# Patient Record
Sex: Female | Born: 1940
Health system: Southern US, Community
[De-identification: ages and names within clinical notes are randomized; demographics above are authoritative.]

## PROBLEM LIST (undated history)

## (undated) DIAGNOSIS — B373 Candidiasis of vulva and vagina: Secondary | ICD-10-CM

## (undated) DIAGNOSIS — G971 Other reaction to spinal and lumbar puncture: Secondary | ICD-10-CM

## (undated) DIAGNOSIS — M797 Fibromyalgia: Secondary | ICD-10-CM

## (undated) DIAGNOSIS — T7840XA Allergy, unspecified, initial encounter: Secondary | ICD-10-CM

## (undated) DIAGNOSIS — M549 Dorsalgia, unspecified: Secondary | ICD-10-CM

## (undated) DIAGNOSIS — Z8489 Family history of other specified conditions: Secondary | ICD-10-CM

## (undated) DIAGNOSIS — R351 Nocturia: Secondary | ICD-10-CM

## (undated) DIAGNOSIS — R011 Cardiac murmur, unspecified: Secondary | ICD-10-CM

## (undated) DIAGNOSIS — K219 Gastro-esophageal reflux disease without esophagitis: Secondary | ICD-10-CM

## (undated) DIAGNOSIS — E785 Hyperlipidemia, unspecified: Secondary | ICD-10-CM

## (undated) DIAGNOSIS — C189 Malignant neoplasm of colon, unspecified: Secondary | ICD-10-CM

## (undated) DIAGNOSIS — H269 Unspecified cataract: Secondary | ICD-10-CM

## (undated) DIAGNOSIS — R0602 Shortness of breath: Secondary | ICD-10-CM

## (undated) DIAGNOSIS — R350 Frequency of micturition: Secondary | ICD-10-CM

## (undated) DIAGNOSIS — J209 Acute bronchitis, unspecified: Secondary | ICD-10-CM

## (undated) DIAGNOSIS — Z8619 Personal history of other infectious and parasitic diseases: Secondary | ICD-10-CM

## (undated) DIAGNOSIS — Z86718 Personal history of other venous thrombosis and embolism: Secondary | ICD-10-CM

## (undated) DIAGNOSIS — R0600 Dyspnea, unspecified: Secondary | ICD-10-CM

## (undated) DIAGNOSIS — Z860100 Personal history of colon polyps, unspecified: Secondary | ICD-10-CM

## (undated) DIAGNOSIS — Z800 Family history of malignant neoplasm of digestive organs: Secondary | ICD-10-CM

## (undated) DIAGNOSIS — R200 Anesthesia of skin: Secondary | ICD-10-CM

## (undated) DIAGNOSIS — G4730 Sleep apnea, unspecified: Secondary | ICD-10-CM

## (undated) DIAGNOSIS — R600 Localized edema: Secondary | ICD-10-CM

## (undated) DIAGNOSIS — I10 Essential (primary) hypertension: Secondary | ICD-10-CM

## (undated) DIAGNOSIS — K279 Peptic ulcer, site unspecified, unspecified as acute or chronic, without hemorrhage or perforation: Secondary | ICD-10-CM

## (undated) DIAGNOSIS — G8929 Other chronic pain: Secondary | ICD-10-CM

## (undated) DIAGNOSIS — Z8679 Personal history of other diseases of the circulatory system: Secondary | ICD-10-CM

## (undated) DIAGNOSIS — I499 Cardiac arrhythmia, unspecified: Secondary | ICD-10-CM

## (undated) DIAGNOSIS — R21 Rash and other nonspecific skin eruption: Secondary | ICD-10-CM

## (undated) DIAGNOSIS — R42 Dizziness and giddiness: Secondary | ICD-10-CM

## (undated) DIAGNOSIS — I4891 Unspecified atrial fibrillation: Secondary | ICD-10-CM

## (undated) DIAGNOSIS — J40 Bronchitis, not specified as acute or chronic: Secondary | ICD-10-CM

## (undated) DIAGNOSIS — Z8669 Personal history of other diseases of the nervous system and sense organs: Secondary | ICD-10-CM

## (undated) DIAGNOSIS — M1990 Unspecified osteoarthritis, unspecified site: Secondary | ICD-10-CM

## (undated) DIAGNOSIS — F419 Anxiety disorder, unspecified: Secondary | ICD-10-CM

## (undated) DIAGNOSIS — R609 Edema, unspecified: Secondary | ICD-10-CM

## (undated) HISTORY — PX: TRIGGER FINGER RELEASE: SHX641

## (undated) HISTORY — PX: GANGLION CYST EXCISION: SHX1691

## (undated) HISTORY — DX: Peptic ulcer, site unspecified, unspecified as acute or chronic, without hemorrhage or perforation: K27.9

## (undated) HISTORY — PX: JOINT REPLACEMENT: SHX530

## (undated) HISTORY — DX: Unspecified atrial fibrillation: I48.91

## (undated) HISTORY — DX: Malignant neoplasm of colon, unspecified: C18.9

## (undated) HISTORY — PX: KNEE SURGERY: SHX244

## (undated) HISTORY — PX: COLONOSCOPY: SHX174

## (undated) HISTORY — PX: OTHER SURGICAL HISTORY: SHX169

## (undated) HISTORY — DX: Allergy, unspecified, initial encounter: T78.40XA

## (undated) HISTORY — DX: Unspecified cataract: H26.9

## (undated) HISTORY — PX: ANKLE SURGERY: SHX546

## (undated) HISTORY — DX: Essential (primary) hypertension: I10

## (undated) HISTORY — DX: Personal history of other diseases of the circulatory system: Z86.79

## (undated) HISTORY — DX: Acute bronchitis, unspecified: J20.9

## (undated) HISTORY — DX: Hyperlipidemia, unspecified: E78.5

## (undated) HISTORY — DX: Unspecified osteoarthritis, unspecified site: M19.90

## (undated) HISTORY — PX: CARDIAC CATHETERIZATION: SHX172

---

## 1970-02-22 HISTORY — PX: TUBAL LIGATION: SHX77

## 1990-02-22 HISTORY — PX: HEEL SPUR SURGERY: SHX665

## 1998-05-20 ENCOUNTER — Emergency Department (HOSPITAL_COMMUNITY): Admission: EM | Admit: 1998-05-20 | Discharge: 1998-05-20 | Payer: Self-pay | Admitting: Emergency Medicine

## 1998-08-18 ENCOUNTER — Encounter: Payer: Self-pay | Admitting: Internal Medicine

## 1998-08-18 ENCOUNTER — Ambulatory Visit (HOSPITAL_COMMUNITY): Admission: RE | Admit: 1998-08-18 | Discharge: 1998-08-18 | Payer: Self-pay | Admitting: Internal Medicine

## 1999-05-08 ENCOUNTER — Encounter: Payer: Self-pay | Admitting: Orthopedic Surgery

## 1999-05-08 ENCOUNTER — Encounter: Admission: RE | Admit: 1999-05-08 | Discharge: 1999-05-08 | Payer: Self-pay | Admitting: Orthopedic Surgery

## 1999-06-09 ENCOUNTER — Encounter: Admission: RE | Admit: 1999-06-09 | Discharge: 1999-06-25 | Payer: Self-pay | Admitting: Orthopedic Surgery

## 1999-10-30 ENCOUNTER — Encounter: Payer: Self-pay | Admitting: General Surgery

## 1999-11-06 ENCOUNTER — Encounter (INDEPENDENT_AMBULATORY_CARE_PROVIDER_SITE_OTHER): Payer: Self-pay | Admitting: Specialist

## 1999-11-06 ENCOUNTER — Inpatient Hospital Stay (HOSPITAL_COMMUNITY): Admission: RE | Admit: 1999-11-06 | Discharge: 1999-11-10 | Payer: Self-pay | Admitting: Orthopedic Surgery

## 1999-12-07 ENCOUNTER — Encounter: Admission: RE | Admit: 1999-12-07 | Discharge: 2000-02-11 | Payer: Self-pay | Admitting: Orthopedic Surgery

## 1999-12-08 ENCOUNTER — Emergency Department (HOSPITAL_COMMUNITY): Admission: EM | Admit: 1999-12-08 | Discharge: 1999-12-08 | Payer: Self-pay | Admitting: Emergency Medicine

## 1999-12-30 ENCOUNTER — Observation Stay (HOSPITAL_COMMUNITY): Admission: RE | Admit: 1999-12-30 | Discharge: 1999-12-31 | Payer: Self-pay | Admitting: Orthopedic Surgery

## 2001-07-03 ENCOUNTER — Emergency Department (HOSPITAL_COMMUNITY): Admission: EM | Admit: 2001-07-03 | Discharge: 2001-07-03 | Payer: Self-pay

## 2001-08-19 ENCOUNTER — Encounter: Admission: RE | Admit: 2001-08-19 | Discharge: 2001-08-19 | Payer: Self-pay | Admitting: Endocrinology

## 2001-08-19 ENCOUNTER — Encounter: Payer: Self-pay | Admitting: Endocrinology

## 2002-04-30 ENCOUNTER — Emergency Department (HOSPITAL_COMMUNITY): Admission: EM | Admit: 2002-04-30 | Discharge: 2002-05-01 | Payer: Self-pay | Admitting: Emergency Medicine

## 2002-07-11 ENCOUNTER — Observation Stay (HOSPITAL_COMMUNITY): Admission: EM | Admit: 2002-07-11 | Discharge: 2002-07-12 | Payer: Self-pay | Admitting: Emergency Medicine

## 2002-07-11 ENCOUNTER — Encounter: Payer: Self-pay | Admitting: Emergency Medicine

## 2002-07-20 ENCOUNTER — Encounter: Payer: Self-pay | Admitting: Internal Medicine

## 2002-07-20 ENCOUNTER — Encounter: Admission: RE | Admit: 2002-07-20 | Discharge: 2002-07-20 | Payer: Self-pay | Admitting: Internal Medicine

## 2002-07-26 ENCOUNTER — Encounter: Payer: Self-pay | Admitting: Internal Medicine

## 2002-07-26 ENCOUNTER — Encounter: Admission: RE | Admit: 2002-07-26 | Discharge: 2002-07-26 | Payer: Self-pay | Admitting: Internal Medicine

## 2003-08-16 ENCOUNTER — Encounter: Payer: Self-pay | Admitting: Cardiovascular Disease

## 2003-08-16 ENCOUNTER — Inpatient Hospital Stay (HOSPITAL_COMMUNITY): Admission: EM | Admit: 2003-08-16 | Discharge: 2003-08-19 | Payer: Self-pay | Admitting: Emergency Medicine

## 2004-02-20 ENCOUNTER — Other Ambulatory Visit: Admission: RE | Admit: 2004-02-20 | Discharge: 2004-02-20 | Payer: Self-pay | Admitting: Obstetrics and Gynecology

## 2004-09-21 ENCOUNTER — Ambulatory Visit: Payer: Self-pay | Admitting: Internal Medicine

## 2004-09-23 ENCOUNTER — Ambulatory Visit: Payer: Self-pay | Admitting: Internal Medicine

## 2004-09-25 ENCOUNTER — Ambulatory Visit: Payer: Self-pay | Admitting: Internal Medicine

## 2004-11-26 ENCOUNTER — Ambulatory Visit: Payer: Self-pay | Admitting: Internal Medicine

## 2005-02-22 DIAGNOSIS — C189 Malignant neoplasm of colon, unspecified: Secondary | ICD-10-CM

## 2005-02-22 HISTORY — PX: COLECTOMY: SHX59

## 2005-02-22 HISTORY — DX: Malignant neoplasm of colon, unspecified: C18.9

## 2005-03-26 ENCOUNTER — Ambulatory Visit: Payer: Self-pay | Admitting: Internal Medicine

## 2005-05-22 ENCOUNTER — Emergency Department (HOSPITAL_COMMUNITY): Admission: EM | Admit: 2005-05-22 | Discharge: 2005-05-22 | Payer: Self-pay | Admitting: Emergency Medicine

## 2005-11-01 ENCOUNTER — Ambulatory Visit: Payer: Self-pay | Admitting: Internal Medicine

## 2005-11-29 ENCOUNTER — Ambulatory Visit: Payer: Self-pay | Admitting: Internal Medicine

## 2005-12-15 ENCOUNTER — Ambulatory Visit: Payer: Self-pay | Admitting: Gastroenterology

## 2005-12-29 ENCOUNTER — Ambulatory Visit: Payer: Self-pay | Admitting: Gastroenterology

## 2005-12-29 ENCOUNTER — Encounter (INDEPENDENT_AMBULATORY_CARE_PROVIDER_SITE_OTHER): Payer: Self-pay | Admitting: Specialist

## 2006-01-10 ENCOUNTER — Ambulatory Visit: Payer: Self-pay | Admitting: Internal Medicine

## 2006-01-20 ENCOUNTER — Ambulatory Visit (HOSPITAL_COMMUNITY): Admission: RE | Admit: 2006-01-20 | Discharge: 2006-01-20 | Payer: Self-pay | Admitting: Orthopedic Surgery

## 2006-01-21 ENCOUNTER — Ambulatory Visit: Payer: Self-pay | Admitting: Internal Medicine

## 2006-02-10 ENCOUNTER — Encounter (INDEPENDENT_AMBULATORY_CARE_PROVIDER_SITE_OTHER): Payer: Self-pay | Admitting: *Deleted

## 2006-02-10 ENCOUNTER — Inpatient Hospital Stay (HOSPITAL_COMMUNITY): Admission: RE | Admit: 2006-02-10 | Discharge: 2006-02-15 | Payer: Self-pay | Admitting: Surgery

## 2006-02-19 ENCOUNTER — Emergency Department (HOSPITAL_COMMUNITY): Admission: EM | Admit: 2006-02-19 | Discharge: 2006-02-19 | Payer: Self-pay | Admitting: Emergency Medicine

## 2006-02-20 ENCOUNTER — Ambulatory Visit: Payer: Self-pay | Admitting: Hematology & Oncology

## 2006-02-22 HISTORY — PX: LUMBAR DISC SURGERY: SHX700

## 2006-02-22 HISTORY — PX: OTHER SURGICAL HISTORY: SHX169

## 2006-03-10 LAB — CBC WITH DIFFERENTIAL/PLATELET
Basophils Absolute: 0 10*3/uL (ref 0.0–0.1)
Eosinophils Absolute: 0.5 10*3/uL (ref 0.0–0.5)
HGB: 13.1 g/dL (ref 11.6–15.9)
MCV: 85.1 fL (ref 81.0–101.0)
MONO#: 0.5 10*3/uL (ref 0.1–0.9)
MONO%: 7.1 % (ref 0.0–13.0)
NEUT#: 3.7 10*3/uL (ref 1.5–6.5)
Platelets: 318 10*3/uL (ref 145–400)
RBC: 4.56 10*6/uL (ref 3.70–5.32)
RDW: 13.5 % (ref 11.3–14.5)
WBC: 7.5 10*3/uL (ref 3.9–10.0)

## 2006-03-10 LAB — COMPREHENSIVE METABOLIC PANEL
Albumin: 4.2 g/dL (ref 3.5–5.2)
Alkaline Phosphatase: 88 U/L (ref 39–117)
BUN: 10 mg/dL (ref 6–23)
CO2: 27 mEq/L (ref 19–32)
Calcium: 9.3 mg/dL (ref 8.4–10.5)
Chloride: 104 mEq/L (ref 96–112)
Glucose, Bld: 125 mg/dL — ABNORMAL HIGH (ref 70–99)
Potassium: 3.8 mEq/L (ref 3.5–5.3)
Sodium: 141 mEq/L (ref 135–145)
Total Protein: 6.4 g/dL (ref 6.0–8.3)

## 2006-03-10 LAB — CEA: CEA: 1.4 ng/mL (ref 0.0–5.0)

## 2006-03-21 ENCOUNTER — Ambulatory Visit (HOSPITAL_COMMUNITY): Admission: RE | Admit: 2006-03-21 | Discharge: 2006-03-21 | Payer: Self-pay | Admitting: Surgery

## 2006-03-24 ENCOUNTER — Ambulatory Visit (HOSPITAL_COMMUNITY): Admission: RE | Admit: 2006-03-24 | Discharge: 2006-03-24 | Payer: Self-pay | Admitting: Hematology & Oncology

## 2006-04-04 ENCOUNTER — Ambulatory Visit: Payer: Self-pay | Admitting: Hematology & Oncology

## 2006-04-06 LAB — CBC WITH DIFFERENTIAL/PLATELET
BASO%: 0.5 % (ref 0.0–2.0)
Eosinophils Absolute: 0.3 10*3/uL (ref 0.0–0.5)
LYMPH%: 31 % (ref 14.0–48.0)
MCHC: 35.4 g/dL (ref 32.0–36.0)
MONO#: 0.5 10*3/uL (ref 0.1–0.9)
NEUT#: 4.5 10*3/uL (ref 1.5–6.5)
Platelets: 249 10*3/uL (ref 145–400)
RBC: 4.31 10*6/uL (ref 3.70–5.32)
RDW: 14.4 % (ref 11.3–14.5)
WBC: 7.8 10*3/uL (ref 3.9–10.0)
lymph#: 2.4 10*3/uL (ref 0.9–3.3)

## 2006-04-06 LAB — COMPREHENSIVE METABOLIC PANEL
ALT: 37 U/L — ABNORMAL HIGH (ref 0–35)
Albumin: 3.8 g/dL (ref 3.5–5.2)
CO2: 25 mEq/L (ref 19–32)
Glucose, Bld: 230 mg/dL — ABNORMAL HIGH (ref 70–99)
Potassium: 3.5 mEq/L (ref 3.5–5.3)
Sodium: 141 mEq/L (ref 135–145)
Total Bilirubin: 0.4 mg/dL (ref 0.3–1.2)
Total Protein: 6 g/dL (ref 6.0–8.3)

## 2006-04-20 LAB — CBC WITH DIFFERENTIAL/PLATELET
BASO%: 1.1 % (ref 0.0–2.0)
LYMPH%: 35.4 % (ref 14.0–48.0)
MCHC: 34.2 g/dL (ref 32.0–36.0)
MCV: 82.8 fL (ref 81.0–101.0)
MONO#: 0.7 10*3/uL (ref 0.1–0.9)
MONO%: 9.8 % (ref 0.0–13.0)
Platelets: 154 10*3/uL (ref 145–400)
RBC: 4.68 10*6/uL (ref 3.70–5.32)
RDW: 13.4 % (ref 11.3–14.5)
WBC: 6.9 10*3/uL (ref 3.9–10.0)

## 2006-05-04 LAB — CBC WITH DIFFERENTIAL/PLATELET
BASO%: 0.8 % (ref 0.0–2.0)
MCHC: 33.3 g/dL (ref 32.0–36.0)
MONO#: 0.6 10*3/uL (ref 0.1–0.9)
NEUT#: 2.8 10*3/uL (ref 1.5–6.5)
RBC: 4.71 10*6/uL (ref 3.70–5.32)
RDW: 15.1 % — ABNORMAL HIGH (ref 11.3–14.5)
WBC: 5.8 10*3/uL (ref 3.9–10.0)
lymph#: 2.2 10*3/uL (ref 0.9–3.3)

## 2006-05-04 LAB — COMPREHENSIVE METABOLIC PANEL
ALT: 45 U/L — ABNORMAL HIGH (ref 0–35)
Albumin: 3.8 g/dL (ref 3.5–5.2)
CO2: 25 mEq/L (ref 19–32)
Calcium: 8.5 mg/dL (ref 8.4–10.5)
Chloride: 104 mEq/L (ref 96–112)
Potassium: 3.6 mEq/L (ref 3.5–5.3)
Sodium: 141 mEq/L (ref 135–145)
Total Bilirubin: 0.5 mg/dL (ref 0.3–1.2)
Total Protein: 6.1 g/dL (ref 6.0–8.3)

## 2006-05-04 LAB — CEA: CEA: 2.3 ng/mL (ref 0.0–5.0)

## 2006-05-05 ENCOUNTER — Ambulatory Visit: Payer: Self-pay | Admitting: Internal Medicine

## 2006-05-10 ENCOUNTER — Emergency Department (HOSPITAL_COMMUNITY): Admission: EM | Admit: 2006-05-10 | Discharge: 2006-05-10 | Payer: Self-pay | Admitting: Emergency Medicine

## 2006-05-18 ENCOUNTER — Ambulatory Visit: Payer: Self-pay | Admitting: Hematology & Oncology

## 2006-06-06 ENCOUNTER — Ambulatory Visit (HOSPITAL_COMMUNITY): Admission: RE | Admit: 2006-06-06 | Discharge: 2006-06-06 | Payer: Self-pay | Admitting: Interventional Radiology

## 2006-08-05 ENCOUNTER — Encounter
Admission: RE | Admit: 2006-08-05 | Discharge: 2006-08-05 | Payer: Self-pay | Admitting: Physical Medicine and Rehabilitation

## 2006-09-07 ENCOUNTER — Encounter: Admission: RE | Admit: 2006-09-07 | Discharge: 2006-09-07 | Payer: Self-pay | Admitting: Orthopedic Surgery

## 2006-10-06 ENCOUNTER — Inpatient Hospital Stay (HOSPITAL_COMMUNITY): Admission: RE | Admit: 2006-10-06 | Discharge: 2006-10-07 | Payer: Self-pay | Admitting: Orthopedic Surgery

## 2007-01-03 ENCOUNTER — Encounter: Payer: Self-pay | Admitting: Internal Medicine

## 2007-01-03 DIAGNOSIS — I10 Essential (primary) hypertension: Secondary | ICD-10-CM | POA: Insufficient documentation

## 2007-01-03 DIAGNOSIS — J45909 Unspecified asthma, uncomplicated: Secondary | ICD-10-CM

## 2007-01-03 DIAGNOSIS — N83209 Unspecified ovarian cyst, unspecified side: Secondary | ICD-10-CM

## 2007-01-03 DIAGNOSIS — G43909 Migraine, unspecified, not intractable, without status migrainosus: Secondary | ICD-10-CM | POA: Insufficient documentation

## 2007-01-03 DIAGNOSIS — K279 Peptic ulcer, site unspecified, unspecified as acute or chronic, without hemorrhage or perforation: Secondary | ICD-10-CM | POA: Insufficient documentation

## 2007-02-02 ENCOUNTER — Emergency Department (HOSPITAL_COMMUNITY): Admission: EM | Admit: 2007-02-02 | Discharge: 2007-02-02 | Payer: Self-pay | Admitting: Emergency Medicine

## 2007-02-06 ENCOUNTER — Telehealth (INDEPENDENT_AMBULATORY_CARE_PROVIDER_SITE_OTHER): Payer: Self-pay | Admitting: *Deleted

## 2007-02-07 ENCOUNTER — Ambulatory Visit: Payer: Self-pay | Admitting: Internal Medicine

## 2007-02-25 ENCOUNTER — Ambulatory Visit: Payer: Self-pay | Admitting: Family Medicine

## 2007-06-12 ENCOUNTER — Emergency Department (HOSPITAL_COMMUNITY): Admission: EM | Admit: 2007-06-12 | Discharge: 2007-06-12 | Payer: Self-pay | Admitting: Emergency Medicine

## 2007-06-14 ENCOUNTER — Telehealth: Payer: Self-pay | Admitting: Internal Medicine

## 2007-11-30 ENCOUNTER — Telehealth: Payer: Self-pay | Admitting: Internal Medicine

## 2007-12-05 ENCOUNTER — Ambulatory Visit: Payer: Self-pay | Admitting: Internal Medicine

## 2007-12-05 DIAGNOSIS — G44209 Tension-type headache, unspecified, not intractable: Secondary | ICD-10-CM

## 2007-12-05 LAB — CONVERTED CEMR LAB
BUN: 11 mg/dL (ref 6–23)
Basophils Relative: 0.3 % (ref 0.0–3.0)
CO2: 31 meq/L (ref 19–32)
Chloride: 102 meq/L (ref 96–112)
Cholesterol: 214 mg/dL (ref 0–200)
Creatinine, Ser: 0.8 mg/dL (ref 0.4–1.2)
Direct LDL: 116.2 mg/dL
GFR calc Af Amer: 93 mL/min
GFR calc non Af Amer: 77 mL/min
HDL: 51 mg/dL (ref >39.0)
Hemoglobin: 15.6 g/dL — ABNORMAL HIGH (ref 12.0–15.0)
Neutrophils Relative %: 57.5 % (ref 43.0–77.0)
Potassium: 4.4 meq/L (ref 3.5–5.1)
Sed Rate: 14 mm/hr (ref 0–22)
Triglycerides: 254 mg/dL (ref 0–149)
VLDL: 51 mg/dL — ABNORMAL HIGH (ref 0–40)

## 2007-12-10 ENCOUNTER — Encounter: Payer: Self-pay | Admitting: Internal Medicine

## 2007-12-23 ENCOUNTER — Telehealth: Payer: Self-pay | Admitting: Family Medicine

## 2007-12-23 ENCOUNTER — Ambulatory Visit: Payer: Self-pay | Admitting: Family Medicine

## 2007-12-24 ENCOUNTER — Emergency Department (HOSPITAL_COMMUNITY): Admission: EM | Admit: 2007-12-24 | Discharge: 2007-12-24 | Payer: Self-pay | Admitting: Emergency Medicine

## 2008-02-01 ENCOUNTER — Telehealth: Payer: Self-pay | Admitting: Internal Medicine

## 2008-02-14 ENCOUNTER — Emergency Department (HOSPITAL_COMMUNITY): Admission: EM | Admit: 2008-02-14 | Discharge: 2008-02-14 | Payer: Self-pay | Admitting: Emergency Medicine

## 2008-02-23 HISTORY — PX: OTHER SURGICAL HISTORY: SHX169

## 2008-05-02 ENCOUNTER — Ambulatory Visit (HOSPITAL_COMMUNITY): Admission: RE | Admit: 2008-05-02 | Discharge: 2008-05-02 | Payer: Self-pay | Admitting: Internal Medicine

## 2008-05-06 ENCOUNTER — Ambulatory Visit: Payer: Self-pay | Admitting: Internal Medicine

## 2008-05-14 ENCOUNTER — Ambulatory Visit: Payer: Self-pay | Admitting: Gastroenterology

## 2008-06-05 ENCOUNTER — Telehealth: Payer: Self-pay | Admitting: Gastroenterology

## 2008-06-07 ENCOUNTER — Encounter: Payer: Self-pay | Admitting: Gastroenterology

## 2008-06-07 ENCOUNTER — Ambulatory Visit: Payer: Self-pay | Admitting: Gastroenterology

## 2008-06-10 ENCOUNTER — Encounter: Payer: Self-pay | Admitting: Gastroenterology

## 2008-09-17 ENCOUNTER — Telehealth: Payer: Self-pay | Admitting: Internal Medicine

## 2008-09-19 ENCOUNTER — Telehealth: Payer: Self-pay | Admitting: Internal Medicine

## 2008-10-18 ENCOUNTER — Ambulatory Visit: Payer: Self-pay | Admitting: Internal Medicine

## 2008-12-10 ENCOUNTER — Telehealth: Payer: Self-pay | Admitting: Internal Medicine

## 2008-12-25 ENCOUNTER — Ambulatory Visit: Payer: Self-pay | Admitting: Internal Medicine

## 2008-12-25 DIAGNOSIS — E1169 Type 2 diabetes mellitus with other specified complication: Secondary | ICD-10-CM | POA: Insufficient documentation

## 2008-12-25 DIAGNOSIS — E785 Hyperlipidemia, unspecified: Secondary | ICD-10-CM

## 2008-12-25 DIAGNOSIS — R079 Chest pain, unspecified: Secondary | ICD-10-CM

## 2008-12-25 DIAGNOSIS — Z85038 Personal history of other malignant neoplasm of large intestine: Secondary | ICD-10-CM

## 2008-12-25 LAB — CONVERTED CEMR LAB
Alkaline Phosphatase: 82 units/L (ref 39–117)
Basophils Absolute: 0.1 10*3/uL (ref 0.0–0.1)
Chloride: 100 meq/L (ref 96–112)
Creatinine,U: 323.2 mg/dL
Eosinophils Absolute: 0.3 10*3/uL (ref 0.0–0.7)
Eosinophils Relative: 2.5 % (ref 0.0–5.0)
GFR calc non Af Amer: 66.44 mL/min (ref >60)
Glucose, Bld: 130 mg/dL — ABNORMAL HIGH (ref 70–99)
HDL: 50.8 mg/dL (ref >39.00)
Hemoglobin: 15.9 g/dL — ABNORMAL HIGH (ref 12.0–15.0)
Lymphs Abs: 3.5 10*3/uL (ref 0.7–4.0)
MCHC: 34.5 g/dL (ref 30.0–36.0)
Microalb Creat Ratio: 46.4 mg/g — ABNORMAL HIGH (ref 0.0–30.0)
Microalb, Ur: 15 mg/dL — ABNORMAL HIGH (ref 0.0–1.9)
Monocytes Absolute: 0.7 10*3/uL (ref 0.1–1.0)
Neutrophils Relative %: 58.6 % (ref 43.0–77.0)
RBC: 5.19 M/uL — ABNORMAL HIGH (ref 3.87–5.11)
RDW: 13.5 % (ref 11.5–14.6)
Sodium: 140 meq/L (ref 135–145)
Specific Gravity, Urine: 1.025 (ref 1.000–1.030)
Total Protein: 7.3 g/dL (ref 6.0–8.3)
WBC: 11 10*3/uL — ABNORMAL HIGH (ref 4.5–10.5)
pH: 6 (ref 5.0–8.0)

## 2008-12-25 LAB — HM DIABETES FOOT EXAM

## 2008-12-26 ENCOUNTER — Encounter: Payer: Self-pay | Admitting: Internal Medicine

## 2008-12-27 ENCOUNTER — Ambulatory Visit: Payer: Self-pay | Admitting: Internal Medicine

## 2008-12-27 ENCOUNTER — Observation Stay (HOSPITAL_COMMUNITY): Admission: EM | Admit: 2008-12-27 | Discharge: 2008-12-28 | Payer: Self-pay | Admitting: Internal Medicine

## 2008-12-27 DIAGNOSIS — N390 Urinary tract infection, site not specified: Secondary | ICD-10-CM | POA: Insufficient documentation

## 2009-01-21 ENCOUNTER — Telehealth: Payer: Self-pay | Admitting: Internal Medicine

## 2009-02-01 ENCOUNTER — Emergency Department (HOSPITAL_COMMUNITY): Admission: EM | Admit: 2009-02-01 | Discharge: 2009-02-01 | Payer: Self-pay | Admitting: Emergency Medicine

## 2009-02-04 ENCOUNTER — Ambulatory Visit: Payer: Self-pay | Admitting: Internal Medicine

## 2009-02-04 LAB — CONVERTED CEMR LAB
AST: 31 units/L (ref 0–37)
Albumin: 4 g/dL (ref 3.5–5.2)
Alkaline Phosphatase: 83 units/L (ref 39–117)
Total CHOL/HDL Ratio: 4
Total Protein: 6.8 g/dL (ref 6.0–8.3)
VLDL: 43.4 mg/dL — ABNORMAL HIGH (ref 0.0–40.0)

## 2009-02-06 ENCOUNTER — Encounter: Payer: Self-pay | Admitting: Internal Medicine

## 2009-02-07 ENCOUNTER — Telehealth (INDEPENDENT_AMBULATORY_CARE_PROVIDER_SITE_OTHER): Payer: Self-pay | Admitting: *Deleted

## 2009-02-12 ENCOUNTER — Encounter: Payer: Self-pay | Admitting: Internal Medicine

## 2009-02-18 ENCOUNTER — Telehealth: Payer: Self-pay | Admitting: Internal Medicine

## 2009-02-20 ENCOUNTER — Ambulatory Visit: Payer: Self-pay | Admitting: Internal Medicine

## 2009-03-07 ENCOUNTER — Telehealth: Payer: Self-pay | Admitting: Internal Medicine

## 2009-04-07 ENCOUNTER — Telehealth: Payer: Self-pay | Admitting: Internal Medicine

## 2009-04-17 ENCOUNTER — Encounter: Payer: Self-pay | Admitting: Internal Medicine

## 2009-04-22 ENCOUNTER — Telehealth: Payer: Self-pay | Admitting: Internal Medicine

## 2009-05-05 ENCOUNTER — Telehealth: Payer: Self-pay | Admitting: Internal Medicine

## 2009-05-06 ENCOUNTER — Telehealth (INDEPENDENT_AMBULATORY_CARE_PROVIDER_SITE_OTHER): Payer: Self-pay | Admitting: *Deleted

## 2009-06-05 ENCOUNTER — Inpatient Hospital Stay (HOSPITAL_COMMUNITY): Admission: EM | Admit: 2009-06-05 | Discharge: 2009-06-06 | Payer: Self-pay | Admitting: Emergency Medicine

## 2009-06-06 ENCOUNTER — Ambulatory Visit: Payer: Self-pay | Admitting: Internal Medicine

## 2009-06-06 ENCOUNTER — Encounter (INDEPENDENT_AMBULATORY_CARE_PROVIDER_SITE_OTHER): Payer: Self-pay | Admitting: *Deleted

## 2009-06-06 DIAGNOSIS — R0609 Other forms of dyspnea: Secondary | ICD-10-CM

## 2009-06-06 DIAGNOSIS — R0989 Other specified symptoms and signs involving the circulatory and respiratory systems: Secondary | ICD-10-CM

## 2009-06-08 ENCOUNTER — Encounter: Payer: Self-pay | Admitting: Internal Medicine

## 2009-06-16 ENCOUNTER — Telehealth (INDEPENDENT_AMBULATORY_CARE_PROVIDER_SITE_OTHER): Payer: Self-pay

## 2009-06-17 ENCOUNTER — Ambulatory Visit: Payer: Self-pay | Admitting: Cardiovascular Disease

## 2009-06-17 ENCOUNTER — Encounter (INDEPENDENT_AMBULATORY_CARE_PROVIDER_SITE_OTHER): Payer: Self-pay | Admitting: *Deleted

## 2009-06-17 ENCOUNTER — Encounter (HOSPITAL_COMMUNITY): Admission: RE | Admit: 2009-06-17 | Discharge: 2009-08-26 | Payer: Self-pay | Admitting: Internal Medicine

## 2009-06-17 ENCOUNTER — Ambulatory Visit: Payer: Self-pay

## 2009-06-18 ENCOUNTER — Ambulatory Visit: Payer: Self-pay

## 2009-06-23 ENCOUNTER — Telehealth: Payer: Self-pay | Admitting: Internal Medicine

## 2009-07-17 ENCOUNTER — Ambulatory Visit (HOSPITAL_BASED_OUTPATIENT_CLINIC_OR_DEPARTMENT_OTHER): Admission: RE | Admit: 2009-07-17 | Discharge: 2009-07-17 | Payer: Self-pay | Admitting: Internal Medicine

## 2009-07-17 ENCOUNTER — Encounter: Payer: Self-pay | Admitting: Internal Medicine

## 2009-07-29 ENCOUNTER — Ambulatory Visit: Payer: Self-pay | Admitting: Pulmonary Disease

## 2009-09-08 ENCOUNTER — Telehealth: Payer: Self-pay | Admitting: Internal Medicine

## 2009-09-15 ENCOUNTER — Encounter: Payer: Self-pay | Admitting: Internal Medicine

## 2009-09-22 ENCOUNTER — Ambulatory Visit: Payer: Self-pay | Admitting: Internal Medicine

## 2009-12-02 ENCOUNTER — Ambulatory Visit: Payer: Self-pay | Admitting: Internal Medicine

## 2009-12-02 DIAGNOSIS — J329 Chronic sinusitis, unspecified: Secondary | ICD-10-CM | POA: Insufficient documentation

## 2009-12-03 ENCOUNTER — Telehealth: Payer: Self-pay | Admitting: Internal Medicine

## 2009-12-25 ENCOUNTER — Telehealth: Payer: Self-pay | Admitting: Internal Medicine

## 2009-12-26 ENCOUNTER — Encounter: Payer: Self-pay | Admitting: Internal Medicine

## 2009-12-29 ENCOUNTER — Encounter: Payer: Self-pay | Admitting: Internal Medicine

## 2010-03-15 ENCOUNTER — Encounter: Payer: Self-pay | Admitting: Interventional Radiology

## 2010-03-24 NOTE — Progress Notes (Signed)
Summary: RESULTS  Phone Note Outgoing Call   Reason for Call: Discuss lab or test results Summary of Call: please call patient - normal cardiac stress test.  Thanks Initial call taken by: Jacques Navy MD,  Jun 23, 2009 5:20 AM  Follow-up for Phone Call        left mess to call office back..............Marland KitchenLamar Sprinkles, CMA  Jun 23, 2009 12:04 PM   Additional Follow-up for Phone Call Additional follow up Details #1::        lm for pt to call back Additional Follow-up by: Ami Bullins CMA,  Jun 23, 2009 3:07 PM    Additional Follow-up for Phone Call Additional follow up Details #2::    informed pt of results Follow-up by: Ami Bullins CMA,  Jun 24, 2009 9:56 AM

## 2010-03-24 NOTE — Progress Notes (Signed)
Summary: [name] request  Phone Note Refill Request Message from:  Fax from Pharmacy on April 07, 2009 4:15 PM  [name] XT 240mg  Caps #30 Sig: 1 by mouth daily **Not on patient med list  Next Appointment Scheduled: NONE Initial call taken by: Lucious Groves,  April 07, 2009 4:14 PM  Follow-up for Phone Call        our list shows dilt-xr 240 = taztia XT 240.  Ok for refill Follow-up by: Jacques Navy MD,  April 07, 2009 6:11 PM  Additional Follow-up for Phone Call Additional follow up Details #1::        done. Additional Follow-up by: Lucious Groves,  April 08, 2009 8:42 AM    Prescriptions: DILT-XR 240 MG CP24 (DILTIAZEM HCL) Take 1 tablet by mouth once a day  #30 x 6   Entered by:   Lucious Groves   Authorized by:   Jacques Navy MD   Signed by:   Lucious Groves on 04/08/2009   Method used:   [facility] [facility] [address] [phone][facility] (retail[facility]       [address]       [place], [place]  [address]       Ph: [phone]       Fax: [phone]   RxID:   [id number]

## 2010-03-24 NOTE — Progress Notes (Signed)
  Phone Note Refill Request Message from:  Fax from Pharmacy on September 08, 2009 3:24 PM  Refills Requested: Medication #1:  TRAMADOL HCL 50 MG TABS 1 q 8 hrs as needed Please Advise refill  Initial call taken by: Ami Bullins CMA,  September 08, 2009 3:25 PM  Follow-up for Phone Call        ok to refill x 5 Follow-up by: Jacques Navy MD,  September 09, 2009 7:56 AM    Prescriptions: TRAMADOL HCL 50 MG TABS (TRAMADOL HCL) 1 by mouth q 8 as needed  #60 x 5   Entered by:   Ami Bullins CMA   Authorized by:   Jacques Navy MD   Signed by:   Bill Salinas CMA on 09/09/2009   Method used:   [facility]  [address] [id number]       [address]       [place], [place]  [address]       Ph: [phone]       Fax: [phone]   RxID:   [id number]

## 2010-03-24 NOTE — Progress Notes (Signed)
  Phone Note Refill Request Message from:  Fax from Pharmacy on March 07, 2009 5:10 PM  Refills Requested: Medication #1:  AMARYL 4 MG  TABS Take 1 tablet by mouth once a day Initial call taken by: Ami Bullins CMA,  March 07, 2009 5:10 PM    Prescriptions: AMARYL 4 MG  TABS (GLIMEPIRIDE) Take 1 tablet by mouth once a day  #30 Tablet x 5   Entered by:   Ami Bullins CMA   Authorized by:   Jacques Navy MD   Signed by:   Bill Salinas CMA on 03/07/2009   Method used:   [facility]        [facility]  [address] [id number]       [address]       [place], [place]  [address]       Ph: [phone]       Fax: [phone]   RxID:   [id number]

## 2010-03-24 NOTE — Assessment & Plan Note (Signed)
Summary: sinus/chest congestion/men/cd   Vital Signs:  Patient profile:   70 year old female Height:      64 inches (162.56 cm) Weight:      255 pounds (115.91 kg) O2 Sat:      97 % on Room air Temp:     98.4 degrees F (36.89 degrees C) oral Pulse rate:   78 / minute BP sitting:   140 / 82  (left arm) Cuff size:   large  Vitals Entered By: Orlan Leavens RMA (December 02, 2009 2:31 PM)  O2 Flow:  Room air CC: sinus & chest congestion, URI symptoms Is Patient Diabetic? Yes Did you bring your meter with you today? No Pain Assessment Patient in pain? no        Primary Care Provider:  Norins  CC:  sinus & chest congestion and URI symptoms.  History of Present Illness:  URI Symptoms      This is a 70 year old woman who presents with URI symptoms.  The symptoms began 3 days ago.  The severity is described as moderate.  chronic sinus pressure and symptoms.  The patient reports nasal congestion, clear nasal discharge, sore throat, and dry cough, but denies purulent nasal discharge, productive cough, earache, and sick contacts.  Associated symptoms include wheezing.  The patient denies fever, stiff neck, dyspnea, vomiting, and diarrhea.  The patient also reports sneezing, seasonal symptoms, headache, and severe fatigue.  The patient denies response to antihistamine and muscle aches.  The patient denies the following risk factors for Strep sinusitis: tooth pain, tender adenopathy, and absence of cough.    Clinical Review Panels:  Prevention   Last Mammogram:  ASSESSMENT: Negative - BI-RADS 1^MM DIGITAL SCREENING (05/02/2008)   Last [facility]:  [facility]  Lipid Management   Cholesterol:  175 (02/04/2009)   LDL (bad choesterol):  DEL (12/05/2007)   HDL (good cholesterol):  47.70 (02/04/2009)  CBC   WBC:  11.0 (12/25/2008)   RBC:  5.19 (12/25/2008)   Hgb:  15.9 (12/25/2008)   Hct:  46.0 (12/25/2008)   Platelets:  269.0 (12/25/2008)   MCV   88.7 (12/25/2008)   MCHC  34.5 (12/25/2008)   RDW  13.5 (12/25/2008)   PMN:  58.6 (12/25/2008)   Lymphs:  31.5 (12/25/2008)   Monos:  6.8 (12/25/2008)   Eosinophils:  2.5 (12/25/2008)   Basophil:  0.6 (12/25/2008)  Complete Metabolic Panel   Glucose:  130 (12/25/2008)   Sodium:  140 (12/25/2008)   Potassium:  4.1 (12/25/2008)   Chloride:  100 (12/25/2008)   CO2:  31 (12/25/2008)   BUN:  10 (12/25/2008)   Creatinine:  0.9 (12/25/2008)   Albumin:  4.0 (02/04/2009)   Total Protein:  6.8 (02/04/2009)   Calcium:  9.4 (12/25/2008)   Total Bili:  0.7 (02/04/2009)   Alk Phos:  83 (02/04/2009)   SGPT (ALT):  30 (02/04/2009)   SGOT (AST):  31 (02/04/2009)   Current Medications (verified): 1)  Ascensia Contour Test  Strp (Glucose Blood) .... Use As Directed 2)  Ascensia Microlet  Misc (Lancets) .... As Directed 3)  Benazepril Hcl 20 Mg Tabs (Benazepril Hcl) .... Take 1 Tablet By Mouth Once A Day 4)  Dilt-Xr 240 Mg Cp24 (Diltiazem Hcl) .... Take 1 Tablet By Mouth Once A Day 5)  Furosemide 40 Mg Tabs (Furosemide) .[name].. 1 By Mouth Once Daily 6)  Amaryl 4 Mg  Tabs (Glimepiride) .... Take 1 Tablet By Mouth Once A Day  7)  Proair Hfa 108 (90 Base) Mcg/act  Aers (Albuterol Sulfate) .... Use As Directed 8)  Lorazepam 1 Mg  Tabs (Lorazepam) .... As Needed 9)  Tramadol Hcl 50 Mg Tabs (Tramadol Hcl) .[name].. 1 By Mouth Q 8 As Needed 10)  Nasonex 50 Mcg/act Susp (Mometasone Furoate) .... Use Asd 1 Spray/side Once Daily 11)  Simvastatin 20 Mg Tabs (Simvastatin) .[name].. 1 Tabs Q Pm 12)  Tramadol Hcl 50 Mg Tabs (Tramadol Hcl) .[name].. 1 Q 8 Hrs As Needed 13)  Metformin Hcl 500 Mg Tabs (Metformin Hcl) .[name].. 1 By Mouth Two Times A Day For Diabetes 14)  Promethazine-Codeine 6.25-10 Mg/53ml Syrp (Promethazine-Codeine) .[name].. 1 Tsp Q 6 As Needed Cough 15)  Promethegan 25 Mg Supp (Promethazine Hcl) .[name].. 1 Pr Every 6 Hours 16)  Colcrys 0.6 Mg Tabs (Colchicine) .... 2 Tabs Now and Take 1 Tab 21 Hr Later. May Repeat Daily As  Needed. 17)  Indomethacin 25 Mg Caps (Indomethacin) .[name].. 1 By Mouth Three Times A Day As Needed For Gout Pain  Allergies (verified): 1)  ! Procardia 2)  ! Feldene 3)  ! Morphine  Past History:  Past Medical History: Allergic rhinitis Asthma  Gout Hypertension Peptic ulcer disease History of PSVT Diabetes   Colon cancer, hx of-2007 s/p surgery and chemo.  Review of Systems  The patient denies weight loss, syncope, peripheral edema, hemoptysis, and abdominal pain.    Physical Exam  General:  alert, well-developed, well-nourished, and well-hydrated.  mildly ill, nasal voice quality Head:  Normocephalic and atraumatic without obvious abnormalities. No apparent alopecia or balding. Eyes:  vision grossly intact; pupils equal, round and reactive to light.  conjunctiva and lids normal.    Ears:  mild hazy L TM, not red - no effusion appreciated Mouth:  teeth and gums in good repair; mucous membranes moist, without lesions or ulcers. oropharynx clear without exudate, mod erythema. +PND clear Lungs:  normal respiratory effort, no intercostal retractions or use of accessory muscles; normal breath sounds bilaterally - no crackles and no wheezes.    Heart:  normal rate, regular rhythm, no murmur, and no rub. BLE without edema.   Impression & Recommendations:  Problem # 1:  UNSPECIFIED SINUSITIS (ICD-473.9)  may represent viral infx but given comorbidities of asthma, chronic sinusitus and tendency towards bronchits, tx with emperic zpack now (pt much relieved) erx done - cont nasal steroids and antitussives and f/u as needed  The following medications were removed from the medication list:    Amoxicillin-pot Clavulanate 875-125 Mg Tabs (Amoxicillin-pot clavulanate) .[name]... 1 by mouth two times a day x 10 Her updated medication list for this problem includes:    Nasonex 50 Mcg/act Susp (Mometasone furoate) ..... Use asd 1 spray/side once daily    Promethazine-codeine 6.25-10 Mg/65ml Syrp  (Promethazine-codeine) .[name]... 1 tsp q 6 as needed cough    Azithromycin 250 Mg Tabs (Azithromycin) .[name]... 2 tabs by mouth today, then 1 by mouth daily starting tomorrow  Take antibiotics for full duration. Discussed treatment options  Orders: Prescription Created Electronically [phone])  Complete Medication List: 1)  Ascensia Contour Test Strp (Glucose blood) .... Use as directed 2)  Ascensia Microlet Misc (Lancets) .... As directed 3)  Benazepril Hcl 20 Mg Tabs (Benazepril hcl) .... Take 1 tablet by mouth once a day 4)  Dilt-xr 240 Mg Cp24 (Diltiazem hcl) .... Take 1 tablet by mouth once a day 5)  Furosemide 40 Mg Tabs (Furosemide) .[name].. 1 by mouth once daily 6)  Amaryl  4 Mg Tabs (Glimepiride) .... Take 1 tablet by mouth once a day 7)  Proair Hfa 108 (90 Base) Mcg/act Aers (Albuterol sulfate) .... Use as directed 8)  Lorazepam 1 Mg Tabs (Lorazepam) .... As needed 9)  Tramadol Hcl 50 Mg Tabs (Tramadol hcl) .[name].. 1 by mouth q 8 as needed 10)  Nasonex 50 Mcg/act Susp (Mometasone furoate) .... Use asd 1 spray/side once daily 11)  Simvastatin 20 Mg Tabs (Simvastatin) .[name].. 1 tabs q pm 12)  Tramadol Hcl 50 Mg Tabs (Tramadol hcl) .[name].. 1 q 8 hrs as needed 13)  Metformin Hcl 500 Mg Tabs (Metformin hcl) .[name].. 1 by mouth two times a day for diabetes 14)  Promethazine-codeine 6.25-10 Mg/42ml Syrp (Promethazine-codeine) .[name].. 1 tsp q 6 as needed cough 15)  Promethegan 25 Mg Supp (Promethazine hcl) .[name].. 1 pr every 6 hours 16)  Colcrys 0.6 Mg Tabs (Colchicine) .... 2 tabs now and take 1 tab 21 hr later. may repeat daily as needed. 17)  Indomethacin 25 Mg Caps (Indomethacin) .[name].. 1 by mouth three times a day as needed for gout pain 18)  Azithromycin 250 Mg Tabs (Azithromycin) .... 2 tabs by mouth today, then 1 by mouth daily starting tomorrow  Patient Instructions: 1)  it was good to see you today. 2)  Zpack antibioitcs for sinus symptoms as discussed- your prescription has been electronically submitted to  your pharmacy. Please take as directed. Contact our office if you believe you're having problems with the medication(s). 3)  also continue the nasal spray and cough syrup as ongoing 4)  Get plenty of rest, drink lots of clear liquids, and use Tylenol or Ibuprofen for fever and comfort. Return in 7-10 days if you're not better:sooner if you're feeling worse. Prescriptions: ASCENSIA MICROLET  MISC (LANCETS) as directed  #100 x 12   Entered and Authorized by:   Newt Lukes MD   Signed by:   Newt Lukes MD on 12/02/2009   Method used:   [facility]        [facility]  [address] [phone]* (retail)       [address]       [place], [place]  [address]       Ph: [phone]       Fax: [phone]   RxID:   [id number] AZITHROMYCIN 250 MG TABS (AZITHROMYCIN) 2 tabs by mouth today, then 1 by mouth daily starting tomorrow  #6 x 0   Entered and Authorized by:   Newt Lukes MD   Signed by:   Newt Lukes MD on 12/02/2009   Method used:   [facility]        [facility]  [address] [phone]* (retail)       [address]       [place], [place]  [address]       Ph: [phone]       Fax: [phone]   RxID:   [id number]

## 2010-03-24 NOTE — Progress Notes (Signed)
  Phone Note Call from Patient Call back at Home Phone [phone]   Caller: Patient Call For: Jacques Navy MD Summary of Call: Pt was summoned for jury duty, she is requesting a letter so she will not have to go. Pt cannot sit/stand for long periods of time,diabetic,bad joints, and has to make frequent bathroom trips. Pt needs a letter stating she is unable to perform jury duty before Nov 19th. Please advise. Initial call taken by: Verdell Face,  December 25, 2009 3:03 PM

## 2010-03-24 NOTE — Progress Notes (Signed)
Summary: Nuc. Pre-Procedure  Phone Note Outgoing Call Call back at [facility] Phone [phone]   Call placed by: Irean Hong, RN,  June 16, 2009 2:27 PM Summary of Call: Left message with information on Myoview Information Sheet (see scanned document for details).      Nuclear Med Background Indications for Stress Test: Evaluation for Ischemia, Post Hospital  Indications Comments: Discharged 06/06/09 CP,MI ruled  out.  History: Asthma, CT/MRI, Echo, Heart Catheterization, History of Chemo, Myocardial Perfusion Study  History Comments: '00 MPS: (-) ischemia, (-) infarction, EF=73% '05 Cath: Normal coronaries, EF=60%. '05 Echo: Ef=55-65%. 4/11 CT: Pulmonary arterial HTN. Hx. PAF.  Symptoms: Chest Pain, Chest Pressure with Exertion, Diaphoresis    Nuclear Pre-Procedure Cardiac Risk Factors: Family History - CAD, History of Smoking, Hypertension, Lipids, NIDDM Height (in): 64

## 2010-03-24 NOTE — Letter (Signed)
   [facility] [address] [place], [place]  [address] Phone: [phone]      September 15, 2009   [place] [address] [place], [place] [address]  RE:  LAB RESULTS  Dear  Ms. Tostenson,  The following is an interpretation of your most recent lab tests.  Please take note of any instructions provided or changes to medications that have resulted from your lab work.    Sleep study revealed mild obstructive sleep apnea/hypopnea syndrome with an apnea index of 10 events/hour. This is not likely to represent a significant health risk. Primary treatment will be weight loss. If your symptoms become progressive you can be scheduled to see a [facility] specialist for consideration of further intervention, i.e. CPAP mask.   Please come see me if you have any questions about these lab results.   Sincerely Yours,    Jacques Navy MD

## 2010-03-24 NOTE — Letter (Signed)
Summary: [facility]   Imported By: Lester Sp[place] 04/24/2009 08:21:51  _____________________________________________________________________  External Attachment:    Type:   Image     Comment:   External Document

## 2010-03-24 NOTE — Progress Notes (Signed)
Summary: [name]  Phone Note Call from Patient Call back at [facility] Phone [phone]   Summary of Call: Patient is requesting jury excuse. She wants this b/c she is on fluid pills, pain medication and can not sit or stand for long period of time. Initial call taken by: Lamar Sprinkles, CMA,  December 03, 2009 3:50 PM  Follow-up for Phone Call        k Follow-up by: Jacques Navy MD,  December 26, 2009 6:27 PM

## 2010-03-24 NOTE — Medication Information (Signed)
[facility]   Imported By: Lennie Odor 12/30/2009 14:11:28  _____________________________________________________________________  External Attachment:    Type:   Image     Comment:   External Document

## 2010-03-24 NOTE — Letter (Signed)
Summary: [facility] - Outpatient Coins[facility] Notice  [facility]   Imported By: Marylou Mccoy 06/19/2009 16:52:49  _____________________________________________________________________  External Attachment:    Type:   Image     Comment:   External Document

## 2010-03-24 NOTE — Progress Notes (Signed)
  Phone Note Refill Request Message from:  Fax from Pharmacy on May 05, 2009 2:19 PM  Refills Requested: Medication #1:  BENAZEPRIL HCL 20 MG TABS Take 1 tablet by mouth once a day Initial call taken by: Ami Bullins CMA,  May 05, 2009 2:19 PM    Prescriptions: BENAZEPRIL HCL 20 MG TABS (BENAZEPRIL HCL) Take 1 tablet by mouth once a day  #30 Tablet x 4   Entered by:   Ami Bullins CMA   Authorized by:   Jacques Navy MD   Signed by:   Bill Salinas CMA on 05/05/2009   Method used:   [facility]ronically to        [facility]  [address] [id number]ail)       [address]       [place], [place]  [address]       Ph: [phone]       Fax: [phone]   RxID:   [id number]

## 2010-03-24 NOTE — Assessment & Plan Note (Signed)
Summary: Cardiology Nuclear Study  Nuclear Med Background Indications for Stress Test: Evaluation for Ischemia, Post Hospital  Indications Comments: Discharged 06/06/09 chest pain MI r/o  History: Asthma, CT/MRI, Echo, Heart Catheterization, History of Chemo, Myocardial Perfusion Study  History Comments: '00 LOV:FIEPPI, EF=73%; '05 Cath:Normal coronaries, EF=60%; '05 Echo:EF=55-65%; 4/11 CT: Pulmonary arterial HTN, Hx. PAF.  Symptoms: Chest Pain, Chest Pressure, Chest Pressure with Exertion, Diaphoresis, Dizziness, DOE, Fatigue, Nausea, Palpitations, Rapid HR  Symptoms Comments: Last episode of CP:3-4 days ago.   Nuclear Pre-Procedure Cardiac Risk Factors: Family History - CAD, History of Smoking, Hypertension, Lipids, NIDDM, Obesity Caffeine/Decaff Intake: None NPO After: 5:30 PM Lungs: Clear.  O2 Sat 96% on RA. IV 0.9% NS with Angio Cath: 22g     IV Site: (R) Wrist IV Started by: Irean Hong RN Chest Size (in) 44     Cup Size D     Height (in): 64 Weight (lb): 250 BMI: 43.07  Nuclear Med Study 1 or 2 day study:  2 day     Stress Test Type:  Eugenie Birks Reading MD:  Charlton Haws, MD     Referring MD:  Illene Regulus, MD Resting Radionuclide:  Technetium 76m Tetrofosmin     Resting Radionuclide Dose:  33 mCi  Stress Radionuclide:  Technetium 32m Tetrofosmin     Stress Radionuclide Dose:  33 mCi   Stress Protocol   Lexiscan: 0.4 mg   Stress Test Technologist:  [employer]a [employer] CMA-N     Nuclear Technologist:  Domenic Polite CNMT  Rest Procedure  Myocardial perfusion imaging was performed at rest 45 minutes following the intravenous administration of Myoview Technetium 40m Tetrofosmin.  Stress Procedure  The patient received IV Lexiscan 0.4 mg over 15-seconds.  Myoview injected at 30-seconds.  There were no significant changes with infusion.  Quantitative spect images were obtained after a 45 minute delay.  QPS Raw Data Images:  Normal; no motion artifact; normal heart/lung  ratio. Stress Images:  NI: Uniform and normal uptake of tracer in all myocardial segments. Rest Images:  Normal homogeneous uptake in all areas of the myocardium. Subtraction (SDS):  Normal Transient Ischemic Dilatation:  none  (Normal <1.22)  Lung/Heart Ratio:  .33  (Normal <0.45)  Quantitative Gated Spect Images QGS EDV:  90 ml QGS ESV:  31 ml QGS EF:  66 % QGS cine images:  normal  Findings Normal nuclear study      Overall Impression  Exercise Capacity: Lexiscan BP Response: Normal blood pressure response. Clinical Symptoms: Dyspnea and Headache ECG Impression: No significant ST segment change suggestive of ischemia. Overall Impression: Normal stress nuclear study.

## 2010-03-24 NOTE — Progress Notes (Signed)
        Vision Screening:      Vision Comments: Pt had eye exam 04/17/2009 with DR Eulah Pont that revealed no glaucoma, no diabetic retinopathy but did reveal cataract and Dr Eulah Pont referred pt to see Dr Charlotte Sanes for further evaluation.  Vision Entered By: Bill Salinas CMA (April 22, 2009 2:48 PM)

## 2010-03-24 NOTE — Progress Notes (Signed)
   Faxed all Cardiac over to Jodie @ [facility] to [phone] [facility]  May 06, 2009 10:45 AM

## 2010-03-24 NOTE — Letter (Signed)
Summary: [facility]  [address] [place], [place] [address]   Phone: [phone]  Fax: [phone]    December 26, 2009  Chief [address] [place]  [address]  [place], [place] [address]  [id number]  Juror: #______   Dear Milford Cage:   Basilio [place] of [place],  [place] has just informed me that she has been chosen to serve on the jury beginning the _________day of ___________, ___________.  Mrs. Rosario  is a patient under my care with the diagnosos of diabetes, Hypertension and chronic back pain. I do not feel that she should serve on the jury. I would like to request that Mrs. Weil be excused from jury duty permanently.  Your consideration of this matter is greatly appreciated.  Respectfully,   Norins MD, Rosalyn Gess  Appended Document: Jury Letter Pt aware, letter mailed to pt's home friday

## 2010-03-24 NOTE — Miscellaneous (Signed)
Summary: Orders Update   Clinical Lists Changes  Problems: Added new problem of SNORING (ICD-786.09) - Signed Orders: Added new Referral order of Sleep Disorder Referral (Sleep Disorder) - Signed Added new Referral order of Cardiolite (Cardiolite) - Signed

## 2010-03-24 NOTE — Assessment & Plan Note (Signed)
Summary: wrist pain swelling/?gout/SD   Vital Signs:  Patient profile:   70 year old female Height:      64 inches Weight:      255 pounds BMI:     43.93 O2 Sat:      96 % on Room air Temp:     97.9 degrees F oral Pulse rate:   71 / minute BP sitting:   142 / 82  (left arm) Cuff size:   large  Vitals Entered By: Bill Salinas CMA (September 22, 2009 4:46 PM)  O2 Flow:  Room air CC: pt here with c/o extreme pain in her right hand/ ab   Primary Care Provider:  Ruari Mudgett  CC:  pt here with c/o extreme pain in her right hand/ ab.  History of Present Illness: Patinet presents with an acutely painful right wrist that began to hurt 24 hrs prior to the visit. The pain is severe. She has decreased mobility of the wrist. The wrist has been hot. She has a history of gout. She denies any recent trauma or overuse. She has not responded to tramadol, icing or splinting.   Current Medications (verified): 1)  Ascensia Contour Test  Strp (Glucose Blood) .... Use As Directed 2)  Ascensia Microlet  Misc (Lancets) .... As Directed 3)  Benazepril Hcl 20 Mg Tabs (Benazepril Hcl) .... Take 1 Tablet By Mouth Once A Day 4)  Dilt-Xr 240 Mg Cp24 (Diltiazem Hcl) .... Take 1 Tablet By Mouth Once A Day 5)  Furosemide 40 Mg Tabs (Furosemide) .[name].. 1 By Mouth Once Daily 6)  Amaryl 4 Mg  Tabs (Glimepiride) .... Take 1 Tablet By Mouth Once A Day 7)  Proair Hfa 108 (90 Base) Mcg/act  Aers (Albuterol Sulfate) .... Use As Directed 8)  Lorazepam 1 Mg  Tabs (Lorazepam) .... As Needed 9)  Tramadol Hcl 50 Mg Tabs (Tramadol Hcl) .[name].. 1 By Mouth Q 8 As Needed 10)  Nasonex 50 Mcg/act Susp (Mometasone Furoate) .... Use Asd 1 Spray/side Once Daily 11)  Simvastatin 20 Mg Tabs (Simvastatin) .[name].. 1 Tabs Q Pm 12)  Tramadol Hcl 50 Mg Tabs (Tramadol Hcl) .[name].. 1 Q 8 Hrs As Needed 13)  Metformin Hcl 500 Mg Tabs (Metformin Hcl) .[name].. 1 By Mouth Two Times A Day For Diabetes 14)  Promethazine-Codeine 6.25-10 Mg/79ml Syrp  (Promethazine-Codeine) .[name].. 1 Tsp Q 6 As Needed Cough 15)  Promethegan 25 Mg Supp (Promethazine Hcl) .[name].. 1 Pr Every 6 Hours 16)  Amoxicillin-Pot Clavulanate 875-125 Mg Tabs (Amoxicillin-Pot Clavulanate) .[name].. 1 By Mouth Two Times A Day X 10  Allergies (verified): 1)  ! Procardia 2)  ! Feldene 3)  ! Morphine  Past History:  Past Medical History: Last updated: 12/25/2008 Allergic rhinitis Asthma Gout Hypertension Peptic ulcer disease History of PSVT Diabetes Colon cancer, hx of-2007 s/p surgery and chemo.  Past Surgical History: Last updated: 12/25/2008 Caesarean section x 3 Knee sx- bilat Pyelonidal cystectomy Heel spur sx x 2  Colectomy  Family History: Last updated: 01/03/2007 Family History Hypertension Family History of Cardiovascular disorder  Social History: Last updated: 12/25/2008 Former Smoker Retired Married Alcohol use-no Drug use-no Regular exercise-no  Physical Exam  General:  alert, well-developed, well-nourished, and well-hydrated.   Head:  normocephalic and atraumatic.   Eyes:  C&S clear Lungs:  normal respiratory effort.   Heart:  normal rate and regular rhythm.   Msk:  right wrist was hot, exquistely tender with decreaase ROM at the wrist and fingers due to pain.  No joint deformity was noted.  Pulses:  2+ radial Extremities:  no edema of the right wrist or hand Neurologic:  alert & oriented X3 and gait normal.   Skin:  turgor normal and no rashes.  Mild erythema at the right wrist Cervical Nodes:  no anterior cervical adenopathy and no posterior cervical adenopathy.   Psych:  Oriented X3, good eye contact, and moderately anxious.     Impression & Recommendations:  Problem # 1:  GOUT (ICD-274.9) Patient with what appears to be acute gout of the right wrist.  Plan - colchicine 1.2mg  followed by 0.6 mg 1 hr later, may repeat daily as needed           IM ketorolac 60mg  given in the office           Indomethacine 25mg  three times a day as  needed           Provided patient ed on low purine diet.   Her updated medication list for this problem includes:    Colcrys 0.6 Mg Tabs (Colchicine) .[name]... 2 tabs now and take 1 tab 21 hr later. may repeat daily as needed.  Orders: Ketorolac-Toradol 15mg  (G2952) Admin of Therapeutic Inj  intramuscular or subcutaneous (84132)  Complete Medication List: 1)  Ascensia Contour Test Strp (Glucose blood) .... Use as directed 2)  Ascensia Microlet Misc (Lancets) .... As directed 3)  Benazepril Hcl 20 Mg Tabs (Benazepril hcl) .... Take 1 tablet by mouth once a day 4)  Dilt-xr 240 Mg Cp24 (Diltiazem hcl) .... Take 1 tablet by mouth once a day 5)  Furosemide 40 Mg Tabs (Furosemide) .[name].. 1 by mouth once daily 6)  Amaryl 4 Mg Tabs (Glimepiride) .... Take 1 tablet by mouth once a day 7)  Proair Hfa 108 (90 Base) Mcg/act Aers (Albuterol sulfate) .... Use as directed 8)  Lorazepam 1 Mg Tabs (Lorazepam) .... As needed 9)  Tramadol Hcl 50 Mg Tabs (Tramadol hcl) .[name].. 1 by mouth q 8 as needed 10)  Nasonex 50 Mcg/act Susp (Mometasone furoate) .... Use asd 1 spray/side once daily 11)  Simvastatin 20 Mg Tabs (Simvastatin) .[name].. 1 tabs q pm 12)  Tramadol Hcl 50 Mg Tabs (Tramadol hcl) .[name].. 1 q 8 hrs as needed 13)  Metformin Hcl 500 Mg Tabs (Metformin hcl) .[name].. 1 by mouth two times a day for diabetes 14)  Promethazine-codeine 6.25-10 Mg/69ml Syrp (Promethazine-codeine) .[name].. 1 tsp q 6 as needed cough 15)  Promethegan 25 Mg Supp (Promethazine hcl) .[name].. 1 pr every 6 hours 16)  Amoxicillin-pot Clavulanate 875-125 Mg Tabs (Amoxicillin-pot clavulanate) .[name].. 1 by mouth two times a day x 10 17)  Colcrys 0.6 Mg Tabs (Colchicine) .... 2 tabs now and take 1 tab 21 hr later. may repeat daily as needed. 18)  Indomethacin 25 Mg Caps (Indomethacin) .[name].. 1 by mouth three times a day as needed for gout pain  Patient Instructions: 1)  gout - wrist pain with heat and decreased movement appears to be gout. Plan - ketorolac injection  in the office; colchicine 2tabs now and 1 tab in 1 hr. May repeat daily as needed for continue swelling heat and pain; indomethacine 25mg  three times a day as needed for pain; low purine diet.  Prescriptions: INDOMETHACIN 25 MG CAPS (INDOMETHACIN) 1 by mouth three times a day as needed for gout pain  #30 x 2   Entered and Authorized by:   Jacques Navy MD   Signed by:   Jacques Navy  MD on 09/22/2009   Method used:   [facility]ronically to        [facility]  [address] [id number] ([facility]       [address]       [place], [place]  [address]       Ph: [phone]       Fax: [phone]   RxID:   [id number] COLCRYS 0.6 MG TABS (COLCHICINE) 2 tabs now and take 1 tab 21 hr later. May repeat daily as needed.  #10 x 0   Entered and Authorized by:   Jacques Navy MD   Signed by:   Jacques Navy MD on 09/22/2009   Method used:   [facility]ically to        [facility]  [address] [phone]* (retail)       [address]       [place], [place]  [address]       Ph: [phone]       Fax: [phone]   RxID:   [id number]    Medication Administration  Injection # 1:    Medication: Ketorolac-Toradol 15mg     Diagnosis: GOUT (ICD-274.9)    Route: IM    Site: LUOQ gluteus    Exp Date: 06/23/2010    Lot #: [id number]    Mfr: Novartis    Comments: 60mg     Patient tolerated injection without complications    Given by: Lamar Sprinkles, CMA (September 22, 2009 5:25 PM)  Orders Added: 1)  Ketorolac-Toradol 15mg  [J1885] 2)  Admin of Therapeutic Inj  intramuscular or subcutaneous [96372] 3)  Est. Patient Level III [30160]

## 2010-04-06 ENCOUNTER — Telehealth: Payer: Self-pay | Admitting: Internal Medicine

## 2010-04-15 NOTE — Progress Notes (Signed)
Summary: REQ FOR RX  Phone Note Call from Patient Call back at [phone]   Summary of Call: Pt c/o sinus congestion, pressure & drainage. No fever, body aches, chills or cough. Mucus is yellow. She has tried otc cold meds an saline nasal spray w/no relief. Patient is requesting rx for antibiotic - she feels she has a sinus infection.   [facility]  Initial call taken by: Lamar Sprinkles, CMA,  April 06, 2010 2:52 PM  Follow-up for Phone Call        OK for z-pak. Follow-up by: Jacques Navy MD,  April 06, 2010 5:34 PM  Additional Follow-up for Phone Call Additional follow up Details #1::        Pt informed  Additional Follow-up by: Lamar Sprinkles, CMA,  April 06, 2010 6:12 PM    New/Updated Medications: ZITHROMAX Z-PAK 250 MG TABS (AZITHROMYCIN) as directed Prescriptions: ZITHROMAX Z-PAK 250 MG TABS (AZITHROMYCIN) as directed  #1 x 0   Entered by:   Lamar Sprinkles, CMA   Authorized by:   Jacques Navy MD   Signed by:   Lamar Sprinkles, CMA on 04/06/2010   Method used:   [facility] to        [facility]  [address] [phone]* (retail)       [address]       [place], [place]  [address]       Ph: [phone]       Fax: [phone]   RxID:   [id number]

## 2010-04-20 ENCOUNTER — Telehealth: Payer: Self-pay | Admitting: Internal Medicine

## 2010-04-24 ENCOUNTER — Ambulatory Visit (INDEPENDENT_AMBULATORY_CARE_PROVIDER_SITE_OTHER): Payer: Medicare Other | Admitting: Internal Medicine

## 2010-04-24 ENCOUNTER — Encounter: Payer: Self-pay | Admitting: Internal Medicine

## 2010-04-24 ENCOUNTER — Other Ambulatory Visit: Payer: Self-pay | Admitting: Internal Medicine

## 2010-04-24 DIAGNOSIS — M109 Gout, unspecified: Secondary | ICD-10-CM

## 2010-04-24 DIAGNOSIS — I10 Essential (primary) hypertension: Secondary | ICD-10-CM

## 2010-04-24 DIAGNOSIS — E785 Hyperlipidemia, unspecified: Secondary | ICD-10-CM

## 2010-04-24 DIAGNOSIS — G4733 Obstructive sleep apnea (adult) (pediatric): Secondary | ICD-10-CM | POA: Insufficient documentation

## 2010-04-24 DIAGNOSIS — E119 Type 2 diabetes mellitus without complications: Secondary | ICD-10-CM

## 2010-04-24 LAB — HEPATIC FUNCTION PANEL
AST: 39 U/L — ABNORMAL HIGH (ref 0–37)
Albumin: 4.1 g/dL (ref 3.5–5.2)
Alkaline Phosphatase: 85 U/L (ref 39–117)
Bilirubin, Direct: 0.1 mg/dL (ref 0.0–0.3)
Total Bilirubin: 0.5 mg/dL (ref 0.3–1.2)

## 2010-04-24 LAB — LDL CHOLESTEROL, DIRECT: Direct LDL: 130.6 mg/dL

## 2010-04-24 LAB — LIPID PANEL: Cholesterol: 212 mg/dL — ABNORMAL HIGH (ref 0–200)

## 2010-04-24 LAB — CONVERTED CEMR LAB: CEA: 2.2 ng/mL (ref 0.0–5.0)

## 2010-04-24 LAB — BASIC METABOLIC PANEL
Calcium: 9.5 mg/dL (ref 8.4–10.5)
Sodium: 142 mEq/L (ref 135–145)

## 2010-04-30 NOTE — Progress Notes (Signed)
Summary: EVAL?  Phone Note Call from Patient   Summary of Call: Pt left vm - see previous phone note. Continues to c/o sinus congestion and req rx for another antibiotic. Says last one "didn't work". Would you like to see pt for office visit?  Initial call taken by: Lamar Sprinkles, CMA,  April 20, 2010 11:35 AM  Follow-up for Phone Call        before a second round of antibiotic OV will be needed. Follow-up by: Jacques Navy MD,  April 20, 2010 1:22 PM  Additional Follow-up for Phone Call Additional follow up Details #1::        left detailed vm on pt's hm # to call office and schedule f/u.  Additional Follow-up by: Lamar Sprinkles, CMA,  April 20, 2010 5:27 PM

## 2010-05-05 NOTE — Assessment & Plan Note (Signed)
Summary: finished antibiotic not better,see EMR phone note--last ov 8/...   Vital Signs:  Patient profile:   70 year old female Weight:      257 pounds BMI:     44.27 O2 Sat:      96 % Temp:     97.9 degrees F Pulse rate:   74 / minute BP sitting:   178 / 90  (left arm) Cuff size:   large  Vitals Entered By: Lamar Sprinkles, CMA (April 24, 2010 10:39 AM) CC: Continued sinus complaints, completed zpak/SD Comments Did not take bp med today/SD   Primary Care Provider:  Susana Duell  CC:  Continued sinus complaints and completed zpak/SD.  History of Present Illness: [name] presents due to chronic nasal congestion. She was recently treated after a phone note with a z-pak, but this did not help. She is not getting any relief with nasonex, sudafed or nasal saline wash. She seems to have progressive obstruction through the course of the day. Congestion interferes with her ability to sleep. She no fevers, yellow thick mucus. She has seen an allergist in the past but she cannot take allergy shots - they make her sick. She saw an ENT many years ago - can't recall the surgeon but she did not have surgery.   She is miserable due to lack of sleep. she has been tested and found to have sleep apnea. Study in May '11 with AHI 10 but many flow criteria hyponeas but not by desatruation criteria. She has not had follow-up with either ENT or sleep medicine.  She reports that she has had episodes of hypotension after showering when she washed her hair, i.e. had her arms above her head.   Allergies: 1)  ! Procardia 2)  ! Feldene 3)  ! Morphine  Past History:  Past Medical History: Last updated: 12/02/2009 Allergic rhinitis Asthma  Gout Hypertension Peptic ulcer disease History of PSVT Diabetes   Colon cancer, hx of-2007 s/p surgery and chemo.  Past Surgical History: Last updated: 12/25/2008 Caesarean section x 3 Knee sx- bilat Pyelonidal cystectomy Heel spur sx x 2  Colectomy  Family  History: Last updated: 01/03/2007 Family History Hypertension Family History of Cardiovascular disorder  Social History: Last updated: 12/25/2008 Former Smoker Retired Married Alcohol use-no Drug use-no Regular exercise-no  Review of Systems  The patient denies fever, weight loss, weight gain, decreased hearing, chest pain, syncope, dyspnea on exertion, peripheral edema, hemoptysis, abdominal pain, incontinence, muscle weakness, difficulty walking, depression, and enlarged lymph nodes.    Physical Exam  General:  Obese white woman in no acute distress Head:  normocephalic, atraumatic, and no abnormalities observed.   Eyes:  vision grossly intact, pupils equal, pupils round, and corneas and lenses clear.   Ears:  EACs TMs normal Nose:  enlarged turbinates, no polyps visible with otoscope Chest Wall:  Increased A-P diameter Lungs:  normal respiratory effort, no intercostal retractions, no accessory muscle use, and normal breath sounds.   Heart:  normal rate and regular rhythm.   Msk:  normal ROM, no joint tenderness, and no joint warmth.   Pulses:  2+ radial pulse with arm down/neutral and with arm elevated above her head Neurologic:  alert & oriented X3, cranial nerves II-XII intact, and strength normal in all extremities.     Impression & Recommendations:  Problem # 1:  SLEEP APNEA, OBSTRUCTIVE (ICD-327.23)  Patient with chronic bockage of nasal passages and difficulty with breathing day and night. she has no responded to  nasal steroids, nasal wash, decongestants and she has had an abnormal sleep study.  Plan - refer to ENT to evaluate for anatomic obstruction that may be amenable to intervention.  Orders: ENT Referral (ENT)  Problem # 2:  HYPERTENSION (ICD-401.9)  Patient has not taken meds today and BP is high. She does report that she has had these hypotensive episodes with elevation of her arms that resolve with trendlenberg. Good pulses.  Plan - may need vascular  evaluation to rule out subclavian steal as cause of her symptoms.   Her updated medication list for this problem includes:    Benazepril Hcl 20 Mg Tabs (Benazepril hcl) .[name]... Take 1 tablet by mouth once a day    Dilt-xr 240 Mg Cp24 (Diltiazem hcl) .[name]... Take 1 tablet by mouth once a day    Furosemide 40 Mg Tabs (Furosemide) .[name]... 1 by mouth once daily  Orders: TLB-BMP (Basic Metabolic Panel-BMET) (80048-METABOL)  Problem # 3:  COLON CANCER, HX OF (ICD-V10.05) Current with GI.  Plan- CEA as routine monitoring.  Orders: T-CEA (46962-95284)  Addendum - CEA - 2.2 normal  Problem # 4:  DIABETES-TYPE 2 (ICD-250.00) Due for A1C fro routine monitoring.  Her updated medication list for this problem includes:    Benazepril Hcl 20 Mg Tabs (Benazepril hcl) .[name]... Take 1 tablet by mouth once a day    Amaryl 4 Mg Tabs (Glimepiride) .[name]... Take 1 tablet by mouth once a day    Metformin Hcl 500 Mg Tabs (Metformin hcl) .[name]... 1 by mouth two times a day for diabetes  Orders: TLB-A1C / Hgb A1C (Glycohemoglobin) (83036-A1C)  Addendum - A1C 8%  Plan - OV to discuss med change options.  Problem # 5:  OTHER AND UNSPECIFIED HYPERLIPIDEMIA (ICD-272.4) On simvastatin and due for follow-up lipid panel.  Her updated medication list for this problem includes:    Simvastatin 20 Mg Tabs (Simvastatin) .[name]... 1 tabs q pm  Orders: TLB-Lipid Panel (80061-LIPID) TLB-Hepatic/Liver Function Pnl (80076-HEPATIC)  Addendum - LDL 130.6 - above goal of 132 or less for diabetic (NCEP-ATP III)  Plan - OV to discuss med change for better control.  Problem # 6:  GOUT (ICD-274.9) Due for Uric acid to determine need for prophylaxis.  Her updated medication list for this problem includes:    Colcrys 0.6 Mg Tabs (Colchicine) .[name]... 2 tabs now and take 1 tab 21 hr later. may repeat daily as needed.  Orders: TLB-Uric Acid, Blood (84550-URIC)  Addendum - uric acid level is normal. No indication for  prophylaxis.  Complete Medication List: 1)  Ascensia Contour Test Strp (Glucose blood) .... Use as directed 2)  Ascensia Microlet Misc (Lancets) .... As directed 3)  Benazepril Hcl 20 Mg Tabs (Benazepril hcl) .... Take 1 tablet by mouth once a day 4)  Dilt-xr 240 Mg Cp24 (Diltiazem hcl) .... Take 1 tablet by mouth once a day 5)  Furosemide 40 Mg Tabs (Furosemide) .[name].. 1 by mouth once daily 6)  Amaryl 4 Mg Tabs (Glimepiride) .... Take 1 tablet by mouth once a day 7)  Proair Hfa 108 (90 Base) Mcg/act Aers (Albuterol sulfate) .... Use as directed 8)  Lorazepam 1 Mg Tabs (Lorazepam) .... As needed 9)  Tramadol Hcl 50 Mg Tabs (Tramadol hcl) .[name].. 1 by mouth q 8 as needed 10)  Nasonex 50 Mcg/act Susp (Mometasone furoate) .... Use asd 1 spray/side once daily 11)  Simvastatin 20 Mg Tabs (Simvastatin) .[name].. 1 tabs q pm 12)  Tramadol Hcl 50 Mg Tabs (  Tramadol hcl) .[name].. 1 q 8 hrs as needed 13)  Metformin Hcl 500 Mg Tabs (Metformin hcl) .[name].. 1 by mouth two times a day for diabetes 14)  Promethazine-codeine 6.25-10 Mg/18ml Syrp (Promethazine-codeine) .[name].. 1 tsp q 6 as needed cough 15)  Promethegan 25 Mg Supp (Promethazine hcl) .[name].. 1 pr every 6 hours 16)  Colcrys 0.6 Mg Tabs (Colchicine) .... 2 tabs now and take 1 tab 21 hr later. may repeat daily as needed. 17)  Indomethacin 25 Mg Caps (Indomethacin) .[name].. 1 by mouth three times a day as needed for gout pain   Orders Added: 1)  T-CEA [[id number]] 2)  ENT Referral [ENT] 3)  Est. Patient Level IV [81191] 4)  TLB-A1C / Hgb A1C (Glycohemoglobin) [83036-A1C] 5)  TLB-BMP (Basic Metabolic Panel-BMET) [80048-METABOL] 6)  TLB-Uric Acid, Blood [84550-URIC] 7)  TLB-Lipid Panel [80061-LIPID] 8)  TLB-Hepatic/Liver Function Pnl [80076-HEPATIC]  Patient: [name] Note: All result statuses are Final unless otherwise noted.  Tests: (1) Hemoglobin A1C (A1C)   Hemoglobin A1C       [H]  8.0 %                       4.6-6.5     Glycemic Control Guidelines for  People with Diabetes:     Non Diabetic:  <6%     Goal of Therapy: <7%     Additional Action Suggested:  >8%   Tests: (2) BMP (METABOL)   Sodium                    142 mEq/L                   135-145   Potassium                 4.4 mEq/L                   3.5-5.1   Chloride                  105 mEq/L                   96-112   Carbon Dioxide            31 mEq/L                    19-32   Glucose              [H]  114 mg/dL                   47-82   BUN                       15 mg/dL                    9-56   Creatinine                0.8 mg/dL                   2.1-3.0   Calcium                   9.5 mg/dL                   8.6-57.8   GFR  79.22 mL/min                >60.00  Tests: (3) Uric Acid (URIC)   Uric Acid                 5.5 mg/dL                   1.6-1.0  Tests: (4) Lipid Panel (LIPID)   Cholesterol          [H]  212 mg/dL                   9-604     ATP III Classification            Desirable:  < 200 mg/dL                    Borderline High:  200 - 239 mg/dL               High:  > = 240 mg/dL   Triglycerides        [H]  239.0 mg/dL                 5.4-098.1     Normal:  <150 mg/dL     Borderline High:  191 - 199 mg/dL   HDL                       47.82 mg/dL                 >95.62   VLDL Cholesterol     [H]  47.8 mg/dL                  1.3-08.6  CHO/HDL Ratio:  CHD Risk                             4                    Men          Women     1/2 Average Risk     3.4          3.3     Average Risk          5.0          4.4     2X Average Risk          9.6          7.1     3X Average Risk          15.0          11.0                           Tests: (5) Hepatic/Liver Function Panel (HEPATIC)   Total Bilirubin           0.5 mg/dL                   5.7-8.4   Direct Bilirubin          0.1 mg/dL                   6.9-6.2   Alkaline Phosphatase      85 U/L                      39-117  AST                  [H]  39 U/L                      0-37   ALT                   [H]  39 U/L                      0-35   Total Protein             6.7 g/dL                    1.6-1.0   Albumin                   4.1 g/dL                    9.6-0.4  Tests: (6) Cholesterol LDL - Direct (DIRLDL)  Cholesterol LDL - Direct                             130.6 mg/dL Tests: (1) CEA (54098)   CEA                       2.2 ng/mL                   0.0-5.0

## 2010-05-13 LAB — COMPREHENSIVE METABOLIC PANEL
ALT: 46 U/L — ABNORMAL HIGH (ref 0–35)
Albumin: 4.4 g/dL (ref 3.5–5.2)
Alkaline Phosphatase: 94 U/L (ref 39–117)
Potassium: 3.7 mEq/L (ref 3.5–5.1)
Sodium: 138 mEq/L (ref 135–145)
Total Protein: 7.5 g/dL (ref 6.0–8.3)

## 2010-05-13 LAB — CARDIAC PANEL(CRET KIN+CKTOT+MB+TROPI)
CK, MB: 1.2 ng/mL (ref 0.3–4.0)
Relative Index: INVALID (ref 0.0–2.5)
Total CK: 43 U/L (ref 7–177)
Total CK: 51 U/L (ref 7–177)
Troponin I: 0.01 ng/mL (ref 0.00–0.06)

## 2010-05-13 LAB — CBC
Platelets: 238 10*3/uL (ref 150–400)
RDW: 15.2 % (ref 11.5–15.5)

## 2010-05-13 LAB — LIPID PANEL
Cholesterol: 245 mg/dL — ABNORMAL HIGH (ref 0–200)
HDL: 60 mg/dL (ref >39)

## 2010-05-13 LAB — TSH: TSH: 3.238 u[IU]/mL (ref 0.350–4.500)

## 2010-05-13 LAB — HEMOGLOBIN A1C: Mean Plasma Glucose: 166 mg/dL — ABNORMAL HIGH (ref <117)

## 2010-05-13 LAB — PROTIME-INR: INR: 0.87 (ref 0.00–1.49)

## 2010-05-13 LAB — CK TOTAL AND CKMB (NOT AT ARMC)
CK, MB: 1.4 ng/mL (ref 0.3–4.0)
Total CK: 67 U/L (ref 7–177)

## 2010-05-13 LAB — APTT: aPTT: 29 seconds (ref 24–37)

## 2010-05-13 LAB — DIFFERENTIAL
Basophils Absolute: 0 10*3/uL (ref 0.0–0.1)
Lymphocytes Relative: 34 % (ref 12–46)
Neutro Abs: 5.1 10*3/uL (ref 1.7–7.7)
Neutrophils Relative %: 56 % (ref 43–77)

## 2010-05-13 LAB — GLUCOSE, CAPILLARY
Glucose-Capillary: 119 mg/dL — ABNORMAL HIGH (ref 70–99)
Glucose-Capillary: 152 mg/dL — ABNORMAL HIGH (ref 70–99)

## 2010-05-19 ENCOUNTER — Telehealth: Payer: Self-pay | Admitting: *Deleted

## 2010-05-19 MED ORDER — PIOGLITAZONE HCL 30 MG PO TABS: 30.0000 mg | ORAL_TABLET | Freq: Every day | ORAL | Status: DC

## 2010-05-19 MED ORDER — GLIMEPIRIDE 2 MG PO TABS: 2.0000 mg | ORAL_TABLET | Freq: Every day | ORAL | Status: DC

## 2010-05-19 NOTE — Telephone Encounter (Signed)
Sorry, pt is already on glimepride 4mg  one qd. What do you advise now?

## 2010-05-19 NOTE — Telephone Encounter (Signed)
Patient requesting alt med to Metformin. Says med causes too much stomach upset and she wants to try alternative. Please advise.

## 2010-05-19 NOTE — Telephone Encounter (Signed)
Ok for glimeperide 2 mg daily- needs to monitor CBGs and may need an increased dose after being on medication for 2 weeks. Initial Rx 30. Order done

## 2010-05-19 NOTE — Telephone Encounter (Signed)
Oops!!! Will try actos 30mg  daily, prescription done.

## 2010-05-20 ENCOUNTER — Telehealth: Payer: Self-pay | Admitting: *Deleted

## 2010-05-20 MED ORDER — GLIMEPIRIDE 4 MG PO TABS: 4.0000 mg | ORAL_TABLET | ORAL | Status: DC

## 2010-05-20 NOTE — Telephone Encounter (Signed)
That is fine - diet is the best medicine. For the record: she does not have Class III or higher heart failure so Actos is safe for her; the link to bladder cancer is very, very weak and the FDA recommendation is to not give actos to patients with bladder cancer. The risks of uncontrolled diabetes WAY outweighs the risks of the medication.

## 2010-05-20 NOTE — Telephone Encounter (Signed)
Patient informed, she will try to manage with diet and call w/any further problems.

## 2010-05-20 NOTE — Telephone Encounter (Signed)
Pt was just prescribed actos. She did some "research" and is too afraid to take the med due to her history of cancer and heart arrhythmia. Actos has warnings regarding bladder cancer and heart failure. If there are no other options pt says she will try to "fight" the diabetes with glimepiride only.

## 2010-05-20 NOTE — Telephone Encounter (Signed)
Patient informed. 

## 2010-05-26 LAB — POCT I-STAT, CHEM 8
Creatinine, Ser: 0.6 mg/dL (ref 0.4–1.2)
Glucose, Bld: 239 mg/dL — ABNORMAL HIGH (ref 70–99)
Hemoglobin: 14.6 g/dL (ref 12.0–15.0)
TCO2: 29 mmol/L (ref 0–100)

## 2010-05-26 LAB — POCT CARDIAC MARKERS: CKMB, poc: 1.8 ng/mL (ref 1.0–8.0)

## 2010-05-27 LAB — GLUCOSE, CAPILLARY: Glucose-Capillary: 129 mg/dL — ABNORMAL HIGH (ref 70–99)

## 2010-05-27 LAB — CARDIAC PANEL(CRET KIN+CKTOT+MB+TROPI)
Relative Index: INVALID (ref 0.0–2.5)
Total CK: 76 U/L (ref 7–177)
Troponin I: <0.01 ng/mL (ref 0.00–0.06)
Troponin I: <0.01 ng/mL (ref 0.00–0.06)

## 2010-05-29 ENCOUNTER — Emergency Department (HOSPITAL_COMMUNITY): Payer: Medicare Other

## 2010-05-29 ENCOUNTER — Emergency Department (HOSPITAL_COMMUNITY)
Admission: EM | Admit: 2010-05-29 | Discharge: 2010-05-29 | Disposition: A | Discharge status: Home / Self Care | Payer: Medicare Other | Attending: Emergency Medicine | Admitting: Emergency Medicine

## 2010-05-29 DIAGNOSIS — Z85038 Personal history of other malignant neoplasm of large intestine: Secondary | ICD-10-CM | POA: Insufficient documentation

## 2010-05-29 DIAGNOSIS — I10 Essential (primary) hypertension: Secondary | ICD-10-CM | POA: Insufficient documentation

## 2010-05-29 DIAGNOSIS — M436 Torticollis: Secondary | ICD-10-CM | POA: Insufficient documentation

## 2010-05-29 DIAGNOSIS — R079 Chest pain, unspecified: Secondary | ICD-10-CM | POA: Insufficient documentation

## 2010-05-29 DIAGNOSIS — M62838 Other muscle spasm: Secondary | ICD-10-CM | POA: Insufficient documentation

## 2010-05-29 DIAGNOSIS — E119 Type 2 diabetes mellitus without complications: Secondary | ICD-10-CM | POA: Insufficient documentation

## 2010-05-29 DIAGNOSIS — R51 Headache: Secondary | ICD-10-CM | POA: Insufficient documentation

## 2010-05-29 LAB — PROTIME-INR: INR: 0.98 (ref 0.00–1.49)

## 2010-05-29 LAB — DIFFERENTIAL
Basophils Absolute: 0.1 K/uL (ref 0.0–0.1)
Basophils Relative: 1 % (ref 0–1)
Eosinophils Absolute: 0.4 K/uL (ref 0.0–0.7)
Eosinophils Relative: 4 % (ref 0–5)
Lymphocytes Relative: 31 % (ref 12–46)
Lymphs Abs: 3 K/uL (ref 0.7–4.0)
Monocytes Absolute: 0.6 K/uL (ref 0.1–1.0)
Monocytes Relative: 6 % (ref 3–12)
Neutro Abs: 5.6 K/uL (ref 1.7–7.7)
Neutrophils Relative %: 58 % (ref 43–77)

## 2010-05-29 LAB — CBC
HCT: 42.5 % (ref 36.0–46.0)
Hemoglobin: 14.2 g/dL (ref 12.0–15.0)
MCH: 29.1 pg (ref 26.0–34.0)
MCHC: 33.4 g/dL (ref 30.0–36.0)

## 2010-05-29 LAB — APTT: aPTT: 31 s (ref 24–37)

## 2010-05-29 LAB — CK TOTAL AND CKMB (NOT AT ARMC)
CK, MB: 2.2 ng/mL (ref 0.3–4.0)
Total CK: 80 U/L (ref 7–177)

## 2010-05-29 LAB — BASIC METABOLIC PANEL
CO2: 25 mEq/L (ref 19–32)
GFR calc Af Amer: >60 mL/min (ref >60)
GFR calc non Af Amer: >60 mL/min (ref >60)
Glucose, Bld: 157 mg/dL — ABNORMAL HIGH (ref 70–99)
Potassium: 3.6 mEq/L (ref 3.5–5.1)
Sodium: 139 mEq/L (ref 135–145)

## 2010-05-29 LAB — TROPONIN I: Troponin I: 0.01 ng/mL (ref 0.00–0.06)

## 2010-06-03 LAB — GLUCOSE, CAPILLARY

## 2010-07-07 NOTE — Op Note (Signed)
[name], [name]                ACCOUNT NO.:  [id number]   MEDICAL RECORD NO.:  [id number]          PATIENT TYPE:  INP   LOCATION:  1614                         FACILITY:  [facility]   PHYSICIAN:  Marlowe Kays, M.D.  DATE OF BIRTH:  May 17, 1940   DATE OF PROCEDURE:  10/05/2006  DATE OF DISCHARGE:                               OPERATIVE REPORT   PREOPERATIVE DIAGNOSIS:  Spinal stenosis L3-L4 and L4-L5.   POSTOPERATIVE DIAGNOSIS:  Spinal stenosis L3-L4 and L4-L5.   OPERATION:  Central foraminal decompression L3-L4 and L4-L5.   SURGEON:  Marlowe Kays, M.D.   ASSISTANT:  Georges Lynch. Darrelyn Hillock, M.D.   ANESTHESIA:  General.   INDICATIONS FOR PROCEDURE:  She has been having back and bilateral leg  pain, right leg worse than left, with tingling in the dorsal right foot  consisting with L5 nerve root irritation. Myelogram and CT scan of September 07, 2006, demonstrates severe multifactorial spinal stenosis at L4-L5  and spinal stenosis at L3-L4, more on the right.  She had disc  abnormalities at both levels but I felt that this would further  destabilize her back which had grade 1 spondylolisthesis already of L3  on L4 and L4 on L5.   PROCEDURE:  Double dose of antibiotics. She was 5 foot 4 inches, 264  pounds. Satisfactory general anesthesia, prone position on the Wilson  frame protecting her total knee replacement. Time out performed. Back  was prepped with DuraPrep, draped in sterile field, Ioban employed.  I  made a vertical incision in the midline at approximately the L4-L5  level.  The incision was carried down to his underlying spinous  processes where the first of three lateral x-rays was taken.  Visualization was suboptimal based on her size.  I felt we had positive  identification of the L3, L4 and L5 spinous processes using Kocher  clamps and a marker.  I removed the entire spinous process and most of  the neural arch of L4, a substantial portion of L3, and a portion of the  superior L5 after placing self-retaining McCullough retractors.  After  performing preliminary bone resection with double action rongeurs, we  brought in the microscope because of the depth of the wound and fairly  constant bleeding.  Using a combination of 2 and 3 mm Kerrison rongeurs  and double action rongeur, we thoroughly decompressed her from L3 to L5.  At the conclusion of the decompression, I could place a hockey stick  easily both cephalad and caudad beneath the remaining bone and into the  neural foramina at all levels bilaterally.  Because of the constant  oozing, I elected to use 10 mL of FloSeal which seemed to work nicely  and also seemed to eliminate the need for a drain.  Gelfoam soaked in  thrombin was placed over the dura.  The self-retaining McCullough  retractors were removed and the wound was then closed in layers with  interrupted #1 Vicryl in the fascia and deep subcutaneous tissue, a  combination of 0 and 2-0 Vicryl in the superficial subcutaneous tissue,  staples in the skin.  I did leave roughly a 2 cm opening in the fascia  and deep  subcutaneous tissue caudally to allow egress of blood products.  She was  gently placed back on her PACU bed and taken to the recovery room in  satisfactory condition.  There were no known complications.  Estimated  blood loss was 900 mL, no blood replaced.           ______________________________  Marlowe Kays, M.D.     JA/MEDQ  D:  10/05/2006  T:  10/06/2006  Job:  [id number]

## 2010-07-09 ENCOUNTER — Other Ambulatory Visit: Payer: Self-pay | Admitting: Internal Medicine

## 2010-07-10 NOTE — H&P (Signed)
NAME:  [name], [name]                          ACCOUNT NO.:  [id number]   MEDICAL RECORD NO.:  [id number]                   PATIENT TYPE:  INP   LOCATION:  0365                                 FACILITY:  [facility]   PHYSICIAN:  Georgina Quint. Plotnikov, M.D. [facility]      DATE OF BIRTH:  02-11-1941   DATE OF ADMISSION:  07/11/2002  DATE OF DISCHARGE:                                HISTORY & PHYSICAL   CHIEF COMPLAINT:  Chest pain, almost passed out.   HISTORY OF PRESENT ILLNESS:  The patient is a 70 year old female who donated  plasma earlier this morning, did not have anything to eat, came home,  developed chest pressure, sweats, felt weak, almost passed out.  Called 911,  and was advised to present to the emergency room.  Her chest discomfort by  enlarge resolved at the time of her arrival.  She took nitroglycerin.  There  was no complete syncope.   PAST MEDICAL HISTORY:  1. Asthma.  2. Fibromyalgia.  3. Depression.  4. Cesarean section.  5. Left total knee replacement.  6. Hypertension.  7. Recent poison ivy dermatitis.   MEDICATIONS:  1. Prednisone, just finished for poison ivy.  2. Paxil CR 12.5 mg daily.  3. Lotensin 20 mg daily.  4. Darvocet-N 100 p.r.n.   ALLERGIES:  FELDENE.   FAMILY HISTORY:  Mother had four heart attacks in her 35's.  Siblings have  hypertension.   SOCIAL HISTORY:  Does not smoke, does not drink.  She is married.   REVIEW OF SYMPTOMS:  Denies chest pain, exertional symptoms, has occasional  headaches.  She is overweight.  Positive for fibromyalgia.  The rest is  negative.   PHYSICAL EXAMINATION:  VITAL SIGNS:  Blood pressure 135/82, heart rate 66,  respirations 20, saturation 97% on room air.  GENERAL:  She is overweight.  HEENT:  Moist mucosa.  NECK:  Supple, no thyromegaly or bruit.  CHEST:  Clear to auscultation and percussion.  No wheezes or rales.  HEART:  S1 and S2, no murmur, no gallop, no dullness to percussion.  Chest  wall nontender.  SKIN:  Clear.  Dermatitis lesions on the hands and legs.  ABDOMEN:  Lower abdomen soft and nontender.  No organomegaly or masses felt.  EXTREMITIES:  Lower extremities without edema.  NEUROLOGIC:  She is alert and oriented and cooperative.  Denies being  depressed or anxious.   LABORATORY DATA:  EKG was normal sinus rhythm.  CBC normal.  Potassium 4.4.  CK 41, troponin-I 0.02.  EKG with normal sinus rhythm.  Chest x-ray pending.   ASSESSMENT AND PLAN:  1. Chest pain with a strong family history of heart disease, rule out     myocardial infarction.  CK's x3, troponin-I x3 q.8h.  EKG in the morning.     Will not start on heparin at present.  2. Near syncope, likely vasovagal.  Rule out cardiac, telemetry.  EKG in the  morning.  Check TSH.  3. Hypertension.  Continue current therapy.  4. Fibromyalgia, continue current therapy.  5. Resolving contact dermatitis.                                               Georgina Quint. Plotnikov, M.D. [facility]    AVP/MEDQ  D:  07/11/2002  T:  07/11/2002  Job:  [id number]   cc:   Rosalyn Gess. Norins, M.D. [facility]

## 2010-07-10 NOTE — Assessment & Plan Note (Signed)
[facility]                             PRIMARY CARE OFFICE NOTE   SHALEEN, TALAMANTEZ                       MRN:          [id number]  DATE:11/02/2005                            DOB:          01/02/1941    [name] is a complex 70 year old medical patient that presents for  followup evaluation and exam.  She was last seen in the office March 26, 2005 for UTI with increased urinary frequency and urgency, also ongoing back  strain, also allergies.  Last physical date April 2006.   CHIEF COMPLAINT:  1. Diabetes.  Patient reports that her CBG's, using her husbands monitor,      have been running in the 150s.  She has been diet controlled diabetic.      Her last A1C was in 2005 at 7%.  She has been resistant to medication      interventions.  2. Lipids.  Patient's last LDL was 172.7 in August 2006.  She reports that      because of her multiple orthopedic problems she is not able to      exercise, nor has she been following a low fat diet.  3. Hypertension.  The patient's blood pressure is elevated today but she      reports not taking her medications this morning.  She reports generally      better control.  4. MSK.  Patient is having increased knee and foot pain.  She has had      surgeries in the past.  She is considering revisited to orthopedics.  5. Allergic rhinitis.  Patient is suffering from nasal congestion, says it      interferes with her sleep.  She also has rhinorrhea.  She did get good      results in the past with nasal steroid sprays.  She is followed by Dr.      Jessup Callas who has not been seen in some time.  6. Gout.  Patient had one episode of gout in the last six months.  7. Gastrointestinal.  Patient  has chronic abdominal pain and discomfort.      At this time she takes no medication for this on a regular basis.  8. Health maintenance.  Patient failed to schedule her last colonoscopy as      instructed.  Furthermore, she is  over due for mammography.   PAST MEDICAL HISTORY:   SURGEON:  1. Knee surgery bilaterally.  2. C-section x3.  3. Surgery for heel spurs x2.  4. Pyelonidal cystectomy in the past.   MEDICAL ILLNESS:  1. Ovarian cyst.  2. Hypertension.  3. Allergies and asthma.  4. Degenerative joint disease of the spine/degenerative disc disease of      the spine.  5. Osteoarthritis knees.  6. Bone spurs in the feet.  7. Chronic migraines.  8. History of PSCT.  9. Peptic ulcer disease.   CURRENT MEDICATIONS:  1. Lotensin 20 mg daily.  2. Lasix 20 mg daily.  3. Diltiazem XGT 40 mg daily.  4. Albuterol p.r.n.  5. Phenergan p.r.n.  6. Darvocet p.r.n.   FAMILY HISTORY:  Father lived to his late 32s, hypertension, cardiovascular  disease and had pacemaker placement.  Patient's mother died at age 52, she  had cardiovascular disease and hypertension.   CHART REVIEW:  1. Rhythm strips.  Patient has had Holter monitoring and was found to have      a PSVT that did not require further intervention.  2. Cardiac catheterization August 16, 2003 which was negative for the      obstructive coronary disease.  3. Last Persantine Cardiolite May 08, 1998 was unremarkable with a EF of      73%.  4. Patients last echocardiogram August 16, 2003 which revealed an ejection      fraction 55 to 65%.  Aortic valve was mildly calcified.  This was      otherwise a normal study.  5. Last mammogram October 05, 2004 normal.  6. CT angiogram chest August 17, 2003 with no PE.  7. A CT abdomen July 26, 2002 with a slightly complex 2.8 cm upper full      right renal cyst.  The liver is noted.  8. MRI of the spine June 28. 2003 with multi-level degenerative disc      disease, prominent lateral bulges at L3-4 to the right and L2-3 to the      left without discrete nerve root impingement.  Facet joint arthritis at      L3-4 extending to L4-5 with minimal spinal stenosis at L4-5.  9. Last EKG from September 25, 2004 shows sinus  bradycardia with no other      abnormality.   REVIEW OF SYSTEMS:  Patient has had no fever, sweats or chills.  She has had  no opthalmic complaints but has not had an ophthalmic exam recently. No  dental complaints.  No cardiovascular complaints. She does have cough,  shortness of breath which is chronic.  She does have frequent heartburn and  bloating and is on no medications.  No GE complaints.  Musculoskeletal as  HPI.  No dermatologic complaints.   EXAMINATION:  VITAL SIGNS:  Temperature was 97.3.  Blood pressure 161/95.  Pulse 78.  Weight 247.  GENERAL:  This is an obese Caucasian female in no acute distress.  HEENT:  Normocephalic and atraumatic.  EACs and TMS were normal.  Oropharynx  was negative dentition.  No buccal or palate lesions were noted.  Posterior  pharynx was clear.  Conjunctivae, sclerae was clear.  PERRLA, EOMI  funduscopic exam is unremarkable.  NECK:  Neck was supple without thyromegaly.  No lymphadenopathy was noted in  the cervical clavicular regions.  CHEST:  No CVA tenderness.  Lungs are clear to auscultation and percussion.  CARDIOVASCULAR:  21+ radial pulse, no JVD, no carotid bruits.  Had a quiet  precordium with regular rate and rhythm without murmurs, rubs or gallops.  BREAST:  Reveals normal skin.  Nipples without discharge.  No fixed mass  lesion or abnormality was noted.  ABDOMEN:  Obese, soft, no guarding or rebound.  No organosplenomegaly was  appreciated.  PELVIC:  Exam deferred.  EXTREMITIES:  No clubbing, cyanosis.  She has good range of motion about all  of her major joints.  There is no effusion about either knee.   The patient is sent to laboratory for A1C, lipid panel and metabolic panel.   ASSESSMENT/PLAN:  1. Diabetes.  The patient reports that her serum blood sugars at home had      been elevated.  The patient has been trying diet therapy but is not     able to exercise.  Plan:  Patient is provided with a Glucometer.  She      is  still on Amaryl 2 mg q. day.  She will have appropriate followup      with A1C in three months.  She will return to the office one month for      followup.  2. Hypertension.  Patient's blood pressure is elevated at today's visit.      She reports that she has not taken her medication today.  She is on Ace-      inhibitor, diuretic and calcium channel blocker.  Plan:  Patient is to      have followup visit to recheck her blood pressure and adjust her      medications to obtain good control.  3. Lipids.  Patient had significantly elevated LDL in the past.  She had      preferred to try lifestyle management.  I suspect given her stable      weight, difficulty with exercise and poor dietary compliance she is      still hyperlipidemic.  Plan:  Laboratory today as noted.  Patient will      be started on Simvastatin 40 mg  daily if her LDLs remains greater than      100.  4. Allergy.  The patient has a longstanding history of allergic rhinitis.      She was formally followed by Dr. Sidney Ace.  Plan:  Patient is      started on Rhinocort Aqua one spray each nostril daily.  Sample is      provided.  5. The patient with chronic problems with back pain, knee pain, foot pain.      She reports that her discomforts limiting her activities and becoming      more problematic.  Plan:  Patient does intend to return to her      orthopedist, Dr. Lequita Halt.  6. Gastrointestinal.  Patient with chronic abdominal pain and discomfort.      Patient has had a history of peptic ulcer disease in the past.  Plan:      The patient is to start a proton pump inhibitor, e.g. Prilosec OTC to      take on a regular basis.  7. Health maintenance.  The patient is encouraged to schedule colonoscopy,      referral will be forwarded to GI.  The patient is due for mammography      and she will schedule this at her convenience.   SUMMARY:  Patient with multiple medical problems outlined above.  She will  return to see me in  one month in regards to the use of Rhinocort, the  beginning of Amaryl, the start of Simvastatin.                                   Rosalyn Gess Norins, MD  MEN/MedQ  DD:  11/02/2005 DT:  11/02/2005 Job #:  [id number]   cc:   Basilio Cairo

## 2010-07-10 NOTE — H&P (Signed)
[facility]  Patient:    [name], [name]                         MRN: [id number] Adm. Date:  11/06/99 Attending:  Ollen Gross, M.D. Dictator:   Ralene Bathe, P.A. CC:         Corwin Levins, M.D. [facility]   History and Physical  DATE OF BIRTH:  [birth year].  DATE OF HISTORY AND PHYSICAL:  November 02, 1999.  CHIEF COMPLAINT:  Left knee pain.  HISTORY OF PRESENT ILLNESS:  This is a 70 year old female who has had a several-year history of progressive left knee pain.  She had no relief with the corticosteroid injection and underwent left knee arthroscopy and debridement by Dr. Lenard Galloway. [place].  At this time, she was told she had bone-on-bone changes in the patellofemoral compartment as well as medial compartment; she also had a meniscal tear.  She did not get any relief with her arthroscopy.  She also underwent a series of Hyalgan injections without relief.  She is having significant pain on a daily basis and it is affecting her ADLs.  She also is having night pain.  At this time, total knee replacement is the next step and she was seen and evaluated for a second opinion by Dr. Ollen Gross.  Upon physical exam, she was found to have marked crepitus on range of motion, medial and lateral tenderness but no ligament instability.  There was a mild varus deformity.  X-rays showed bone-on-bone changes in the medial compartment and patellofemoral narrowing; with these findings, total knee arthroplasty was indicated and she wished to proceed with Dr. Lequita Halt.  Risks and benefits were discussed in detail with patient.  She was seen and evaluated by Dr. Corwin Levins, her medical physician with the [facility], and was cleared surgically.  PAST MEDICAL HISTORY 1. Hypertension. 2. Mild asthma. 3. Anxiety. 4. Recent preop labs showed an elevated blood sugar, with no previous history    of diabetes.  PAST SURGICAL HISTORY 1. Bilateral knee  arthroscopies. 2. Removal of pilonidal cyst. 3. Removal of a right heel spur. 4. C-section x 3.  MEDICATIONS 1. Hyzaar 100/25 mg q.d. 2. Maxair p.r.n. 3. Claritin D b.i.d. 4. Over-the-counter nasal sprays.  ALLERGIES:  FELDENE causes a rash, and ALLERGY SHOTS.  SOCIAL HISTORY:  She has a history of smoking; however, quit eight years ago. Does not use alcohol.  She has a daughter who will help her postoperatively. She lives in a one-story home with one step into her usual entrance.  FAMILY HISTORY:  Significant for mother with heart disease and hypertension in mother and brother.  REVIEW OF SYSTEMS:  Patient denies any recent fevers, chills or night sweats. No bleeding tendencies.  She did have a recent sinus infection and was treated over three to four weeks ago.  CNS:  No blurred or double vision, seizures, headaches or paralysis.  RESPIRATORY:  No shortness of breath, productive cough or hemoptysis.  CARDIOVASCULAR:  No chest pain, angina or orthopnea. GI:  No nausea, vomiting, constipation, melena or bloody stools. GENITOURINARY:  No dysuria or hematuria.  MUSCULOSKELETAL:  As pertinent to present illness.  PHYSICAL EXAMINATION  GENERAL:  Patient is well-developed, well-nourished and highly anxious on exam.  She is very nervous about the upcoming surgery.  VITAL SIGNS:  Pulse 88.  Respirations 10.  Blood pressure 144/92.  HEENT:  Normocephalic.  Extraocular motions intact.  NECK:  Supple.  No lymphadenopathy and no carotid bruits appreciated on exam.  CHEST:  Clear to auscultation bilaterally.  No rales or rhonchi.  HEART:  Regular rate and rhythm.  No murmurs, gallops, rubs, heaves or thrills.  ABDOMEN:  Positive bowel sounds.  Soft and nontender.  EXTREMITIES:  As in history of present illness.  No pitting edema noted. Positive pulses.  IMPRESSION 1. Left knee osteoarthritis. 2. Mild asthma. 3. Anxiety. 4. Recent glucosuria and elevated blood sugars on  preoperative labs.  PLAN:  Patient will be admitted for left total knee arthroplasty.  I faxed her preop labs to Dr. Melvyn Novas office; I also ordered a hemoglobin A1c on her admission as well as a fasting blood sugar.  We will follow up with her elevated blood sugars.  We will go ahead and proceed with total knee arthroplasty at this point. DD:  11/03/99 TD:  11/04/99 Job: [id number]

## 2010-07-10 NOTE — H&P (Signed)
NAME:  [name], [name]                          ACCOUNT NO.:  [id number]   MEDICAL RECORD NO.:  [id number]                   PATIENT TYPE:  INP   LOCATION:  1829                                 FACILITY:  [facility]   PHYSICIAN:  Willa Rough, M.D.                  DATE OF BIRTH:  1940-12-04   DATE OF ADMISSION:  08/15/2003  DATE OF DISCHARGE:                                HISTORY & PHYSICAL   HISTORY OF PRESENT ILLNESS:  [name] is a 70 year old female who is  followed by Dr. Rosalyn Gess. Cooper for her overall medical care.  She is  disabled because of arthritic pain.  She also gives a history of  fibromyalgia.  Recently, she has felt weak.  She has also had some  headaches.  On August 15, 2003, she did have some pain between her shoulder  blades.  She was in touch with the office and was encouraged to come to the  hospital.  She felt better for a while but then when she felt worse, she  decided to take amitriptyline, which she takes sometimes for her  fibromyalgia.  Shortly thereafter, she felt worse with some nausea and  continued pain.  She also felt rapid heart beat with a choking sensation and  EMS was called.  When EMS arrived, the patient was stable.  She was  nauseated.  The EMS report says that the patient was in atrial fibrillation;  I do not have any documented strips.  The patient was transported and is now  stabilized.   PAST MEDICAL HISTORY:   ALLERGIES:  FELDENE.   MEDICATIONS:  1. Lasix 20 mg.  2. Benazepril HCl 20 mg.  3. Darvocet-N 100 q.4-6 h. for pain.  4. Amitriptyline 50 mg.  The prescription is written for 1 tab at night-     time.  The patient does not take it this frequently.   OTHER MEDICAL PROBLEMS:  See the complete list below.   SOCIAL HISTORY:  The patient is disabled.   FAMILY HISTORY:  There is a family history of coronary disease.   REVIEW OF SYSTEMS:  The patient has been having headaches.  She feels weak  in general.  She has not been  having any GI or GU symptoms.  She has her  chronic musculoskeletal discomfort.  She has noted some palpitations.  The  remainder of her review of systems is negative.   PHYSICAL EXAM:  VITAL SIGNS:  The blood pressure was 120/75 with a pulse of  66.  Respirations are 16.  Temperature is 97.6.  GENERAL:  The patient is obese and stable.  LUNGS:  Lungs are clear.  NECK:  Neck is normal, revealing no bruits.  HEENT:  HEENT reveals no marked abnormalities.  CARDIAC:  Exam reveals an S1 with an S2.  There are no clicks or significant  murmurs.  ABDOMEN:  The abdomen  is obese.  There are normal bowel sounds.  No bruits  are heard.  EXTREMITIES:  There is no significant peripheral edema.  MUSCULOSKELETAL:  There are no significant musculoskeletal deformities.   LABORATORY AND ACCESSORY CLINICAL DATA:  EKG here in the emergency room  reveals very slight J point elevation.   Chest x-ray reveals no active disease.   Hemoglobin is 14.5.  BUN 13, creatinine 0.9.  One point-of-care marker  reveals the troponin less 0.05.  D-dimer is top-normal at 0.52.  BNP is 30  and INR is 0.9.   PROBLEMS:  1. History of obesity.  2. History of a cardiac catheterization 10-15 years ago that was said to be     okay and question of an exercise tolerance test in the past 2 years that     was also said to be okay.  3. History of fibromyalgia.  This is not listed in Dr. Debby Bud' notes but she     carries this diagnosis that she tells [place] about and I do not know if this     is from Dr. Lemmie Evens of rheumatology.  4. Recent headaches.  5. Hypertension, treated.  6. Family history of coronary disease.  7. Recent feeling of weakness.  8. Presentation today of back pain, choking sensation, palpitations and     atrial fibrillation noted by emergency medical service but no strips and     not in atrial fibrillation when the patient arrived at the hospital.  9. Degenerative joint disease and disability.  10.       Feldene allergy.   At this point, we will need to rule out a cardiac basis of the pain.  Also,  we will watch her rhythm to see if there is any further atrial fibrillation.  At this point, I will not start Coumadin but keep it in mind.  Also, there  is a question as to whether or not some of the situations today could be  related to her taking the amitriptyline.   PLAN:  The plan will be to admit the patient.  We will ask primary care to  see her with [place].  More cardiac enzymes will be checked.  Two-dimensional  echocardiogram to be done.  If the patient's further cardiac enzymes are  negative, we will proceed with a Cardiolite scan in the hospital.  We will  need to do this to decide if we need to proceed with catheterization or not  in this case with the patient who has chronic pain.  It will also give [place]  further opportunity to continue to monitor her rhythm.                                                Willa Rough, M.D.    Cleotis Lema  D:  08/16/2003  T:  08/16/2003  Job:  [id number]   cc:   [name], M.D. [facility]

## 2010-07-10 NOTE — Discharge Summary (Signed)
[name], [name]                ACCOUNT NO.:  [id number]   MEDICAL RECORD NO.:  [id number]          PATIENT TYPE:  INP   LOCATION:  5736                         FACILITY:  [facility]   PHYSICIAN:  Wilmon Arms. Corliss Skains, M.D. DATE OF BIRTH:  Nov 22, 1940   DATE OF ADMISSION:  02/10/2006  DATE OF DISCHARGE:  02/15/2006                               DISCHARGE SUMMARY   ADMISSION DIAGNOSIS:  Colon cancer.   DISCHARGE DIAGNOSIS:  Colon cancer.   HISTORY OF PRESENT ILLNESS:  The patient is a 70 year old female who  underwent her first screening colonoscopy recently.  She was noted to  have a mass just above the ileocecal valve.  The biopsy showed invasive  adenocarcinoma.  Preoperative workup showed no sign of metastatic  disease.   HOSPITAL COURSE:  The patient was admitted to the hospital on February 10, 2006, where she underwent a right hemicolectomy via open approach.  The patient did quite well postoperatively.  She had a postoperative  ileus for 3 days but began passing some flatus on postoperative day #3.  Her diet was slowly advanced.  She was discharged home on February 15, 2006.   DISCHARGE INSTRUCTIONS:  She was given Vicodin p.r.n. for pain.  She  should follow up in 1 week with Dr. Corliss Skains for staple removal.   ADDENDUM:  Final pathology showed clear margins but 2/15 lymph nodes  were positive for metastatic adenocarcinoma.  [facility].      Wilmon Arms. Tsuei, M.D.  Electronically Signed     MKT/MEDQ  D:  05/11/2006  T:  05/11/2006  Job:  [id number]

## 2010-07-10 NOTE — Op Note (Signed)
[name], [name]                ACCOUNT NO.:  [id number]   MEDICAL RECORD NO.:  [id number]          PATIENT TYPE:  INP   LOCATION:  5736                         FACILITY:  [facility]   PHYSICIAN:  Wilmon Arms. Corliss Skains, M.D. DATE OF BIRTH:  February 10, 1941   DATE OF PROCEDURE:  02/10/2006  DATE OF DISCHARGE:                               OPERATIVE REPORT   PREOPERATIVE DIAGNOSIS:  Ascending colon cancer.   POSTOPERATIVE DIAGNOSIS:  Ascending colon cancer.   PROCEDURE PERFORMED:  Right hemicolectomy.   SURGEON:  Wilmon Arms. Corliss Skains, M.D.   ASSISTANT:  Currie Paris, M.D.   ANESTHESIA:  General endotracheal anesthesia.   INDICATIONS:  The patient is a 69 year old female who had never had a  previous screening colonoscopy.  Dr. Debby Bud referred her to Dr. Christella Hartigan  for a screening colonoscopy in November of this year.  The patient is  completely asymptomatic.  At the time of colonoscopy, she was found to  have a mass in the ascending colon just above the ileocecal valve.  A  biopsy showed invasive adenocarcinoma.  She is now referred for surgical  evaluation.  CT scan of the chest, abdomen, and pelvis showed no sign of  metastatic disease.  CEA level was 1.5.   DESCRIPTION OF PROCEDURE:  The patient was brought to the operating room  and placed in the supine position on the operating table.  After an  adequate level of general anesthesia was obtained, a Foley catheter was  placed under sterile technique.  The patient's abdomen was prepped with  Betadine and draped in a sterile fashion.  A time-out was taken to  assure the proper patient and the proper procedure.  A vertical midline  incision was made and dissection was carried down through a considerable  amount of adipose tissue to the fascia.  The fascia was opened above the  umbilicus in the midline.  We were able to enter the peritoneal cavity  sharply.  There were minimal adhesions.  The omentum was adherent to the  lower midline  incision where she had had previous surgery.  These  adhesions were taken down with cautery.  The Bookwalter retractor was  attached to the table and used to retract the abdominal wall.  The  patient was tilted to her left and placed in some mild Trendelenburg  position.  We examined the right colon.  A tiny palpable mass was noted  at the cecum.  There did not appear to be any surrounding inflammation  or scarring.  The liver was palpated and was negative.  There were no  gallstones.  The stomach was also normal.   We began mobilizing the right colon by dividing the lateral attachments  of the white line of Toldt with cautery.  We mobilized the colon  medially.  We continued this up around the hepatic flexure.  We  continued this to the mid transverse colon.  The omentum was dissected  off of the proximal transverse colon with cautery.  As we were  dissecting around the hepatic flexure, a significant amount of bleeding  was encountered.  This was noted to be a vein near the duodenum.  This  was controlled with clips as well as a suture ligature.  We carefully  inspected this area for further bleeding.  Hemostasis was felt to be  good.  We continued our dissection around the transverse colon.  Once we  felt we had adequate mobility, we divided the terminal ileum 10 cm  proximal to the ileocecal valve.  The LigaSure device was used to take  down the mesentery heading proximally.  We divided the transverse colon  just proximal to the middle colic artery.  The LigaSure device was used  to divide the mesentery heading proximally.  The final pedicle was the  ileocolic vessels.  This was divided between Tyler clamps and suture  ligated.  The specimen was moved to the back table where it was opened.  A 1.5 cm sessile polyp was noted at the described location just proximal  to the ileocecal valve.  The specimen was sent for pathologic  examination.   We reinspected the abdomen for hemostasis.   The abdomen was thoroughly  irrigated.  We then created our anastomosis with a stapled side-to-side  anastomosis with the terminal ileum and the transverse colon.  An  additional firing of the GIA-75 was used for the anastomosis.  The  enterotomy was closed with a TA-60 stapler.  A reinforcing suture of 3-0  silk was placed at the crotch.  The mesenteric defect was closed with a  figure-of-eight 2-0 silk suture.  We thoroughly irrigated after changing  gloves.  The fascia was closed with a double stranded #1 PDS suture  after an accurate sponge count.  The subcutaneous tissues were irrigated  and skin staples were used to close skin.  The patient was extubated and  brought to the recovery room in stable condition.  All sponge,  instrument, and needle counts were correct.      Wilmon Arms. Tsuei, M.D.  Electronically Signed     MKT/MEDQ  D:  02/10/2006  T:  02/10/2006  Job:  [id number]   cc:   Rachael Fee, MD  Rosalyn Gess. Norins, MD

## 2010-07-10 NOTE — Op Note (Signed)
[facility]  Patient:    [name], [name]                   MRN: [id number] Proc. Date: 11/06/99 Adm. Date:  [id number] Attending:  Ollen Gross V                           Operative Report  PREOPERATIVE DIAGNOSIS:  Osteoarthritis of the left knee.  POSTOPERATIVE DIAGNOSIS:  Osteoarthritis of the left knee.  PROCEDURE:  Left total knee arthroplasty.  SURGEON:  Ollen Gross, M.D.  ASSISTANT:  Alexzandrew L. Perkins, P.A.-C.  ANESTHESIA:  General.  ESTIMATED BLOOD LOSS:  Minimal.  DRAINS:  Hemovac x 1.  COMPLICATIONS:  None.  TOURNIQUET TIME:  Sixty-four minutes at 400 mmHg.  CONDITION:  Stable to recovery.  BRIEF CLINICAL NOTE:  The patient is a 70 year old female with severe osteoarthritis of the left knee.  She previously had an arthroscopy by Dr. Chaney Malling several months ago and did not have any benefit.  She has failed a series of ________ injections.  She presents now for total knee arthroplasty secondary to refractory pain.  DESCRIPTION OF PROCEDURE:  After the successful administration of a general anesthetic, a tourniquet was placed high on the left thigh and left lower extremity prepped and draped in the usual sterile fashion.  The extremity was wrapped in Esmarch was tourniquet was inflated to 350 mmHg.  Then, a midline incision was then made.  The skin was cut with a 10 blade to the subcutaneous tissue and put over extensor mechanism.  It was noted that the tourniquet was not very effective at this pressure and was again elevated, and tourniquet pressure increased to 400 which was more effective.  A fresh knife was used to make a medial parapatellar arthrotomy.  The soft tissue over the proximal tibia subperiosteally elevated to the joint line with the knife and the semimembranosus bursa with curved osteotome.  The soft tissue _________.  The tibia was also elevated with attention being paid to avoid the patellar tenon or  the tibial tubercle.  The lateral patellofemoral ligament was cut.  The patellar was everted and the knee flexed to 90 degrees.  ACL and PCL were then removed.  The drill was then used to create a starting hole in the distal femur with the alignment rod.  A 5 degree left valgus alignment and rod was placed and referencing probe off the femoral condyles.  Rotation was marked and a distal block is pinned so as to remove 9 mm off the distal femur. Distal femoral resection was made with an oscillating saw.  A sizing block was then placed in size 3 and most appropriate from a medial lateral dimension. There was at least a 3.5 in AP, and thus the block is pinned for the size 4 hole, and then a size 3 cutting block was utilized in anterior and posterior cuts.  The tibia was then subluxed anteriorly and the menisci are removed.  An extramedullary tibial alignment guide was placed and referencing proximally to the medial third of the tibial tubercle distally and along the second metatarsal and axis of tibial crest.  The block is pinned so as to remove 10 mm of a non-deficient lateral side.  Resection was made with an oscillating saw.  A size 3, the most appropriate tibia.  At this point, the chamfer intercondylar cutting block was placed on to the distal femur intercondylar chamfer cuts  were made.  The size 3 posterior stabilized femoral component with a size 3 rotating proximal tibia.  The components were then placed with a 10 mm rotating posterior stabilized spacer. The patient had full extension with excellent varus and valgus balance with excellent balance now past 90 degrees of flexion.  At this point, the patella is everted again and thickness measured to be 22. Freehand resection is taken down to 12 mm and a 35 mm patellar template is placed and the _______ were drilled.  A trial patellar was placed and it was able to track.  The proximal tibial preparation was then performed with the  Modular drill with keel punch.  The osteophytes were then removed over the posterior femur with the trial in place.  The cut bone surfaces were then prepared with pulsatile lavage and cement mix.  Once there was implantation of the tibial component, then femoral component, and patellar component were cemented in place, and the patella was held with a clamp.  A trial 10 mm insert was placed and the knee held in full extension and all extruded cement removed.  Once the cement was fully hardened, a permanent 10 mm posterior stabilizer rotating platform insert was placed and the tibial tray and the knee reduced.  Once again in full extension with excellent balance.  The wound was copiously irrigated with antibiotic solution and the extensor mechanism was closed over the wound with a Hemovac drain with interrupted #1 PDS.  The tourniquet was then released with total time of 64 minutes.  The subcutaneous was closed with interrupted 2-0 Vicryl, subcuticular with running 4-0 Monocryl.  The incision was clean and dry.  Steri-Strips and a bulky sterile dressing applied.  The drain was hooked to suction and the patient was placed in a knee immobilizer, awakened, and transported to the recovery room in stable condition. DD:  11/06/99 TD:  11/09/99 Job: [id number]

## 2010-07-10 NOTE — Discharge Summary (Signed)
[facility]  Patient:    [name], [name]                   MRN: [id number] Adm. Date:  [id number] Disc. Date: [id number] Attending:  Loanne Drilling Dictator:   Druscilla Brownie Shela Nevin, P.A. CC:         Corwin Levins, M.D. [facility]   Discharge Summary  OPERATIONS:  On November 06, 1999, the patient underwent left total knee replacement arthroplasty.  Alexzandrew L. Perkins, P.A.C., assisted.  ADMISSION DIAGNOSES: 1. Hypertension. 2. Mild asthma. 3. Anxiety. 4. Glucosuria with elevated blood sugars on preoperative laboratories.  DISCHARGE DIAGNOSES: 1. Hypertension. 2. Mild asthma. 3. Anxiety. 4. Glucosuria with elevated blood sugars on preoperative laboratories. 5. Mild postoperative anemia.  CONSULTATIONS:  None.  HISTORY OF PRESENT ILLNESS:  [age]-year-old lady with progressive left knee pain without results from any conservative care including corticosteroid injection as well as arthroscopy to the knee.  She had severe erosion of the patellofemoral as well as the medial compartment with bone-on-bone deformity. We tried halogen injections without any relief.  Her day-to-day living is markedly affected with this left knee pain, and she finds that her activities are becoming restricted.  For this reason, it was felt that she would benefit from surgical intervention and was admitted for total knee replacement arthroplasty on the left.  HOSPITAL COURSE:  The patient tolerated the surgical procedure quite well. Hemovac was removed postoperatively with no difficulties.  She was placed on Coumadin protocol postoperatively for prevention of DVT.  The wound remained dry postoperatively.  She was very active in her total knee rehabilitation program, progressing to 75 degrees with CPM.  She was ambulating in the hall with physical therapy and had achieved stair-climbing.  On the day of discharge, Dr. Lequita Halt saw the patient and thought she could  be maintained at home with home health, and arrangements were made for discharge. She was voiding well, vital signs were stable.  Hemoglobin had stabilized at 11.2.  Neurovascular was intact to the left lower extremity.  We allowed weightbearing as tolerated to the operative knee.  LABORATORY DATA:  Hematologically showed a CBC upon admission which was completely within normal limits.  Final hemoglobin was 11.2, with hematocrit of 33.6.  Electrolytes were normal, other than a slightly elevated blood sugar, repeated from 181 preoperatively to 128 on November 07, 1999. Urinalysis negative for urinary tract infection.  Chest x-ray showed "stable chest x-ray.  No active lung disease."  Electrocardiogram showed normal sinus rhythm, normal ECG.  CONDITION ON DISCHARGE:  Improved and stable.  PLAN:  The patient is discharged to her home in the care of her family.  She is to resume her home medications and diet.  FOLLOW-UP:  We would like to see her back in the clinic around the end of the week after discharge.  DISCHARGE INSTRUCTIONS:  She may continue weightbearing as tolerated with home health.  No CPM given.  Call if any problems.  DISCHARGE MEDICATIONS: 1. Percocet 5 mg #50 1-2 q.4-6h. p.r.n. pain. 2. Robaxin 500 mg #40 with a refill, 1 q.6h. p.r.n. muscle spasm. 3. Restoril 15 mg #10 with a refill, 1 q.h.s. p.r.n. sleep.  We will not    refill this medication beyond first refill. DD:  11/10/99 TD:  11/11/99 Job: [id number]

## 2010-07-10 NOTE — Discharge Summary (Signed)
NAME:  [name], [name]                          ACCOUNT NO.:  [id number]   MEDICAL RECORD NO.:  [id number]                   PATIENT TYPE:  INP   LOCATION:  3729                                 FACILITY:  [facility]   PHYSICIAN:  Charlies Constable, M.D. [facility]              DATE OF BIRTH:  06/01/1940   DATE OF ADMISSION:  08/15/2003  DATE OF DISCHARGE:  08/19/2003                           DISCHARGE SUMMARY - REFERRING   DISCHARGE DIAGNOSES:  1. Atrial fibrillation per EMS but no documentation in chart or records.     The patient has remained in normal sinus rhythm.  2. Chest pain felt to be noncardiac related.  3. Obesity.  4. Fibromyalgia.  5. Hypertension, treated.  6. Family history of coronary artery disease.  7. History of pulmonary embolus greater than 30 years ago.   HOSPITAL COURSE:  [name] is a 70 year old female who was admitted to  [facility] on August 16, 2003 after she felt upper back and chest pain.  She called our office and was told to come emergently to the emergency room.  Prior to coming to the emergency room, she began having a rapid heartbeat  and a choking sensation.  EMS was called.  EMS stated that the patient was  in atrial fibrillation; however, there are no documented strips.  The  patient was stabilized in the emergency room.  EKG revealed no acute change  and she has remained in normal sinus rhythm during our evaluation of this  patient.   Lab studies include TSH 1.337, renal function and BMET essentially normal.  Hemoglobin 13.9, hematocrit 39.9, platelets 258.  D-dimer 0.52.  Maximum CK  was 39 with 1.5 MB fraction.  Maximum troponin 0.05.  Total cholesterol 207,  triglycerides 192, LDL 116, HDL 53.  Hemoglobin A1c 7.   Because of the elevated D-dimer, the patient underwent a chest CT.  This did  not reveal evidence of a pulmonary embolism.  Eventually, the patient did  undergo cardiac catheterization on August 19, 2003 and was found to have  angiographically normal coronary arteries; however, we did add Toprol for  her blood pressure as well as presumed PAF.   The patient is discharged to home in stable condition.  Following her  cardiac catheterization, she was ambulating without difficulty.   DISCHARGE MEDICATIONS:  1. Aspirin 325 mg a day.  2. Toprol XL 50 mg once a day.  3. She is otherwise to continue her home medications which include:     a. Lasix 20 mg a day.     b. Benazepril/HCT 20 mg 1 a day.     c. Elavil 50 mg q.h.s.     d. Darvocet p.r.n. pain.  4. She may utilize Tylenol if needed.   No straining or heavy lifting for one week.  Remain on a low-fat diet.  No  tub soaking for one week.  Clean over catheterization area with  soap and  water.  No scrubbing.  Call for questions or concerns and we have made a  followup appointment for her with Dr. Debby Bud on September 02, 2003 at 3:30 p.m.      Guy Franco, P.A. [facility]                      Charlies Constable, M.D. [facility]    LB/MEDQ  D:  08/19/2003  T:  08/19/2003  Job:  [id number]   cc:   Rosalyn Gess. Norins, M.D. [facility]

## 2010-07-10 NOTE — Op Note (Signed)
[name], HYPOLITE                ACCOUNT NO.:  [id number]   MEDICAL RECORD NO.:  [id number]          PATIENT TYPE:  AMB   LOCATION:  SDS                          FACILITY:  [facility]   PHYSICIAN:  Wilmon Arms. Corliss Skains, M.D. DATE OF BIRTH:  1940-11-07   DATE OF PROCEDURE:  03/21/2006  DATE OF DISCHARGE:                               OPERATIVE REPORT   PREOP DIAGNOSIS:  Colon cancer.   POSTOPERATIVE DIAGNOSIS:  Colon cancer.   PROCEDURE PERFORMED:  Left subclavian vein Port-A-Cath placement.   SURGEON:  Wilmon Arms. Tsuei, M.D.   ANESTHESIA:  Local MAC.   INDICATIONS:  The patient is a 70 year old female who recently underwent  a right hemicolectomy for ascending colon cancer.  She had positive  lymph nodes.  She has been seen by Dr. Myna Hidalgo who wishes to initiate  chemotherapy as soon as possible.  She presents for Port-A-Cath  placement.  We discussed the benefits and risks of the procedure  including the risk of pneumothorax and hemothorax.   DESCRIPTION OF PROCEDURE:  The patient was brought to the operating room  and placed in the supine position on the operating room table with her  arms tucked and a roll behind her shoulders.  She was given intravenous  sedation.  Her left chest and neck were prepped with Betadine and draped  in a sterile fashion.  A time-out was taken to assure the proper patient  and proper procedure.  The area below the left clavicle was infiltrated  with a total of 26 mL of 0.25% Marcaine with epinephrine.  An 18 gauge  needle was inserted and was used to cannulate the subclavian vein.  The  guidewire was passed easily.  Fluoroscopy confirmed that this went down  the right side of the mediastinum in the superior vena cava.  The needle  was removed over the wire.   A subcutaneous pouch was then created through a 3 cm incision  approximately 1 inch below the insertion site.  This was enlarged with  cautery.  Once the pouch was large enough to hold the port  the catheter  was tunneled from the pouch to the insertion site.  We used a 9.6 [place]  [employer] pre attached implanted port.  The port was secured with two  interrupted 2-0 Prolene sutures.  Fluoroscopy was used to estimate the  proper length of the tubing and it was cut at 25 cm.   The patient was rotated back to the Trendelenburg position.  The  introducer as well as the breakaway sheath were then passed over the  wire through the clavipectoral fascia into the vein.  The introducer and  wire were removed and the catheter was threaded through the breakaway  sheath which was removed.  Fluoroscopy was used confirm that the tip of  the catheter was in the superior vena cava just above the right atrium.  The catheter easily withdrew and the catheter easily flushed.  There  were no kinks in the catheter.  The wound was closed with a deep layer  of 3-0 Vicryl and a subcuticular 4-0 Monocryl.  The insertion site was  marked with a pen.  Concentrated heparin was  instilled.  Steri-Strips were used to cover the wound.  2 x 2's and dry  gauze were used to dress the wound.  The patient was then moved back to  the recovery room in stable condition.  Postoperative chest x-ray is  pending.  All sponge, instrument, and needle counts were correct.      Wilmon Arms. Tsuei, M.D.  Electronically Signed     MKT/MEDQ  D:  03/21/2006  T:  03/21/2006  Job:  [id number]

## 2010-07-10 NOTE — Cardiovascular Report (Signed)
NAME:  [name], [name]                          ACCOUNT NO.:  [id number]   MEDICAL RECORD NO.:  [id number]                   PATIENT TYPE:  INP   LOCATION:  3729                                 FACILITY:  [facility]   PHYSICIAN:  Charlies Constable, M.D. [facility]              DATE OF BIRTH:  09-07-1940   DATE OF PROCEDURE:  08/19/2003  DATE OF DISCHARGE:                              CARDIAC CATHETERIZATION   CLINICAL HISTORY:  [name] is 70 years old and was admitted this weekend  by Dr. Myrtis Ser with chest pain and palpitations.  She was felt to be in  transient atrial fibrillation by EMS.  She ruled out for an myocardial  infarction and was scheduled for catheterization today.  She also has  fibromyalgia and severe degenerative arthritis.   PROCEDURE:  The procedure was performed via the right femoral artery using  arterial sheath and 6 French preformed coronary catheters. A front wall  arterial puncture was performed and Omnipaque contrast was used.  Distal  aortogram was performed to rule out renal vascular causes for hypertension.  The patient tolerated the procedure well and left the laboratory in  satisfactory condition.   RESULTS:  The aortic pressure was 165/95 with mean of 125.  Left ventricular  pressure was 165/17.   There was no left main since there were separate ostia to the LAD and  circumflex arteries.   Left anterior descending artery:  The left anterior descending artery gave  rise to a diagonal branch and three septal perforators.  These and the LAD  proper were free of significant disease.   Circumflex artery:  The circumflex artery gave rise to an atrial branch,  marginal branch and two posterior lateral branches.  These also were free of  significant disease.   Right coronary artery:  The right coronary artery gave rise to a conus  branch, right ventricular branch, posterior descending, and a posterior  lateral branch.  These vessels were free of significant disease.   LEFT VENTRICULOGRAM:  The left ventriculogram performed in the RAO  projection showed good wall motion with no areas of hypokinesis.  The  estimated ejection fraction was 60%.   DISTAL AORTOGRAM:  Distal aortogram was performed which showed patent renal  arteries and no significant aortoiliac obstruction.   CONCLUSION:  Normal coronary angiography and left ventricular wall motion.   RECOMMENDATIONS:  In view of these findings, there is no source of ischemia.  The patient apparently had paroxysmal atrial fibrillation and will add  Toprol 50 mg to her other medications, both for better control of  hypertension and for rate control and prophylaxis against paroxysmal atrial  fibrillation.  Will plan followup with Dr. Debby Bud and will see her back for  cardiology on a p.r.n. basis.  Charlies Constable, M.D. [facility]    BB/MEDQ  D:  08/19/2003  T:  08/19/2003  Job:  [id number]   cc:   Rosalyn Gess. Norins, M.D. [facility]

## 2010-07-10 NOTE — Op Note (Signed)
[facility]  Patient:    [name], [name]                   MRN: [id number] Proc. Date: 12/30/99 Adm. Date:  [id number] Attending:  Ollen Gross V                           Operative Report  PREOPERATIVE DIAGNOSIS:  Arthrofibrosis, left knee.  POSTOPERATIVE DIAGNOSIS:  Arthrofibrosis, left knee.  PROCEDURE:  Closed manipulation of left knee.  SURGEON:  Dr. Lequita Halt.  ASSISTANT:  None.  ANESTHESIA:  General.  ESTIMATED BLOOD LOSS:  None.  DRAINS:  None.  COMPLICATIONS:  None.  CONDITION:  Stable to recovery.  BRIEF CLINICAL NOTE:  Ms. Speedy is a 70 year old female who had a total knee arthroplasty done approximately 2 months ago. She did extremely well initially flexing over 90 degrees within the first week but then developed a hemarthrosis on Coumadin. She really had a major setback in flexion and now has essentially gotten frozen with a range of motion of 15 to 80. She has had extensive therapy and presents now for closed manipulation.  DESCRIPTION OF PROCEDURE:  After the successful administration of general anesthetic, the knee is bent in controlled fashion and adhesions are broken to the point where I was able to flex to 125 degrees. I was also able to get her extended to within 5 degrees of full extension. At the completion of this, the patient is awakened and transported to recovery in stable condition. DD:  12/30/99 TD:  12/31/99 Job: [id number]

## 2010-07-10 NOTE — Letter (Signed)
January 21, 2006    Wilmon Arms. Corliss Skains, M.D.  [address]  [place] [address]   RE:  DAISY, LITES  MRN:  [id number]  /  DOB:  [birth year]   Dear Dr. Corliss Skains,   Thank you very much for seeing Ms. [name] and taking care of her in  regards to her upcoming resection of her colon cancer.   The patient is cleared for surgery.   Please see my enclosed note from November 02, 2005, which fully  outlines her past medical history and also is a full and complete  physical exam.   The patient did undergo cardiac catheterization on August 16, 2003, which  was negative for any obstructive disease.  She has had previous studies,  including a Cardiolite study in May 08, 2003 and an echocardiogram on  August 16, 2003, which revealed a normal ejection fraction.  The patient  has no cardiac contraindication to surgery or general anesthesia.   In regard to her general health, she is stable for surgery.  She will  need to be on a perioperative sliding-scale insulin protocol.   We will be happy to follow this patient during her hospitalization to  help manage any medical problems should they arise.  Once again, I  appreciate your assistance in taking care of this nice patient.   Enclosure:  Note from September 11,1007.  Cardiac catheterization report August 19, 2003.    Sincerely,      Rosalyn Gess. Norins, MD  Electronically Signed    MEN/MedQ  DD: 01/21/2006  DT: 01/21/2006  Job #: [id number]

## 2010-08-07 ENCOUNTER — Other Ambulatory Visit: Payer: Self-pay | Admitting: Internal Medicine

## 2010-08-20 ENCOUNTER — Encounter: Payer: Self-pay | Admitting: Internal Medicine

## 2010-08-24 ENCOUNTER — Ambulatory Visit (INDEPENDENT_AMBULATORY_CARE_PROVIDER_SITE_OTHER): Payer: Medicare Other | Admitting: Internal Medicine

## 2010-08-24 ENCOUNTER — Other Ambulatory Visit: Payer: Self-pay | Admitting: Internal Medicine

## 2010-08-24 ENCOUNTER — Other Ambulatory Visit: Payer: Medicare Other

## 2010-08-24 ENCOUNTER — Other Ambulatory Visit (HOSPITAL_COMMUNITY)
Admission: RE | Admit: 2010-08-24 | Discharge: 2010-08-24 | Disposition: A | Discharge status: Home / Self Care | Payer: Medicare Other | Source: Ambulatory Visit | Attending: Internal Medicine | Admitting: Internal Medicine

## 2010-08-24 VITALS — BP 164/98 | HR 78 | Temp 98.1°F | Wt 253.0 lb

## 2010-08-24 DIAGNOSIS — N760 Acute vaginitis: Secondary | ICD-10-CM

## 2010-08-24 DIAGNOSIS — Z124 Encounter for screening for malignant neoplasm of cervix: Secondary | ICD-10-CM | POA: Insufficient documentation

## 2010-08-24 DIAGNOSIS — N83201 Unspecified ovarian cyst, right side: Secondary | ICD-10-CM

## 2010-08-24 DIAGNOSIS — N83209 Unspecified ovarian cyst, unspecified side: Secondary | ICD-10-CM

## 2010-08-24 LAB — WET PREP, GENITAL
Trich, Wet Prep: NONE SEEN
Yeast Wet Prep HPF POC: NONE SEEN

## 2010-08-24 MED ORDER — FLUCONAZOLE 100 MG PO TABS: 100.0000 mg | ORAL_TABLET | Freq: Every day | ORAL | Status: AC

## 2010-08-24 NOTE — Progress Notes (Signed)
  Subjective:    Patient ID: [name], female    DOB: 29-May-1940, 70 y.o.   MRN: [id number]  HPI [name] presents for evaluation of malodorous vaginal discharge and dysparunia. She has not had a pelvic with pap for greater than 6 years. She has not history of cervical disease. She is concerned due to a history of colon cancer.   Past Medical History  Diagnosis Date  . Allergic rhinitis   . Asthma   . Gout   . Hypertension   . Peptic ulcer disease   . History of PSVT (paroxysmal supraventricular tachycardia)   . Diabetes mellitus   . Colon cancer 2007    s/p surgery and chemo   Past Surgical History  Procedure Date  . Caesarean section     x 3  . Knee surgery     bilateral  . Pyelonidal cystectomy   . Heel spur surgery     x 2  . Colectomy    Family History  Problem Relation Age of Onset  . Hypertension Other   . Coronary artery disease Other    History   Social History  . Marital Status: Married    Spouse Name: N/A    Number of Children: N/A  . Years of Education: N/A   Occupational History  . retired    Social History Main Topics  . Smoking status: Former Games developer  . Smokeless tobacco: Not on file  . Alcohol Use: No  . Drug Use: No  . Sexually Active: Not on file   Other Topics Concern  . Not on file   Social History Narrative   Regular exercise- no.       Review of Systems Review of Systems  Constitutional:  Negative for fever, chills, activity change and unexpected weight change.  HEENT:  Negative for hearing loss, ear pain, congestion, neck stiffness and postnasal drip. Negative for sore throat or swallowing problems. Negative for dental complaints.   Eyes: Negative for vision loss or change in visual acuity.  Respiratory: Negative for chest tightness and wheezing.   Cardiovascular: Negative for chest pain and palpitation. No decreased exercise tolerance Gastrointestinal: No change in bowel habit. No bloating or gas. No reflux or  indigestion Genitourinary: Negative for urgency, frequency, flank pain and difficulty urinating.  Musculoskeletal: Negative for myalgias, back pain, arthralgias and gait problem.  Neurological: Negative for dizziness, tremors, weakness and headaches.  Hematological: Negative for adenopathy.  Psychiatric/Behavioral: Negative for behavioral problems and dysphoric mood.       Objective:   Physical Exam Vitals noted. Gen'l - obese white woman in no acute distress Pelvic - NEG, BUS normal, vaginal mucosa atrophic and inflammed with very friable cervix. Thick white discharge in posterior fornix and at os. PAP smear performed. Wet prep performed.       Assessment & Plan:  1. Vaginal discharge - patient with discharge and discomfort along with severe dyspareunia. Exam is consistent with yeast vaginitis. Cervix appeared normal.  Plan - wet prep pending           PAP pending           Diflucan 100 mg qd x 7 for severe yeast vaginitis  2. Ovaria cyst - patient reports that she has a been diagnosed with ovarian cysts several years ago. She is concerned due to lower quadrant abdominal pain.   Plan - transvaginal u/s.

## 2010-08-24 NOTE — Progress Notes (Signed)
Addended byRosalio Macadamia, Irvan Tiedt B on: 08/24/2010 01:39 PM   Modules accepted: Orders

## 2010-08-25 ENCOUNTER — Other Ambulatory Visit: Payer: Self-pay | Admitting: Internal Medicine

## 2010-08-25 DIAGNOSIS — Z1231 Encounter for screening mammogram for malignant neoplasm of breast: Secondary | ICD-10-CM

## 2010-08-25 NOTE — Progress Notes (Signed)
Addended by: Vernie Murders on: 08/25/2010 09:22 AM   Modules accepted: Orders

## 2010-08-27 ENCOUNTER — Telehealth: Payer: Self-pay | Admitting: Internal Medicine

## 2010-08-27 ENCOUNTER — Ambulatory Visit
Admission: RE | Admit: 2010-08-27 | Discharge: 2010-08-27 | Disposition: A | Discharge status: Home / Self Care | Payer: Medicare Other | Source: Ambulatory Visit | Attending: Internal Medicine | Admitting: Internal Medicine

## 2010-08-27 ENCOUNTER — Other Ambulatory Visit: Payer: Self-pay | Admitting: Internal Medicine

## 2010-08-27 ENCOUNTER — Ambulatory Visit (HOSPITAL_COMMUNITY): Payer: Medicare Other

## 2010-08-27 DIAGNOSIS — N83201 Unspecified ovarian cyst, right side: Secondary | ICD-10-CM

## 2010-08-27 DIAGNOSIS — N760 Acute vaginitis: Secondary | ICD-10-CM

## 2010-08-27 DIAGNOSIS — N83202 Unspecified ovarian cyst, left side: Secondary | ICD-10-CM

## 2010-08-27 MED ORDER — METRONIDAZOLE 500 MG PO TABS: 500.0000 mg | ORAL_TABLET | Freq: Two times a day (BID) | ORAL | Status: AC

## 2010-08-27 NOTE — Telephone Encounter (Signed)
Informed pt and medication sent in to [facility]

## 2010-08-27 NOTE — Telephone Encounter (Signed)
Please call: wet prep negative except for increase WBCs c/w bacterial vaginosis. Plan - flagyl 500mg  BID for 7 days/ please call in Rx

## 2010-09-01 ENCOUNTER — Telehealth: Payer: Self-pay | Admitting: *Deleted

## 2010-09-01 NOTE — Telephone Encounter (Signed)
Informed pt of results.

## 2010-09-01 NOTE — Telephone Encounter (Signed)
Message copied by [facility], Damyah Gugel B on Tue Sep 01, 2010  2:59 PM ------      Message from: Jacques Navy      Created: Sun Aug 30, 2010  7:31 PM       PAP smear negative. Pelvic with transvaginal u/s reveals a left ovarian cyst which radiology recommends rescanning in 2 months to insure there is no change. Please call the patient with this information. OV for questions

## 2010-09-09 ENCOUNTER — Emergency Department (HOSPITAL_COMMUNITY)
Admission: EM | Admit: 2010-09-09 | Discharge: 2010-09-09 | Disposition: A | Discharge status: Home / Self Care | Payer: Medicare Other | Attending: Emergency Medicine | Admitting: Emergency Medicine

## 2010-09-09 ENCOUNTER — Emergency Department (HOSPITAL_COMMUNITY): Payer: Medicare Other

## 2010-09-09 DIAGNOSIS — R079 Chest pain, unspecified: Secondary | ICD-10-CM | POA: Insufficient documentation

## 2010-09-09 DIAGNOSIS — J45909 Unspecified asthma, uncomplicated: Secondary | ICD-10-CM | POA: Insufficient documentation

## 2010-09-09 DIAGNOSIS — M25569 Pain in unspecified knee: Secondary | ICD-10-CM | POA: Insufficient documentation

## 2010-09-09 DIAGNOSIS — R002 Palpitations: Secondary | ICD-10-CM | POA: Insufficient documentation

## 2010-09-09 DIAGNOSIS — Z862 Personal history of diseases of the blood and blood-forming organs and certain disorders involving the immune mechanism: Secondary | ICD-10-CM | POA: Insufficient documentation

## 2010-09-09 DIAGNOSIS — I4891 Unspecified atrial fibrillation: Secondary | ICD-10-CM | POA: Insufficient documentation

## 2010-09-09 DIAGNOSIS — Z8639 Personal history of other endocrine, nutritional and metabolic disease: Secondary | ICD-10-CM | POA: Insufficient documentation

## 2010-09-09 DIAGNOSIS — R000 Tachycardia, unspecified: Secondary | ICD-10-CM | POA: Insufficient documentation

## 2010-09-09 DIAGNOSIS — R0789 Other chest pain: Secondary | ICD-10-CM

## 2010-09-09 DIAGNOSIS — Z79899 Other long term (current) drug therapy: Secondary | ICD-10-CM | POA: Insufficient documentation

## 2010-09-09 DIAGNOSIS — I10 Essential (primary) hypertension: Secondary | ICD-10-CM | POA: Insufficient documentation

## 2010-09-09 DIAGNOSIS — M79609 Pain in unspecified limb: Secondary | ICD-10-CM | POA: Insufficient documentation

## 2010-09-09 DIAGNOSIS — E119 Type 2 diabetes mellitus without complications: Secondary | ICD-10-CM | POA: Insufficient documentation

## 2010-09-09 LAB — POCT I-STAT, CHEM 8
BUN: 8 mg/dL (ref 6–23)
Calcium, Ion: 1.12 mmol/L (ref 1.12–1.32)
Chloride: 103 mEq/L (ref 96–112)
Creatinine, Ser: 0.6 mg/dL (ref 0.50–1.10)
Glucose, Bld: 141 mg/dL — ABNORMAL HIGH (ref 70–99)
Potassium: 3.9 mEq/L (ref 3.5–5.1)

## 2010-09-09 LAB — TROPONIN I: Troponin I: <0.3 ng/mL (ref <0.30)

## 2010-09-09 LAB — CK TOTAL AND CKMB (NOT AT ARMC): Relative Index: INVALID (ref 0.0–2.5)

## 2010-09-09 LAB — DIFFERENTIAL
Eosinophils Absolute: 0.3 10*3/uL (ref 0.0–0.7)
Eosinophils Relative: 3 % (ref 0–5)
Lymphocytes Relative: 34 % (ref 12–46)
Lymphs Abs: 3.9 10*3/uL (ref 0.7–4.0)
Monocytes Relative: 7 % (ref 3–12)

## 2010-09-09 LAB — CBC
HCT: 46.1 % — ABNORMAL HIGH (ref 36.0–46.0)
MCH: 30.2 pg (ref 26.0–34.0)
MCV: 85.5 fL (ref 78.0–100.0)
Platelets: 236 10*3/uL (ref 150–400)
RDW: 14 % (ref 11.5–15.5)

## 2010-09-10 ENCOUNTER — Telehealth: Payer: Self-pay | Admitting: Cardiology

## 2010-09-10 DIAGNOSIS — I4891 Unspecified atrial fibrillation: Secondary | ICD-10-CM

## 2010-09-10 NOTE — Telephone Encounter (Signed)
Order put in for echocardiogram.

## 2010-09-16 ENCOUNTER — Encounter: Payer: Self-pay | Admitting: Cardiology

## 2010-09-16 ENCOUNTER — Ambulatory Visit (HOSPITAL_COMMUNITY): Payer: Medicare Other | Attending: Internal Medicine | Admitting: Radiology

## 2010-09-16 ENCOUNTER — Ambulatory Visit (INDEPENDENT_AMBULATORY_CARE_PROVIDER_SITE_OTHER): Payer: Medicare Other | Admitting: Cardiology

## 2010-09-16 ENCOUNTER — Encounter: Payer: Medicare Other | Admitting: Cardiology

## 2010-09-16 VITALS — BP 134/96 | HR 77 | Ht 63.0 in | Wt 254.0 lb

## 2010-09-16 DIAGNOSIS — I079 Rheumatic tricuspid valve disease, unspecified: Secondary | ICD-10-CM | POA: Insufficient documentation

## 2010-09-16 DIAGNOSIS — I4891 Unspecified atrial fibrillation: Secondary | ICD-10-CM

## 2010-09-16 DIAGNOSIS — R0609 Other forms of dyspnea: Secondary | ICD-10-CM | POA: Insufficient documentation

## 2010-09-16 DIAGNOSIS — G4733 Obstructive sleep apnea (adult) (pediatric): Secondary | ICD-10-CM

## 2010-09-16 DIAGNOSIS — I359 Nonrheumatic aortic valve disorder, unspecified: Secondary | ICD-10-CM | POA: Insufficient documentation

## 2010-09-16 DIAGNOSIS — R0989 Other specified symptoms and signs involving the circulatory and respiratory systems: Secondary | ICD-10-CM | POA: Insufficient documentation

## 2010-09-16 DIAGNOSIS — I10 Essential (primary) hypertension: Secondary | ICD-10-CM

## 2010-09-16 MED ORDER — PRAVASTATIN SODIUM 40 MG PO TABS: 40.0000 mg | ORAL_TABLET | Freq: Every evening | ORAL | Status: DC

## 2010-09-16 NOTE — Assessment & Plan Note (Signed)
New in onset.  Now on higher dose of dilt.  Back in NSR.  Triggers unclear.  Thyroid studies ok, and echo pending.  Favor anticoagulation, but she would like to hold off, and understands the risk of stroke.  She is anticipating near term hip surgery, and would be willing to go on rivaroxaban as part of that.  Will defer and she is scheduled to be seen in the next couple of weeks.

## 2010-09-16 NOTE — Progress Notes (Signed)
HPI:  She is in for a follow up visit.  She was seen in the ER with some chest discomfort in the setting of rapid atrial fibrillation.  Her diltiazem was increased, and she was counseled about antithrombotic therapy, but wanted to defer in anticipation of hip surgery with Dr.Olin.  Her CHADS VAsc2 score is 4 with a 4% risk of stroke.  She is aware of this and we have gone through it twice.  She had an echo done today, and her TSH was normal.  No further chest pain.  I told her I thought that nuclear imaging would likely be appropriate before her hip surgery given both chest pain, and atrial fib.  She has DM and might benefit from the use of Flecainide if she has a recurrence.  Not having any symptoms.  Says she was evaluated for sleep apnea, but did not have enough to treat.  No exertional symptoms of chest pain.    Current Outpatient Prescriptions  Medication Sig Dispense Refill  . albuterol (PROAIR HFA) 108 (90 BASE) MCG/ACT inhaler Inhale 2 puffs into the lungs as directed.        [name] BAYER CONTOUR TEST test strip USE AS DIRECTED (DX. 250.00)  50 strip  2  . benazepril (LOTENSIN) 20 MG tablet TAKE 1 TABLET BY MOUTH ONCE A DAY  30 tablet  4  . colchicine 0.6 MG tablet Take 0.6 mg by mouth daily as needed.        . diltiazem (DILACOR XR) 240 MG 24 hr capsule Take 240 mg by mouth daily.        . furosemide (LASIX) 40 MG tablet TAKE 1 TABLET EVERY DAY  30 tablet  4  . glimepiride (AMARYL) 4 MG tablet TAKE 1 TABLET BY MOUTH ONCE A DAY  30 tablet  4  . methocarbamol (ROBAXIN) 500 MG tablet Take 500 mg by mouth 4 (four) times daily as needed.        [name] MICROLET LANCETS MISC by Does not apply route.        . mometasone (NASONEX) 50 MCG/ACT nasal spray Place 2 sprays into the nose as needed.       . promethazine (PHENERGAN) 25 MG suppository Place 25 mg rectally every 6 (six) hours as needed.        . promethazine-codeine (PHENERGAN WITH CODEINE) 6.25-10 MG/5ML syrup Take 5 mLs by mouth every 6 (six) hours as  needed.        . traMADol (ULTRAM) 50 MG tablet Take 50 mg by mouth every 8 (eight) hours as needed.        . simvastatin (ZOCOR) 20 MG tablet Take 20 mg by mouth at bedtime.          Allergies  Allergen Reactions  . Morphine     REACTION: Reaction not known  . Nifedipine     REACTION: hives and itching  . Piroxicam     REACTION: itching and hives    Past Medical History  Diagnosis Date  . Allergic rhinitis   . Asthma   . Gout   . Hypertension   . Peptic ulcer disease   . History of PSVT (paroxysmal supraventricular tachycardia)   . Diabetes mellitus   . Colon cancer 2007    s/p surgery and chemo    Past Surgical History  Procedure Date  . Caesarean section     x 3  . Knee surgery     bilateral  . Pyelonidal cystectomy   .  Heel spur surgery     x 2  . Colectomy     Family History  Problem Relation Age of Onset  . Hypertension Other   . Coronary artery disease Other   . Heart attack Mother   . Stroke Mother   . Heart disease Father     History   Social History  . Marital Status: Married    Spouse Name: N/A    Number of Children: N/A  . Years of Education: N/A   Occupational History  . retired    Social History Main Topics  . Smoking status: Former Games developer  . Smokeless tobacco: Not on file  . Alcohol Use: No  . Drug Use: No  . Sexually Active: Not on file   Other Topics Concern  . Not on file   Social History Narrative   Regular exercise- no.    ROS: Please see the HPI.  All other systems reviewed and negative.  PHYSICAL EXAM:  BP 134/96  Pulse 77  Ht 5\' 3"  (1.6 m)  Wt 254 lb (115.214 kg)  BMI 44.99 kg/m2  General: Well developed, well nourished, in no acute distress. Head:  Normocephalic and atraumatic. Neck: no JVD Lungs: Clear to auscultation and percussion. Heart: Normal S1 and S2.  No murmur, rubs or gallops.  Pulses: Pulses normal in all 4 extremities. Extremities: No clubbing or cyanosis. No edema. Neurologic: Alert and  oriented x 3.  EKG:  NSR. WNL. ASSESSMENT AND PLAN:

## 2010-09-16 NOTE — Patient Instructions (Signed)
Your physician recommends that you schedule a follow-up appointment in: 1 month with Dr. Riley Kill

## 2010-09-16 NOTE — Assessment & Plan Note (Signed)
Not sure of prior details, but may need to be reassessed.

## 2010-09-16 NOTE — Assessment & Plan Note (Signed)
Currently controlled.

## 2010-09-29 ENCOUNTER — Other Ambulatory Visit: Payer: Self-pay | Admitting: Internal Medicine

## 2010-10-07 ENCOUNTER — Encounter: Payer: Self-pay | Admitting: Cardiology

## 2010-10-08 NOTE — Consult Note (Signed)
[name], [name]                ACCOUNT NO.:  [id number]  MEDICAL RECORD NO.:  [id number]  LOCATION:  [facility]                         FACILITY:  [facility]  PHYSICIAN:  Arturo Morton. Riley Kill, MD, FACCDATE OF BIRTH:  1940-04-24  DATE OF CONSULTATION:  09/09/2010 DATE OF DISCHARGE:  09/09/2010                                CONSULTATION   PRIMARY CARDIOLOGIST:  New patient to [facility] cardiology, currently being evaluated by Dr. Riley Kill.  PRIMARY CARE PROVIDER:  Rosalyn Gess. Norins, MD  REASON FOR CONSULTATION:  Atrial fibrillation with rapid ventricular response and chest discomfort.  PATIENT PROFILE:  This is a 70 year old Caucasian female with normal coronary arteries and history of atrial fibrillation who presents feeling poorly, chest discomfort and shortness of breath was found to be in atrial fibrillation with rapid ventricular response.  The patient has since converted to normal sinus rhythm.  PAST MEDICAL HISTORY: 1. Hypertension. 2. Non-insulin-dependent diabetes mellitus type 2. 3. Hyperlipidemia. 4. Peptic ulcer disease. 5. Fibromyalgia. 6. Personal history of atrial flutter in the past, although no strips     are available. 7. History of pulmonary embolus greater than 20 years ago secondary to     injury to the great toe. 8. Obesity. 9. Normal coronary arteries per cardiac catheterization in 2005. 10.Asthma. 11.Gout. 12.Obstructive sleep apnea, not on CPAP because she felt it was not     needed. 13.Colon cancer status post surgery and chemotherapy in 2007. 14.Status post C-section x3. 15.Status post bilateral knee surgeries. 16.Status post Heel Spur surgery.  HISTORY OF PRESENT ILLNESS:  This is a 70 year old Caucasian female with the above-stated problem list who has recently been treated for bacterial vaginosis with Flagyl for 7 days starting August 27, 2010, her symptoms have improved and states she was in her usual state of health until waking this morning.  She  went to the restroom and stated she felt poorly.  She had some chest discomfort around her neck area with some increased shortness of breath.  She took her vitals which demonstrated a blood pressure of 180/100 with a pulse of 154.  She took aspirin and tried to rest.  When the symptoms continued to last and was unable to treat them with cuff the patient called EMS.  Of note, the patient states that she feels palpitations/flutters several times a week.  These are short-lived and are relieved with a cuff.  The patient was found to be in atrial fibrillation with rapid ventricular response of 158 beats per minute.  Upon arrival to the emergency department, she was given her oral diltiazem and the patient spontaneously converted to normal sinus rhythm.  Her feeling poorly, chest discomfort and shortness of breath resolved with return of normal sinus rhythm.  Cardiac enzymes are negative x1.  Electrolytes are within normal limits.  A TSH level over a year ago was also within normal limits.  The patient states she has been compliant with her medications.  She denies recent fevers or chills.  She states she does have some left ear pain as well as right hip and knee pain.  She says she has been under increased stress secondary to the pain and trying to get  in to see an orthopedic surgeon.  She occasionally has lower extremity edema but denies dyspnea on exertion, orthopnea or PND.  The patient also denies recent nausea, vomiting or diarrhea.  Cardiology is asked to see the patient for further recommendations.  SOCIAL HISTORY:  The patient lives at home with her husband.  She has three daughters.  She has a history of tobacco abuse but quit in the 90s.  She denies alcohol abuse.  FAMILY HISTORY:  Pertinent for premature coronary artery disease in her mother, initially MI in her 43s, as well as a history of stroke.  Her father also had a history of arrhythmias, but no premature coronary artery  disease.  She has siblings with hypertension.  ALLERGIES:  PROCARDIA, FELDENE and MORPHINE.  The patient states she becomes extremely hypotensive with NITROGLYCERIN.  HOME MEDICATIONS: 1. Benazepril 20 mg daily. 2. Diltiazem 240 mg daily. 3. Lasix 40 mg as needed for increased swelling. 4. Amaryl 4 mg daily. 5. Pro-Air HFA as needed for shortness of breath. 6. Tramadol 50 mg q. 8 hours as needed for pain. 7. Promethazine 25 mg q. 6 hours as needed. 8. Colchicine 0.6 mg as needed. 9. Aspirin 325 mg as needed.  REVIEW OF SYSTEMS:  All pertinent positives and negatives as stated in HPI.  Other systems have been reviewed are negative.  PHYSICAL EXAMINATION:  VITAL SIGNS:  Pulse 82-146, respirations 18, blood pressure 137/70, O2 saturation 90% on room air. GENERAL:  This is an obese middle-aged female.  She is in no acute distress. HEENT:  Normal. NECK:  Supple without bruit. HEART:  Regular rate and rhythm with S1-S2.  No murmur, rub or gallop noted.  PMI is normal.  Pulses 2+ and equal bilaterally. LUNGS:  Clear to auscultation bilaterally without wheezes, rales or rhonchi. ABDOMEN:  Soft, nontender, positive bowel sounds x4, obese. EXTREMITIES:  No clubbing, cyanosis or edema. MUSCULOSKELETAL:  No joint deformities or effusions. NEURO:  Alert and oriented x3, cranial nerves II through XII grossly intact.  IMAGING:  Chest x-ray showing no acute findings.  EKG showing atrial fibrillation with rapid ventricular response at a rate 158 beats per minute.  Repeat EKG showing sinus rhythm without acute ST - T-wave changes.  LABORATORY DATA:  WBC is 11.3, hemoglobin 16.3, hematocrit 46.1, platelet 236.  Sodium 141, potassium 3.9, BUN 8, creatinine 0.6. Cardiac enzymes negative x1.  ASSESSMENT/PLAN:  This is a 70 year old Caucasian female with history of normal coronary arteries per cardiac catheterization in 2005, as well as atrial arrhythmias who presents with atrial fibrillation  with rapid ventricular response.  The patient has converted to normal sinus rhythm with her diltiazem.  The patient does have a CHADVAS score of 4 secondary to her hypertension, diabetes mellitus, female gender, and age.  She is currently not on anticoagulation and states that she does not wants to take anticoagulation.  Risk of stroke were discussed at length with the patient, she states she voices understanding but refuses anticoagulation.  It has been our recommendation she continue on full dose aspirin daily.  The patient's diltiazem will be increased to 360 mg daily with the patient being in normal sinus rhythm.  Lab  work reviewed and felt the patient is stable for home with outpatient followup.  The patient is agreeable to this and does not wants to go home.  Therefore, a TSH will be ordered prior to discharge.  She will have a followup echo with Dr. Riley Kill on September 16, 2010, at 10:30  a.m. and then she will have an appointment at 11:40 a.m. on the same date to discuss results.  The patient is to call our office in the interim for any further problems or concerns.     Leonette Monarch, PA-C   ______________________________ Arturo Morton Riley Kill, MD, [facility]    NB/MEDQ  D:  09/09/2010  T:  09/09/2010  Job:  [id number]  cc:   Rosalyn Gess. Norins, MD  Electronically Signed by Alen Blew P.A. on 09/10/2010 03:35:13 PM Electronically Signed by Shawnie Pons MD [facility] on 10/08/2010 09:48:59 AM

## 2010-10-30 ENCOUNTER — Ambulatory Visit (INDEPENDENT_AMBULATORY_CARE_PROVIDER_SITE_OTHER): Payer: Medicare Other | Admitting: Cardiology

## 2010-10-30 ENCOUNTER — Encounter: Payer: Self-pay | Admitting: Cardiology

## 2010-10-30 VITALS — BP 171/100 | HR 68 | Ht 64.0 in | Wt 255.8 lb

## 2010-10-30 DIAGNOSIS — E785 Hyperlipidemia, unspecified: Secondary | ICD-10-CM

## 2010-10-30 DIAGNOSIS — I4891 Unspecified atrial fibrillation: Secondary | ICD-10-CM

## 2010-10-30 DIAGNOSIS — I10 Essential (primary) hypertension: Secondary | ICD-10-CM

## 2010-10-30 NOTE — Patient Instructions (Signed)
Your physician recommends that you schedule a follow-up appointment in: 2 months with Dr. Stuckey   

## 2010-11-01 NOTE — Progress Notes (Signed)
HPI:  The patient stopped metformin and ASA because she was convinced both were burning her stomach.  Fortunately, she rarely uses Indocin.  She also stopped pravastatin as it made her nauseated as well.  She does not sleep well, really less than four hours per night.  She says she has had a sleep study, and does not need CPAP, but we do not have any of that data.  Her diltiazem was increased recently in order to keep her rate controlled.  She also is not ready for her second surgery, and she got a cortisone shot apparently.    Current Outpatient Prescriptions  Medication Sig Dispense Refill  . albuterol (PROVENTIL HFA;VENTOLIN HFA) 108 (90 BASE) MCG/ACT inhaler Inhale 2 puffs into the lungs every 6 (six) hours as needed.        [name] BAYER CONTOUR TEST test strip USE AS DIRECTED (DX. 250.00)  50 strip  2  . benazepril (LOTENSIN) 20 MG tablet TAKE 1 TABLET BY MOUTH ONCE A DAY  30 tablet  4  . colchicine 0.6 MG tablet Take 0.6 mg by mouth daily as needed.        . diazepam (VALIUM) 5 MG tablet Take 5 mg by mouth every 6 (six) hours as needed.        . diltiazem (CARDIZEM CD) 360 MG 24 hr capsule Take 360 mg by mouth daily.        . furosemide (LASIX) 40 MG tablet        . glimepiride (AMARYL) 4 MG tablet TAKE 1 TABLET BY MOUTH ONCE A DAY  30 tablet  4  . indomethacin (INDOCIN) 25 MG capsule Take 25 mg by mouth 3 (three) times daily as needed.        . loperamide (IMODIUM) 2 MG capsule Take 2 mg by mouth as needed.        [name] LORazepam (ATIVAN) 1 MG tablet Take 1 mg by mouth as needed.        . methocarbamol (ROBAXIN) 500 MG tablet Take 500 mg by mouth 4 (four) times daily as needed.        [name] MICROLET LANCETS MISC by Does not apply route.        [name] oxyCODONE-acetaminophen (PERCOCET) 5-325 MG per tablet Take 1 tablet by mouth every 4 (four) hours as needed.        . promethazine-codeine (PHENERGAN WITH CODEINE) 6.25-10 MG/5ML syrup Take 5 mLs by mouth every 6 (six) hours as needed.        . traMADol  (ULTRAM) 50 MG tablet Take 50 mg by mouth every 8 (eight) hours as needed.        . promethazine (PHENERGAN) 25 MG suppository UNWRAP AND INSERT 1 SUPPSOITORY INTO RECTUM EVERY 6 HOURS  12 suppository  0    Allergies  Allergen Reactions  . Morphine     REACTION: Reaction not known  . Nifedipine     REACTION: hives and itching  . Piroxicam     REACTION: itching and hives    Past Medical History  Diagnosis Date  . Allergic rhinitis   . Asthma   . Gout   . Hypertension   . Peptic ulcer disease   . History of PSVT (paroxysmal supraventricular tachycardia)   . Diabetes mellitus   . Colon cancer 2007    s/p surgery and chemo  . Hx of colonoscopy     Past Surgical History  Procedure Date  . Caesarean section  x 3  . Knee surgery     bilateral  . Pyelonidal cystectomy   . Heel spur surgery     x 2  . Colectomy     Family History  Problem Relation Age of Onset  . Hypertension Other   . Coronary artery disease Other   . Heart attack Mother   . Stroke Mother   . Heart disease Father     History   Social History  . Marital Status: Married    Spouse Name: N/A    Number of Children: N/A  . Years of Education: N/A   Occupational History  . retired    Social History Main Topics  . Smoking status: Former Games developer  . Smokeless tobacco: Not on file  . Alcohol Use: No  . Drug Use: No  . Sexually Active: Not on file   Other Topics Concern  . Not on file   Social History Narrative   Regular exercise- no.    ROS: Please see the HPI.  All other systems reviewed and negative.  PHYSICAL EXAM:  BP 171/100  Pulse 68  Ht 5\' 4"  (1.626 m)  Wt 255 lb 12.8 oz (116.03 kg)  BMI 43.91 kg/m2  SpO2 96%  General: Well developed, well nourished, in no acute distress. Head:  Normocephalic and atraumatic. Neck: no JVD Lungs: Clear to auscultation and percussion. Heart: Normal S1 and S2.  No murmur, rubs or gallops.  Abdomen:  Normal bowel sounds; soft; non tender; no  organomegaly Pulses: Pulses normal in all 4 extremities. Extremities: No clubbing or cyanosis. No edema. Neurologic: Alert and oriented x 3.  EKG:  Delay in R wave progression.  Otherwise, unremarkable.   ASSESSMENT AND PLAN:

## 2010-11-05 ENCOUNTER — Other Ambulatory Visit: Payer: Self-pay | Admitting: Internal Medicine

## 2010-11-08 NOTE — Assessment & Plan Note (Signed)
Currently on diltiazem.  I would lean toward anticoagulation, but she is waiting to see what happens with her surgery first.  Once she has this she can start and not come off afterward unless there is a problem.  She should get some idea about her surgery soon.

## 2010-11-08 NOTE — Assessment & Plan Note (Signed)
Had problem with pravachol so will defer this to Dr. Debby Bud.

## 2010-11-08 NOTE — Assessment & Plan Note (Signed)
She is on a higher dose of diltiazem, and her BP remains elevated.  She again would potentially benefit from another sleep study, if she is not  Sleeping, and has poor BP control.  I think she feels she does not need this.  Her BP is up, but she also apparently got a cortisone shot so this may be problematic.  She needs to see Dr. Debby Bud in follow up.  She should remain on the diltiazem for now.

## 2010-11-11 ENCOUNTER — Telehealth: Payer: Self-pay | Admitting: Cardiology

## 2010-11-11 NOTE — Telephone Encounter (Signed)
Called patient back. She is complaining of pain in her shoulder on the left side with radiation to her hand times 3 weeks. She states that the pain is worse the last few days and more intense with position changes. History of rotator cuff tear many years ago per the patient. Advised her to call her orthopod for an appointment ASAP.  She states that she will call tomorrow for an appointment.

## 2010-11-11 NOTE — Telephone Encounter (Signed)
Pain in shoulder radiating to neck and down arm, tingling in hand, does have torn rotater cuff

## 2010-11-18 ENCOUNTER — Ambulatory Visit (INDEPENDENT_AMBULATORY_CARE_PROVIDER_SITE_OTHER): Payer: Medicare Other | Admitting: Endocrinology

## 2010-11-18 ENCOUNTER — Encounter: Payer: Self-pay | Admitting: Endocrinology

## 2010-11-18 ENCOUNTER — Other Ambulatory Visit (INDEPENDENT_AMBULATORY_CARE_PROVIDER_SITE_OTHER): Payer: Medicare Other

## 2010-11-18 VITALS — BP 148/80 | HR 81 | Temp 98.2°F | Ht 64.0 in | Wt 259.4 lb

## 2010-11-18 DIAGNOSIS — M5412 Radiculopathy, cervical region: Secondary | ICD-10-CM

## 2010-11-18 DIAGNOSIS — E119 Type 2 diabetes mellitus without complications: Secondary | ICD-10-CM

## 2010-11-18 LAB — BASIC METABOLIC PANEL
Calcium: 8.7 mg/dL (ref 8.4–10.5)
Creatinine, Ser: 0.8 mg/dL (ref 0.4–1.2)
GFR: 72.53 mL/min (ref >60.00)
Glucose, Bld: 330 mg/dL — ABNORMAL HIGH (ref 70–99)
Sodium: 139 mEq/L (ref 135–145)

## 2010-11-18 MED ORDER — OXYCODONE-ACETAMINOPHEN 5-325 MG PO TABS: 1.0000 | ORAL_TABLET | ORAL | Status: DC | PRN

## 2010-11-18 NOTE — Patient Instructions (Addendum)
Here is a refill of your pain medication. Let's check an mri of your neck.  you will receive a phone call, about a day and time for an appointment. blood tests are being requested for you today.  please call [phone] to hear your test results.  You will be prompted to enter the 9-digit "MRN" number that appears at the top left of this page, followed by #.  Then you will hear the message. please see dr Debby Bud a few days after the mri. (update: i left message on phone-tree:  rx as we discussed)

## 2010-11-18 NOTE — Progress Notes (Signed)
Subjective:    Patient ID: [name], female    DOB: 10/19/1940, 70 y.o.   MRN: [id number]  HPI Pt states 2 weeks of moderate pain rad from the back of the neck, down to the left hand and thumb.  She has assoc numbness.   no cbg record, but states cbg's are in the mid-100's.   Past Medical History  Diagnosis Date  . Allergic rhinitis   . Asthma   . Gout   . Hypertension   . Peptic ulcer disease   . History of PSVT (paroxysmal supraventricular tachycardia)   . Diabetes mellitus   . Colon cancer 2007    s/p surgery and chemo  . Hx of colonoscopy     Past Surgical History  Procedure Date  . Caesarean section     x 3  . Knee surgery     bilateral  . Pyelonidal cystectomy   . Heel spur surgery     x 2  . Colectomy     History   Social History  . Marital Status: Married    Spouse Name: N/A    Number of Children: N/A  . Years of Education: N/A   Occupational History  . retired    Social History Main Topics  . Smoking status: Former Smoker -- 1.5 packs/day for 30 years    Types: Cigarettes  . Smokeless tobacco: Not on file   Comment: quit 19 years ago  . Alcohol Use: No  . Drug Use: No  . Sexually Active: Not on file   Other Topics Concern  . Not on file   Social History Narrative   Regular exercise- no.    Current Outpatient Prescriptions on File Prior to Visit  Medication Sig Dispense Refill  . albuterol (PROVENTIL HFA;VENTOLIN HFA) 108 (90 BASE) MCG/ACT inhaler Inhale 2 puffs into the lungs every 6 (six) hours as needed.        [name] BAYER CONTOUR TEST test strip USE AS DIRECTED (DX. 250.00)  50 strip  2  . benazepril (LOTENSIN) 20 MG tablet TAKE 1 TABLET BY MOUTH ONCE A DAY  30 tablet  4  . colchicine 0.6 MG tablet Take 0.6 mg by mouth daily as needed.        . diazepam (VALIUM) 5 MG tablet Take 5 mg by mouth every 6 (six) hours as needed.        . furosemide (LASIX) 40 MG tablet        . glimepiride (AMARYL) 4 MG tablet TAKE 1 TABLET BY MOUTH ONCE A  DAY  30 tablet  4  . indomethacin (INDOCIN) 25 MG capsule TAKE ONE CAPSULE BY MOUTH 3 TIMES A DAY AS NEEDED FOR GOUT PAIN  30 capsule  1  . loperamide (IMODIUM) 2 MG capsule Take 2 mg by mouth as needed.        [name] LORazepam (ATIVAN) 1 MG tablet Take 1 mg by mouth as needed.        . methocarbamol (ROBAXIN) 500 MG tablet Take 500 mg by mouth 4 (four) times daily as needed.        [name] MICROLET LANCETS MISC by Does not apply route.        . promethazine-codeine (PHENERGAN WITH CODEINE) 6.25-10 MG/5ML syrup Take 5 mLs by mouth every 6 (six) hours as needed.        . traMADol (ULTRAM) 50 MG tablet Take 50 mg by mouth every 8 (eight) hours as needed.        [name]  promethazine (PHENERGAN) 25 MG suppository UNWRAP AND INSERT 1 SUPPSOITORY INTO RECTUM EVERY 6 HOURS  12 suppository  0    Allergies  Allergen Reactions  . Morphine     REACTION: Reaction not known  . Nifedipine     REACTION: hives and itching  . Piroxicam     REACTION: itching and hives    Family History  Problem Relation Age of Onset  . Hypertension Other   . Coronary artery disease Other   . Heart attack Mother   . Stroke Mother   . Heart disease Father     BP 148/80  Pulse 81  Temp(Src) 98.2 F (36.8 C) (Oral)  Ht 5\' 4"  (1.626 m)  Wt 259 lb 6.4 oz (117.663 kg)  BMI 44.53 kg/m2  SpO2 96%  Review of Systems Denies rash and headache    Objective:   Physical Exam VITAL SIGNS:  See vs page GENERAL: no distress Neck: full rom, but this produces pain at the back of the neck Neuro: sensation is intact to touch on the left hand, but decreased from normal (especially at the radial aspect of the hand). Motor function is normal throughout the lue.    Lab Results  Component Value Date   HGBA1C 7.8* 11/18/2010    bmet is noted    Assessment & Plan:  Left cervical radiculopathy, prob C-6.  neew Dm.  This is a relative contraindication to steroids, especially since glucose is elev today Htn, ? exac by pain

## 2010-11-23 ENCOUNTER — Inpatient Hospital Stay (HOSPITAL_COMMUNITY): Admission: RE | Admit: 2010-11-23 | Payer: Medicare Other | Source: Ambulatory Visit

## 2010-11-26 ENCOUNTER — Telehealth: Payer: Self-pay | Admitting: *Deleted

## 2010-11-26 ENCOUNTER — Ambulatory Visit (HOSPITAL_COMMUNITY)
Admission: RE | Admit: 2010-11-26 | Discharge: 2010-11-26 | Disposition: A | Discharge status: Home / Self Care | Payer: Medicare Other | Source: Ambulatory Visit | Attending: Endocrinology | Admitting: Endocrinology

## 2010-11-26 DIAGNOSIS — M5412 Radiculopathy, cervical region: Secondary | ICD-10-CM

## 2010-11-26 LAB — POCT I-STAT, CHEM 8
Calcium, Ion: 1.18 mmol/L (ref 1.12–1.32)
Creatinine, Ser: 0.7 mg/dL (ref 0.4–1.2)
Glucose, Bld: 203 mg/dL — ABNORMAL HIGH (ref 70–99)
HCT: 49 % — ABNORMAL HIGH (ref 36.0–46.0)
Hemoglobin: 16.7 g/dL — ABNORMAL HIGH (ref 12.0–15.0)
Potassium: 3.8 mEq/L (ref 3.5–5.1)
TCO2: 26 mmol/L (ref 0–100)

## 2010-11-26 LAB — POCT CARDIAC MARKERS
CKMB, poc: 1 ng/mL (ref 1.0–8.0)
CKMB, poc: 1.4 ng/mL (ref 1.0–8.0)
Myoglobin, poc: 59.6 ng/mL (ref 12–200)
Troponin i, poc: <0.05 ng/mL (ref 0.00–0.09)

## 2010-11-26 NOTE — Telephone Encounter (Signed)
Left message for pt to callback office.  

## 2010-11-26 NOTE — Telephone Encounter (Signed)
Pt would like order placed for Open MRI for shoulder and spine. She is claustrophobic.

## 2010-11-26 NOTE — Telephone Encounter (Signed)
i ordered mri of the c-spine.  Please see dr Debby Bud a few days later

## 2010-11-27 NOTE — Telephone Encounter (Signed)
Pt informed

## 2010-11-30 ENCOUNTER — Other Ambulatory Visit: Payer: Self-pay | Admitting: Internal Medicine

## 2010-11-30 LAB — I-STAT 8, (EC8 V) (CONVERTED LAB)
BUN: 11
Bicarbonate: 27.5 — ABNORMAL HIGH
Glucose, Bld: 132 — ABNORMAL HIGH
Operator id: 282201
pCO2, Ven: 43.1 — ABNORMAL LOW
pH, Ven: 7.414 — ABNORMAL HIGH

## 2010-11-30 LAB — CBC
Platelets: 320
RBC: 4.99
WBC: 10.4

## 2010-11-30 LAB — POCT CARDIAC MARKERS
CKMB, poc: 1.3
Myoglobin, poc: 71.3
Operator id: 282201
Troponin i, poc: <0.05

## 2010-11-30 LAB — DIFFERENTIAL
Basophils Relative: 0
Lymphs Abs: 3.8
Monocytes Relative: 6
Neutro Abs: 5.6
Neutrophils Relative %: 54

## 2010-11-30 LAB — D-DIMER, QUANTITATIVE: D-Dimer, Quant: 0.39

## 2010-12-03 ENCOUNTER — Ambulatory Visit
Admission: RE | Admit: 2010-12-03 | Discharge: 2010-12-03 | Disposition: A | Discharge status: Home / Self Care | Payer: Medicare Other | Source: Ambulatory Visit | Attending: Endocrinology | Admitting: Endocrinology

## 2010-12-03 ENCOUNTER — Other Ambulatory Visit: Payer: Self-pay | Admitting: Endocrinology

## 2010-12-03 DIAGNOSIS — M5412 Radiculopathy, cervical region: Secondary | ICD-10-CM

## 2010-12-04 ENCOUNTER — Other Ambulatory Visit (HOSPITAL_COMMUNITY): Payer: Medicare Other

## 2010-12-07 ENCOUNTER — Telehealth: Payer: Self-pay | Admitting: Cardiology

## 2010-12-07 ENCOUNTER — Other Ambulatory Visit: Payer: Self-pay | Admitting: Internal Medicine

## 2010-12-07 LAB — URINALYSIS, ROUTINE W REFLEX MICROSCOPIC
Glucose, UA: >1000 — AB
Ketones, ur: NEGATIVE
Nitrite: NEGATIVE
Protein, ur: NEGATIVE
pH: 5.5

## 2010-12-07 LAB — HEMOGLOBIN AND HEMATOCRIT, BLOOD
HCT: 29.2 — ABNORMAL LOW
HCT: 30.3 — ABNORMAL LOW
HCT: 39.8
Hemoglobin: 10.2 — ABNORMAL LOW
Hemoglobin: 10.3 — ABNORMAL LOW

## 2010-12-07 LAB — TYPE AND SCREEN
ABO/RH(D): O NEG
Antibody Screen: NEGATIVE

## 2010-12-07 LAB — URINE MICROSCOPIC-ADD ON

## 2010-12-07 LAB — BASIC METABOLIC PANEL
BUN: 10
CO2: 25
Calcium: 8.9
Chloride: 99
Creatinine, Ser: 0.83
GFR calc Af Amer: >60

## 2010-12-07 LAB — ABO/RH: ABO/RH(D): O NEG

## 2010-12-07 LAB — POCT I-STAT 4, (NA,K, GLUC, HGB,HCT)
Glucose, Bld: 95
Operator id: 100511

## 2010-12-07 LAB — APTT: aPTT: 32

## 2010-12-07 NOTE — Telephone Encounter (Signed)
I spoke with the pt and she cannot take the 360mg  Diltiazem.  This dosage of medication causes stomach pain, nausea and vomiting and increased palpitations.  The pt would like to go back to 240mg  daily.  I instructed that she must monitor her pulse when decreasing the Diltiazem. The pt also said she has not had surgery at this time.  The pt is now having problems with her neck and is awaiting the results of her MRI.  The pt has an upcoming appointment with Dr Riley Kill in November.   I reviewed the pt's medication list and she has two Diltiazem listed on med list.  I will clarify the correct medication with Dr Riley Kill.

## 2010-12-07 NOTE — Telephone Encounter (Signed)
Pt calling to change diltiazem, was on 240mg  was increased to 360 mg and she just cannot take it wants to go back to 240mg  and needs refill, uses [facility] [address]

## 2010-12-07 NOTE — Telephone Encounter (Signed)
I had Dr Riley Kill review the pt's medication list and he said the pt should have Cardizem CD 240mg  daily.  Because the pt is going to decrease her diltiazem Dr Riley Kill would like the pt seen this week for an OV with Scott PA-C. I spoke with Rosanne Ashing at the pharmacy and cancelled the order for Dilacor XR that was sent in by Dr Norin's office. I called in a Rx for Diltiazem CD 240mg  daily #30 RFx6. I spoke with the pt and made an appt for her to be seen on 12/11/10.

## 2010-12-11 ENCOUNTER — Encounter: Payer: Self-pay | Admitting: Physician Assistant

## 2010-12-11 ENCOUNTER — Ambulatory Visit (INDEPENDENT_AMBULATORY_CARE_PROVIDER_SITE_OTHER): Payer: Medicare Other | Admitting: Physician Assistant

## 2010-12-11 ENCOUNTER — Telehealth: Payer: Self-pay | Admitting: Physician Assistant

## 2010-12-11 DIAGNOSIS — I4891 Unspecified atrial fibrillation: Secondary | ICD-10-CM

## 2010-12-11 DIAGNOSIS — I10 Essential (primary) hypertension: Secondary | ICD-10-CM

## 2010-12-11 MED ORDER — BENAZEPRIL HCL 20 MG PO TABS: 20.0000 mg | ORAL_TABLET | Freq: Two times a day (BID) | ORAL | Status: DC

## 2010-12-11 NOTE — Assessment & Plan Note (Signed)
Borderline control.  Given her readings at home, I believe that she needs further adjustments made in her blood pressure regimen.  I will increase her enalapril to 20 mg twice daily.  She will have a basic metabolic panel checked in one week along with a blood pressure check with the nurse.  She can followup with Dr. Riley Kill next month as scheduled.

## 2010-12-11 NOTE — Telephone Encounter (Signed)
Pt was  In today and benezepril 20 mg was increased to two a day, will need a refill since she will run out sooner, needs auth with insurance because they wont pay due to early refill,  [facility] [address]

## 2010-12-11 NOTE — Progress Notes (Signed)
History of Present Illness: Primary Cardiologist:  Dr.  Shawnie Pons   [name] is a 70 y.o. female presents for follow up on BP and heart rate.  She has a history of paroxysmal atrial fibrillation.  She has refused Coumadin in the past.  Cardiac catheterization 6/05 demonstrated normal coronary arteries.  Last Myoview 4/11: E. EF 66%, no ischemia.  Last echocardiogram 7/12: Mild LVH, EF 55-60%, mild AI, diastolic dysfunction.  Other history includes hypertension, diabetes, hyperlipidemia, peptic ulcer disease, fibromyalgia, asthma, gout, sleep apnea, colon cancer status post resection 2007 and remote pulmonary emboli in the 1990s.  She last saw Dr. Riley Kill 9/16.  Her blood pressure was elevated and he adjusted her diltiazem.  He did recommend starting anticoagulation given her significant thromboembolic risk factors.  However, she deferred this to 2 possible upcoming surgery.  She called in on 10/15-2 possible side effects and the increased dose of diltiazem.  This was decreased from 360 mg back to 240 mg a day.  She was brought in today for followup.  She has a lot of nausea and vomiting and increased palpitations with the higher dose of diltiazem.  She feels better after going back to 240 mg QD.  She still has occasional fluttering.  This lasts seconds.  No symptoms like what she had with rapid Afib.  She has chronic dyspnea.  No changes.  No chest pain.  No orthopnea, PND or significant edema.  No syncope.  She checks her BP at home.  She notes BPs of 130-160s/70-90s.    Past Medical History  Diagnosis Date  . Allergic rhinitis   . Asthma   . Gout   . Hypertension   . Peptic ulcer disease   . History of PSVT (paroxysmal supraventricular tachycardia)   . Diabetes mellitus   . Colon cancer 2007    s/p surgery and chemo  . Hx of colonoscopy   . Atrial fibrillation     patient refuses coumadin; Cardiac catheterization 6/05 demonstrated normal coronary arteries.  Last Myoview 4/11: E. EF  66%, no ischemia.  Last echocardiogram 7/12: Mild LVH, EF 55-60%, mild AI, diastolic dysfunction  . HLD (hyperlipidemia)     Current Outpatient Prescriptions  Medication Sig Dispense Refill  . albuterol (PROVENTIL HFA;VENTOLIN HFA) 108 (90 BASE) MCG/ACT inhaler Inhale 2 puffs into the lungs every 6 (six) hours as needed.        [name] BAYER CONTOUR TEST test strip USE AS DIRECTED (DX. 250.00)  50 strip  2  . colchicine 0.6 MG tablet Take 0.6 mg by mouth daily as needed.        . diazepam (VALIUM) 5 MG tablet Take 5 mg by mouth every 6 (six) hours as needed.        . diltiazem (CARDIZEM CD) 240 MG 24 hr capsule Take 240 mg by mouth daily.        . furosemide (LASIX) 40 MG tablet Every other day      . glimepiride (AMARYL) 4 MG tablet TAKE 1 TABLET BY MOUTH ONCE A DAY  30 tablet  4  . indomethacin (INDOCIN) 25 MG capsule TAKE ONE CAPSULE BY MOUTH 3 TIMES A DAY AS NEEDED FOR GOUT PAIN  30 capsule  1  . loperamide (IMODIUM) 2 MG capsule Take 2 mg by mouth as needed.        [name] LORazepam (ATIVAN) 1 MG tablet Take 1 mg by mouth as needed.        . methocarbamol (ROBAXIN)  500 MG tablet Take 500 mg by mouth 4 (four) times daily as needed.        [name] MICROLET LANCETS MISC by Does not apply route.        [name] oxyCODONE-acetaminophen (PERCOCET) 5-325 MG per tablet Take 1 tablet by mouth every 4 (four) hours as needed.  30 tablet  0  . promethazine (PHENERGAN) 25 MG suppository UNWRAP AND INSERT 1 SUPPSOITORY INTO RECTUM EVERY 6 HOURS  12 suppository  0  . promethazine-codeine (PHENERGAN WITH CODEINE) 6.25-10 MG/5ML syrup Take 5 mLs by mouth every 6 (six) hours as needed.        . traMADol (ULTRAM) 50 MG tablet Take 50 mg by mouth every 8 (eight) hours as needed.        [name] DISCONTD: benazepril (LOTENSIN) 20 MG tablet TAKE 1 TABLET BY MOUTH ONCE A DAY  30 tablet  4  . DISCONTD: benazepril (LOTENSIN) 20 MG tablet Take 1 tablet (20 mg total) by mouth 2 (two) times daily.  60 tablet  11  . aspirin EC 81 MG tablet Take 1  tablet (81 mg total) by mouth daily.      . benazepril (LOTENSIN) 20 MG tablet Take 1 tablet (20 mg total) by mouth 2 (two) times daily.  60 tablet  11    Allergies: Allergies  Allergen Reactions  . Morphine     REACTION: Reaction not known  . Nifedipine     REACTION: hives and itching  . Piroxicam     REACTION: itching and hives    Social history:  Nonsmoker  ROS:  Please see the history of present illness.  All other systems reviewed and negative.   Vital Signs: BP 138/88  Pulse 70  Ht 5\' 4"  (1.626 m)  Wt 257 lb (116.574 kg)  BMI 44.11 kg/m2  PHYSICAL EXAM: Well nourished, well developed, in no acute distress HEENT: normal Neck: no JVD Cardiac:  normal S1, S2; RRR; no murmur Lungs:  clear to auscultation bilaterally, no wheezing, rhonchi or rales Abd: soft, nontender, no hepatomegaly Ext: no edema Skin: warm and dry Neuro:  CNs 2-12 intact, no focal abnormalities noted  EKG:  Sinus rhythm, rate 71, normal axis, no ischemic changes  ASSESSMENT AND PLAN:

## 2010-12-11 NOTE — Assessment & Plan Note (Signed)
Maintaining sinus rhythm.  She continues to refuse Coumadin due to the possibility of upcoming surgeries.  She will try to see if she can tolerate ASA 81 mg QD.

## 2010-12-11 NOTE — Patient Instructions (Signed)
Your physician recommends that you schedule a follow-up appointment in: 01/18/11 @ 1:45 TO SEE DR. Riley Kill   YOU NEED A NURSE VISIT 12/17/10 FOR A BLOOD PRESSURE CHECK AND TO ALSO HAVE LAB WORK FOR A BMET 427.31 AFIB, 401.1 HTN  Your physician has recommended you make the following change in your medication: INCREASE BENAZEPRIL 20 MG 1 TABLET TWICE DAILY

## 2010-12-17 ENCOUNTER — Ambulatory Visit: Payer: Medicare Other

## 2010-12-17 ENCOUNTER — Other Ambulatory Visit: Payer: Medicare Other | Admitting: *Deleted

## 2010-12-25 ENCOUNTER — Ambulatory Visit (INDEPENDENT_AMBULATORY_CARE_PROVIDER_SITE_OTHER): Payer: Medicare Other | Admitting: Internal Medicine

## 2010-12-25 ENCOUNTER — Encounter: Payer: Self-pay | Admitting: Internal Medicine

## 2010-12-25 DIAGNOSIS — I10 Essential (primary) hypertension: Secondary | ICD-10-CM

## 2010-12-25 DIAGNOSIS — M5412 Radiculopathy, cervical region: Secondary | ICD-10-CM

## 2010-12-25 DIAGNOSIS — E119 Type 2 diabetes mellitus without complications: Secondary | ICD-10-CM

## 2010-12-25 MED ORDER — ACARBOSE 25 MG PO TABS: 25.0000 mg | ORAL_TABLET | Freq: Three times a day (TID) | ORAL | Status: DC

## 2010-12-25 MED ORDER — OXYCODONE-ACETAMINOPHEN 5-325 MG PO TABS: 1.0000 | ORAL_TABLET | ORAL | Status: DC | PRN

## 2010-12-25 NOTE — Assessment & Plan Note (Signed)
For surgery in late November

## 2010-12-25 NOTE — Progress Notes (Signed)
  Subjective:    Patient ID: [name], female    DOB: 1940-05-02, 70 y.o.   MRN: [id number]  HPI [name] presents to discuss her diabetes. She has been unable to tolerate metformin due to GI distress. She continues to take glimeperide. Her blood sugars are running in the 150's fasting. She is very much wanting to go with a generic product - acarbose is a reasonable medication to try. She has tried to follow a good diet but still has sugars and carbs in her diet.  She is for C-spine surgery Nov 20th for DDD - Dr. Jordan Likes. She is hoping to have TKR right early next year - Dr. Charlann Boxer wants here diabetes to be under tight control.   Past Medical History  Diagnosis Date  . Allergic rhinitis   . Asthma   . Gout   . Hypertension   . Peptic ulcer disease   . History of PSVT (paroxysmal supraventricular tachycardia)   . Diabetes mellitus   . Colon cancer 2007    s/p surgery and chemo  . Hx of colonoscopy   . Atrial fibrillation     patient refuses coumadin; Cardiac catheterization 6/05 demonstrated normal coronary arteries.  Last Myoview 4/11: E. EF 66%, no ischemia.  Last echocardiogram 7/12: Mild LVH, EF 55-60%, mild AI, diastolic dysfunction  . HLD (hyperlipidemia)    Past Surgical History  Procedure Date  . Caesarean section     x 3  . Knee surgery     bilateral  . Pyelonidal cystectomy   . Heel spur surgery     x 2  . Colectomy    Family History  Problem Relation Age of Onset  . Hypertension Other   . Coronary artery disease Other   . Heart attack Mother   . Stroke Mother   . Heart disease Father    History   Social History  . Marital Status: Married    Spouse Name: N/A    Number of Children: N/A  . Years of Education: N/A   Occupational History  . retired    Social History Main Topics  . Smoking status: Former Smoker -- 1.5 packs/day for 30 years    Types: Cigarettes  . Smokeless tobacco: Not on file   Comment: quit 19 years ago  . Alcohol Use: No  . Drug  Use: No  . Sexually Active: Not on file   Other Topics Concern  . Not on file   Social History Narrative   Regular exercise- no.      Review of Systems System review is negative for any constitutional, cardiac, pulmonary, GI or neuro symptoms or complaints other than as described in the HPI.     Objective:   Physical Exam Vitals noted gen'l - obese white woman in no acute distress Cor - 2+ regular pulse Resp - normal respirations.      Assessment & Plan:  1. Operative clearance - she is medically stable for surgery with the increased risks associated with her obesity and her diabetes which will require perioperative management.

## 2010-12-25 NOTE — Assessment & Plan Note (Signed)
Last A1C 7.8 %. She is intolerant of metformin. On glimeperide she has occasional low readings if she misses a meal. Reviewed drug options- she want sto have a generic product.  Plan - trial of acarbose 25 mg tid.  (greater than 50% of 20 min visit spent on education and counseling)

## 2010-12-25 NOTE — Patient Instructions (Signed)
Diabetes - the most important treatment is a no sugar, low carb diet. Exercise, as possible, is also very important. Plan - continue glimeperide 4 mg - I do not want to increase the dose for fear of low blood sugar. Add Acarbose 25 mg three times a day as second agent. Report back to me if you have any trouble.

## 2010-12-26 NOTE — Assessment & Plan Note (Signed)
BP Readings from Last 3 Encounters:  12/25/10 130/80  12/11/10 138/88  11/18/10 148/80   Adequate control on her present regimen.

## 2010-12-29 ENCOUNTER — Encounter: Payer: Self-pay | Admitting: *Deleted

## 2010-12-29 ENCOUNTER — Telehealth: Payer: Self-pay | Admitting: Cardiology

## 2010-12-29 DIAGNOSIS — I10 Essential (primary) hypertension: Secondary | ICD-10-CM

## 2010-12-29 DIAGNOSIS — I4891 Unspecified atrial fibrillation: Secondary | ICD-10-CM

## 2010-12-29 MED ORDER — BENAZEPRIL HCL 20 MG PO TABS: 20.0000 mg | ORAL_TABLET | Freq: Two times a day (BID) | ORAL | Status: DC

## 2010-12-29 NOTE — Telephone Encounter (Signed)
Pt's benazepril 20 mg was increased from one time a day to two times a day, needs refill with dosage change called to [facility] [address]

## 2010-12-29 NOTE — Telephone Encounter (Signed)
Prescription sent electronically through the system. I left a message on the pt's home number to let her know that the Rx was sent to the pharmacy.

## 2010-12-29 NOTE — Telephone Encounter (Signed)
A user error has taken place: encounter opened in error, closed for administrative reasons.

## 2010-12-31 ENCOUNTER — Ambulatory Visit (INDEPENDENT_AMBULATORY_CARE_PROVIDER_SITE_OTHER): Payer: Medicare Other | Admitting: Gynecology

## 2010-12-31 ENCOUNTER — Encounter: Payer: Self-pay | Admitting: Gynecology

## 2010-12-31 VITALS — BP 150/90 | Ht 63.0 in | Wt 255.0 lb

## 2010-12-31 DIAGNOSIS — N9489 Other specified conditions associated with female genital organs and menstrual cycle: Secondary | ICD-10-CM

## 2010-12-31 DIAGNOSIS — N930 Postcoital and contact bleeding: Secondary | ICD-10-CM

## 2010-12-31 DIAGNOSIS — B373 Candidiasis of vulva and vagina: Secondary | ICD-10-CM

## 2010-12-31 DIAGNOSIS — N83209 Unspecified ovarian cyst, unspecified side: Secondary | ICD-10-CM

## 2010-12-31 DIAGNOSIS — N898 Other specified noninflammatory disorders of vagina: Secondary | ICD-10-CM

## 2010-12-31 MED ORDER — TERCONAZOLE 0.4 % VA CREA: 1.0000 | TOPICAL_CREAM | Freq: Every day | VAGINAL | Status: DC

## 2010-12-31 NOTE — Progress Notes (Addendum)
New patient presents complaining of persistent vaginal discharge irritation. She also some lower pelvic discomfort recently was evaluated by Dr. Debby Bud and was treated for a bacterial infection. She said that she feels a little but better but still is having a lot of irritation and discomfort. She's noticed some postcoital spotting after intercourse which is new. She had an ultrasound ordered by Dr. Debby Bud that showed a small cyst at one 0.3 cm simple on the left ovary an endometrial echo of 7.7 mm. A Pap smear was done which showed some inflammatory changes but otherwise was negative.  Exam Breasts examined lying and sitting without masses retractions discharge adenopathy Abdomen soft nontender without masses guarding rebound organomegaly well-healed midline scar. Pelvic external BUS vagina with intense vulvitis and white discharge KOH wet prep done, cervix normal, uterus grossly normal midline mobile adnexa without gross masses or tenderness rectovaginal exam is normal exam is limited by abdominal girth  Assessment and plan: 1. Vulvovaginitis. Wet prep is positive for yeast we'll treat with Terazol 7 day cream follow up if symptoms persist or recur. 2. Postcoital bleeding. Probably related to the vulvovaginitis. Ultrasound did show an endometrial echo of 7.7 mm which is generous for post menopausal woman. I recommended the patient schedule a sonohysterogram to allow for endometrial evaluation and she agrees to do so. It also allows to follow up on her ovarian cyst. The differential including uterine cancer was reviewed with her and she understands importance of follow. 3. Left ovarian cyst. Probable physiologic/benign. We'll relook at it now with the sonohysterogram.

## 2011-01-04 ENCOUNTER — Encounter (HOSPITAL_COMMUNITY): Payer: Self-pay

## 2011-01-04 ENCOUNTER — Ambulatory Visit (HOSPITAL_COMMUNITY)
Admission: RE | Admit: 2011-01-04 | Discharge: 2011-01-04 | Disposition: A | Discharge status: Home / Self Care | Payer: Medicare Other | Source: Ambulatory Visit | Attending: Neurosurgery | Admitting: Neurosurgery

## 2011-01-04 ENCOUNTER — Encounter (HOSPITAL_COMMUNITY)
Admission: RE | Admit: 2011-01-04 | Discharge: 2011-01-04 | Disposition: A | Discharge status: Home / Self Care | Payer: Medicare Other | Source: Ambulatory Visit | Attending: Neurosurgery | Admitting: Neurosurgery

## 2011-01-04 DIAGNOSIS — Z01812 Encounter for preprocedural laboratory examination: Secondary | ICD-10-CM | POA: Insufficient documentation

## 2011-01-04 DIAGNOSIS — Z01818 Encounter for other preprocedural examination: Secondary | ICD-10-CM | POA: Insufficient documentation

## 2011-01-04 HISTORY — DX: Personal history of other venous thrombosis and embolism: Z86.718

## 2011-01-04 HISTORY — DX: Personal history of other diseases of the nervous system and sense organs: Z86.69

## 2011-01-04 HISTORY — DX: Shortness of breath: R06.02

## 2011-01-04 HISTORY — DX: Dorsalgia, unspecified: M54.9

## 2011-01-04 HISTORY — DX: Rash and other nonspecific skin eruption: R21

## 2011-01-04 HISTORY — DX: Other reaction to spinal and lumbar puncture: G97.1

## 2011-01-04 HISTORY — DX: Sleep apnea, unspecified: G47.30

## 2011-01-04 HISTORY — DX: Unspecified osteoarthritis, unspecified site: M19.90

## 2011-01-04 HISTORY — DX: Bronchitis, not specified as acute or chronic: J40

## 2011-01-04 HISTORY — DX: Gastro-esophageal reflux disease without esophagitis: K21.9

## 2011-01-04 HISTORY — DX: Other chronic pain: G89.29

## 2011-01-04 HISTORY — DX: Fibromyalgia: M79.7

## 2011-01-04 HISTORY — DX: Anxiety disorder, unspecified: F41.9

## 2011-01-04 HISTORY — DX: Candidiasis of vulva and vagina: B37.3

## 2011-01-04 HISTORY — DX: Family history of malignant neoplasm of digestive organs: Z80.0

## 2011-01-04 LAB — DIFFERENTIAL
Basophils Absolute: 0 10*3/uL (ref 0.0–0.1)
Basophils Relative: 0 % (ref 0–1)
Eosinophils Absolute: 0.3 10*3/uL (ref 0.0–0.7)
Monocytes Relative: 5 % (ref 3–12)
Neutro Abs: 5 10*3/uL (ref 1.7–7.7)
Neutrophils Relative %: 56 % (ref 43–77)

## 2011-01-04 LAB — BASIC METABOLIC PANEL
BUN: 11 mg/dL (ref 6–23)
Calcium: 9.5 mg/dL (ref 8.4–10.5)
Creatinine, Ser: 0.74 mg/dL (ref 0.50–1.10)
GFR calc Af Amer: >90 mL/min (ref >90)
GFR calc non Af Amer: 85 mL/min — ABNORMAL LOW (ref >90)
Glucose, Bld: 202 mg/dL — ABNORMAL HIGH (ref 70–99)

## 2011-01-04 LAB — CBC
Hemoglobin: 14.1 g/dL (ref 12.0–15.0)
MCH: 30.1 pg (ref 26.0–34.0)
MCHC: 34.1 g/dL (ref 30.0–36.0)
RDW: 13.8 % (ref 11.5–15.5)

## 2011-01-04 LAB — TYPE AND SCREEN: Antibody Screen: NEGATIVE

## 2011-01-04 NOTE — Pre-Procedure Instructions (Signed)
22 [name]  01/04/2011   Your procedure is scheduled on: Tues,Nov 20 @ 0730 Report to [facility] at 0530 AM.  Call this number if you have problems the morning of surgery: [phone]   Remember:   Do not eat food:After Midnight.  Do not drink clear liquids: 4 Hours before arrival.May have soda,tea,black coffee,apple/grape juice,broth or water up until 0130  Take these medicines the morning of surgery with A SIP OF WATER: Albuterol inhaler,Colchicine,Valium,Diltiazem,Tramadol(if needed),Bring your inhaler with you!!   Do not wear jewelry, make-up or nail polish.  Do not wear lotions, powders, or perfumes. You may wear deodorant.  Do not shave 48 hours prior to surgery.  Do not bring valuables to the hospital.  Contacts, dentures or bridgework may not be worn into surgery.  Leave suitcase in the car. After surgery it may be brought to your room.  For patients admitted to the hospital, checkout time is 11:00 AM the day of discharge.   Patients discharged the day of surgery will not be allowed to drive home.  Name and phone number of your driver:   Special Instructions: CHG Shower Use Special Wash: 1/2 bottle night before surgery and 1/2 bottle morning of surgery.   Please read over the following fact sheets that you were given: Pain Booklet, Coughing and Deep Breathing, MRSA Information and Surgical Site Infection Prevention

## 2011-01-08 ENCOUNTER — Encounter: Payer: Self-pay | Admitting: Gynecology

## 2011-01-08 ENCOUNTER — Ambulatory Visit: Payer: Medicare Other | Admitting: Gynecology

## 2011-01-08 ENCOUNTER — Other Ambulatory Visit: Payer: Medicare Other

## 2011-01-08 ENCOUNTER — Other Ambulatory Visit: Payer: Self-pay | Admitting: Gynecology

## 2011-01-08 ENCOUNTER — Ambulatory Visit (INDEPENDENT_AMBULATORY_CARE_PROVIDER_SITE_OTHER): Payer: Medicare Other | Admitting: Gynecology

## 2011-01-08 ENCOUNTER — Ambulatory Visit (INDEPENDENT_AMBULATORY_CARE_PROVIDER_SITE_OTHER): Payer: Medicare Other

## 2011-01-08 VITALS — BP 130/80

## 2011-01-08 DIAGNOSIS — N9489 Other specified conditions associated with female genital organs and menstrual cycle: Secondary | ICD-10-CM

## 2011-01-08 DIAGNOSIS — D259 Leiomyoma of uterus, unspecified: Secondary | ICD-10-CM

## 2011-01-08 DIAGNOSIS — N840 Polyp of corpus uteri: Secondary | ICD-10-CM

## 2011-01-08 DIAGNOSIS — N930 Postcoital and contact bleeding: Secondary | ICD-10-CM

## 2011-01-08 DIAGNOSIS — N950 Postmenopausal bleeding: Secondary | ICD-10-CM

## 2011-01-08 NOTE — Progress Notes (Signed)
Patient presents for sonohysterogram due to postmenopausal spotting and follow up on her left ovarian cyst that was seen on ultrasound by Dr. Debby Bud. Ultrasound initially shows endometrial echo at 8.8 mm 2 small probable leiomyoma left ovary visualized and normal right ovary was not visualized. Sonohysterogram was performed, sterile technique, easy catheter introduction, good distention with clear endometrial polyp. Endometrial sample was taken patient tolerated well.  Assessment and plan: Postmenopausal spotting, endometrial polyp. Patient is having cervical neck surgery this coming week. Assuming that her endometrial sample is benign then we'll plan on hysteroscopy D&C after she recovers from her neck surgery. I reviewed was involved with the surgery to include instrumentation, use of the resectoscope D&C portion. The risks of infection, prolonged antibiotics, bleeding, uterine perforation, damage to internal organs bowel bladder ureters vessels and nerves, major reparative surgery and future reparative surgery including ostomy formation was reviewed. Risk of distended media absorption leading to metabolic complications discussed. Patient is being seen for a number of issues and will need a medical clearance preoperatively we will move towards scheduling this surgery after she recovers from her disc surgery. Patient's comfortable with this plan she'll follow up for her endometrial biopsy beginning of next week.

## 2011-01-11 ENCOUNTER — Telehealth: Payer: Self-pay

## 2011-01-11 MED ORDER — CEFAZOLIN SODIUM 1-5 GM-% IV SOLN: 1.0000 g | INTRAVENOUS | Status: DC

## 2011-01-11 NOTE — Telephone Encounter (Signed)
I called to talk with patient about schedule Hysteroscopy, D&C.  Patient is scheduled for in the morning to have disc surgery.  This surgeon had told her she would need to wait for 7-8 weeks to have some knee replacement surgery she is needing.  She assumes she will need to wait that long for this surgery as well.  She will speak with her surgeon tomorrow and let him know about need for Hyst. D&C and that procedure takes about an hour and see what he recommends. Also, she has an appointment scheduled on Monday with Dr. Riley Kill and I told her that Dr. Velvet Bathe wanted her to get medical clearance from him for her surgery and suggested she ask him about that while she is there. I will fax him a note as well.  I told patient I will call her next Tuesday after she has time to talk with both doctors and I should have Jan schedule by then if indeed she does need to wait 7-8 weeks to schedule.

## 2011-01-12 ENCOUNTER — Encounter (HOSPITAL_COMMUNITY): Payer: Self-pay | Admitting: Certified Registered"

## 2011-01-12 ENCOUNTER — Inpatient Hospital Stay (HOSPITAL_COMMUNITY): Payer: Medicare Other | Admitting: Certified Registered"

## 2011-01-12 ENCOUNTER — Inpatient Hospital Stay (HOSPITAL_COMMUNITY): Payer: Medicare Other

## 2011-01-12 ENCOUNTER — Inpatient Hospital Stay (HOSPITAL_COMMUNITY)
Admission: RE | Admit: 2011-01-12 | Discharge: 2011-01-13 | DRG: 473 | Disposition: A | Discharge status: Home / Self Care | Payer: Medicare Other | Source: Ambulatory Visit | Attending: Neurosurgery | Admitting: Neurosurgery

## 2011-01-12 ENCOUNTER — Encounter (HOSPITAL_COMMUNITY)
Admission: RE | Disposition: A | Discharge status: Home / Self Care | Payer: Self-pay | Source: Ambulatory Visit | Attending: Neurosurgery

## 2011-01-12 ENCOUNTER — Encounter (HOSPITAL_COMMUNITY): Payer: Self-pay | Admitting: Neurosurgery

## 2011-01-12 DIAGNOSIS — E785 Hyperlipidemia, unspecified: Secondary | ICD-10-CM | POA: Diagnosis present

## 2011-01-12 DIAGNOSIS — M47812 Spondylosis without myelopathy or radiculopathy, cervical region: Principal | ICD-10-CM | POA: Diagnosis present

## 2011-01-12 DIAGNOSIS — M109 Gout, unspecified: Secondary | ICD-10-CM | POA: Diagnosis present

## 2011-01-12 DIAGNOSIS — K219 Gastro-esophageal reflux disease without esophagitis: Secondary | ICD-10-CM | POA: Diagnosis present

## 2011-01-12 DIAGNOSIS — B379 Candidiasis, unspecified: Secondary | ICD-10-CM | POA: Diagnosis present

## 2011-01-12 DIAGNOSIS — M4802 Spinal stenosis, cervical region: Secondary | ICD-10-CM | POA: Diagnosis present

## 2011-01-12 DIAGNOSIS — M1990 Unspecified osteoarthritis, unspecified site: Secondary | ICD-10-CM | POA: Diagnosis present

## 2011-01-12 DIAGNOSIS — Z79899 Other long term (current) drug therapy: Secondary | ICD-10-CM

## 2011-01-12 DIAGNOSIS — I10 Essential (primary) hypertension: Secondary | ICD-10-CM | POA: Diagnosis present

## 2011-01-12 DIAGNOSIS — Z85038 Personal history of other malignant neoplasm of large intestine: Secondary | ICD-10-CM

## 2011-01-12 DIAGNOSIS — J309 Allergic rhinitis, unspecified: Secondary | ICD-10-CM | POA: Diagnosis present

## 2011-01-12 DIAGNOSIS — K279 Peptic ulcer, site unspecified, unspecified as acute or chronic, without hemorrhage or perforation: Secondary | ICD-10-CM | POA: Diagnosis present

## 2011-01-12 DIAGNOSIS — G4730 Sleep apnea, unspecified: Secondary | ICD-10-CM | POA: Diagnosis present

## 2011-01-12 DIAGNOSIS — Z9849 Cataract extraction status, unspecified eye: Secondary | ICD-10-CM

## 2011-01-12 DIAGNOSIS — M5412 Radiculopathy, cervical region: Secondary | ICD-10-CM | POA: Diagnosis present

## 2011-01-12 DIAGNOSIS — IMO0001 Reserved for inherently not codable concepts without codable children: Secondary | ICD-10-CM | POA: Diagnosis present

## 2011-01-12 DIAGNOSIS — I4891 Unspecified atrial fibrillation: Secondary | ICD-10-CM | POA: Diagnosis present

## 2011-01-12 DIAGNOSIS — E119 Type 2 diabetes mellitus without complications: Secondary | ICD-10-CM | POA: Diagnosis present

## 2011-01-12 DIAGNOSIS — F411 Generalized anxiety disorder: Secondary | ICD-10-CM | POA: Diagnosis present

## 2011-01-12 DIAGNOSIS — Z7982 Long term (current) use of aspirin: Secondary | ICD-10-CM

## 2011-01-12 DIAGNOSIS — Z96659 Presence of unspecified artificial knee joint: Secondary | ICD-10-CM

## 2011-01-12 DIAGNOSIS — J45909 Unspecified asthma, uncomplicated: Secondary | ICD-10-CM | POA: Diagnosis present

## 2011-01-12 HISTORY — PX: ANTERIOR CERVICAL DECOMP/DISCECTOMY FUSION: SHX1161

## 2011-01-12 LAB — GLUCOSE, CAPILLARY

## 2011-01-12 SURGERY — ANTERIOR CERVICAL DECOMPRESSION/DISCECTOMY FUSION 2 LEVELS
Anesthesia: General | Site: Neck | Laterality: Bilateral | Wound class: Clean

## 2011-01-12 MED ORDER — DILTIAZEM HCL ER COATED BEADS 360 MG PO CP24
360.0000 mg | ORAL_CAPSULE | Freq: Every day | ORAL | Status: DC
  Administered 2011-01-13: 360 mg via ORAL
  Filled 2011-01-12 (×2): qty 1

## 2011-01-12 MED ORDER — ACARBOSE 25 MG PO TABS
25.0000 mg | ORAL_TABLET | Freq: Three times a day (TID) | ORAL | Status: DC
  Administered 2011-01-12 – 2011-01-13 (×2): 25 mg via ORAL
  Filled 2011-01-12 (×5): qty 1

## 2011-01-12 MED ORDER — ALBUTEROL SULFATE HFA 108 (90 BASE) MCG/ACT IN AERS
2.0000 | INHALATION_SPRAY | Freq: Four times a day (QID) | RESPIRATORY_TRACT | Status: DC | PRN
  Filled 2011-01-12: qty 6.7

## 2011-01-12 MED ORDER — THROMBIN 5000 UNITS EX KIT
PACK | CUTANEOUS | Status: DC | PRN
  Administered 2011-01-12: 1000 [IU] via TOPICAL

## 2011-01-12 MED ORDER — GLYCOPYRROLATE 0.2 MG/ML IJ SOLN
INTRAMUSCULAR | Status: DC | PRN
  Administered 2011-01-12: .4 mg via INTRAVENOUS

## 2011-01-12 MED ORDER — SODIUM CHLORIDE 0.9 % IR SOLN
Status: DC | PRN
  Administered 2011-01-12: 1000 mL

## 2011-01-12 MED ORDER — OXYCODONE-ACETAMINOPHEN 5-325 MG PO TABS
1.0000 | ORAL_TABLET | ORAL | Status: DC | PRN
  Administered 2011-01-12: 2 via ORAL
  Filled 2011-01-12 (×2): qty 2

## 2011-01-12 MED ORDER — GLIMEPIRIDE 4 MG PO TABS
4.0000 mg | ORAL_TABLET | Freq: Every day | ORAL | Status: DC
  Administered 2011-01-13: 4 mg via ORAL
  Filled 2011-01-12 (×2): qty 1

## 2011-01-12 MED ORDER — DIAZEPAM 5 MG PO TABS
5.0000 mg | ORAL_TABLET | Freq: Four times a day (QID) | ORAL | Status: DC | PRN
  Administered 2011-01-12: 5 mg via ORAL
  Filled 2011-01-12: qty 1

## 2011-01-12 MED ORDER — DIPHENHYDRAMINE HCL 25 MG PO CAPS
25.0000 mg | ORAL_CAPSULE | ORAL | Status: DC | PRN
  Administered 2011-01-12 – 2011-01-13 (×2): 25 mg via ORAL
  Filled 2011-01-12 (×2): qty 1

## 2011-01-12 MED ORDER — METHOCARBAMOL 500 MG PO TABS: 500.0000 mg | ORAL_TABLET | Freq: Four times a day (QID) | ORAL | Status: DC | PRN

## 2011-01-12 MED ORDER — DEXAMETHASONE SODIUM PHOSPHATE 10 MG/ML IJ SOLN
10.0000 mg | Freq: Once | INTRAMUSCULAR | Status: AC
  Administered 2011-01-12: 10 mg via INTRAVENOUS
  Filled 2011-01-12: qty 1

## 2011-01-12 MED ORDER — SUFENTANIL CITRATE 50 MCG/ML IV SOLN
INTRAVENOUS | Status: DC | PRN
  Administered 2011-01-12: 20 ug via INTRAVENOUS
  Administered 2011-01-12 (×3): 10 ug via INTRAVENOUS

## 2011-01-12 MED ORDER — CEFAZOLIN SODIUM-DEXTROSE 2-3 GM-% IV SOLR
2.0000 g | Freq: Once | INTRAVENOUS | Status: AC
  Administered 2011-01-12: 2 g via INTRAVENOUS
  Filled 2011-01-12: qty 50

## 2011-01-12 MED ORDER — MORPHINE SULFATE 2 MG/ML IJ SOLN: 0.0500 mg/kg | INTRAMUSCULAR | Status: DC | PRN

## 2011-01-12 MED ORDER — ROCURONIUM BROMIDE 100 MG/10ML IV SOLN
INTRAVENOUS | Status: DC | PRN
  Administered 2011-01-12: 50 mg via INTRAVENOUS

## 2011-01-12 MED ORDER — CEFAZOLIN SODIUM 1-5 GM-% IV SOLN
1.0000 g | Freq: Three times a day (TID) | INTRAVENOUS | Status: AC
  Administered 2011-01-12 (×2): 1 g via INTRAVENOUS
  Filled 2011-01-12 (×3): qty 50

## 2011-01-12 MED ORDER — INSULIN ASPART 100 UNIT/ML ~~LOC~~ SOLN
0.0000 [IU] | Freq: Three times a day (TID) | SUBCUTANEOUS | Status: DC
Start: 2011-01-12 — End: 2011-01-13
  Administered 2011-01-12: 11 [IU] via SUBCUTANEOUS
  Administered 2011-01-13: 5 [IU] via SUBCUTANEOUS
  Filled 2011-01-12: qty 3

## 2011-01-12 MED ORDER — LORAZEPAM 1 MG PO TABS: 1.0000 mg | ORAL_TABLET | Freq: Three times a day (TID) | ORAL | Status: DC | PRN

## 2011-01-12 MED ORDER — MENTHOL 3 MG MT LOZG: 1.0000 | LOZENGE | OROMUCOSAL | Status: DC | PRN

## 2011-01-12 MED ORDER — EPHEDRINE SULFATE 50 MG/ML IJ SOLN
INTRAMUSCULAR | Status: DC | PRN
  Administered 2011-01-12: 10 mg via INTRAVENOUS

## 2011-01-12 MED ORDER — PROMETHAZINE HCL 25 MG/ML IJ SOLN: 6.2500 mg | INTRAMUSCULAR | Status: DC | PRN

## 2011-01-12 MED ORDER — PROPOFOL 10 MG/ML IV EMUL
INTRAVENOUS | Status: DC | PRN
  Administered 2011-01-12: 200 mg via INTRAVENOUS

## 2011-01-12 MED ORDER — LOPERAMIDE HCL 2 MG PO CAPS
2.0000 mg | ORAL_CAPSULE | Freq: Four times a day (QID) | ORAL | Status: DC | PRN
  Filled 2011-01-12: qty 1

## 2011-01-12 MED ORDER — ASPIRIN EC 81 MG PO TBEC
81.0000 mg | DELAYED_RELEASE_TABLET | Freq: Every day | ORAL | Status: DC
  Filled 2011-01-12 (×2): qty 1

## 2011-01-12 MED ORDER — FUROSEMIDE 40 MG PO TABS
40.0000 mg | ORAL_TABLET | ORAL | Status: DC
  Filled 2011-01-12: qty 1

## 2011-01-12 MED ORDER — HYDROCODONE-ACETAMINOPHEN 5-325 MG PO TABS
1.0000 | ORAL_TABLET | ORAL | Status: DC | PRN
  Administered 2011-01-13: 2 via ORAL
  Filled 2011-01-12: qty 2

## 2011-01-12 MED ORDER — LACTATED RINGERS IV SOLN
INTRAVENOUS | Status: DC | PRN
  Administered 2011-01-12 (×2): via INTRAVENOUS

## 2011-01-12 MED ORDER — OXYCODONE-ACETAMINOPHEN 5-325 MG PO TABS
1.0000 | ORAL_TABLET | ORAL | Status: DC | PRN
  Administered 2011-01-12: 2 via ORAL
  Administered 2011-01-12: 1 via ORAL
  Filled 2011-01-12: qty 2

## 2011-01-12 MED ORDER — SODIUM CHLORIDE 0.9 % IJ SOLN: 3.0000 mL | INTRAMUSCULAR | Status: DC | PRN

## 2011-01-12 MED ORDER — PROMETHAZINE HCL 25 MG RE SUPP: 25.0000 mg | Freq: Four times a day (QID) | RECTAL | Status: DC | PRN

## 2011-01-12 MED ORDER — OXYCODONE-ACETAMINOPHEN 5-325 MG PO TABS: 1.0000 | ORAL_TABLET | ORAL | Status: AC | PRN

## 2011-01-12 MED ORDER — DOCUSATE SODIUM 100 MG PO CAPS
100.0000 mg | ORAL_CAPSULE | Freq: Two times a day (BID) | ORAL | Status: DC
  Administered 2011-01-12 – 2011-01-13 (×3): 100 mg via ORAL
  Filled 2011-01-12 (×3): qty 1

## 2011-01-12 MED ORDER — ZOLPIDEM TARTRATE 10 MG PO TABS: 10.0000 mg | ORAL_TABLET | Freq: Every evening | ORAL | Status: DC | PRN

## 2011-01-12 MED ORDER — MIDAZOLAM HCL 5 MG/5ML IJ SOLN
INTRAMUSCULAR | Status: DC | PRN
  Administered 2011-01-12: 2 mg via INTRAVENOUS

## 2011-01-12 MED ORDER — ONDANSETRON HCL 4 MG/2ML IJ SOLN: 4.0000 mg | INTRAMUSCULAR | Status: DC | PRN

## 2011-01-12 MED ORDER — HEMOSTATIC AGENTS (NO CHARGE) OPTIME
TOPICAL | Status: DC | PRN
  Administered 2011-01-12: 1 via TOPICAL

## 2011-01-12 MED ORDER — ACETAMINOPHEN 650 MG RE SUPP: 650.0000 mg | RECTAL | Status: DC | PRN

## 2011-01-12 MED ORDER — INSULIN ASPART 100 UNIT/ML ~~LOC~~ SOLN
0.0000 [IU] | Freq: Every day | SUBCUTANEOUS | Status: DC
  Administered 2011-01-12: 2 [IU] via SUBCUTANEOUS

## 2011-01-12 MED ORDER — NEOSTIGMINE METHYLSULFATE 1 MG/ML IJ SOLN
INTRAMUSCULAR | Status: DC | PRN
  Administered 2011-01-12: 3 mg via INTRAVENOUS

## 2011-01-12 MED ORDER — MEPERIDINE HCL 25 MG/ML IJ SOLN: 6.2500 mg | INTRAMUSCULAR | Status: DC | PRN

## 2011-01-12 MED ORDER — BENAZEPRIL HCL 20 MG PO TABS
20.0000 mg | ORAL_TABLET | Freq: Two times a day (BID) | ORAL | Status: DC
  Administered 2011-01-12 – 2011-01-13 (×3): 20 mg via ORAL
  Filled 2011-01-12 (×4): qty 1

## 2011-01-12 MED ORDER — HYDROMORPHONE HCL PF 1 MG/ML IJ SOLN
0.5000 mg | INTRAMUSCULAR | Status: DC | PRN
  Filled 2011-01-12: qty 1

## 2011-01-12 MED ORDER — ACETAMINOPHEN 325 MG PO TABS: 650.0000 mg | ORAL_TABLET | ORAL | Status: DC | PRN

## 2011-01-12 MED ORDER — MIDAZOLAM HCL 2 MG/2ML IJ SOLN: 0.5000 mg | Freq: Once | INTRAMUSCULAR | Status: DC | PRN

## 2011-01-12 MED ORDER — SODIUM CHLORIDE 0.9 % IR SOLN
Status: DC | PRN
  Administered 2011-01-12: 08:00:00

## 2011-01-12 MED ORDER — ONDANSETRON HCL 4 MG/2ML IJ SOLN
INTRAMUSCULAR | Status: DC | PRN
  Administered 2011-01-12: 4 mg via INTRAVENOUS

## 2011-01-12 MED ORDER — PHENOL 1.4 % MT LIQD
1.0000 | OROMUCOSAL | Status: DC | PRN
  Filled 2011-01-12: qty 177

## 2011-01-12 MED ORDER — ALUM & MAG HYDROXIDE-SIMETH 400-400-40 MG/5ML PO SUSP
30.0000 mL | Freq: Four times a day (QID) | ORAL | Status: DC | PRN
  Filled 2011-01-12: qty 30

## 2011-01-12 MED ORDER — HYDROMORPHONE HCL PF 1 MG/ML IJ SOLN
0.2500 mg | INTRAMUSCULAR | Status: DC | PRN
  Administered 2011-01-12 (×2): 0.5 mg via INTRAVENOUS
  Administered 2011-01-12: 0.25 mg via INTRAVENOUS
  Administered 2011-01-12: 0.5 mg via INTRAVENOUS
  Administered 2011-01-12: 0.25 mg via INTRAVENOUS

## 2011-01-12 SURGICAL SUPPLY — 56 items
BAG DECANTER FOR FLEXI CONT (MISCELLANEOUS) ×2 IMPLANT
BENZOIN TINCTURE PRP APPL 2/3 (GAUZE/BANDAGES/DRESSINGS) ×2 IMPLANT
BRUSH SCRUB EZ PLAIN DRY (MISCELLANEOUS) ×2 IMPLANT
BUR MATCHSTICK NEURO 3.0 LAGG (BURR) ×2 IMPLANT
CANISTER SUCTION 2500CC (MISCELLANEOUS) ×2 IMPLANT
CLOTH BEACON ORANGE TIMEOUT ST (SAFETY) ×2 IMPLANT
CONT SPEC 4OZ CLIKSEAL STRL BL (MISCELLANEOUS) ×2 IMPLANT
DRAIN SNY WOU 7FLT (WOUND CARE) ×2 IMPLANT
DRAPE C-ARM 42X72 X-RAY (DRAPES) ×4 IMPLANT
DRAPE LAPAROTOMY 100X72 PEDS (DRAPES) ×2 IMPLANT
DRAPE MICROSCOPE ZEISS OPMI (DRAPES) ×2 IMPLANT
DRAPE POUCH INSTRU U-SHP 10X18 (DRAPES) ×2 IMPLANT
DRILL BIT (BIT) ×2 IMPLANT
ELECT COATED BLADE 2.86 ST (ELECTRODE) ×2 IMPLANT
ELECT REM PT RETURN 9FT ADLT (ELECTROSURGICAL) ×2
ELECTRODE REM PT RTRN 9FT ADLT (ELECTROSURGICAL) ×1 IMPLANT
EVACUATOR SILICONE 100CC (DRAIN) ×2 IMPLANT
GAUZE SPONGE 4X4 12PLY STRL LF (GAUZE/BANDAGES/DRESSINGS) ×2 IMPLANT
GAUZE SPONGE 4X4 16PLY XRAY LF (GAUZE/BANDAGES/DRESSINGS) IMPLANT
GLOVE BIOGEL PI IND STRL 7.0 (GLOVE) ×2 IMPLANT
GLOVE BIOGEL PI INDICATOR 7.0 (GLOVE) ×2
GLOVE ECLIPSE 8.5 STRL (GLOVE) ×2 IMPLANT
GLOVE EXAM NITRILE LRG STRL (GLOVE) IMPLANT
GLOVE EXAM NITRILE MD LF STRL (GLOVE) IMPLANT
GLOVE EXAM NITRILE XL STR (GLOVE) IMPLANT
GLOVE EXAM NITRILE XS STR PU (GLOVE) IMPLANT
GLOVE SURG SS PI 6.5 STRL IVOR (GLOVE) ×6 IMPLANT
GOWN BRE IMP SLV AUR LG STRL (GOWN DISPOSABLE) IMPLANT
GOWN BRE IMP SLV AUR XL STRL (GOWN DISPOSABLE) ×2 IMPLANT
GOWN STRL REIN 2XL LVL4 (GOWN DISPOSABLE) IMPLANT
HEAD HALTER (SOFTGOODS) ×2 IMPLANT
HEMOSTAT SURGICEL 2X14 (HEMOSTASIS) IMPLANT
KIT BASIN OR (CUSTOM PROCEDURE TRAY) ×2 IMPLANT
KIT ROOM TURNOVER OR (KITS) ×2 IMPLANT
NEEDLE SPNL 20GX3.5 QUINCKE YW (NEEDLE) ×2 IMPLANT
NS IRRIG 1000ML POUR BTL (IV SOLUTION) ×2 IMPLANT
PACK LAMINECTOMY NEURO (CUSTOM PROCEDURE TRAY) ×2 IMPLANT
PAD ARMBOARD 7.5X6 YLW CONV (MISCELLANEOUS) ×6 IMPLANT
PLATE ATL VISION 42.5MM (Plate) ×2 IMPLANT
RUBBERBAND STERILE (MISCELLANEOUS) ×4 IMPLANT
SCREW VA ALT VISION 4.0X13 (Screw) ×10 IMPLANT
SCREW VA ATL VISION 4.5X17MM (Screw) ×2 IMPLANT
SPACER BONE CORNERSTONE 6X14 (Orthopedic Implant) ×2 IMPLANT
SPACER BONE CORNERSTONE 7X14 (Orthopedic Implant) ×2 IMPLANT
SPONGE GAUZE 4X4 12PLY (GAUZE/BANDAGES/DRESSINGS) ×2 IMPLANT
SPONGE INTESTINAL PEANUT (DISPOSABLE) ×2 IMPLANT
SPONGE SURGIFOAM ABS GEL SZ50 (HEMOSTASIS) ×2 IMPLANT
STRIP CLOSURE SKIN 1/2X4 (GAUZE/BANDAGES/DRESSINGS) ×2 IMPLANT
SUT PDS AB 5-0 P3 18 (SUTURE) ×2 IMPLANT
SUT VIC AB 3-0 SH 8-18 (SUTURE) ×2 IMPLANT
SYR 20ML ECCENTRIC (SYRINGE) ×2 IMPLANT
TAPE CLOTH 4X10 WHT NS (GAUZE/BANDAGES/DRESSINGS) ×2 IMPLANT
TAPE HYPAFIX 4 X10 (GAUZE/BANDAGES/DRESSINGS) ×2 IMPLANT
TOWEL OR 17X24 6PK STRL BLUE (TOWEL DISPOSABLE) ×2 IMPLANT
TOWEL OR 17X26 10 PK STRL BLUE (TOWEL DISPOSABLE) ×2 IMPLANT
WATER STERILE IRR 1000ML POUR (IV SOLUTION) ×2 IMPLANT

## 2011-01-12 NOTE — H&P (Signed)
[name] is an 70 y.o. female.   Chief Complaint: Neck pain HPI: 70 year old female of neck pain with radiation to her left upper extremity. Pain has associated numbness and paresthesias involving her left C6 and C7 dermatomes. She has failed conservative management her workup demonstrates evidence of cervical spondylosis with stenosis causing compression exiting C6 and C7 nerve roots on the left patient presents now for 2 level anterior cervical decompression and fusion.  Past Medical History  Diagnosis Date  . Allergic rhinitis   . Asthma   . Gout   . Hypertension   . Peptic ulcer disease   . History of PSVT (paroxysmal supraventricular tachycardia)   . Colon cancer 2007    s/p surgery and chemo  . Hx of colonoscopy   . HLD (hyperlipidemia)   . DJD (degenerative joint disease)   . History of blood clots     right lung in early 90's  . Atrial fibrillation     pt hasn't been on Coumadin;cardiologist is  Dr.Stuckey  . Sleep apnea     doesn't use CPAP  . Shortness of breath     pt states that she can be sitting as well as exertion and get short of breath  . Bronchitis     last time a year ago  . History of migraine headaches     last migraine 64yrs ago;Pt states she does get sinus headaches  . Fibromyalgia   . Osteoarthritis   . Chronic back pain     hx buldging disc  . Rash     on neck and started after she started taking her beta blocker 12yrs ago;medical MD has seen this  . GERD (gastroesophageal reflux disease)     uses Rolaids prn  . Gastric ulcer   . Hemorrhoids   . Family hx of colon cancer   . Colon cancer   . Diabetes mellitus     glimepiride  . Anxiety     and panic attacks;pt states that she is claustrophobic  . Spinal headache     after 2nd child  . Yeast infection of the vagina     being treated for this now by dr.fontaine    Past Surgical History  Procedure Date  . Caesarean section 1966/69/72    x 3  . Heel spur surgery 1992    x 2  . Ankle  surgery     tumor-benign removed left ankle  . Lumbar disc surgery 2008  . Colectomy 2007    colon cancer  . Pyelonidal cystectomy     at age 6  . Knee arthrosocpy     bil;couple of years before knee replacement  . Ganglion cyst excision > 69yrs ago    removed from left pointer finger  . Cataract surgery 2010  . Tubal ligation 1972  . Cardiac catheterization 90's and 2005  . Port a cath placed 2008  . Port a cath removed 2008  . Knee surgery     left TKA    Family History  Problem Relation Age of Onset  . Coronary artery disease Other   . Heart attack Mother   . Stroke Mother   . Hypertension Mother   . Heart disease Father   . Hypertension Sister   . Cancer Sister     left kidney cancer  . Cancer Brother     bladder  . Cancer Brother     liver, spread to colon  . Anesthesia problems Neg Hx   .  Hypotension Neg Hx   . Malignant hyperthermia Neg Hx   . Pseudochol deficiency Neg Hx    Social History:  reports that she has quit smoking. Her smoking use included Cigarettes. She has a 45 pack-year smoking history. She has never used smokeless tobacco. She reports that she does not drink alcohol or use illicit drugs.  Allergies:  Allergies  Allergen Reactions  . Morphine     REACTION: Reaction not known  . Nifedipine     REACTION: hives and itching  . Piroxicam     REACTION: itching and hives    Medications Prior to Admission  Medication Dose Route Frequency Provider Last Rate Last Dose  . ceFAZolin (ANCEF) IVPB 2 g/50 mL premix  2 g Intravenous Once Lora Poteet Seay, PHARMD      . dexamethasone (DECADRON) injection 10 mg  10 mg Intravenous Once [employer]      . DISCONTD: ceFAZolin (ANCEF) IVPB 1 g/50 mL premix  1 g Intravenous 60 min Pre-Op Kamsiyochukwu Spickler A Torey Regan       Medications Prior to Admission  Medication Sig Dispense Refill  . albuterol (PROVENTIL HFA;VENTOLIN HFA) 108 (90 BASE) MCG/ACT inhaler Inhale 2 puffs into the lungs every 6 (six) hours as needed.  Shortness of breath      . BAYER CONTOUR TEST test strip USE AS DIRECTED (DX. 250.00)  50 strip  2  . colchicine 0.6 MG tablet Take 0.6 mg by mouth daily as needed. gout      . diazepam (VALIUM) 5 MG tablet Take 5 mg by mouth every 6 (six) hours as needed. anxiety      . furosemide (LASIX) 40 MG tablet 40 mg every other day. Every other day      . glimepiride (AMARYL) 4 MG tablet TAKE 1 TABLET BY MOUTH ONCE A DAY  30 tablet  4  . indomethacin (INDOCIN) 25 MG capsule TAKE ONE CAPSULE BY MOUTH 3 TIMES A DAY AS NEEDED FOR GOUT PAIN  30 capsule  1  . LORazepam (ATIVAN) 1 MG tablet Take 1 mg by mouth every 8 (eight) hours as needed. anxiety      . methocarbamol (ROBAXIN) 500 MG tablet Take 500 mg by mouth 4 (four) times daily as needed. Muscle spasm, pain      . MICROLET LANCETS MISC by Does not apply route.       . promethazine (PHENERGAN) 25 MG suppository UNWRAP AND INSERT 1 SUPPSOITORY INTO RECTUM EVERY 6 HOURS  12 suppository  0  . traMADol (ULTRAM) 50 MG tablet Take 50 mg by mouth every 8 (eight) hours as needed. pain      . aspirin EC 81 MG tablet Take 1 tablet (81 mg total) by mouth daily.      [name] loperamide (IMODIUM) 2 MG capsule Take 2 mg by mouth 4 (four) times daily as needed. Loose stools      . promethazine-codeine (PHENERGAN WITH CODEINE) 6.25-10 MG/5ML syrup Take 5 mLs by mouth every 6 (six) hours as needed. nausea        Results for orders placed during the hospital encounter of 01/12/11 (from the past 48 hour(s))  GLUCOSE, CAPILLARY     Status: Abnormal   Collection Time   01/12/11  5:39 AM      Component Value Range Comment   Glucose-Capillary 136 (*) 70 - 99 (mg/dL)    No results found.  Review of Systems  All other systems reviewed and are negative.  Blood pressure 139/64, pulse 69, temperature 98 F (36.7 C), temperature source Oral, resp. rate 16, SpO2 95.00%. Physical Exam  Nursing note and vitals reviewed. Constitutional: She is oriented to person, place, and  time. She appears well-developed and well-nourished.  HENT:  Head: Normocephalic and atraumatic.  Right Ear: External ear normal.  Left Ear: External ear normal.  Nose: Nose normal.  Mouth/Throat: Oropharynx is clear and moist.  Eyes: Conjunctivae and EOM are normal. Pupils are equal, round, and reactive to light.  Neck: Normal range of motion. Neck supple. No tracheal deviation present. No thyromegaly present.  Cardiovascular: Normal rate, regular rhythm, normal heart sounds and intact distal pulses.   No murmur heard. Respiratory: Effort normal and breath sounds normal. No respiratory distress. She has no wheezes.  GI: Soft. Bowel sounds are normal. She exhibits no distension. There is no tenderness.  Musculoskeletal: Normal range of motion. She exhibits no edema and no tenderness.  Neurological: She is alert and oriented to person, place, and time. She has normal reflexes. She displays normal reflexes. No cranial nerve deficit. She exhibits normal muscle tone. Coordination normal.  Skin: Skin is warm and dry.  Psychiatric: She has a normal mood and affect. Her behavior is normal. Judgment and thought content normal.   Spurling's maneuver is positive towards the left. Motor examination reveals trace weakness involving her left-sided wrist extensors and left-sided triceps muscle group. Sensory examination was decreased vision given written light touch her left C6 and C7 dermatomes.  Assessment/Plan C5-6 and C6-7 spondylosis with radiculopathy. Plan is C5-6 C6-7 anterior cervical discectomy and fusion with allograft and anterior plating. Risks and benefits have been explained patient has been given up he has questions. She wishes to proceed.  Noeh Sparacino A 01/12/2011, 7:42 AM

## 2011-01-12 NOTE — Anesthesia Preprocedure Evaluation (Addendum)
Anesthesia Evaluation  Patient identified by MRN, date of birth, ID band Patient awake    Reviewed: Allergy & Precautions, H&P , NPO status , Patient's Chart, lab work & pertinent test results  History of Anesthesia Complications (+) DIFFICULT AIRWAY  Airway Mallampati: II      Dental   Pulmonary shortness of breath, with exertion and lying, asthma , sleep apnea ,  clear to auscultation        Cardiovascular hypertension, Pt. on medications Regular Normal    Neuro/Psych  Headaches,  Neuromuscular disease    GI/Hepatic PUD, GERD-  Controlled,  Endo/Other  Diabetes mellitus-, Type 2, Oral Hypoglycemic AgentsMorbid obesity  Renal/GU      Musculoskeletal  (+) Fibromyalgia -  Abdominal   Peds  Hematology negative hematology ROS (+)   Anesthesia Other Findings   Reproductive/Obstetrics                         Anesthesia Physical Anesthesia Plan  ASA: III  Anesthesia Plan: General   Post-op Pain Management:    Induction: Intravenous and Cricoid pressure planned  Airway Management Planned: Oral ETT  Additional Equipment:   Intra-op Plan:   Post-operative Plan: Extubation in OR  Informed Consent: I have reviewed the patients History and Physical, chart, labs and discussed the procedure including the risks, benefits and alternatives for the proposed anesthesia with the patient or authorized representative who has indicated his/her understanding and acceptance.   Dental advisory given  Plan Discussed with: Anesthesiologist and CRNA  Anesthesia Plan Comments:        Anesthesia Quick Evaluation

## 2011-01-12 NOTE — Anesthesia Postprocedure Evaluation (Signed)
  Anesthesia Post-op Note  Patient: [name]  Procedure(s) Performed:  ANTERIOR CERVICAL DECOMPRESSION/DISCECTOMY FUSION 2 LEVELS - Cervical five-six, six-seven anterior cervical discectomy and fusion with allograft and plating  Patient Location: PACU  Anesthesia Type: General  Level of Consciousness: awake  Airway and Oxygen Therapy: Patient Spontanous Breathing  Post-op Pain: mild  Post-op Assessment: Post-op Vital signs reviewed  Post-op Vital Signs: stable  Complications: No apparent anesthesia complications

## 2011-01-12 NOTE — Discharge Summary (Signed)
Physician Discharge Summary  Patient ID: MEELAH TALLO MRN: [id number] DOB/AGE: 68-Sep-1942 68 y.o.  Admit date: 01/12/2011 Discharge date: 01/12/2011  Admission Diagnoses:  Discharge Diagnoses:  Active Problems:  * No active hospital problems. *    Discharged Condition: good  Hospital Course: Patient admitted to the hospital where she underwent an uncomplicated 2 level anterior cervical decompression and fusion. Postoperative patient done well. She's had improvement in her preoperative neck and hypertrophy pain. Discharge home.  Consults: none  Significant Diagnostic Studies: None  Treatments: surgery: C5-6 C6-7 anterior cervical discectomy and fusion with allograft and are pointing.  Discharge Exam: Blood pressure 134/66, pulse 74, temperature 98.3 F (36.8 C), temperature source Axillary, resp. rate 18, SpO2 95.00%. Awake alert oriented and appropriate. Cranial nerve function is intact. Motor and sensory function of the extremities is normal. Deep first normal active. There is also long track signs.  Disposition: Home or Self Care  Discharge Orders    Future Appointments: Provider: Department: Dept Phone: Center:   01/18/2011 1:45 PM Shawnie Pons, MD [facility] [phone] [facility]     Current Discharge Medication List    CONTINUE these medications which have NOT CHANGED   Details  albuterol (PROVENTIL HFA;VENTOLIN HFA) 108 (90 BASE) MCG/ACT inhaler Inhale 2 puffs into the lungs every 6 (six) hours as needed. Shortness of breath    BAYER CONTOUR TEST test strip USE AS DIRECTED (DX. 250.00) Qty: 50 strip, Refills: 2    benazepril (LOTENSIN) 20 MG tablet Take 1 tablet (20 mg total) by mouth 2 (two) times daily. Qty: 60 tablet, Refills: 11   Associated Diagnoses: Essential hypertension, benign; Atrial fibrillation    colchicine 0.6 MG tablet Take 0.6 mg by mouth daily as needed. gout    diazepam (VALIUM) 5 MG tablet Take 5 mg by mouth every 6 (six)  hours as needed. anxiety    diltiazem (CARDIZEM CD) 360 MG 24 hr capsule Take 360 mg by mouth daily.      furosemide (LASIX) 40 MG tablet 40 mg every other day. Every other day    glimepiride (AMARYL) 4 MG tablet TAKE 1 TABLET BY MOUTH ONCE A DAY Qty: 30 tablet, Refills: 4    indomethacin (INDOCIN) 25 MG capsule TAKE ONE CAPSULE BY MOUTH 3 TIMES A DAY AS NEEDED FOR GOUT PAIN Qty: 30 capsule, Refills: 1    LORazepam (ATIVAN) 1 MG tablet Take 1 mg by mouth every 8 (eight) hours as needed. anxiety    methocarbamol (ROBAXIN) 500 MG tablet Take 500 mg by mouth 4 (four) times daily as needed. Muscle spasm, pain    MICROLET LANCETS MISC by Does not apply route.     oxyCODONE-acetaminophen (PERCOCET) 5-325 MG per tablet Take 1 tablet by mouth every 4 (four) hours as needed. pain     promethazine (PHENERGAN) 25 MG suppository UNWRAP AND INSERT 1 SUPPSOITORY INTO RECTUM EVERY 6 HOURS Qty: 12 suppository, Refills: 0    traMADol (ULTRAM) 50 MG tablet Take 50 mg by mouth every 8 (eight) hours as needed. pain    acarbose (PRECOSE) 25 MG tablet Take 1 tablet (25 mg total) by mouth 3 (three) times daily with meals. Qty: 90 tablet, Refills: 11    aspirin EC 81 MG tablet Take 1 tablet (81 mg total) by mouth daily.    loperamide (IMODIUM) 2 MG capsule Take 2 mg by mouth 4 (four) times daily as needed. Loose stools    promethazine-codeine (PHENERGAN WITH CODEINE) 6.25-10 MG/5ML syrup Take 5 mLs  by mouth every 6 (six) hours as needed. nausea       Follow-up Information    Follow up with Jaedah Lords A. Call in 1 week.   Contact information:   [address] [place] [address] [phone]          Signed: Temple Pacini 01/12/2011, 5:41 PM

## 2011-01-12 NOTE — Transfer of Care (Signed)
Immediate Anesthesia Transfer of Care Note  Patient: [name]  Procedure(s) Performed:  ANTERIOR CERVICAL DECOMPRESSION/DISCECTOMY FUSION 2 LEVELS - Cervical five-six, six-seven anterior cervical discectomy and fusion with allograft and plating  Patient Location: PACU  Anesthesia Type: General  Level of Consciousness: awake, oriented, sedated, patient cooperative and responds to stimulation  Airway & Oxygen Therapy: Patient Spontanous Breathing and Patient connected to face mask oxygen  Post-op Assessment: Report given to PACU RN, Post -op Vital signs reviewed and stable and Patient moving all extremities  Post vital signs: Reviewed and stable  Complications: No apparent anesthesia complications

## 2011-01-12 NOTE — Op Note (Signed)
Date of procedure:  01/12/2011  Date of dictation:  01/12/2011  Service: Neurosurgery  Preoperative diagnosis:  C5-6 C6-7 spondylosis with stenosis and radiculopathy.  Postoperative diagnosis:  same  Procedure Name:  C5-6 and C6-7 anterior cervical discectomy and fusion with allograft and anterior plating.  Surgeon:Malayia Spizzirri A.Nikitta Sobiech, M.D.  Asst. Surgeon:  Gerlene Fee  Anesthesia: General  Indication: patient 70 year old female history of neck and left upper chin pain. Workup demonstrates evidence of significant spondylosis and stenosis at C5-6 and C6-7. Patient presents now for 2 level anterior cervical decompression and fusion.  Operative note: the patient is placed on the operative table in the supine position. After an adequate level of anesthesia is achieved, the patient's position with her Extended and held in place with halter traction. Patient's anterior cervical region was prepped and draped sterilely. 10 blade used to make a linear  skin incision overlying the C6 vertebral level. This carried down sharply to the platysma.Platysma was then divided vertically and dissection proceeded along the medial border of the sternocleidomastoid muscle and carotid sheath. Trachea and esophagus were mobilized and retracted towards the left. Prevertebral fascia stripped off the anterior spinal column. Longus colli muscles and elevated bilaterally using the cautery. Deep self retaining retractors placed intraoperative  Fluoroscopy was used and the level was confirmed.  C5-6 and C6-7 disc spaces were then incised with a 15 blade in a rectangular fashion. A wide disc space clear was achieved using pituitary rongeurs forward and backward angled Carlen curettes Kerrison rongeurs and a high-speed drill. All elements of the disc removed down to the level of the posterior annulus. Microscope was then brought into the field and used throughout the discectomy. Remaining aspects of annulus and osteophytes removed using a  high-speed drill down to the posterior  Longitudinal ligament. Posterior lateral 2 lm ligament was then elevated and resected in a piecemeal fashion. Underlying thecal sac was then identified. Wide central decompressions then performed undercutting the bodies of C5 and C6. Decompression then proceeded out each neural foramen. Wide anterior foraminotomies were then performed on course the exiting C6 nerve roots bilaterally. At this point a very thorough decompression achieved. There is no evidence of injury to thecal sac and nerve root. The procedure then repeated at C6-7 again without complication. Wound was irrigated with antibiotic solution. Gelfoam was placed topically for hemostasis which Ascent be good. Cornerstone allograft wedges were then impacted in place recessed roughly 1 mm from the anterior cortical margin at both levels. 42 mm Atlantis anterior cervical plate was then placed over the C5-C6 and C7 levels. This is an attachment or fluoroscopic guidance using 13 motor verbal and will screws. All 4 screws final tightening down to be solidly within bone. Locking screws engaged all 3 levels. Final images revealed good position of the bone graft and hardware with proper upper level and normal lamina spine. Wound is irrigated one final time. A 7 mm Jackson-Pratt drain was placed in the deep prevertebral space. Wounds and close in layers. Steri-Strips triggers were applied. No apparent outpatient care patient of her well and she returns to the recovery room postop.

## 2011-01-12 NOTE — Brief Op Note (Signed)
01/12/2011  9:29 AM  PATIENT:  [name]  70 y.o. female  PRE-OPERATIVE DIAGNOSIS:  Cervical five-six, six-seven stenosis  POST-OPERATIVE DIAGNOSIS:  Cervical five-six, six-seven stenosis  PROCEDURE:  Procedure(s): ANTERIOR CERVICAL DECOMPRESSION/DISCECTOMY FUSION 2 LEVELS  SURGEON:  Surgeon(s): Sherilyn Cooter A Kristeena Meineke [employer] Kritzer  PHYSICIAN ASSISTANT:   ASSISTANTS:    ANESTHESIA:   none  EBL:  Total I/O In: 1000 [I.V.:1000] Out: -   BLOOD ADMINISTERED:none  DRAINS: ( ) Jackson-Pratt drain(s) with closed bulb suction in the     LOCAL MEDICATIONS USED:  NONE  SPECIMEN:  No Specimen  DISPOSITION OF SPECIMEN:  N/A  COUNTS:  YES  TOURNIQUET:  * No tourniquets in log *  DICTATION: .Dragon Dictation  PLAN OF CARE: Admit to inpatient   PATIENT DISPOSITION:  PACU - hemodynamically stable.   Delay start of Pharmacological VTE agent (>24hrs) due to surgical blood loss or risk of bleeding:  yes

## 2011-01-13 ENCOUNTER — Telehealth: Payer: Self-pay

## 2011-01-13 ENCOUNTER — Encounter (HOSPITAL_COMMUNITY): Payer: Self-pay | Admitting: Neurosurgery

## 2011-01-13 LAB — GLUCOSE, CAPILLARY: Glucose-Capillary: 206 mg/dL — ABNORMAL HIGH (ref 70–99)

## 2011-01-13 MED ORDER — MISOPROSTOL 200 MCG PO TABS: ORAL_TABLET | ORAL | Status: AC

## 2011-01-13 NOTE — Telephone Encounter (Signed)
Patient called ready to schedule surgery.  She had her disc surgery this am and surgeon told her 3-5 weeks for surgery.  Patient wants to schedule Dec 28 so we scheduled her at 7:30am.  Preop consult with Dr. Velvet Bathe will be 02/11/11 2:30pm.  She knows that she needs to talk with Dr. Riley Kill when she sees him on Monday regarding medical clearance.  I told her about the need for the Cytotec tab vag hs before surgery and escribed that for her.    She was informed per Dr. Velvet Bathe that her path report was benign results however it indicated that specimen was not obtained from the upper part of the endometrium. Dr. Velvet Bathe rec she proceed with D&C, Hyst. And no need to re-biopsy in office.

## 2011-01-18 ENCOUNTER — Ambulatory Visit: Payer: Medicare Other | Admitting: Cardiology

## 2011-01-19 MED FILL — Sodium Chloride IV Soln 0.9%: INTRAVENOUS | Qty: 500 | Status: AC

## 2011-01-21 ENCOUNTER — Ambulatory Visit: Payer: Medicare Other | Admitting: Cardiology

## 2011-02-03 ENCOUNTER — Encounter (HOSPITAL_COMMUNITY): Payer: Self-pay

## 2011-02-05 ENCOUNTER — Telehealth: Payer: Self-pay

## 2011-02-05 NOTE — Telephone Encounter (Signed)
I left message on patient's ans machine to please call me and let me know if she has gotten medical clearance for her surgery Dec 28th.

## 2011-02-09 ENCOUNTER — Encounter (HOSPITAL_COMMUNITY): Payer: Self-pay

## 2011-02-09 ENCOUNTER — Encounter (HOSPITAL_COMMUNITY)
Admission: RE | Admit: 2011-02-09 | Discharge: 2011-02-09 | Disposition: A | Discharge status: Home / Self Care | Payer: Medicare Other | Source: Ambulatory Visit | Attending: Gynecology | Admitting: Gynecology

## 2011-02-09 LAB — CBC
HCT: 42 % (ref 36.0–46.0)
Hemoglobin: 14.1 g/dL (ref 12.0–15.0)
MCH: 29.6 pg (ref 26.0–34.0)
MCHC: 33.6 g/dL (ref 30.0–36.0)
MCV: 88.2 fL (ref 78.0–100.0)
RBC: 4.76 MIL/uL (ref 3.87–5.11)

## 2011-02-09 LAB — COMPREHENSIVE METABOLIC PANEL
BUN: 11 mg/dL (ref 6–23)
CO2: 25 mEq/L (ref 19–32)
Calcium: 9.5 mg/dL (ref 8.4–10.5)
Creatinine, Ser: 0.79 mg/dL (ref 0.50–1.10)
GFR calc Af Amer: >90 mL/min (ref >90)
GFR calc non Af Amer: 84 mL/min — ABNORMAL LOW (ref >90)
Glucose, Bld: 189 mg/dL — ABNORMAL HIGH (ref 70–99)
Total Protein: 6.9 g/dL (ref 6.0–8.3)

## 2011-02-09 NOTE — Pre-Procedure Instructions (Signed)
Pt had recent cervical neck surgery. Care needs to be taken in moving and positioning her head.

## 2011-02-09 NOTE — Patient Instructions (Signed)
YOUR PROCEDURE IS SCHEDULED ON:02/19/11  ENTER THROUGH THE MAIN ENTRANCE OF [facility] AT:6 am  USE DESK PHONE AND DIAL [id number] TO INFORM us OF YOUR ARRIVAL  CALL [phone] IF YOU HAVE ANY QUESTIONS OR PROBLEMS PRIOR TO YOUR ARRIVAL.  REMEMBER: DO NOT EAT OR DRINK AFTER MIDNIGHT :Thursday  SPECIAL INSTRUCTIONS: bring inhaler to hospital   YOU MAY BRUSH YOUR TEETH THE MORNING OF SURGERY   TAKE THESE MEDICINES THE DAY OF SURGERY WITH SIP OF WATER:Diltiazem, Benazepril   DO NOT WEAR JEWELRY, EYE MAKEUP, LIPSTICK OR DARK FINGERNAIL POLISH DO NOT WEAR LOTIONS OR DEODORANT DO NOT SHAVE FOR 48 HOURS PRIOR TO SURGERY  YOU WILL NOT BE ALLOWED TO DRIVE YOURSELF HOME.  NAME OF DRIVER: Windy Fast- spouse- [phone]

## 2011-02-10 ENCOUNTER — Encounter: Payer: Self-pay | Admitting: Cardiology

## 2011-02-10 ENCOUNTER — Ambulatory Visit (INDEPENDENT_AMBULATORY_CARE_PROVIDER_SITE_OTHER): Payer: Medicare Other | Admitting: Cardiology

## 2011-02-10 VITALS — BP 156/88 | HR 80 | Ht 63.0 in | Wt 252.0 lb

## 2011-02-10 DIAGNOSIS — Z01818 Encounter for other preprocedural examination: Secondary | ICD-10-CM | POA: Insufficient documentation

## 2011-02-10 DIAGNOSIS — I4891 Unspecified atrial fibrillation: Secondary | ICD-10-CM

## 2011-02-10 DIAGNOSIS — E1169 Type 2 diabetes mellitus with other specified complication: Secondary | ICD-10-CM | POA: Insufficient documentation

## 2011-02-10 DIAGNOSIS — E785 Hyperlipidemia, unspecified: Secondary | ICD-10-CM

## 2011-02-10 DIAGNOSIS — G4733 Obstructive sleep apnea (adult) (pediatric): Secondary | ICD-10-CM

## 2011-02-10 NOTE — Assessment & Plan Note (Signed)
The patient was sent for pre-op evaluation.  Because of her weight, and probably history of PE in past (I do not have any of these records), there is some increased risk, although undefined. Cardiac wise she has had cath in 2005 which was normal, and a non ischemic nuclear imaging study less than two years ago.  She has not had unstable symptoms.  She has had atrial fib with rapid VR which precipitated chest pain, but nothing while out.  Her exercise work load is difficult to judge, but has been limited due to her knee.   Her EF is normal, she does not have clinical CHF, nor does she have severe valvular disease.     As she has had fairly recent non invasive imaging, I am doubtful that it would provide useful further information at this point in time. She will be at increased risk for PAF, and her dilt should be continued after surgery.  If the operating surgery feels the procedure is necessary, there are not compelling reasons for her not to have this from a cardiac standpoint. I have explained this to her in detail.

## 2011-02-10 NOTE — Assessment & Plan Note (Addendum)
Given her CHADS score, she is at increased risk for CVA.  I have reviewed this with her in detail  If she is a candidate for anticoagulation, it is most likely that this would be indicated.  However, she has refused this in the past.  She has had some bleeding.  She does use ASA from time to time, but I did discuss with her the recommendations of ACCP.  Given her CHADS score, if her bleeding is not an issue, she would be most appropriate for anticoagulation.

## 2011-02-10 NOTE — Patient Instructions (Signed)
Your physician wants you to follow-up in: 3 MONTHS.  You will receive a reminder letter in the mail two months in advance. If you don't receive a letter, please call our office to schedule the follow-up appointment.  Your physician recommends that you continue on your current medications as directed. Please refer to the Current Medication list given to you today.  

## 2011-02-10 NOTE — Progress Notes (Signed)
HPI:  This 70 year female presents pre-op for evaluation for surgery.  She has had neck surgery, and now needs a D and C, possible TAH.   We previously saw her for recurrent atrial fib.   Our notes are well documented.  She has a CHADS 2 Vas score of 4, which gives her a risk of stroke of about 4% without anticoagulation.  We were seeing her in anticipation of knee surgery with Dr. Charlann Boxer.  Now Dr. Audie Box is operating for issues related to bleeding with intercourse.  No chest pain.  Has had fairly extensive work up cardiac wise in the near past. Please see the results section of this note.   She has been worked up for sleep apnea, the results of which are unknown to me, but she says it was mild, and she did not need CPAP.   She is not having chest pain.  She is not able to ambulate much.  She thinks she might go out of rhythm on occasion. None of this seems prolonged.    Current Outpatient Prescriptions  Medication Sig Dispense Refill  . albuterol (PROVENTIL HFA;VENTOLIN HFA) 108 (90 BASE) MCG/ACT inhaler Inhale 2 puffs into the lungs every 6 (six) hours as needed. Shortness of breath      . BAYER CONTOUR TEST test strip USE AS DIRECTED (DX. 250.00)  50 strip  2  . benazepril (LOTENSIN) 20 MG tablet Take 1 tablet (20 mg total) by mouth 2 (two) times daily.  60 tablet  11  . colchicine 0.6 MG tablet Take 0.6 mg by mouth daily as needed. gout      . diltiazem (CARDIZEM CD) 240 MG 24 hr capsule Take 240 mg by mouth daily.        . furosemide (LASIX) 40 MG tablet 40 mg every other day. Every other day      . glimepiride (AMARYL) 4 MG tablet TAKE 1 TABLET BY MOUTH ONCE A DAY  30 tablet  4  . indomethacin (INDOCIN) 25 MG capsule TAKE ONE CAPSULE BY MOUTH 3 TIMES A DAY AS NEEDED FOR GOUT PAIN  30 capsule  1  . loperamide (IMODIUM) 2 MG capsule Take 2 mg by mouth 4 (four) times daily as needed. Loose stools      . misoprostol (CYTOTEC) 200 MCG tablet Insert vaginally at bedtime night before surgery.  1  tablet  0  . oxyCODONE-acetaminophen (PERCOCET) 5-325 MG per tablet Take 1 tablet by mouth every 4 (four) hours as needed. pain       . promethazine (PHENERGAN) 25 MG suppository UNWRAP AND INSERT 1 SUPPSOITORY INTO RECTUM EVERY 6 HOURS  12 suppository  0  . promethazine (PHENERGAN) 25 MG tablet Take 25 mg by mouth every 6 (six) hours as needed. Only takes for severe nausea       . promethazine-codeine (PHENERGAN WITH CODEINE) 6.25-10 MG/5ML syrup Take 5 mLs by mouth every 6 (six) hours as needed. Cough       . traMADol (ULTRAM) 50 MG tablet Take 50 mg by mouth every 8 (eight) hours as needed. pain      . MICROLET LANCETS MISC by Does not apply route.         Allergies  Allergen Reactions  . Morphine     REACTION: Reaction not  Makes you feel bad all over  . Nifedipine     REACTION: hives and itching  . Piroxicam     REACTION: itching and hives  Past Medical History  Diagnosis Date  . Allergic rhinitis   . Asthma   . Gout   . Hypertension   . Peptic ulcer disease   . History of PSVT (paroxysmal supraventricular tachycardia)   . Colon cancer 2007    s/p surgery and chemo  . Hx of colonoscopy   . HLD (hyperlipidemia)   . DJD (degenerative joint disease)   . History of blood clots     right lung in early 90's  . Atrial fibrillation     pt hasn't been on Coumadin;cardiologist is  Dr.Aislynn Cifelli  . Sleep apnea     doesn't use CPAP  . Shortness of breath     pt states that she can be sitting as well as exertion and get short of breath  . Bronchitis     last time a year ago  . History of migraine headaches     last migraine 37yrs ago;Pt states she does get sinus headaches  . Fibromyalgia   . Osteoarthritis   . Chronic back pain     hx buldging disc  . Rash     on neck and started after she started taking her beta blocker 58yrs ago;medical MD has seen this  . GERD (gastroesophageal reflux disease)     uses Rolaids prn  . Gastric ulcer   . Hemorrhoids   . Family hx of colon  cancer   . Colon cancer   . Diabetes mellitus     glimepiride  . Anxiety     and panic attacks;pt states that she is claustrophobic  . Yeast infection of the vagina     being treated for this now by dr.fontaine  . Spinal headache     after 2nd child    Past Surgical History  Procedure Date  . Caesarean section 1966/69/72    x 3  . Heel spur surgery 1992    x 2  . Ankle surgery     tumor-benign removed left ankle  . Lumbar disc surgery 2008  . Colectomy 2007    colon cancer  . Pyelonidal cystectomy     at age 16  . Knee arthrosocpy     bil;couple of years before knee replacement  . Ganglion cyst excision > 58yrs ago    removed from left pointer finger  . Cataract surgery 2010  . Tubal ligation 1972  . Cardiac catheterization 90's and 2005  . Port a cath placed 2008  . Port a cath removed 2008  . Knee surgery     left TKA  . Anterior cervical decomp/discectomy fusion 01/12/2011    Procedure: ANTERIOR CERVICAL DECOMPRESSION/DISCECTOMY FUSION 2 LEVELS;  Surgeon: Kathaleen Maser Pool;  Location: [facility];  Service: Neurosurgery;  Laterality: Bilateral;  Cervical five-six, six-seven anterior cervical discectomy and fusion with allograft and plating    Family History  Problem Relation Age of Onset  . Coronary artery disease Other   . Heart attack Mother   . Stroke Mother   . Hypertension Mother   . Heart disease Father   . Hypertension Sister   . Cancer Sister     left kidney cancer  . Cancer Brother     bladder  . Cancer Brother     liver, spread to colon  . Anesthesia problems Neg Hx   . Hypotension Neg Hx   . Malignant hyperthermia Neg Hx   . Pseudochol deficiency Neg Hx     History   Social History  . Marital  Status: Married    Spouse Name: N/A    Number of Children: N/A  . Years of Education: N/A   Occupational History  . retired    Social History Main Topics  . Smoking status: Former Smoker -- 1.5 packs/day for 30 years    Types: Cigarettes  .  Smokeless tobacco: Never Used   Comment: quit 19 years ago  . Alcohol Use: No  . Drug Use: No  . Sexually Active: Yes -- Female partner(s)    Birth Control/ Protection: Post-menopausal   Other Topics Concern  . Not on file   Social History Narrative   Regular exercise- no.    ROS: Please see the HPI.  All other systems reviewed and negative.  PHYSICAL EXAM:  BP 156/88  Pulse 80  Ht 5\' 3"  (1.6 m)  Wt 114.306 kg (252 lb)  BMI 44.64 kg/m2  LMP 12/30/2000  General: Well developed, , obese,  in no acute distress. Head:  Normocephalic and atraumatic. Neck: no JVD Lungs: Clear to auscultation and percussion. Heart: Normal S1 and S2.  No murmur, rubs or gallops.  Abdomen:  Normal bowel sounds; soft; non tender; no organomegaly Pulses: Pulses normal in all 4 extremities. Extremities: No clubbing or cyanosis. No edema. Neurologic: Alert and oriented x 3.  EKG:  NSR. WNL.  CARDIAC CATH STUDY( 07/2003)  RESULTS: The aortic pressure was 165/95 with mean of 125. Left ventricular  pressure was 165/17.  There was no left main since there were separate ostia to the LAD and  circumflex arteries.  Left anterior descending artery: The left anterior descending artery gave  rise to a diagonal branch and three septal perforators. These and the LAD  proper were free of significant disease.  Circumflex artery: The circumflex artery gave rise to an atrial branch,  marginal branch and two posterior lateral branches. These also were free of  significant disease.  Right coronary artery: The right coronary artery gave rise to a conus  branch, right ventricular branch, posterior descending, and a posterior  lateral branch. These vessels were free of significant disease.  LEFT VENTRICULOGRAM: The left ventriculogram performed in the RAO  projection showed good wall motion with no areas of hypokinesis. The  estimated ejection fraction was 60%.  DISTAL AORTOGRAM: Distal aortogram was performed  which showed patent renal  arteries and no significant aortoiliac obstruction.  STRESS NUCLEAR  (05/2009)  QPS  Raw Data Images: Normal; no motion artifact; normal heart/lung ratio.  Stress Images: NI: Uniform and normal uptake of tracer in all myocardial segments.  Rest Images: Normal homogeneous uptake in all areas of the myocardium.  Subtraction (SDS): Normal  Transient Ischemic Dilatation: none (Normal <1.22)  Lung/Heart Ratio: .33 (Normal <0.45)  Quantitative Gated Spect Images  QGS EDV: 90 ml  QGS ESV: 31 ml  QGS EF: 66 %  QGS cine images: normal  Findings  Normal nuclear study  ECHO (09/16/10)  Study Conclusions  - Left ventricle: The cavity size was normal. Wall thickness was increased in a pattern of mild LVH. Systolic function was normal. The estimated ejection fraction was in the range of 55% to 60%. There was an increased relative contribution of atrial contraction to ventricular filling. - Aortic valve: Mild regurgitation.              ASSESSMENT AND PLAN:

## 2011-02-10 NOTE — Assessment & Plan Note (Signed)
Followed by Dr. Debby Bud.  Patient tells me she does not require treatment.

## 2011-02-11 ENCOUNTER — Institutional Professional Consult (permissible substitution): Payer: Medicare Other | Admitting: Gynecology

## 2011-02-11 ENCOUNTER — Other Ambulatory Visit: Payer: Self-pay | Admitting: Internal Medicine

## 2011-02-11 NOTE — Telephone Encounter (Signed)
Pt requesting refill on Tramadol 50 mg, please Advise refills

## 2011-02-11 NOTE — Telephone Encounter (Signed)
Last OV 12/25/2010 please advise.

## 2011-02-12 ENCOUNTER — Encounter: Payer: Self-pay | Admitting: Gynecology

## 2011-02-12 ENCOUNTER — Other Ambulatory Visit: Payer: Self-pay | Admitting: *Deleted

## 2011-02-12 ENCOUNTER — Other Ambulatory Visit: Payer: Self-pay | Admitting: Gynecology

## 2011-02-12 ENCOUNTER — Ambulatory Visit (INDEPENDENT_AMBULATORY_CARE_PROVIDER_SITE_OTHER): Payer: Medicare Other | Admitting: Gynecology

## 2011-02-12 DIAGNOSIS — N950 Postmenopausal bleeding: Secondary | ICD-10-CM

## 2011-02-12 MED ORDER — ENOXAPARIN SODIUM 40 MG/0.4ML ~~LOC~~ SOLN: 40.0000 mg | SUBCUTANEOUS | Status: DC

## 2011-02-12 NOTE — Patient Instructions (Addendum)
Follow up for surgery is scheduled.  Plan to use Lovenox 40 mg daily subcutaneous injections for 2 weeks postoperative.

## 2011-02-12 NOTE — Progress Notes (Signed)
[name] May 13, 1940 [id number]   Patient presents preoperative for her upcoming hysteroscopy D&C for postmenopausal bleeding and endometrial polyp.    Chief complaint: postmenopausal bleeding, endometrial polyp  History of present illness: 70 y.o.  Postmenopausal female initially presented complaining of vaginal discharge and post coital spotting. An ultrasound was ordered by Dr. Debby Bud which showed a small simple left ovarian cyst and an endometrial echo of 7.7 mm. Her Pap smear showed some inflammatory changes but was otherwise negative. She was referred for further evaluation.  A sonohysterogram was then performed which showed an endometrial echo of 8.8 mm with 2 small probable leiomyoma. Left ovary was visualized and normal right ovary was not visualized but no adnexal abnormalities seen. An endometrial polyp measuring 20 x 10 mm was identified an endometrial sample returned showing no endometrial tissue. Patient is admitted for hysteroscopy D&C removal of endometrial polyp. She has seen her cardiologist given her medical history and received a medical clearance.   Past medical history,surgical history, medications, allergies, family history and social history were all reviewed and documented in the EPIC chart. ROS:  Was performed and pertinent positives and negatives are included in the history of present illness.  Exam:  General: well developed, well nourished female, no acute distress HEENT: normal  Lungs: clear to auscultation without wheezing, rales or rhonchi  Cardiac: regular rate without rubs, murmurs or gallops  Abdomen: soft, nontender without masses, guarding, rebound, organomegaly  Pelvic: external bus vagina: normal with atrophic changes  Cervix: grossly normal  Uterus: grossly normal in size, difficult to palpate due to abdominal girth Adnexa: without masses or tenderness      Assessment/Plan:  70 year old female with postcoital bleeding, sonohysterogram showing an  endometrial polyp for hysteroscopy D&C removal of endometrial polyp. I've reviewed the proposed surgery with the patient to include the expected intraoperative postoperative courses, use of the resectoscope, D&C portion of the procedure. Acute risks of the procedure were reviewed to include infection, prolonged antibiotics, hemorrhage necessitating transfusion and the risks of transfusion to include transfusion reaction, hepatitis, HIV, mad cow disease and other unknown entities. The risk of uterine perforation either immediately recognized or delay recognized necessitating major exploratory reparative surgeries and future reparative surgeries including bowel resection, bladder repair, ureteral damage repair, ostomy formation was all discussed understood and accepted. Distended media absorption leading to metabolic complications such as coma and seizures was also discussed. She has a complex medical history to include pulmonary embolus.  I reviewed with her that I think the major risk of surgery would be more anesthetic cardiovascular risks then surgical.   I'm going to prophylactically treat with sequential compression stockings intraoperatively and postoperatively as well as Lovenox 40 mg daily for 2 weeks. I've reviewed the use of Lovenox with her and the importance of using it and she is comfortable with self injections having used insulin before. I am also prophylaxing with antibiotics given her knee replacement surgery but she understands the risk of knee infection and accepts this. The patient's questions were answered to her satisfaction she is ready to proceed with surgery.    Dara Lords MD, 11:54 AM 02/12/2011

## 2011-02-12 NOTE — H&P (Signed)
  [name] May 24, 1940 [id number]   History and Physical    Chief complaint: postmenopausal bleeding, endometrial polyp  History of present illness: 70 y.o.  Postmenopausal female initially presented complaining of vaginal discharge and post coital spotting. An ultrasound was ordered by Dr. Debby Bud which showed a small simple left ovarian cyst and an endometrial echo of 7.7 mm. Her Pap smear showed some inflammatory changes but was otherwise negative. She was referred for further evaluation.  A sonohysterogram was then performed which showed an endometrial echo of 8.8 mm with 2 small probable leiomyoma. Left ovary was visualized and normal right ovary was not visualized but no adnexal abnormalities seen. An endometrial polyp measuring 20 x 10 mm was identified an endometrial sample returned showing no endometrial tissue. Patient is admitted for hysteroscopy D&C removal of endometrial polyp. She has seen her cardiologist and was given medical clearance.   Past medical history,surgical history, medications, allergies, family history and social history were all reviewed and documented in the EPIC chart. ROS:  Was performed and pertinent positives and negatives are included in the history of present illness.  Exam:  General: well developed, well nourished female, no acute distress HEENT: normal  Lungs: clear to auscultation without wheezing, rales or rhonchi  Cardiac: regular rate without rubs, murmurs or gallops  Abdomen: soft, nontender without masses, guarding, rebound, organomegaly  Pelvic: external bus vagina: normal with atrophic changes  Cervix: grossly normal  Uterus: grossly normal in size, difficult to palpate due to abdominal girth Adnexa: without masses or tenderness      Assessment/Plan:  70 year old female with postcoital bleeding, sonohysterogram showing an endometrial polyp for hysteroscopy D&C removal of endometrial polyp. I've reviewed the proposed surgery with the  patient to include the expected intraoperative postoperative courses, use of the resectoscope, D&C portion of the procedure. Acute risks of the procedure were reviewed to include infection, prolonged antibiotics, hemorrhage necessitating transfusion and the risks of transfusion to include transfusion reaction, hepatitis, HIV, mad cow disease and other unknown entities. The risk of uterine perforation either immediately recognized or delay recognized necessitating major exploratory reparative surgeries and future reparative surgeries including bowel resection, bladder repair, ureteral damage repair, ostomy formation was all discussed understood and accepted. Distended media absorption leading to metabolic complications such as coma and seizures was also discussed. She has a complex medical history to include pulmonary embolus.  I reviewed with her that I think the major risk of surgery would be more anesthetic cardiovascular risks then surgical.   I'm going to prophylactically treat with sequential compression stockings intraoperatively and postoperatively as well as Lovenox 40 mg daily for 2 weeks. I've reviewed the use of Lovenox with her and the importance of using it and she is comfortable with self injections having used insulin before. I am also prophylaxing with antibiotics given her knee replacement surgery but she understands the risk of knee infection and accepts this. The patient's questions were answered to her satisfaction she is ready to proceed with surgery.    Dara Lords MD, 12:30 PM 02/12/2011

## 2011-02-12 NOTE — Progress Notes (Deleted)
[name] 16-Apr-1940 [id number]        70 y.o.  for annual exam.  ***  Past medical history,surgical history, medications, allergies, family history and social history were all reviewed and documented in the EPIC chart. ROS:  Was performed and pertinent positives and negatives are included in the history.  Exam: chaperone present There were no vitals filed for this visit. General appearance  Normal Skin grossly normal Head/Neck normal with no cervical or supraclavicular adenopathy thyroid normal Lungs  clear Cardiac RR, without RMG Abdominal  soft, nontender, without masses, organomegaly or hernia Breasts  examined lying and sitting without masses, retractions, discharge or axillary adenopathy. Pelvic  Ext/BUS/vagina  normal ***  Cervix  normal  ***  Uterus  ***, normal size, shape and contour, midline and mobile nontender   Adnexa  Without masses or tenderness    Anus and perineum  normal   Rectovaginal  normal sphincter tone without palpated masses or tenderness.    Assessment/Plan:  70 y.o. female for annual exam.   ***    Dara Lords MD, 8:46 AM 02/12/2011

## 2011-02-18 MED ORDER — ENOXAPARIN SODIUM 40 MG/0.4ML ~~LOC~~ SOLN
40.0000 mg | SUBCUTANEOUS | Status: AC
  Administered 2011-02-19: 40 mg via SUBCUTANEOUS
  Filled 2011-02-18: qty 0.4

## 2011-02-18 MED ORDER — CEFOTETAN DISODIUM 2 G IJ SOLR
2.0000 g | INTRAMUSCULAR | Status: AC
  Administered 2011-02-19: 2 g via INTRAVENOUS
  Filled 2011-02-18: qty 2

## 2011-02-19 ENCOUNTER — Ambulatory Visit (HOSPITAL_COMMUNITY): Payer: Medicare Other | Admitting: Anesthesiology

## 2011-02-19 ENCOUNTER — Encounter (HOSPITAL_COMMUNITY): Payer: Self-pay | Admitting: Anesthesiology

## 2011-02-19 ENCOUNTER — Encounter (HOSPITAL_COMMUNITY)
Admission: RE | Disposition: A | Discharge status: Home / Self Care | Payer: Self-pay | Source: Ambulatory Visit | Attending: Gynecology

## 2011-02-19 ENCOUNTER — Ambulatory Visit (HOSPITAL_COMMUNITY)
Admission: RE | Admit: 2011-02-19 | Discharge: 2011-02-19 | Disposition: A | Discharge status: Home / Self Care | Payer: Medicare Other | Source: Ambulatory Visit | Attending: Gynecology | Admitting: Gynecology

## 2011-02-19 ENCOUNTER — Encounter (HOSPITAL_COMMUNITY): Payer: Self-pay | Admitting: *Deleted

## 2011-02-19 ENCOUNTER — Other Ambulatory Visit: Payer: Self-pay | Admitting: Gynecology

## 2011-02-19 DIAGNOSIS — N950 Postmenopausal bleeding: Secondary | ICD-10-CM | POA: Insufficient documentation

## 2011-02-19 DIAGNOSIS — N840 Polyp of corpus uteri: Secondary | ICD-10-CM | POA: Insufficient documentation

## 2011-02-19 DIAGNOSIS — Z01818 Encounter for other preprocedural examination: Secondary | ICD-10-CM | POA: Insufficient documentation

## 2011-02-19 DIAGNOSIS — Z01812 Encounter for preprocedural laboratory examination: Secondary | ICD-10-CM | POA: Insufficient documentation

## 2011-02-19 DIAGNOSIS — Z9889 Other specified postprocedural states: Secondary | ICD-10-CM

## 2011-02-19 HISTORY — PX: DILATION AND CURETTAGE OF UTERUS: SHX78

## 2011-02-19 HISTORY — PX: HYSTEROSCOPY W/D&C: SHX1775

## 2011-02-19 LAB — GLUCOSE, CAPILLARY: Glucose-Capillary: 142 mg/dL — ABNORMAL HIGH (ref 70–99)

## 2011-02-19 SURGERY — DILATATION AND CURETTAGE /HYSTEROSCOPY
Anesthesia: General | Site: Uterus | Wound class: Clean Contaminated

## 2011-02-19 MED ORDER — LACTATED RINGERS IV SOLN
INTRAVENOUS | Status: DC
  Administered 2011-02-19 (×2): via INTRAVENOUS

## 2011-02-19 MED ORDER — METOCLOPRAMIDE HCL 5 MG/ML IJ SOLN: 10.0000 mg | Freq: Once | INTRAMUSCULAR | Status: DC | PRN

## 2011-02-19 MED ORDER — GLYCINE 1.5 % IR SOLN
Status: DC | PRN
  Administered 2011-02-19: 3000 mL

## 2011-02-19 MED ORDER — PROPOFOL 10 MG/ML IV EMUL
INTRAVENOUS | Status: DC | PRN
  Administered 2011-02-19: 200 mg via INTRAVENOUS
  Administered 2011-02-19: 50 mg via INTRAVENOUS

## 2011-02-19 MED ORDER — FENTANYL CITRATE 0.05 MG/ML IJ SOLN: 25.0000 ug | INTRAMUSCULAR | Status: DC | PRN

## 2011-02-19 MED ORDER — MEPERIDINE HCL 25 MG/ML IJ SOLN: 6.2500 mg | INTRAMUSCULAR | Status: DC | PRN

## 2011-02-19 MED ORDER — DEXAMETHASONE SODIUM PHOSPHATE 10 MG/ML IJ SOLN
INTRAMUSCULAR | Status: DC | PRN
  Administered 2011-02-19: 10 mg via INTRAVENOUS

## 2011-02-19 MED ORDER — MIDAZOLAM HCL 5 MG/5ML IJ SOLN
INTRAMUSCULAR | Status: DC | PRN
  Administered 2011-02-19: 2 mg via INTRAVENOUS

## 2011-02-19 MED ORDER — LIDOCAINE HCL (CARDIAC) 20 MG/ML IV SOLN
INTRAVENOUS | Status: AC
Start: 2011-02-19 — End: 2011-02-19
  Filled 2011-02-19: qty 5

## 2011-02-19 MED ORDER — FENTANYL CITRATE 0.05 MG/ML IJ SOLN
INTRAMUSCULAR | Status: AC
  Filled 2011-02-19: qty 2

## 2011-02-19 MED ORDER — ONDANSETRON HCL 4 MG/2ML IJ SOLN
INTRAMUSCULAR | Status: AC
  Filled 2011-02-19: qty 2

## 2011-02-19 MED ORDER — PROPOFOL 10 MG/ML IV EMUL
INTRAVENOUS | Status: AC
  Filled 2011-02-19: qty 20

## 2011-02-19 MED ORDER — FENTANYL CITRATE 0.05 MG/ML IJ SOLN
INTRAMUSCULAR | Status: DC | PRN
  Administered 2011-02-19 (×2): 50 ug via INTRAVENOUS

## 2011-02-19 MED ORDER — DEXTROSE-NACL 5-0.45 % IV SOLN: INTRAVENOUS | Status: DC

## 2011-02-19 MED ORDER — DEXAMETHASONE SODIUM PHOSPHATE 10 MG/ML IJ SOLN
INTRAMUSCULAR | Status: AC
  Filled 2011-02-19: qty 1

## 2011-02-19 MED ORDER — LIDOCAINE HCL (CARDIAC) 20 MG/ML IV SOLN
INTRAVENOUS | Status: DC | PRN
  Administered 2011-02-19: 100 mg via INTRAVENOUS

## 2011-02-19 MED ORDER — MIDAZOLAM HCL 2 MG/2ML IJ SOLN
INTRAMUSCULAR | Status: AC
  Filled 2011-02-19: qty 2

## 2011-02-19 MED ORDER — LIDOCAINE HCL 1 % IJ SOLN
INTRAMUSCULAR | Status: DC | PRN
  Administered 2011-02-19: 20 mL

## 2011-02-19 SURGICAL SUPPLY — 13 items
CANISTER SUCTION 2500CC (MISCELLANEOUS) ×2 IMPLANT
CATH ROBINSON RED A/P 16FR (CATHETERS) ×2 IMPLANT
CLOTH BEACON ORANGE TIMEOUT ST (SAFETY) ×2 IMPLANT
CONTAINER PREFILL 10% NBF 60ML (FORM) ×4 IMPLANT
ELECT REM PT RETURN 9FT ADLT (ELECTROSURGICAL)
ELECTRODE REM PT RTRN 9FT ADLT (ELECTROSURGICAL) IMPLANT
GLOVE BIO SURGEON STRL SZ7.5 (GLOVE) ×4 IMPLANT
GOWN PREVENTION PLUS LG XLONG (DISPOSABLE) ×2 IMPLANT
GOWN STRL REIN XL XLG (GOWN DISPOSABLE) ×2 IMPLANT
LOOP ANGLED CUTTING 22FR (CUTTING LOOP) ×2 IMPLANT
PACK HYSTEROSCOPY LF (CUSTOM PROCEDURE TRAY) ×2 IMPLANT
TOWEL OR 17X24 6PK STRL BLUE (TOWEL DISPOSABLE) ×4 IMPLANT
WATER STERILE IRR 1000ML POUR (IV SOLUTION) ×2 IMPLANT

## 2011-02-19 NOTE — Op Note (Addendum)
[name] 06-16-1940 [id number]   Post Operative Note   Date of surgery:  02/19/2011  Pre Op Dx: postmenopausal bleeding, endometrial polyp  Post Op Dx:  same  Procedure:  Hysteroscopy, D&C, resection of endometrial polyp  Surgeon:  Dara Lords  Anesthesia:  General  EBL:  Minimal  Distended media discrepancy: 50 cc  Complications:  None  Specimen:  #1 endometrial polyp #2 endometrial curetting to pathology   Findings: EUA:  External BUS vagina with atrophic genital changes. Cervix normal. Uterus grossly normal in size, midline, mobile. Adnexa without gross masses. Bimanual hindered by abdominal girth   Operative:  Hysteroscopy adequate noting fundus, anterior / posterior uterine surfaces, lower uterine segment, endocervical canal, right and left tubal ostial all visualized. Large endometrial polyp filling the cavity attached fundal posterior uterine cavity removed intact   Procedure:  Patient was taken to the operating room, underwent general anesthesia, was placed in the low dorsal lithotomy position, received a perineal and vaginal preparation with Betadine solution and an in and out Foley catheterization performed per nursing personnel. Patient was draped in the usual fashion and the cervix was visualized with a weighted speculum. The anterior lip was grasped with a single-tooth tenaculum and a paracervical block using 1% lidocaine was placed a total of 7 cc. The cervix was then gently and gradually dilated to admit the operative hysteroscope and hysteroscopy was performed with findings noted above. Using the right angle resectoscopic loop, the endometrial polyp was excised intact with transection of the stalk at the intersection with the surrounding endometrium. This was removed and sent to pathology. A sharp curettage was then performed and the specimen again was sent to pathology. Rehysteroscopy showed an empty cavity with good distention, adequate hemostasis and no  evidence of perforation. The instruments were removed hemostasis visualized at the cervical os and tenaculum site. The patient was placed in the supine position, awakened without difficulty and taken to recovery room in good condition having tolerated the procedure well.    Dara Lords MD, 8:27 AM 02/19/2011

## 2011-02-19 NOTE — H&P (Signed)
The patient was examined.  I reviewed the proposed surgery and consent form with the patient.  The dictated history and physical is current and accurate and all questions were answered. The patient is ready to proceed with surgery and has a realistic understanding and expectation for the outcome.   Dara Lords MD, 7:11 AM 02/19/2011

## 2011-02-19 NOTE — Anesthesia Preprocedure Evaluation (Signed)
Anesthesia Evaluation  Patient identified by MRN, date of birth, ID band Patient awake    Reviewed: Allergy & Precautions, H&P , NPO status , Patient's Chart, lab work & pertinent test results  History of Anesthesia Complications (+) POST - OP SPINAL HEADACHE  Airway Mallampati: I TM Distance: >3 FB Neck ROM: Full    Dental No notable dental hx. (+) Teeth Intact   Pulmonary shortness of breath and with exertion, asthma , sleep apnea ,  clear to auscultation  Pulmonary exam normal       Cardiovascular hypertension, Pt. on medications + dysrhythmias Atrial Fibrillation Irregular Normal+ Systolic murmurs    Neuro/Psych  Headaches, Anxiety  Neuromuscular disease    GI/Hepatic negative GI ROS, Neg liver ROS, PUD, GERD-  ,  Endo/Other  Negative Endocrine ROSDiabetes mellitus-, Well Controlled, Type 2, Oral Hypoglycemic Agents  Renal/GU negative Renal ROS  Genitourinary negative   Musculoskeletal  (+) Fibromyalgia -  Abdominal Normal abdominal exam  (+) obese,   Peds  Hematology negative hematology ROS (+)   Anesthesia Other Findings   Reproductive/Obstetrics negative OB ROS                           Anesthesia Physical Anesthesia Plan  ASA: III  Anesthesia Plan: General   Post-op Pain Management:    Induction: Intravenous  Airway Management Planned: LMA  Additional Equipment:   Intra-op Plan:   Post-operative Plan: Extubation in OR  Informed Consent: I have reviewed the patients History and Physical, chart, labs and discussed the procedure including the risks, benefits and alternatives for the proposed anesthesia with the patient or authorized representative who has indicated his/her understanding and acceptance.   Dental advisory given  Plan Discussed with: CRNA, Anesthesiologist and Surgeon  Anesthesia Plan Comments:         Anesthesia Quick Evaluation

## 2011-02-19 NOTE — Anesthesia Procedure Notes (Signed)
Procedure Name: LMA Insertion Date/Time: 02/19/2011 7:37 AM Performed by: Macky Galik MARIE Pre-anesthesia Checklist: Patient identified, Timeout performed, Emergency Drugs available, Suction available and Patient being monitored Patient Re-evaluated:Patient Re-evaluated prior to inductionOxygen Delivery Method: Circle System Utilized Preoxygenation: Pre-oxygenation with 100% oxygen Intubation Type: IV induction Ventilation: Mask ventilation without difficulty LMA Size: 5.0 Number of attempts: 1 Airway Equipment and Method: patient positioned with wedge pillow Tube secured with: taped at lips.

## 2011-02-19 NOTE — Transfer of Care (Signed)
Immediate Anesthesia Transfer of Care Note  Patient: [name]  Procedure(s) Performed:  DILATATION AND CURETTAGE /HYSTEROSCOPY - requests one hour  Patient Location: PACU  Anesthesia Type: General  Level of Consciousness: awake and alert   Airway & Oxygen Therapy: Patient Spontanous Breathing and Patient connected to nasal cannula oxygen  Post-op Assessment: Report given to PACU RN  Post vital signs: Reviewed and stable  Complications: No apparent anesthesia complications

## 2011-02-21 NOTE — Anesthesia Postprocedure Evaluation (Signed)
Anesthesia Post Note  Patient: [name]  Procedure(s) Performed:  DILATATION AND CURETTAGE /HYSTEROSCOPY - requests one hour  Anesthesia type: GA  Patient location: PACU  Post pain: Pain level controlled  Post assessment: Post-op Vital signs reviewed  Last Vitals:  Filed Vitals:   02/19/11 0944  BP:   Pulse:   Temp: 36.7 C  Resp:     Post vital signs: Reviewed  Level of consciousness: sedated  Complications: No apparent anesthesia complications

## 2011-02-22 ENCOUNTER — Encounter (HOSPITAL_COMMUNITY): Payer: Self-pay | Admitting: Gynecology

## 2011-02-22 LAB — GLUCOSE, CAPILLARY: Glucose-Capillary: 154 mg/dL — ABNORMAL HIGH (ref 70–99)

## 2011-03-01 ENCOUNTER — Telehealth: Payer: Self-pay | Admitting: Internal Medicine

## 2011-03-01 ENCOUNTER — Ambulatory Visit (INDEPENDENT_AMBULATORY_CARE_PROVIDER_SITE_OTHER): Payer: Medicare Other | Admitting: Internal Medicine

## 2011-03-01 VITALS — BP 144/86 | HR 72 | Temp 99.2°F | Wt 251.0 lb

## 2011-03-01 DIAGNOSIS — J069 Acute upper respiratory infection, unspecified: Secondary | ICD-10-CM | POA: Diagnosis not present

## 2011-03-01 MED ORDER — AZITHROMYCIN 250 MG PO TABS: ORAL_TABLET | ORAL | Status: AC

## 2011-03-01 MED ORDER — BENZONATATE 100 MG PO CAPS: 100.0000 mg | ORAL_CAPSULE | Freq: Two times a day (BID) | ORAL | Status: DC | PRN

## 2011-03-01 MED ORDER — PROMETHAZINE-CODEINE 6.25-10 MG/5ML PO SYRP: 5.0000 mL | ORAL_SOLUTION | Freq: Four times a day (QID) | ORAL | Status: DC | PRN

## 2011-03-01 NOTE — Telephone Encounter (Signed)
Ok for today at [employer]

## 2011-03-01 NOTE — Telephone Encounter (Signed)
The pt called and is requesting a same day appt for congestion.  Please advise when you can see this patient.    Thanks!

## 2011-03-01 NOTE — Patient Instructions (Signed)
Upper respiratory infection: plan - Z-pak as directed, refill on promethazine/codiene cough syrup, hydrate, tylenol as needed. Call for unresolved fever, increased SOB.   Upper Respiratory Infection, Adult An upper respiratory infection (URI) is also sometimes known as the common cold. The upper respiratory tract includes the nose, sinuses, throat, trachea, and bronchi. Bronchi are the airways leading to the lungs. Most people improve within 1 week, but symptoms can last up to 2 weeks. A residual cough may last even longer.   CAUSES Many different viruses can infect the tissues lining the upper respiratory tract. The tissues become irritated and inflamed and often become very moist. Mucus production is also common. A cold is contagious. You can easily spread the virus to others by oral contact. This includes kissing, sharing a glass, coughing, or sneezing. Touching your mouth or nose and then touching a surface, which is then touched by another person, can also spread the virus. SYMPTOMS   Symptoms typically develop 1 to 3 days after you come in contact with a cold virus. Symptoms vary from person to person. They may include:  Runny nose.     Sneezing.    Nasal congestion.     Sinus irritation.     Sore throat.     Loss of voice (laryngitis).     Cough.    Fatigue.    Muscle aches.     Loss of appetite.     Headache.    Low-grade fever.  DIAGNOSIS   You might diagnose your own cold based on familiar symptoms, since most people get a cold 2 to 3 times a year. Your caregiver can confirm this based on your exam. Most importantly, your caregiver can check that your symptoms are not due to another disease such as strep throat, sinusitis, pneumonia, asthma, or epiglottitis. Blood tests, throat tests, and X-rays are not necessary to diagnose a common cold, but they may sometimes be helpful in excluding other more serious diseases. Your caregiver will decide if any further tests are  required. RISKS AND COMPLICATIONS   You may be at risk for a more severe case of the common cold if you smoke cigarettes, have chronic heart disease (such as heart failure) or lung disease (such as asthma), or if you have a weakened immune system. The very young and very old are also at risk for more serious infections. Bacterial sinusitis, middle ear infections, and bacterial pneumonia can complicate the common cold. The common cold can worsen asthma and chronic obstructive pulmonary disease (COPD). Sometimes, these complications can require emergency medical care and may be life-threatening. PREVENTION   The best way to protect against getting a cold is to practice good hygiene. Avoid oral or hand contact with people with cold symptoms. Wash your hands often if contact occurs. There is no clear evidence that vitamin C, vitamin E, echinacea, or exercise reduces the chance of developing a cold. However, it is always recommended to get plenty of rest and practice good nutrition. TREATMENT   Treatment is directed at relieving symptoms. There is no cure. Antibiotics are not effective, because the infection is caused by a virus, not by bacteria. Treatment may include:  Increased fluid intake. Sports drinks offer valuable electrolytes, sugars, and fluids.     Breathing heated mist or steam (vaporizer or shower).     Eating chicken soup or other clear broths, and maintaining good nutrition.     Getting plenty of rest.     Using gargles or lozenges for comfort.  Controlling fevers with ibuprofen or acetaminophen as directed by your caregiver.     Increasing usage of your inhaler if you have asthma.  Zinc gel and zinc lozenges, taken in the first 24 hours of the common cold, can shorten the duration and lessen the severity of symptoms. Pain medicines may help with fever, muscle aches, and throat pain. A variety of non-prescription medicines are available to treat congestion and runny nose. Your  caregiver can make recommendations and may suggest nasal or lung inhalers for other symptoms.   HOME CARE INSTRUCTIONS    Only take over-the-counter or prescription medicines for pain, discomfort, or fever as directed by your caregiver.     Use a warm mist humidifier or inhale steam from a shower to increase air moisture. This may keep secretions moist and make it easier to breathe.     Drink enough water and fluids to keep your urine clear or pale yellow.     Rest as needed.     Return to work when your temperature has returned to normal or as your caregiver advises. You may need to stay home longer to avoid infecting others. You can also use a face mask and careful hand washing to prevent spread of the virus.  SEEK MEDICAL CARE IF:    After the first few days, you feel you are getting worse rather than better.     You need your caregiver's advice about medicines to control symptoms.     You develop chills, worsening shortness of breath, or brown or red sputum. These may be signs of pneumonia.     You develop yellow or brown nasal discharge or pain in the face, especially when you bend forward. These may be signs of sinusitis.     You develop a fever, swollen neck glands, pain with swallowing, or white areas in the back of your throat. These may be signs of strep throat.  SEEK IMMEDIATE MEDICAL CARE IF:    You have a fever.     You develop severe or persistent headache, ear pain, sinus pain, or chest pain.     You develop wheezing, a prolonged cough, cough up blood, or have a change in your usual mucus (if you have chronic lung disease).     You develop sore muscles or a stiff neck.  Document Released: 08/04/2000 Document Revised: 10/21/2010 Document Reviewed: 06/12/2010 [facility] Patient Information 2012 [place], [place].

## 2011-03-01 NOTE — Progress Notes (Signed)
  Subjective:    Patient ID: [name], female    DOB: Oct 24, 1940, 71 y.o.   MRN: [id number]  HPI [name] developed URI symptoms last Friday, Jan 4th with congestion, increased wheezing, hoarseness, cough - can't get the sputum out. NO fever documented fever but she has felt feverish.   Interval hx: Nov 20th, '12 C5-6,6-7 anterior cervical discectomy with fusion and allograft and plating ([name])                   Dec 28th, '12 - D&C for uterine polyp. ([name])   Review of Systems System review is negative for any constitutional, cardiac, pulmonary, GI or neuro symptoms or complaints other than as described in the HPI.     Objective:   Physical Exam Filed Vitals:   03/01/11 1609  BP: 144/86  Pulse: 72  Temp: 99.2 F (37.3 C)  HEENT- no significant sinus tenderness Chest - good BS w/o rales or wheezes Cor - RRR Abd- obese Neuro - A&O x 3, MAE, normal gait.       Assessment & Plan:  URI  Plan - azithromycin           Prom/cod           Supportive care

## 2011-03-04 ENCOUNTER — Telehealth: Payer: Self-pay | Admitting: *Deleted

## 2011-03-04 ENCOUNTER — Ambulatory Visit (INDEPENDENT_AMBULATORY_CARE_PROVIDER_SITE_OTHER): Payer: Medicare Other | Admitting: Internal Medicine

## 2011-03-04 VITALS — BP 132/88 | HR 77 | Temp 97.8°F | Wt 252.0 lb

## 2011-03-04 DIAGNOSIS — J209 Acute bronchitis, unspecified: Secondary | ICD-10-CM | POA: Diagnosis not present

## 2011-03-04 MED ORDER — MOXIFLOXACIN HCL 400 MG PO TABS: 400.0000 mg | ORAL_TABLET | Freq: Every day | ORAL | Status: AC

## 2011-03-04 MED ORDER — PREDNISONE 10 MG PO TABS: ORAL_TABLET | ORAL | Status: DC

## 2011-03-04 NOTE — Patient Instructions (Signed)
Worsening acute bronchitis - plan finish z-pak; take avelox 400 mg once a day for 7 days; prednisone as directed - 4 tabs/day x 3, 3 tabs/day x 3, 2 tabs/day x 3, 1 tab/day x6; continue cough syrup; continue inhaler BUT if you need to use inhaler more thatn 4 times a day you must be seen or if the office is closed go to ED.

## 2011-03-04 NOTE — Progress Notes (Signed)
  Subjective:    Patient ID: [name], female    DOB: 1940-04-19, 71 y.o.   MRN: [id number]  HPI Patient seen Monday, Jan 7th for URI and was treated with z-pak and antitussive. She returns with increase respiratory symptoms: SOB, wheezing and continued fever. She has been using her albuterol 2-3 times a day. IN the office she does have some increased WOB but O2 sat was 96%.  I have reviewed the patient's medical history in detail and updated the computerized patient record.    Review of Systems System review is negative for any constitutional, cardiac, pulmonary, GI or neuro symptoms or complaints other than as described in the HPI.     Objective:   Physical Exam Filed Vitals:   03/04/11 1102  BP: 132/88  Pulse: 77  Temp: 97.8 F (36.6 C)  Obese white woman with mild increased work of breathing - even after HHN Chest- coarse rhonchi, expiratory wheezing, no neck retractions Cor- RRR       Assessment & Plan:  Bronchitis - poor response to z-pak with continued SOB and wheezing.  Plan - outpatient treatment             Levaquin 500 mg once a day x 7            Prednisone 40 mg qd x 3, 30 mg qd x 3, 20 mg qd x 3, 10 mg qd x 6          Call if using inhaler more than 4 times a day.

## 2011-03-04 NOTE — Telephone Encounter (Signed)
moxifloxacin (Avalox) 400mg  was sent into pharmacy & Pt's insurance does not cover. Is there an alternative medication? Please advise.

## 2011-03-05 ENCOUNTER — Ambulatory Visit: Payer: Medicare Other | Admitting: Gynecology

## 2011-03-05 NOTE — Telephone Encounter (Signed)
Resolved 03/04/11 - pharmacy called and levaquin 500 mg qd was substituted.

## 2011-03-09 ENCOUNTER — Ambulatory Visit (INDEPENDENT_AMBULATORY_CARE_PROVIDER_SITE_OTHER): Payer: Medicare Other | Admitting: Gynecology

## 2011-03-09 ENCOUNTER — Encounter: Payer: Self-pay | Admitting: Gynecology

## 2011-03-09 ENCOUNTER — Other Ambulatory Visit: Payer: Self-pay | Admitting: Internal Medicine

## 2011-03-09 DIAGNOSIS — J209 Acute bronchitis, unspecified: Secondary | ICD-10-CM | POA: Insufficient documentation

## 2011-03-09 DIAGNOSIS — N840 Polyp of corpus uteri: Secondary | ICD-10-CM

## 2011-03-09 DIAGNOSIS — Z9889 Other specified postprocedural states: Secondary | ICD-10-CM

## 2011-03-09 DIAGNOSIS — N950 Postmenopausal bleeding: Secondary | ICD-10-CM

## 2011-03-09 NOTE — Patient Instructions (Signed)
Report any further vaginal bleeding. 

## 2011-03-09 NOTE — Progress Notes (Signed)
Patient presents for her postop check status post hysteroscopy, D&C, resection of endometrial polyp. She's done well postoperatively without complaints or bleeding. Pathology report: [id number] REPORT OF SURGICAL PATHOLOGY FINAL DIAGNOSIS Diagnosis 1. Endometrial polyp - BENIGN ENDOCERVICAL TYPE POLYP. - THERE IS NO EVIDENCE OF MALIGNANCY. - SEE COMMENT. 2. Endometrium, curettage - ATROPHIC APPEARING ENDOMETRIUM. - THERE IS NO EVIDENCE OF HYPERPLASIA OR MALIGNANCY.  Exam was Sherri chaperone present External BUS vagina with atrophic genital changes. Cervix normal. Uterus grossly normal in size midline and mobile. Adnexa without masses or tenderness. Exam hindered by abdominal girth.  Assessment and plan: History of postmenopausal bleeding with ultimate removal of benign endometrial polyp. Patient will keep bleeding calendar as long she does no further bleeding then we'll follow expectantly. She received her normal gynecologic exams through Dr. Debby Bud office and will continue to do so and I remain available if there are any issues in the future.

## 2011-03-10 ENCOUNTER — Ambulatory Visit
Admission: RE | Admit: 2011-03-10 | Discharge: 2011-03-10 | Disposition: A | Discharge status: Home / Self Care | Payer: Medicare Other | Source: Ambulatory Visit | Attending: Neurosurgery | Admitting: Neurosurgery

## 2011-03-10 ENCOUNTER — Other Ambulatory Visit: Payer: Self-pay | Admitting: Neurosurgery

## 2011-03-10 DIAGNOSIS — M4802 Spinal stenosis, cervical region: Secondary | ICD-10-CM

## 2011-03-10 DIAGNOSIS — M542 Cervicalgia: Secondary | ICD-10-CM | POA: Diagnosis not present

## 2011-03-30 ENCOUNTER — Encounter: Payer: Self-pay | Admitting: Gastroenterology

## 2011-03-31 ENCOUNTER — Other Ambulatory Visit: Payer: Self-pay

## 2011-03-31 ENCOUNTER — Encounter (HOSPITAL_COMMUNITY): Payer: Self-pay | Admitting: *Deleted

## 2011-03-31 ENCOUNTER — Emergency Department (HOSPITAL_COMMUNITY)
Admission: EM | Admit: 2011-03-31 | Discharge: 2011-03-31 | Disposition: A | Discharge status: Home / Self Care | Payer: Medicare Other | Attending: Emergency Medicine | Admitting: Emergency Medicine

## 2011-03-31 DIAGNOSIS — I10 Essential (primary) hypertension: Secondary | ICD-10-CM | POA: Insufficient documentation

## 2011-03-31 DIAGNOSIS — K219 Gastro-esophageal reflux disease without esophagitis: Secondary | ICD-10-CM | POA: Insufficient documentation

## 2011-03-31 DIAGNOSIS — I4891 Unspecified atrial fibrillation: Secondary | ICD-10-CM | POA: Insufficient documentation

## 2011-03-31 DIAGNOSIS — R079 Chest pain, unspecified: Secondary | ICD-10-CM | POA: Diagnosis not present

## 2011-03-31 DIAGNOSIS — G4730 Sleep apnea, unspecified: Secondary | ICD-10-CM | POA: Diagnosis not present

## 2011-03-31 DIAGNOSIS — R0602 Shortness of breath: Secondary | ICD-10-CM | POA: Insufficient documentation

## 2011-03-31 DIAGNOSIS — R002 Palpitations: Secondary | ICD-10-CM | POA: Diagnosis not present

## 2011-03-31 DIAGNOSIS — R5381 Other malaise: Secondary | ICD-10-CM | POA: Insufficient documentation

## 2011-03-31 DIAGNOSIS — J45909 Unspecified asthma, uncomplicated: Secondary | ICD-10-CM | POA: Diagnosis not present

## 2011-03-31 DIAGNOSIS — IMO0001 Reserved for inherently not codable concepts without codable children: Secondary | ICD-10-CM | POA: Insufficient documentation

## 2011-03-31 LAB — URINALYSIS, ROUTINE W REFLEX MICROSCOPIC
Glucose, UA: 250 mg/dL — AB
Hgb urine dipstick: NEGATIVE
Ketones, ur: NEGATIVE mg/dL
Protein, ur: NEGATIVE mg/dL
Urobilinogen, UA: 0.2 mg/dL (ref 0.0–1.0)

## 2011-03-31 LAB — BASIC METABOLIC PANEL
BUN: 15 mg/dL (ref 6–23)
Calcium: 9.3 mg/dL (ref 8.4–10.5)
GFR calc Af Amer: >90 mL/min (ref >90)
GFR calc non Af Amer: 85 mL/min — ABNORMAL LOW (ref >90)
Glucose, Bld: 181 mg/dL — ABNORMAL HIGH (ref 70–99)
Sodium: 141 mEq/L (ref 135–145)

## 2011-03-31 LAB — CBC
MCH: 29.1 pg (ref 26.0–34.0)
MCHC: 34 g/dL (ref 30.0–36.0)
MCV: 85.7 fL (ref 78.0–100.0)
Platelets: 184 10*3/uL (ref 150–400)
RBC: 4.88 MIL/uL (ref 3.87–5.11)

## 2011-03-31 LAB — URINE MICROSCOPIC-ADD ON

## 2011-03-31 LAB — DIFFERENTIAL
Basophils Relative: 0 % (ref 0–1)
Eosinophils Absolute: 0.4 10*3/uL (ref 0.0–0.7)
Eosinophils Relative: 4 % (ref 0–5)
Lymphs Abs: 3 10*3/uL (ref 0.7–4.0)
Neutrophils Relative %: 53 % (ref 43–77)

## 2011-03-31 MED ORDER — DILTIAZEM HCL 100 MG IV SOLR: 5.0000 mg/h | INTRAVENOUS | Status: DC

## 2011-03-31 MED ORDER — DILTIAZEM HCL 50 MG/10ML IV SOLN: 10.0000 mg | Freq: Once | INTRAVENOUS | Status: DC

## 2011-03-31 MED ORDER — METOPROLOL TARTRATE 1 MG/ML IV SOLN
INTRAVENOUS | Status: AC
  Administered 2011-03-31: 14:00:00
  Filled 2011-03-31: qty 5

## 2011-03-31 NOTE — ED Notes (Signed)
MD at bedside. 

## 2011-03-31 NOTE — ED Notes (Signed)
Per EMS- pt was at the gas station when she began feeling SOB and choking sensation. Pt has hx of A. Fib. Pt  bp was 184/118 initially, hr 160, cbg 153. Pt received asprin, 1 sublingual nitro, and 5 mg of metoprolol.

## 2011-03-31 NOTE — ED Provider Notes (Cosign Needed)
History     CSN: [id number]  Arrival date & time 03/31/11  0901   First MD Initiated Contact with Patient 03/31/11 [phone]      Chief Complaint  Patient presents with  . Chest Pain    (Consider location/radiation/quality/duration/timing/severity/associated sxs/prior treatment) HPI [name] is a 71 y.o. female presents with sensation of rapid heart and associated with palpitations and chest tightness. These started after she had gone out to get pancakes at [facility]. She drove home, and felt weak and fatigued as she walked into the house. She called an ambulance and came to evaluate her and transported her here. During the transport. She was treated with Lopressor IV 5 mg with decrease of her heart rate from 150 to the 120s. I also gave her nitroglycerin sublingual. On arrival in the emergency department. She has mild chest tightness, heart rate in the 130s, and normal blood pressure. She states that usually she is in sinus rhythm, but has had periods of intermittent atrial fibrillation. She denies any recent illnesses including nausea, vomiting, urinary tract problems, diarrhea, or fever. She has chronic back pain and had to take a pain pill for it this morning. She took her usual morning medications today        Past Medical History  Diagnosis Date  . Allergic rhinitis   . Asthma   . Gout   . Hypertension   . Peptic ulcer disease   . History of PSVT (paroxysmal supraventricular tachycardia)   . Colon cancer 2007    s/p surgery and chemo  . Hx of colonoscopy   . HLD (hyperlipidemia)   . DJD (degenerative joint disease)   . History of blood clots     right lung in early 90's  . Atrial fibrillation     pt hasn't been on Coumadin;cardiologist is  Dr.Stuckey  . Sleep apnea     doesn't use CPAP  . Shortness of breath     pt states that she can be sitting as well as exertion and get short of breath  . Bronchitis     last time a year ago  . History of migraine headaches     last migraine 21yrs ago;Pt states she does get sinus headaches  . Fibromyalgia   . Osteoarthritis   . Chronic back pain     hx buldging disc  . Rash     on neck and started after she started taking her beta blocker 43yrs ago;medical MD has seen this  . GERD (gastroesophageal reflux disease)     uses Rolaids prn  . Gastric ulcer   . Hemorrhoids   . Family hx of colon cancer   . Colon cancer   . Diabetes mellitus     glimepiride  . Anxiety     and panic attacks;pt states that she is claustrophobic  . Yeast infection of the vagina     being treated for this now by dr.fontaine  . Spinal headache     after 2nd child  . Bronchitis, acute 03-01-11    FINSHED Z-PAK-STILL ON PREDNISONE NOW    Past Surgical History  Procedure Date  . Caesarean section 1966/69/72    x 3  . Heel spur surgery 1992    x 2  . Ankle surgery     tumor-benign removed left ankle  . Lumbar disc surgery 2008  . Colectomy 2007    colon cancer  . Pyelonidal cystectomy     at age 27  . Knee  arthrosocpy     bil;couple of years before knee replacement  . Ganglion cyst excision > 62yrs ago    removed from left pointer finger  . Cataract surgery 2010  . Tubal ligation 1972  . Cardiac catheterization 90's and 2005  . Port a cath placed 2008  . Port a cath removed 2008  . Knee surgery     left TKA  . Anterior cervical decomp/discectomy fusion 01/12/2011    Procedure: ANTERIOR CERVICAL DECOMPRESSION/DISCECTOMY FUSION 2 LEVELS;  Surgeon: Kathaleen Maser Pool;  Location: [facility];  Service: Neurosurgery;  Laterality: Bilateral;  Cervical five-six, six-seven anterior cervical discectomy and fusion with allograft and plating  . Hysteroscopy w/d&c 02/19/2011    Procedure: DILATATION AND CURETTAGE /HYSTEROSCOPY;  Surgeon: Dara Lords, MD;  Location: [facility];  Service: Gynecology;  Laterality: N/A;  requests one hour  . Dilation and curettage of uterus 02-19-11    & POLYP REMOVAL    Family History  Problem  Relation Age of Onset  . Coronary artery disease Other   . Heart attack Mother   . Stroke Mother   . Hypertension Mother   . Heart disease Father   . Hypertension Sister   . Cancer Sister     left kidney cancer  . Cancer Brother     bladder  . Cancer Brother     liver, spread to colon  . Anesthesia problems Neg Hx   . Hypotension Neg Hx   . Malignant hyperthermia Neg Hx   . Pseudochol deficiency Neg Hx     History  Substance Use Topics  . Smoking status: Former Smoker -- 1.5 packs/day for 30 years    Types: Cigarettes  . Smokeless tobacco: Never Used   Comment: quit 19 years ago  . Alcohol Use: No    OB History    Grav Para Term Preterm Abortions TAB SAB Ect Mult Living   3 3        3       Review of Systems  All other systems reviewed and are negative.    Allergies  Morphine; Nifedipine; and Piroxicam  Home Medications   Current Outpatient Rx  Name Route Sig Dispense Refill  . ALBUTEROL SULFATE HFA 108 (90 BASE) MCG/ACT IN AERS Inhalation Inhale 2 puffs into the lungs every 6 (six) hours as needed. Shortness of breath    . ASPIRIN 81 MG PO TABS Oral Take 324 mg by mouth once.    [name] BENAZEPRIL HCL 20 MG PO TABS Oral Take 1 tablet (20 mg total) by mouth 2 (two) times daily. 60 tablet 11  . COLCHICINE 0.6 MG PO TABS Oral Take 0.6 mg by mouth daily as needed. gout    . DILTIAZEM HCL ER COATED BEADS 240 MG PO CP24 Oral Take 240 mg by mouth daily.      . FUROSEMIDE 40 MG PO TABS Oral Take 40 mg by mouth every other day.     [name] GLIMEPIRIDE 4 MG PO TABS Oral Take 4 mg by mouth daily before breakfast.    . LOPERAMIDE HCL 2 MG PO CAPS Oral Take 2 mg by mouth 4 (four) times daily as needed. Loose stools    . OXYCODONE-ACETAMINOPHEN 5-325 MG PO TABS Oral Take 1 tablet by mouth every 4 (four) hours as needed. pain     . PROMETHAZINE HCL 25 MG PO TABS Oral Take 25 mg by mouth every 6 (six) hours as needed. Only takes for severe nausea    .  TRAMADOL HCL 50 MG PO TABS Oral Take  50 mg by mouth every 6 (six) hours as needed. For pain    . BAYER CONTOUR TEST VI STRP  USE AS DIRECTED 50 strip 2    REFILLS PLEASE  . MICROLET LANCETS MISC Does not apply by Does not apply route.       BP 121/73  Pulse 74  Temp(Src) 98.4 F (36.9 C) (Oral)  Resp 18  SpO2 95%  LMP 12/30/2000  Physical Exam  Nursing note and vitals reviewed. Constitutional: She is oriented to person, place, and time. She appears well-developed and well-nourished.       She is obese.  HENT:  Head: Normocephalic and atraumatic.  Eyes: Conjunctivae and EOM are normal. Pupils are equal, round, and reactive to light.  Neck: Normal range of motion and phonation normal. Neck supple.  Cardiovascular: Intact distal pulses.  Exam reveals no friction rub.   No murmur heard.      Irregularly irregular, tachycardic rhythm  Pulmonary/Chest: Effort normal and breath sounds normal. She exhibits no tenderness.  Abdominal: Soft. She exhibits no distension. There is no tenderness. There is no guarding.  Musculoskeletal: Normal range of motion. She exhibits no edema and no tenderness.  Neurological: She is alert and oriented to person, place, and time. She has normal strength. She exhibits normal muscle tone.  Skin: Skin is warm and dry.  Psychiatric: She has a normal mood and affect. Her behavior is normal. Judgment and thought content normal.    ED Course  Procedures (including critical care time)  Date: 03/31/2011- 09:06  Rate: 136  Rhythm: atrial fibrillation  QRS Axis: normal  Intervals: Normal QT  ST/T Wave abnormalities: normal  Conduction Disutrbances:none  Narrative Interpretation:   Old EKG Reviewed: changes noted   Date: 03/31/2011- 10:15  Rate: 77  Rhythm: normal sinus rhythm  QRS Axis: normal  Intervals: normal  ST/T Wave abnormalities: normal  Conduction Disutrbances:none  Narrative Interpretation: resolved AF with RVR as cf earlier  Old EKG Reviewed: changes noted  1:36 PM  Reevaluation with update and discussion. After initial assessment and treatment, an updated evaluation reveals she feels well. HR normalized, and maintained. Ambulation trial done. Makaria Poarch L    Labs Reviewed  BASIC METABOLIC PANEL - Abnormal; Notable for the following:    Glucose, Bld 181 (*)    GFR calc non Af Amer 85 (*)    All other components within normal limits  URINALYSIS, ROUTINE W REFLEX MICROSCOPIC - Abnormal; Notable for the following:    APPearance CLOUDY (*)    Glucose, UA 250 (*)    Leukocytes, UA LARGE (*)    All other components within normal limits  URINE MICROSCOPIC-ADD ON - Abnormal; Notable for the following:    Squamous Epithelial / LPF MANY (*)    All other components within normal limits  PRO B NATRIURETIC PEPTIDE  CBC  DIFFERENTIAL  TROPONIN I  URINE CULTURE   No results found.   1. Atrial fibrillation with rapid ventricular response       MDM  Palpitations due to recurrent atrial fibrillation. A. fib resolved spontaneously in emergency department. Screening evaluation for MI is negative. There is no metabolic instability or suspected infection. Urinalysis is abnormal, however, it is not a clean catch, and has abscess or contamination. Urine culture has been performed. Patient is stable for discharge with her current medication plan.        Flint Melter, MD 03/31/11 1326  Mechele Collin L  Effie Shy, MD 03/31/11 1336

## 2011-03-31 NOTE — ED Notes (Signed)
Pt has c/o HA. Rates 2 on pain scale.

## 2011-03-31 NOTE — ED Notes (Signed)
New EKG shown to EDP patient in Sin[facility].

## 2011-03-31 NOTE — ED Notes (Signed)
Pt has SOB and choking sensation this morning. Pt has irregular HR and a. Fib. Pt denies any pain other that headache since receiving nitro. Pt states that the headache is subsiding. Pt is on 2L O2. Pt states that she has had these episodes in the past but they are becoming more frequent.

## 2011-03-31 NOTE — ED Notes (Signed)
Patient ambulated in ED without incident. Ax4

## 2011-04-01 LAB — URINE CULTURE: Colony Count: 4000

## 2011-04-02 ENCOUNTER — Other Ambulatory Visit: Payer: Self-pay | Admitting: Gynecology

## 2011-04-05 ENCOUNTER — Ambulatory Visit (INDEPENDENT_AMBULATORY_CARE_PROVIDER_SITE_OTHER): Payer: Medicare Other | Admitting: Physician Assistant

## 2011-04-05 ENCOUNTER — Encounter: Payer: Self-pay | Admitting: Physician Assistant

## 2011-04-05 DIAGNOSIS — I4891 Unspecified atrial fibrillation: Secondary | ICD-10-CM | POA: Diagnosis not present

## 2011-04-05 DIAGNOSIS — Z7901 Long term (current) use of anticoagulants: Secondary | ICD-10-CM | POA: Insufficient documentation

## 2011-04-05 DIAGNOSIS — I10 Essential (primary) hypertension: Secondary | ICD-10-CM | POA: Diagnosis not present

## 2011-04-05 MED ORDER — WARFARIN SODIUM 5 MG PO TABS: 5.0000 mg | ORAL_TABLET | Freq: Every day | ORAL | Status: DC

## 2011-04-05 MED ORDER — DIGOXIN 125 MCG PO TABS: 125.0000 ug | ORAL_TABLET | Freq: Every day | ORAL | Status: DC

## 2011-04-05 NOTE — Progress Notes (Signed)
HPI:  This is a 71 year old white female patient of Dr. Nancy Marus who comes in today because of recurrent atrial fibrillation. She has a history of paroxysmal atrial fibrillation and last saw Dr. Riley Kill in December 2012 for preop evaluation. He has recommended Coumadin because of her chest score and increased risk of CVA. She has refused this in the past because of some bleeding issues but now that she's had her D&C she is willing to go ahead with the Coumadin. She does have 2 tumors in her wrist that will need removed but she has not seen the orthopedist for this yet. She was trying to hold off on Coumadin until she had the surgery performed.  The patient had an episode of recurrent atrial fibrillation on 03/31/11 when she was driving to the catheterization. She felt her heart racing and called 911. Her crit was about 150 beats per minute and she was given IV Lopressor and regular hospital. Her heart rate was still in the 130s and it took a while for it to come down. She takes Cardizem 240 mg daily at home. She has not tolerated higher doses of this in the past and doesn't take beta blocker because of wheezing.  The patient states she feels like she is going in and out of atrial fibrillation more frequently. They're quick short acting episodes that resolved spontaneously.   Allergies  Allergen Reactions  . Morphine     REACTION: Reaction not  Makes you feel bad all over  . Nifedipine     REACTION: hives and itching  . Piroxicam     REACTION: itching and hives    Current Outpatient Prescriptions on File Prior to Visit  Medication Sig Dispense Refill  . albuterol (PROVENTIL HFA;VENTOLIN HFA) 108 (90 BASE) MCG/ACT inhaler Inhale 2 puffs into the lungs every 6 (six) hours as needed. Shortness of breath      . benazepril (LOTENSIN) 20 MG tablet Take 1 tablet (20 mg total) by mouth 2 (two) times daily.  60 tablet  11  . colchicine 0.6 MG tablet Take 0.6 mg by mouth daily as needed. gout      .  diltiazem (CARDIZEM CD) 240 MG 24 hr capsule Take 240 mg by mouth daily.        . furosemide (LASIX) 40 MG tablet Take 40 mg by mouth every other day.       [name] glimepiride (AMARYL) 4 MG tablet Take 4 mg by mouth daily before breakfast.      . loperamide (IMODIUM) 2 MG capsule Take 2 mg by mouth 4 (four) times daily as needed. Loose stools      . MICROLET LANCETS MISC by Does not apply route.       [name] oxyCODONE-acetaminophen (PERCOCET) 5-325 MG per tablet Take 1 tablet by mouth every 4 (four) hours as needed. pain       . traMADol (ULTRAM) 50 MG tablet Take 50 mg by mouth every 6 (six) hours as needed. For pain      . aspirin 81 MG tablet Take 324 mg by mouth once.      [name] BAYER CONTOUR TEST test strip USE AS DIRECTED  50 strip  2  . promethazine (PHENERGAN) 25 MG tablet Take 25 mg by mouth every 6 (six) hours as needed. Only takes for severe nausea      . terconazole (TERAZOL 7) 0.4 % vaginal cream INSERT 1 APPLICATORFUL VAGINALLY AT BEDTIME FOR 7 NIGHTS  45 g  0  Past Medical History  Diagnosis Date  . Allergic rhinitis   . Asthma   . Gout   . Hypertension   . Peptic ulcer disease   . History of PSVT (paroxysmal supraventricular tachycardia)   . Colon cancer 2007    s/p surgery and chemo  . Hx of colonoscopy   . HLD (hyperlipidemia)   . DJD (degenerative joint disease)   . History of blood clots     right lung in early 90's  . Atrial fibrillation     pt hasn't been on Coumadin;cardiologist is  Dr.Stuckey  . Sleep apnea     doesn't use CPAP  . Shortness of breath     pt states that she can be sitting as well as exertion and get short of breath  . Bronchitis     last time a year ago  . History of migraine headaches     last migraine 59yrs ago;Pt states she does get sinus headaches  . Fibromyalgia   . Osteoarthritis   . Chronic back pain     hx buldging disc  . Rash     on neck and started after she started taking her beta blocker 55yrs ago;medical MD has seen this  . GERD  (gastroesophageal reflux disease)     uses Rolaids prn  . Gastric ulcer   . Hemorrhoids   . Family hx of colon cancer   . Colon cancer   . Diabetes mellitus     glimepiride  . Anxiety     and panic attacks;pt states that she is claustrophobic  . Yeast infection of the vagina     being treated for this now by dr.fontaine  . Spinal headache     after 2nd child  . Bronchitis, acute 03-01-11    FINSHED Z-PAK-STILL ON PREDNISONE NOW    Past Surgical History  Procedure Date  . Caesarean section 1966/69/72    x 3  . Heel spur surgery 1992    x 2  . Ankle surgery     tumor-benign removed left ankle  . Lumbar disc surgery 2008  . Colectomy 2007    colon cancer  . Pyelonidal cystectomy     at age 84  . Knee arthrosocpy     bil;couple of years before knee replacement  . Ganglion cyst excision > 57yrs ago    removed from left pointer finger  . Cataract surgery 2010  . Tubal ligation 1972  . Cardiac catheterization 90's and 2005  . Port a cath placed 2008  . Port a cath removed 2008  . Knee surgery     left TKA  . Anterior cervical decomp/discectomy fusion 01/12/2011    Procedure: ANTERIOR CERVICAL DECOMPRESSION/DISCECTOMY FUSION 2 LEVELS;  Surgeon: Kathaleen Maser Pool;  Location: [facility];  Service: Neurosurgery;  Laterality: Bilateral;  Cervical five-six, six-seven anterior cervical discectomy and fusion with allograft and plating  . Hysteroscopy w/d&c 02/19/2011    Procedure: DILATATION AND CURETTAGE /HYSTEROSCOPY;  Surgeon: Dara Lords, MD;  Location: [facility];  Service: Gynecology;  Laterality: N/A;  requests one hour  . Dilation and curettage of uterus 02-19-11    & POLYP REMOVAL    Family History  Problem Relation Age of Onset  . Coronary artery disease Other   . Heart attack Mother   . Stroke Mother   . Hypertension Mother   . Heart disease Father   . Hypertension Sister   . Cancer Sister     left kidney  cancer  . Cancer Brother     bladder  . Cancer Brother       liver, spread to colon  . Anesthesia problems Neg Hx   . Hypotension Neg Hx   . Malignant hyperthermia Neg Hx   . Pseudochol deficiency Neg Hx     History   Social History  . Marital Status: Married    Spouse Name: N/A    Number of Children: N/A  . Years of Education: N/A   Occupational History  . retired    Social History Main Topics  . Smoking status: Former Smoker -- 1.5 packs/day for 30 years    Types: Cigarettes  . Smokeless tobacco: Never Used   Comment: quit 19 years ago  . Alcohol Use: No  . Drug Use: No  . Sexually Active: Yes -- Female partner(s)    Birth Control/ Protection: Post-menopausal   Other Topics Concern  . Not on file   Social History Narrative   Regular exercise- no.    ROS:See history of present illness.   PHYSICAL EXAM: Well-nournished, in no acute distress. Neck: No JVD, HJR, Bruit, or thyroid enlargement Lungs: No tachypnea, clear without wheezing, rales, or rhonchi Cardiovascular: RRR, PMI not displaced, heart sounds normal, no murmurs, gallops, bruit, thrill, or heave. Abdomen: BS normal. Soft without organomegaly, masses, lesions or tenderness. Extremities: without cyanosis, clubbing or edema. Good distal pulses bilateral SKin: Warm, no lesions or rashes  Musculoskeletal: No deformities Neuro: no focal signs  BP 130/86  Pulse 81  Ht 5\' 3"  (1.6 m)  Wt 252 lb (114.306 kg)  BMI 44.64 kg/m2  LMP 12/30/2000   ZOX:WRUEAV sinus rhythm no acute change

## 2011-04-05 NOTE — Assessment & Plan Note (Signed)
Patient had another episode of rapid atrial fibrillation on 2/is/13 prompting emergency room visit. She seems to be going in and out of atrial fibrillation more frequently.  Coumadin has been recommended by Dr. Riley Kill on several occasions. She is willing to start this. We will begin today and have her see the Coumadin clinic next week. We'll also add digoxin 0.125 mg daily for better rate control. She cannot take beta blocker because of a history of asthma and has not tolerated higher doses of Cardizem.  Last 2-D echo in 09/16/10 showed normal LV function ejection fraction 55-60%. She had mild LVH.

## 2011-04-05 NOTE — Patient Instructions (Signed)
Your physician has recommended you make the following change in your medication: Please START Coumadin 5 mg tablet.  Take one tablet  daily, and follow up with out Anticoagulation clinic this Friday, Feb. 15, 2013 at 3:15 pm.    Start Digoxin . , Take one tablet each day.    Your physician recommends that you schedule a follow-up appointment in: 1 month with Dr. Riley Kill

## 2011-04-05 NOTE — Assessment & Plan Note (Signed)
stable °

## 2011-04-09 ENCOUNTER — Ambulatory Visit (INDEPENDENT_AMBULATORY_CARE_PROVIDER_SITE_OTHER): Payer: Medicare Other | Admitting: Pharmacist

## 2011-04-09 ENCOUNTER — Encounter: Payer: Medicare Other | Admitting: *Deleted

## 2011-04-09 DIAGNOSIS — Z7901 Long term (current) use of anticoagulants: Secondary | ICD-10-CM

## 2011-04-09 DIAGNOSIS — I4891 Unspecified atrial fibrillation: Secondary | ICD-10-CM

## 2011-04-19 ENCOUNTER — Ambulatory Visit (INDEPENDENT_AMBULATORY_CARE_PROVIDER_SITE_OTHER): Payer: Medicare Other | Admitting: Pharmacist

## 2011-04-19 DIAGNOSIS — I4891 Unspecified atrial fibrillation: Secondary | ICD-10-CM

## 2011-04-19 DIAGNOSIS — Z7901 Long term (current) use of anticoagulants: Secondary | ICD-10-CM

## 2011-04-20 DIAGNOSIS — M6740 Ganglion, unspecified site: Secondary | ICD-10-CM | POA: Diagnosis not present

## 2011-04-26 ENCOUNTER — Ambulatory Visit (INDEPENDENT_AMBULATORY_CARE_PROVIDER_SITE_OTHER): Payer: Medicare Other | Admitting: *Deleted

## 2011-04-26 ENCOUNTER — Telehealth: Payer: Self-pay | Admitting: Cardiology

## 2011-04-26 DIAGNOSIS — Z7901 Long term (current) use of anticoagulants: Secondary | ICD-10-CM | POA: Diagnosis not present

## 2011-04-26 DIAGNOSIS — I4891 Unspecified atrial fibrillation: Secondary | ICD-10-CM

## 2011-04-26 NOTE — Telephone Encounter (Signed)
Walk In pt Form Dropped Off " pt Dropped Off Clearance Form from [facility], needs  To be completed ASAP" Will place on [place] Cart For Stuckey to Review 04/27/11 ( Next time in office) 04/26/11/KM

## 2011-04-28 ENCOUNTER — Other Ambulatory Visit: Payer: Self-pay | Admitting: Internal Medicine

## 2011-04-28 ENCOUNTER — Telehealth: Payer: Self-pay | Admitting: Cardiology

## 2011-04-28 ENCOUNTER — Other Ambulatory Visit: Payer: Self-pay | Admitting: Cardiology

## 2011-04-28 DIAGNOSIS — I4891 Unspecified atrial fibrillation: Secondary | ICD-10-CM

## 2011-04-28 NOTE — Telephone Encounter (Signed)
New Msg:  Pt calling wanting to know if pt can stop taking digoxin. Pt c/o headaches, sweating, nervousness, fatigue, dizziness with medication.   Please return pt call to discuss further.

## 2011-04-28 NOTE — Telephone Encounter (Signed)
The pt has noticed worsening symptoms over the past week (headaches, sweating, fatigue, dizziness, blurred vision). I instructed the pt to stop taking digoxin and come into the office on Thursday morning for a digoxin level to be drawn.  The pt agreed with plan.  The pt also needs clearance for orthopaedic surgery and I made her aware that Dr Riley Kill will discuss clearance at her upcoming appointment on 05/18/11.

## 2011-04-29 ENCOUNTER — Other Ambulatory Visit (INDEPENDENT_AMBULATORY_CARE_PROVIDER_SITE_OTHER): Payer: Medicare Other

## 2011-04-29 ENCOUNTER — Telehealth: Payer: Self-pay | Admitting: *Deleted

## 2011-04-29 DIAGNOSIS — I4891 Unspecified atrial fibrillation: Secondary | ICD-10-CM

## 2011-04-29 DIAGNOSIS — I10 Essential (primary) hypertension: Secondary | ICD-10-CM

## 2011-04-29 LAB — BASIC METABOLIC PANEL
BUN: 16 mg/dL (ref 6–23)
CO2: 25 mEq/L (ref 19–32)
Chloride: 105 mEq/L (ref 96–112)
Glucose, Bld: 194 mg/dL — ABNORMAL HIGH (ref 70–99)
Potassium: 3.7 mEq/L (ref 3.5–5.1)

## 2011-04-29 NOTE — Telephone Encounter (Signed)
lmom labs ok. Burke Terry  

## 2011-04-29 NOTE — Telephone Encounter (Signed)
I spoke with the pt and made her aware that the digoxin level was not drawn in her lab work today.  I spoke with Dr Riley Kill and he would like the pt seen in our office tomorrow. I have scheduled her to be seen on 04/30/11 with Norma Fredrickson NP.

## 2011-04-29 NOTE — Telephone Encounter (Signed)
Message copied by Tarri Fuller on Thu Apr 29, 2011  4:45 PM ------      Message from: [place], [place] T      Created: Thu Apr 29, 2011  1:37 PM       Please notify patient that the lab results are ok.      Tereso Newcomer, PA-C  1:37 PM 04/29/2011

## 2011-04-30 ENCOUNTER — Ambulatory Visit (INDEPENDENT_AMBULATORY_CARE_PROVIDER_SITE_OTHER): Payer: Medicare Other | Admitting: Nurse Practitioner

## 2011-04-30 ENCOUNTER — Encounter: Payer: Self-pay | Admitting: Nurse Practitioner

## 2011-04-30 ENCOUNTER — Ambulatory Visit (INDEPENDENT_AMBULATORY_CARE_PROVIDER_SITE_OTHER): Payer: Medicare Other

## 2011-04-30 DIAGNOSIS — R079 Chest pain, unspecified: Secondary | ICD-10-CM | POA: Diagnosis not present

## 2011-04-30 DIAGNOSIS — Z7901 Long term (current) use of anticoagulants: Secondary | ICD-10-CM | POA: Diagnosis not present

## 2011-04-30 DIAGNOSIS — I4891 Unspecified atrial fibrillation: Secondary | ICD-10-CM

## 2011-04-30 DIAGNOSIS — R69 Illness, unspecified: Secondary | ICD-10-CM

## 2011-04-30 DIAGNOSIS — R6889 Other general symptoms and signs: Secondary | ICD-10-CM | POA: Insufficient documentation

## 2011-04-30 LAB — POCT INR: INR: 3.3

## 2011-04-30 NOTE — Progress Notes (Signed)
1`  [name] Date of Birth: 1940-11-17 Medical Record #[id number]  History of Present Illness: Ms. Weight is seen today for a work in visit. She is seen for Dr. Riley Kill. She has PAF. Had refused coumadin in the past, but now taking. Was seen almost a month ago and was reporting more frequent episodes of atrial fib. They were described as quick and short acting episodes that resolve spontaneously. Last echo was in July of 2012 showing normal LV function with an EF of 55 to 60%. She did have mild LVH. She has not tolerated beta blockers due to wheezing and is only able to tolerate 240 mg of Cardizem. At that last visit, she was started on Lanoxin 0.125 mg daily and coumadin anticoagulation.  She called earlier this week with complaints of headaches, sweating, fatigue, dizziness and blurred vision. She was instructed to stop the Digoxin. Labs were checked but unfortunately a digoxin level was not included. Dr. Riley Kill asked for someone to see her. She comes in today. She says she still feels bad. Not quite clear if she is better off of the Digoxin. She continues to have headaches. Feels like she has "ice picks" in her ears. She is staying fatigued. Says she can't even shower without feeling so "give out". She does note some occasional chest discomfort at times. Does not seem exertional but she is really not able to exert herself. She says her sugars are running high. Dr. Debby Bud gave her some additional medicine but told her it may cause some GI upset. She already has GI issues, so she did not take it. Blood pressure at home is in the 140's.   Current Outpatient Prescriptions on File Prior to Visit  Medication Sig Dispense Refill  . albuterol (PROVENTIL HFA;VENTOLIN HFA) 108 (90 BASE) MCG/ACT inhaler Inhale 2 puffs into the lungs every 6 (six) hours as needed. Shortness of breath      . BAYER CONTOUR TEST test strip USE AS DIRECTED  50 strip  2  . benazepril (LOTENSIN) 20 MG tablet Take 1 tablet (20  mg total) by mouth 2 (two) times daily.  60 tablet  11  . colchicine 0.6 MG tablet Take 0.6 mg by mouth daily as needed. gout      . digoxin (LANOXIN) 0.125 MG tablet Take 1 tablet (125 mcg total) by mouth daily.  30 tablet  11  . diltiazem (CARDIZEM CD) 240 MG 24 hr capsule Take 240 mg by mouth daily.        . furosemide (LASIX) 40 MG tablet TAKE 1 TABLET EVERY DAY  30 tablet  4  . glimepiride (AMARYL) 4 MG tablet Take 4 mg by mouth daily before breakfast.      . loperamide (IMODIUM) 2 MG capsule Take 2 mg by mouth 4 (four) times daily as needed. Loose stools      . MICROLET LANCETS MISC by Does not apply route.       [name] oxyCODONE-acetaminophen (PERCOCET) 5-325 MG per tablet Take 1 tablet by mouth every 4 (four) hours as needed. pain       . promethazine (PHENERGAN) 25 MG tablet Take 25 mg by mouth every 6 (six) hours as needed. Only takes for severe nausea      . terconazole (TERAZOL 7) 0.4 % vaginal cream INSERT 1 APPLICATORFUL VAGINALLY AT BEDTIME FOR 7 NIGHTS  45 g  0  . traMADol (ULTRAM) 50 MG tablet Take 50 mg by mouth every 6 (six) hours as needed. For  pain      . warfarin (COUMADIN) 5 MG tablet Take 1 tablet (5 mg total) by mouth daily.  30 tablet  4    Allergies  Allergen Reactions  . Morphine     REACTION: Reaction not  Makes you feel bad all over  . Nifedipine     REACTION: hives and itching  . Piroxicam     REACTION: itching and hives    Past Medical History  Diagnosis Date  . Allergic rhinitis   . Asthma   . Gout   . Hypertension   . Peptic ulcer disease   . History of PSVT (paroxysmal supraventricular tachycardia)   . Colon cancer 2007    s/p surgery and chemo  . Hx of colonoscopy   . HLD (hyperlipidemia)   . DJD (degenerative joint disease)   . History of blood clots     right lung in early 90's  . Atrial fibrillation     pt hasn't been on Coumadin;cardiologist is  Dr.Stuckey  . Sleep apnea     doesn't use CPAP  . Shortness of breath     pt states that she  can be sitting as well as exertion and get short of breath  . Bronchitis     last time a year ago  . History of migraine headaches     last migraine 33yrs ago;Pt states she does get sinus headaches  . Fibromyalgia   . Osteoarthritis   . Chronic back pain     hx buldging disc  . Rash     on neck and started after she started taking her beta blocker 58yrs ago;medical MD has seen this  . GERD (gastroesophageal reflux disease)     uses Rolaids prn  . Gastric ulcer   . Hemorrhoids   . Family hx of colon cancer   . Colon cancer   . Diabetes mellitus     glimepiride  . Anxiety     and panic attacks;pt states that she is claustrophobic  . Yeast infection of the vagina     being treated for this now by dr.fontaine  . Spinal headache     after 2nd child  . Bronchitis, acute 03-01-11    FINSHED Z-PAK-STILL ON PREDNISONE NOW    Past Surgical History  Procedure Date  . Caesarean section 1966/69/72    x 3  . Heel spur surgery 1992    x 2  . Ankle surgery     tumor-benign removed left ankle  . Lumbar disc surgery 2008  . Colectomy 2007    colon cancer  . Pyelonidal cystectomy     at age 6  . Knee arthrosocpy     bil;couple of years before knee replacement  . Ganglion cyst excision > 81yrs ago    removed from left pointer finger  . Cataract surgery 2010  . Tubal ligation 1972  . Cardiac catheterization 90's and 2005  . Port a cath placed 2008  . Port a cath removed 2008  . Knee surgery     left TKA  . Anterior cervical decomp/discectomy fusion 01/12/2011    Procedure: ANTERIOR CERVICAL DECOMPRESSION/DISCECTOMY FUSION 2 LEVELS;  Surgeon: Kathaleen Maser Pool;  Location: [facility];  Service: Neurosurgery;  Laterality: Bilateral;  Cervical five-six, six-seven anterior cervical discectomy and fusion with allograft and plating  . Hysteroscopy w/d&c 02/19/2011    Procedure: DILATATION AND CURETTAGE /HYSTEROSCOPY;  Surgeon: Dara Lords, MD;  Location: [facility];  Service: Gynecology;  Laterality: N/A;  requests one hour  . Dilation and curettage of uterus 02-19-11    & POLYP REMOVAL    History  Smoking status  . Former Smoker -- 1.5 packs/day for 30 years  . Types: Cigarettes  Smokeless tobacco  . Never Used  Comment: quit 19 years ago    History  Alcohol Use No    Family History  Problem Relation Age of Onset  . Coronary artery disease Other   . Heart attack Mother   . Stroke Mother   . Hypertension Mother   . Heart disease Father   . Hypertension Sister   . Cancer Sister     left kidney cancer  . Cancer Brother     bladder  . Cancer Brother     liver, spread to colon  . Anesthesia problems Neg Hx   . Hypotension Neg Hx   . Malignant hyperthermia Neg Hx   . Pseudochol deficiency Neg Hx     Review of Systems: The review of systems is per the HPI.  All other systems were reviewed and are negative.  Physical Exam: BP 146/72  Pulse 73  Ht 5\' 3"  (1.6 m)  Wt 252 lb (114.306 kg)  BMI 44.64 kg/m2  LMP 12/30/2000  LABORATORY DATA: Lab Results  Component Value Date   WBC 8.1 03/31/2011   HGB 14.2 03/31/2011   HCT 41.8 03/31/2011   PLT 184 03/31/2011   GLUCOSE 194* 04/29/2011   CHOL 212* 04/24/2010   TRIG 239.0* 04/24/2010   HDL 60.10 04/24/2010   LDLDIRECT 130.6 04/24/2010   LDLCALC  Value: 132        Total Cholesterol/HDL:CHD Risk Coronary Heart Disease Risk Table                     Men   Women  1/2 Average Risk   3.4   3.3  Average Risk       5.0   4.4  2 X Average Risk   9.6   7.1  3 X Average Risk  23.4   11.0        Use the calculated Patient Ratio above and the CHD Risk Table to determine the patient's CHD Risk.        ATP III CLASSIFICATION (LDL):  <100     mg/dL   Optimal  161-096  mg/dL   Near or Above                    Optimal  130-159  mg/dL   Borderline  045-409  mg/dL   High  >811     mg/dL   Very High* 11/06/7827   ALT 20 02/09/2011   AST 19 02/09/2011   NA 139 04/29/2011   K 3.7 04/29/2011   CL 105 04/29/2011   CREATININE 0.8 04/29/2011   BUN 16  04/29/2011   CO2 25 04/29/2011   TSH 1.779 09/09/2010   INR 2.0 04/26/2011   HGBA1C 7.8* 11/18/2010   MICROALBUR 15.0* 12/25/2008   EKG today shows sinus rhythm.   Assessment / Plan:

## 2011-04-30 NOTE — Patient Instructions (Signed)
We are going to arrange for you to have a stress test next week.  Stay off the Digoxin.  Keep your appointment with Dr. Riley Kill for later this month.  Good blood sugar control is key. Talk with Dr. Debby Bud.   Call the [facility] office at [phone] if you have any questions, problems or concerns.

## 2011-04-30 NOTE — Assessment & Plan Note (Signed)
Patient presents with multiple somatic complaints. May have had some improvement off of the Digoxin but I think it is difficult to tell. She has multiple CV risk factors and complains of some chest heaviness and this significant fatigue. She says she has had remote cath but not since 2005. No recent stress testing. Will refer for Lexiscan to help sort out her chest pain. I think the crux of the issue is her high sugars and just generalized poor health. I have encouraged better blood sugar control. She is in sinus rhythm today. I have left her off of the Digoxin. May need to see EP for further evaluation. Will defer to Dr. Riley Kill. She has an appointment to see him later this month and she will keep that appointment. Patient is agreeable to this plan and will call if any problems develop in the interim.

## 2011-05-06 ENCOUNTER — Other Ambulatory Visit (INDEPENDENT_AMBULATORY_CARE_PROVIDER_SITE_OTHER): Payer: Medicare Other

## 2011-05-06 ENCOUNTER — Encounter: Payer: Self-pay | Admitting: Internal Medicine

## 2011-05-06 ENCOUNTER — Ambulatory Visit (INDEPENDENT_AMBULATORY_CARE_PROVIDER_SITE_OTHER): Payer: Medicare Other | Admitting: Internal Medicine

## 2011-05-06 VITALS — BP 160/90 | HR 73 | Temp 97.1°F | Resp 16 | Wt 254.0 lb

## 2011-05-06 DIAGNOSIS — I10 Essential (primary) hypertension: Secondary | ICD-10-CM

## 2011-05-06 DIAGNOSIS — E1149 Type 2 diabetes mellitus with other diabetic neurological complication: Secondary | ICD-10-CM | POA: Diagnosis not present

## 2011-05-06 DIAGNOSIS — M791 Myalgia, unspecified site: Secondary | ICD-10-CM

## 2011-05-06 DIAGNOSIS — IMO0001 Reserved for inherently not codable concepts without codable children: Secondary | ICD-10-CM

## 2011-05-06 DIAGNOSIS — E1143 Type 2 diabetes mellitus with diabetic autonomic (poly)neuropathy: Secondary | ICD-10-CM

## 2011-05-06 DIAGNOSIS — M109 Gout, unspecified: Secondary | ICD-10-CM

## 2011-05-06 DIAGNOSIS — E119 Type 2 diabetes mellitus without complications: Secondary | ICD-10-CM

## 2011-05-06 DIAGNOSIS — J45909 Unspecified asthma, uncomplicated: Secondary | ICD-10-CM

## 2011-05-06 DIAGNOSIS — E1142 Type 2 diabetes mellitus with diabetic polyneuropathy: Secondary | ICD-10-CM | POA: Diagnosis not present

## 2011-05-06 DIAGNOSIS — I4891 Unspecified atrial fibrillation: Secondary | ICD-10-CM

## 2011-05-06 DIAGNOSIS — K3184 Gastroparesis: Secondary | ICD-10-CM

## 2011-05-06 MED ORDER — PROMETHAZINE HCL 25 MG PO TABS: 25.0000 mg | ORAL_TABLET | Freq: Four times a day (QID) | ORAL | Status: DC | PRN

## 2011-05-06 MED ORDER — LINAGLIPTIN 5 MG PO TABS: 5.0000 mg | ORAL_TABLET | Freq: Every day | ORAL | Status: DC

## 2011-05-06 NOTE — Progress Notes (Signed)
Subjective:    Patient ID: [name], female    DOB: 1940/07/06, 71 y.o.   MRN: [id number]  HPI [name] presents due to elevated blood sugar. Her last A1c Sept '12 = 7.8% She is intolerant of metformin for GI side effects; she is fearful of taking ACTOS due to bladder cancer scare. She is on amaryl alone. She reports that she has modified her diet: no sugar, low carb and reduced portions but is unable to loose weight.   Exercise is limited by poor endurance and muscle pain and weakness. She has no history of inflammatory disease. She has had normal thyroid function, normal renal function, normal electrolytes and normal hemoglobin.  She has had a erythematous macular rash in the upper chest and anterior neck - sun exposed areas. She has itching but this is usually under her chin where there is no rash.  She c/o chronic nausea with eating along with early and prolonged satiety.   Past Medical History  Diagnosis Date  . Allergic rhinitis   . Asthma   . Gout   . Hypertension   . Peptic ulcer disease   . History of PSVT (paroxysmal supraventricular tachycardia)   . Colon cancer 2007    s/p surgery and chemo  . Hx of colonoscopy   . HLD (hyperlipidemia)   . DJD (degenerative joint disease)   . History of blood clots     right lung in early 90's  . Atrial fibrillation     pt hasn't been on Coumadin;cardiologist is  Dr.Stuckey  . Sleep apnea     doesn't use CPAP  . Shortness of breath     pt states that she can be sitting as well as exertion and get short of breath  . Bronchitis     last time a year ago  . History of migraine headaches     last migraine 8yrs ago;Pt states she does get sinus headaches  . Fibromyalgia   . Osteoarthritis   . Chronic back pain     hx buldging disc  . Rash     on neck and started after she started taking her beta blocker 64yrs ago;medical MD has seen this  . GERD (gastroesophageal reflux disease)     uses Rolaids prn  . Gastric ulcer   .  Hemorrhoids   . Family hx of colon cancer   . Colon cancer   . Diabetes mellitus     glimepiride  . Anxiety     and panic attacks;pt states that she is claustrophobic  . Yeast infection of the vagina     being treated for this now by dr.fontaine  . Spinal headache     after 2nd child  . Bronchitis, acute 03-01-11    FINSHED Z-PAK-STILL ON PREDNISONE NOW   Past Surgical History  Procedure Date  . Caesarean section 1966/69/72    x 3  . Heel spur surgery 1992    x 2  . Ankle surgery     tumor-benign removed left ankle  . Lumbar disc surgery 2008  . Colectomy 2007    colon cancer  . Pyelonidal cystectomy     at age 63  . Knee arthrosocpy     bil;couple of years before knee replacement  . Ganglion cyst excision > 16yrs ago    removed from left pointer finger  . Cataract surgery 2010  . Tubal ligation 1972  . Cardiac catheterization 90's and 2005  . Port a cath placed  2008  . Port a cath removed 2008  . Knee surgery     left TKA  . Anterior cervical decomp/discectomy fusion 01/12/2011    Procedure: ANTERIOR CERVICAL DECOMPRESSION/DISCECTOMY FUSION 2 LEVELS;  Surgeon: Kathaleen Maser Pool;  Location: [facility];  Service: Neurosurgery;  Laterality: Bilateral;  Cervical five-six, six-seven anterior cervical discectomy and fusion with allograft and plating  . Hysteroscopy w/d&c 02/19/2011    Procedure: DILATATION AND CURETTAGE /HYSTEROSCOPY;  Surgeon: Dara Lords, MD;  Location: [facility];  Service: Gynecology;  Laterality: N/A;  requests one hour  . Dilation and curettage of uterus 02-19-11    & POLYP REMOVAL   Family History  Problem Relation Age of Onset  . Coronary artery disease Other   . Heart attack Mother   . Stroke Mother   . Hypertension Mother   . Heart disease Father   . Hypertension Sister   . Cancer Sister     left kidney cancer  . Cancer Brother     bladder  . Cancer Brother     liver, spread to colon  . Liver cancer Brother   . Anesthesia problems Neg Hx    . Hypotension Neg Hx   . Malignant hyperthermia Neg Hx   . Pseudochol deficiency Neg Hx    History   Social History  . Marital Status: Married    Spouse Name: N/A    Number of Children: N/A  . Years of Education: N/A   Occupational History  . retired    Social History Main Topics  . Smoking status: Former Smoker -- 1.5 packs/day for 30 years    Types: Cigarettes  . Smokeless tobacco: Never Used   Comment: quit 19 years ago  . Alcohol Use: No  . Drug Use: No  . Sexually Active: Yes -- Female partner(s)    Birth Control/ Protection: Post-menopausal   Other Topics Concern  . Not on file   Social History Narrative   Regular exercise- no.      Review of Systems  Constitutional: Positive for fatigue. Negative for fever and activity change.  HENT: Negative for hearing loss, congestion, sore throat, facial swelling and neck pain.   Eyes: Negative.   Respiratory: Positive for shortness of breath. Negative for apnea, chest tightness and wheezing.   Cardiovascular: Negative.   Gastrointestinal: Negative for nausea, abdominal pain, constipation and abdominal distention.  Genitourinary: Negative.   Musculoskeletal: Positive for myalgias, back pain and arthralgias.  Skin: Positive for rash.  Neurological: Negative for dizziness, speech difficulty, numbness and headaches.  Hematological: Negative.   Psychiatric/Behavioral: Negative for behavioral problems, dysphoric mood and agitation. The patient is nervous/anxious.        Objective:   Physical Exam Filed Vitals:   05/06/11 0945  BP: 160/90  Pulse: 73  Temp: 97.1 F (36.2 C)  Resp: 16  Weight: 254 lb (115.214 kg)  Gen'l - overweight white woman in no acute distress HEENT- C&S clear, PERRLA, EOMI Neck- supple PUlm - normal respirations, w/o wheezing Cor - RRR Ext - no deformity, Knees swollen Derm - macular erythema upper chest and neck      Assessment & Plan:  Myalgias - will order CK total and ESR to r/o  myositis.  Derm - reassured that the skin color change around her neck is unlikely to be a drug reaction.

## 2011-05-06 NOTE — Patient Instructions (Signed)
Diabetes - last A1C in Sept '12 was 7.8 % above goal of 7% or less. Amaryl is falling out of favor - not effective enough and carries a risk of having low blood sugar attacks. Plan - stop amaryl, start tradjenta 5 mg once a day. (samples). If you blood sugars do better we can continue this. If you do not see good control we will have to give serious consideration to starting basal insulin therapy.  Chronic nausea - concerned that you may have diabetic gastroparesis - a slowing of normal parestalsis - gut motility so that your stomach does not empty. Plan - a gastric emptying study to be done at the hospital as an outpatient.   Muscle pain and poor endurance. The heart doctors will be sure this isn't a heart problem. Lab today will look for any inflammatory disease of the muscles that could be a problem.  Rash - Not likely to be a drug reaction since is is localized. It does not look worrisome. If it persists will refer you to a dermatologist.    Gastroparesis   Gastroparesis is also called slowed stomach emptying (delayed gastric emptying). It is a condition in which the stomach takes too long to empty its contents. It often happens in people with diabetes.   CAUSES   Gastroparesis happens when nerves to the stomach are damaged or stop working. When the nerves are damaged, the muscles of the stomach and intestines do not work normally. The movement of food is slowed or stopped. High blood glucose (sugar) causes changes in nerves and can damage the blood vessels that carry oxygen and nutrients to the nerves. RISK FACTORS  Diabetes.   Post-viral syndromes.   Eating disorders (anorexia, bulimia).   Surgery on the stomach or vagus nerve.   Gastroesophageal reflux disease (rarely).   Smooth muscle disorders (amyloidosis, scleroderma).   Metabolic disorders, including hypothyroidism.   Parkinson's disease.  SYMPTOMS    Heartburn.   Feeling sick to your stomach (nausea).   Vomiting of  undigested food.   An early feeling of fullness when eating.   Weight loss.   Abdominal bloating.   Erratic blood glucose levels.   Lack of appetite.   Gastroesophageal reflux.   Spasms of the stomach wall.  Complications can include:  Bacterial overgrowth in stomach. Food stays in the stomach and can ferment and cause bacteria to grow.   Weight loss due to difficulty digesting and absorbing nutrients.   Vomiting.   Obstruction in the stomach. Undigested food can harden and cause nausea and vomiting.   Blood glucose fluctuations caused by inconsistent food absorption.  DIAGNOSIS   The diagnosis of gastroparesis is confirmed through one or more of the following tests:  Barium X-rays and scans. These tests look at how long it takes for food to move through the stomach.   Gastric manometry. This test measures electrical and muscular activity in the stomach. A thin tube is passed down the throat into the stomach. The tube contains a wire that takes measurements of the stomach's electrical and muscular activity as it digests liquids and solid food.   Endoscopy. This procedure is done with a long, thin tube called an endoscope. It is passed through the mouth and gently guides down the esophagus into the stomach. This tube helps the caregiver look at the lining of the stomach to check for any abnormalities.   Ultrasound. This can rule out gallbladder disease or pancreatitis. This test will outline and define the shape  of the gallbladder and pancreas.  TREATMENT    The primary treatment is to identify the problem and help control blood glucose levels. Treatments include:   Exercise.   Medicines to control nausea and vomiting.   Medicines to stimulate stomach muscles.   Changes in what and when you eat.   Having smaller meals more often.   Eating low-fiber forms of high-fiber foods, such aseating cooked vegetables instead of raw vegetables.   Eating low-fat foods.    Consuming liquids, which are easier to digest.   In severe cases, feeding tubes and intravenous (IV) feeding may be needed.  It is important to note that in most cases, treatment does not cure gastroparesis. It is usually a lasting (chronic) condition. Treatment helps you manage the condition so that you can be as healthy and comfortable as possible. NEW TREATMENTS  A gastric neurostimulator has been developed to assist people with gastroparesis. The battery-operated device is surgically implanted. It emits mild electrical pulses to help improve stomach emptying and to control nausea and vomiting.   The use of botulinum toxin has been shown to improve stomach emptying by decreasing the prolonged contractions of the muscle between the stomach and the small intestine (pyloric sphincter). The benefits are temporary.  SEEK MEDICAL CARE IF:    You are having problems keeping your blood glucose in goal range.   You are having nausea, vomiting, bloating, or early feelings of fullness with eating.   Your symptoms do not change with a change in diet.  Document Released: 02/08/2005 Document Revised: 01/28/2011 Document Reviewed: 07/18/2008 [facility] Patient Information 2012 [place], [place].

## 2011-05-08 NOTE — Assessment & Plan Note (Signed)
BP Readings from Last 3 Encounters:  05/06/11 160/90  04/30/11 146/72  04/05/11 130/86   Suboptimal control. Will follow closely and adjust meds.

## 2011-05-08 NOTE — Assessment & Plan Note (Signed)
Stable with no wheezing on exam today.

## 2011-05-08 NOTE — Assessment & Plan Note (Signed)
Holding sinus rhythm. 

## 2011-05-08 NOTE — Assessment & Plan Note (Signed)
Lab Results  Component Value Date   HGBA1C 7.8* 11/18/2010   Not well controlled on amaryl.   Plan - trial of trajenta 5 mg daily.           Discussed possible need for basal insulin therapy  Chronic nausea and prolonged satiety suggestive of gastroparesis  Plan - gastric emptying study.

## 2011-05-10 ENCOUNTER — Ambulatory Visit (INDEPENDENT_AMBULATORY_CARE_PROVIDER_SITE_OTHER): Payer: Medicare Other | Admitting: Pharmacist

## 2011-05-10 DIAGNOSIS — I4891 Unspecified atrial fibrillation: Secondary | ICD-10-CM | POA: Diagnosis not present

## 2011-05-10 DIAGNOSIS — Z7901 Long term (current) use of anticoagulants: Secondary | ICD-10-CM | POA: Diagnosis not present

## 2011-05-12 ENCOUNTER — Ambulatory Visit (HOSPITAL_COMMUNITY): Payer: Medicare Other | Attending: Nurse Practitioner | Admitting: Radiology

## 2011-05-12 DIAGNOSIS — Z87891 Personal history of nicotine dependence: Secondary | ICD-10-CM | POA: Diagnosis not present

## 2011-05-12 DIAGNOSIS — R0602 Shortness of breath: Secondary | ICD-10-CM | POA: Insufficient documentation

## 2011-05-12 DIAGNOSIS — I10 Essential (primary) hypertension: Secondary | ICD-10-CM | POA: Insufficient documentation

## 2011-05-12 DIAGNOSIS — E785 Hyperlipidemia, unspecified: Secondary | ICD-10-CM | POA: Insufficient documentation

## 2011-05-12 DIAGNOSIS — J45909 Unspecified asthma, uncomplicated: Secondary | ICD-10-CM | POA: Insufficient documentation

## 2011-05-12 DIAGNOSIS — R0609 Other forms of dyspnea: Secondary | ICD-10-CM | POA: Diagnosis not present

## 2011-05-12 DIAGNOSIS — R61 Generalized hyperhidrosis: Secondary | ICD-10-CM | POA: Insufficient documentation

## 2011-05-12 DIAGNOSIS — R5381 Other malaise: Secondary | ICD-10-CM | POA: Insufficient documentation

## 2011-05-12 DIAGNOSIS — R079 Chest pain, unspecified: Secondary | ICD-10-CM | POA: Diagnosis not present

## 2011-05-12 DIAGNOSIS — Z8249 Family history of ischemic heart disease and other diseases of the circulatory system: Secondary | ICD-10-CM | POA: Insufficient documentation

## 2011-05-12 DIAGNOSIS — I4891 Unspecified atrial fibrillation: Secondary | ICD-10-CM | POA: Insufficient documentation

## 2011-05-12 DIAGNOSIS — R0989 Other specified symptoms and signs involving the circulatory and respiratory systems: Secondary | ICD-10-CM | POA: Insufficient documentation

## 2011-05-12 DIAGNOSIS — R42 Dizziness and giddiness: Secondary | ICD-10-CM | POA: Diagnosis not present

## 2011-05-12 DIAGNOSIS — E119 Type 2 diabetes mellitus without complications: Secondary | ICD-10-CM | POA: Diagnosis not present

## 2011-05-12 MED ORDER — REGADENOSON 0.4 MG/5ML IV SOLN
0.4000 mg | Freq: Once | INTRAVENOUS | Status: AC
  Administered 2011-05-12: 0.4 mg via INTRAVENOUS

## 2011-05-12 MED ORDER — TECHNETIUM TC 99M TETROFOSMIN IV KIT
33.0000 | PACK | Freq: Once | INTRAVENOUS | Status: AC | PRN
  Administered 2011-05-12: 33 via INTRAVENOUS

## 2011-05-12 NOTE — Progress Notes (Signed)
[facility] SITE 3 NUCLEAR MED [address] [place] [address] [phone]  Cardiology Nuclear Med Study  [name] is a 71 y.o. female     MRN : [id number]     DOB: 07-Dec-1940  Procedure Date: 05/12/2011  Nuclear Med Background Indication for Stress Test:  Evaluation for Ischemia History:  Asthma, AFib, 2005 Heart Cath: Nl Coronaries, EF: 60%, 2007: H/O Colon CA, 2011: MPS: NL EF: 66% 08/2010 ECHO: EF: 55-60% mild LVH, H/O PSVT  Cardiac Risk Factors: Family History - CAD, History of Smoking, Hypertension, Lipids and NIDDM  Symptoms:  Chest Pain, Diaphoresis, Dizziness, DOE, Fatigue and SOB   Nuclear Pre-Procedure Caffeine/Decaff Intake:  None NPO After: 5:30 pm   Lungs:  clear O2 Sat: 94% on room air. IV 0.9% NS with Angio Cath:  22g  IV Site: R Hand  IV Started by:  Bonnita Levan, RN  Chest Size (in):  44 Cup Size: DD  Height: 5\' 3"  (1.6 m)  Weight:  200 lb (90.719 kg)  BMI:  Body mass index is 35.43 kg/(m^2). Tech Comments:  Patient held Diabetic Med and Lasix this AM    Nuclear Med Study 1 or 2 day study: 2 day  Stress Test Type:  Eugenie Birks  Reading MD: Charlton Haws, MD  Order Authorizing Provider:  T.Stuckey  Resting Radionuclide: Technetium 80m Tetrofosmin  Resting Radionuclide Dose: 33.0 mCi  04/2111  Stress Radionuclide:  Technetium 25m Tetrofosmin  Stress Radionuclide Dose: 32.9 mCi  05/12/11          Stress Protocol Rest HR: 71 Stress HR: 82  Rest BP: 153/76 Stress BP: 152/79  Exercise Time (min): n/a METS: n/a   Predicted Max HR: 151 bpm % Max HR: 54.3 bpm Rate Pressure Product: 46962   Dose of Adenosine (mg):  n/a Dose of Lexiscan: 0.4 mg  Dose of Atropine (mg): n/a Dose of Dobutamine: n/a mcg/kg/min (at max HR)  Stress Test Technologist: Milana Na, EMT-P  Nuclear Technologist:  Domenic Polite, CNMT     Rest Procedure:  Myocardial perfusion imaging was performed at rest 45 minutes following the intravenous administration of  Technetium 42m Tetrofosmin. Rest ECG: NSR - Normal EKG  Stress Procedure:  The patient received IV Lexiscan 0.4 mg over 15-seconds.  Technetium 47m Tetrofosmin injected at 30-seconds.  There were no significant changes,+ leg pain, headache, and sob with Lexiscan.  Quantitative spect images were obtained after a 45 minute delay. Stress ECG: No significant change from baseline ECG  QPS Raw Data Images:  Normal; no motion artifact; normal heart/lung ratio. Stress Images:  Normal homogeneous uptake in all areas of the myocardium. Rest Images:  Normal homogeneous uptake in all areas of the myocardium. Subtraction (SDS):  Normal Transient Ischemic Dilatation (Normal <1.22):  1.04 Lung/Heart Ratio (Normal <0.45):  0.27  Quantitative Gated Spect Images QGS EDV:  98 ml QGS ESV:  30 ml  Impression Exercise Capacity:  Lexiscan with no exercise. BP Response:  Normal blood pressure response. Clinical Symptoms:  There is dyspnea. ECG Impression:  No significant ST segment change suggestive of ischemia. Comparison with Prior Nuclear Study: No images to compare  Overall Impression:  Normal stress nuclear study.  LV Ejection Fraction: 70%.  LV Wall Motion:  NL LV Function; NL Wall Motion  Charlton Haws

## 2011-05-13 ENCOUNTER — Ambulatory Visit (HOSPITAL_COMMUNITY): Payer: Medicare Other | Attending: Cardiovascular Disease

## 2011-05-13 ENCOUNTER — Other Ambulatory Visit: Payer: Self-pay | Admitting: Cardiology

## 2011-05-13 DIAGNOSIS — M47812 Spondylosis without myelopathy or radiculopathy, cervical region: Secondary | ICD-10-CM | POA: Diagnosis not present

## 2011-05-13 DIAGNOSIS — R0989 Other specified symptoms and signs involving the circulatory and respiratory systems: Secondary | ICD-10-CM

## 2011-05-13 MED ORDER — TECHNETIUM TC 99M TETROFOSMIN IV KIT
33.0000 | PACK | Freq: Once | INTRAVENOUS | Status: AC | PRN
  Administered 2011-05-13: 33 via INTRAVENOUS

## 2011-05-14 ENCOUNTER — Encounter (HOSPITAL_COMMUNITY)
Admission: RE | Admit: 2011-05-14 | Discharge: 2011-05-14 | Disposition: A | Discharge status: Home / Self Care | Payer: Medicare Other | Source: Ambulatory Visit | Attending: Internal Medicine | Admitting: Internal Medicine

## 2011-05-14 DIAGNOSIS — K3184 Gastroparesis: Secondary | ICD-10-CM

## 2011-05-18 ENCOUNTER — Encounter: Payer: Self-pay | Admitting: Cardiology

## 2011-05-18 ENCOUNTER — Ambulatory Visit (INDEPENDENT_AMBULATORY_CARE_PROVIDER_SITE_OTHER): Payer: Medicare Other | Admitting: Cardiology

## 2011-05-18 VITALS — BP 140/90 | HR 80 | Ht 63.0 in | Wt 254.1 lb

## 2011-05-18 DIAGNOSIS — R079 Chest pain, unspecified: Secondary | ICD-10-CM

## 2011-05-18 DIAGNOSIS — I4891 Unspecified atrial fibrillation: Secondary | ICD-10-CM | POA: Diagnosis not present

## 2011-05-18 DIAGNOSIS — M5412 Radiculopathy, cervical region: Secondary | ICD-10-CM

## 2011-05-18 NOTE — Patient Instructions (Signed)
Your physician recommends that you schedule a follow-up appointment in: 3 MONTHS  Your physician recommends that you continue on your current medications as directed. Please refer to the Current Medication list given to you today.   

## 2011-05-18 NOTE — Progress Notes (Signed)
HPI: Patient is no longer taking digoxin.  She is feeling better.  She is now over the past few days having a lot of issues with her neck.  This is new and recurrent.  She saw Dr. Dutch Quint and was placed on Meloxicam.  She is supposed to have hand surgery with Dr. Melvyn Novas.  She has no cardiac symptoms, and she is on warfarin for PAF.  She has a CHADS score that is elevated and appropriate for anticoagulation.    Current Outpatient Prescriptions  Medication Sig Dispense Refill  . albuterol (PROVENTIL HFA;VENTOLIN HFA) 108 (90 BASE) MCG/ACT inhaler Inhale 2 puffs into the lungs every 6 (six) hours as needed. Shortness of breath      . BAYER CONTOUR TEST test strip USE AS DIRECTED  50 strip  2  . benazepril (LOTENSIN) 20 MG tablet Take 1 tablet (20 mg total) by mouth 2 (two) times daily.  60 tablet  11  . colchicine 0.6 MG tablet Take 0.6 mg by mouth daily as needed. gout      . digoxin (LANOXIN) 0.125 MG tablet Take 125 mcg by mouth daily.      [name] diltiazem (CARDIZEM CD) 240 MG 24 hr capsule Take 240 mg by mouth daily.        . furosemide (LASIX) 40 MG tablet TAKE 1 TABLET EVERY DAY  30 tablet  4  . glimepiride (AMARYL) 4 MG tablet Take 4 mg by mouth daily before breakfast.      . loperamide (IMODIUM) 2 MG capsule Take 2 mg by mouth 4 (four) times daily as needed. Loose stools      . meloxicam (MOBIC) 15 MG tablet Take 15 mg by mouth daily.      [name] MICROLET LANCETS MISC by Does not apply route.       [name] oxyCODONE-acetaminophen (PERCOCET) 5-325 MG per tablet Take 1 tablet by mouth every 4 (four) hours as needed. pain       . promethazine (PHENERGAN) 25 MG tablet Take 1 tablet (25 mg total) by mouth every 6 (six) hours as needed. Only takes for severe nausea  60 tablet  3  . traMADol (ULTRAM) 50 MG tablet Take 50 mg by mouth every 6 (six) hours as needed. For pain      . warfarin (COUMADIN) 5 MG tablet Take by mouth as directed. Take 5 mg on Sunday & Thursday; take 2.5 mg [0.5 tab] all other days of  week.        Allergies  Allergen Reactions  . Morphine     REACTION: Reaction not  Makes you feel bad all over  . Nifedipine     REACTION: hives and itching  . Piroxicam     REACTION: itching and hives    Past Medical History  Diagnosis Date  . Allergic rhinitis   . Asthma   . Gout   . Hypertension   . Peptic ulcer disease   . History of PSVT (paroxysmal supraventricular tachycardia)   . Colon cancer 2007    s/p surgery and chemo  . Hx of colonoscopy   . HLD (hyperlipidemia)   . DJD (degenerative joint disease)   . History of blood clots     right lung in early 90's  . Atrial fibrillation     pt hasn't been on Coumadin;cardiologist is  Dr.Destynee Stringfellow  . Sleep apnea     doesn't use CPAP  . Shortness of breath     pt states that she can  be sitting as well as exertion and get short of breath  . Bronchitis     last time a year ago  . History of migraine headaches     last migraine 87yrs ago;Pt states she does get sinus headaches  . Fibromyalgia   . Osteoarthritis   . Chronic back pain     hx buldging disc  . Rash     on neck and started after she started taking her beta blocker 23yrs ago;medical MD has seen this  . GERD (gastroesophageal reflux disease)     uses Rolaids prn  . Gastric ulcer   . Hemorrhoids   . Family hx of colon cancer   . Colon cancer   . Diabetes mellitus     glimepiride  . Anxiety     and panic attacks;pt states that she is claustrophobic  . Yeast infection of the vagina     being treated for this now by dr.fontaine  . Spinal headache     after 2nd child  . Bronchitis, acute 03-01-11    FINSHED Z-PAK-STILL ON PREDNISONE NOW    Past Surgical History  Procedure Date  . Caesarean section 1966/69/72    x 3  . Heel spur surgery 1992    x 2  . Ankle surgery     tumor-benign removed left ankle  . Lumbar disc surgery 2008  . Colectomy 2007    colon cancer  . Pyelonidal cystectomy     at age 3  . Knee arthrosocpy     bil;couple of years  before knee replacement  . Ganglion cyst excision > 36yrs ago    removed from left pointer finger  . Cataract surgery 2010  . Tubal ligation 1972  . Cardiac catheterization 90's and 2005  . Port a cath placed 2008  . Port a cath removed 2008  . Knee surgery     left TKA  . Anterior cervical decomp/discectomy fusion 01/12/2011    Procedure: ANTERIOR CERVICAL DECOMPRESSION/DISCECTOMY FUSION 2 LEVELS;  Surgeon: Kathaleen Maser Pool;  Location: [facility];  Service: Neurosurgery;  Laterality: Bilateral;  Cervical five-six, six-seven anterior cervical discectomy and fusion with allograft and plating  . Hysteroscopy w/d&c 02/19/2011    Procedure: DILATATION AND CURETTAGE /HYSTEROSCOPY;  Surgeon: Dara Lords, MD;  Location: [facility];  Service: Gynecology;  Laterality: N/A;  requests one hour  . Dilation and curettage of uterus 02-19-11    & POLYP REMOVAL    Family History  Problem Relation Age of Onset  . Coronary artery disease Other   . Heart attack Mother   . Stroke Mother   . Hypertension Mother   . Heart disease Father   . Hypertension Sister   . Cancer Sister     left kidney cancer  . Cancer Brother     bladder  . Cancer Brother     liver, spread to colon  . Liver cancer Brother   . Anesthesia problems Neg Hx   . Hypotension Neg Hx   . Malignant hyperthermia Neg Hx   . Pseudochol deficiency Neg Hx     History   Social History  . Marital Status: Married    Spouse Name: N/A    Number of Children: N/A  . Years of Education: N/A   Occupational History  . retired    Social History Main Topics  . Smoking status: Former Smoker -- 1.5 packs/day for 30 years    Types: Cigarettes  . Smokeless tobacco: Never Used  Comment: quit 19 years ago  . Alcohol Use: No  . Drug Use: No  . Sexually Active: Yes -- Female partner(s)    Birth Control/ Protection: Post-menopausal   Other Topics Concern  . Not on file   Social History Narrative   Regular exercise- no.     ROS: Please see the HPI.  All other systems reviewed and negative.  PHYSICAL EXAM:  BP 140/90  Pulse 80  Ht 5\' 3"  (1.6 m)  Wt 254 lb 1.9 oz (115.268 kg)  BMI 45.02 kg/m2  LMP 12/30/2000  General: Well developed, well nourished, in no acute distress. Head:  Normocephalic and atraumatic. Neck: no JVD Lungs: Clear to auscultation and percussion. Heart: Normal S1 and S2. SEM. No DM.   Abdomen:  Normal bowel sounds; soft; non tender; no organomegaly Pulses: Pulses normal in all 4 extremities. Extremities: No clubbing or cyanosis. No edema. Neurologic: Alert and oriented x 3.  EKG:  Had Stress test.  Strips reviewed.    NUCLEAR STUDY  (04/30/2011)  Impression  Exercise Capacity: Lexiscan with no exercise.  BP Response: Normal blood pressure response.  Clinical Symptoms: There is dyspnea.  ECG Impression: No significant ST segment change suggestive of ischemia.  Comparison with Prior Nuclear Study: No images to compare  Overall Impression: Normal stress nuclear study.  LV Ejection Fraction: 70%. LV Wall Motion: NL LV Function; NL Wall Motion  Charlton Haws      ASSESSMENT AND PLAN:

## 2011-05-18 NOTE — Assessment & Plan Note (Signed)
Had negative lexiscan.  No ischemia.  Continue to follow.  She is scheduled for several orthopedic procedures.  Based on her data, there are not cardiac limitations.  However, she has multiple concomitant issues, including neck issues on meloxicam, and should be reviewed carefully by any operating surgeon.

## 2011-05-18 NOTE — Assessment & Plan Note (Signed)
NSR at recent lexiscan.   Seems to be less frequent.  Off of dig.  Currently on dilt.  Also takes warfarin.  Can be off five to six days prior to surgery, and should have INR morning of procedure.  I will see back within three months of surgery, and the surgeons should decide when she can resume.  She understands risk of CVA is increased while off of therapy for any surgery.

## 2011-05-18 NOTE — Assessment & Plan Note (Signed)
Seeing Dr. Dutch Quint.

## 2011-05-19 ENCOUNTER — Other Ambulatory Visit: Payer: Self-pay | Admitting: Neurosurgery

## 2011-05-19 DIAGNOSIS — M47812 Spondylosis without myelopathy or radiculopathy, cervical region: Secondary | ICD-10-CM | POA: Diagnosis not present

## 2011-05-19 DIAGNOSIS — M542 Cervicalgia: Secondary | ICD-10-CM

## 2011-05-20 ENCOUNTER — Ambulatory Visit
Admission: RE | Admit: 2011-05-20 | Discharge: 2011-05-20 | Disposition: A | Discharge status: Home / Self Care | Payer: Medicare Other | Source: Ambulatory Visit | Attending: Neurosurgery | Admitting: Neurosurgery

## 2011-05-20 DIAGNOSIS — M542 Cervicalgia: Secondary | ICD-10-CM

## 2011-05-20 DIAGNOSIS — M5020 Other cervical disc displacement, unspecified cervical region: Secondary | ICD-10-CM | POA: Diagnosis not present

## 2011-05-20 DIAGNOSIS — R209 Unspecified disturbances of skin sensation: Secondary | ICD-10-CM | POA: Diagnosis not present

## 2011-05-20 DIAGNOSIS — R5383 Other fatigue: Secondary | ICD-10-CM | POA: Diagnosis not present

## 2011-05-20 DIAGNOSIS — R5381 Other malaise: Secondary | ICD-10-CM | POA: Diagnosis not present

## 2011-05-26 ENCOUNTER — Encounter: Payer: Self-pay | Admitting: Internal Medicine

## 2011-05-26 ENCOUNTER — Ambulatory Visit (INDEPENDENT_AMBULATORY_CARE_PROVIDER_SITE_OTHER): Payer: Medicare Other | Admitting: Internal Medicine

## 2011-05-26 ENCOUNTER — Other Ambulatory Visit (HOSPITAL_COMMUNITY): Payer: Medicare Other

## 2011-05-26 VITALS — BP 122/76 | HR 70 | Temp 97.4°F | Resp 18 | Wt 251.5 lb

## 2011-05-26 DIAGNOSIS — IMO0002 Reserved for concepts with insufficient information to code with codable children: Secondary | ICD-10-CM | POA: Diagnosis not present

## 2011-05-26 DIAGNOSIS — J45909 Unspecified asthma, uncomplicated: Secondary | ICD-10-CM | POA: Diagnosis not present

## 2011-05-26 MED ORDER — OXYCODONE-ACETAMINOPHEN 5-325 MG PO TABS: 1.0000 | ORAL_TABLET | ORAL | Status: DC | PRN

## 2011-05-26 MED ORDER — PROMETHAZINE-CODEINE 6.25-10 MG/5ML PO SYRP: 5.0000 mL | ORAL_SOLUTION | ORAL | Status: AC | PRN

## 2011-05-26 MED ORDER — PREDNISONE 10 MG PO TABS: 10.0000 mg | ORAL_TABLET | Freq: Every day | ORAL | Status: DC

## 2011-05-26 MED ORDER — OXYCODONE HCL 20 MG PO TB12: 20.0000 mg | ORAL_TABLET | Freq: Two times a day (BID) | ORAL | Status: DC

## 2011-05-26 MED ORDER — AMOXICILLIN-POT CLAVULANATE 875-125 MG PO TABS: 1.0000 | ORAL_TABLET | Freq: Two times a day (BID) | ORAL | Status: DC

## 2011-05-26 NOTE — Assessment & Plan Note (Signed)
Now with pain and radiation to arms. MRI with new disc disease C4-5   Plan - start oxycontin 20 mg bid ( she is currently taking 30 mg per 24 hours of oxycodone)           Lyrica 75 mg twice a day           May continue percocet 5/325 for break through pain.           Cautioned about sedation.

## 2011-05-26 NOTE — Patient Instructions (Signed)
Exacerbation of asthma with Upper respiratory infection. Plan - take Augmentin 875 mg twice a day for 7 days. Take sudafed 30 mg three times a day for congestion. Prednisone burst and taper for increased wheezing.  Neck pain with pinched nerve symptoms: take oxycontin 20 mg every twelve (12) hours; take lyrica 75 mg twice a day. You may take percocet  5/325 every 4 hours as needed for break through pain. BE VERY CAREFULL ABOUT GETTING OVER SEDATED USING NARCOTICS. Take a stool softener laxative every day, e.g. sennakot S. Keep appointment with Dr. Dutch Quint.

## 2011-05-26 NOTE — Assessment & Plan Note (Signed)
Exacerbation from URI.  Plan - augmentin 875 bid x 7            Prednisone 10 mg burst and taper           Continue cough syrup.

## 2011-05-26 NOTE — Progress Notes (Signed)
Subjective:    Patient ID: [name], female    DOB: 1940-02-25, 71 y.o.   MRN: [id number]  HPI Mrs. Houseworth presents for sinus congestion, drainage and wheezing alon with cough. She has been using her inhaler several times a day.  She has had C5-C7 ACDF. She is now having excruciating pain in the neck with radiation down both arms. MRI 3/28 with disc bulging C4-5 with bilateral foraminal narrowing and spinal canal narrowing. She has been taking oxy/APAP 5/325 every 4 hours with suboptimal control. She is wearing a neck brace. She will be seeing Dr. Dutch Quint April 10th.  Past Medical History  Diagnosis Date  . Allergic rhinitis   . Asthma   . Gout   . Hypertension   . Peptic ulcer disease   . History of PSVT (paroxysmal supraventricular tachycardia)   . Colon cancer 2007    s/p surgery and chemo  . Hx of colonoscopy   . HLD (hyperlipidemia)   . DJD (degenerative joint disease)   . History of blood clots     right lung in early 90's  . Atrial fibrillation     pt hasn't been on Coumadin;cardiologist is  Dr.Stuckey  . Sleep apnea     doesn't use CPAP  . Shortness of breath     pt states that she can be sitting as well as exertion and get short of breath  . Bronchitis     last time a year ago  . History of migraine headaches     last migraine 53yrs ago;Pt states she does get sinus headaches  . Fibromyalgia   . Osteoarthritis   . Chronic back pain     hx buldging disc  . Rash     on neck and started after she started taking her beta blocker 30yrs ago;medical MD has seen this  . GERD (gastroesophageal reflux disease)     uses Rolaids prn  . Gastric ulcer   . Hemorrhoids   . Family hx of colon cancer   . Colon cancer   . Diabetes mellitus     glimepiride  . Anxiety     and panic attacks;pt states that she is claustrophobic  . Yeast infection of the vagina     being treated for this now by dr.fontaine  . Spinal headache     after 2nd child  . Bronchitis, acute 03-01-11   FINSHED Z-PAK-STILL ON PREDNISONE NOW   Past Surgical History  Procedure Date  . Caesarean section 1966/69/72    x 3  . Heel spur surgery 1992    x 2  . Ankle surgery     tumor-benign removed left ankle  . Lumbar disc surgery 2008  . Colectomy 2007    colon cancer  . Pyelonidal cystectomy     at age 74  . Knee arthrosocpy     bil;couple of years before knee replacement  . Ganglion cyst excision > 26yrs ago    removed from left pointer finger  . Cataract surgery 2010  . Tubal ligation 1972  . Cardiac catheterization 90's and 2005  . Port a cath placed 2008  . Port a cath removed 2008  . Knee surgery     left TKA  . Anterior cervical decomp/discectomy fusion 01/12/2011    Procedure: ANTERIOR CERVICAL DECOMPRESSION/DISCECTOMY FUSION 2 LEVELS;  Surgeon: Kathaleen Maser Pool;  Location: [facility];  Service: Neurosurgery;  Laterality: Bilateral;  Cervical five-six, six-seven anterior cervical discectomy and fusion with allograft and  plating  . Hysteroscopy w/d&c 02/19/2011    Procedure: DILATATION AND CURETTAGE /HYSTEROSCOPY;  Surgeon: Dara Lords, MD;  Location: [facility];  Service: Gynecology;  Laterality: N/A;  requests one hour  . Dilation and curettage of uterus 02-19-11    & POLYP REMOVAL   Family History  Problem Relation Age of Onset  . Coronary artery disease Other   . Heart attack Mother   . Stroke Mother   . Hypertension Mother   . Heart disease Father   . Hypertension Sister   . Cancer Sister     left kidney cancer  . Cancer Brother     bladder  . Cancer Brother     liver, spread to colon  . Liver cancer Brother   . Anesthesia problems Neg Hx   . Hypotension Neg Hx   . Malignant hyperthermia Neg Hx   . Pseudochol deficiency Neg Hx    History   Social History  . Marital Status: Married    Spouse Name: N/A    Number of Children: N/A  . Years of Education: N/A   Occupational History  . retired    Social History Main Topics  . Smoking status: Former  Smoker -- 1.5 packs/day for 30 years    Types: Cigarettes  . Smokeless tobacco: Never Used   Comment: quit 19 years ago  . Alcohol Use: No  . Drug Use: No  . Sexually Active: Yes -- Female partner(s)    Birth Control/ Protection: Post-menopausal   Other Topics Concern  . Not on file   Social History Narrative   Regular exercise- no.   Current Outpatient Prescriptions on File Prior to Visit  Medication Sig Dispense Refill  . albuterol (PROVENTIL HFA;VENTOLIN HFA) 108 (90 BASE) MCG/ACT inhaler Inhale 2 puffs into the lungs every 6 (six) hours as needed. Shortness of breath      . BAYER CONTOUR TEST test strip USE AS DIRECTED  50 strip  2  . benazepril (LOTENSIN) 20 MG tablet Take 1 tablet (20 mg total) by mouth 2 (two) times daily.  60 tablet  11  . colchicine 0.6 MG tablet Take 0.6 mg by mouth daily as needed. gout      . diltiazem (CARDIZEM CD) 240 MG 24 hr capsule Take 240 mg by mouth daily.        . furosemide (LASIX) 40 MG tablet TAKE 1 TABLET EVERY DAY  30 tablet  4  . glimepiride (AMARYL) 4 MG tablet Take 4 mg by mouth daily before breakfast.      . loperamide (IMODIUM) 2 MG capsule Take 2 mg by mouth 4 (four) times daily as needed. Loose stools      . MICROLET LANCETS MISC by Does not apply route.       . promethazine (PHENERGAN) 25 MG tablet Take 1 tablet (25 mg total) by mouth every 6 (six) hours as needed. Only takes for severe nausea  60 tablet  3  . traMADol (ULTRAM) 50 MG tablet Take 50 mg by mouth every 6 (six) hours as needed. For pain      . warfarin (COUMADIN) 5 MG tablet Take by mouth as directed. Take 5 mg on Sunday & Thursday; take 2.5 mg [0.5 tab] all other days of week.      . meloxicam (MOBIC) 15 MG tablet Take 15 mg by mouth daily.      [name] DISCONTD: digoxin (LANOXIN) 0.125 MG tablet Take 125 mcg by mouth daily.  Review of Systems System review is negative for any constitutional, cardiac, pulmonary, GI or neuro symptoms or complaints other than as  described in the HPI.     Objective:   Physical Exam Filed Vitals:   05/26/11 1542  BP: 122/76  Pulse: 70  Temp: 97.4 F (36.3 C)  Resp: 18   Gen'l- overweight white woman in distress from pain in the neck and arms. Mild increased work of breathing. HEENT- mild sinus tenderness to percussion Nekc- in a brace Chest - expiratory wheezing that is diffuse Cor - RRR       Assessment & Plan:

## 2011-05-31 ENCOUNTER — Ambulatory Visit (INDEPENDENT_AMBULATORY_CARE_PROVIDER_SITE_OTHER): Payer: Medicare Other | Admitting: Pharmacist

## 2011-05-31 DIAGNOSIS — Z7901 Long term (current) use of anticoagulants: Secondary | ICD-10-CM | POA: Diagnosis not present

## 2011-05-31 DIAGNOSIS — I4891 Unspecified atrial fibrillation: Secondary | ICD-10-CM | POA: Diagnosis not present

## 2011-06-02 DIAGNOSIS — M4802 Spinal stenosis, cervical region: Secondary | ICD-10-CM | POA: Diagnosis not present

## 2011-06-02 DIAGNOSIS — M47812 Spondylosis without myelopathy or radiculopathy, cervical region: Secondary | ICD-10-CM | POA: Diagnosis not present

## 2011-06-02 DIAGNOSIS — M542 Cervicalgia: Secondary | ICD-10-CM | POA: Diagnosis not present

## 2011-06-04 ENCOUNTER — Encounter (HOSPITAL_COMMUNITY): Payer: Self-pay

## 2011-06-07 ENCOUNTER — Encounter (HOSPITAL_COMMUNITY): Payer: Self-pay | Admitting: *Deleted

## 2011-06-07 ENCOUNTER — Other Ambulatory Visit: Payer: Self-pay

## 2011-06-07 ENCOUNTER — Observation Stay (HOSPITAL_COMMUNITY)
Admission: EM | Admit: 2011-06-07 | Discharge: 2011-06-08 | Disposition: A | Discharge status: Home / Self Care | Payer: Medicare Other | Attending: Internal Medicine | Admitting: Internal Medicine

## 2011-06-07 DIAGNOSIS — Z85038 Personal history of other malignant neoplasm of large intestine: Secondary | ICD-10-CM | POA: Diagnosis not present

## 2011-06-07 DIAGNOSIS — G8929 Other chronic pain: Secondary | ICD-10-CM | POA: Diagnosis not present

## 2011-06-07 DIAGNOSIS — R0602 Shortness of breath: Secondary | ICD-10-CM | POA: Insufficient documentation

## 2011-06-07 DIAGNOSIS — E785 Hyperlipidemia, unspecified: Secondary | ICD-10-CM | POA: Diagnosis not present

## 2011-06-07 DIAGNOSIS — R079 Chest pain, unspecified: Secondary | ICD-10-CM | POA: Diagnosis not present

## 2011-06-07 DIAGNOSIS — E119 Type 2 diabetes mellitus without complications: Secondary | ICD-10-CM | POA: Insufficient documentation

## 2011-06-07 DIAGNOSIS — I10 Essential (primary) hypertension: Secondary | ICD-10-CM | POA: Diagnosis not present

## 2011-06-07 DIAGNOSIS — M542 Cervicalgia: Secondary | ICD-10-CM | POA: Insufficient documentation

## 2011-06-07 DIAGNOSIS — R42 Dizziness and giddiness: Secondary | ICD-10-CM | POA: Diagnosis not present

## 2011-06-07 DIAGNOSIS — G4733 Obstructive sleep apnea (adult) (pediatric): Secondary | ICD-10-CM | POA: Diagnosis not present

## 2011-06-07 DIAGNOSIS — R0789 Other chest pain: Secondary | ICD-10-CM | POA: Diagnosis not present

## 2011-06-07 DIAGNOSIS — I4891 Unspecified atrial fibrillation: Principal | ICD-10-CM | POA: Insufficient documentation

## 2011-06-07 LAB — COMPREHENSIVE METABOLIC PANEL
ALT: 18 U/L (ref 0–35)
Albumin: 3.7 g/dL (ref 3.5–5.2)
BUN: 12 mg/dL (ref 6–23)
Calcium: 9.8 mg/dL (ref 8.4–10.5)
GFR calc Af Amer: >90 mL/min (ref >90)
Glucose, Bld: 227 mg/dL — ABNORMAL HIGH (ref 70–99)
Sodium: 138 mEq/L (ref 135–145)
Total Protein: 7.1 g/dL (ref 6.0–8.3)

## 2011-06-07 LAB — CBC
Hemoglobin: 15.1 g/dL — ABNORMAL HIGH (ref 12.0–15.0)
MCH: 28.5 pg (ref 26.0–34.0)
MCHC: 33.4 g/dL (ref 30.0–36.0)
RDW: 14 % (ref 11.5–15.5)

## 2011-06-07 LAB — DIFFERENTIAL
Lymphocytes Relative: 31 % (ref 12–46)
Lymphs Abs: 3.2 10*3/uL (ref 0.7–4.0)
Neutrophils Relative %: 61 % (ref 43–77)

## 2011-06-07 LAB — CK TOTAL AND CKMB (NOT AT ARMC)
CK, MB: 3.4 ng/mL (ref 0.3–4.0)
Relative Index: 2.7 — ABNORMAL HIGH (ref 0.0–2.5)

## 2011-06-07 LAB — GLUCOSE, CAPILLARY: Glucose-Capillary: 250 mg/dL — ABNORMAL HIGH (ref 70–99)

## 2011-06-07 LAB — PROTIME-INR
INR: 0.94 (ref 0.00–1.49)
Prothrombin Time: 12.8 seconds (ref 11.6–15.2)

## 2011-06-07 MED ORDER — OXYCODONE-ACETAMINOPHEN 5-325 MG PO TABS
1.0000 | ORAL_TABLET | ORAL | Status: DC | PRN
  Administered 2011-06-08 (×3): 1 via ORAL
  Filled 2011-06-07 (×3): qty 1

## 2011-06-07 MED ORDER — BENAZEPRIL HCL 20 MG PO TABS
20.0000 mg | ORAL_TABLET | Freq: Two times a day (BID) | ORAL | Status: DC
  Administered 2011-06-08 (×2): 20 mg via ORAL
  Filled 2011-06-07 (×3): qty 1

## 2011-06-07 MED ORDER — ALPRAZOLAM 0.25 MG PO TABS: 0.2500 mg | ORAL_TABLET | Freq: Two times a day (BID) | ORAL | Status: DC | PRN

## 2011-06-07 MED ORDER — GLIMEPIRIDE 4 MG PO TABS
4.0000 mg | ORAL_TABLET | Freq: Every day | ORAL | Status: DC
  Administered 2011-06-08: 4 mg via ORAL
  Filled 2011-06-07 (×2): qty 1

## 2011-06-07 MED ORDER — OXYCODONE-ACETAMINOPHEN 5-325 MG PO TABS
1.0000 | ORAL_TABLET | Freq: Once | ORAL | Status: AC
  Administered 2011-06-07: 1 via ORAL
  Filled 2011-06-07: qty 1

## 2011-06-07 MED ORDER — ALBUTEROL SULFATE (5 MG/ML) 0.5% IN NEBU
5.0000 mg | INHALATION_SOLUTION | Freq: Once | RESPIRATORY_TRACT | Status: AC
  Administered 2011-06-07: 5 mg via RESPIRATORY_TRACT
  Filled 2011-06-07: qty 1

## 2011-06-07 MED ORDER — ALBUTEROL SULFATE (5 MG/ML) 0.5% IN NEBU: 5.0000 mg | INHALATION_SOLUTION | Freq: Once | RESPIRATORY_TRACT | Status: DC

## 2011-06-07 MED ORDER — COLCHICINE 0.6 MG PO TABS
0.6000 mg | ORAL_TABLET | Freq: Every day | ORAL | Status: DC | PRN
  Filled 2011-06-07: qty 1

## 2011-06-07 MED ORDER — IPRATROPIUM BROMIDE 0.02 % IN SOLN: 0.5000 mg | Freq: Once | RESPIRATORY_TRACT | Status: DC

## 2011-06-07 MED ORDER — LOPERAMIDE HCL 2 MG PO CAPS: 2.0000 mg | ORAL_CAPSULE | Freq: Four times a day (QID) | ORAL | Status: DC | PRN

## 2011-06-07 MED ORDER — FUROSEMIDE 20 MG PO TABS
20.0000 mg | ORAL_TABLET | Freq: Every day | ORAL | Status: DC
  Administered 2011-06-08: 20 mg via ORAL
  Filled 2011-06-07: qty 1

## 2011-06-07 MED ORDER — DILTIAZEM HCL 100 MG IV SOLR
5.0000 mg/h | INTRAVENOUS | Status: DC
  Administered 2011-06-07: 15 mg/h via INTRAVENOUS
  Filled 2011-06-07: qty 100

## 2011-06-07 MED ORDER — PROMETHAZINE HCL 25 MG PO TABS: 25.0000 mg | ORAL_TABLET | Freq: Four times a day (QID) | ORAL | Status: DC | PRN

## 2011-06-07 MED ORDER — IPRATROPIUM BROMIDE 0.02 % IN SOLN
0.5000 mg | Freq: Once | RESPIRATORY_TRACT | Status: AC
  Administered 2011-06-07: 0.5 mg via RESPIRATORY_TRACT
  Filled 2011-06-07: qty 2.5

## 2011-06-07 MED ORDER — LORAZEPAM 1 MG PO TABS: 1.0000 mg | ORAL_TABLET | Freq: Once | ORAL | Status: DC

## 2011-06-07 MED ORDER — ALBUTEROL SULFATE (5 MG/ML) 0.5% IN NEBU: 2.5000 mg | INHALATION_SOLUTION | RESPIRATORY_TRACT | Status: DC | PRN

## 2011-06-07 MED ORDER — DOCUSATE SODIUM 100 MG PO CAPS: 100.0000 mg | ORAL_CAPSULE | Freq: Two times a day (BID) | ORAL | Status: DC | PRN

## 2011-06-07 MED ORDER — ALBUTEROL SULFATE HFA 108 (90 BASE) MCG/ACT IN AERS
2.0000 | INHALATION_SPRAY | Freq: Four times a day (QID) | RESPIRATORY_TRACT | Status: DC | PRN
  Filled 2011-06-07: qty 6.7

## 2011-06-07 MED ORDER — IPRATROPIUM BROMIDE 0.02 % IN SOLN: 0.5000 mg | RESPIRATORY_TRACT | Status: DC | PRN

## 2011-06-07 MED ORDER — DILTIAZEM HCL ER COATED BEADS 240 MG PO CP24
240.0000 mg | ORAL_CAPSULE | Freq: Every day | ORAL | Status: DC
  Administered 2011-06-08: 240 mg via ORAL
  Filled 2011-06-07: qty 1

## 2011-06-07 NOTE — ED Notes (Signed)
PER A Crawford NP - give 10 mg bolus of Cardizem IV

## 2011-06-07 NOTE — ED Notes (Signed)
Cardizem gtt decreased  To  5/hr per dr Mike Gip.

## 2011-06-07 NOTE — ED Notes (Signed)
The pt was in the bed approx 1430 today and when she turned over in the bed she had a rapid heart rate with some chest pain ans sob with dizziness.  She has a history of af ans she thinks that is the rhy she is in now.  She was taken off coumadin last Wednesday because she is due for surgery this Friday.

## 2011-06-07 NOTE — ED Notes (Signed)
PT reports she has a surgery on this Friday 06-11-11 for repair of ruptured cervical disk . Pt has been off coumadin 1 week.

## 2011-06-07 NOTE — ED Notes (Signed)
This patient has a heart rate of 155.  Nurse Magda Paganini notifed.

## 2011-06-07 NOTE — ED Notes (Signed)
2031-01 Ready

## 2011-06-07 NOTE — ED Provider Notes (Signed)
History     CSN: [id number]  Arrival date & time 06/07/11  1530   None     Chief Complaint  Patient presents with  . Chest Pain    (Consider location/radiation/quality/duration/timing/severity/associated sxs/prior treatment) Patient is a 71 y.o. female presenting with chest pain. The history is provided by the patient. No language interpreter was used.  Chest Pain The chest pain began 1 - 2 hours ago. Duration of episode(s) is 1 hour. Chest pain occurs constantly. The chest pain is unchanged. The pain is associated with exertion. At its most intense, the pain is at 10/10. The pain is currently at 1/10. The severity of the pain is mild. The quality of the pain is described as aching. The pain does not radiate. Primary symptoms include shortness of breath and dizziness. Pertinent negatives for primary symptoms include no fever, no syncope, no cough, no wheezing, no palpitations, no abdominal pain, no nausea, no vomiting and no altered mental status.  Dizziness does not occur with nausea, vomiting or weakness.   Pertinent negatives for associated symptoms include no numbness and no weakness. She tried aspirin for the symptoms. Risk factors include obesity, lack of exercise and sedentary lifestyle.  Her past medical history is significant for anxiety/panic attacks, arrhythmia, cancer, diabetes, hyperlipidemia and hypertension.  Pertinent negatives for past medical history include no aneurysm, no aortic aneurysm, no aortic dissection, no CAD, no CHF, no DVT, no MI, no pacemaker, no PE, no PVD, no rheumatic fever, no seizures and no thyroid problem.    71 year old female coming in with rapid atrial flutter and chest discomfort. States that around 2:20 she was resting on her right side in the bed and rolled over and her heart rate shot up. pmh of atrial fib.  States that she was on Coumadin until last Wednesday when they stopped it since she did have cervical neck surgery this Friday. She is on by  mouth Cardizem states that she has not missed any doses. States that her blood pressure at the house was 170/130. Blood pressure presently is 140s over 90s. States that she did experience some shortness of breath but not presently she also experienced headache and tightness across her chest. States that the chest pain is worse with exertion. She took no nitroglycerin. She did take the oxycodone for her neck pain. We'll proceed with a Cardizem bolus and drip. Patient took aspirin today.  Past Medical History  Diagnosis Date  . Allergic rhinitis   . Asthma   . Gout   . Hypertension   . Peptic ulcer disease   . History of PSVT (paroxysmal supraventricular tachycardia)   . Colon cancer 2007    s/p surgery and chemo  . Hx of colonoscopy   . HLD (hyperlipidemia)   . DJD (degenerative joint disease)   . History of blood clots     right lung in early 90's  . Atrial fibrillation     pt hasn't been on Coumadin;cardiologist is  Dr.Stuckey  . Sleep apnea     doesn't use CPAP  . Shortness of breath     pt states that she can be sitting as well as exertion and get short of breath  . Bronchitis     last time a year ago  . History of migraine headaches     last migraine 9yrs ago;Pt states she does get sinus headaches  . Fibromyalgia   . Osteoarthritis   . Chronic back pain     hx buldging disc  .  Rash     on neck and started after she started taking her beta blocker 53yrs ago;medical MD has seen this  . GERD (gastroesophageal reflux disease)     uses Rolaids prn  . Gastric ulcer   . Hemorrhoids   . Family hx of colon cancer   . Colon cancer   . Diabetes mellitus     glimepiride  . Anxiety     and panic attacks;pt states that she is claustrophobic  . Yeast infection of the vagina     being treated for this now by dr.fontaine  . Spinal headache     after 2nd child  . Bronchitis, acute 03-01-11    FINSHED Z-PAK-STILL ON PREDNISONE NOW    Past Surgical History  Procedure Date  .  Caesarean section 1966/69/72    x 3  . Heel spur surgery 1992    x 2  . Ankle surgery     tumor-benign removed left ankle  . Lumbar disc surgery 2008  . Colectomy 2007    colon cancer  . Pyelonidal cystectomy     at age 62  . Knee arthrosocpy     bil;couple of years before knee replacement  . Ganglion cyst excision > 36yrs ago    removed from left pointer finger  . Cataract surgery 2010  . Tubal ligation 1972  . Cardiac catheterization 90's and 2005  . Port a cath placed 2008  . Port a cath removed 2008  . Knee surgery     left TKA  . Anterior cervical decomp/discectomy fusion 01/12/2011    Procedure: ANTERIOR CERVICAL DECOMPRESSION/DISCECTOMY FUSION 2 LEVELS;  Surgeon: Kathaleen Maser Pool;  Location: [facility];  Service: Neurosurgery;  Laterality: Bilateral;  Cervical five-six, six-seven anterior cervical discectomy and fusion with allograft and plating  . Hysteroscopy w/d&c 02/19/2011    Procedure: DILATATION AND CURETTAGE /HYSTEROSCOPY;  Surgeon: Dara Lords, MD;  Location: [facility];  Service: Gynecology;  Laterality: N/A;  requests one hour  . Dilation and curettage of uterus 02-19-11    & POLYP REMOVAL    Family History  Problem Relation Age of Onset  . Coronary artery disease Other   . Heart attack Mother   . Stroke Mother   . Hypertension Mother   . Heart disease Father   . Hypertension Sister   . Cancer Sister     left kidney cancer  . Cancer Brother     bladder  . Cancer Brother     liver, spread to colon  . Liver cancer Brother   . Anesthesia problems Neg Hx   . Hypotension Neg Hx   . Malignant hyperthermia Neg Hx   . Pseudochol deficiency Neg Hx     History  Substance Use Topics  . Smoking status: Former Smoker -- 1.5 packs/day for 30 years    Types: Cigarettes  . Smokeless tobacco: Never Used   Comment: quit 19 years ago  . Alcohol Use: No    OB History    Grav Para Term Preterm Abortions TAB SAB Ect Mult Living   3 3        3       Review  of Systems  Constitutional: Negative for fever.  HENT: Positive for neck pain. Negative for ear pain and neck stiffness.   Respiratory: Positive for shortness of breath. Negative for cough and wheezing.   Cardiovascular: Positive for chest pain. Negative for palpitations and syncope.  Gastrointestinal: Negative for nausea, vomiting and abdominal pain.  Genitourinary: Negative for dysuria.  Musculoskeletal: Negative for back pain and gait problem.  Neurological: Positive for dizziness and headaches. Negative for seizures, weakness and numbness.  Psychiatric/Behavioral: Negative.  Negative for altered mental status.  All other systems reviewed and are negative.    Allergies  Morphine; Nifedipine; and Piroxicam  Home Medications   Current Outpatient Rx  Name Route Sig Dispense Refill  . ALBUTEROL SULFATE HFA 108 (90 BASE) MCG/ACT IN AERS Inhalation Inhale 2 puffs into the lungs every 6 (six) hours as needed. Shortness of breath    . ASPIRIN 325 MG PO TABS Oral Take 325 mg by mouth daily.    [name] BENAZEPRIL HCL 20 MG PO TABS Oral Take 1 tablet (20 mg total) by mouth 2 (two) times daily. 60 tablet 11  . COLCHICINE 0.6 MG PO TABS Oral Take 0.6 mg by mouth daily as needed. gout    . DILTIAZEM HCL ER COATED BEADS 240 MG PO CP24 Oral Take 240 mg by mouth daily.      [name] DOCUSATE SODIUM 100 MG PO CAPS Oral Take 100 mg by mouth 2 (two) times daily as needed. For constipation.    . FUROSEMIDE 20 MG PO TABS Oral Take 20 mg by mouth daily.    [name] GLIMEPIRIDE 4 MG PO TABS Oral Take 4 mg by mouth daily before breakfast.    . LOPERAMIDE HCL 2 MG PO CAPS Oral Take 2 mg by mouth 4 (four) times daily as needed. Loose stools    . OXYCODONE-ACETAMINOPHEN 5-325 MG PO TABS Oral Take 1 tablet by mouth every 4 (four) hours as needed. pain 90 tablet 0  . PROMETHAZINE HCL 25 MG PO TABS Oral Take 1 tablet (25 mg total) by mouth every 6 (six) hours as needed. Only takes for severe nausea 60 tablet 3  . WARFARIN SODIUM 5  MG PO TABS Oral Take by mouth as directed. Take 5 mg on Sunday & Thursday; take 2.5 mg [0.5 tab] all other days of week.      BP 149/79  Pulse 155  Temp(Src) 98.1 F (36.7 C) (Oral)  Resp 23  SpO2 96%  LMP 12/30/2000  Physical Exam  Nursing note and vitals reviewed. Constitutional: She is oriented to person, place, and time. She appears well-developed and well-nourished.  HENT:  Head: Normocephalic and atraumatic.  Eyes: Conjunctivae and EOM are normal. Pupils are equal, round, and reactive to light.  Neck: Normal range of motion. Neck supple.  Cardiovascular: Normal rate, regular rhythm, normal heart sounds and intact distal pulses.   No murmur heard. Pulmonary/Chest: Effort normal and breath sounds normal. No respiratory distress. She has no wheezes. She has no rales.  Abdominal: Soft. Bowel sounds are normal. She exhibits no distension.  Musculoskeletal: Normal range of motion. She exhibits no edema and no tenderness.  Neurological: She is alert and oriented to person, place, and time. She has normal reflexes.  Skin: Skin is warm and dry.  Psychiatric: She has a normal mood and affect.    ED Course  Procedures (including critical care time)   Labs Reviewed  CBC  DIFFERENTIAL  COMPREHENSIVE METABOLIC PANEL  CK TOTAL AND CKMB  PROTIME-INR   No results found.   No diagnosis found.    MDM  Rapid atrial fib treated in the ER was Cardizem bolus and drip. Troponin negative. Rate is 80 after Cardizem bolus time to and drip at 10 per hour. Dr. Lurline Idol with [facility] will come in to see patient and admit.Labs  Reviewed - No data to display  labs are not pressing over in the computer. CBC C. metastases virtually unremarkable. Glucose 227. Patient is a diabetic. Troponin 0.08. Second one pending.    Date: 06/07/2011  Rate: 156  Rhythm: atrial fibrillation  QRS Axis: normal  Intervals: normal  ST/T Wave abnormalities: normal  Conduction Disutrbances:none  Narrative  Interpretation:   Old EKG Reviewed: changes noted   Labs Reviewed  GLUCOSE, CAPILLARY - Abnormal; Notable for the following:    Glucose-Capillary 250 (*)    All other components within normal limits  BASIC METABOLIC PANEL - Abnormal; Notable for the following:    Glucose, Bld 125 (*)    GFR calc non Af Amer 61 (*)    GFR calc Af Amer 71 (*)    All other components within normal limits  GLUCOSE, CAPILLARY - Abnormal; Notable for the following:    Glucose-Capillary 122 (*)    All other components within normal limits  GLUCOSE, CAPILLARY - Abnormal; Notable for the following:    Glucose-Capillary 156 (*)    All other components within normal limits  POCT I-STAT TROPONIN I  CARDIAC PANEL(CRET KIN+CKTOT+MB+TROPI)  CARDIAC PANEL(CRET KIN+CKTOT+MB+TROPI)  POCT I-STAT TROPONIN I  CARDIAC PANEL(CRET KIN+CKTOT+MB+TROPI)            Remi Haggard, NP 06/08/11 1214

## 2011-06-07 NOTE — ED Notes (Signed)
PER A. Crowford NP rate 10 mg/hour. Cardizem

## 2011-06-07 NOTE — H&P (Signed)
Physician History and Physical    [name] MRN: [id number] DOB/AGE: March 09, 1940 71 y.o. Admit date: 06/07/2011  Primary Care Physician:  Jacques Navy, MD  Primary Cardiologist:  Herby Abraham, MD   CC:  Atrial fibrillation  HPI:  Pt is a 71 yo woman with extensive PMH including paroxysmal AF, HTN, OSA, chronic neck pain with plans for surgery later this week who presents with atrial fibrillation.  Pt reports that she was sick with upper respiratory infection last week and was put on abx.  She stopped taking the abx because they gave her diarrhea.  She has been having prod cough of white/green phlegm that was improving.  No fevers.  She rolled over in bed today and had sudden onset of palpitations, CP, SOB.  She can have CP and SOB with her episodes of AF, but it is relatively uncommon for her.  She has been walking her dogs outside and feels that the pollen and her allergies may be playing a role in her breathing as well.  She has episodes of AF about once every few months.  SHe was sick with a similar bronchitis a couple of months ago.  She took an aspirin today because of the CP that she had, but she has otherwise stopped her coumadin and daily aspirin because she has planned neck surgery on Friday.  She finds it very hard to sleep at night due to her neck pain and is very worried about the AF preventing her from having surgery later this week.  She denies PND, orthopnea, LE edema, syncope.  SHe was started on dilt in ED and given a neb tx and feels better at this time.  She is back in sinus rhythm.  She still feels as if her breathing is "tight" but the chest pain is resolved.    Review of systems: A review of 10 organ systems was done and is negative except as stated above in HPI.  No abd pain, fevers, diarrhea, focal weakness, vision changes.  Past Medical History  Diagnosis Date  . Allergic rhinitis   . Asthma   . Gout   . Hypertension   . Peptic ulcer disease   .  History of PSVT (paroxysmal supraventricular tachycardia)   . Colon cancer 2007    s/p surgery and chemo  . Hx of colonoscopy   . HLD (hyperlipidemia)   . DJD (degenerative joint disease)   . History of blood clots     right lung in early 90's  . Atrial fibrillation     pt hasn't been on Coumadin;cardiologist is  Dr.Stuckey  . Sleep apnea     doesn't use CPAP  . Shortness of breath     pt states that she can be sitting as well as exertion and get short of breath  . Bronchitis     last time a year ago  . History of migraine headaches     last migraine 70yrs ago;Pt states she does get sinus headaches  . Fibromyalgia   . Osteoarthritis   . Chronic back pain     hx buldging disc  . Rash     on neck and started after she started taking her beta blocker 43yrs ago;medical MD has seen this  . GERD (gastroesophageal reflux disease)     uses Rolaids prn  . Gastric ulcer   . Hemorrhoids   . Family hx of colon cancer   . Colon cancer   .  Diabetes mellitus     glimepiride  . Anxiety     and panic attacks;pt states that she is claustrophobic  . Yeast infection of the vagina     being treated for this now by dr.fontaine  . Spinal headache     after 2nd child  . Bronchitis, acute 03-01-11    FINSHED Z-PAK-STILL ON PREDNISONE NOW   Past Surgical History  Procedure Date  . Caesarean section 1966/69/72    x 3  . Heel spur surgery 1992    x 2  . Ankle surgery     tumor-benign removed left ankle  . Lumbar disc surgery 2008  . Colectomy 2007    colon cancer  . Pyelonidal cystectomy     at age 71  . Knee arthrosocpy     bil;couple of years before knee replacement  . Ganglion cyst excision > 23yrs ago    removed from left pointer finger  . Cataract surgery 2010  . Tubal ligation 1972  . Cardiac catheterization 90's and 2005  . Port a cath placed 2008  . Port a cath removed 2008  . Knee surgery     left TKA  . Anterior cervical decomp/discectomy fusion 01/12/2011    Procedure:  ANTERIOR CERVICAL DECOMPRESSION/DISCECTOMY FUSION 2 LEVELS;  Surgeon: Kathaleen Maser Pool;  Location: [facility];  Service: Neurosurgery;  Laterality: Bilateral;  Cervical five-six, six-seven anterior cervical discectomy and fusion with allograft and plating  . Hysteroscopy w/d&c 02/19/2011    Procedure: DILATATION AND CURETTAGE /HYSTEROSCOPY;  Surgeon: Dara Lords, MD;  Location: [facility];  Service: Gynecology;  Laterality: N/A;  requests one hour  . Dilation and curettage of uterus 02-19-11    & POLYP REMOVAL   History   Social History  . Marital Status: Married    Spouse Name: N/A    Number of Children: N/A  . Years of Education: N/A   Occupational History  . retired    Social History Main Topics  . Smoking status: Former Smoker -- 1.5 packs/day for 30 years    Types: Cigarettes  . Smokeless tobacco: Never Used   Comment: quit 19 years ago  . Alcohol Use: No  . Drug Use: No  . Sexually Active: Yes -- Female partner(s)    Birth Control/ Protection: Post-menopausal   Other Topics Concern  . Not on file   Social History Narrative   Regular exercise- no.    Family History  Problem Relation Age of Onset  . Coronary artery disease Other   . Heart attack Mother   . Stroke Mother   . Hypertension Mother   . Heart disease Father   . Hypertension Sister   . Cancer Sister     left kidney cancer  . Cancer Brother     bladder  . Cancer Brother     liver, spread to colon  . Liver cancer Brother   . Anesthesia problems Neg Hx   . Hypotension Neg Hx   . Malignant hyperthermia Neg Hx   . Pseudochol deficiency Neg Hx      Allergies  Allergen Reactions  . Morphine Other (See Comments)    REACTION: Reaction not  Makes you feel bad all over  . Nifedipine Hives and Itching  . Piroxicam Hives and Itching     (Not in a hospital admission)  Current facility-administered medications:albuterol (PROVENTIL) (5 MG/ML) 0.5% nebulizer solution 5 mg, 5 mg, Nebulization, Once, Remi Haggard, NP, 5 mg at 06/07/11 2157;  diltiazem (CARDIZEM) 100 mg in dextrose 5 % 100 mL infusion, 5-15 mg/hr, Intravenous, Titrated, Remi Haggard, NP, Last Rate: 15 mL/hr at 06/07/11 1716, 15 mg/hr at 06/07/11 1716 ipratropium (ATROVENT) nebulizer solution 0.5 mg, 0.5 mg, Nebulization, Once, Remi Haggard, NP, 0.5 mg at 06/07/11 2158;  oxyCODONE-acetaminophen (PERCOCET) 5-325 MG per tablet 1 tablet, 1 tablet, Oral, Once, Remi Haggard, NP, 1 tablet at 06/07/11 2112 Current outpatient prescriptions:albuterol (PROVENTIL HFA;VENTOLIN HFA) 108 (90 BASE) MCG/ACT inhaler, Inhale 2 puffs into the lungs every 6 (six) hours as needed. Shortness of breath, Disp: , Rfl: ;  aspirin 325 MG tablet, Take 325 mg by mouth daily., Disp: , Rfl: ;  benazepril (LOTENSIN) 20 MG tablet, Take 1 tablet (20 mg total) by mouth 2 (two) times daily., Disp: 60 tablet, Rfl: 11 colchicine 0.6 MG tablet, Take 0.6 mg by mouth daily as needed. gout, Disp: , Rfl: ;  diltiazem (CARDIZEM CD) 240 MG 24 hr capsule, Take 240 mg by mouth daily.  , Disp: , Rfl: ;  docusate sodium (COLACE) 100 MG capsule, Take 100 mg by mouth 2 (two) times daily as needed. For constipation., Disp: , Rfl: ;  furosemide (LASIX) 20 MG tablet, Take 20 mg by mouth daily., Disp: , Rfl:  glimepiride (AMARYL) 4 MG tablet, Take 4 mg by mouth daily before breakfast., Disp: , Rfl: ;  loperamide (IMODIUM) 2 MG capsule, Take 2 mg by mouth 4 (four) times daily as needed. Loose stools, Disp: , Rfl: ;  oxyCODONE-acetaminophen (PERCOCET) 5-325 MG per tablet, Take 1 tablet by mouth every 4 (four) hours as needed. pain, Disp: 90 tablet, Rfl: 0 promethazine (PHENERGAN) 25 MG tablet, Take 1 tablet (25 mg total) by mouth every 6 (six) hours as needed. Only takes for severe nausea, Disp: 60 tablet, Rfl: 3;  warfarin (COUMADIN) 5 MG tablet, Take by mouth as directed. Take 5 mg on Sunday & Thursday; take 2.5 mg [0.5 tab] all other days of week., Disp: , Rfl: ;  DISCONTD: digoxin (LANOXIN)  0.125 MG tablet, Take 125 mcg by mouth daily., Disp: , Rfl:   Physical Exam: Blood pressure 123/99, pulse 73, temperature 98.1 F (36.7 C), temperature source Oral, resp. rate 23, last menstrual period 12/30/2000, SpO2 98.00%.; There is no height or weight on file to calculate BMI. Temp:  [98 F (36.7 C)-98.1 F (36.7 C)] 98.1 F (36.7 C) (04/15 1623) Pulse Rate:  [46-155] 73  (04/15 2030) Resp:  [15-23] 23  (04/15 2030) BP: (91-150)/(55-128) 123/99 mmHg (04/15 2030) SpO2:  [96 %-99 %] 98 % (04/15 2030)  No intake or output data in the 24 hours ending 06/07/11 2200 General: NAD Heent: MMM Neck: No JVD  CV: RRR, nl S1/S2, no S3/S4, no murmur, distant HS  Lungs: Clear to auscultation bilaterally with normal respiratory effort, no wheezes, good air movement Abdomen: Obese, soft, nontender, nondistended Extremities: No clubbing or cyanosis.  No pedal edema Skin: Intact without lesions or rashes  Neurologic: Alert and oriented x 3, grossly nonfocal  Psych: Normal mood and affect    Labs:  [facility] 06/07/11 1605  CKTOTAL 124  CKMB 3.4  TROPONINI --   Lab Results  Component Value Date   WBC 10.3 06/07/2011   HGB 15.1* 06/07/2011   HCT 45.2 06/07/2011   MCV 85.4 06/07/2011   PLT 288 06/07/2011    Lab 06/07/11 1605  NA 138  K 3.6  CL 101  CO2 24  BUN 12  CREATININE 0.70  CALCIUM 9.8  PROT 7.1  BILITOT 0.2*  ALKPHOS 99  ALT 18  AST 22  GLUCOSE 227*   Lab Results  Component Value Date   CHOL 212* 04/24/2010   HDL 60.10 04/24/2010   LDLCALC  Value: 132        Total Cholesterol/HDL:CHD Risk Coronary Heart Disease Risk Table                     Men   Women  1/2 Average Risk   3.4   3.3  Average Risk       5.0   4.4  2 X Average Risk   9.6   7.1  3 X Average Risk  23.4   11.0        Use the calculated Patient Ratio above and the CHD Risk Table to determine the patient's CHD Risk.        ATP III CLASSIFICATION (LDL):  <100     mg/dL   Optimal  161-096  mg/dL   Near or Above                     Optimal  130-159  mg/dL   Borderline  045-409  mg/dL   High  >811     mg/dL   Very High* 11/06/7827   TRIG 239.0* 04/24/2010       EKG:  AF with RVR rate 156  Echo: 2012 Study Conclusions  - Left ventricle: The cavity size was normal. Wall thickness was increased in a pattern of mild LVH. Systolic function was normal. The estimated ejection fraction was in the range of 55% to 60%. There was an increased relative contribution of atrial contraction to ventricular filling. - Aortic valve: Mild regurgitation.   Radiology:  Nuclear stress test 3/13 Overall Impression: Normal stress nuclear study.  LV Ejection Fraction: 70%. LV Wall Motion: NL LV Function; NL Wall Motion   ASSESSMENT:  Pt is a 71 yo woman with extensive PMH including paroxysmal AF, HTN, OSA, chronic neck pain with plans for surgery later this week who presents with atrial fibrillation.   PLAN: Atrial fibrillation - resolved.  Pt with hx of parox AF.  She is currently in sinus rhythm.  Likely trigger was recent viral URI/cough and probably with stress from neck pain contributing.  Pt is currently off coumadin for planned surgery Friday.  She understands risk of CVA while being off coumadin.  DC dilt gtt.  Continue home dilt dose.  Monitor on tele.  Chest pain - resolved.  likely 2/2 AF with RVR.  EKG with no acute ischemic changes.  First set of enzymes negative.  Recent stress test negative.  Monitor serial cardiac enzymes, place on tele, EKG in am.  SOB - could be related to AF vs allergies vs asthma.  Improved.  Nebs prn.  Check CXR.  Echo with normal EF 2012  Anxiety - pt asked for meds to relax her.  Xanax prn  Neck pain - continue home pain meds.  Plan for surgery later in the week - advised pt that she tell her surgeons that she took an aspirin at home today in the setting of chest pain - not sure if the surgery will need to be delayed 2/2 the aspirin  DM - continue home meds.  accuchecks  HTN -  continue home meds  Dispo - admit for observation      Signed: Hilary Hertz, MD Cardiology Fellow 06/07/2011, 10:00 PM

## 2011-06-08 ENCOUNTER — Inpatient Hospital Stay (HOSPITAL_COMMUNITY): Payer: Medicare Other

## 2011-06-08 ENCOUNTER — Inpatient Hospital Stay (HOSPITAL_COMMUNITY): Admission: RE | Admit: 2011-06-08 | Discharge: 2011-06-08 | Payer: Medicare Other | Source: Ambulatory Visit

## 2011-06-08 DIAGNOSIS — R0602 Shortness of breath: Secondary | ICD-10-CM | POA: Diagnosis not present

## 2011-06-08 DIAGNOSIS — I4891 Unspecified atrial fibrillation: Secondary | ICD-10-CM | POA: Diagnosis not present

## 2011-06-08 DIAGNOSIS — R079 Chest pain, unspecified: Secondary | ICD-10-CM | POA: Diagnosis present

## 2011-06-08 DIAGNOSIS — J45909 Unspecified asthma, uncomplicated: Secondary | ICD-10-CM | POA: Diagnosis not present

## 2011-06-08 LAB — GLUCOSE, CAPILLARY
Glucose-Capillary: 122 mg/dL — ABNORMAL HIGH (ref 70–99)
Glucose-Capillary: 156 mg/dL — ABNORMAL HIGH (ref 70–99)
Glucose-Capillary: 134 mg/dL — ABNORMAL HIGH (ref 70–99)

## 2011-06-08 LAB — CARDIAC PANEL(CRET KIN+CKTOT+MB+TROPI)
Relative Index: INVALID (ref 0.0–2.5)
Relative Index: INVALID (ref 0.0–2.5)
Total CK: 80 U/L (ref 7–177)
Total CK: 81 U/L (ref 7–177)
Total CK: 89 U/L (ref 7–177)
Troponin I: <0.3 ng/mL (ref <0.30)

## 2011-06-08 LAB — BASIC METABOLIC PANEL
BUN: 16 mg/dL (ref 6–23)
CO2: 26 mEq/L (ref 19–32)
Calcium: 10 mg/dL (ref 8.4–10.5)
Creatinine, Ser: 0.93 mg/dL (ref 0.50–1.10)
Glucose, Bld: 125 mg/dL — ABNORMAL HIGH (ref 70–99)

## 2011-06-08 MED ORDER — ASPIRIN 325 MG PO TABS: 325.0000 mg | ORAL_TABLET | Freq: Every day | ORAL | Status: DC

## 2011-06-08 MED ORDER — WARFARIN SODIUM 5 MG PO TABS: ORAL_TABLET | ORAL | Status: DC

## 2011-06-08 NOTE — Progress Notes (Signed)
Erie Noe @ Dr. Lindalou Hose called re: need for orders.  Noted in EPIC that pt has been admitted.  Erica in PAT notified.//L. Thanvi Blincoe,RN

## 2011-06-08 NOTE — Progress Notes (Signed)
Pt/family given discharge instructions, medication list, follow up appointments, and when to call the doctor. Pt verbalized understanding of instructions. [name]

## 2011-06-08 NOTE — Progress Notes (Signed)
Cardiology Progress Note Patient Name: [name] Date of Encounter: 06/08/2011, 4:30 PM     Subjective  No overnight events. Patient feels well w/o c/o chest pain, palpitations, or chest pain. Very anxious to go home as she has preop eval appt at 8am tomorrow morning for scheduled neck surgery Friday.   Objective   Telemetry: Sinus rhythm 50s-70s  Medications: . albuterol  5 mg Nebulization Once  . benazepril  20 mg Oral BID  . diltiazem  240 mg Oral Daily  . furosemide  20 mg Oral Daily  . glimepiride  4 mg Oral QAC breakfast  . ipratropium  0.5 mg Nebulization Once  . oxyCODONE-acetaminophen  1 tablet Oral Once   . DISCONTD: diltiazem (CARDIZEM) infusion 15 mg/hr (06/07/11 1716)    Physical Exam: Temp:  [97.2 F (36.2 C)-97.9 F (36.6 C)] 97.2 F (36.2 C) (04/16 1325) Pulse Rate:  [46-136] 62  (04/16 1325) Resp:  [15-26] 20  (04/16 1325) BP: (91-147)/(49-128) 144/78 mmHg (04/16 1325) SpO2:  [96 %-100 %] 96 % (04/16 1325) Weight:  [244 lb 11.4 oz (111 kg)] 244 lb 11.4 oz (111 kg) (04/16 0636)  General: Overweight white female, in no acute distress. Head: Normocephalic, atraumatic, sclera non-icteric, nares are without discharge.  Neck: Supple. Negative for carotid bruits or JVD Lungs: Clear bilaterally to auscultation without wheezes, rales, or rhonchi. Breathing is unlabored. Heart: RRR S1 S2. Soft 1-2/6 Systolic murmur RUSB. No rubs or gallops.  Abdomen: Obese, Soft, non-tender, non-distended with normoactive bowel sounds. No rebound/guarding. No obvious abdominal masses. Msk:  Strength and tone appear normal for age. Extremities:Trace BLE edema. No clubbing or cyanosis. Distal pedal pulses are intact and equal bilaterally. Neuro: Alert and oriented X 3. Moves all extremities spontaneously. Psych:  Responds to questions appropriately with a normal affect.   Intake/Output Summary (Last 24 hours) at 06/08/11 1630 Last data filed at 06/08/11 1200  Gross per  24 hour  Intake    480 ml  Output      0 ml  Net    480 ml    Labs:  [facility] 06/08/11 0608 06/07/11 1605  NA 141 138  K 3.8 3.6  CL 102 101  CO2 26 24  GLUCOSE 125* 227*  BUN 16 12  CREATININE 0.93 0.70  CALCIUM 10.0 9.8   Basename 06/07/11 1605  AST 22  ALT 18  ALKPHOS 99  BILITOT 0.2*  PROT 7.1  ALBUMIN 3.7   Basename 06/07/11 1605  WBC 10.3  NEUTROABS 6.2  HGB 15.1*  HCT 45.2  MCV 85.4  PLT 288   Basename 06/08/11 1155 06/08/11 0608 06/07/11 2358 06/07/11 1605  CKTOTAL 80 81 89 124  CKMB 2.6 2.5 2.7 3.4  TROPONINI <0.30 <0.30 <0.30 --   Radiology/Studies:   06/08/2011 - CXR Findings: The lungs are clear.  Mediastinal contours appear normal. Mild cardiomegaly is stable.  The descending thoracic aorta is ectatic.  There are degenerative changes diffusely throughout the thoracic spine.  A lower anterior cervical spine fusion plate is present.  IMPRESSION: Stable cardiomegaly.  No active lung disease.     Assessment and Plan  71 y.o. female w/ PMHx significant for PAF, HTN, OSA, & chronic neck pain w/ plans for surgery later this week who presented to [facility] on 06/07/11 with c/o palpitations, cp and sob and found to be in atrial fibrillation w/ RVR.  1. Atrial Fibrillation w/ RVR: She has h/o PAF with reported  episodes once every few months. It was felt this episode was triggered by recent viral illness and stress. She converted to NSR in the ED on diltiazem drip, which was converted to her home oral dose. She has remained in NSR. She is not on coumadin due to her upcoming neck surgery. Cont diltiazem.  2. Chest pain: Resolved with resolution of a.fib w/ rvr. EKG without acute ischemic changes. Cardiac enzymes cycled and negative. CXR without acute findings. Recent nuclear stress test 3/13 w/ EF 70% and no ischemia. No further cardiac work up warranted at this time.  Disposition: Discuss with MD going home today.  Signed, HOPE, JESSICA PA-C  Patient  seen and examined. I agree with the assessment and plan as detailed above. See also my additional thoughts below.   The patient presented with atrial fibrillation. She has episodes every few months. She converted with IV diltiazem and now her by mouth diltiazem is being continued. It is safe for her to go home. In addition it is safe for her to proceed with her planned cervical disc surgery in the next few days. No further workup is needed. If she were to have recurrent atrial fibrillation around the time of her surgery, IV diltiazem could be used for rate control. It is actually optimal to proceed with her surgery at this time. She is off Coumadin. It will be best to proceed with her surgery and get her back on Coumadin when possible.  Willa Rough, MD, [facility] 06/08/2011 5:03 PM

## 2011-06-08 NOTE — Discharge Summary (Signed)
Discharge Summary   Patient ID: [name] MRN: [id number], DOB/AGE: 1940-10-28 71 y.o.  Primary MD: Illene Regulus, MD Primary Cardiologist: Dr. Riley Kill  Admit date: 06/07/2011 D/C date:     06/08/2011      Primary Discharge Diagnoses:  1. Atrial Fibrillation w/ RVR  - Has h/o PAF, on coumadin and cardizem  - Spontaneously converted in the ED on cardizem drip  - Remained in NSR  - Off coumadin for upcoming surgery (neck surgery this Friday) 2. Chest pain   - EKG without acute ischemic changes. Cardiac enzymes cycled and negative. CXR without acute findings  - Resolved with resolution of a.fib w/ rvr  - Recent nuclear stress test 3/13 w/ EF 70% and no ischemia 3. Chronic Neck Pain  - Scheduled for surgery this Friday 06/11/11   Secondary Discharge Diagnoses:  1. Diabetes mellitus 2. Asthma   3. Gout   4. Hypertension   5. Peptic ulcer disease   6. History of PSVT (paroxysmal supraventricular tachycardia)   7. Colon cancer 2007 s/p surgery and chemo   8. HLD (hyperlipidemia)   9. DJD (degenerative joint disease)   10. History of blood clots right lung in early 90's   11. Sleep apnea doesn't use CPAP  12. Bronchitis  13. History of migraine headaches last migraine 37yrs ago;Pt states she does get sinus headaches   14. Fibromyalgia   15. Osteoarthritis 16. Chronic back pain hx buldging disc   17. Rash on neck and started after she started taking her beta blocker 20yrs ago;medical MD has seen this   18. GERD (gastroesophageal reflux disease) uses Rolaids prn   19. Gastric ulcer   20. Hemorrhoids  21. Allergic rhinitis   22. Anxiety and panic attacks;pt states that she is claustrophobic   23. Yeast infection of the vagina  24. Spinal headache after 2nd child    Allergies Allergies  Allergen Reactions  . Morphine Other (See Comments)    REACTION: Reaction not  Makes you feel bad all over  . Nifedipine Hives and Itching  . Piroxicam Hives and Itching     Diagnostic Studies/Procedures:   06/08/2011 - CXR Findings: The lungs are clear.  Mediastinal contours appear normal. Mild cardiomegaly is stable.  The descending thoracic aorta is ectatic.  There are degenerative changes diffusely throughout the thoracic spine.  A lower anterior cervical spine fusion plate is present.  IMPRESSION: Stable cardiomegaly.  No active lung disease.      History of Present Illness: 71 y.o. female w/ PMHx significant for PAF, HTN, OSA, & chronic neck pain w/ plans for surgery later this week who presented to [facility] on 06/07[facility]11 with c/o palpitations, cp and sob and found to be in atrial fibrillation w/ RVR.  Pt reported that she was sick with upper respiratory infection last week and was put on abx, but stopped the abx because they gave her diarrhea. She had been having prod cough of white/green phlegm that was improving, without fevers. She rolled over in bed on day of presentation and had sudden onset of palpitations, CP, and SOB. She can have CP and SOB with her episodes of AF, but it is relatively uncommon for her. She had been walking her dogs outside and feels that the pollen and her allergies may be playing a role in her breathing as well. She has episodes of AF about once every few months. She took an aspirin on the day of presentation because of the CP, but  she has otherwise stopped her coumadin and daily aspirin because she has planned neck surgery on Friday.    Hospital Course: In the ED, EKG revealed atrial fibrillation w/ RVR, 156bpm, no acute ST/T changes. CXR was without acute cardiopulmonary abnormalities. Labs significant for normal cardiac enzymes. She was started on IV diltiazem in ED with spontaneous conversion to normal sinus rhythm and resolution of chest pain. She was placed back on home oral diltiazem and admitted for observation.  Cardiac enzymes were cycled and remained negative. She had no further chest pain, sob, or palpitations. She  remained in NSR on telemetry. She was kept off of ASA and coumadin for planned neck surgery on Friday 06/11/11. She has a preop evaluation scheduled for 8am tomorrow.   She was seen and evaluated by Dr. Myrtis Ser who felt she was stable for discharge home with plans for follow up as scheduled below.  Discharge Vitals: Blood pressure 144/78, pulse 62, temperature 97.2 F (36.2 C), temperature source Oral, resp. rate 20, height 5\' 4"  (1.626 m), weight 244 lb 11.4 oz (111 kg), last menstrual period 12/30/2000, SpO2 96.00%.  Labs:  WBC 10.3 06/07/2011   HGB 15.1* 06/07/2011   HCT 45.2 06/07/2011   MCV 85.4 06/07/2011   PLT 288 06/07/2011    Lab 06/08/11 0608 06/07/11 1605  NA 141 --  K 3.8 --  CL 102 --  CO2 26 --  BUN 16 --  CREATININE 0.93 --  CALCIUM 10.0 --  PROT -- 7.1  BILITOT -- 0.2*  ALKPHOS -- 99  ALT -- 18  AST -- 22  GLUCOSE 125* --   Basename 06/08/11 1155 06/08/11 0608 06/07/11 2358 06/07/11 1605  CKTOTAL 80 81 89 124  CKMB 2.6 2.5 2.7 3.4  TROPONINI <0.30 <0.30 <0.30 --    Discharge Medications   Medication List  As of 06/08/2011  5:13 PM   TAKE these medications         albuterol 108 (90 BASE) MCG/ACT inhaler   Commonly known as: PROVENTIL HFA;VENTOLIN HFA   Inhale 2 puffs into the lungs every 6 (six) hours as needed. Shortness of breath      aspirin 325 MG tablet   Take 1 tablet (325 mg total) by mouth daily. Discuss holding this medication with your surgeon      benazepril 20 MG tablet   Commonly known as: LOTENSIN   Take 1 tablet (20 mg total) by mouth 2 (two) times daily.      colchicine 0.6 MG tablet   Take 0.6 mg by mouth daily as needed. gout      diltiazem 240 MG 24 hr capsule   Commonly known as: CARDIZEM CD   Take 240 mg by mouth daily.      docusate sodium 100 MG capsule   Commonly known as: COLACE   Take 100 mg by mouth 2 (two) times daily as needed. For constipation.      furosemide 20 MG tablet   Commonly known as: LASIX   Take 20 mg by  mouth daily.      glimepiride 4 MG tablet   Commonly known as: AMARYL   Take 4 mg by mouth daily before breakfast.      loperamide 2 MG capsule   Commonly known as: IMODIUM   Take 2 mg by mouth 4 (four) times daily as needed. Loose stools      oxyCODONE-acetaminophen 5-325 MG per tablet   Commonly known as: PERCOCET   Take 1 tablet by  mouth every 4 (four) hours as needed. pain      promethazine 25 MG tablet   Commonly known as: PHENERGAN   Take 1 tablet (25 mg total) by mouth every 6 (six) hours as needed. Only takes for severe nausea      warfarin 5 MG tablet   Commonly known as: COUMADIN   Discuss holding this medication with your surgeon.  Take 5 mg on Sunday & Thursday; take 2.5 mg [0.5 tab] all other days of week.            Disposition   Discharge Orders    Future Appointments: Provider: Department: Dept Phone: Center:   06/09/2011 9:00 AM [facility]  None   06/14/2011 12:30 PM [facility] [phone] None   08/16/2011 11:15 AM Herby Abraham, MD [facility] [phone] [facility]     Future Orders Please Complete By Expires   Diet - low sodium heart healthy      Increase activity slowly      Discharge instructions      Comments:   **PLEASE REMEMBER TO BRING ALL OF YOUR MEDICATIONS TO EACH OF YOUR FOLLOW-UP OFFICE VISITS.  * Good luck on your upcoming surgery!     Follow-up Information    Follow up with Shawnie Pons, MD. (Our office will call you with appointment information)    Contact information:   [facility] [address] [place] [address] [place] [address] [phone]           Outstanding Labs/Studies:  None  Duration of Discharge Encounter: Greater than 30 minutes including physician and PA time.  Signed, HOPE, JESSICA PA-C 06/08/2011, 5:13 PM  Patient seen and examined. I agree with the assessment and plan as detailed above. See also my additional  thoughts below.   I have seen and examined the patient. I have reviewed all the data with [facility], PA-C. Please see also my progress note from today. The patient is ready to go home. It is safe for her to proceed with her neurosurgical procedure to her neck.  Willa Rough, MD, [facility] 06/08/2011 5:49 PM

## 2011-06-09 ENCOUNTER — Encounter (HOSPITAL_COMMUNITY)
Admit: 2011-06-09 | Discharge: 2011-06-09 | Disposition: A | Discharge status: Home / Self Care | Payer: Medicare Other | Attending: Neurosurgery | Admitting: Neurosurgery

## 2011-06-09 ENCOUNTER — Encounter (HOSPITAL_COMMUNITY): Payer: Self-pay

## 2011-06-09 HISTORY — DX: Frequency of micturition: R35.0

## 2011-06-09 HISTORY — DX: Edema, unspecified: R60.9

## 2011-06-09 HISTORY — DX: Dizziness and giddiness: R42

## 2011-06-09 HISTORY — DX: Nocturia: R35.1

## 2011-06-09 HISTORY — DX: Localized edema: R60.0

## 2011-06-09 NOTE — Pre-Procedure Instructions (Signed)
20 [name]  06/09/2011   Your procedure is scheduled on:  Fri, April 19 @ 2:35pm  Report to [facility] at 1230 PM.  Call this number if you have problems the morning of surgery: [phone]   Remember:   Do not eat food:After Midnight.  May have clear liquids: up to 4 Hours before arrival.(until 8:30 AM)  Clear liquids include soda, tea, black coffee, apple or grape juice, broth,water  Take these medicines the morning of surgery with A SIP OF WATER: Phenergan(if needed),Pain Pill(if needed),Diltiazem,and Albuterol(if needed)<<Bring Your Inhaler With You>>   Do not wear jewelry, make-up or nail polish.  Do not wear lotions, powders, or perfumes.   Do not shave 48 hours prior to surgery.  Do not bring valuables to the hospital.  Contacts, dentures or bridgework may not be worn into surgery.  Leave suitcase in the car. After surgery it may be brought to your room.  For patients admitted to the hospital, checkout time is 11:00 AM the day of discharge.   Special Instructions: CHG Shower Use Special Wash: 1/2 bottle night before surgery and 1/2 bottle morning of surgery.   Please read over the following fact sheets that you were given: Pain Booklet, Coughing and Deep Breathing, MRSA Information and Surgical Site Infection Prevention

## 2011-06-09 NOTE — Progress Notes (Signed)
Sleep study in epic from 07/17/09-doesn't require a CPAP

## 2011-06-09 NOTE — Progress Notes (Signed)
Dr.Stuckey is cardiologist;last visit in 2013-gives pt prescription for Coumadin Stress test in epic 05/14/11 Echo in epic from 2005/2012 Heart cath in epic 08/19/03  Medical Md is Dr.Michael Norrens with [facility] [address]-gives prescriptions for HTN

## 2011-06-09 NOTE — ED Provider Notes (Signed)
Medical screening examination/treatment/procedure(s) were performed by non-physician practitioner and as supervising physician I was immediately available for consultation/collaboration.   Laray Anger, DO 06/09/11 [phone]

## 2011-06-10 ENCOUNTER — Other Ambulatory Visit: Payer: Self-pay | Admitting: Neurosurgery

## 2011-06-10 MED ORDER — DEXAMETHASONE SODIUM PHOSPHATE 10 MG/ML IJ SOLN: 10.0000 mg | INTRAMUSCULAR | Status: AC

## 2011-06-10 MED ORDER — CEFAZOLIN SODIUM-DEXTROSE 2-3 GM-% IV SOLR
2.0000 g | INTRAVENOUS | Status: AC
  Administered 2011-06-11: 2 g via INTRAVENOUS
  Filled 2011-06-10: qty 50

## 2011-06-10 NOTE — Consult Note (Signed)
Anesthesia Chart Review:  Patient is a 71 year old female posted for 1 level ACDF on 06/11/11.  History includes DM2, former smoker, history of spinal headache, asthma, PSVT/afib, HLD, migraines, PUD, HTN, edema, colon CA s/p colectomy, anxiety, GERD, OSA, obesity with a BMI 42.  PCP is Dr. Debby Bud.  Her primary Cardiologist is Dr. Riley Kill, he cleared her for surgery on 05/18/11. However, she was admitted to [facility] on 06/07/11-06/08/11 for afib with RVR which converted back to SR on a Cardizem drip.  Cardiac enzymes were negative.  She was evaluated by Dr. Myrtis Ser, and he felt, "It is safe for her to proceed with her neurosurgical procedure to her neck."  Her EKG on 06/08/11 showed SB @ 58, minimal voltage criteria for LVH.  She had a stress test on 05/14/11 that showed: Normal stress nuclear study.  LV Ejection Fraction: 70%. LV Wall Motion: NL LV Function; NL Wall Motion.  Echo on 09/16/10 showed:  - Left ventricle: The cavity size was normal. Wall thickness was increased in a pattern of mild LVH. Systolic function was normal. The estimated ejection fraction was in the range of 55% to 60%.There was an increased relative contribution of atrial contraction to ventricular filling. - Aortic valve: Mild regurgitation.  Last cath was on 08/19/03 and showed normal coronaries.  CXR on 01/04/11 showed no acute cardiopulmonary disease.  Last sleep study was 07/17/09.    Labs from 06/07/11 and 06/08/11 noted.  PT/INR is WNL.   Plan to proceed.

## 2011-06-11 ENCOUNTER — Encounter (HOSPITAL_COMMUNITY): Payer: Self-pay | Admitting: Vascular Surgery

## 2011-06-11 ENCOUNTER — Inpatient Hospital Stay (HOSPITAL_COMMUNITY)
Admission: RE | Admit: 2011-06-11 | Discharge: 2011-06-12 | DRG: 473 | Disposition: A | Discharge status: Home / Self Care | Payer: Medicare Other | Source: Ambulatory Visit | Attending: Neurosurgery | Admitting: Neurosurgery

## 2011-06-11 ENCOUNTER — Encounter (HOSPITAL_COMMUNITY): Payer: Self-pay | Admitting: Neurosurgery

## 2011-06-11 ENCOUNTER — Encounter (HOSPITAL_COMMUNITY)
Admission: RE | Disposition: A | Discharge status: Home / Self Care | Payer: Self-pay | Source: Ambulatory Visit | Attending: Neurosurgery

## 2011-06-11 ENCOUNTER — Inpatient Hospital Stay (HOSPITAL_COMMUNITY): Payer: Medicare Other | Admitting: Vascular Surgery

## 2011-06-11 ENCOUNTER — Inpatient Hospital Stay (HOSPITAL_COMMUNITY): Payer: Medicare Other

## 2011-06-11 DIAGNOSIS — E119 Type 2 diabetes mellitus without complications: Secondary | ICD-10-CM | POA: Diagnosis present

## 2011-06-11 DIAGNOSIS — M1990 Unspecified osteoarthritis, unspecified site: Secondary | ICD-10-CM | POA: Diagnosis present

## 2011-06-11 DIAGNOSIS — Z981 Arthrodesis status: Secondary | ICD-10-CM

## 2011-06-11 DIAGNOSIS — R609 Edema, unspecified: Secondary | ICD-10-CM | POA: Diagnosis present

## 2011-06-11 DIAGNOSIS — M5000 Cervical disc disorder with myelopathy, unspecified cervical region: Principal | ICD-10-CM | POA: Diagnosis present

## 2011-06-11 DIAGNOSIS — Z472 Encounter for removal of internal fixation device: Secondary | ICD-10-CM

## 2011-06-11 DIAGNOSIS — E785 Hyperlipidemia, unspecified: Secondary | ICD-10-CM | POA: Diagnosis present

## 2011-06-11 DIAGNOSIS — G4730 Sleep apnea, unspecified: Secondary | ICD-10-CM | POA: Diagnosis present

## 2011-06-11 DIAGNOSIS — M4712 Other spondylosis with myelopathy, cervical region: Secondary | ICD-10-CM | POA: Diagnosis not present

## 2011-06-11 DIAGNOSIS — Z87891 Personal history of nicotine dependence: Secondary | ICD-10-CM

## 2011-06-11 DIAGNOSIS — M5020 Other cervical disc displacement, unspecified cervical region: Secondary | ICD-10-CM | POA: Diagnosis present

## 2011-06-11 DIAGNOSIS — K219 Gastro-esophageal reflux disease without esophagitis: Secondary | ICD-10-CM | POA: Diagnosis present

## 2011-06-11 DIAGNOSIS — IMO0001 Reserved for inherently not codable concepts without codable children: Secondary | ICD-10-CM | POA: Diagnosis present

## 2011-06-11 DIAGNOSIS — I10 Essential (primary) hypertension: Secondary | ICD-10-CM | POA: Diagnosis present

## 2011-06-11 DIAGNOSIS — C189 Malignant neoplasm of colon, unspecified: Secondary | ICD-10-CM | POA: Diagnosis not present

## 2011-06-11 DIAGNOSIS — M542 Cervicalgia: Secondary | ICD-10-CM | POA: Diagnosis not present

## 2011-06-11 DIAGNOSIS — I4891 Unspecified atrial fibrillation: Secondary | ICD-10-CM

## 2011-06-11 DIAGNOSIS — M531 Cervicobrachial syndrome: Secondary | ICD-10-CM | POA: Diagnosis not present

## 2011-06-11 DIAGNOSIS — Z96659 Presence of unspecified artificial knee joint: Secondary | ICD-10-CM

## 2011-06-11 DIAGNOSIS — Z79899 Other long term (current) drug therapy: Secondary | ICD-10-CM | POA: Diagnosis not present

## 2011-06-11 DIAGNOSIS — R0602 Shortness of breath: Secondary | ICD-10-CM | POA: Diagnosis not present

## 2011-06-11 DIAGNOSIS — J4489 Other specified chronic obstructive pulmonary disease: Secondary | ICD-10-CM | POA: Diagnosis present

## 2011-06-11 DIAGNOSIS — Z85038 Personal history of other malignant neoplasm of large intestine: Secondary | ICD-10-CM

## 2011-06-11 DIAGNOSIS — M961 Postlaminectomy syndrome, not elsewhere classified: Secondary | ICD-10-CM | POA: Diagnosis not present

## 2011-06-11 DIAGNOSIS — Z01812 Encounter for preprocedural laboratory examination: Secondary | ICD-10-CM

## 2011-06-11 DIAGNOSIS — F341 Dysthymic disorder: Secondary | ICD-10-CM | POA: Diagnosis present

## 2011-06-11 DIAGNOSIS — J449 Chronic obstructive pulmonary disease, unspecified: Secondary | ICD-10-CM | POA: Diagnosis present

## 2011-06-11 HISTORY — PX: ANTERIOR CERVICAL DECOMP/DISCECTOMY FUSION: SHX1161

## 2011-06-11 LAB — TYPE AND SCREEN
ABO/RH(D): O NEG
Antibody Screen: NEGATIVE

## 2011-06-11 LAB — GLUCOSE, CAPILLARY: Glucose-Capillary: 222 mg/dL — ABNORMAL HIGH (ref 70–99)

## 2011-06-11 SURGERY — ANTERIOR CERVICAL DECOMPRESSION/DISCECTOMY FUSION 1 LEVEL/HARDWARE REMOVAL
Anesthesia: General | Site: Neck | Wound class: Clean

## 2011-06-11 SURGERY — ANTERIOR CERVICAL DECOMPRESSION/DISCECTOMY FUSION 1 LEVEL/HARDWARE REMOVAL
Anesthesia: General

## 2011-06-11 MED ORDER — DILTIAZEM HCL ER COATED BEADS 240 MG PO CP24
240.0000 mg | ORAL_CAPSULE | Freq: Every day | ORAL | Status: DC
  Administered 2011-06-12: 240 mg via ORAL
  Filled 2011-06-11: qty 1

## 2011-06-11 MED ORDER — MORPHINE SULFATE 2 MG/ML IJ SOLN: 0.0500 mg/kg | INTRAMUSCULAR | Status: DC | PRN

## 2011-06-11 MED ORDER — CEFAZOLIN SODIUM 1-5 GM-% IV SOLN
1.0000 g | Freq: Three times a day (TID) | INTRAVENOUS | Status: AC
  Administered 2011-06-11 – 2011-06-12 (×2): 1 g via INTRAVENOUS
  Filled 2011-06-11 (×2): qty 50

## 2011-06-11 MED ORDER — ACETAMINOPHEN 650 MG RE SUPP: 650.0000 mg | RECTAL | Status: DC | PRN

## 2011-06-11 MED ORDER — HYDROMORPHONE HCL PF 1 MG/ML IJ SOLN
INTRAMUSCULAR | Status: AC
  Filled 2011-06-11: qty 1

## 2011-06-11 MED ORDER — SODIUM CHLORIDE 0.9 % IJ SOLN
3.0000 mL | Freq: Two times a day (BID) | INTRAMUSCULAR | Status: DC
  Administered 2011-06-11: 3 mL via INTRAVENOUS

## 2011-06-11 MED ORDER — LACTATED RINGERS IV SOLN: INTRAVENOUS | Status: DC | PRN

## 2011-06-11 MED ORDER — SODIUM CHLORIDE 0.9 % IV SOLN
INTRAVENOUS | Status: DC | PRN
  Administered 2011-06-11: 14:00:00 via INTRAVENOUS

## 2011-06-11 MED ORDER — PROMETHAZINE HCL 25 MG PO TABS: 25.0000 mg | ORAL_TABLET | Freq: Four times a day (QID) | ORAL | Status: DC | PRN

## 2011-06-11 MED ORDER — MIDAZOLAM HCL 5 MG/5ML IJ SOLN
INTRAMUSCULAR | Status: DC | PRN
  Administered 2011-06-11: 2 mg via INTRAVENOUS

## 2011-06-11 MED ORDER — THROMBIN 5000 UNITS EX KIT
PACK | CUTANEOUS | Status: DC | PRN
  Administered 2011-06-11 (×2): 5000 [IU] via TOPICAL

## 2011-06-11 MED ORDER — PROPOFOL 10 MG/ML IV EMUL
INTRAVENOUS | Status: DC | PRN
  Administered 2011-06-11: 200 mg via INTRAVENOUS

## 2011-06-11 MED ORDER — ACETAMINOPHEN 325 MG PO TABS: 650.0000 mg | ORAL_TABLET | ORAL | Status: DC | PRN

## 2011-06-11 MED ORDER — ROCURONIUM BROMIDE 100 MG/10ML IV SOLN
INTRAVENOUS | Status: DC | PRN
  Administered 2011-06-11: 50 mg via INTRAVENOUS

## 2011-06-11 MED ORDER — SODIUM CHLORIDE 0.9 % IV SOLN: 250.0000 mL | INTRAVENOUS | Status: DC

## 2011-06-11 MED ORDER — GLIMEPIRIDE 4 MG PO TABS
4.0000 mg | ORAL_TABLET | Freq: Every day | ORAL | Status: DC
  Filled 2011-06-11 (×2): qty 1

## 2011-06-11 MED ORDER — SENNA 8.6 MG PO TABS
1.0000 | ORAL_TABLET | Freq: Two times a day (BID) | ORAL | Status: DC
  Administered 2011-06-11: 8.6 mg via ORAL
  Filled 2011-06-11 (×3): qty 1

## 2011-06-11 MED ORDER — HYDROMORPHONE HCL PF 1 MG/ML IJ SOLN
0.5000 mg | INTRAMUSCULAR | Status: DC | PRN
  Filled 2011-06-11: qty 1

## 2011-06-11 MED ORDER — BISACODYL 10 MG RE SUPP: 10.0000 mg | Freq: Every day | RECTAL | Status: DC | PRN

## 2011-06-11 MED ORDER — MENTHOL 3 MG MT LOZG: 1.0000 | LOZENGE | OROMUCOSAL | Status: DC | PRN

## 2011-06-11 MED ORDER — DOCUSATE SODIUM 100 MG PO CAPS: 100.0000 mg | ORAL_CAPSULE | Freq: Two times a day (BID) | ORAL | Status: DC | PRN

## 2011-06-11 MED ORDER — ALBUTEROL SULFATE HFA 108 (90 BASE) MCG/ACT IN AERS
2.0000 | INHALATION_SPRAY | Freq: Four times a day (QID) | RESPIRATORY_TRACT | Status: DC | PRN
  Filled 2011-06-11: qty 6.7

## 2011-06-11 MED ORDER — PHENOL 1.4 % MT LIQD: 1.0000 | OROMUCOSAL | Status: DC | PRN

## 2011-06-11 MED ORDER — OXYCODONE-ACETAMINOPHEN 5-325 MG PO TABS
1.0000 | ORAL_TABLET | ORAL | Status: DC | PRN
  Administered 2011-06-11: 2 via ORAL
  Filled 2011-06-11: qty 2

## 2011-06-11 MED ORDER — BENAZEPRIL HCL 20 MG PO TABS
20.0000 mg | ORAL_TABLET | Freq: Two times a day (BID) | ORAL | Status: DC
  Administered 2011-06-11 – 2011-06-12 (×2): 20 mg via ORAL
  Filled 2011-06-11 (×3): qty 1

## 2011-06-11 MED ORDER — HYDROCODONE-ACETAMINOPHEN 5-325 MG PO TABS
1.0000 | ORAL_TABLET | ORAL | Status: DC | PRN
  Administered 2011-06-12 (×2): 2 via ORAL
  Filled 2011-06-11 (×2): qty 2

## 2011-06-11 MED ORDER — NEOSTIGMINE METHYLSULFATE 1 MG/ML IJ SOLN
INTRAMUSCULAR | Status: DC | PRN
  Administered 2011-06-11: 5 mg via INTRAVENOUS

## 2011-06-11 MED ORDER — SODIUM CHLORIDE 0.9 % IR SOLN
Status: DC | PRN
  Administered 2011-06-11: 15:00:00

## 2011-06-11 MED ORDER — FUROSEMIDE 20 MG PO TABS
20.0000 mg | ORAL_TABLET | Freq: Every day | ORAL | Status: DC
  Filled 2011-06-11: qty 1

## 2011-06-11 MED ORDER — ONDANSETRON HCL 4 MG/2ML IJ SOLN: 4.0000 mg | Freq: Once | INTRAMUSCULAR | Status: DC | PRN

## 2011-06-11 MED ORDER — CYCLOBENZAPRINE HCL 10 MG PO TABS
10.0000 mg | ORAL_TABLET | Freq: Three times a day (TID) | ORAL | Status: DC | PRN
  Administered 2011-06-11: 10 mg via ORAL
  Filled 2011-06-11: qty 1

## 2011-06-11 MED ORDER — 0.9 % SODIUM CHLORIDE (POUR BTL) OPTIME
TOPICAL | Status: DC | PRN
  Administered 2011-06-11: 1000 mL

## 2011-06-11 MED ORDER — DEXAMETHASONE SODIUM PHOSPHATE 4 MG/ML IJ SOLN
INTRAMUSCULAR | Status: DC | PRN
  Administered 2011-06-11: 4 mg via INTRAVENOUS

## 2011-06-11 MED ORDER — VECURONIUM BROMIDE 10 MG IV SOLR
INTRAVENOUS | Status: DC | PRN
  Administered 2011-06-11: 2 mg via INTRAVENOUS

## 2011-06-11 MED ORDER — HEMOSTATIC AGENTS (NO CHARGE) OPTIME
TOPICAL | Status: DC | PRN
  Administered 2011-06-11: 1 via TOPICAL

## 2011-06-11 MED ORDER — ALUM & MAG HYDROXIDE-SIMETH 200-200-20 MG/5ML PO SUSP: 30.0000 mL | Freq: Four times a day (QID) | ORAL | Status: DC | PRN

## 2011-06-11 MED ORDER — OXYCODONE-ACETAMINOPHEN 5-325 MG PO TABS: 1.0000 | ORAL_TABLET | Freq: Once | ORAL | Status: DC

## 2011-06-11 MED ORDER — ONDANSETRON HCL 4 MG/2ML IJ SOLN
INTRAMUSCULAR | Status: DC | PRN
  Administered 2011-06-11: 4 mg via INTRAVENOUS

## 2011-06-11 MED ORDER — GLYCOPYRROLATE 0.2 MG/ML IJ SOLN
INTRAMUSCULAR | Status: DC | PRN
  Administered 2011-06-11: .8 mg via INTRAVENOUS

## 2011-06-11 MED ORDER — SODIUM CHLORIDE 0.9 % IJ SOLN: 3.0000 mL | INTRAMUSCULAR | Status: DC | PRN

## 2011-06-11 MED ORDER — FLEET ENEMA 7-19 GM/118ML RE ENEM
1.0000 | ENEMA | Freq: Once | RECTAL | Status: AC | PRN
  Filled 2011-06-11: qty 1

## 2011-06-11 MED ORDER — ONDANSETRON HCL 4 MG/2ML IJ SOLN
4.0000 mg | INTRAMUSCULAR | Status: DC | PRN
  Administered 2011-06-11: 4 mg via INTRAVENOUS
  Filled 2011-06-11: qty 2

## 2011-06-11 MED ORDER — MIDAZOLAM HCL 2 MG/2ML IJ SOLN: 1.0000 mg | INTRAMUSCULAR | Status: DC | PRN

## 2011-06-11 MED ORDER — POLYETHYLENE GLYCOL 3350 17 G PO PACK
17.0000 g | PACK | Freq: Every day | ORAL | Status: DC | PRN
  Filled 2011-06-11: qty 1

## 2011-06-11 MED ORDER — MIDAZOLAM HCL 2 MG/2ML IJ SOLN
INTRAMUSCULAR | Status: AC
  Administered 2011-06-11 (×2): 1 mg
  Filled 2011-06-11: qty 2

## 2011-06-11 MED ORDER — HYDROMORPHONE HCL PF 1 MG/ML IJ SOLN
0.2500 mg | INTRAMUSCULAR | Status: DC | PRN
  Administered 2011-06-11 (×2): 0.5 mg via INTRAVENOUS

## 2011-06-11 MED ORDER — OXYCODONE-ACETAMINOPHEN 5-325 MG PO TABS
ORAL_TABLET | ORAL | Status: AC
  Filled 2011-06-11: qty 1

## 2011-06-11 MED ORDER — FENTANYL CITRATE 0.05 MG/ML IJ SOLN
INTRAMUSCULAR | Status: DC | PRN
  Administered 2011-06-11 (×2): 50 ug via INTRAVENOUS
  Administered 2011-06-11 (×3): 100 ug via INTRAVENOUS
  Administered 2011-06-11: 50 ug via INTRAVENOUS

## 2011-06-11 MED ORDER — ZOLPIDEM TARTRATE 5 MG PO TABS: 5.0000 mg | ORAL_TABLET | Freq: Every evening | ORAL | Status: DC | PRN

## 2011-06-11 MED ORDER — ALPRAZOLAM 0.25 MG PO TABS
0.2500 mg | ORAL_TABLET | Freq: Two times a day (BID) | ORAL | Status: DC | PRN
  Administered 2011-06-11: 0.25 mg via ORAL
  Filled 2011-06-11: qty 1

## 2011-06-11 SURGICAL SUPPLY — 54 items
BAG DECANTER FOR FLEXI CONT (MISCELLANEOUS) ×2 IMPLANT
BENZOIN TINCTURE PRP APPL 2/3 (GAUZE/BANDAGES/DRESSINGS) ×2 IMPLANT
BRUSH SCRUB EZ PLAIN DRY (MISCELLANEOUS) ×2 IMPLANT
BUR MATCHSTICK NEURO 3.0 LAGG (BURR) ×2 IMPLANT
CANISTER SUCTION 2500CC (MISCELLANEOUS) ×2 IMPLANT
CLOTH BEACON ORANGE TIMEOUT ST (SAFETY) ×2 IMPLANT
CONT SPEC 4OZ CLIKSEAL STRL BL (MISCELLANEOUS) ×2 IMPLANT
DRAPE C-ARM 42X72 X-RAY (DRAPES) ×4 IMPLANT
DRAPE LAPAROTOMY 100X72 PEDS (DRAPES) ×2 IMPLANT
DRAPE MICROSCOPE ZEISS OPMI (DRAPES) ×2 IMPLANT
DRAPE POUCH INSTRU U-SHP 10X18 (DRAPES) ×2 IMPLANT
DRILL BIT (BIT) ×2 IMPLANT
ELECT COATED BLADE 2.86 ST (ELECTRODE) ×2 IMPLANT
ELECT REM PT RETURN 9FT ADLT (ELECTROSURGICAL) ×2
ELECTRODE REM PT RTRN 9FT ADLT (ELECTROSURGICAL) ×1 IMPLANT
GAUZE SPONGE 4X4 16PLY XRAY LF (GAUZE/BANDAGES/DRESSINGS) IMPLANT
GLOVE BIOGEL PI IND STRL 7.0 (GLOVE) ×1 IMPLANT
GLOVE BIOGEL PI IND STRL 7.5 (GLOVE) ×1 IMPLANT
GLOVE BIOGEL PI INDICATOR 7.0 (GLOVE) ×1
GLOVE BIOGEL PI INDICATOR 7.5 (GLOVE) ×1
GLOVE ECLIPSE 7.5 STRL STRAW (GLOVE) ×6 IMPLANT
GLOVE ECLIPSE 8.5 STRL (GLOVE) ×4 IMPLANT
GLOVE EXAM NITRILE LRG STRL (GLOVE) IMPLANT
GLOVE EXAM NITRILE MD LF STRL (GLOVE) IMPLANT
GLOVE EXAM NITRILE XL STR (GLOVE) IMPLANT
GLOVE EXAM NITRILE XS STR PU (GLOVE) IMPLANT
GLOVE SS BIOGEL STRL SZ 6.5 (GLOVE) ×2 IMPLANT
GLOVE SUPERSENSE BIOGEL SZ 6.5 (GLOVE) ×2
GOWN BRE IMP SLV AUR LG STRL (GOWN DISPOSABLE) ×2 IMPLANT
GOWN BRE IMP SLV AUR XL STRL (GOWN DISPOSABLE) ×6 IMPLANT
GOWN STRL REIN 2XL LVL4 (GOWN DISPOSABLE) IMPLANT
HEAD HALTER (SOFTGOODS) ×2 IMPLANT
KIT BASIN OR (CUSTOM PROCEDURE TRAY) ×2 IMPLANT
KIT ROOM TURNOVER OR (KITS) ×2 IMPLANT
NEEDLE SPNL 20GX3.5 QUINCKE YW (NEEDLE) ×2 IMPLANT
NS IRRIG 1000ML POUR BTL (IV SOLUTION) ×2 IMPLANT
PACK LAMINECTOMY NEURO (CUSTOM PROCEDURE TRAY) ×2 IMPLANT
PAD ARMBOARD 7.5X6 YLW CONV (MISCELLANEOUS) ×6 IMPLANT
PLATE VISION ELITE 27.5MM (Plate) ×2 IMPLANT
RUBBERBAND STERILE (MISCELLANEOUS) ×4 IMPLANT
SCREW ST FIX 4 ATL 3120213 (Screw) ×8 IMPLANT
SPACER BONE CORNERSTONE 7X14 (Orthopedic Implant) ×2 IMPLANT
SPONGE GAUZE 4X4 12PLY (GAUZE/BANDAGES/DRESSINGS) ×2 IMPLANT
SPONGE INTESTINAL PEANUT (DISPOSABLE) ×2 IMPLANT
SPONGE SURGIFOAM ABS GEL SZ50 (HEMOSTASIS) ×2 IMPLANT
STRIP CLOSURE SKIN 1/2X4 (GAUZE/BANDAGES/DRESSINGS) ×2 IMPLANT
SUT PDS AB 5-0 P3 18 (SUTURE) ×2 IMPLANT
SUT VIC AB 3-0 SH 8-18 (SUTURE) ×2 IMPLANT
SYR 20ML ECCENTRIC (SYRINGE) ×2 IMPLANT
TAPE CLOTH 4X10 WHT NS (GAUZE/BANDAGES/DRESSINGS) ×2 IMPLANT
TAPE CLOTH SURG 4X10 WHT LF (GAUZE/BANDAGES/DRESSINGS) ×2 IMPLANT
TOWEL OR 17X24 6PK STRL BLUE (TOWEL DISPOSABLE) ×2 IMPLANT
TOWEL OR 17X26 10 PK STRL BLUE (TOWEL DISPOSABLE) ×2 IMPLANT
WATER STERILE IRR 1000ML POUR (IV SOLUTION) ×2 IMPLANT

## 2011-06-11 NOTE — Anesthesia Postprocedure Evaluation (Signed)
  Anesthesia Post-op Note  Patient: [name]  Procedure(s) Performed: Procedure(s) (LRB): ANTERIOR CERVICAL DECOMPRESSION/DISCECTOMY FUSION 1 LEVEL/HARDWARE REMOVAL (N/A)  Patient Location: PACU  Anesthesia Type: General  Level of Consciousness: awake and alert   Airway and Oxygen Therapy: Patient Spontanous Breathing and Patient connected to nasal cannula oxygen  Post-op Pain: mild  Post-op Assessment: Post-op Vital signs reviewed  Post-op Vital Signs: Reviewed  Complications: No apparent anesthesia complications

## 2011-06-11 NOTE — Op Note (Signed)
Date of procedure: 06/11/2011  Date of dictation: Same  Service: Neurosurgery  Preoperative diagnosis: C4-5 herniated nucleus pulposus with myelopathy and instability. Status post C5-C7 anterior cervical fusion, possible C5-6 and C6-7 pseudoarthrosis.  Postoperative diagnosis: Same, no evidence of severe arthrosis at C5-6 and C6-7.  Procedure Name: C4-5 anterior cervical discectomy and fusion with allograft and anterior plating.  Reexploration of C5-C7 anterior cervical fusion with removal of C5-C7 anterior cervical plate.  Surgeon:Jyllian Haynie A.Anjeanette Petzold, M.D.  Asst. Surgeon: Gerlene Fee  Anesthesia: General  Indication: 71 year old female with severe neck pain and bilateral shoulder pain. Patient is status post C5-6 and C6-7 anterior cervical disc discectomy and fusion approximately 6 months ago. Workup demonstrates evidence of a C4-5 central disc herniation. Plain films are indeterminate but worrisome for possible pseudarthrosis at C5-6 and C6-7. Plan for a C4-5 anterior cervical discectomy and reexploration of C5-6 and C6-7 anterior cervical fusion.  Operative note: After induction of anesthesia, patient positioned supine with neck extended and halter traction. Patient's anterior cervical region prepped and draped sterilely. Previous incision reopened. Dissection disease on the medial border of the sternocleidomastoid muscle and carotid sheath. Trachea and esophagus mobilized track towards the left. Prevertebral fascia stripped off the anterior spinal column. Longus colli muscles elevated bilaterally. The previously placed anterior cervical plate at Z6-1 and 7 was disassembled and removed. Fusion at C5-6 and C6-7 was explored. At both levels fusion appeared quite solid without evidence of complicating features. The disc space itself at C4-5 was grossly disrupted. The spaces incised with 15 blade a rectangular fashion. Y. disc space clear was achieved using pituitary rongeurs forward and backward on  progress Kerrison rongeurs high-speed drill prominence the disc were removed down to the posterior annulus. Microscope and brought into the field and used at the remainder of the discectomy. Remaining aspects of annulus ossifies removed using a high-speed drill down to the level of the posterior longitudinal ligament. Posterior longitudinal ligament was then elevated and resected in piecemeal fashion using Kerrison rongeurs. Underlying thecal sac was identified. Wide central decompression was then performed by undercutting the bodies of C5 and C6. Decompression MCH are of foramen. Wide anterior foraminotomies were then performed along the course the exiting C5 nerve roots bilaterally. This went very thorough depression achieved. There is no as of injury to the thecal sac or nerve roots. Wound was placed topically for hemostasis. A 7 mm cornerstone allograft wedges and packed into place teslas the 1 mm from the anterior cortical surface of C4 and C5. 27 mm Atlantis anterior cervical plate was in place of the C4 and C5 levels. This is an attachment or septic guidance using 13 mm fixed angle screws to reach above shoulder all 4 screws given a final tightening down to be solidly within bone. Locking screws engaged at both levels. Final images revealed good position of the bone graft and hardware proper operative level with normal lamina spine. Wound is then irrigated and inspected for hemostasis which Ascent be good. Is then closed in a typical fashion. Steri-Strips triggers were applied. There were no apparent complications. Patient tolerated well and she returns to the recovery room postop.

## 2011-06-11 NOTE — Preoperative (Signed)
Beta Blockers   Reason not to administer Beta Blockers:Not Applicable 

## 2011-06-11 NOTE — Progress Notes (Addendum)
Pt up to restroom noted bleeding on gown dressing saturated Dr Phoebe Perch notified, change dressing.  Dressing changed per md

## 2011-06-11 NOTE — Brief Op Note (Signed)
06/11/2011  4:01 PM  PATIENT:  [name]  71 y.o. female  PRE-OPERATIVE DIAGNOSIS:  Cervical four-five herniated nucleus pulposus with myelopathy  POST-OPERATIVE DIAGNOSIS:  Cervical four-five herniated nucleus pulposus with myelopathy  PROCEDURE:  Procedure(s) (LRB): ANTERIOR CERVICAL DECOMPRESSION/DISCECTOMY FUSION 1 LEVEL/HARDWARE REMOVAL (N/A)  SURGEON:  Surgeon(s) and Role:    * Temple Pacini, MD - Primary    * Reinaldo Meeker, MD - Assisting  PHYSICIAN ASSISTANT:   ASSISTANTS Kritzer ANESTHESIA:   general  EBL:     BLOOD ADMINISTERED:none  DRAINS: none   LOCAL MEDICATIONS USED:  NONE  SPECIMEN:  No Specimen  DISPOSITION OF SPECIMEN:  N/A  COUNTS:  YES  TOURNIQUET:  * No tourniquets in log *  DICTATION: .Dragon Dictation  PLAN OF CARE: Admit to inpatient   PATIENT DISPOSITION:  PACU - hemodynamically stable.   Delay start of Pharmacological VTE agent (>24hrs) due to surgical blood loss or risk of bleeding: yes

## 2011-06-11 NOTE — Transfer of Care (Signed)
Immediate Anesthesia Transfer of Care Note  Patient: [name]  Procedure(s) Performed: Procedure(s) (LRB): ANTERIOR CERVICAL DECOMPRESSION/DISCECTOMY FUSION 1 LEVEL/HARDWARE REMOVAL (N/A)  Patient Location: PACU  Anesthesia Type: General  Level of Consciousness: awake, alert  and oriented  Airway & Oxygen Therapy: Patient Spontanous Breathing and Patient connected to nasal cannula oxygen  Post-op Assessment: Report given to PACU RN, Post -op Vital signs reviewed and stable and Patient moving all extremities  Post vital signs: Reviewed and stable  Complications: No apparent anesthesia complications

## 2011-06-11 NOTE — H&P (Signed)
[name] is an 71 y.o. female.   Chief Complaint: Neck pain. HPI: 71 year old female status post previous C5-6 C6-7 anterior cervical discectomy and fusion with allograft and anterior plating presents with worsening neck pain and bilateral shoulder discomfort. Workup has demonstrated evidence of C4-5 disc herniation above the level of her previous fusion with spinal cord and nerve root compression. Patient presents now for C4-5 anterior cervical discectomy and fusion with allograft and her plating in hopes of improving her symptoms.  Patient recently admitted to the hospital for transient atrial fibrillation. Patient with chronic history of atrial fibrillation usually anticoagulated. Patient currently off her anticoagulants for the planned surgery. Seen by cardiology and the hospital and cleared for surgery. S2  Past Medical History  Diagnosis Date  . Allergic rhinitis   . Asthma   . Gout     takes Colchicine prn  . Peptic ulcer disease   . History of PSVT (paroxysmal supraventricular tachycardia)   . Hx of colonoscopy   . HLD (hyperlipidemia)     pt doesn't take any medication for this  . DJD (degenerative joint disease)   . History of blood clots     right lung in early 90's  . Sleep apnea     doesn't use CPAP  . Shortness of breath     pt states that she can be sitting as well as exertion and get short of breath  . Bronchitis     in Jan 2013-uses Albuterol prn  . History of migraine headaches     last migraine 47yrs ago;Pt states she does get sinus headaches  . Fibromyalgia   . Osteoarthritis   . Chronic back pain     hx buldging disc  . Rash     on neck and started after she started taking her beta blocker 41yrs ago;medical MD has seen this  . GERD (gastroesophageal reflux disease)     uses Rolaids prn  . Gastric ulcer   . Hemorrhoids   . Family hx of colon cancer   . Diabetes mellitus     glimepiride  . Yeast infection of the vagina     being treated for this now by  dr.fontaine  . Spinal headache     after 2nd child  . Bronchitis, acute 03-01-11    FINSHED Z-PAK-STILL ON PREDNISONE NOW  . Hypertension     takes Benazepril and Diltiazem daily  . Atrial fibrillation     has been off of coumadin since 06/02/11 d/t surgery  . Peripheral edema     takes Furosemide every other day  . Dizziness     occasionally  . Constipation     takes Colace prn  . Colon cancer 2007    s/p surgery and chemo  . Colon cancer   . Urinary frequency   . Urinary urgency   . Nocturia   . Anxiety     and panic attacks;pt states that she is claustrophobic    Past Surgical History  Procedure Date  . Caesarean section 1966/69/72    x 3  . Heel spur surgery 1992    x 2  . Ankle surgery     tumor-benign removed left ankle  . Lumbar disc surgery 2008  . Colectomy 2007    colon cancer  . Pyelonidal cystectomy     at age 63  . Knee arthrosocpy     bil;couple of years before knee replacement  . Ganglion cyst excision > 36yrs ago  removed from left pointer finger  . Cataract surgery 2010  . Tubal ligation 1972  . Cardiac catheterization 90's and 2005  . Port a cath placed 2008  . Port a cath removed 2008  . Knee surgery     left TKA  . Anterior cervical decomp/discectomy fusion 01/12/2011    Procedure: ANTERIOR CERVICAL DECOMPRESSION/DISCECTOMY FUSION 2 LEVELS;  Surgeon: Kathaleen Maser Aquarius Latouche;  Location: [facility];  Service: Neurosurgery;  Laterality: Bilateral;  Cervical five-six, six-seven anterior cervical discectomy and fusion with allograft and plating  . Hysteroscopy w/d&c 02/19/2011    Procedure: DILATATION AND CURETTAGE /HYSTEROSCOPY;  Surgeon: Dara Lords, MD;  Location: [facility];  Service: Gynecology;  Laterality: N/A;  requests one hour  . Dilation and curettage of uterus 02-19-11    & POLYP REMOVAL    Family History  Problem Relation Age of Onset  . Coronary artery disease Other   . Heart attack Mother   . Stroke Mother   . Hypertension Mother     . Heart disease Father   . Hypertension Sister   . Cancer Sister     left kidney cancer  . Cancer Brother     bladder  . Cancer Brother     liver, spread to colon  . Liver cancer Brother   . Anesthesia problems Neg Hx   . Hypotension Neg Hx   . Malignant hyperthermia Neg Hx   . Pseudochol deficiency Neg Hx    Social History:  reports that she has quit smoking. Her smoking use included Cigarettes. She has a 45 pack-year smoking history. She has never used smokeless tobacco. She reports that she does not drink alcohol or use illicit drugs.  Allergies:  Allergies  Allergen Reactions  . Adhesive (Tape) Other (See Comments)    redness  . Morphine Other (See Comments)    REACTION: Reaction not  Makes you feel bad all over  . Nifedipine Hives and Itching  . Piroxicam Hives and Itching    Medications Prior to Admission  Medication Dose Route Frequency Provider Last Rate Last Dose  . ceFAZolin (ANCEF) IVPB 2 g/50 mL premix  2 g Intravenous 60 min Pre-Op Temple Pacini, MD      . HYDROmorphone (DILAUDID) injection 0.25-0.5 mg  0.25-0.5 mg Intravenous Q5 min PRN Kerby Nora, MD      . morphine 2 MG/ML injection 5.59 mg  0.05 mg/kg Intravenous Q10 min PRN Kerby Nora, MD      . ondansetron [facility]) injection 4 mg  4 mg Intravenous Once PRN Kerby Nora, MD      . oxyCODONE-acetaminophen (PERCOCET) 5-325 MG per tablet 1 tablet  1 tablet Oral Once Kerby Nora, MD      . DISCONTD: oxyCODONE-acetaminophen (PERCOCET) 5-325 MG per tablet            Medications Prior to Admission  Medication Sig Dispense Refill  . albuterol (PROVENTIL HFA;VENTOLIN HFA) 108 (90 BASE) MCG/ACT inhaler Inhale 2 puffs into the lungs every 6 (six) hours as needed. Shortness of breath      . benazepril (LOTENSIN) 20 MG tablet Take 1 tablet (20 mg total) by mouth 2 (two) times daily.  60 tablet  11  . diltiazem (CARDIZEM CD) 240 MG 24 hr capsule Take 240 mg by mouth daily.        [name] glimepiride (AMARYL) 4 MG  tablet Take 4 mg by mouth daily before breakfast.      . loperamide (IMODIUM)  2 MG capsule Take 2 mg by mouth 4 (four) times daily as needed. Loose stools      . oxyCODONE-acetaminophen (PERCOCET) 5-325 MG per tablet Take 1 tablet by mouth every 4 (four) hours as needed. pain  90 tablet  0  . promethazine (PHENERGAN) 25 MG tablet Take 1 tablet (25 mg total) by mouth every 6 (six) hours as needed. Only takes for severe nausea  60 tablet  3  . colchicine 0.6 MG tablet Take 0.6 mg by mouth daily as needed. gout      . promethazine-codeine (PHENERGAN WITH CODEINE) 6.25-10 MG/5ML syrup Take 5 mLs by mouth every 4 (four) hours as needed for cough.  120 mL  0    No results found for this or any previous visit (from the past 48 hour(s)). No results found.  Review of Systems  Constitutional: Negative.   HENT: Negative.   Eyes: Negative.   Respiratory: Negative.   Cardiovascular: Negative.   Gastrointestinal: Negative.   Genitourinary: Negative.   Musculoskeletal: Negative.   Skin: Negative.   Neurological: Negative.   Endo/Heme/Allergies: Negative.   Psychiatric/Behavioral: Negative.     Weight 111.8 kg (246 lb 7.6 oz), last menstrual period 12/30/2000. Physical Exam  Constitutional: She is oriented to person, place, and time. She appears well-developed and well-nourished.  HENT:  Head: Normocephalic and atraumatic.  Right Ear: External ear normal.  Left Ear: External ear normal.  Nose: Nose normal.  Mouth/Throat: Oropharynx is clear and moist.  Eyes: Conjunctivae and EOM are normal. Pupils are equal, round, and reactive to light.  Neck: Normal range of motion. Neck supple. No tracheal deviation present. No thyromegaly present.  Cardiovascular: Normal rate, regular rhythm, normal heart sounds and intact distal pulses.  Exam reveals no friction rub.   No murmur heard. Respiratory: Effort normal and breath sounds normal. No respiratory distress. She has no wheezes.  GI: Soft. Bowel  sounds are normal. She exhibits no distension. There is no tenderness.  Musculoskeletal: Normal range of motion. She exhibits no edema and no tenderness.  Neurological: She is alert and oriented to person, place, and time. She has normal reflexes. No cranial nerve deficit. Coordination normal.  Skin: Skin is warm and dry.  Psychiatric: She has a normal mood and affect. Her behavior is normal. Judgment and thought content normal.     Assessment/Plan C4-5 herniated nucleus pulposus with stenosis. Plan C4-5 anterior cervical discectomy and fusion with allograft or plating. Removal of previous anterior plating instrumentation C5-C7. Risks and benefits have been explained patient wishes to proceed.  [name] A 06/11/2011, 2:00 PM

## 2011-06-11 NOTE — Anesthesia Procedure Notes (Signed)
Procedure Name: Intubation Date/Time: 06/11/2011 2:24 PM Performed by: Delbert Harness Pre-anesthesia Checklist: Patient identified, Timeout performed, Emergency Drugs available, Patient being monitored and Suction available Patient Re-evaluated:Patient Re-evaluated prior to inductionOxygen Delivery Method: Circle system utilized Preoxygenation: Pre-oxygenation with 100% oxygen Intubation Type: IV induction Ventilation: Mask ventilation without difficulty and Oral airway inserted - appropriate to patient size Laryngoscope Size: Mac and 4 Grade View: Grade III Tube type: Oral Tube size: 7.0 mm Number of attempts: 1 Airway Equipment and Method: LTA kit utilized and Stylet Placement Confirmation: ETT inserted through vocal cords under direct vision,  positive ETCO2 and breath sounds checked- equal and bilateral Secured at: 22 cm Tube secured with: Tape Dental Injury: Teeth and Oropharynx as per pre-operative assessment

## 2011-06-11 NOTE — Progress Notes (Signed)
Pt states "needs med for anxiety, help calm asthma" 95%RA  Dr Phoebe Perch notified,  Will be over to assess pt.  Will continue to monitor

## 2011-06-11 NOTE — Anesthesia Preprocedure Evaluation (Addendum)
Anesthesia Evaluation  Patient identified by MRN, date of birth, ID band Patient awake    Reviewed: Allergy & Precautions, H&P , NPO status , Patient's Chart, lab work & pertinent test results, reviewed documented beta blocker date and time   Airway Mallampati: II TM Distance: >3 FB Neck ROM: Full    Dental  (+) Teeth Intact and Dental Advisory Given   Pulmonary shortness of breath, asthma , sleep apnea , COPD COPD inhaler,  breath sounds clear to auscultation        Cardiovascular hypertension, Pt. on medications + dysrhythmias Atrial Fibrillation Rhythm:Regular Rate:Normal + Systolic murmurs (Known mild  AI)    Neuro/Psych  Headaches, Anxiety Depression  Neuromuscular disease    GI/Hepatic PUD, GERD-  Medicated,  Endo/Other  Diabetes mellitus-, Well Controlled, Type obesity  Renal/GU      Musculoskeletal  (+) Fibromyalgia -  Abdominal (+)  Abdomen: soft. Bowel sounds: normal.  Peds  Hematology   Anesthesia Other Findings   Reproductive/Obstetrics                        Anesthesia Physical Anesthesia Plan  ASA: III  Anesthesia Plan: General   Post-op Pain Management:    Induction: Intravenous  Airway Management Planned: Oral ETT  Additional Equipment:   Intra-op Plan:   Post-operative Plan: Extubation in OR  Informed Consent: I have reviewed the patients History and Physical, chart, labs and discussed the procedure including the risks, benefits and alternatives for the proposed anesthesia with the patient or authorized representative who has indicated his/her understanding and acceptance.   Dental advisory given  Plan Discussed with: CRNA, Anesthesiologist and Surgeon  Anesthesia Plan Comments:         Anesthesia Quick Evaluation

## 2011-06-11 NOTE — Progress Notes (Signed)
Doing well. C/o appropriate incisional soreness.c/o anxiety trouble breathing - has had these symptoms pre-op too  Temp:  [97.2 F (36.2 C)-98.3 F (36.8 C)] 98.3 F (36.8 C) (04/19 1800) Pulse Rate:  [58-65] 60  (04/19 1800) Resp:  [11-20] 20  (04/19 1800) BP: (140-185)/(52-82) 163/81 mmHg (04/19 1830) SpO2:  [93 %-97 %] 95 % (04/19 1800) Weight:  [111.8 kg (246 lb 7.6 oz)] 111.8 kg (246 lb 7.6 oz) (04/19 1300) Good strength and sensation Incision- drainage on dressing - neck soft - voice ok , no mass in neck  Plan: Change dressing prn - watch incision area closely

## 2011-06-11 NOTE — Progress Notes (Signed)
Swelling noted right of incision pt c/o "throat swelling"  Dr Phoebe Perch notified, monitor pt.  Will continue to monitor

## 2011-06-12 LAB — GLUCOSE, CAPILLARY: Glucose-Capillary: 224 mg/dL — ABNORMAL HIGH (ref 70–99)

## 2011-06-12 MED ORDER — CYCLOBENZAPRINE HCL 10 MG PO TABS: 10.0000 mg | ORAL_TABLET | Freq: Three times a day (TID) | ORAL | Status: AC | PRN

## 2011-06-12 NOTE — Plan of Care (Signed)
Problem: Consults Goal: Diagnosis - Spinal Surgery Outcome: Completed/Met Date Met:  06/12/11 Cervical Spine Fusion

## 2011-06-12 NOTE — Discharge Summary (Signed)
Physician Discharge Summary  Patient ID: [name] MRN: [id number] DOB/AGE: Feb 08, 1941 71 y.o.  Admit date: 06/11/2011 Discharge date: 06/12/2011  Admission Diagnoses:C4-5 herniated nucleus pulposus with myelopathy and instability. Status post C5-C7 anterior cervical fusion, possible C5-6 and C6-7 pseudoarthrosis.   Discharge Diagnoses: C4-5 herniated nucleus pulposus with myelopathy and instability. Status post C5-C7 anterior cervical fusion, possible C5-6 and C6-7 pseudoarthrosis.  Principal Problem:  *Herniated cervical disc   Discharged Condition: good  Hospital Course: pt admitted and underwent surgery below - pt with less arm pain , ambulating, swallowing well, voice good  Consults: None  Significant Diagnostic Studies: none  Treatments: surgery: C4-5 anterior cervical discectomy and fusion with allograft and anterior plating.  Reexploration of C5-C7 anterior cervical fusion with removal of C5-C7 anterior cervical plate.      Discharge Exam: Blood pressure 157/90, pulse 86, temperature 98.6 F (37 C), temperature source Oral, resp. rate 16, weight 111.8 kg (246 lb 7.6 oz), last menstrual period 12/30/2000, SpO2 96.00%. Wound:c/d/i  Disposition: home  Discharge Orders    Future Appointments: Provider: Department: Dept Phone: Center:   06/14/2011 12:30 PM [facility] [phone] None   06/29/2011 10:15 AM Herby Abraham, MD [facility] [phone] [facility]   08/16/2011 11:15 AM Herby Abraham, MD [facility] [phone] [facility]     Medication List  As of 06/12/2011  9:46 AM   TAKE these medications         albuterol 108 (90 BASE) MCG/ACT inhaler   Commonly known as: PROVENTIL HFA;VENTOLIN HFA   Inhale 2 puffs into the lungs every 6 (six) hours as needed. Shortness of breath      aspirin 325 MG tablet   Take 1 tablet (325 mg total) by mouth daily. Discuss holding this medication with your surgeon     benazepril 20 MG tablet   Commonly known as: LOTENSIN   Take 1 tablet (20 mg total) by mouth 2 (two) times daily.      colchicine 0.6 MG tablet   Take 0.6 mg by mouth daily as needed. gout      cyclobenzaprine 10 MG tablet   Commonly known as: FLEXERIL   Take 1 tablet (10 mg total) by mouth 3 (three) times daily as needed for muscle spasms.      diltiazem 240 MG 24 hr capsule   Commonly known as: CARDIZEM CD   Take 240 mg by mouth daily.      docusate sodium 100 MG capsule   Commonly known as: COLACE   Take 100 mg by mouth 2 (two) times daily as needed. For constipation.      furosemide 20 MG tablet   Commonly known as: LASIX   Take 20 mg by mouth daily.      glimepiride 4 MG tablet   Commonly known as: AMARYL   Take 4 mg by mouth daily before breakfast.      loperamide 2 MG capsule   Commonly known as: IMODIUM   Take 2 mg by mouth 4 (four) times daily as needed. Loose stools      oxyCODONE-acetaminophen 5-325 MG per tablet   Commonly known as: PERCOCET   Take 1 tablet by mouth every 4 (four) hours as needed. pain      promethazine 25 MG tablet   Commonly known as: PHENERGAN   Take 1 tablet (25 mg total) by mouth every 6 (six) hours as needed. Only takes for severe nausea  warfarin 5 MG tablet   Commonly known as: COUMADIN   Discuss holding this medication with your surgeon.  Take 5 mg on Sunday & Thursday; take 2.5 mg [0.5 tab] all other days of week.             SignedClydene Fake, MD 06/12/2011, 9:46 AM

## 2011-06-12 NOTE — Discharge Instructions (Signed)
Wound Care Keep incision covered and dry for one week.  If you shower prior to then, cover incision with plastic wrap.  You may remove outer bandage after one week and shower.  Do not put any creams, lotions, or ointments on incision. Leave steri-strips on neck.  They will fall off by themselves. Activity Walk each and every day, increasing distance each day. No lifting greater than 5 lbs.  Avoid excessive neck motion. No driving for 2 weeks; may ride as a passenger locally. Wear neck brace at all times except when showering. Diet Resume your normal diet.  Return to Work Will be discussed at you follow up appointment. Call Your Doctor If Any of These Occur Redness, drainage, or swelling at the wound.  Temperature greater than 101 degrees. Severe pain not relieved by pain medication. Increased difficulty swallowing.  Incision starts to come apart. Follow Up Appt Call today for appointment in 1-2 weeks [phone]) or for problems.  If you have any hardware placed in your spine, you will need an x-ray before your appointment. 

## 2011-06-14 ENCOUNTER — Encounter (HOSPITAL_COMMUNITY): Payer: Self-pay | Admitting: Neurosurgery

## 2011-06-28 ENCOUNTER — Ambulatory Visit (INDEPENDENT_AMBULATORY_CARE_PROVIDER_SITE_OTHER)
Admission: RE | Admit: 2011-06-28 | Discharge: 2011-06-28 | Disposition: A | Discharge status: Home / Self Care | Payer: Medicare Other | Source: Ambulatory Visit | Attending: Internal Medicine | Admitting: Internal Medicine

## 2011-06-28 ENCOUNTER — Encounter: Payer: Self-pay | Admitting: Internal Medicine

## 2011-06-28 ENCOUNTER — Ambulatory Visit (INDEPENDENT_AMBULATORY_CARE_PROVIDER_SITE_OTHER): Payer: Medicare Other | Admitting: Internal Medicine

## 2011-06-28 VITALS — BP 130/72 | HR 86 | Temp 97.7°F | Resp 16 | Wt 240.0 lb

## 2011-06-28 DIAGNOSIS — F411 Generalized anxiety disorder: Secondary | ICD-10-CM

## 2011-06-28 DIAGNOSIS — M542 Cervicalgia: Secondary | ICD-10-CM | POA: Diagnosis not present

## 2011-06-28 DIAGNOSIS — R0602 Shortness of breath: Secondary | ICD-10-CM | POA: Diagnosis not present

## 2011-06-28 DIAGNOSIS — F419 Anxiety disorder, unspecified: Secondary | ICD-10-CM

## 2011-06-28 DIAGNOSIS — J45909 Unspecified asthma, uncomplicated: Secondary | ICD-10-CM

## 2011-06-28 DIAGNOSIS — I10 Essential (primary) hypertension: Secondary | ICD-10-CM

## 2011-06-28 DIAGNOSIS — R05 Cough: Secondary | ICD-10-CM | POA: Diagnosis not present

## 2011-06-28 DIAGNOSIS — J309 Allergic rhinitis, unspecified: Secondary | ICD-10-CM

## 2011-06-28 DIAGNOSIS — R937 Abnormal findings on diagnostic imaging of other parts of musculoskeletal system: Secondary | ICD-10-CM

## 2011-06-28 DIAGNOSIS — M4310 Spondylolisthesis, site unspecified: Secondary | ICD-10-CM

## 2011-06-28 DIAGNOSIS — M4312 Spondylolisthesis, cervical region: Secondary | ICD-10-CM

## 2011-06-28 DIAGNOSIS — R1310 Dysphagia, unspecified: Secondary | ICD-10-CM | POA: Diagnosis not present

## 2011-06-28 MED ORDER — TELMISARTAN 40 MG PO TABS: 40.0000 mg | ORAL_TABLET | Freq: Every day | ORAL | Status: DC

## 2011-06-28 MED ORDER — METHYLPREDNISOLONE ACETATE 80 MG/ML IJ SUSP
120.0000 mg | Freq: Once | INTRAMUSCULAR | Status: AC
  Administered 2011-06-28: 120 mg via INTRAMUSCULAR

## 2011-06-28 MED ORDER — MOMETASONE FURO-FORMOTEROL FUM 100-5 MCG/ACT IN AERO: 2.0000 | INHALATION_SPRAY | Freq: Two times a day (BID) | RESPIRATORY_TRACT | Status: DC

## 2011-06-28 MED ORDER — ALPRAZOLAM 0.5 MG PO TABS: 0.5000 mg | ORAL_TABLET | Freq: Three times a day (TID) | ORAL | Status: DC | PRN

## 2011-06-28 NOTE — Progress Notes (Signed)
Subjective:    Patient ID: [name], female    DOB: 1940-08-12, 71 y.o.   MRN: [id number]  Hypertension This is a chronic problem. The current episode started more than 1 year ago. The problem has been gradually improving since onset. The problem is controlled. Associated symptoms include neck pain (diffuse soreness s/p surgery 4 weeks ago). Pertinent negatives include no anxiety, blurred vision, chest pain, headaches, malaise/fatigue, orthopnea, palpitations, peripheral edema, PND, shortness of breath or sweats. Past treatments include diuretics, ACE inhibitors and calcium channel blockers. The current treatment provides moderate improvement. Compliance problems include medication side effects, exercise and diet.   Cough This is a recurrent problem. The current episode started more than 1 month ago. The problem has been gradually worsening. The problem occurs every few hours. The cough is non-productive. Associated symptoms include ear congestion, nasal congestion, postnasal drip, rhinorrhea and wheezing. Pertinent negatives include no chest pain, chills, ear pain, fever, headaches, heartburn, hemoptysis, myalgias, rash, sore throat, shortness of breath, sweats or weight loss. She has tried a beta-agonist inhaler for the symptoms. The treatment provided mild relief. Her past medical history is significant for asthma and environmental allergies.      Review of Systems  Constitutional: Negative for fever, chills, weight loss, malaise/fatigue, diaphoresis, activity change, appetite change, fatigue and unexpected weight change.  HENT: Positive for congestion, rhinorrhea, sneezing, neck pain (diffuse soreness s/p surgery 4 weeks ago), neck stiffness and postnasal drip. Negative for hearing loss, ear pain, nosebleeds, sore throat, facial swelling, drooling, mouth sores, trouble swallowing, dental problem, voice change, sinus pressure, tinnitus and ear discharge.   Eyes: Negative.  Negative for  blurred vision.  Respiratory: Positive for cough, choking and wheezing. Negative for apnea, hemoptysis, chest tightness, shortness of breath and stridor.   Cardiovascular: Negative for chest pain, palpitations, orthopnea, leg swelling and PND.  Gastrointestinal: Negative for heartburn, nausea, vomiting, abdominal pain, diarrhea and constipation.  Genitourinary: Negative.   Musculoskeletal: Negative for myalgias, back pain, joint swelling, arthralgias and gait problem.  Skin: Negative for color change, pallor, rash and wound.  Neurological: Negative for dizziness, tremors, seizures, syncope, facial asymmetry, speech difficulty, weakness, light-headedness, numbness and headaches.  Hematological: Positive for environmental allergies. Negative for adenopathy. Does not bruise/bleed easily.  Psychiatric/Behavioral: Negative for suicidal ideas, hallucinations, behavioral problems, confusion, sleep disturbance, self-injury, dysphoric mood, decreased concentration and agitation. The patient is nervous/anxious. The patient is not hyperactive.        Objective:   Physical Exam  Vitals reviewed. Constitutional: She is oriented to person, place, and time. She appears well-developed and well-nourished. No distress.  HENT:  Head: Normocephalic and atraumatic. No trismus in the jaw.  Right Ear: Hearing, tympanic membrane, external ear and ear canal normal.  Left Ear: Hearing, tympanic membrane, external ear and ear canal normal.  Nose: Mucosal edema present. No rhinorrhea, nose lacerations, sinus tenderness, nasal deformity, septal deviation or nasal septal hematoma. No epistaxis.  No foreign bodies. Right sinus exhibits no maxillary sinus tenderness and no frontal sinus tenderness. Left sinus exhibits no maxillary sinus tenderness and no frontal sinus tenderness.  Mouth/Throat: Oropharynx is clear and moist and mucous membranes are normal. Mucous membranes are not pale, not dry and not cyanotic. No uvula  swelling. No oropharyngeal exudate, posterior oropharyngeal edema, posterior oropharyngeal erythema or tonsillar abscesses.  Eyes: Conjunctivae are normal. Right eye exhibits no discharge. Left eye exhibits no discharge. No scleral icterus.  Neck: Normal range of motion. Neck supple. No JVD present. No tracheal deviation  present. No thyromegaly present.  Cardiovascular: Normal rate, regular rhythm, normal heart sounds and intact distal pulses.  Exam reveals no gallop and no friction rub.   No murmur heard. Pulmonary/Chest: Effort normal and breath sounds normal. No stridor. No respiratory distress. She has no wheezes. She has no rales. She exhibits no tenderness.  Abdominal: Soft. Bowel sounds are normal. She exhibits no distension and no mass. There is no tenderness. There is no rebound and no guarding.  Musculoskeletal: Normal range of motion. She exhibits no edema and no tenderness.       Cervical back: Normal. She exhibits normal range of motion, no tenderness, no bony tenderness, no swelling, no edema, no deformity, no laceration, no pain, no spasm and normal pulse.  Lymphadenopathy:    She has no cervical adenopathy.  Neurological: She is oriented to person, place, and time.  Skin: Skin is warm and dry. No rash noted. She is not diaphoretic. No erythema. No pallor.  Psychiatric: She has a normal mood and affect. Her behavior is normal. Judgment and thought content normal.      Lab Results  Component Value Date   WBC 10.3 06/07/2011   HGB 15.1* 06/07/2011   HCT 45.2 06/07/2011   PLT 288 06/07/2011   GLUCOSE 125* 06/08/2011   CHOL 212* 04/24/2010   TRIG 239.0* 04/24/2010   HDL 60.10 04/24/2010   LDLDIRECT 130.6 04/24/2010   LDLCALC  Value: 132        Total Cholesterol/HDL:CHD Risk Coronary Heart Disease Risk Table                     Men   Women  1/2 Average Risk   3.4   3.3  Average Risk       5.0   4.4  2 X Average Risk   9.6   7.1  3 X Average Risk  23.4   11.0        Use the calculated  Patient Ratio above and the CHD Risk Table to determine the patient's CHD Risk.        ATP III CLASSIFICATION (LDL):  <100     mg/dL   Optimal  130-865  mg/dL   Near or Above                    Optimal  130-159  mg/dL   Borderline  784-696  mg/dL   High  >295     mg/dL   Very High* 2/84/1324   ALT 18 06/07/2011   AST 22 06/07/2011   NA 141 06/08/2011   K 3.8 06/08/2011   CL 102 06/08/2011   CREATININE 0.93 06/08/2011   BUN 16 06/08/2011   CO2 26 06/08/2011   TSH 1.779 09/09/2010   INR 0.94 06/07/2011   HGBA1C 7.8* 11/18/2010   MICROALBUR 15.0* 12/25/2008      Assessment & Plan:

## 2011-06-28 NOTE — Assessment & Plan Note (Signed)
I have treated the asthma, will check her CXR to look for mass/pna, she will stop the ACEI therapy

## 2011-06-28 NOTE — Assessment & Plan Note (Signed)
I will check a plain xray of her neck to see if there are any signs of a post-surgical complication

## 2011-06-28 NOTE — Assessment & Plan Note (Signed)
She appears to be having a flare of asthma so I gave her an injection of depo-medrol and I have asked her to start using dulera

## 2011-06-28 NOTE — Assessment & Plan Note (Signed)
Depo-medrol IM today 

## 2011-06-28 NOTE — Assessment & Plan Note (Signed)
I think her ACEI therapy is causing a cough so I have changed her BP med to [employer]

## 2011-06-28 NOTE — Assessment & Plan Note (Signed)
Late additional note, her cervical spine xray shows some sts so I called her and asked her to get a CT scan done asap, she did not answer so I left a message, I told her if she felt poorly she should go in to the ER tonight but if was not in much distress then she could call os tomorrow to get the scan done, at the time of this note she has not called back

## 2011-06-28 NOTE — Progress Notes (Signed)
Addended by: Etta Grandchild on: 06/28/2011 05:16 PM   Modules accepted: Orders

## 2011-06-28 NOTE — Patient Instructions (Signed)
Hypertension As your heart beats, it forces blood through your arteries. This force is your blood pressure. If the pressure is too high, it is called hypertension (HTN) or high blood pressure. HTN is dangerous because you may have it and not know it. High blood pressure may mean that your heart has to work harder to pump blood. Your arteries may be narrow or stiff. The extra work puts you at risk for heart disease, stroke, and other problems.  Blood pressure consists of two numbers, a higher number over a lower, 110/72, for example. It is stated as "110 over 72." The ideal is below 120 for the top number (systolic) and under 80 for the bottom (diastolic). Write down your blood pressure today. You should pay close attention to your blood pressure if you have certain conditions such as:  Heart failure.   Prior heart attack.   Diabetes   Chronic kidney disease.   Prior stroke.   Multiple risk factors for heart disease.  To see if you have HTN, your blood pressure should be measured while you are seated with your arm held at the level of the heart. It should be measured at least twice. A one-time elevated blood pressure reading (especially in the Emergency Department) does not mean that you need treatment. There may be conditions in which the blood pressure is different between your right and left arms. It is important to see your caregiver soon for a recheck. Most people have essential hypertension which means that there is not a specific cause. This type of high blood pressure may be lowered by changing lifestyle factors such as:  Stress.   Smoking.   Lack of exercise.   Excessive weight.   Drug/tobacco/alcohol use.   Eating less salt.  Most people do not have symptoms from high blood pressure until it has caused damage to the body. Effective treatment can often prevent, delay or reduce that damage. TREATMENT  When a cause has been identified, treatment for high blood pressure is  directed at the cause. There are a large number of medications to treat HTN. These fall into several categories, and your caregiver will help you select the medicines that are best for you. Medications may have side effects. You should review side effects with your caregiver. If your blood pressure stays high after you have made lifestyle changes or started on medicines,   Your medication(s) may need to be changed.   Other problems may need to be addressed.   Be certain you understand your prescriptions, and know how and when to take your medicine.   Be sure to follow up with your caregiver within the time frame advised (usually within two weeks) to have your blood pressure rechecked and to review your medications.   If you are taking more than one medicine to lower your blood pressure, make sure you know how and at what times they should be taken. Taking two medicines at the same time can result in blood pressure that is too low.  SEEK IMMEDIATE MEDICAL CARE IF:  You develop a severe headache, blurred or changing vision, or confusion.   You have unusual weakness or numbness, or a faint feeling.   You have severe chest or abdominal pain, vomiting, or breathing problems.  MAKE SURE YOU:   Understand these instructions.   Will watch your condition.   Will get help right away if you are not doing well or get worse.  Document Released: 02/08/2005 Document Revised: 01/28/2011 Document Reviewed:   09/29/2007 [employer] Patient Information 2012 [place], [place].Cough, Adult  A cough is a reflex that helps clear your throat and airways. It can help heal the body or may be a reaction to an irritated airway. A cough may only last 2 or 3 weeks (acute) or may last more than 8 weeks (chronic).  CAUSES Acute cough:  Viral or bacterial infections.  Chronic cough:  Infections.   Allergies.   Asthma.   Post-nasal drip.   Smoking.   Heartburn or acid reflux.   Some medicines.   Chronic lung  problems (COPD).   Cancer.  SYMPTOMS   Cough.   Fever.   Chest pain.   Increased breathing rate.   High-pitched whistling sound when breathing (wheezing).   Colored mucus that you cough up (sputum).  TREATMENT   A bacterial cough may be treated with antibiotic medicine.   A viral cough must run its course and will not respond to antibiotics.   Your caregiver may recommend other treatments if you have a chronic cough.  HOME CARE INSTRUCTIONS   Only take over-the-counter or prescription medicines for pain, discomfort, or fever as directed by your caregiver. Use cough suppressants only as directed by your caregiver.   Use a cold steam vaporizer or humidifier in your bedroom or home to help loosen secretions.   Sleep in a semi-upright position if your cough is worse at night.   Rest as needed.   Stop smoking if you smoke.  SEEK IMMEDIATE MEDICAL CARE IF:   You have pus in your sputum.   Your cough starts to worsen.   You cannot control your cough with suppressants and are losing sleep.   You begin coughing up blood.   You have difficulty breathing.   You develop pain which is getting worse or is uncontrolled with medicine.   You have a fever.  MAKE SURE YOU:   Understand these instructions.   Will watch your condition.   Will get help right away if you are not doing well or get worse.  Document Released: 08/07/2010 Document Revised: 01/28/2011 Document Reviewed: 08/07/2010 [facility] Patient Information 2012 [place], [place].

## 2011-06-28 NOTE — Assessment & Plan Note (Signed)
Refill of xanax at her request

## 2011-06-29 ENCOUNTER — Ambulatory Visit (INDEPENDENT_AMBULATORY_CARE_PROVIDER_SITE_OTHER): Payer: Medicare Other

## 2011-06-29 ENCOUNTER — Ambulatory Visit (INDEPENDENT_AMBULATORY_CARE_PROVIDER_SITE_OTHER): Payer: Medicare Other | Admitting: Cardiology

## 2011-06-29 ENCOUNTER — Telehealth: Payer: Self-pay

## 2011-06-29 VITALS — BP 144/82 | HR 93 | Ht 63.5 in | Wt 241.4 lb

## 2011-06-29 DIAGNOSIS — Z7901 Long term (current) use of anticoagulants: Secondary | ICD-10-CM | POA: Diagnosis not present

## 2011-06-29 DIAGNOSIS — I10 Essential (primary) hypertension: Secondary | ICD-10-CM | POA: Diagnosis not present

## 2011-06-29 DIAGNOSIS — I4891 Unspecified atrial fibrillation: Secondary | ICD-10-CM

## 2011-06-29 DIAGNOSIS — M542 Cervicalgia: Secondary | ICD-10-CM

## 2011-06-29 DIAGNOSIS — M4310 Spondylolisthesis, site unspecified: Secondary | ICD-10-CM | POA: Diagnosis not present

## 2011-06-29 DIAGNOSIS — M4312 Spondylolisthesis, cervical region: Secondary | ICD-10-CM

## 2011-06-29 DIAGNOSIS — E785 Hyperlipidemia, unspecified: Secondary | ICD-10-CM | POA: Diagnosis not present

## 2011-06-29 DIAGNOSIS — R937 Abnormal findings on diagnostic imaging of other parts of musculoskeletal system: Secondary | ICD-10-CM

## 2011-06-29 LAB — BASIC METABOLIC PANEL
Calcium: 9.4 mg/dL (ref 8.4–10.5)
GFR: 56.43 mL/min — ABNORMAL LOW (ref >60.00)
Glucose, Bld: 171 mg/dL — ABNORMAL HIGH (ref 70–99)
Potassium: 4 mEq/L (ref 3.5–5.1)
Sodium: 139 mEq/L (ref 135–145)

## 2011-06-29 LAB — POCT INR: INR: 1.4

## 2011-06-29 NOTE — Patient Instructions (Addendum)
Your physician recommends that you have lab work today: [facility]  Your physician recommends that you continue on your current medications as directed. Please refer to the Current Medication list given to you today.  Your physician recommends that you schedule a follow-up appointment in: 1 MONTH with Dr Riley Kill  See the Coumadin Clinic today.

## 2011-06-29 NOTE — Progress Notes (Signed)
HPI: [name] came in for followup. She's had a rough course recently. Gen. emergency room with atrial fibrillation. She was placed on IV diltiazem drip, and converted spontaneously. She was kept overnight and subsequently discharged. She, however from, and she is convinced that that may have something to do with it. This was in anticipation of her neurosurgery. She underwent back. She saw Dr. Yetta Barre yesterday the patient is complaining of a feeling in her throat when she swallows it more difficult. She was given a dose of steroids yesterday and and some studies were done including neck xrays so that a CT has been set up.  She was restarted on Coumadin on May 3, after she had seen Dr. Her surgery was done on April 19. She denies any current chest pain. Since she was in the emergency room she has not been out of rhythm.   Current Outpatient Prescriptions  Medication Sig Dispense Refill  . albuterol (PROVENTIL HFA;VENTOLIN HFA) 108 (90 BASE) MCG/ACT inhaler Inhale 2 puffs into the lungs every 6 (six) hours as needed. Shortness of breath      . ALPRAZolam (XANAX) 0.5 MG tablet Take 1 tablet (0.5 mg total) by mouth 3 (three) times daily as needed for sleep or anxiety. Take 1 tab every 6hrs prn  50 tablet  1  . colchicine 0.6 MG tablet Take 0.6 mg by mouth daily as needed. gout      . diltiazem (CARDIZEM CD) 240 MG 24 hr capsule Take 240 mg by mouth daily.        [name] docusate sodium (COLACE) 100 MG capsule Take 100 mg by mouth 2 (two) times daily as needed. For constipation.      . furosemide (LASIX) 20 MG tablet Take 20 mg by mouth daily.      [name] glimepiride (AMARYL) 4 MG tablet Take 4 mg by mouth daily before breakfast.      . loperamide (IMODIUM) 2 MG capsule Take 2 mg by mouth 4 (four) times daily as needed. Loose stools      . mometasone-formoterol (DULERA) 100-5 MCG/ACT AERO Inhale 2 puffs into the lungs 2 (two) times daily.  1 Inhaler  0  . promethazine (PHENERGAN) 25 MG tablet Take 1 tablet (25 mg  total) by mouth every 6 (six) hours as needed. Only takes for severe nausea  60 tablet  3  . telmisartan (MICARDIS) 40 MG tablet Take 1 tablet (40 mg total) by mouth daily.  56 tablet  0  . warfarin (COUMADIN) 5 MG tablet Discuss holding this medication with your surgeon. Take 5 mg on Sunday & Thursday; take 2.5 mg [0.5 tab] all other days of week.      [name] oxyCODONE-acetaminophen (PERCOCET) 5-325 MG per tablet Take 1 tablet by mouth every 4 (four) hours as needed. pain  90 tablet  0  . DISCONTD: digoxin (LANOXIN) 0.125 MG tablet Take 125 mcg by mouth daily.       Current Facility-Administered Medications  Medication Dose Route Frequency Provider Last Rate Last Dose  . methylPREDNISolone acetate (DEPO-MEDROL) injection 120 mg  120 mg Intramuscular Once Etta Grandchild, MD   120 mg at 06/28/11 1138    Allergies  Allergen Reactions  . Adhesive (Tape) Other (See Comments)    redness  . Benazepril   . Morphine Other (See Comments)    REACTION: Reaction not  Makes you feel bad all over  . Nifedipine Hives and Itching  . Piroxicam Hives and Itching  Past Medical History  Diagnosis Date  . Allergic rhinitis   . Asthma   . Gout     takes Colchicine prn  . Peptic ulcer disease   . History of PSVT (paroxysmal supraventricular tachycardia)   . Hx of colonoscopy   . HLD (hyperlipidemia)     pt doesn't take any medication for this  . DJD (degenerative joint disease)   . History of blood clots     right lung in early 90's  . Sleep apnea     doesn't use CPAP  . Shortness of breath     pt states that she can be sitting as well as exertion and get short of breath  . Bronchitis     in Jan 2013-uses Albuterol prn  . History of migraine headaches     last migraine 12yrs ago;Pt states she does get sinus headaches  . Fibromyalgia   . Osteoarthritis   . Chronic back pain     hx buldging disc  . Rash     on neck and started after she started taking her beta blocker 29yrs ago;medical MD has  seen this  . GERD (gastroesophageal reflux disease)     uses Rolaids prn  . Gastric ulcer   . Hemorrhoids   . Family hx of colon cancer   . Diabetes mellitus     glimepiride  . Yeast infection of the vagina     being treated for this now by dr.fontaine  . Spinal headache     after 2nd child  . Bronchitis, acute 03-01-11    FINSHED Z-PAK-STILL ON PREDNISONE NOW  . Hypertension     takes Benazepril and Diltiazem daily  . Atrial fibrillation     has been off of coumadin since 06/02/11 d/t surgery  . Peripheral edema     takes Furosemide every other day  . Dizziness     occasionally  . Constipation     takes Colace prn  . Colon cancer 2007    s/p surgery and chemo  . Colon cancer   . Urinary frequency   . Urinary urgency   . Nocturia   . Anxiety     and panic attacks;pt states that she is claustrophobic    Past Surgical History  Procedure Date  . Caesarean section 1966/69/72    x 3  . Heel spur surgery 1992    x 2  . Ankle surgery     tumor-benign removed left ankle  . Lumbar disc surgery 2008  . Colectomy 2007    colon cancer  . Pyelonidal cystectomy     at age 68  . Knee arthrosocpy     bil;couple of years before knee replacement  . Ganglion cyst excision > 65yrs ago    removed from left pointer finger  . Cataract surgery 2010  . Tubal ligation 1972  . Cardiac catheterization 90's and 2005  . Port a cath placed 2008  . Port a cath removed 2008  . Knee surgery     left TKA  . Anterior cervical decomp/discectomy fusion 01/12/2011    Procedure: ANTERIOR CERVICAL DECOMPRESSION/DISCECTOMY FUSION 2 LEVELS;  Surgeon: Kathaleen Maser Pool;  Location: [facility];  Service: Neurosurgery;  Laterality: Bilateral;  Cervical five-six, six-seven anterior cervical discectomy and fusion with allograft and plating  . Hysteroscopy w/d&c 02/19/2011    Procedure: DILATATION AND CURETTAGE /HYSTEROSCOPY;  Surgeon: Dara Lords, MD;  Location: [facility];  Service: Gynecology;   Laterality: N/A;  requests  one hour  . Dilation and curettage of uterus 02-19-11    & POLYP REMOVAL  . Anterior cervical decomp/discectomy fusion 06/11/2011    Procedure: ANTERIOR CERVICAL DECOMPRESSION/DISCECTOMY FUSION 1 LEVEL/HARDWARE REMOVAL;  Surgeon: Temple Pacini, MD;  Location: [facility];  Service: Neurosurgery;  Laterality: N/A;  Cervical four-five anterior cervical decompression fusion with allograft and plating; Removal of Cervical five to seven plate    Family History  Problem Relation Age of Onset  . Coronary artery disease Other   . Heart attack Mother   . Stroke Mother   . Hypertension Mother   . Heart disease Father   . Hypertension Sister   . Cancer Sister     left kidney cancer  . Cancer Brother     bladder  . Cancer Brother     liver, spread to colon  . Liver cancer Brother   . Anesthesia problems Neg Hx   . Hypotension Neg Hx   . Malignant hyperthermia Neg Hx   . Pseudochol deficiency Neg Hx     History   Social History  . Marital Status: Married    Spouse Name: N/A    Number of Children: N/A  . Years of Education: N/A   Occupational History  . retired    Social History Main Topics  . Smoking status: Former Smoker -- 1.5 packs/day for 30 years    Types: Cigarettes  . Smokeless tobacco: Never Used   Comment: quit 19 years ago  . Alcohol Use: No  . Drug Use: No  . Sexually Active: Yes -- Female partner(s)    Birth Control/ Protection: Post-menopausal   Other Topics Concern  . Not on file   Social History Narrative   Regular exercise- no.    ROS: Please see the HPI.  All other systems reviewed and negative.  PHYSICAL EXAM:  BP 144/82  Pulse 93  Ht 5' 3.5" (1.613 m)  Wt 241 lb 6.4 oz (109.498 kg)  BMI 42.09 kg/m2  LMP 12/30/2000  General: Well developed, well nourished, in no acute distress. Head:  Normocephalic and atraumatic. Neck: no JVD Lungs: Clear to auscultation and percussion. Heart: Normal S1 and S2.  No murmur, rubs or  gallops.  Abdomen:  Normal bowel sounds; soft; non tender; no organomegaly Pulses: Pulses normal in all 4 extremities. Extremities: No clubbing or cyanosis. No edema. Neurologic: Alert and oriented x 3.  EKG:  SNr.  Cannot exclude inferior MI, old.  No acute changes.    ASSESSMENT AND PLAN:  1.  atrial fibrillation-she may eventually require rhythm controlling drugs. At the present time she is on rate control for intermittent episodes. She's been restarted back on warfarin, and we'll get a protime today.  2. Swallowing difficulties.-She is being seen by a Dr. Yetta Barre, and apparently a CT scan is in the works for today. 3.  Hypertension-she takes intermittent furosemide. She is also been switched from an ACE inhibitor to Micardis. She's also on diltiazem. We'll get labs today.  I will see her back in followup approximately one month for continued observation of her atrial fibrillation.

## 2011-06-29 NOTE — Telephone Encounter (Signed)
Patient called [facility] stating that she was seen 06/28/11 and advised that a CT would be ordered. Patient request to have an MRI instead due to closthrophia.

## 2011-06-30 ENCOUNTER — Encounter: Payer: Self-pay | Admitting: Internal Medicine

## 2011-06-30 ENCOUNTER — Ambulatory Visit (INDEPENDENT_AMBULATORY_CARE_PROVIDER_SITE_OTHER)
Admission: RE | Admit: 2011-06-30 | Discharge: 2011-06-30 | Disposition: A | Discharge status: Home / Self Care | Payer: Medicare Other | Source: Ambulatory Visit | Attending: Internal Medicine | Admitting: Internal Medicine

## 2011-06-30 DIAGNOSIS — M542 Cervicalgia: Secondary | ICD-10-CM

## 2011-06-30 DIAGNOSIS — R220 Localized swelling, mass and lump, head: Secondary | ICD-10-CM | POA: Diagnosis not present

## 2011-06-30 DIAGNOSIS — R937 Abnormal findings on diagnostic imaging of other parts of musculoskeletal system: Secondary | ICD-10-CM | POA: Diagnosis not present

## 2011-06-30 DIAGNOSIS — M4312 Spondylolisthesis, cervical region: Secondary | ICD-10-CM | POA: Insufficient documentation

## 2011-06-30 DIAGNOSIS — R221 Localized swelling, mass and lump, neck: Secondary | ICD-10-CM | POA: Diagnosis not present

## 2011-06-30 MED ORDER — IOHEXOL 300 MG/ML  SOLN
75.0000 mL | Freq: Once | INTRAMUSCULAR | Status: AC | PRN
  Administered 2011-06-30: 75 mL via INTRAVENOUS

## 2011-06-30 NOTE — Telephone Encounter (Signed)
Patient already had CT done today

## 2011-06-30 NOTE — Progress Notes (Signed)
Addended by: Etta Grandchild on: 06/30/2011 01:04 PM   Modules accepted: Orders

## 2011-06-30 NOTE — Telephone Encounter (Signed)
MRI ordered

## 2011-07-06 ENCOUNTER — Ambulatory Visit (INDEPENDENT_AMBULATORY_CARE_PROVIDER_SITE_OTHER): Payer: Medicare Other | Admitting: *Deleted

## 2011-07-06 DIAGNOSIS — I4891 Unspecified atrial fibrillation: Secondary | ICD-10-CM | POA: Diagnosis not present

## 2011-07-06 DIAGNOSIS — Z7901 Long term (current) use of anticoagulants: Secondary | ICD-10-CM | POA: Diagnosis not present

## 2011-07-08 NOTE — Assessment & Plan Note (Signed)
See neurosurgery back.

## 2011-07-08 NOTE — Assessment & Plan Note (Signed)
I will readdress when I see her back in one month.

## 2011-07-08 NOTE — Assessment & Plan Note (Signed)
See encounter note.

## 2011-07-15 DIAGNOSIS — M5020 Other cervical disc displacement, unspecified cervical region: Secondary | ICD-10-CM | POA: Diagnosis not present

## 2011-07-22 ENCOUNTER — Ambulatory Visit (INDEPENDENT_AMBULATORY_CARE_PROVIDER_SITE_OTHER): Payer: Medicare Other | Admitting: *Deleted

## 2011-07-22 DIAGNOSIS — I4891 Unspecified atrial fibrillation: Secondary | ICD-10-CM

## 2011-07-22 DIAGNOSIS — Z7901 Long term (current) use of anticoagulants: Secondary | ICD-10-CM | POA: Diagnosis not present

## 2011-07-22 LAB — POCT INR: INR: 1.7

## 2011-07-26 ENCOUNTER — Other Ambulatory Visit: Payer: Self-pay

## 2011-07-26 DIAGNOSIS — I10 Essential (primary) hypertension: Secondary | ICD-10-CM

## 2011-07-26 MED ORDER — TELMISARTAN 40 MG PO TABS: 40.0000 mg | ORAL_TABLET | Freq: Every day | ORAL | Status: DC

## 2011-07-29 ENCOUNTER — Other Ambulatory Visit: Payer: Self-pay

## 2011-07-29 DIAGNOSIS — I10 Essential (primary) hypertension: Secondary | ICD-10-CM

## 2011-07-29 MED ORDER — TELMISARTAN 40 MG PO TABS: 40.0000 mg | ORAL_TABLET | Freq: Every day | ORAL | Status: DC

## 2011-08-05 ENCOUNTER — Ambulatory Visit (INDEPENDENT_AMBULATORY_CARE_PROVIDER_SITE_OTHER): Payer: Medicare Other | Admitting: *Deleted

## 2011-08-05 DIAGNOSIS — Z7901 Long term (current) use of anticoagulants: Secondary | ICD-10-CM | POA: Diagnosis not present

## 2011-08-05 DIAGNOSIS — I4891 Unspecified atrial fibrillation: Secondary | ICD-10-CM | POA: Diagnosis not present

## 2011-08-05 LAB — POCT INR: INR: 2.7

## 2011-08-16 ENCOUNTER — Ambulatory Visit (INDEPENDENT_AMBULATORY_CARE_PROVIDER_SITE_OTHER): Payer: Medicare Other | Admitting: Cardiology

## 2011-08-16 ENCOUNTER — Encounter: Payer: Self-pay | Admitting: Cardiology

## 2011-08-16 VITALS — BP 144/72 | HR 68 | Ht 63.5 in | Wt 247.0 lb

## 2011-08-16 DIAGNOSIS — I4891 Unspecified atrial fibrillation: Secondary | ICD-10-CM

## 2011-08-16 DIAGNOSIS — I10 Essential (primary) hypertension: Secondary | ICD-10-CM | POA: Diagnosis not present

## 2011-08-16 DIAGNOSIS — E7800 Pure hypercholesterolemia, unspecified: Secondary | ICD-10-CM | POA: Diagnosis not present

## 2011-08-16 DIAGNOSIS — E785 Hyperlipidemia, unspecified: Secondary | ICD-10-CM | POA: Diagnosis not present

## 2011-08-16 LAB — LIPID PANEL
HDL: 67.6 mg/dL (ref >39.00)
VLDL: 62.6 mg/dL — ABNORMAL HIGH (ref 0.0–40.0)

## 2011-08-16 LAB — BASIC METABOLIC PANEL
Chloride: 105 mEq/L (ref 96–112)
GFR: 64.27 mL/min (ref >60.00)
Glucose, Bld: 120 mg/dL — ABNORMAL HIGH (ref 70–99)
Potassium: 3.7 mEq/L (ref 3.5–5.1)
Sodium: 142 mEq/L (ref 135–145)

## 2011-08-16 LAB — LDL CHOLESTEROL, DIRECT: Direct LDL: 132.8 mg/dL

## 2011-08-16 NOTE — Assessment & Plan Note (Signed)
Meds were switched to an ARB.  I discussed with her the current controversy regarding ARB drugs.  Will get BMET since this has yet to be checked on Micardis.

## 2011-08-16 NOTE — Assessment & Plan Note (Signed)
The patient remains on medical therapy. She's on diltiazem at the present time. She is also on warfarin. She's been followed in the Coumadin clinic.

## 2011-08-16 NOTE — Assessment & Plan Note (Signed)
Will recheck lipid and liver profile 

## 2011-08-16 NOTE — Patient Instructions (Addendum)
Your physician recommends that you have lab work today: BMP and LIPID  Your physician recommends that you continue on your current medications as directed. Please refer to the Current Medication list given to you today.  Your physician recommends that you schedule a follow-up appointment in: 3 MONTHS

## 2011-08-16 NOTE — Progress Notes (Signed)
HPI:  The patient is doing well overall. She was able to get through another operation without too much difficulty. She denies any current chest pain. She back on her warfarin at the present time.  Current Outpatient Prescriptions  Medication Sig Dispense Refill  . albuterol (PROVENTIL HFA;VENTOLIN HFA) 108 (90 BASE) MCG/ACT inhaler Inhale 2 puffs into the lungs every 6 (six) hours as needed. Shortness of breath      . ALPRAZolam (XANAX) 0.5 MG tablet Take 1 tablet (0.5 mg total) by mouth 3 (three) times daily as needed for sleep or anxiety. Take 1 tab every 6hrs prn  50 tablet  1  . colchicine 0.6 MG tablet Take 0.6 mg by mouth daily as needed. gout      . diltiazem (CARDIZEM CD) 240 MG 24 hr capsule Take 240 mg by mouth daily.        [name] glimepiride (AMARYL) 4 MG tablet Take 2 mg by mouth daily before breakfast.      . loperamide (IMODIUM) 2 MG capsule Take 2 mg by mouth 4 (four) times daily as needed. Loose stools      . oxyCODONE-acetaminophen (PERCOCET) 5-325 MG per tablet Take 1 tablet by mouth every 4 (four) hours as needed. pain  90 tablet  0  . promethazine (PHENERGAN) 25 MG tablet Take 1 tablet (25 mg total) by mouth every 6 (six) hours as needed. Only takes for severe nausea  60 tablet  3  . telmisartan (MICARDIS) 40 MG tablet Take 1 tablet (40 mg total) by mouth daily.  30 tablet  5  . warfarin (COUMADIN) 5 MG tablet 5 mg. Discuss holding this medication with your surgeon. Take 5 mg on Sunday,Tuesday & Thursday; take 2.5 mg [0.5 tab] all other days of week.       [name] DISCONTD: digoxin (LANOXIN) 0.125 MG tablet Take 125 mcg by mouth daily.      [name] DISCONTD: warfarin (COUMADIN) 5 MG tablet Discuss holding this medication with your surgeon. Take 5 mg on Sunday & Thursday; take 2.5 mg [0.5 tab] all other days of week.        Allergies  Allergen Reactions  . Adhesive (Tape) Other (See Comments)    redness  . Benazepril   . Morphine Other (See Comments)    REACTION: Reaction not    Makes you feel bad all over  . Nifedipine Hives and Itching  . Piroxicam Hives and Itching    Past Medical History  Diagnosis Date  . Allergic rhinitis   . Asthma   . Gout     takes Colchicine prn  . Peptic ulcer disease   . History of PSVT (paroxysmal supraventricular tachycardia)   . Hx of colonoscopy   . HLD (hyperlipidemia)     pt doesn't take any medication for this  . DJD (degenerative joint disease)   . History of blood clots     right lung in early 90's  . Sleep apnea     doesn't use CPAP  . Shortness of breath     pt states that she can be sitting as well as exertion and get short of breath  . Bronchitis     in Jan 2013-uses Albuterol prn  . History of migraine headaches     last migraine 41yrs ago;Pt states she does get sinus headaches  . Fibromyalgia   . Osteoarthritis   . Chronic back pain     hx buldging disc  . Rash  on neck and started after she started taking her beta blocker 46yrs ago;medical MD has seen this  . GERD (gastroesophageal reflux disease)     uses Rolaids prn  . Gastric ulcer   . Hemorrhoids   . Family hx of colon cancer   . Diabetes mellitus     glimepiride  . Yeast infection of the vagina     being treated for this now by dr.fontaine  . Spinal headache     after 2nd child  . Bronchitis, acute 03-01-11    FINSHED Z-PAK-STILL ON PREDNISONE NOW  . Hypertension     takes Benazepril and Diltiazem daily  . Atrial fibrillation     has been off of coumadin since 06/02/11 d/t surgery  . Peripheral edema     takes Furosemide every other day  . Dizziness     occasionally  . Constipation     takes Colace prn  . Colon cancer 2007    s/p surgery and chemo  . Colon cancer   . Urinary frequency   . Urinary urgency   . Nocturia   . Anxiety     and panic attacks;pt states that she is claustrophobic    Past Surgical History  Procedure Date  . Caesarean section 1966/69/72    x 3  . Heel spur surgery 1992    x 2  . Ankle surgery      tumor-benign removed left ankle  . Lumbar disc surgery 2008  . Colectomy 2007    colon cancer  . Pyelonidal cystectomy     at age 24  . Knee arthrosocpy     bil;couple of years before knee replacement  . Ganglion cyst excision > 67yrs ago    removed from left pointer finger  . Cataract surgery 2010  . Tubal ligation 1972  . Cardiac catheterization 90's and 2005  . Port a cath placed 2008  . Port a cath removed 2008  . Knee surgery     left TKA  . Anterior cervical decomp/discectomy fusion 01/12/2011    Procedure: ANTERIOR CERVICAL DECOMPRESSION/DISCECTOMY FUSION 2 LEVELS;  Surgeon: Kathaleen Maser Pool;  Location: [facility];  Service: Neurosurgery;  Laterality: Bilateral;  Cervical five-six, six-seven anterior cervical discectomy and fusion with allograft and plating  . Hysteroscopy w/d&c 02/19/2011    Procedure: DILATATION AND CURETTAGE /HYSTEROSCOPY;  Surgeon: Dara Lords, MD;  Location: [facility];  Service: Gynecology;  Laterality: N/A;  requests one hour  . Dilation and curettage of uterus 02-19-11    & POLYP REMOVAL  . Anterior cervical decomp/discectomy fusion 06/11/2011    Procedure: ANTERIOR CERVICAL DECOMPRESSION/DISCECTOMY FUSION 1 LEVEL/HARDWARE REMOVAL;  Surgeon: Temple Pacini, MD;  Location: [facility];  Service: Neurosurgery;  Laterality: N/A;  Cervical four-five anterior cervical decompression fusion with allograft and plating; Removal of Cervical five to seven plate    Family History  Problem Relation Age of Onset  . Coronary artery disease Other   . Heart attack Mother   . Stroke Mother   . Hypertension Mother   . Heart disease Father   . Hypertension Sister   . Cancer Sister     left kidney cancer  . Cancer Brother     bladder  . Cancer Brother     liver, spread to colon  . Liver cancer Brother   . Anesthesia problems Neg Hx   . Hypotension Neg Hx   . Malignant hyperthermia Neg Hx   . Pseudochol deficiency Neg Hx  History   Social History  .  Marital Status: Married    Spouse Name: N/A    Number of Children: N/A  . Years of Education: N/A   Occupational History  . retired    Social History Main Topics  . Smoking status: Former Smoker -- 1.5 packs/day for 30 years    Types: Cigarettes  . Smokeless tobacco: Never Used   Comment: quit 19 years ago  . Alcohol Use: No  . Drug Use: No  . Sexually Active: Yes -- Female partner(s)    Birth Control/ Protection: Post-menopausal   Other Topics Concern  . Not on file   Social History Narrative   Regular exercise- no.    ROS: Please see the HPI.  All other systems reviewed and negative.  PHYSICAL EXAM:  BP 144/72  Pulse 68  Ht 5' 3.5" (1.613 m)  Wt 247 lb (112.038 kg)  BMI 43.07 kg/m2  LMP 12/30/2000  General: Well developed, well nourished, in no acute distress. Head:  Normocephalic and atraumatic. Neck: no JVD Lungs: Clear to auscultation and percussion. Heart: Normal S1 and S2.  No murmur, rubs or gallops.  Pulses: Pulses normal in all 4 extremities. Extremities: No clubbing or cyanosis. No edema. Neurologic: Alert and oriented x 3.  EKG:  NSR.  WNL.   ASSESSMENT AND PLAN:

## 2011-08-18 ENCOUNTER — Ambulatory Visit: Payer: Medicare Other | Admitting: Cardiology

## 2011-08-24 DIAGNOSIS — M4802 Spinal stenosis, cervical region: Secondary | ICD-10-CM | POA: Diagnosis not present

## 2011-09-02 ENCOUNTER — Other Ambulatory Visit: Payer: Self-pay | Admitting: *Deleted

## 2011-09-04 ENCOUNTER — Other Ambulatory Visit: Payer: Self-pay

## 2011-09-04 MED ORDER — GLUCOSE BLOOD VI STRP: ORAL_STRIP | Status: DC

## 2011-09-06 ENCOUNTER — Other Ambulatory Visit: Payer: Self-pay | Admitting: General Practice

## 2011-09-06 ENCOUNTER — Telehealth: Payer: Self-pay | Admitting: General Practice

## 2011-09-06 MED ORDER — GLUCOSE BLOOD VI STRP: ORAL_STRIP | Status: DC

## 2011-09-06 NOTE — Telephone Encounter (Signed)
Faxed order for testing strips to [facility] on [address] 09-06-11 @ 4pm.

## 2011-09-09 ENCOUNTER — Telehealth: Payer: Self-pay

## 2011-09-09 DIAGNOSIS — E7800 Pure hypercholesterolemia, unspecified: Secondary | ICD-10-CM

## 2011-09-09 MED ORDER — PRAVASTATIN SODIUM 40 MG PO TABS: 40.0000 mg | ORAL_TABLET | Freq: Every evening | ORAL | Status: DC

## 2011-09-09 NOTE — Telephone Encounter (Signed)
Pt aware of cholesterol lab results.  The pt will start Pravastatin 40mg  and have a lipid and liver profile rechecked on 10/27/11.

## 2011-09-10 ENCOUNTER — Ambulatory Visit (INDEPENDENT_AMBULATORY_CARE_PROVIDER_SITE_OTHER): Payer: Medicare Other | Admitting: *Deleted

## 2011-09-10 DIAGNOSIS — Z7901 Long term (current) use of anticoagulants: Secondary | ICD-10-CM | POA: Diagnosis not present

## 2011-09-10 DIAGNOSIS — I4891 Unspecified atrial fibrillation: Secondary | ICD-10-CM | POA: Diagnosis not present

## 2011-09-14 ENCOUNTER — Other Ambulatory Visit: Payer: Self-pay

## 2011-09-14 MED ORDER — WARFARIN SODIUM 5 MG PO TABS: ORAL_TABLET | ORAL | Status: DC

## 2011-09-15 ENCOUNTER — Other Ambulatory Visit: Payer: Self-pay | Admitting: *Deleted

## 2011-09-15 MED ORDER — WARFARIN SODIUM 5 MG PO TABS: ORAL_TABLET | ORAL | Status: DC

## 2011-09-17 ENCOUNTER — Encounter: Payer: Self-pay | Admitting: Internal Medicine

## 2011-09-17 ENCOUNTER — Ambulatory Visit (INDEPENDENT_AMBULATORY_CARE_PROVIDER_SITE_OTHER): Payer: Medicare Other | Admitting: Internal Medicine

## 2011-09-17 ENCOUNTER — Ambulatory Visit (INDEPENDENT_AMBULATORY_CARE_PROVIDER_SITE_OTHER)
Admission: RE | Admit: 2011-09-17 | Discharge: 2011-09-17 | Disposition: A | Discharge status: Home / Self Care | Payer: Medicare Other | Source: Ambulatory Visit | Attending: Internal Medicine | Admitting: Internal Medicine

## 2011-09-17 VITALS — BP 138/70 | HR 73 | Temp 97.7°F | Resp 16 | Wt 253.0 lb

## 2011-09-17 DIAGNOSIS — R059 Cough, unspecified: Secondary | ICD-10-CM

## 2011-09-17 DIAGNOSIS — R05 Cough: Secondary | ICD-10-CM

## 2011-09-17 DIAGNOSIS — J309 Allergic rhinitis, unspecified: Secondary | ICD-10-CM | POA: Diagnosis not present

## 2011-09-17 DIAGNOSIS — J019 Acute sinusitis, unspecified: Secondary | ICD-10-CM | POA: Diagnosis not present

## 2011-09-17 DIAGNOSIS — J45909 Unspecified asthma, uncomplicated: Secondary | ICD-10-CM

## 2011-09-17 MED ORDER — METHYLPREDNISOLONE ACETATE 80 MG/ML IJ SUSP
120.0000 mg | Freq: Once | INTRAMUSCULAR | Status: AC
  Administered 2011-09-17: 120 mg via INTRAMUSCULAR

## 2011-09-17 MED ORDER — AZITHROMYCIN 500 MG PO TABS: 500.0000 mg | ORAL_TABLET | Freq: Every day | ORAL | Status: AC

## 2011-09-17 NOTE — Assessment & Plan Note (Signed)
Depo-medrol IM for symptom relief

## 2011-09-17 NOTE — Assessment & Plan Note (Signed)
Depo-medrol IM for symptom relief 

## 2011-09-17 NOTE — Assessment & Plan Note (Signed)
Start zpak for the infection 

## 2011-09-17 NOTE — Patient Instructions (Signed)

## 2011-09-17 NOTE — Assessment & Plan Note (Signed)
I will check her CXR to look for pna, mass, edema 

## 2011-09-17 NOTE — Progress Notes (Signed)
Subjective:    Patient ID: [name], female    DOB: Oct 13, 1940, 71 y.o.   MRN: [id number]  Sinusitis This is a new problem. The current episode started 1 to 4 weeks ago. The problem has been gradually worsening since onset. There has been no fever. Her pain is at a severity of 0/10. She is experiencing no pain. Associated symptoms include chills, congestion, coughing (NP), sinus pressure, sneezing, a sore throat and swollen glands. Pertinent negatives include no diaphoresis, ear pain, headaches, hoarse voice, neck pain or shortness of breath.      Review of Systems  Constitutional: Positive for chills. Negative for fever, diaphoresis, activity change, appetite change, fatigue and unexpected weight change.  HENT: Positive for congestion, sore throat, rhinorrhea, sneezing and sinus pressure. Negative for hearing loss, ear pain, nosebleeds, hoarse voice, facial swelling, drooling, mouth sores, trouble swallowing, neck pain, neck stiffness, dental problem, voice change, tinnitus and ear discharge.   Eyes: Negative.   Respiratory: Positive for cough (NP) and wheezing. Negative for apnea, choking, chest tightness, shortness of breath and stridor.   Cardiovascular: Negative for chest pain, palpitations and leg swelling.  Gastrointestinal: Negative.   Genitourinary: Negative.   Musculoskeletal: Negative.   Skin: Negative.   Neurological: Negative.  Negative for headaches.  Hematological: Negative for adenopathy. Does not bruise/bleed easily.  Psychiatric/Behavioral: Negative.        Objective:   Physical Exam  Vitals reviewed. Constitutional: She is oriented to person, place, and time. She appears well-developed and well-nourished.  Non-toxic appearance. She does not have a sickly appearance. She does not appear ill. No distress.  HENT:  Head: Normocephalic and atraumatic. No trismus in the jaw.  Right Ear: Hearing, tympanic membrane, external ear and ear canal normal.  Left Ear:  Hearing, tympanic membrane, external ear and ear canal normal.  Nose: Mucosal edema present. No rhinorrhea, nose lacerations, sinus tenderness, nasal deformity, septal deviation or nasal septal hematoma. No epistaxis.  No foreign bodies. Right sinus exhibits maxillary sinus tenderness. Right sinus exhibits no frontal sinus tenderness. Left sinus exhibits maxillary sinus tenderness. Left sinus exhibits no frontal sinus tenderness.  Mouth/Throat: Oropharynx is clear and moist and mucous membranes are normal. Mucous membranes are not pale, not dry and not cyanotic. No oral lesions. No uvula swelling. No oropharyngeal exudate, posterior oropharyngeal edema, posterior oropharyngeal erythema or tonsillar abscesses.  Eyes: Conjunctivae are normal. Right eye exhibits no discharge. Left eye exhibits no discharge. No scleral icterus.  Neck: Normal range of motion. Neck supple. No JVD present. No tracheal deviation present. No thyromegaly present.  Cardiovascular: Normal rate, regular rhythm and intact distal pulses.  Exam reveals no gallop and no friction rub.   Murmur heard. Pulmonary/Chest: Effort normal. No accessory muscle usage or stridor. Not tachypneic. No respiratory distress. She has no decreased breath sounds. She has no wheezes. She has no rhonchi. She has no rales. She exhibits no tenderness.  Abdominal: Soft. Bowel sounds are normal. She exhibits no distension and no mass. There is no tenderness. There is no rebound and no guarding.  Musculoskeletal: Normal range of motion. She exhibits no edema and no tenderness.  Lymphadenopathy:    She has no cervical adenopathy.  Neurological: She is oriented to person, place, and time.  Skin: Skin is warm and dry. No rash noted. She is not diaphoretic. No erythema. No pallor.  Psychiatric: She has a normal mood and affect. Her behavior is normal. Judgment and thought content normal.  Assessment & Plan:

## 2011-10-08 ENCOUNTER — Ambulatory Visit (INDEPENDENT_AMBULATORY_CARE_PROVIDER_SITE_OTHER): Payer: Medicare Other | Admitting: Pharmacist

## 2011-10-08 DIAGNOSIS — I4891 Unspecified atrial fibrillation: Secondary | ICD-10-CM | POA: Diagnosis not present

## 2011-10-08 DIAGNOSIS — Z7901 Long term (current) use of anticoagulants: Secondary | ICD-10-CM | POA: Diagnosis not present

## 2011-10-08 LAB — POCT INR: INR: 3.1

## 2011-10-12 ENCOUNTER — Other Ambulatory Visit: Payer: Self-pay | Admitting: Internal Medicine

## 2011-10-27 ENCOUNTER — Other Ambulatory Visit (INDEPENDENT_AMBULATORY_CARE_PROVIDER_SITE_OTHER): Payer: Medicare Other

## 2011-10-27 DIAGNOSIS — E7800 Pure hypercholesterolemia, unspecified: Secondary | ICD-10-CM

## 2011-10-27 LAB — HEPATIC FUNCTION PANEL
ALT: 19 U/L (ref 0–35)
AST: 17 U/L (ref 0–37)
Albumin: 3.9 g/dL (ref 3.5–5.2)
Alkaline Phosphatase: 75 U/L (ref 39–117)
Total Protein: 6.9 g/dL (ref 6.0–8.3)

## 2011-10-27 LAB — LIPID PANEL
Cholesterol: 228 mg/dL — ABNORMAL HIGH (ref 0–200)
Triglycerides: 145 mg/dL (ref 0.0–149.0)

## 2011-10-27 LAB — LDL CHOLESTEROL, DIRECT: Direct LDL: 133.5 mg/dL

## 2011-11-05 ENCOUNTER — Ambulatory Visit (INDEPENDENT_AMBULATORY_CARE_PROVIDER_SITE_OTHER): Payer: Medicare Other | Admitting: *Deleted

## 2011-11-05 DIAGNOSIS — Z7901 Long term (current) use of anticoagulants: Secondary | ICD-10-CM

## 2011-11-05 DIAGNOSIS — I4891 Unspecified atrial fibrillation: Secondary | ICD-10-CM

## 2011-11-10 ENCOUNTER — Ambulatory Visit: Payer: Medicare Other | Admitting: Cardiology

## 2011-12-03 ENCOUNTER — Ambulatory Visit (INDEPENDENT_AMBULATORY_CARE_PROVIDER_SITE_OTHER): Payer: Medicare Other

## 2011-12-03 DIAGNOSIS — Z7901 Long term (current) use of anticoagulants: Secondary | ICD-10-CM

## 2011-12-03 DIAGNOSIS — I4891 Unspecified atrial fibrillation: Secondary | ICD-10-CM

## 2011-12-08 ENCOUNTER — Other Ambulatory Visit: Payer: Self-pay | Admitting: Internal Medicine

## 2011-12-28 ENCOUNTER — Ambulatory Visit: Payer: Medicare Other | Admitting: Cardiology

## 2011-12-31 ENCOUNTER — Ambulatory Visit (INDEPENDENT_AMBULATORY_CARE_PROVIDER_SITE_OTHER): Payer: Medicare Other | Admitting: *Deleted

## 2011-12-31 DIAGNOSIS — I4891 Unspecified atrial fibrillation: Secondary | ICD-10-CM | POA: Diagnosis not present

## 2011-12-31 DIAGNOSIS — Z7901 Long term (current) use of anticoagulants: Secondary | ICD-10-CM

## 2012-01-03 ENCOUNTER — Other Ambulatory Visit: Payer: Self-pay | Admitting: *Deleted

## 2012-01-03 ENCOUNTER — Other Ambulatory Visit: Payer: Self-pay | Admitting: Cardiology

## 2012-01-17 ENCOUNTER — Ambulatory Visit (INDEPENDENT_AMBULATORY_CARE_PROVIDER_SITE_OTHER): Payer: Medicare Other | Admitting: *Deleted

## 2012-01-17 DIAGNOSIS — I4891 Unspecified atrial fibrillation: Secondary | ICD-10-CM | POA: Diagnosis not present

## 2012-01-17 DIAGNOSIS — Z7901 Long term (current) use of anticoagulants: Secondary | ICD-10-CM

## 2012-02-01 ENCOUNTER — Other Ambulatory Visit: Payer: Self-pay | Admitting: Internal Medicine

## 2012-02-04 ENCOUNTER — Other Ambulatory Visit: Payer: Self-pay | Admitting: *Deleted

## 2012-02-04 MED ORDER — OXYCODONE-ACETAMINOPHEN 5-325 MG PO TABS: 1.0000 | ORAL_TABLET | ORAL | Status: DC | PRN

## 2012-02-04 NOTE — Telephone Encounter (Signed)
Left msg on triage requesting refill on her oxycodone. MD out of office. Pls advise...Raechel Chute

## 2012-02-04 NOTE — Telephone Encounter (Signed)
MEN is here today

## 2012-02-04 NOTE — Telephone Encounter (Signed)
Ok for refill of oxycodone 30 day supply, 3 Rx's

## 2012-02-07 ENCOUNTER — Ambulatory Visit (INDEPENDENT_AMBULATORY_CARE_PROVIDER_SITE_OTHER): Payer: Medicare Other | Admitting: *Deleted

## 2012-02-07 DIAGNOSIS — I4891 Unspecified atrial fibrillation: Secondary | ICD-10-CM | POA: Diagnosis not present

## 2012-02-07 DIAGNOSIS — Z7901 Long term (current) use of anticoagulants: Secondary | ICD-10-CM

## 2012-02-07 LAB — POCT INR: INR: 3

## 2012-03-01 ENCOUNTER — Other Ambulatory Visit: Payer: Self-pay | Admitting: Internal Medicine

## 2012-03-06 ENCOUNTER — Ambulatory Visit (INDEPENDENT_AMBULATORY_CARE_PROVIDER_SITE_OTHER): Payer: Medicare Other | Admitting: Pharmacist

## 2012-03-06 DIAGNOSIS — I4891 Unspecified atrial fibrillation: Secondary | ICD-10-CM

## 2012-03-06 DIAGNOSIS — Z7901 Long term (current) use of anticoagulants: Secondary | ICD-10-CM

## 2012-03-21 ENCOUNTER — Ambulatory Visit (INDEPENDENT_AMBULATORY_CARE_PROVIDER_SITE_OTHER): Payer: Medicare Other | Admitting: Internal Medicine

## 2012-03-21 ENCOUNTER — Encounter: Payer: Self-pay | Admitting: Internal Medicine

## 2012-03-21 VITALS — BP 130/82 | HR 68 | Temp 98.0°F

## 2012-03-21 DIAGNOSIS — L02429 Furuncle of limb, unspecified: Secondary | ICD-10-CM | POA: Diagnosis not present

## 2012-03-21 DIAGNOSIS — Z85038 Personal history of other malignant neoplasm of large intestine: Secondary | ICD-10-CM

## 2012-03-21 MED ORDER — SULFAMETHOXAZOLE-TRIMETHOPRIM 800-160 MG PO TABS: 1.0000 | ORAL_TABLET | Freq: Two times a day (BID) | ORAL | Status: DC

## 2012-03-21 NOTE — Progress Notes (Signed)
Subjective:    Patient ID: [name], female    DOB: [birth year], 72 y.o.   MRN: [id number]  HPI [name] found a knot at the medial aspect of the right thigh last Saturday, Jan 25th. Today she had sponstaneous drainage from the wound with a scant amount of blood. The area remains sore. She is a diabetic on coumadin and she is worried.  Past Medical History  Diagnosis Date  . Allergic rhinitis   . Asthma   . Gout     takes Colchicine prn  . Peptic ulcer disease   . History of PSVT (paroxysmal supraventricular tachycardia)   . Hx of colonoscopy   . HLD (hyperlipidemia)     pt doesn't take any medication for this  . DJD (degenerative joint disease)   . History of blood clots     right lung in early 90's  . Sleep apnea     doesn't use CPAP  . Shortness of breath     pt states that she can be sitting as well as exertion and get short of breath  . Bronchitis     in Jan 2013-uses Albuterol prn  . History of migraine headaches     last migraine 56yrs ago;Pt states she does get sinus headaches  . Fibromyalgia   . Osteoarthritis   . Chronic back pain     hx buldging disc  . Rash     on neck and started after she started taking her beta blocker 80yrs ago;medical MD has seen this  . GERD (gastroesophageal reflux disease)     uses Rolaids prn  . Gastric ulcer   . Hemorrhoids   . Family hx of colon cancer   . Diabetes mellitus     glimepiride  . Yeast infection of the vagina     being treated for this now by [name]  . Spinal headache     after 2nd child  . Bronchitis, acute 03-01-11    FINSHED Z-PAK-STILL ON PREDNISONE NOW  . Hypertension     takes Benazepril and Diltiazem daily  . Atrial fibrillation     has been off of coumadin since 06/02/11 d/t surgery  . Peripheral edema     takes Furosemide every other day  . Dizziness     occasionally  . Constipation     takes Colace prn  . Colon cancer 2007    s/p surgery and chemo  . Colon cancer   . Urinary frequency     . Urinary urgency   . Nocturia   . Anxiety     and panic attacks;pt states that she is claustrophobic   Past Surgical History  Procedure Date  . Caesarean section 1966/69/72    x 3  . Heel spur surgery 1992    x 2  . Ankle surgery     tumor-benign removed left ankle  . Lumbar disc surgery 2008  . Colectomy 2007    colon cancer  . Pyelonidal cystectomy     at age 62  . Knee arthrosocpy     bil;couple of years before knee replacement  . Ganglion cyst excision > 75yrs ago    removed from left pointer finger  . Cataract surgery 2010  . Tubal ligation 1972  . Cardiac catheterization 90's and 2005  . Port a cath placed 2008  . Port a cath removed 2008  . Knee surgery     left TKA  . Anterior cervical decomp/discectomy fusion 01/12/2011  Procedure: ANTERIOR CERVICAL DECOMPRESSION/DISCECTOMY FUSION 2 LEVELS;  Surgeon: [name];  Location: [facility];  Service: Neurosurgery;  Laterality: Bilateral;  Cervical five-six, six-seven anterior cervical discectomy and fusion with allograft and plating  . Hysteroscopy w/d&c 02/19/2011    Procedure: DILATATION AND CURETTAGE /HYSTEROSCOPY;  Surgeon: Julie Lords, MD;  Location: [facility];  Service: Gynecology;  Laterality: N/A;  requests one hour  . Dilation and curettage of uterus 02-19-11    & POLYP REMOVAL  . Anterior cervical decomp/discectomy fusion 06/11/2011    Procedure: ANTERIOR CERVICAL DECOMPRESSION/DISCECTOMY FUSION 1 LEVEL/HARDWARE REMOVAL;  Surgeon: Julie Pacini, MD;  Location: [facility];  Service: Neurosurgery;  Laterality: N/A;  Cervical four-five anterior cervical decompression fusion with allograft and plating; Removal of Cervical five to seven plate   Family History  Problem Relation Age of Onset  . Coronary artery disease Other   . Heart attack Mother   . Stroke Mother   . Hypertension Mother   . Heart disease Father   . Hypertension Sister   . Cancer Sister     left kidney cancer  . Cancer Brother      bladder  . Cancer Brother     liver, spread to colon  . Liver cancer Brother   . Anesthesia problems Neg Hx   . Hypotension Neg Hx   . Malignant hyperthermia Neg Hx   . Pseudochol deficiency Neg Hx    History   Social History  . Marital Status: Married    Spouse Name: N/A    Number of Children: N/A  . Years of Education: N/A   Occupational History  . retired    Social History Main Topics  . Smoking status: Former Smoker -- 1.5 packs/day for 30 years    Types: Cigarettes  . Smokeless tobacco: Never Used     Comment: quit 19 years ago  . Alcohol Use: No  . Drug Use: No  . Sexually Active: Yes -- Female partner(s)    Birth Control/ Protection: Post-menopausal   Other Topics Concern  . Not on file   Social History Narrative   Regular exercise- no.    Current Outpatient Prescriptions on File Prior to Visit  Medication Sig Dispense Refill  . ALPRAZolam (XANAX) 0.5 MG tablet Take 1 tablet (0.5 mg total) by mouth 3 (three) times daily as needed for sleep or anxiety. Take 1 tab every 6hrs prn  50 tablet  1  . colchicine 0.6 MG tablet Take 0.6 mg by mouth daily as needed. gout      . diltiazem (TIAZAC) 240 MG 24 hr capsule Take 1 capsule (240 mg total) by mouth daily.  30 capsule  6  . glimepiride (AMARYL) 4 MG tablet TAKE 1 TABLET BY MOUTH ONCE A DAY  30 tablet  4  . glucose blood test strip Test bid DX:250.00  100 each  12  . loperamide (IMODIUM) 2 MG capsule Take 2 mg by mouth 4 (four) times daily as needed. Loose stools      . MICARDIS 40 MG tablet TAKE 1 TABLET BY MOUTH DAILY.  30 tablet  1  . oxyCODONE-acetaminophen (PERCOCET/ROXICET) 5-325 MG per tablet Take 1 tablet by mouth every 4 (four) hours as needed. pain  30 tablet  0  . pravastatin (PRAVACHOL) 40 MG tablet Take 1 tablet (40 mg total) by mouth every evening.  30 tablet  11  . promethazine (PHENERGAN) 25 MG tablet Take 1 tablet (25 mg total) by mouth every  6 (six) hours as needed. Only takes for severe nausea  60  tablet  3  . VENTOLIN HFA 108 (90 BASE) MCG/ACT inhaler USE AS DIRECTED  18 each  6  . warfarin (COUMADIN) 5 MG tablet Take as directed by anticoagulation clinic  30 tablet  3  . [DISCONTINUED] digoxin (LANOXIN) 0.125 MG tablet Take 125 mcg by mouth daily.          Review of Systems System review is negative for any constitutional, cardiac, pulmonary, GI or neuro symptoms or complaints other than as described in the HPI.     Objective:   Physical Exam Filed Vitals:   03/21/12 1629  BP: 130/82  Pulse: 68  Temp: 98 F (36.7 C)   Gen'l - overweight white woman in no distress Cor- RRR PUlm - normal respirations Derm - small area of induration, 3 cm diameter, with pointing and recent drainage. The area is woody and tender. Not hot to touch       Assessment & Plan:  Furuncle (boil) right medial thigh - does not look too bad and there is no bleeding now.  Plan Antibiotic - Septra DS twice a day for 10 days. Covers MRSA  Wash with soap and water twice a day, dry and cover with a bandaid  Warm compresses several times a day  If it gets large and fluctuant - soft and feels like fluid - return for incision and drainage.

## 2012-03-21 NOTE — Patient Instructions (Addendum)
Furuncle (boil) right medial thigh - does not look too bad and there is no bleeding now.  Plan Antibiotic - Septra DS twice a day for 10 days. Covers MRSA  Wash with soap and water twice a day, dry and cover with a bandaid  Warm compresses several times a day  If it gets large and fluctuant - soft and feels like fluid - return for incision and drainage.  Abscess An abscess is an infected area that contains a collection of pus and debris. It can occur in almost any part of the body. An abscess is also known as a furuncle or boil. CAUSES   An abscess occurs when tissue gets infected. This can occur from blockage of oil or sweat glands, infection of hair follicles, or a minor injury to the skin. As the body tries to fight the infection, pus collects in the area and creates pressure under the skin. This pressure causes pain. People with weakened immune systems have difficulty fighting infections and get certain abscesses more often.   SYMPTOMS Usually an abscess develops on the skin and becomes a painful mass that is red, warm, and tender. If the abscess forms under the skin, you may feel a moveable soft area under the skin. Some abscesses break open (rupture) on their own, but most will continue to get worse without care. The infection can spread deeper into the body and eventually into the bloodstream, causing you to feel ill.   DIAGNOSIS   Your caregiver will take your medical history and perform a physical exam. A sample of fluid may also be taken from the abscess to determine what is causing your infection. TREATMENT   Your caregiver may prescribe antibiotic medicines to fight the infection. However, taking antibiotics alone usually does not cure an abscess. Your caregiver may need to make a small cut (incision) in the abscess to drain the pus. In some cases, gauze is packed into the abscess to reduce pain and to continue draining the area. HOME CARE INSTRUCTIONS    Only take over-the-counter or  prescription medicines for pain, discomfort, or fever as directed by your caregiver.   If you were prescribed antibiotics, take them as directed. Finish them even if you start to feel better.   If gauze is used, follow your caregiver's directions for changing the gauze.   To avoid spreading the infection:   Keep your draining abscess covered with a bandage.   Wash your hands well.   Do not share personal care items, towels, or whirlpools with others.   Avoid skin contact with others.   Keep your skin and clothes clean around the abscess.   Keep all follow-up appointments as directed by your caregiver.  SEEK MEDICAL CARE IF:    You have increased pain, swelling, redness, fluid drainage, or bleeding.   You have muscle aches, chills, or a general ill feeling.   You have a fever.  MAKE SURE YOU:    Understand these instructions.   Will watch your condition.   Will get help right away if you are not doing well or get worse.  Document Released: 11/18/2004 Document Revised: 08/10/2011 Document Reviewed: 04/23/2011 [facility] Patient Information 2013 [place], [place].

## 2012-03-24 ENCOUNTER — Encounter: Payer: Self-pay | Admitting: Physician Assistant

## 2012-03-30 ENCOUNTER — Encounter: Payer: Self-pay | Admitting: *Deleted

## 2012-03-31 ENCOUNTER — Telehealth: Payer: Self-pay

## 2012-03-31 ENCOUNTER — Ambulatory Visit (INDEPENDENT_AMBULATORY_CARE_PROVIDER_SITE_OTHER): Payer: Medicare Other | Admitting: Physician Assistant

## 2012-03-31 ENCOUNTER — Encounter: Payer: Self-pay | Admitting: Physician Assistant

## 2012-03-31 VITALS — BP 132/82 | HR 75 | Wt 257.6 lb

## 2012-03-31 DIAGNOSIS — Z860100 Personal history of colon polyps, unspecified: Secondary | ICD-10-CM

## 2012-03-31 DIAGNOSIS — Z85038 Personal history of other malignant neoplasm of large intestine: Secondary | ICD-10-CM

## 2012-03-31 MED ORDER — PEG-KCL-NACL-NASULF-NA ASC-C 100 G PO SOLR: 1.0000 | Freq: Once | ORAL | Status: DC

## 2012-03-31 NOTE — Telephone Encounter (Signed)
03/31/2012    RE: [name] DOB: 11/25/1940 MRN: [id number]   Dear Dr. Riley Kill,    We have scheduled the above patient for an endoscopic procedure. Our records show that she is on anticoagulation therapy.   Please advise as to how long the patient may come off her therapy of coumadin prior to the procedure, which is scheduled for 04/26/12.  Please fax back/ or route the completed form to [place] at [phone].   Sincerely,  Christie Nottingham, CMA  Please respond by 04/10/12 and route back to Christie Nottingham, CMA

## 2012-03-31 NOTE — Progress Notes (Signed)
I agree with the plan above.  MAC sedation given daily narcotic meds.

## 2012-03-31 NOTE — Patient Instructions (Addendum)
You have been scheduled for a colonoscopy with propofol. Please follow written instructions given to you at your visit today.  Please pick up your prep kit at the pharmacy within the next 1-3 days. If you use inhalers (even only as needed) or a CPAP machine, please bring them with you on the day of your procedure.  You will be contaced by our office prior to your procedure for directions on holding your Coumadin/Warfarin.  If you do not hear from our office 1 week prior to your scheduled procedure, please call [phone] to discuss. 

## 2012-03-31 NOTE — Progress Notes (Signed)
Subjective:    Patient ID: [name], female    DOB: [birth year], 72 y.o.   MRN: [id number]  HPI [name] is a pleasant 72 year old white female known to Dr. Christella Hartigan who has multiple medical problems including diabetes mellitus, atrial fibrillation for which she is on Coumadin, history of PSVT, chronic GERD, sleep apnea, and a history of colon cancer for which he underwent resection in 2007. This was a right-sided lesion T2 N1. She also has colon cancer history in her brother. She had undergone followup colonoscopy in April of 2010 and at that time had 3 polyps removed 2 of them were 3-4 mm in size the third was 5 mm. Path showed two adenomatous polyps and one hyperplastic polyp. She was scheduled for 3 year interval followup , She comes in today to discuss colonoscopy. She had undergone a cervical laminectomy in the spring of 2013 states that's why she was unable to come last year. She is doing better from that standpoint. She has no current GI complaints. She says she has had looser stools are since her surgery but no recent changes, no melena or hematochezia.     Review of Systems  Constitutional: Negative.   HENT: Negative.   Eyes: Negative.   Respiratory: Negative.   Cardiovascular: Negative.   Gastrointestinal: Negative.   Genitourinary: Negative.   Musculoskeletal: Positive for back pain and arthralgias.  Neurological: Negative.   Hematological: Bruises/bleeds easily.  Psychiatric/Behavioral: Negative.    Outpatient Prescriptions Prior to Visit  Medication Sig Dispense Refill  . ALPRAZolam (XANAX) 0.5 MG tablet Take 1 tablet (0.5 mg total) by mouth 3 (three) times daily as needed for sleep or anxiety. Take 1 tab every 6hrs prn  50 tablet  1  . colchicine 0.6 MG tablet Take 0.6 mg by mouth daily as needed. gout      . diltiazem (TIAZAC) 240 MG 24 hr capsule Take 1 capsule (240 mg total) by mouth daily.  30 capsule  6  . glimepiride (AMARYL) 4 MG tablet TAKE 1 TABLET BY MOUTH ONCE A  DAY  30 tablet  4  . glucose blood test strip Test bid DX:250.00  100 each  12  . loperamide (IMODIUM) 2 MG capsule Take 2 mg by mouth 4 (four) times daily as needed. Loose stools      . MICARDIS 40 MG tablet TAKE 1 TABLET BY MOUTH DAILY.  30 tablet  1  . oxyCODONE-acetaminophen (PERCOCET/ROXICET) 5-325 MG per tablet Take 1 tablet by mouth every 4 (four) hours as needed. pain  30 tablet  0  . pravastatin (PRAVACHOL) 40 MG tablet Take 1 tablet (40 mg total) by mouth every evening.  30 tablet  11  . promethazine (PHENERGAN) 25 MG tablet Take 1 tablet (25 mg total) by mouth every 6 (six) hours as needed. Only takes for severe nausea  60 tablet  3  . sulfamethoxazole-trimethoprim (BACTRIM DS,SEPTRA DS) 800-160 MG per tablet Take 1 tablet by mouth 2 (two) times daily.  20 tablet  0  . VENTOLIN HFA 108 (90 BASE) MCG/ACT inhaler USE AS DIRECTED  18 each  6  . warfarin (COUMADIN) 5 MG tablet Take as directed by anticoagulation clinic  30 tablet  3   Last reviewed on 03/31/2012  1:19 PM by Sammuel Cooper, PA  Allergies  Allergen Reactions  . Adhesive (Tape) Other (See Comments)    redness  . Benazepril   . Morphine Other (See Comments)    REACTION: Reaction not  Makes  you feel bad all over  . Nifedipine Hives and Itching  . Piroxicam Hives and Itching   Patient Active Problem List  Diagnosis  . DIABETES-TYPE 2  . OTHER AND UNSPECIFIED HYPERLIPIDEMIA  . GOUT  . HEADACHE, TENSION  . MIGRAINE, CHRONIC  . HYPERTENSION  . ALLERGIC RHINITIS  . ASTHMA  . PEPTIC ULCER DISEASE  . OVARIAN CYST  . OSTEOARTHRITIS, KNEE  . DEGENERATIVE DISC DISEASE  . SNORING  . CHEST PAIN UNSPECIFIED  . Personal history of malignant neoplasm of large intestine  . SLEEP APNEA, OBSTRUCTIVE  . Vaginitis  . Atrial fibrillation  . Cervical radiculopathy at C6  . DJD (degenerative joint disease)  . Pre-op evaluation  . Hyperlipidemia  . Warfarin anticoagulation  . Encounter for long-term (current) use of  anticoagulants  . Somatic complaints, multiple  . Chest pain  . Herniated cervical disc  . Cough  . Neck pain  . Anxiety  . Asthma  . Allergic rhinitis  . Abnormal CT scan, cervical spine  . Spondylolisthesis of cervical region  . Acute sinusitis, unspecified   History  Substance Use Topics  . Smoking status: Former Smoker -- 1.5 packs/day for 30 years    Types: Cigarettes  . Smokeless tobacco: Never Used     Comment: quit 19 years ago  . Alcohol Use: No   family history includes Cancer in her brothers and sister; Coronary artery disease in her other; Heart attack in her mother; Heart disease in her father; Hypertension in her mother and sister; Liver cancer in her brother; and Stroke in her mother.  There is no history of Anesthesia problems, and Hypotension, and Malignant hyperthermia, and Pseudochol deficiency, .     Objective:   Physical Exam well-developed older white female in no acute distress, pleasant blood pressure 132/82 pulse 75 height 5 foot 3 weight 257. HEENT; nontraumatic normocephalic EOMI PERRLA sclera anicteric, Neck; decreased range of motion no JVD, Cardiovascular; regular rate and rhythm with S1-S2 no murmur or gallop, Pulmonary; clear bilaterally, Abdomen; large soft nontender nondistended bowel sounds active there is no palpable mass or hepatosplenomegaly, Rectal; exam not done, Extremities; no clubbing cyanosis or edema skin warm and dry, Psych; mood and affect normal and appropriate.        Assessment & Plan:  #36  72 year old female with history of right-sided colon cancer 2007 (T2 N1), status post resection. #2 history of adenomatous and hyperplastic polyps last colonoscopy April 2010-due for follow up. #3 chronic anti-coagulation with Coumadin #4 atrial fibrillation #5 diabetes mellitus #6 obesity #7 sleep apnea no CPAP use #8 history of PSVT #9 status post cervical laminectomy Spring 2013  Plan; Schedule for colonoscopy with Dr. Christella Hartigan, procedure  was discussed in detail with the patient and she is agreeable to proceed She will need to come off of her Coumadin for 5 days prior to the procedure, and we will obtain consent from Dr. Riley Kill.

## 2012-04-03 ENCOUNTER — Telehealth: Payer: Self-pay | Admitting: *Deleted

## 2012-04-03 ENCOUNTER — Ambulatory Visit (INDEPENDENT_AMBULATORY_CARE_PROVIDER_SITE_OTHER): Payer: Medicare Other | Admitting: *Deleted

## 2012-04-03 DIAGNOSIS — I4891 Unspecified atrial fibrillation: Secondary | ICD-10-CM

## 2012-04-03 DIAGNOSIS — Z7901 Long term (current) use of anticoagulants: Secondary | ICD-10-CM | POA: Diagnosis not present

## 2012-04-03 NOTE — Telephone Encounter (Signed)
Patient notified to come off warfarin 5 days prior to procedure per Dr. Riley Kill. Pt agreed and verbalized understanding.

## 2012-04-03 NOTE — Telephone Encounter (Signed)
These instructions have been given to patient: Patient will not need lovenox injections per Dr Riley Kill, will take last dose of coumadin on 04/20/2012, when you start your coumadin back per Dr Christella Hartigan instructions take an extra 1/2 tablet for 2 days then resume your regular dose.

## 2012-04-03 NOTE — Telephone Encounter (Signed)
The patient may come off her warfarin for five days prior to the procedure.  Note that risk is increased during that period.  Decision per GI.

## 2012-04-03 NOTE — Telephone Encounter (Signed)
Message copied by Carmela Hurt on Mon Apr 03, 2012 11:51 AM ------      Message from: Shawnie Pons D      Created: Mon Apr 03, 2012 11:45 AM      Regarding: RE: pt is having a procedure       Patient will not need bridging with enoxapirin. TS      ----- Message -----         From: Carmela Hurt, RN         Sent: 04/03/2012  11:27 AM           To: Sharyn Blitz, RN, Herby Abraham, MD, #      Subject: pt is having a procedure                                 Patient for colonoscopy on 04/26/2012 by Dr Christella Hartigan, patient needs clearance to hold coumadin for 5 days and will she need lovenox bridging. Thanks, Selena Batten       ------

## 2012-04-12 ENCOUNTER — Other Ambulatory Visit: Payer: Self-pay | Admitting: Internal Medicine

## 2012-04-21 ENCOUNTER — Other Ambulatory Visit: Payer: Self-pay | Admitting: Internal Medicine

## 2012-04-21 MED ORDER — OXYCODONE-ACETAMINOPHEN 5-325 MG PO TABS: 1.0000 | ORAL_TABLET | ORAL | Status: DC | PRN

## 2012-04-21 NOTE — Telephone Encounter (Signed)
Patient is requesting refill on oxycodone, call when ready for pick up °

## 2012-04-26 ENCOUNTER — Ambulatory Visit (AMBULATORY_SURGERY_CENTER): Payer: Medicare Other | Admitting: Gastroenterology

## 2012-04-26 ENCOUNTER — Other Ambulatory Visit: Payer: Self-pay | Admitting: Gastroenterology

## 2012-04-26 ENCOUNTER — Encounter: Payer: Self-pay | Admitting: Gastroenterology

## 2012-04-26 VITALS — BP 147/80 | HR 64 | Temp 97.8°F | Resp 20 | Ht 64.0 in | Wt 257.0 lb

## 2012-04-26 DIAGNOSIS — Z86010 Personal history of colonic polyps: Secondary | ICD-10-CM

## 2012-04-26 DIAGNOSIS — Z85038 Personal history of other malignant neoplasm of large intestine: Secondary | ICD-10-CM

## 2012-04-26 LAB — GLUCOSE, CAPILLARY
Glucose-Capillary: 132 mg/dL — ABNORMAL HIGH (ref 70–99)
Glucose-Capillary: 157 mg/dL — ABNORMAL HIGH (ref 70–99)

## 2012-04-26 MED ORDER — SODIUM CHLORIDE 0.9 % IV SOLN: 500.0000 mL | INTRAVENOUS | Status: DC

## 2012-04-26 NOTE — Progress Notes (Signed)
Notified John Nulty,CRNA of pt's concern with having had ant neck fusion and positioning and of pt's SOB on exertion this morning in admitting and of pt's c/o nausea. CRNA J Nulty in to talk with pt @ 0845 about pt's and my concerns.

## 2012-04-26 NOTE — Patient Instructions (Addendum)
Discharge instructions given with verbal understanding. Normal exam. Resume previous medications. YOU HAD AN ENDOSCOPIC PROCEDURE TODAY AT THE [facility]: Refer to the procedure report that was given to you for any specific questions about what was found during the examination.  If the procedure report does not answer your questions, please call your gastroenterologist to clarify.  If you requested that your care partner not be given the details of your procedure findings, then the procedure report has been included in a sealed envelope for you to review at your convenience later.  YOU SHOULD EXPECT: Some feelings of bloating in the abdomen. Passage of more gas than usual.  Walking can help get rid of the air that was put into your GI tract during the procedure and reduce the bloating. If you had a lower endoscopy (such as a colonoscopy or flexible sigmoidoscopy) you may notice spotting of blood in your stool or on the toilet paper. If you underwent a bowel prep for your procedure, then you may not have a normal bowel movement for a few days.  DIET: Your first meal following the procedure should be a light meal and then it is ok to progress to your normal diet.  A half-sandwich or bowl of soup is an example of a good first meal.  Heavy or fried foods are harder to digest and may make you feel nauseous or bloated.  Likewise meals heavy in dairy and vegetables can cause extra gas to form and this can also increase the bloating.  Drink plenty of fluids but you should avoid alcoholic beverages for 24 hours.  ACTIVITY: Your care partner should take you home directly after the procedure.  You should plan to take it easy, moving slowly for the rest of the day.  You can resume normal activity the day after the procedure however you should NOT DRIVE or use heavy machinery for 24 hours (because of the sedation medicines used during the test).    SYMPTOMS TO REPORT IMMEDIATELY: A gastroenterologist  can be reached at any hour.  During normal business hours, 8:30 AM to 5:00 PM Monday through Friday, call [phone].  After hours and on weekends, please call the GI answering service at [phone] who will take a message and have the physician on call contact you.   Following lower endoscopy (colonoscopy or flexible sigmoidoscopy):  Excessive amounts of blood in the stool  Significant tenderness or worsening of abdominal pains  Swelling of the abdomen that is new, acute  Fever of 100F or higher  FOLLOW UP: If any biopsies were taken you will be contacted by phone or by letter within the next 1-3 weeks.  Call your gastroenterologist if you have not heard about the biopsies in 3 weeks.  Our staff will call the home number listed on your records the next business day following your procedure to check on you and address any questions or concerns that you may have at that time regarding the information given to you following your procedure. This is a courtesy call and so if there is no answer at the home number and we have not heard from you through the emergency physician on call, we will assume that you have returned to your regular daily activities without incident.  SIGNATURES/CONFIDENTIALITY: You and/or your care partner have signed paperwork which will be entered into your electronic medical record.  These signatures attest to the fact that that the information above on your After Visit Summary has been reviewed   and is understood.  Full responsibility of the confidentiality of this discharge information lies with you and/or your care-partner. 

## 2012-04-26 NOTE — Progress Notes (Signed)
Patient did not experience any of the following events: a burn prior to discharge; a fall within the facility; wrong site/side/patient/procedure/implant event; or a hospital transfer or hospital admission upon discharge from the facility. (G8907) Patient did not have preoperative order for IV antibiotic SSI prophylaxis. (G8918)  

## 2012-04-26 NOTE — Progress Notes (Signed)
A/o x 3 pleased with MAC report to Celia RN 

## 2012-04-26 NOTE — Op Note (Addendum)
[facility] [address] [place], [address]   COLONOSCOPY PROCEDURE REPORT  PATIENT: Julie, Cooper  MR#: [id number] BIRTHDATE: [birth year] , 72  yrs. old GENDER: Female ENDOSCOPIST: Rachael Fee, MD PROCEDURE DATE:  04/26/2012 PROCEDURE:   Colonoscopy, surveillance ASA CLASS:   Class III INDICATIONS:T2N1 right sided colon cancer diagnosed 2007; repeat colonoscopy 2010 found single adenoma, was recommended to have repeat at 3 year interval. MEDICATIONS: propofol (Diprivan) 250mg  IV, MAC sedation, administered by CRNA  DESCRIPTION OF PROCEDURE:   After the risks benefits and alternatives of the procedure were thoroughly explained, informed consent was obtained.  A digital rectal exam revealed no abnormalities of the rectum.   The LB [id number]  endoscope was introduced through the anus and advanced to the surgical anastomosis. No adverse events experienced.   The quality of the prep was good, using MoviPrep  The instrument was then slowly withdrawn as the colon was fully examined.   COLON FINDINGS: The right sided ileocolonic anastomosis was normal appearing.  The colon mucosa was normal throughout.  Retroflexed views revealed no abnormalities. The time to right sided anastomosis=1 minutes 45 seconds.  Withdrawal time=6 minutes 48 seconds.  The scope was withdrawn and the procedure completed. COMPLICATIONS: There were no complications.  ENDOSCOPIC IMPRESSION: The right sided ileocolonic anastomosis was normal appearing. The colon mucosa was normal throughout.  No polyps or cancers  RECOMMENDATIONS: Given your personal historyof colon cancer and also colon polyps, you will need a repeat colonoscopy in 5 years. You should restart your coumadin today.  eSigned:  Rachael Fee, MD 04/26/2012 9:02 AM Revised: 04/26/2012 9:02 AM  cc: Wyonia Hough, MD

## 2012-04-27 ENCOUNTER — Telehealth: Payer: Self-pay | Admitting: *Deleted

## 2012-04-27 NOTE — Telephone Encounter (Signed)
No answer, left message to call if questions or concerns. 

## 2012-05-01 ENCOUNTER — Other Ambulatory Visit: Payer: Self-pay | Admitting: *Deleted

## 2012-05-01 MED ORDER — WARFARIN SODIUM 5 MG PO TABS: ORAL_TABLET | ORAL | Status: DC

## 2012-05-03 ENCOUNTER — Ambulatory Visit (INDEPENDENT_AMBULATORY_CARE_PROVIDER_SITE_OTHER): Payer: Medicare Other | Admitting: *Deleted

## 2012-05-03 DIAGNOSIS — Z7901 Long term (current) use of anticoagulants: Secondary | ICD-10-CM

## 2012-05-03 LAB — POCT INR: INR: 1.8

## 2012-05-05 ENCOUNTER — Telehealth: Payer: Self-pay | Admitting: Internal Medicine

## 2012-05-05 NOTE — Telephone Encounter (Signed)
Requesting Oxycodone 5-325 refill.

## 2012-05-08 MED ORDER — OXYCODONE-ACETAMINOPHEN 5-325 MG PO TABS: 1.0000 | ORAL_TABLET | ORAL | Status: DC | PRN

## 2012-05-08 NOTE — Telephone Encounter (Signed)
3 scripts printed out and waiting on signature from Dr Debby Bud

## 2012-05-08 NOTE — Telephone Encounter (Signed)
Ok for refill: 30 day supply, 3 scripts. Thanks

## 2012-05-10 ENCOUNTER — Telehealth: Payer: Self-pay | Admitting: Internal Medicine

## 2012-05-10 ENCOUNTER — Telehealth: Payer: Self-pay

## 2012-05-10 MED ORDER — OXYCODONE-ACETAMINOPHEN 5-325 MG PO TABS: 1.0000 | ORAL_TABLET | ORAL | Status: DC | PRN

## 2012-05-10 NOTE — Telephone Encounter (Signed)
The patient told the pharmacy that Dr. Debby Bud told her she can fill her pain medicine now.  The pharmacy won't fill it because the date on it is March 28.

## 2012-05-10 NOTE — Telephone Encounter (Signed)
Pt's scripts for Oxycodone have been reprinted to be filled on the 20th of each month for the next three months starting tomorrow. This is being done because the original scripts had the wrong date on them. Pt has been notified and I apologized for the mix up. Pt does plan on picking these up tomorrow.

## 2012-05-10 NOTE — Telephone Encounter (Signed)
Pt calls this afternoon regarding Oxycodone prescriptions her husband picked up for her yesterday. She is questioning why the pharmacy would not refill them yet. She also states she is out. I let her know the last written prescription was on 04/21/12 so it can not be filled again until 05/19/12. She received scripts to be filled for 06/19/12 and 07/19/12.  She expressed understanding and had no other concerns or questions.

## 2012-05-23 ENCOUNTER — Other Ambulatory Visit: Payer: Self-pay | Admitting: Gynecology

## 2012-05-30 DIAGNOSIS — M76899 Other specified enthesopathies of unspecified lower limb, excluding foot: Secondary | ICD-10-CM | POA: Diagnosis not present

## 2012-05-30 DIAGNOSIS — M25569 Pain in unspecified knee: Secondary | ICD-10-CM | POA: Diagnosis not present

## 2012-05-31 ENCOUNTER — Other Ambulatory Visit: Payer: Self-pay | Admitting: Orthopedic Surgery

## 2012-05-31 DIAGNOSIS — M48061 Spinal stenosis, lumbar region without neurogenic claudication: Secondary | ICD-10-CM

## 2012-06-01 ENCOUNTER — Ambulatory Visit (INDEPENDENT_AMBULATORY_CARE_PROVIDER_SITE_OTHER): Payer: Medicare Other

## 2012-06-01 DIAGNOSIS — I4891 Unspecified atrial fibrillation: Secondary | ICD-10-CM | POA: Diagnosis not present

## 2012-06-01 DIAGNOSIS — Z7901 Long term (current) use of anticoagulants: Secondary | ICD-10-CM

## 2012-06-05 ENCOUNTER — Inpatient Hospital Stay
Admission: RE | Admit: 2012-06-05 | Discharge: 2012-06-05 | Disposition: A | Discharge status: Home / Self Care | Payer: Self-pay | Source: Ambulatory Visit | Attending: Orthopedic Surgery | Admitting: Orthopedic Surgery

## 2012-06-05 ENCOUNTER — Ambulatory Visit
Admission: RE | Admit: 2012-06-05 | Discharge: 2012-06-05 | Disposition: A | Discharge status: Home / Self Care | Payer: Medicare Other | Source: Ambulatory Visit | Attending: Orthopedic Surgery | Admitting: Orthopedic Surgery

## 2012-06-05 ENCOUNTER — Other Ambulatory Visit: Payer: Self-pay | Admitting: Orthopedic Surgery

## 2012-06-05 VITALS — BP 195/78 | HR 70

## 2012-06-05 DIAGNOSIS — R52 Pain, unspecified: Secondary | ICD-10-CM

## 2012-06-05 DIAGNOSIS — M48061 Spinal stenosis, lumbar region without neurogenic claudication: Secondary | ICD-10-CM

## 2012-06-05 MED ORDER — DIAZEPAM 5 MG PO TABS: 5.0000 mg | ORAL_TABLET | Freq: Once | ORAL | Status: DC

## 2012-06-05 MED ORDER — IOHEXOL 180 MG/ML  SOLN: 15.0000 mL | Freq: Once | INTRAMUSCULAR | Status: AC | PRN

## 2012-06-05 NOTE — Progress Notes (Signed)
Pt has been off phenergan for the past 2 days. Off coumadin for the past 5 days and INR  1.0 from last Friday.

## 2012-06-12 ENCOUNTER — Other Ambulatory Visit: Payer: Medicare Other

## 2012-07-02 ENCOUNTER — Emergency Department (HOSPITAL_COMMUNITY): Payer: Medicare Other

## 2012-07-02 ENCOUNTER — Encounter (HOSPITAL_COMMUNITY): Payer: Self-pay | Admitting: *Deleted

## 2012-07-02 ENCOUNTER — Emergency Department (HOSPITAL_COMMUNITY)
Admission: EM | Admit: 2012-07-02 | Discharge: 2012-07-02 | Disposition: A | Discharge status: Home / Self Care | Payer: Medicare Other | Attending: Emergency Medicine | Admitting: Emergency Medicine

## 2012-07-02 DIAGNOSIS — M549 Dorsalgia, unspecified: Secondary | ICD-10-CM | POA: Diagnosis not present

## 2012-07-02 DIAGNOSIS — Z7901 Long term (current) use of anticoagulants: Secondary | ICD-10-CM | POA: Insufficient documentation

## 2012-07-02 DIAGNOSIS — IMO0001 Reserved for inherently not codable concepts without codable children: Secondary | ICD-10-CM | POA: Insufficient documentation

## 2012-07-02 DIAGNOSIS — M1990 Unspecified osteoarthritis, unspecified site: Secondary | ICD-10-CM | POA: Diagnosis not present

## 2012-07-02 DIAGNOSIS — M25519 Pain in unspecified shoulder: Secondary | ICD-10-CM | POA: Insufficient documentation

## 2012-07-02 DIAGNOSIS — Z8639 Personal history of other endocrine, nutritional and metabolic disease: Secondary | ICD-10-CM | POA: Insufficient documentation

## 2012-07-02 DIAGNOSIS — F410 Panic disorder [episodic paroxysmal anxiety] without agoraphobia: Secondary | ICD-10-CM | POA: Insufficient documentation

## 2012-07-02 DIAGNOSIS — M109 Gout, unspecified: Secondary | ICD-10-CM | POA: Diagnosis not present

## 2012-07-02 DIAGNOSIS — Z872 Personal history of diseases of the skin and subcutaneous tissue: Secondary | ICD-10-CM | POA: Diagnosis not present

## 2012-07-02 DIAGNOSIS — Z8711 Personal history of peptic ulcer disease: Secondary | ICD-10-CM | POA: Insufficient documentation

## 2012-07-02 DIAGNOSIS — I10 Essential (primary) hypertension: Secondary | ICD-10-CM | POA: Insufficient documentation

## 2012-07-02 DIAGNOSIS — E119 Type 2 diabetes mellitus without complications: Secondary | ICD-10-CM | POA: Insufficient documentation

## 2012-07-02 DIAGNOSIS — G4730 Sleep apnea, unspecified: Secondary | ICD-10-CM | POA: Diagnosis not present

## 2012-07-02 DIAGNOSIS — Z8619 Personal history of other infectious and parasitic diseases: Secondary | ICD-10-CM | POA: Diagnosis not present

## 2012-07-02 DIAGNOSIS — Z862 Personal history of diseases of the blood and blood-forming organs and certain disorders involving the immune mechanism: Secondary | ICD-10-CM | POA: Insufficient documentation

## 2012-07-02 DIAGNOSIS — Z87891 Personal history of nicotine dependence: Secondary | ICD-10-CM | POA: Diagnosis not present

## 2012-07-02 DIAGNOSIS — I4891 Unspecified atrial fibrillation: Secondary | ICD-10-CM | POA: Insufficient documentation

## 2012-07-02 DIAGNOSIS — J45901 Unspecified asthma with (acute) exacerbation: Secondary | ICD-10-CM | POA: Insufficient documentation

## 2012-07-02 DIAGNOSIS — Z85038 Personal history of other malignant neoplasm of large intestine: Secondary | ICD-10-CM | POA: Diagnosis not present

## 2012-07-02 DIAGNOSIS — Z8719 Personal history of other diseases of the digestive system: Secondary | ICD-10-CM | POA: Diagnosis not present

## 2012-07-02 DIAGNOSIS — Z8679 Personal history of other diseases of the circulatory system: Secondary | ICD-10-CM | POA: Diagnosis not present

## 2012-07-02 DIAGNOSIS — R350 Frequency of micturition: Secondary | ICD-10-CM | POA: Diagnosis not present

## 2012-07-02 DIAGNOSIS — R0789 Other chest pain: Secondary | ICD-10-CM | POA: Insufficient documentation

## 2012-07-02 DIAGNOSIS — R002 Palpitations: Secondary | ICD-10-CM | POA: Diagnosis not present

## 2012-07-02 DIAGNOSIS — Z9861 Coronary angioplasty status: Secondary | ICD-10-CM | POA: Insufficient documentation

## 2012-07-02 DIAGNOSIS — K219 Gastro-esophageal reflux disease without esophagitis: Secondary | ICD-10-CM | POA: Diagnosis not present

## 2012-07-02 DIAGNOSIS — Z79899 Other long term (current) drug therapy: Secondary | ICD-10-CM | POA: Insufficient documentation

## 2012-07-02 DIAGNOSIS — G8929 Other chronic pain: Secondary | ICD-10-CM | POA: Diagnosis not present

## 2012-07-02 LAB — CBC
MCH: 28.6 pg (ref 26.0–34.0)
MCV: 84.4 fL (ref 78.0–100.0)
Platelets: 261 10*3/uL (ref 150–400)
RBC: 5.45 MIL/uL — ABNORMAL HIGH (ref 3.87–5.11)
RDW: 14.3 % (ref 11.5–15.5)

## 2012-07-02 LAB — URINALYSIS, ROUTINE W REFLEX MICROSCOPIC
Bilirubin Urine: NEGATIVE
Ketones, ur: NEGATIVE mg/dL
Nitrite: NEGATIVE
Protein, ur: NEGATIVE mg/dL
Specific Gravity, Urine: 1.008 (ref 1.005–1.030)
Urobilinogen, UA: 0.2 mg/dL (ref 0.0–1.0)

## 2012-07-02 LAB — COMPREHENSIVE METABOLIC PANEL
Albumin: 3.9 g/dL (ref 3.5–5.2)
BUN: 12 mg/dL (ref 6–23)
Chloride: 99 mEq/L (ref 96–112)
Creatinine, Ser: 0.72 mg/dL (ref 0.50–1.10)
GFR calc non Af Amer: 85 mL/min — ABNORMAL LOW (ref >90)
Total Bilirubin: 0.4 mg/dL (ref 0.3–1.2)

## 2012-07-02 LAB — PROTIME-INR
INR: 2.05 — ABNORMAL HIGH (ref 0.00–1.49)
Prothrombin Time: 22.3 seconds — ABNORMAL HIGH (ref 11.6–15.2)

## 2012-07-02 NOTE — ED Provider Notes (Signed)
History     CSN: [id number]  Arrival date & time 07/02/12  4098   First MD Initiated Contact with Patient 07/02/12 [phone]      Chief Complaint  Patient presents with  . Irregular Heart Beat    (Consider location/radiation/quality/duration/timing/severity/associated sxs/prior treatment) HPI Comments: Patient is a 72 year old female with a history of hypertension, atrial fibrillation, and DM who presents for palpitations with onset 3 hours ago. Patient states that palpitations began after waking at approximately 6:30 AM accompanied by shortness of breath, urinary frequency, and a pain in the patient's left shoulder that was nonradiating. Patient took her diltiazem, Micardis, and warfarin after symptoms began she takes daily. Patient states that when she enters an episode of atrial fibrillation her asthma becomes aggravated by her shortness of breath; patient took 2 puffs of her albuterol inhaler for symptoms. Patient is asymptomatic at this time with no complaints, though she appears anxious and tearful. She denies recent fevers, syncope, visual disturbances, difficulty speaking or swallowing, nausea or vomiting, abdominal pain, weakness, and numbness or tingling in her extremities. She states that she thinks her long day of yard work and mowing her lawn may have triggered her episode this morning.  Cardiologist - Dr. Riley Kill; PCP - Dr. Debby Bud  The history is provided by the patient. No language interpreter was used.    Past Medical History  Diagnosis Date  . Allergic rhinitis   . Asthma   . Gout     takes Colchicine prn  . Peptic ulcer disease   . History of PSVT (paroxysmal supraventricular tachycardia)   . HLD (hyperlipidemia)     pt doesn't take any medication for this  . DJD (degenerative joint disease)   . History of blood clots     right lung in early 90's  . Sleep apnea     doesn't use CPAP  . Shortness of breath     pt states that she can be sitting as well as exertion and  get short of breath  . Bronchitis     in Jan 2013-uses Albuterol prn  . History of migraine headaches     last migraine 71yrs ago;Pt states she does get sinus headaches  . Fibromyalgia   . Osteoarthritis   . Chronic back pain     hx buldging disc  . Rash     on neck and started after she started taking her beta blocker 77yrs ago;medical MD has seen this  . GERD (gastroesophageal reflux disease)     uses Rolaids prn  . Gastric ulcer   . Hemorrhoids   . Family hx of colon cancer   . Diabetes mellitus     glimepiride  . Yeast infection of the vagina     being treated for this now by dr.fontaine  . Spinal headache     after 2nd child  . Bronchitis, acute 03-01-11    FINSHED Z-PAK-STILL ON PREDNISONE NOW  . Hypertension     takes Benazepril and Diltiazem daily  . Atrial fibrillation     has been off of coumadin since 06/02/11 d/t surgery  . Peripheral edema     takes Furosemide every other day  . Dizziness     occasionally  . Constipation     takes Colace prn  . Colon cancer 2007    s/p surgery and chemo  . Urinary frequency   . Nocturia   . Anxiety     and panic attacks;pt states that she is claustrophobic  Past Surgical History  Procedure Laterality Date  . Caesarean section  1966/69/72    x 3  . Heel spur surgery  1992    x 2  . Ankle surgery      tumor-benign removed left ankle  . Lumbar disc surgery  2008  . Colectomy  2007    colon cancer  . Pyelonidal cystectomy      at age 59  . Knee arthrosocpy      bil;couple of years before knee replacement  . Ganglion cyst excision  > 56yrs ago    removed from left pointer finger  . Cataract surgery  2010  . Tubal ligation  1972  . Cardiac catheterization  90's and 2005  . Port a cath placed  2008  . Port a cath removed  2008  . Knee surgery      left TKA  . Anterior cervical decomp/discectomy fusion  01/12/2011    Procedure: ANTERIOR CERVICAL DECOMPRESSION/DISCECTOMY FUSION 2 LEVELS;  Surgeon: Kathaleen Maser Pool;   Location: [facility];  Service: Neurosurgery;  Laterality: Bilateral;  Cervical five-six, six-seven anterior cervical discectomy and fusion with allograft and plating  . Hysteroscopy w/d&c  02/19/2011    Procedure: DILATATION AND CURETTAGE /HYSTEROSCOPY;  Surgeon: Dara Lords, MD;  Location: [facility];  Service: Gynecology;  Laterality: N/A;  requests one hour  . Dilation and curettage of uterus  02-19-11    & POLYP REMOVAL  . Anterior cervical decomp/discectomy fusion  06/11/2011    Procedure: ANTERIOR CERVICAL DECOMPRESSION/DISCECTOMY FUSION 1 LEVEL/HARDWARE REMOVAL;  Surgeon: Temple Pacini, MD;  Location: [facility];  Service: Neurosurgery;  Laterality: N/A;  Cervical four-five anterior cervical decompression fusion with allograft and plating; Removal of Cervical five to seven plate    Family History  Problem Relation Age of Onset  . Coronary artery disease Other   . Heart attack Mother   . Stroke Mother   . Hypertension Mother   . Heart disease Father   . Hypertension Sister   . Cancer Sister     left kidney cancer  . Cancer Brother     bladder  . Cancer Brother     liver, spread to colon  . Liver cancer Brother   . Anesthesia problems Neg Hx   . Hypotension Neg Hx   . Malignant hyperthermia Neg Hx   . Pseudochol deficiency Neg Hx     History  Substance Use Topics  . Smoking status: Former Smoker -- 1.50 packs/day for 30 years    Types: Cigarettes    Quit date: 01/07/1992  . Smokeless tobacco: Never Used     Comment: quit 19 years ago  . Alcohol Use: No    OB History   Grav Para Term Preterm Abortions TAB SAB Ect Mult Living   3 3        3       Review of Systems  Constitutional: Negative for fever.  Eyes: Negative for visual disturbance.  Respiratory: Positive for chest tightness and shortness of breath. Negative for wheezing.   Cardiovascular: Positive for palpitations.  Gastrointestinal: Negative for nausea, vomiting and abdominal pain.  Genitourinary:  Positive for frequency. Negative for dysuria and hematuria.  Musculoskeletal: Positive for myalgias.  Neurological: Negative for syncope, weakness and numbness.  All other systems reviewed and are negative.    Allergies  Benazepril; Adhesive; Morphine; Nifedipine; and Piroxicam  Home Medications   Current Outpatient Rx  Name  Route  Sig  Dispense  Refill  .  diltiazem (TIAZAC) 240 MG 24 hr capsule   Oral   Take 1 capsule (240 mg total) by mouth daily.   30 capsule   6   . glimepiride (AMARYL) 4 MG tablet      TAKE 1 TABLET BY MOUTH ONCE A DAY   30 tablet   4     PT NEEDS TO SCHEDULE FOLLOW UP APPT   . loperamide (IMODIUM) 2 MG capsule   Oral   Take 2 mg by mouth 4 (four) times daily as needed. Loose stools         . MICARDIS 40 MG tablet      TAKE 1 TABLET BY MOUTH DAILY.   30 tablet   5   . oxyCODONE-acetaminophen (PERCOCET/ROXICET) 5-325 MG per tablet   Oral   Take 1 tablet by mouth every 4 (four) hours as needed. pain   30 tablet   0     DO NOT FILL UNTIL ON OR AFTER 06/19/12   . VENTOLIN HFA 108 (90 BASE) MCG/ACT inhaler      USE AS DIRECTED   18 each   6   . warfarin (COUMADIN) 5 MG tablet      Take as directed by anticoagulation clinic   30 tablet   3   . warfarin (COUMADIN) 5 MG tablet   Oral   Take 2.5-5 mg by mouth daily. Takes 1 tab (5 mg) on Sunday and thursdays all other days pt takes 1/2 tab h(2.5 mg)         . ALPRAZolam (XANAX) 0.5 MG tablet   Oral   Take 1 tablet (0.5 mg total) by mouth 3 (three) times daily as needed for sleep or anxiety. Take 1 tab every 6hrs prn   50 tablet   1   . colchicine 0.6 MG tablet   Oral   Take 0.6 mg by mouth daily as needed. gout         . glucose blood test strip      Test bid DX:250.00   100 each   12     Non- insulin dependent   . promethazine (PHENERGAN) 25 MG tablet   Oral   Take 1 tablet (25 mg total) by mouth every 6 (six) hours as needed. Only takes for severe nausea   60  tablet   3     BP 128/75  Pulse 70  Temp(Src) 98 F (36.7 C) (Oral)  Resp 19  Ht 5' 3.5" (1.613 m)  Wt 255 lb (115.667 kg)  BMI 44.46 kg/m2  SpO2 98%  LMP 12/30/2000  Physical Exam  Nursing note and vitals reviewed. Constitutional: She is oriented to person, place, and time. She appears well-developed and well-nourished. No distress.  Patient in NAD; appears mildly anxious and tearful but demonstrates calm demeanor. Patient speaks in full goal oriented sentences  HENT:  Head: Normocephalic and atraumatic.  Mouth/Throat: Oropharynx is clear and moist. No oropharyngeal exudate.  Eyes: Conjunctivae and EOM are normal. Pupils are equal, round, and reactive to light. No scleral icterus.  Neck: Normal range of motion. Neck supple.  Cardiovascular: Normal rate, regular rhythm, normal heart sounds and intact distal pulses.   Distal radial, dorsalis pedis, and posterior tibial pulses 2+ bilaterally. Capillary refill normal in all extremities  Pulmonary/Chest: Effort normal and breath sounds normal. No respiratory distress. She has no wheezes. She has no rales.  Abdominal: Soft. Bowel sounds are normal. She exhibits no distension and no mass. There is no tenderness.  There is no rebound and no guarding.  Musculoskeletal: Normal range of motion. She exhibits no tenderness.  Lymphadenopathy:    She has no cervical adenopathy.  Neurological: She is oriented to person, place, and time.  Equal grip strength bilaterally with 5 out of 5 strength against resistance in her upper and lower extremities. No sensory or motor deficits appreciated.  Skin: Skin is warm and dry. No rash noted. She is not diaphoretic. No erythema. No pallor.  Psychiatric: She has a normal mood and affect. Her behavior is normal.    ED Course  Procedures (including critical care time)  Labs Reviewed  CBC - Abnormal; Notable for the following:    RBC 5.45 (*)    Hemoglobin 15.6 (*)    All other components within normal  limits  PRO B NATRIURETIC PEPTIDE - Abnormal; Notable for the following:    Pro B Natriuretic peptide (BNP) 166.3 (*)    All other components within normal limits  COMPREHENSIVE METABOLIC PANEL - Abnormal; Notable for the following:    Glucose, Bld 175 (*)    GFR calc non Af Amer 85 (*)    All other components within normal limits  PROTIME-INR - Abnormal; Notable for the following:    Prothrombin Time 22.3 (*)    INR 2.05 (*)    All other components within normal limits  URINALYSIS, ROUTINE W REFLEX MICROSCOPIC - Abnormal; Notable for the following:    APPearance CLOUDY (*)    Leukocytes, UA LARGE (*)    All other components within normal limits  URINE MICROSCOPIC-ADD ON  POCT I-STAT TROPONIN I   Dg Chest 2 View  07/02/2012  *RADIOLOGY REPORT*  Clinical Data:  Irregular heart rate and atrial fibrillation.  CHEST - 2 VIEW  Comparison: 09/17/2011  Findings:  The heart size and mediastinal contours are within normal limits.  Both lungs are clear.  No pleural effusions are identified.  Stable degenerative changes are present in the thoracic spine.  IMPRESSION: No active disease.   Original Report Authenticated By: Irish Lack, M.D.      Date: 07/02/2012  Rate: 97  Rhythm: normal sinus rhythm  QRS Axis: normal   Intervals: normal  ST/T Wave abnormalities: normal  Conduction Disutrbances:none  Narrative Interpretation: NSR without STEMI; unchanged from prior  Old EKG Reviewed: unchanged from 08/16/2011 I have personally reviewed and interpreted this EKG.   1. Palpitations     MDM  Patient is a 72 year old female with a history of atrial fibrillation who presents for palpitations with onset 6:30 AM this morning with associated SOB, urinary frequency, and L shoulder discomfort. Patient states palpitations lasted for approximately 2.5 hours before resolving. On physical exam heart RRR, lungs CTAB, abdomen NTTP. No focal neurologic deficits appreciated in patient exhibits 5 out of 5  strength against resistance in all extremities. Workup to include CBC, CMP, BNP, troponin, PT INR, UA, and CXR. Patient's EKG today exhibits normal sinus rhythm without STEMI, unchanged from June 2013. Patient without complaints at this time and denies any pain.  Patient's labs without evidence of leukocytosis or electrolyte imbalance; kidney and liver function preserved. Troponin 0.00 and EKG unchanged from prior. There is no evidence of acute cardiopulmonary disease on CXR. Patient endorses being back to baseline. She has continued to be hemodynamically stable, satting 98% on room air. Patient stable for d/c with cardiology and PCP follow up; she endorses close follow up with [facility]. Dr. Lynelle Doctor has discussed with the patient speaking with her cardiologist  about medication she can take when in an episode of A fib. Indications for ED return discussed. Patient states comfort and understanding with this d/c plan with no unaddressed concerns.    Filed Vitals:   07/02/12 1029 07/02/12 1115 07/02/12 1200 07/02/12 1237  BP: 132/68 114/70 128/75 117/68  Pulse: 76 72 70 72  Temp: 98 F (36.7 C)     TempSrc: Oral     Resp: 22 18 19 20   Height:      Weight:      SpO2: 97% 97% 98% 98%        Antony Madura, PA-C 07/02/12 1250

## 2012-07-02 NOTE — ED Notes (Signed)
Pt returns from xray, placed on cardiac monitoring.

## 2012-07-02 NOTE — ED Provider Notes (Signed)
Patient has history of atrial fibrillation. She relates she had an episode this morning about 6:00 where she felt like her heart was racing. She took her regular morning medications which included diltiazem and micardis and relates about 9:00am her racing heart resolved. She states sometimes she gets it daily or can happen every 2-3 days. She states she does not have chest pain but her chest feels tight and she feels short of breath with it. She relates now she feels like her symptoms have resolved.  Patient is alert and sitting on the side of her bed in no acute distress. Her monitor shows her heart rate is 72 and normal sinus rhythm.  Medical screening examination/treatment/procedure(s) were conducted as a shared visit with non-physician practitioner(s) and myself.  I personally evaluated the patient during the encounter  Devoria Albe, MD, Franz Dell, MD 07/02/12 1240

## 2012-07-02 NOTE — ED Provider Notes (Signed)
See prior note   Kayhan Boardley L Frisco Cordts, MD 07/02/12 1634 

## 2012-07-02 NOTE — ED Notes (Signed)
Pt states around [phone] she went into afib with elevated heart rate associated with chest pain, sob, fatigue, and frequent urination.  Pt is on coumadin. EKG at triage shows NSR currently.

## 2012-07-02 NOTE — ED Notes (Signed)
Talked with lab about unresulted trop. Informed that green top is now out of the time frame and must be redrawn. Provider informed and CN notified.

## 2012-07-02 NOTE — ED Notes (Signed)
Pt transported to xray, husband brought to treatment room.

## 2012-07-03 ENCOUNTER — Ambulatory Visit (INDEPENDENT_AMBULATORY_CARE_PROVIDER_SITE_OTHER): Payer: Medicare Other

## 2012-07-03 DIAGNOSIS — Z7901 Long term (current) use of anticoagulants: Secondary | ICD-10-CM

## 2012-07-03 DIAGNOSIS — I4891 Unspecified atrial fibrillation: Secondary | ICD-10-CM

## 2012-07-05 ENCOUNTER — Ambulatory Visit: Payer: Medicare Other | Admitting: Internal Medicine

## 2012-07-05 ENCOUNTER — Encounter: Payer: Self-pay | Admitting: Internal Medicine

## 2012-07-05 ENCOUNTER — Ambulatory Visit (INDEPENDENT_AMBULATORY_CARE_PROVIDER_SITE_OTHER): Payer: Medicare Other | Admitting: Internal Medicine

## 2012-07-05 VITALS — BP 134/80 | HR 78 | Temp 98.5°F | Wt 251.5 lb

## 2012-07-05 DIAGNOSIS — I10 Essential (primary) hypertension: Secondary | ICD-10-CM

## 2012-07-05 DIAGNOSIS — J019 Acute sinusitis, unspecified: Secondary | ICD-10-CM

## 2012-07-05 DIAGNOSIS — E119 Type 2 diabetes mellitus without complications: Secondary | ICD-10-CM | POA: Diagnosis not present

## 2012-07-05 DIAGNOSIS — J309 Allergic rhinitis, unspecified: Secondary | ICD-10-CM | POA: Diagnosis not present

## 2012-07-05 MED ORDER — CETIRIZINE HCL 10 MG PO TABS: 10.0000 mg | ORAL_TABLET | Freq: Every day | ORAL | Status: DC

## 2012-07-05 MED ORDER — AZITHROMYCIN 250 MG PO TABS: ORAL_TABLET | ORAL | Status: DC

## 2012-07-05 MED ORDER — PREDNISONE 10 MG PO TABS: ORAL_TABLET | ORAL | Status: DC

## 2012-07-05 NOTE — Assessment & Plan Note (Signed)
Mild to mod, for antibx course,  to f/u any worsening symptoms or concerns 

## 2012-07-05 NOTE — Assessment & Plan Note (Signed)
stable overall by history and exam, recent data reviewed with pt, and pt to continue medical treatment as before,  to f/u any worsening symptoms or concerns Lab Results  Component Value Date   HGBA1C 7.8* 11/18/2010   To cont monitor cbg's on predpac

## 2012-07-05 NOTE — Progress Notes (Signed)
Subjective:    Patient ID: [name], female    DOB: 1941-02-09, 72 y.o.   MRN: [id number]  HPI   Here with 2-3 days acute onset fever, facial pain, pressure, headache, general weakness and malaise, and greenish d/c, with mild ST and cough, but pt denies chest pain, wheezing, increased sob or doe, orthopnea, PND, increased LE swelling, palpitations, dizziness or syncope.  Does have several wks ongoing nasal allergy symptoms with clearish congestion, itch and sneezing, without fever, pain, ST, cough, swelling or wheezing.   Pt denies polydipsia, polyuria, or low sugar symptoms such as weakness or confusion improved with po intake.  Pt states overall good compliance with meds, trying to follow lower cholesterol, diabetic diet. Past Medical History  Diagnosis Date  . Allergic rhinitis   . Asthma   . Gout     takes Colchicine prn  . Peptic ulcer disease   . History of PSVT (paroxysmal supraventricular tachycardia)   . HLD (hyperlipidemia)     pt doesn't take any medication for this  . DJD (degenerative joint disease)   . History of blood clots     right lung in early 90's  . Sleep apnea     doesn't use CPAP  . Shortness of breath     pt states that she can be sitting as well as exertion and get short of breath  . Bronchitis     in Jan 2013-uses Albuterol prn  . History of migraine headaches     last migraine 62yrs ago;Pt states she does get sinus headaches  . Fibromyalgia   . Osteoarthritis   . Chronic back pain     hx buldging disc  . Rash     on neck and started after she started taking her beta blocker 16yrs ago;medical MD has seen this  . GERD (gastroesophageal reflux disease)     uses Rolaids prn  . Gastric ulcer   . Hemorrhoids   . Family hx of colon cancer   . Diabetes mellitus     glimepiride  . Yeast infection of the vagina     being treated for this now by dr.fontaine  . Spinal headache     after 2nd child  . Bronchitis, acute 03-01-11    FINSHED Z-PAK-STILL ON  PREDNISONE NOW  . Hypertension     takes Benazepril and Diltiazem daily  . Atrial fibrillation     has been off of coumadin since 06/02/11 d/t surgery  . Peripheral edema     takes Furosemide every other day  . Dizziness     occasionally  . Constipation     takes Colace prn  . Colon cancer 2007    s/p surgery and chemo  . Urinary frequency   . Nocturia   . Anxiety     and panic attacks;pt states that she is claustrophobic   Past Surgical History  Procedure Laterality Date  . Caesarean section  1966/69/72    x 3  . Heel spur surgery  1992    x 2  . Ankle surgery      tumor-benign removed left ankle  . Lumbar disc surgery  2008  . Colectomy  2007    colon cancer  . Pyelonidal cystectomy      at age 86  . Knee arthrosocpy      bil;couple of years before knee replacement  . Ganglion cyst excision  > 14yrs ago    removed from left pointer finger  . Cataract  surgery  2010  . Tubal ligation  1972  . Cardiac catheterization  90's and 2005  . Port a cath placed  2008  . Port a cath removed  2008  . Knee surgery      left TKA  . Anterior cervical decomp/discectomy fusion  01/12/2011    Procedure: ANTERIOR CERVICAL DECOMPRESSION/DISCECTOMY FUSION 2 LEVELS;  Surgeon: Kathaleen Maser Pool;  Location: [facility];  Service: Neurosurgery;  Laterality: Bilateral;  Cervical five-six, six-seven anterior cervical discectomy and fusion with allograft and plating  . Hysteroscopy w/d&c  02/19/2011    Procedure: DILATATION AND CURETTAGE /HYSTEROSCOPY;  Surgeon: Dara Lords, MD;  Location: [facility];  Service: Gynecology;  Laterality: N/A;  requests one hour  . Dilation and curettage of uterus  02-19-11    & POLYP REMOVAL  . Anterior cervical decomp/discectomy fusion  06/11/2011    Procedure: ANTERIOR CERVICAL DECOMPRESSION/DISCECTOMY FUSION 1 LEVEL/HARDWARE REMOVAL;  Surgeon: Temple Pacini, MD;  Location: [facility];  Service: Neurosurgery;  Laterality: N/A;  Cervical four-five anterior  cervical decompression fusion with allograft and plating; Removal of Cervical five to seven plate    reports that she quit smoking about 20 years ago. Her smoking use included Cigarettes. She has a 45 pack-year smoking history. She has never used smokeless tobacco. She reports that she does not drink alcohol or use illicit drugs. family history includes Cancer in her brothers and sister; Coronary artery disease in her other; Heart attack in her mother; Heart disease in her father; Hypertension in her mother and sister; Liver cancer in her brother; and Stroke in her mother.  There is no history of Anesthesia problems, and Hypotension, and Malignant hyperthermia, and Pseudochol deficiency, . Allergies  Allergen Reactions  . Benazepril Other (See Comments)    coughing  . Adhesive (Tape) Other (See Comments)    redness  . Morphine Other (See Comments)       Makes you feel bad all over  . Nifedipine Hives and Itching  . Piroxicam Hives and Itching   Current Outpatient Prescriptions on File Prior to Visit  Medication Sig Dispense Refill  . ALPRAZolam (XANAX) 0.5 MG tablet Take 1 tablet (0.5 mg total) by mouth 3 (three) times daily as needed for sleep or anxiety. Take 1 tab every 6hrs prn  50 tablet  1  . colchicine 0.6 MG tablet Take 0.6 mg by mouth daily as needed. gout      . diltiazem (TIAZAC) 240 MG 24 hr capsule Take 1 capsule (240 mg total) by mouth daily.  30 capsule  6  . glimepiride (AMARYL) 4 MG tablet TAKE 1 TABLET BY MOUTH ONCE A DAY  30 tablet  4  . glucose blood test strip Test bid DX:250.00  100 each  12  . loperamide (IMODIUM) 2 MG capsule Take 2 mg by mouth 4 (four) times daily as needed. Loose stools      . MICARDIS 40 MG tablet TAKE 1 TABLET BY MOUTH DAILY.  30 tablet  5  . oxyCODONE-acetaminophen (PERCOCET/ROXICET) 5-325 MG per tablet Take 1 tablet by mouth every 4 (four) hours as needed. pain  30 tablet  0  . promethazine (PHENERGAN) 25 MG tablet Take 1 tablet (25 mg total)  by mouth every 6 (six) hours as needed. Only takes for severe nausea  60 tablet  3  . VENTOLIN HFA 108 (90 BASE) MCG/ACT inhaler USE AS DIRECTED  18 each  6  . warfarin (COUMADIN) 5 MG tablet Take  2.5-5 mg by mouth daily. Takes 1 tab (5 mg) on Sunday and thursdays all other days pt takes 1/2 tab h(2.5 mg)      . [DISCONTINUED] digoxin (LANOXIN) 0.125 MG tablet Take 125 mcg by mouth daily.       No current facility-administered medications on file prior to visit.   Review of Systems  Constitutional: Negative for unexpected weight change, or unusual diaphoresis  HENT: Negative for tinnitus.   Eyes: Negative for photophobia and visual disturbance.  Respiratory: Negative for choking and stridor.   Gastrointestinal: Negative for vomiting and blood in stool.  Genitourinary: Negative for hematuria and decreased urine volume.  Musculoskeletal: Negative for acute joint swelling Skin: Negative for color change and wound.  Neurological: Negative for tremors and numbness other than noted  Psychiatric/Behavioral: Negative for decreased concentration or  hyperactivity.       Objective:   Physical Exam BP 134/80  Pulse 78  Temp(Src) 98.5 F (36.9 C) (Oral)  Wt 251 lb 8 oz (114.08 kg)  BMI 43.85 kg/m2  SpO2 97%  LMP 12/30/2000 VS noted, mild ill Constitutional: Pt appears well-developed and well-nourished.  HENT: Head: NCAT.  Right Ear: External ear normal.  Left Ear: External ear normal. Bilat tm's with mild erythema.  Max sinus areas mild tender.  Pharynx with mild erythema, no exudate Eyes: Conjunctivae and EOM are normal. Pupils are equal, round, and reactive to light.  Neck: Normal range of motion. Neck supple.  Cardiovascular: Normal rate and regular rhythm.   Pulmonary/Chest: Effort normal and breath sounds normal.  Neurological: Pt is alert. Not confused  Skin: Skin is warm. No erythema.  Psychiatric: Pt behavior is normal. Thought content normal.     Assessment & Plan:

## 2012-07-05 NOTE — Patient Instructions (Signed)
Please take all new medication as prescribed - the antibiotic, zyrtec for allergies, and short course of low dose prednisone Please continue to monitor your blood sugars as they can be slightly elevated during the prednisone Please continue all other medications as before, and refills have been done if requested. Please have the pharmacy call with any other refills you may need.

## 2012-07-05 NOTE — Assessment & Plan Note (Signed)
With seasonal flare - for zyrtec otc prn, and predpack asd,  to f/u any worsening symptoms or concerns

## 2012-07-05 NOTE — Assessment & Plan Note (Signed)
stable overall by history and exam, recent data reviewed with pt, and pt to continue medical treatment as before,  to f/u any worsening symptoms or concerns BP Readings from Last 3 Encounters:  07/05/12 134/80  07/02/12 117/68  04/26/12 147/80

## 2012-07-24 ENCOUNTER — Ambulatory Visit (INDEPENDENT_AMBULATORY_CARE_PROVIDER_SITE_OTHER): Payer: Medicare Other | Admitting: *Deleted

## 2012-07-24 DIAGNOSIS — Z7901 Long term (current) use of anticoagulants: Secondary | ICD-10-CM | POA: Diagnosis not present

## 2012-07-24 DIAGNOSIS — I4891 Unspecified atrial fibrillation: Secondary | ICD-10-CM | POA: Diagnosis not present

## 2012-07-24 LAB — POCT INR: INR: 2.8

## 2012-07-31 ENCOUNTER — Other Ambulatory Visit: Payer: Self-pay | Admitting: Internal Medicine

## 2012-08-02 ENCOUNTER — Other Ambulatory Visit: Payer: Self-pay | Admitting: Internal Medicine

## 2012-08-02 ENCOUNTER — Other Ambulatory Visit: Payer: Self-pay | Admitting: *Deleted

## 2012-08-02 MED ORDER — WARFARIN SODIUM 5 MG PO TABS: ORAL_TABLET | ORAL | Status: DC

## 2012-08-02 MED ORDER — DILTIAZEM HCL ER BEADS 240 MG PO CP24: 240.0000 mg | ORAL_CAPSULE | Freq: Every day | ORAL | Status: DC

## 2012-08-24 DIAGNOSIS — M549 Dorsalgia, unspecified: Secondary | ICD-10-CM | POA: Diagnosis not present

## 2012-08-24 DIAGNOSIS — M48061 Spinal stenosis, lumbar region without neurogenic claudication: Secondary | ICD-10-CM | POA: Diagnosis not present

## 2012-09-07 ENCOUNTER — Ambulatory Visit (INDEPENDENT_AMBULATORY_CARE_PROVIDER_SITE_OTHER): Payer: Medicare Other | Admitting: *Deleted

## 2012-09-07 DIAGNOSIS — Z7901 Long term (current) use of anticoagulants: Secondary | ICD-10-CM | POA: Diagnosis not present

## 2012-09-07 DIAGNOSIS — I4891 Unspecified atrial fibrillation: Secondary | ICD-10-CM

## 2012-09-09 ENCOUNTER — Other Ambulatory Visit: Payer: Self-pay | Admitting: Internal Medicine

## 2012-09-20 ENCOUNTER — Encounter: Payer: Self-pay | Admitting: Internal Medicine

## 2012-09-20 ENCOUNTER — Ambulatory Visit (INDEPENDENT_AMBULATORY_CARE_PROVIDER_SITE_OTHER): Payer: Medicare Other

## 2012-09-20 ENCOUNTER — Ambulatory Visit (INDEPENDENT_AMBULATORY_CARE_PROVIDER_SITE_OTHER): Payer: Medicare Other | Admitting: Internal Medicine

## 2012-09-20 VITALS — BP 140/72 | HR 85 | Temp 97.8°F | Wt 252.0 lb

## 2012-09-20 DIAGNOSIS — E785 Hyperlipidemia, unspecified: Secondary | ICD-10-CM

## 2012-09-20 DIAGNOSIS — M109 Gout, unspecified: Secondary | ICD-10-CM

## 2012-09-20 DIAGNOSIS — E119 Type 2 diabetes mellitus without complications: Secondary | ICD-10-CM

## 2012-09-20 DIAGNOSIS — J069 Acute upper respiratory infection, unspecified: Secondary | ICD-10-CM

## 2012-09-20 DIAGNOSIS — I10 Essential (primary) hypertension: Secondary | ICD-10-CM

## 2012-09-20 MED ORDER — PROMETHAZINE-CODEINE 6.25-10 MG/5ML PO SYRP: 5.0000 mL | ORAL_SOLUTION | ORAL | Status: DC | PRN

## 2012-09-20 MED ORDER — FUROSEMIDE 40 MG PO TABS: ORAL_TABLET | ORAL | Status: DC

## 2012-09-20 MED ORDER — OXYCODONE-ACETAMINOPHEN 5-325 MG PO TABS: 1.0000 | ORAL_TABLET | ORAL | Status: DC | PRN

## 2012-09-20 NOTE — Progress Notes (Signed)
Subjective:    Patient ID: [name], female    DOB: [birth year], 72 y.o.   MRN: [id number]  HPI Ms. Panjwani presents for a week long illness with sore throat, cough, aches, chills, no fever documented.Her great-grandson was diagnosed with strep throat. She is having to use her inhaler more often due to tightness in her chest and wheezing. She has had some nausea which she attributes to mucus drainage.   Past Medical History  Diagnosis Date  . Allergic rhinitis   . Asthma   . Gout     takes Colchicine prn  . Peptic ulcer disease   . History of PSVT (paroxysmal supraventricular tachycardia)   . HLD (hyperlipidemia)     pt doesn't take any medication for this  . DJD (degenerative joint disease)   . History of blood clots     right lung in early 90's  . Sleep apnea     doesn't use CPAP  . Shortness of breath     pt states that she can be sitting as well as exertion and get short of breath  . Bronchitis     in Jan 2013-uses Albuterol prn  . History of migraine headaches     last migraine 70yrs ago;Pt states she does get sinus headaches  . Fibromyalgia   . Osteoarthritis   . Chronic back pain     hx buldging disc  . Rash     on neck and started after she started taking her beta blocker 89yrs ago;medical MD has seen this  . GERD (gastroesophageal reflux disease)     uses Rolaids prn  . Gastric ulcer   . Hemorrhoids   . Family hx of colon cancer   . Diabetes mellitus     glimepiride  . Yeast infection of the vagina     being treated for this now by dr.fontaine  . Spinal headache     after 2nd child  . Bronchitis, acute 03-01-11    FINSHED Z-PAK-STILL ON PREDNISONE NOW  . Hypertension     takes Benazepril and Diltiazem daily  . Atrial fibrillation     has been off of coumadin since 06/02/11 d/t surgery  . Peripheral edema     takes Furosemide every other day  . Dizziness     occasionally  . Constipation     takes Colace prn  . Colon cancer 2007    s/p surgery and chemo   . Urinary frequency   . Nocturia   . Anxiety     and panic attacks;pt states that she is claustrophobic   Past Surgical History  Procedure Laterality Date  . Caesarean section  1966/69/72    x 3  . Heel spur surgery  1992    x 2  . Ankle surgery      tumor-benign removed left ankle  . Lumbar disc surgery  2008  . Colectomy  2007    colon cancer  . Pyelonidal cystectomy      at age 33  . Knee arthrosocpy      bil;couple of years before knee replacement  . Ganglion cyst excision  > 103yrs ago    removed from left pointer finger  . Cataract surgery  2010  . Tubal ligation  1972  . Cardiac catheterization  90's and 2005  . Port a cath placed  2008  . Port a cath removed  2008  . Knee surgery      left TKA  . Anterior cervical  decomp/discectomy fusion  01/12/2011    Procedure: ANTERIOR CERVICAL DECOMPRESSION/DISCECTOMY FUSION 2 LEVELS;  Surgeon: Kathaleen Maser Pool;  Location: [facility];  Service: Neurosurgery;  Laterality: Bilateral;  Cervical five-six, six-seven anterior cervical discectomy and fusion with allograft and plating  . Hysteroscopy w/d&c  02/19/2011    Procedure: DILATATION AND CURETTAGE /HYSTEROSCOPY;  Surgeon: Dara Lords, MD;  Location: [facility];  Service: Gynecology;  Laterality: N/A;  requests one hour  . Dilation and curettage of uterus  02-19-11    & POLYP REMOVAL  . Anterior cervical decomp/discectomy fusion  06/11/2011    Procedure: ANTERIOR CERVICAL DECOMPRESSION/DISCECTOMY FUSION 1 LEVEL/HARDWARE REMOVAL;  Surgeon: Temple Pacini, MD;  Location: [facility];  Service: Neurosurgery;  Laterality: N/A;  Cervical four-five anterior cervical decompression fusion with allograft and plating; Removal of Cervical five to seven plate   Family History  Problem Relation Age of Onset  . Coronary artery disease Other   . Heart attack Mother   . Stroke Mother   . Hypertension Mother   . Heart disease Father   . Hypertension Sister   . Cancer Sister     left kidney  cancer  . Cancer Brother     bladder  . Cancer Brother     liver, spread to colon  . Liver cancer Brother   . Anesthesia problems Neg Hx   . Hypotension Neg Hx   . Malignant hyperthermia Neg Hx   . Pseudochol deficiency Neg Hx    History   Social History  . Marital Status: Married    Spouse Name: N/A    Number of Children: 3  . Years of Education: N/A   Occupational History  . retired    Social History Main Topics  . Smoking status: Former Smoker -- 1.50 packs/day for 30 years    Types: Cigarettes    Quit date: 01/07/1992  . Smokeless tobacco: Never Used     Comment: quit 19 years ago  . Alcohol Use: No  . Drug Use: No  . Sexually Active: Yes -- Female partner(s)    Birth Control/ Protection: Post-menopausal   Other Topics Concern  . Not on file   Social History Narrative   Regular exercise- no.    Current Outpatient Prescriptions on File Prior to Visit  Medication Sig Dispense Refill  . ALPRAZolam (XANAX) 0.5 MG tablet Take 1 tablet (0.5 mg total) by mouth 3 (three) times daily as needed for sleep or anxiety. Take 1 tab every 6hrs prn  50 tablet  1  . azithromycin (ZITHROMAX Z-PAK) 250 MG tablet Use as directed  6 each  1  . cetirizine (ZYRTEC) 10 MG tablet Take 1 tablet (10 mg total) by mouth daily.  30 tablet  11  . colchicine 0.6 MG tablet Take 0.6 mg by mouth daily as needed. gout      . diltiazem (TIAZAC) 240 MG 24 hr capsule Take 1 capsule (240 mg total) by mouth daily.  30 capsule  6  . glimepiride (AMARYL) 4 MG tablet TAKE 1 TABLET BY MOUTH ONCE A DAY  30 tablet  4  . loperamide (IMODIUM) 2 MG capsule Take 2 mg by mouth 4 (four) times daily as needed. Loose stools      . MICARDIS 40 MG tablet TAKE 1 TABLET BY MOUTH DAILY.  30 tablet  5  . ONE TOUCH ULTRA TEST test strip TEST ONCE DAILY  100 each  3  . oxyCODONE-acetaminophen (PERCOCET/ROXICET) 5-325 MG per tablet  Take 1 tablet by mouth every 4 (four) hours as needed. pain  30 tablet  0  . predniSONE  (DELTASONE) 10 MG tablet 2 tabs by mouth per day for 7 days  14 tablet  0  . promethazine (PHENERGAN) 25 MG tablet Take 1 tablet (25 mg total) by mouth every 6 (six) hours as needed. Only takes for severe nausea  60 tablet  3  . VENTOLIN HFA 108 (90 BASE) MCG/ACT inhaler USE AS DIRECTED  18 each  6  . warfarin (COUMADIN) 5 MG tablet Take as directed by coumadin clinic  30 tablet  3  . [DISCONTINUED] digoxin (LANOXIN) 0.125 MG tablet Take 125 mcg by mouth daily.       No current facility-administered medications on file prior to visit.      Review of Systems System review is negative for any constitutional, cardiac, pulmonary, GI or neuro symptoms or complaints other than as described in the HPI.     Objective:   Physical Exam Filed Vitals:   09/20/12 1639  BP: 140/72  Pulse: 85  Temp: 97.8 F (36.6 C)   Wt Readings from Last 3 Encounters:  09/20/12 252 lb (114.306 kg)  07/05/12 251 lb 8 oz (114.08 kg)  07/02/12 255 lb (115.667 kg)   Gen'l- Overweight white woman in no distress HEENT- TMs normal, throat w/o erythema or exudate, no tenderness to percussion over the facial sinuses Neck - supple Nodes - negative Cor - RRR PUlm - good breath sounds, no wheezing or rales.        Assessment & Plan:  Viral upper respiratory infection - no signs of strep throat, no signs of bacterial infection. Plan  Cough syrup  Hydrate  Tylenol for aches and discomfort  Vitamin C 1500 mg daily

## 2012-09-20 NOTE — Patient Instructions (Addendum)
Viral upper respiratory infection - no signs of strep throat, no signs of bacterial infection. Plan  Cough syrup  Hydrate  Tylenol for aches and discomfort  Vitamin C 1500 mg daily  Upper Respiratory Infection, Adult An upper respiratory infection (URI) is also sometimes known as the common cold. The upper respiratory tract includes the nose, sinuses, throat, trachea, and bronchi. Bronchi are the airways leading to the lungs. Most people improve within 1 week, but symptoms can last up to 2 weeks. A residual cough may last even longer.  CAUSES Many different viruses can infect the tissues lining the upper respiratory tract. The tissues become irritated and inflamed and often become very moist. Mucus production is also common. A cold is contagious. You can easily spread the virus to others by oral contact. This includes kissing, sharing a glass, coughing, or sneezing. Touching your mouth or nose and then touching a surface, which is then touched by another person, can also spread the virus. SYMPTOMS  Symptoms typically develop 1 to 3 days after you come in contact with a cold virus. Symptoms vary from person to person. They may include:  Runny nose.  Sneezing.  Nasal congestion.  Sinus irritation.  Sore throat.  Loss of voice (laryngitis).  Cough.  Fatigue.  Muscle aches.  Loss of appetite.  Headache.  Low-grade fever. DIAGNOSIS  You might diagnose your own cold based on familiar symptoms, since most people get a cold 2 to 3 times a year. Your caregiver can confirm this based on your exam. Most importantly, your caregiver can check that your symptoms are not due to another disease such as strep throat, sinusitis, pneumonia, asthma, or epiglottitis. Blood tests, throat tests, and X-rays are not necessary to diagnose a common cold, but they may sometimes be helpful in excluding other more serious diseases. Your caregiver will decide if any further tests are required. RISKS AND  COMPLICATIONS  You may be at risk for a more severe case of the common cold if you smoke cigarettes, have chronic heart disease (such as heart failure) or lung disease (such as asthma), or if you have a weakened immune system. The very young and very old are also at risk for more serious infections. Bacterial sinusitis, middle ear infections, and bacterial pneumonia can complicate the common cold. The common cold can worsen asthma and chronic obstructive pulmonary disease (COPD). Sometimes, these complications can require emergency medical care and may be life-threatening. PREVENTION  The best way to protect against getting a cold is to practice good hygiene. Avoid oral or hand contact with people with cold symptoms. Wash your hands often if contact occurs. There is no clear evidence that vitamin C, vitamin E, echinacea, or exercise reduces the chance of developing a cold. However, it is always recommended to get plenty of rest and practice good nutrition. TREATMENT  Treatment is directed at relieving symptoms. There is no cure. Antibiotics are not effective, because the infection is caused by a virus, not by bacteria. Treatment may include:  Increased fluid intake. Sports drinks offer valuable electrolytes, sugars, and fluids.  Breathing heated mist or steam (vaporizer or shower).  Eating chicken soup or other clear broths, and maintaining good nutrition.  Getting plenty of rest.  Using gargles or lozenges for comfort.  Controlling fevers with ibuprofen or acetaminophen as directed by your caregiver.  Increasing usage of your inhaler if you have asthma. Zinc gel and zinc lozenges, taken in the first 24 hours of the common cold, can shorten  the duration and lessen the severity of symptoms. Pain medicines may help with fever, muscle aches, and throat pain. A variety of non-prescription medicines are available to treat congestion and runny nose. Your caregiver can make recommendations and may  suggest nasal or lung inhalers for other symptoms.  HOME CARE INSTRUCTIONS   Only take over-the-counter or prescription medicines for pain, discomfort, or fever as directed by your caregiver.  Use a warm mist humidifier or inhale steam from a shower to increase air moisture. This may keep secretions moist and make it easier to breathe.  Drink enough water and fluids to keep your urine clear or pale yellow.  Rest as needed.  Return to work when your temperature has returned to normal or as your caregiver advises. You may need to stay home longer to avoid infecting others. You can also use a face mask and careful hand washing to prevent spread of the virus. SEEK MEDICAL CARE IF:   After the first few days, you feel you are getting worse rather than better.  You need your caregiver's advice about medicines to control symptoms.  You develop chills, worsening shortness of breath, or brown or red sputum. These may be signs of pneumonia.  You develop yellow or brown nasal discharge or pain in the face, especially when you bend forward. These may be signs of sinusitis.  You develop a fever, swollen neck glands, pain with swallowing, or white areas in the back of your throat. These may be signs of strep throat. SEEK IMMEDIATE MEDICAL CARE IF:   You have a fever.  You develop severe or persistent headache, ear pain, sinus pain, or chest pain.  You develop wheezing, a prolonged cough, cough up blood, or have a change in your usual mucus (if you have chronic lung disease).  You develop sore muscles or a stiff neck. Document Released: 08/04/2000 Document Revised: 05/03/2011 Document Reviewed: 06/12/2010 [facility] Patient Information 2014 [place], [place].

## 2012-09-21 LAB — COMPREHENSIVE METABOLIC PANEL
ALT: 31 U/L (ref 0–35)
AST: 32 U/L (ref 0–37)
Albumin: 3.7 g/dL (ref 3.5–5.2)
BUN: 18 mg/dL (ref 6–23)
Calcium: 9.7 mg/dL (ref 8.4–10.5)
Chloride: 101 mEq/L (ref 96–112)
Potassium: 5.1 mEq/L (ref 3.5–5.1)
Sodium: 140 mEq/L (ref 135–145)
Total Protein: 6.8 g/dL (ref 6.0–8.3)

## 2012-09-21 LAB — HEPATIC FUNCTION PANEL
Albumin: 3.7 g/dL (ref 3.5–5.2)
Bilirubin, Direct: 0.1 mg/dL (ref 0.0–0.3)
Total Protein: 6.8 g/dL (ref 6.0–8.3)

## 2012-09-21 LAB — URIC ACID: Uric Acid, Serum: 4.3 mg/dL (ref 2.4–7.0)

## 2012-09-21 LAB — LDL CHOLESTEROL, DIRECT: Direct LDL: 132.7 mg/dL

## 2012-09-22 ENCOUNTER — Other Ambulatory Visit: Payer: Self-pay | Admitting: Internal Medicine

## 2012-09-22 DIAGNOSIS — E785 Hyperlipidemia, unspecified: Secondary | ICD-10-CM

## 2012-09-22 MED ORDER — ATORVASTATIN CALCIUM 20 MG PO TABS: 20.0000 mg | ORAL_TABLET | Freq: Every day | ORAL | Status: DC

## 2012-09-24 MED ORDER — ATORVASTATIN CALCIUM 40 MG PO TABS: 40.0000 mg | ORAL_TABLET | Freq: Every day | ORAL | Status: DC

## 2012-09-24 NOTE — Assessment & Plan Note (Signed)
Taking and tolerating medication.   Plan Lipid panel with recommendations to follow.  Addendum:  LDL above goal of 100 or less  Plan Increase lipitor to 40 mg daily

## 2012-09-24 NOTE — Assessment & Plan Note (Signed)
For Uric acid level with recommendations to follow.   Addendum: uric acid normal range - no need for preventive medical therapy

## 2012-09-24 NOTE — Assessment & Plan Note (Signed)
Due for follow up lab with recommendations to follow.  

## 2012-09-24 NOTE — Assessment & Plan Note (Signed)
BP Readings from Last 3 Encounters:  09/20/12 140/72  07/05/12 134/80  07/02/12 117/68   Adequate control on present medications

## 2012-09-27 ENCOUNTER — Other Ambulatory Visit: Payer: Self-pay

## 2012-10-04 ENCOUNTER — Other Ambulatory Visit: Payer: Self-pay | Admitting: Cardiology

## 2012-10-04 ENCOUNTER — Other Ambulatory Visit: Payer: Self-pay | Admitting: Internal Medicine

## 2012-10-04 ENCOUNTER — Other Ambulatory Visit: Payer: Self-pay | Admitting: *Deleted

## 2012-10-04 MED ORDER — WARFARIN SODIUM 5 MG PO TABS: ORAL_TABLET | ORAL | Status: DC

## 2012-10-19 ENCOUNTER — Ambulatory Visit (INDEPENDENT_AMBULATORY_CARE_PROVIDER_SITE_OTHER): Payer: Medicare Other | Admitting: *Deleted

## 2012-10-19 DIAGNOSIS — I4891 Unspecified atrial fibrillation: Secondary | ICD-10-CM | POA: Diagnosis not present

## 2012-10-19 DIAGNOSIS — Z7901 Long term (current) use of anticoagulants: Secondary | ICD-10-CM

## 2012-10-24 ENCOUNTER — Encounter: Payer: Self-pay | Admitting: Internal Medicine

## 2012-10-24 ENCOUNTER — Ambulatory Visit (INDEPENDENT_AMBULATORY_CARE_PROVIDER_SITE_OTHER): Payer: Medicare Other | Admitting: Internal Medicine

## 2012-10-24 VITALS — BP 160/88 | HR 72 | Temp 99.4°F | Resp 16 | Wt 257.0 lb

## 2012-10-24 DIAGNOSIS — J019 Acute sinusitis, unspecified: Secondary | ICD-10-CM | POA: Diagnosis not present

## 2012-10-24 DIAGNOSIS — I10 Essential (primary) hypertension: Secondary | ICD-10-CM

## 2012-10-24 DIAGNOSIS — E119 Type 2 diabetes mellitus without complications: Secondary | ICD-10-CM

## 2012-10-24 DIAGNOSIS — J45909 Unspecified asthma, uncomplicated: Secondary | ICD-10-CM | POA: Diagnosis not present

## 2012-10-24 MED ORDER — CEFUROXIME AXETIL 500 MG PO TABS: ORAL_TABLET | ORAL | Status: DC

## 2012-10-24 NOTE — Progress Notes (Signed)
  Subjective:    Patient ID: [name], female    DOB: 10/16/1940, 72 y.o.   MRN: [id number]  Sinusitis This is a new problem. The current episode started 1 to 4 weeks ago (1 mo). The problem has been gradually worsening since onset. There has been no fever. The pain is moderate. Associated symptoms include chills, congestion, ear pain, sinus pressure and a sore throat. Pertinent negatives include no coughing or headaches. Past treatments include nothing. The treatment provided no relief.      Review of Systems  Constitutional: Positive for chills. Negative for activity change, appetite change, fatigue and unexpected weight change.  HENT: Positive for ear pain, congestion, sore throat and sinus pressure. Negative for mouth sores.   Eyes: Negative for visual disturbance.  Respiratory: Positive for wheezing. Negative for cough and chest tightness.   Gastrointestinal: Negative for nausea and abdominal pain.  Genitourinary: Negative for frequency, difficulty urinating and vaginal pain.  Musculoskeletal: Negative for back pain and gait problem.  Skin: Negative for pallor and rash.  Neurological: Negative for dizziness, tremors, weakness, numbness and headaches.  Psychiatric/Behavioral: Negative for confusion and sleep disturbance.       Objective:   Physical Exam  Constitutional: She appears well-developed. No distress.  HENT:  Head: Normocephalic.  Right Ear: External ear normal.  Left Ear: External ear normal.  Mouth/Throat: Oropharynx is clear and moist. No oropharyngeal exudate.  eryth nasal mucosa, yellow d/c  Eyes: Conjunctivae are normal. Pupils are equal, round, and reactive to light. Right eye exhibits no discharge. Left eye exhibits no discharge.  Neck: Normal range of motion. Neck supple. No JVD present. No tracheal deviation present. No thyromegaly present.  Cardiovascular: Normal rate, regular rhythm and normal heart sounds.   Pulmonary/Chest: No stridor. No respiratory  distress. She has no wheezes.  Abdominal: Soft. Bowel sounds are normal. She exhibits no distension and no mass. There is no tenderness. There is no rebound and no guarding.  Musculoskeletal: She exhibits no edema and no tenderness.  Lymphadenopathy:    She has no cervical adenopathy.  Neurological: She displays normal reflexes. No cranial nerve deficit. She exhibits normal muscle tone. Coordination normal.  Skin: No rash noted. No erythema.  Psychiatric: She has a normal mood and affect. Her behavior is normal. Judgment and thought content normal.          Assessment & Plan:

## 2012-10-24 NOTE — Assessment & Plan Note (Signed)
Ceftin x 10 d RTC if not better

## 2012-10-24 NOTE — Assessment & Plan Note (Signed)
She thinks she hasn't been sleeping well lately due to sinus infection  BP Readings from Last 3 Encounters:  10/24/12 160/88  09/20/12 140/72  07/05/12 134/80

## 2012-10-24 NOTE — Assessment & Plan Note (Signed)
Continue with current prescription therapy as reflected on the Med list.  

## 2012-10-24 NOTE — Assessment & Plan Note (Signed)
Continue with current prn Albuterol prescription therapy as reflected on the Med list.

## 2012-11-06 ENCOUNTER — Telehealth: Payer: Self-pay | Admitting: Internal Medicine

## 2012-11-06 DIAGNOSIS — J309 Allergic rhinitis, unspecified: Secondary | ICD-10-CM | POA: Diagnosis not present

## 2012-11-06 DIAGNOSIS — J329 Chronic sinusitis, unspecified: Secondary | ICD-10-CM | POA: Diagnosis not present

## 2012-11-06 NOTE — Telephone Encounter (Signed)
Recd records from [facility]. Forwarding. To Dr.Norins

## 2012-11-14 IMAGING — CT CT NECK W/ CM
4 of 5 series · 15 of 33 positions shown, 18 images · IV contrast (omnipaque)
Comparison: Radiographs 06/28/2011, MRI 05/20/2011, radiographs
05/13/2011

CLINICAL DATA: Recent cervical fusion C4-5 on 06/11/2011.  Prior
cervical fusion C5-C7.  Difficulty breathing and swallowing.
Prevertebral soft tissue swelling on recent x-ray 06/28/2011.  Rule
out abscess or hematoma.

CT NECK WITH CONTRAST
TECHNIQUE: Multidetector CT imaging of the neck was performed with
intravenous contrast.
Contrast: 75mL OMNIPAQUE IOHEXOL 300 MG/ML  SOLN

[Series 3: neck_routine 3.0 b40s st · axial · 0.42mm/px · z∈[-219,-174]mm · 2 of 74 slices shown]
[im 15/74  bone]
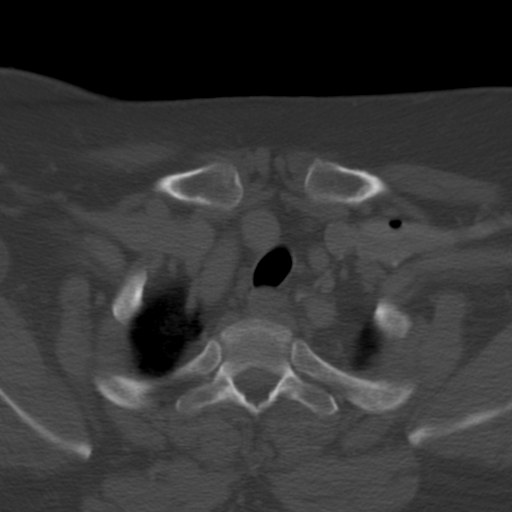
[im 30/74  bone]
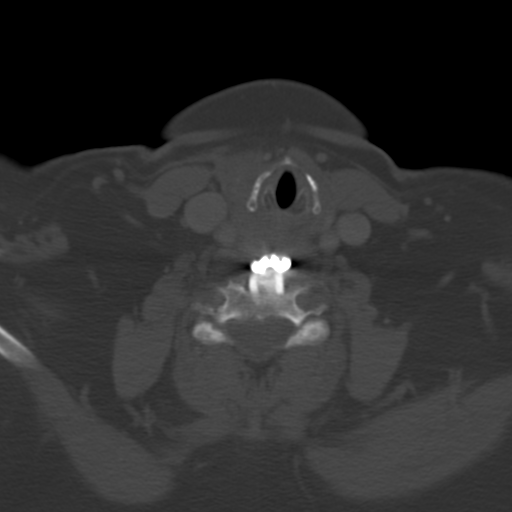

[Series 602: cor · coronal · 0.43mm/px · 3 of 73 slices shown]
[im 15/73  bone]
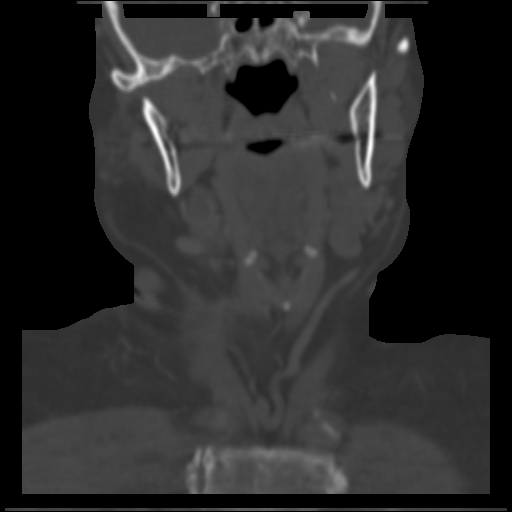
[im 29/73  bone]
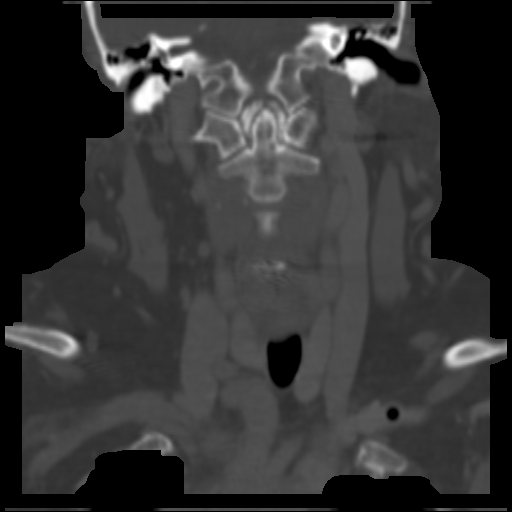
[im 44/73  bone]
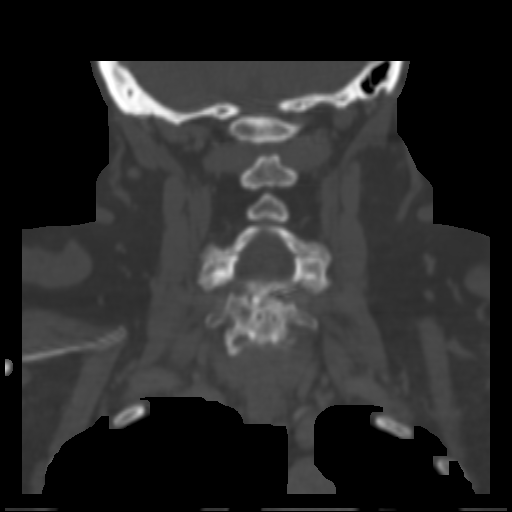

[Series 603: sag · sagittal · 0.43mm/px · 5 of 91 slices shown, 6 images]
[im 31/91  bone]
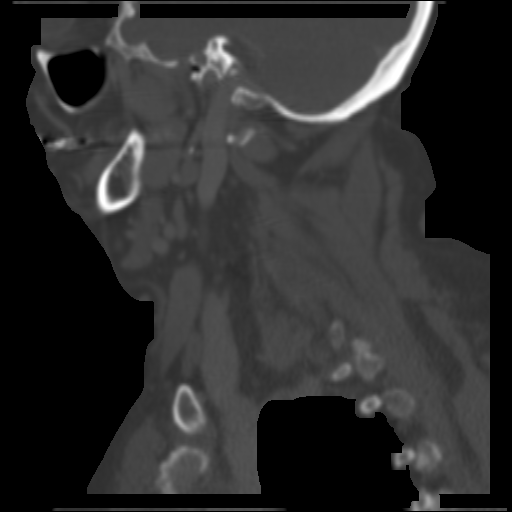
[im 38/91  bone]
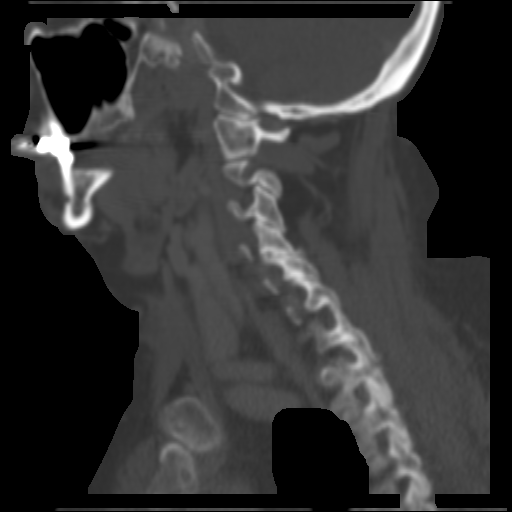
[im 46/91  soft-tissue]
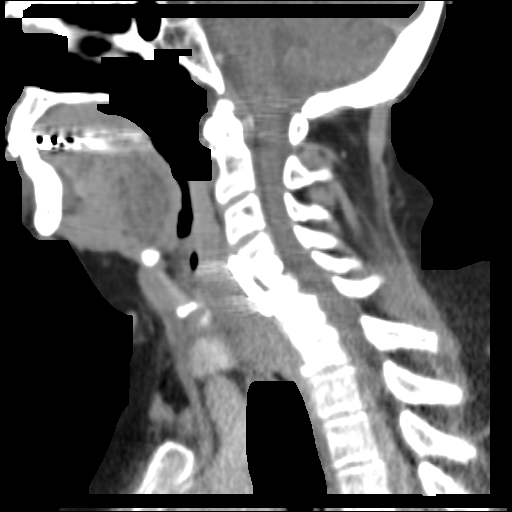
[im 46/91  bone]
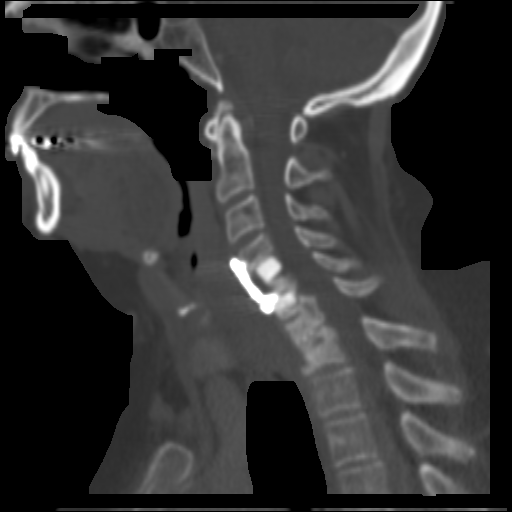
[im 53/91  bone]
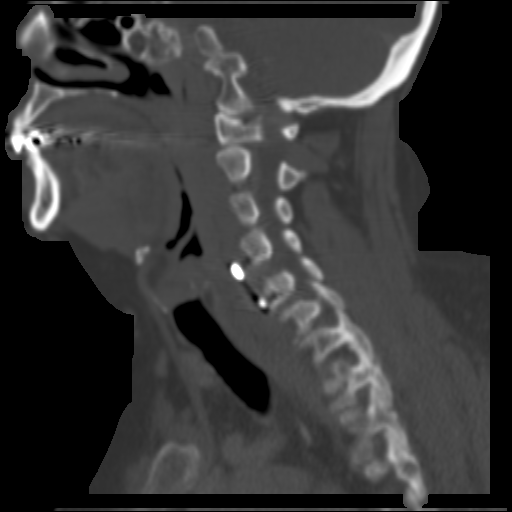
[im 61/91  bone]
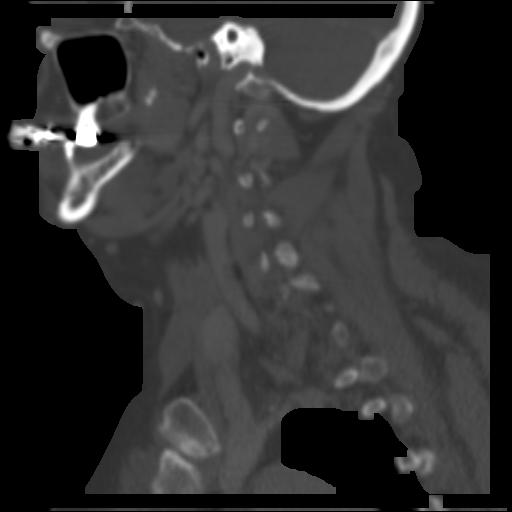

[Series 604: axial · axial · 0.43mm/px · z∈[-251,-111]mm · 5 of 108 slices shown, 7 images]
[im 18/108  soft-tissue]
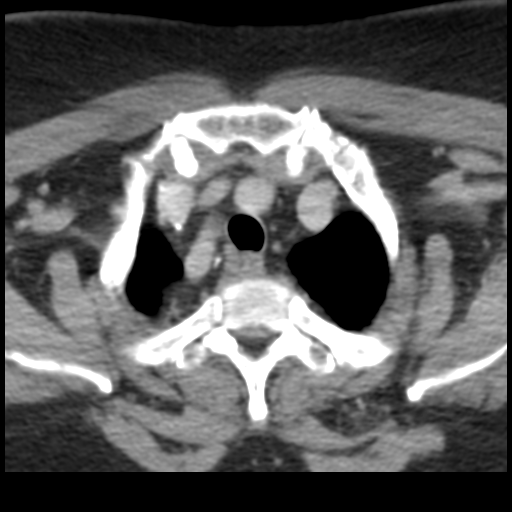
[im 18/108  bone]
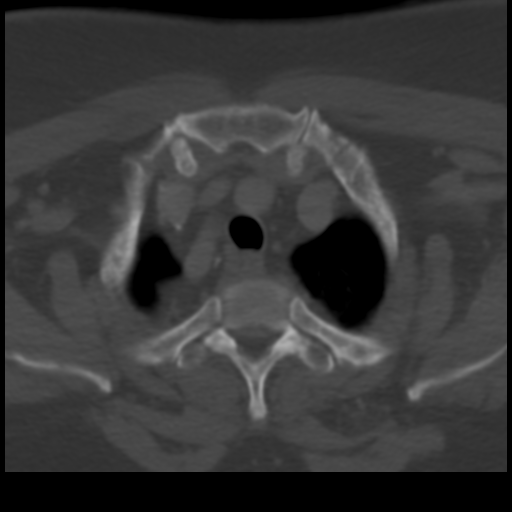
[im 36/108  bone]
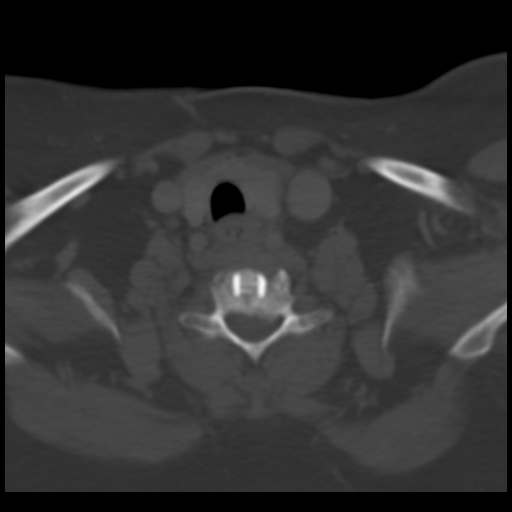
[im 54/108  bone]
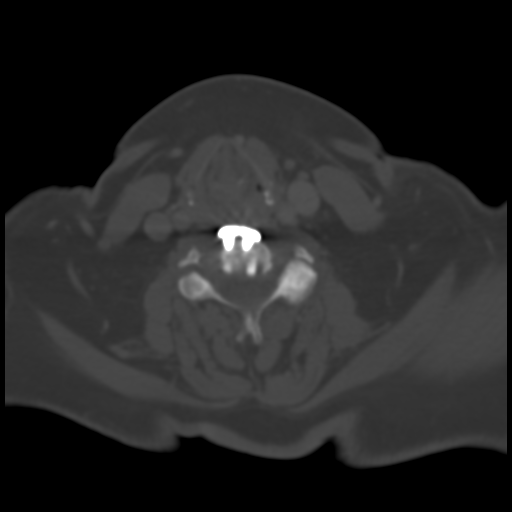
[im 72/108  bone]
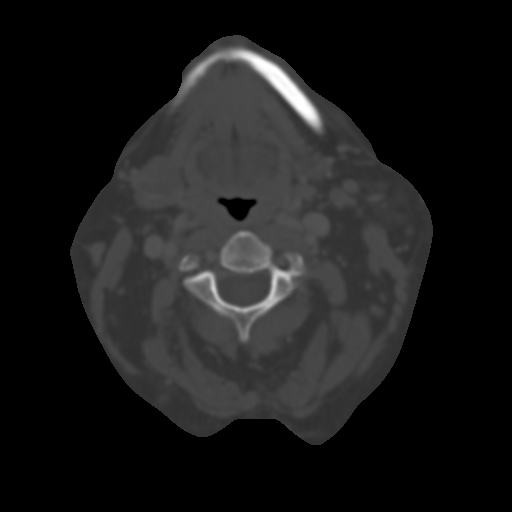
[im 90/108  soft-tissue]
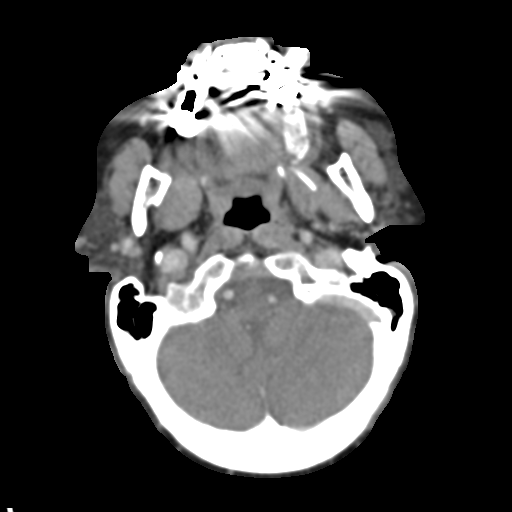
[im 90/108  bone]
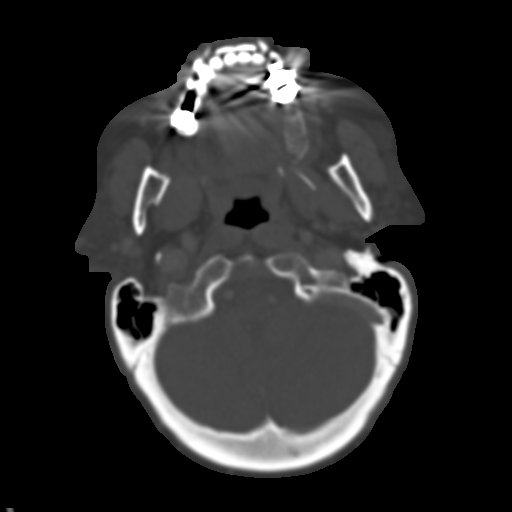

[15 of 33 positions shown; findings below may reference images not displayed]

FINDINGS: ACDF of C4-5.  Anterior plate and screws in good
position.  Interbody bone graft in good position.

Anterior hardware at C5-6 and C6-7 has been recently removed. It is
difficult determine if there  is a solid fusion at these levels.
There is  4 mm anterior slip C5-6 which has increased from the
prior radiographs of 05/13/2011 and may indicate abnormal movement
at this level.  Vertebral body alignment is normal at C6-7.
Widening of the interspinous space between C6 and C7 is unchanged
from prior studies and may be due to old ligament injury.

Prevertebral soft tissue swelling is present at C4-5 and C5-6.  No
well-defined fluid collection is seen.  No hematoma or abscess is
present.  I note that the prevertebral soft tissue swelling was
also present on preoperative study of 05/13/2011 and this may be
due to large patient size.  There may also be an element of
postoperative edema without fluid collection.  No bony erosion is
seen to suggest diskitis or osteomyelitis.  No epidural abscess is
seen.
IMPRESSION: Prevertebral soft tissue swelling is present however no abscess or
hematoma is identified.  The swelling is similar to preoperative
studies.

Satisfactory ACDF of C4-5.

4 mm anterior slip C5-6, this has progressed from preoperative
studies and may indicate instability.  Flexion/extension views may
be helpful.

Widening of the interspinous space between C6 and C7 appears
chronic and unchanged.  Question chronic ligament injury.

## 2012-12-05 DIAGNOSIS — E119 Type 2 diabetes mellitus without complications: Secondary | ICD-10-CM | POA: Diagnosis not present

## 2012-12-05 DIAGNOSIS — Z961 Presence of intraocular lens: Secondary | ICD-10-CM | POA: Diagnosis not present

## 2012-12-11 ENCOUNTER — Ambulatory Visit: Payer: Medicare Other | Admitting: Internal Medicine

## 2012-12-13 ENCOUNTER — Encounter: Payer: Self-pay | Admitting: Internal Medicine

## 2012-12-19 ENCOUNTER — Telehealth: Payer: Self-pay | Admitting: Internal Medicine

## 2012-12-19 DIAGNOSIS — J329 Chronic sinusitis, unspecified: Secondary | ICD-10-CM | POA: Diagnosis not present

## 2012-12-19 DIAGNOSIS — M26629 Arthralgia of temporomandibular joint, unspecified side: Secondary | ICD-10-CM | POA: Diagnosis not present

## 2012-12-19 DIAGNOSIS — J019 Acute sinusitis, unspecified: Secondary | ICD-10-CM | POA: Diagnosis not present

## 2012-12-19 NOTE — Telephone Encounter (Signed)
Recd records from [facility] Forwarding 3pgs to Dr.Norins  °

## 2012-12-28 ENCOUNTER — Other Ambulatory Visit: Payer: Self-pay

## 2012-12-28 ENCOUNTER — Telehealth: Payer: Self-pay | Admitting: *Deleted

## 2012-12-28 MED ORDER — OXYCODONE-ACETAMINOPHEN 5-325 MG PO TABS: 1.0000 | ORAL_TABLET | ORAL | Status: DC | PRN

## 2012-12-28 NOTE — Telephone Encounter (Signed)
Pt called requesting Oxycodone refill.  Please advise 

## 2012-12-28 NOTE — Telephone Encounter (Signed)
Spoke with pt advised Rx written 

## 2012-12-28 NOTE — Telephone Encounter (Signed)
Saw Dr. Roena Malady Sept/  OK for 3 scripts

## 2013-01-01 ENCOUNTER — Other Ambulatory Visit: Payer: Self-pay | Admitting: Internal Medicine

## 2013-01-08 ENCOUNTER — Encounter: Payer: Self-pay | Admitting: Internal Medicine

## 2013-01-08 ENCOUNTER — Ambulatory Visit (INDEPENDENT_AMBULATORY_CARE_PROVIDER_SITE_OTHER)
Admission: RE | Admit: 2013-01-08 | Discharge: 2013-01-08 | Disposition: A | Discharge status: Home / Self Care | Payer: Medicare Other | Source: Ambulatory Visit | Attending: Internal Medicine | Admitting: Internal Medicine

## 2013-01-08 ENCOUNTER — Ambulatory Visit (INDEPENDENT_AMBULATORY_CARE_PROVIDER_SITE_OTHER): Payer: Medicare Other | Admitting: Internal Medicine

## 2013-01-08 VITALS — BP 132/78 | HR 90 | Temp 98.7°F | Resp 16 | Ht 63.5 in | Wt 259.0 lb

## 2013-01-08 DIAGNOSIS — R05 Cough: Secondary | ICD-10-CM | POA: Diagnosis not present

## 2013-01-08 DIAGNOSIS — J209 Acute bronchitis, unspecified: Secondary | ICD-10-CM | POA: Diagnosis not present

## 2013-01-08 DIAGNOSIS — R059 Cough, unspecified: Secondary | ICD-10-CM

## 2013-01-08 MED ORDER — PROMETHAZINE-DM 6.25-15 MG/5ML PO SYRP: 5.0000 mL | ORAL_SOLUTION | Freq: Four times a day (QID) | ORAL | Status: DC | PRN

## 2013-01-08 MED ORDER — CEFUROXIME AXETIL 500 MG PO TABS: 500.0000 mg | ORAL_TABLET | Freq: Two times a day (BID) | ORAL | Status: DC

## 2013-01-08 NOTE — Patient Instructions (Signed)
Acute Bronchitis Bronchitis is inflammation of the airways that extend from the windpipe into the lungs (bronchi). The inflammation often causes mucus to develop. This leads to a cough, which is the most common symptom of bronchitis.  In acute bronchitis, the condition usually develops suddenly and goes away over time, usually in a couple weeks. Smoking, allergies, and asthma can make bronchitis worse. Repeated episodes of bronchitis may cause further lung problems.  CAUSES Acute bronchitis is most often caused by the same virus that causes a cold. The virus can spread from person to person (contagious).  SIGNS AND SYMPTOMS   Cough.   Fever.   Coughing up mucus.   Body aches.   Chest congestion.   Chills.   Shortness of breath.   Sore throat.  DIAGNOSIS  Acute bronchitis is usually diagnosed through a physical exam. Tests, such as chest X-rays, are sometimes done to rule out other conditions.  TREATMENT  Acute bronchitis usually goes away in a couple weeks. Often times, no medical treatment is necessary. Medicines are sometimes given for relief of fever or cough. Antibiotics are usually not needed but may be prescribed in certain situations. In some cases, an inhaler may be recommended to help reduce shortness of breath and control the cough. A cool mist vaporizer may also be used to help thin bronchial secretions and make it easier to clear the chest.  HOME CARE INSTRUCTIONS  Get plenty of rest.   Drink enough fluids to keep your urine clear or pale yellow (unless you have a medical condition that requires fluid restriction). Increasing fluids may help thin your secretions and will prevent dehydration.   Only take over-the-counter or prescription medicines as directed by your health care provider.   Avoid smoking and secondhand smoke. Exposure to cigarette smoke or irritating chemicals will make bronchitis worse. If you are a smoker, consider using nicotine gum or skin  patches to help control withdrawal symptoms. Quitting smoking will help your lungs heal faster.   Reduce the chances of another bout of acute bronchitis by washing your hands frequently, avoiding people with cold symptoms, and trying not to touch your hands to your mouth, nose, or eyes.   Follow up with your health care provider as directed.  SEEK MEDICAL CARE IF: Your symptoms do not improve after 1 week of treatment.  SEEK IMMEDIATE MEDICAL CARE IF:  You develop an increased fever or chills.   You have chest pain.   You have severe shortness of breath.  You have bloody sputum.   You develop dehydration.  You develop fainting.  You develop repeated vomiting.  You develop a severe headache. MAKE SURE YOU:   Understand these instructions.  Will watch your condition.  Will get help right away if you are not doing well or get worse. Document Released: 03/18/2004 Document Revised: 10/11/2012 Document Reviewed: 08/01/2012 [employer] Patient Information 2014 [employer].  

## 2013-01-08 NOTE — Progress Notes (Signed)
Subjective:    Patient ID: [name], female    DOB: 1940/08/08, 72 y.o.   MRN: [id number]  Cough This is a new problem. The current episode started in the past 7 days. The problem has been unchanged. The problem occurs every few hours. The cough is productive of purulent sputum. Associated symptoms include a sore throat. Pertinent negatives include no chest pain, chills, ear congestion, ear pain, fever, headaches, heartburn, hemoptysis, myalgias, nasal congestion, postnasal drip, rash, rhinorrhea, shortness of breath, sweats, weight loss or wheezing. She has tried prescription cough suppressant and a beta-agonist inhaler for the symptoms. The treatment provided moderate relief. Her past medical history is significant for asthma.      Review of Systems  Constitutional: Negative.  Negative for fever, chills, weight loss, diaphoresis, activity change, appetite change, fatigue and unexpected weight change.  HENT: Positive for sore throat. Negative for congestion, ear pain, postnasal drip, rhinorrhea, sinus pressure, sneezing, tinnitus, trouble swallowing and voice change.   Eyes: Negative.   Respiratory: Positive for cough. Negative for apnea, hemoptysis, choking, chest tightness, shortness of breath, wheezing and stridor.   Cardiovascular: Negative.  Negative for chest pain, palpitations and leg swelling.  Gastrointestinal: Negative.  Negative for heartburn, nausea, vomiting, abdominal pain, diarrhea, constipation and blood in stool.  Endocrine: Negative.   Genitourinary: Negative.   Musculoskeletal: Negative.  Negative for myalgias.  Skin: Negative.  Negative for rash.  Allergic/Immunologic: Negative.   Neurological: Negative.  Negative for headaches.  Hematological: Negative.  Negative for adenopathy. Does not bruise/bleed easily.  Psychiatric/Behavioral: Negative.        Objective:   Physical Exam  Vitals reviewed. Constitutional: She is oriented to person, place, and time. She  appears well-developed and well-nourished.  Non-toxic appearance. She does not have a sickly appearance. She does not appear ill. No distress.  HENT:  Head: Normocephalic and atraumatic.  Mouth/Throat: Oropharynx is clear and moist.  Eyes: Conjunctivae are normal. Right eye exhibits no discharge. Left eye exhibits no discharge. No scleral icterus.  Neck: Normal range of motion. Neck supple. No JVD present. No tracheal deviation present. No thyromegaly present.  Cardiovascular: Normal rate, regular rhythm, normal heart sounds and intact distal pulses.  Exam reveals no gallop and no friction rub.   No murmur heard. Pulmonary/Chest: Effort normal and breath sounds normal. No stridor. No respiratory distress. She has no wheezes. She has no rales. She exhibits no tenderness.  Abdominal: Soft. Bowel sounds are normal. She exhibits no distension and no mass. There is no tenderness. There is no rebound and no guarding.  Musculoskeletal: Normal range of motion. She exhibits no edema and no tenderness.  Lymphadenopathy:    She has no cervical adenopathy.  Neurological: She is oriented to person, place, and time.  Skin: Skin is warm and dry. No rash noted. She is not diaphoretic. No erythema. No pallor.  Psychiatric: She has a normal mood and affect. Her behavior is normal. Judgment and thought content normal.      Lab Results  Component Value Date   WBC 8.7 07/02/2012   HGB 15.6* 07/02/2012   HCT 46.0 07/02/2012   PLT 261 07/02/2012   GLUCOSE 158* 09/20/2012   CHOL 227* 09/20/2012   TRIG 307.0* 09/20/2012   HDL 52.00 09/20/2012   LDLDIRECT 132.7 09/20/2012   LDLCALC  Value: 132        Total Cholesterol/HDL:CHD Risk Coronary Heart Disease Risk Table  Men   Women  1/2 Average Risk   3.4   3.3  Average Risk       5.0   4.4  2 X Average Risk   9.6   7.1  3 X Average Risk  23.4   11.0        Use the calculated Patient Ratio above and the CHD Risk Table to determine the patient's CHD Risk.         ATP III CLASSIFICATION (LDL):  <100     mg/dL   Optimal  409-811  mg/dL   Near or Above                    Optimal  130-159  mg/dL   Borderline  914-782  mg/dL   High  >956     mg/dL   Very High* 04/07/863   ALT 31 09/20/2012   ALT 31 09/20/2012   AST 32 09/20/2012   AST 32 09/20/2012   NA 140 09/20/2012   K 5.1 09/20/2012   CL 101 09/20/2012   CREATININE 0.9 09/20/2012   BUN 18 09/20/2012   CO2 30 09/20/2012   TSH 1.779 09/09/2010   INR 2.1 10/19/2012   HGBA1C 8.4* 09/20/2012   MICROALBUR 15.0* 12/25/2008      Assessment & Plan:

## 2013-01-08 NOTE — Assessment & Plan Note (Signed)
Her Chest Xray is normal

## 2013-01-08 NOTE — Progress Notes (Signed)
Pre visit review using our clinic review tool, if applicable. No additional management support is needed unless otherwise documented below in the visit note. 

## 2013-01-08 NOTE — Assessment & Plan Note (Signed)
I will treat the infection with ceftin 

## 2013-01-11 DIAGNOSIS — H1030 Unspecified acute conjunctivitis, unspecified eye: Secondary | ICD-10-CM | POA: Diagnosis not present

## 2013-01-19 ENCOUNTER — Other Ambulatory Visit: Payer: Self-pay | Admitting: Internal Medicine

## 2013-01-19 DIAGNOSIS — Z1239 Encounter for other screening for malignant neoplasm of breast: Secondary | ICD-10-CM

## 2013-01-23 ENCOUNTER — Telehealth: Payer: Self-pay

## 2013-01-23 NOTE — Telephone Encounter (Signed)
Phone call to patient letting her know she is due for a Prevnar vaccine. She states she does not want one.

## 2013-01-29 ENCOUNTER — Telehealth: Payer: Self-pay

## 2013-01-29 ENCOUNTER — Encounter: Payer: Self-pay | Admitting: Internal Medicine

## 2013-01-29 ENCOUNTER — Ambulatory Visit (INDEPENDENT_AMBULATORY_CARE_PROVIDER_SITE_OTHER): Payer: Medicare Other | Admitting: Internal Medicine

## 2013-01-29 VITALS — BP 162/90 | HR 75 | Temp 98.2°F | Wt 261.0 lb

## 2013-01-29 DIAGNOSIS — J309 Allergic rhinitis, unspecified: Secondary | ICD-10-CM

## 2013-01-29 DIAGNOSIS — I10 Essential (primary) hypertension: Secondary | ICD-10-CM

## 2013-01-29 MED ORDER — FLUTICASONE PROPIONATE 50 MCG/ACT NA SUSP: 2.0000 | Freq: Every day | NASAL | Status: DC

## 2013-01-29 NOTE — Patient Instructions (Signed)
Sorry you are not feeling well today.   Your cough and congestion are most likely due to allergies not an active infection. This should not impact your ability to have shoulder surgery if necessary. The treatment for your allergies are antihistamines (zyrtec - over the counter - take as directed) and mucinex DM to help with your symptoms. Please continue the nasal sprays you are taking to help with your symptoms.   Please call back and let us know what nasal spray you are using.   Might attempt robitussin dm (a cough syrup) at night to help with your cough. We don't think a prescription cough syrup will help at this time.   We will refer you to the allergy clinic to try to further address your symptoms.

## 2013-01-29 NOTE — Progress Notes (Signed)
Pre visit review using our clinic review tool, if applicable. No additional management support is needed unless otherwise documented below in the visit note. 

## 2013-01-29 NOTE — Telephone Encounter (Signed)
Patient states she was to call in and give the name of the nasal spray she uses. It's Fluticasone Propionate 50 mcg.

## 2013-01-29 NOTE — Progress Notes (Signed)
Subjective:     Patient ID: [name], female   DOB: 03/30/1940, 72 y.o.   MRN: [id number]  HPI [name] is a 72 yo female with PMH of asthma, type II diabetes and a fib on warfarin who presents for complaints of an upper respiratory infection. She says her symptoms have been on and off since September. She has been on several different antibiotics, which helped but never resolved her symptoms.She feels tightness in her chest. She coughs up thick, yellow-gray sputum. When she blows her nose, she gets purulent drainage. She has ear pain, sore throat due to nasal drainage and headache. She has some nausea. She reports some diarrhea, but she thinks this could possibly be due to the antibiotics she has received. She endorses some hoarseness that progressively worsens over the course of the day. She is having to use her inhaler frequently (1 - 2x) since she got sick because she is wheezing more.  She has some taken some mucinex, which helps slightly. She denies any fever, chills or night sweats. She is recently recovering from conjunctivitis in her eyes.  She presented for the same problem on 9/17 to Dr. Posey Rea, where he prescribed a 10 day course of Ceftin. She presented again for the same problem on 11/17, where Dr. Yetta Barre prescribed ceftin, which did not improve her symptoms. A chest x-ray at that time was clear.   Chest xray 11/17: IMPRESSION:  No active cardiopulmonary disease.    In the past, she has saw Dr. Day Callas in the allergy clinic due to her history of allergies in 1999 or earlier. However, allergy shots led to her having bronchitis so she is unable to tolerate the shots.   She quit smoking for 21 years. She smoked for a total of 30 years (1 PPD).   She requests cough syrup and something for her congestion. She wants to feel well so she could undergo surgery on R shoulder if the orthopedic believe this is necessary.   Past Medical History  Diagnosis Date  . Allergic rhinitis   .  Asthma   . Gout     takes Colchicine prn  . Peptic ulcer disease   . History of PSVT (paroxysmal supraventricular tachycardia)   . HLD (hyperlipidemia)     pt doesn't take any medication for this  . DJD (degenerative joint disease)   . History of blood clots     right lung in early 90's  . Sleep apnea     doesn't use CPAP  . Shortness of breath     pt states that she can be sitting as well as exertion and get short of breath  . Bronchitis     in Jan 2013-uses Albuterol prn  . History of migraine headaches     last migraine 57yrs ago;Pt states she does get sinus headaches  . Fibromyalgia   . Osteoarthritis   . Chronic back pain     hx buldging disc  . Rash     on neck and started after she started taking her beta blocker 92yrs ago;medical MD has seen this  . GERD (gastroesophageal reflux disease)     uses Rolaids prn  . Gastric ulcer   . Hemorrhoids   . Family hx of colon cancer   . Diabetes mellitus     glimepiride  . Yeast infection of the vagina     being treated for this now by dr.fontaine  . Spinal headache     after 2nd child  .  Bronchitis, acute 03-01-11    FINSHED Z-PAK-STILL ON PREDNISONE NOW  . Hypertension     takes Benazepril and Diltiazem daily  . Atrial fibrillation     has been off of coumadin since 06/02/11 d/t surgery  . Peripheral edema     takes Furosemide every other day  . Dizziness     occasionally  . Constipation     takes Colace prn  . Colon cancer 2007    s/p surgery and chemo  . Urinary frequency   . Nocturia   . Anxiety     and panic attacks;pt states that she is claustrophobic   Past Surgical History  Procedure Laterality Date  . Caesarean section  1966/69/72    x 3  . Heel spur surgery  1992    x 2  . Ankle surgery      tumor-benign removed left ankle  . Lumbar disc surgery  2008  . Colectomy  2007    colon cancer  . Pyelonidal cystectomy      at age 21  . Knee arthrosocpy      bil;couple of years before knee replacement  .  Ganglion cyst excision  > 51yrs ago    removed from left pointer finger  . Cataract surgery  2010  . Tubal ligation  1972  . Cardiac catheterization  90's and 2005  . Port a cath placed  2008  . Port a cath removed  2008  . Knee surgery      left TKA  . Anterior cervical decomp/discectomy fusion  01/12/2011    Procedure: ANTERIOR CERVICAL DECOMPRESSION/DISCECTOMY FUSION 2 LEVELS;  Surgeon: Kathaleen Maser Pool;  Location: [facility];  Service: Neurosurgery;  Laterality: Bilateral;  Cervical five-six, six-seven anterior cervical discectomy and fusion with allograft and plating  . Hysteroscopy w/d&c  02/19/2011    Procedure: DILATATION AND CURETTAGE /HYSTEROSCOPY;  Surgeon: Dara Lords, MD;  Location: [facility];  Service: Gynecology;  Laterality: N/A;  requests one hour  . Dilation and curettage of uterus  02-19-11    & POLYP REMOVAL  . Anterior cervical decomp/discectomy fusion  06/11/2011    Procedure: ANTERIOR CERVICAL DECOMPRESSION/DISCECTOMY FUSION 1 LEVEL/HARDWARE REMOVAL;  Surgeon: Temple Pacini, MD;  Location: [facility];  Service: Neurosurgery;  Laterality: N/A;  Cervical four-five anterior cervical decompression fusion with allograft and plating; Removal of Cervical five to seven plate   Family History  Problem Relation Age of Onset  . Coronary artery disease Other   . Heart attack Mother   . Stroke Mother   . Hypertension Mother   . Heart disease Father   . Hypertension Sister   . Cancer Sister     left kidney cancer  . Cancer Brother     bladder  . Cancer Brother     liver, spread to colon  . Liver cancer Brother   . Anesthesia problems Neg Hx   . Hypotension Neg Hx   . Malignant hyperthermia Neg Hx   . Pseudochol deficiency Neg Hx    History   Social History  . Marital Status: Married    Spouse Name: N/A    Number of Children: 3  . Years of Education: N/A   Occupational History  . retired    Social History Main Topics  . Smoking status: Former Smoker -- 1.50  packs/day for 30 years    Types: Cigarettes    Quit date: 01/07/1992  . Smokeless tobacco: Never Used     Comment:  quit 19 years ago  . Alcohol Use: No  . Drug Use: No  . Sexual Activity: Yes    Partners: Male    Birth Control/ Protection: Post-menopausal   Other Topics Concern  . Not on file   Social History Narrative   Regular exercise- no.    Current Outpatient Prescriptions on File Prior to Visit  Medication Sig Dispense Refill  . ALPRAZolam (XANAX) 0.5 MG tablet Take 1 tablet (0.5 mg total) by mouth 3 (three) times daily as needed for sleep or anxiety. Take 1 tab every 6hrs prn  50 tablet  1  . cetirizine (ZYRTEC) 10 MG tablet Take 1 tablet (10 mg total) by mouth daily.  30 tablet  11  . colchicine 0.6 MG tablet Take 0.6 mg by mouth daily as needed. gout      . diltiazem (TIAZAC) 240 MG 24 hr capsule Take 1 capsule (240 mg total) by mouth daily.  30 capsule  6  . furosemide (LASIX) 40 MG tablet TAKE 1 TABLET EVERY DAY  30 tablet  11  . glimepiride (AMARYL) 4 MG tablet TAKE 1 TABLET BY MOUTH ONCE A DAY  30 tablet  5  . loperamide (IMODIUM) 2 MG capsule Take 2 mg by mouth 4 (four) times daily as needed. Loose stools      . ONE TOUCH ULTRA TEST test strip TEST ONCE DAILY  100 each  3  . oxyCODONE-acetaminophen (PERCOCET/ROXICET) 5-325 MG per tablet Take 1 tablet by mouth every 4 (four) hours as needed. pain  60 tablet  0  . oxyCODONE-acetaminophen (PERCOCET/ROXICET) 5-325 MG per tablet Take 1 tablet by mouth every 4 (four) hours as needed. pain  60 tablet  0  . oxyCODONE-acetaminophen (PERCOCET/ROXICET) 5-325 MG per tablet Take 1 tablet by mouth every 4 (four) hours as needed. pain  60 tablet  0  . promethazine (PHENERGAN) 25 MG tablet Take 1 tablet (25 mg total) by mouth every 6 (six) hours as needed. Only takes for severe nausea  60 tablet  3  . promethazine-codeine (PHENERGAN WITH CODEINE) 6.25-10 MG/5ML syrup Take 5 mLs by mouth every 4 (four) hours as needed for cough.  120 mL   2  . promethazine-dextromethorphan (PROMETHAZINE-DM) 6.25-15 MG/5ML syrup Take 5 mLs by mouth 4 (four) times daily as needed for cough.  118 mL  0  . telmisartan (MICARDIS) 40 MG tablet TAKE 1 TABLET BY MOUTH DAILY.  30 tablet  5  . VENTOLIN HFA 108 (90 BASE) MCG/ACT inhaler USE AS DIRECTED  18 each  6  . warfarin (COUMADIN) 5 MG tablet Take as directed by coumadin clinic  30 tablet  3  . [DISCONTINUED] digoxin (LANOXIN) 0.125 MG tablet Take 125 mcg by mouth daily.       No current facility-administered medications on file prior to visit.      Review of Systems System review is negative for any constitutional, cardiac, pulmonary, GI or neuro symptoms or complaints other than as described in the HPI.     Objective:   Physical Exam HEENT: Antiicteric sclerae. PERRL. Tympanic membranes clear. MMM. Nares patent. No cervical lymphadenopathy. No thyromegaly. No maxillary or frontal sinus tenderness. CV: RRR. No murmurs, rubs or gallops. 2+ radial pulse bilaterally.  Pulm: Lungs clear to ausculatation bilaterally. No crackles, wheezes or rales. Normal work of breathing.  Abd: Soft, non-tender to palpation. Bowel sounds present. Neuro: AOx3. EOM intact without nystagmus. Facial sensation and strength intact bilaterally.  Tongue mid-line without deviation.  Assessment and Plan:   Mrs. Goin is a 72 yo female with PMH of asthma, type II diabetes and a fib on warfarin who presents for complaints of a recurrent congestion and cough that has been present for 4 months.   1) Cough and congestion: Most likely secondary to allergies given the prolonged course and the fact her symptoms did not respond to two rounds of ceftin. Furthermore, she does not have any symptoms of infection (fever, chills, night sweats).  Advised patient to try zyrtec and mucinex DM. Additionally, told patient to continue to take the nasal spray she was prescribed (exact drug is unknown).  Told patient did not want to try an  prescription cough syrup since she already takes opioids.  Referral to allergy clinic for further workup.   2) Blood Pressure was slightly elevated today (162/90). Her blood pressure is usually well controlled at home. No changes at this time.

## 2013-01-30 NOTE — Assessment & Plan Note (Signed)
Cough and congestion: Most likely secondary to allergies given the prolonged course and the fact her symptoms did not respond to two rounds of ceftin. Furthermore, she does not have any symptoms of infection (fever, chills, night sweats).  Advised patient to try zyrtec and mucinex DM. Additionally, told patient to continue to take the nasal spray she was prescribed (exact drug is unknown).  Told patient did not want to try an prescription cough syrup since she already takes opioids.  Referral to allergy clinic for further workup.

## 2013-01-30 NOTE — Assessment & Plan Note (Signed)
Blood Pressure was slightly elevated today (162/90). Her blood pressure is usually well controlled at home. No changes at this time.

## 2013-02-05 ENCOUNTER — Ambulatory Visit (INDEPENDENT_AMBULATORY_CARE_PROVIDER_SITE_OTHER): Payer: Medicare Other | Admitting: Internal Medicine

## 2013-02-05 ENCOUNTER — Encounter: Payer: Self-pay | Admitting: Internal Medicine

## 2013-02-05 ENCOUNTER — Ambulatory Visit (INDEPENDENT_AMBULATORY_CARE_PROVIDER_SITE_OTHER): Payer: Medicare Other | Admitting: *Deleted

## 2013-02-05 VITALS — BP 154/84 | HR 76 | Ht 63.5 in | Wt 260.8 lb

## 2013-02-05 DIAGNOSIS — E785 Hyperlipidemia, unspecified: Secondary | ICD-10-CM

## 2013-02-05 DIAGNOSIS — Z7901 Long term (current) use of anticoagulants: Secondary | ICD-10-CM | POA: Diagnosis not present

## 2013-02-05 DIAGNOSIS — I4891 Unspecified atrial fibrillation: Secondary | ICD-10-CM | POA: Diagnosis not present

## 2013-02-05 LAB — BASIC METABOLIC PANEL
CO2: 30 mEq/L (ref 19–32)
Chloride: 100 mEq/L (ref 96–112)
Potassium: 3.8 mEq/L (ref 3.5–5.1)
Sodium: 137 mEq/L (ref 135–145)

## 2013-02-05 LAB — CBC
HCT: 40.9 % (ref 36.0–46.0)
Hemoglobin: 13.9 g/dL (ref 12.0–15.0)
Platelets: 258 10*3/uL (ref 150.0–400.0)
RBC: 4.82 Mil/uL (ref 3.87–5.11)
WBC: 9.3 10*3/uL (ref 4.5–10.5)

## 2013-02-05 LAB — POCT INR: INR: 2

## 2013-02-05 NOTE — Progress Notes (Signed)
HPI:   Patient is a 72 yo who was previously followed by Ermalene Postin  She has a history of atrial fibrillatoin and hypertension.  She was last in cardiology clinic in June 2013 She dnies palptations.  NO CP  Breathing is stable  No signif SOB  Current Outpatient Prescriptions  Medication Sig Dispense Refill  . ALPRAZolam (XANAX) 0.5 MG tablet Take 1 tablet (0.5 mg total) by mouth 3 (three) times daily as needed for sleep or anxiety. Take 1 tab every 6hrs prn  50 tablet  1  . colchicine 0.6 MG tablet Take 0.6 mg by mouth daily as needed. gout      . diltiazem (DILACOR XR) 240 MG 24 hr capsule Take 240 mg by mouth daily.      [name] diltiazem (TIAZAC) 240 MG 24 hr capsule Take 1 capsule (240 mg total) by mouth daily.  30 capsule  6  . fluticasone (FLONASE) 50 MCG/ACT nasal spray Place 2 sprays into both nostrils daily.  16 g  6  . furosemide (LASIX) 40 MG tablet TAKE 1 TABLET EVERY DAY  30 tablet  11  . glimepiride (AMARYL) 4 MG tablet TAKE 1 TABLET BY MOUTH ONCE A DAY  30 tablet  5  . loperamide (IMODIUM) 2 MG capsule Take 2 mg by mouth 4 (four) times daily as needed. Loose stools      . ONE TOUCH ULTRA TEST test strip TEST ONCE DAILY  100 each  3  . oxyCODONE-acetaminophen (PERCOCET/ROXICET) 5-325 MG per tablet Take 1 tablet by mouth every 4 (four) hours as needed. pain  60 tablet  0  . promethazine (PHENERGAN) 25 MG tablet Take 1 tablet (25 mg total) by mouth every 6 (six) hours as needed. Only takes for severe nausea  60 tablet  3  . telmisartan (MICARDIS) 40 MG tablet TAKE 1 TABLET BY MOUTH DAILY.  30 tablet  5  . VENTOLIN HFA 108 (90 BASE) MCG/ACT inhaler USE AS DIRECTED  18 each  6  . warfarin (COUMADIN) 5 MG tablet Take as directed by coumadin clinic  30 tablet  3  . [DISCONTINUED] digoxin (LANOXIN) 0.125 MG tablet Take 125 mcg by mouth daily.       No current facility-administered medications for this visit.    Allergies  Allergen Reactions  . Benazepril Other (See Comments)   coughing  . Adhesive [Tape] Other (See Comments)    redness  . Morphine Other (See Comments)       Makes you feel bad all over  . Nifedipine Hives and Itching  . Piroxicam Hives and Itching    Past Medical History  Diagnosis Date  . Allergic rhinitis   . Asthma   . Gout     takes Colchicine prn  . Peptic ulcer disease   . History of PSVT (paroxysmal supraventricular tachycardia)   . HLD (hyperlipidemia)     pt doesn't take any medication for this  . DJD (degenerative joint disease)   . History of blood clots     right lung in early 90's  . Sleep apnea     doesn't use CPAP  . Shortness of breath     pt states that she can be sitting as well as exertion and get short of breath  . Bronchitis     in Jan 2013-uses Albuterol prn  . History of migraine headaches     last migraine 106yrs ago;Pt states she does get sinus headaches  . Fibromyalgia   .  Osteoarthritis   . Chronic back pain     hx buldging disc  . Rash     on neck and started after she started taking her beta blocker 89yrs ago;medical MD has seen this  . GERD (gastroesophageal reflux disease)     uses Rolaids prn  . Gastric ulcer   . Hemorrhoids   . Family hx of colon cancer   . Diabetes mellitus     glimepiride  . Yeast infection of the vagina     being treated for this now by dr.fontaine  . Spinal headache     after 2nd child  . Bronchitis, acute 03-01-11    FINSHED Z-PAK-STILL ON PREDNISONE NOW  . Hypertension     takes Benazepril and Diltiazem daily  . Atrial fibrillation     has been off of coumadin since 06/02/11 d/t surgery  . Peripheral edema     takes Furosemide every other day  . Dizziness     occasionally  . Constipation     takes Colace prn  . Colon cancer 2007    s/p surgery and chemo  . Urinary frequency   . Nocturia   . Anxiety     and panic attacks;pt states that she is claustrophobic    Past Surgical History  Procedure Laterality Date  . Caesarean section  1966/69/72    x 3  .  Heel spur surgery  1992    x 2  . Ankle surgery      tumor-benign removed left ankle  . Lumbar disc surgery  2008  . Colectomy  2007    colon cancer  . Pyelonidal cystectomy      at age 72  . Knee arthrosocpy      bil;couple of years before knee replacement  . Ganglion cyst excision  > 32yrs ago    removed from left pointer finger  . Cataract surgery  2010  . Tubal ligation  1972  . Cardiac catheterization  90's and 2005  . Port a cath placed  2008  . Port a cath removed  2008  . Knee surgery      left TKA  . Anterior cervical decomp/discectomy fusion  01/12/2011    Procedure: ANTERIOR CERVICAL DECOMPRESSION/DISCECTOMY FUSION 2 LEVELS;  Surgeon: Kathaleen Maser Pool;  Location: [facility];  Service: Neurosurgery;  Laterality: Bilateral;  Cervical five-six, six-seven anterior cervical discectomy and fusion with allograft and plating  . Hysteroscopy w/d&c  02/19/2011    Procedure: DILATATION AND CURETTAGE /HYSTEROSCOPY;  Surgeon: Dara Lords, MD;  Location: [facility];  Service: Gynecology;  Laterality: N/A;  requests one hour  . Dilation and curettage of uterus  02-19-11    & POLYP REMOVAL  . Anterior cervical decomp/discectomy fusion  06/11/2011    Procedure: ANTERIOR CERVICAL DECOMPRESSION/DISCECTOMY FUSION 1 LEVEL/HARDWARE REMOVAL;  Surgeon: Temple Pacini, MD;  Location: [facility];  Service: Neurosurgery;  Laterality: N/A;  Cervical four-five anterior cervical decompression fusion with allograft and plating; Removal of Cervical five to seven plate    Family History  Problem Relation Age of Onset  . Coronary artery disease Other   . Heart attack Mother   . Stroke Mother   . Hypertension Mother   . Heart disease Father   . Hypertension Sister   . Cancer Sister     left kidney cancer  . Cancer Brother     bladder  . Cancer Brother     liver, spread to colon  . Liver  cancer Brother   . Anesthesia problems Neg Hx   . Hypotension Neg Hx   . Malignant hyperthermia Neg Hx   .  Pseudochol deficiency Neg Hx     History   Social History  . Marital Status: Married    Spouse Name: N/A    Number of Children: 3  . Years of Education: N/A   Occupational History  . retired    Social History Main Topics  . Smoking status: Former Smoker -- 1.50 packs/day for 30 years    Types: Cigarettes    Quit date: 01/07/1992  . Smokeless tobacco: Never Used     Comment: quit 19 years ago  . Alcohol Use: No  . Drug Use: No  . Sexual Activity: Yes    Partners: Male    Birth Control/ Protection: Post-menopausal   Other Topics Concern  . Not on file   Social History Narrative   Regular exercise- no.    ROS: Please see the HPI.  All other systems reviewed and negative.  PHYSICAL EXAM:  BP 154/84  Pulse 76  Ht 5' 3.5" (1.613 m)  Wt 260 lb 12.8 oz (118.298 kg)  BMI 45.47 kg/m2  LMP 12/30/2000  General: Well developed, well nourished, in no acute distress. Head:  Normocephalic and atraumatic. Neck: no JVD Lungs: Clear to auscultation and percussion. Heart: Normal S1 and S2.  No murmur, rubs or gallops.  Pulses: Pulses normal in all 4 extremities. Extremities: No clubbing or cyanosis. No edema. Neurologic: Alert and oriented x 3.  EKG:  NSR.    76 bpm  ASSESSMENT AND PLAN:  1/  Atrial fib.  Continue on current regimen  Will have INR checked  2.  HTN  BP is mildy increased   today  Will need to follow  I would not make changes on one value  Get labs.

## 2013-02-05 NOTE — Patient Instructions (Signed)
Your physician recommends that you return for lab work in: today bmet cbc  Your physician wants you to follow-up in: 1 year  You will receive a reminder letter in the mail two months in advance. If you don't receive a letter, please call our office to schedule the follow-up appointment.  Your physician recommends that you continue on your current medications as directed. Please refer to the Current Medication list given to you today.

## 2013-02-09 ENCOUNTER — Other Ambulatory Visit: Payer: Self-pay | Admitting: Internal Medicine

## 2013-02-13 ENCOUNTER — Telehealth: Payer: Self-pay | Admitting: Internal Medicine

## 2013-02-13 NOTE — Telephone Encounter (Signed)
Received request from Nurse fax box, documents faxed for surgical clearance. To: [facility] Fax number: [phone] Attention: 02/13/13/KM

## 2013-02-27 ENCOUNTER — Telehealth: Payer: Self-pay | Admitting: Pharmacist

## 2013-02-27 NOTE — Telephone Encounter (Signed)
Message copied by Aris Georgia on Tue Feb 27, 2013 10:56 AM ------      Message from: Dorris Carnes V      Created: Mon Feb 26, 2013  4:25 PM       Yes  Ok to hold coumadin  Has done in past.        ----- Message -----         From: Aris Georgia, [facility]         Sent: 02/26/2013   3:04 PM           To: Fay Records, MD            Pt has orthopedic procedure later this month.  You signed clearance for procedure but no notes regarding Coumadin were mentioned.  Is pt cleared to hold Coumadin as well?         ------

## 2013-02-28 NOTE — Telephone Encounter (Signed)
Spoke with pt.  She is aware of Dr. Alan Ripper recommendation.  Dr. Gilberto Better office would like INR the day prior to procedure so appt set for 1/22.  Will fax results to [phone].

## 2013-03-05 ENCOUNTER — Encounter (HOSPITAL_COMMUNITY): Payer: Self-pay | Admitting: Respiratory Therapy

## 2013-03-07 ENCOUNTER — Other Ambulatory Visit: Payer: Self-pay | Admitting: *Deleted

## 2013-03-07 MED ORDER — DILTIAZEM HCL ER BEADS 240 MG PO CP24: 240.0000 mg | ORAL_CAPSULE | Freq: Every day | ORAL | Status: DC

## 2013-03-08 ENCOUNTER — Encounter (HOSPITAL_COMMUNITY): Payer: Self-pay | Admitting: Emergency Medicine

## 2013-03-08 ENCOUNTER — Telehealth: Payer: Self-pay | Admitting: Internal Medicine

## 2013-03-08 ENCOUNTER — Emergency Department (HOSPITAL_COMMUNITY): Payer: Medicare Other

## 2013-03-08 ENCOUNTER — Inpatient Hospital Stay (HOSPITAL_COMMUNITY)
Admission: RE | Admit: 2013-03-08 | Discharge: 2013-03-08 | Disposition: A | Discharge status: Home / Self Care | Payer: Medicare Other | Source: Ambulatory Visit

## 2013-03-08 ENCOUNTER — Emergency Department (HOSPITAL_COMMUNITY)
Admission: EM | Admit: 2013-03-08 | Discharge: 2013-03-08 | Disposition: A | Discharge status: Home / Self Care | Payer: Medicare Other | Attending: Emergency Medicine | Admitting: Emergency Medicine

## 2013-03-08 DIAGNOSIS — J301 Allergic rhinitis due to pollen: Secondary | ICD-10-CM | POA: Insufficient documentation

## 2013-03-08 DIAGNOSIS — B3731 Acute candidiasis of vulva and vagina: Secondary | ICD-10-CM | POA: Insufficient documentation

## 2013-03-08 DIAGNOSIS — Z888 Allergy status to other drugs, medicaments and biological substances status: Secondary | ICD-10-CM | POA: Insufficient documentation

## 2013-03-08 DIAGNOSIS — M549 Dorsalgia, unspecified: Secondary | ICD-10-CM | POA: Diagnosis not present

## 2013-03-08 DIAGNOSIS — B373 Candidiasis of vulva and vagina: Secondary | ICD-10-CM | POA: Diagnosis not present

## 2013-03-08 DIAGNOSIS — I10 Essential (primary) hypertension: Secondary | ICD-10-CM | POA: Insufficient documentation

## 2013-03-08 DIAGNOSIS — Z800 Family history of malignant neoplasm of digestive organs: Secondary | ICD-10-CM | POA: Insufficient documentation

## 2013-03-08 DIAGNOSIS — IMO0001 Reserved for inherently not codable concepts without codable children: Secondary | ICD-10-CM | POA: Insufficient documentation

## 2013-03-08 DIAGNOSIS — Z85038 Personal history of other malignant neoplasm of large intestine: Secondary | ICD-10-CM | POA: Insufficient documentation

## 2013-03-08 DIAGNOSIS — R0602 Shortness of breath: Secondary | ICD-10-CM | POA: Insufficient documentation

## 2013-03-08 DIAGNOSIS — I4891 Unspecified atrial fibrillation: Secondary | ICD-10-CM | POA: Diagnosis not present

## 2013-03-08 DIAGNOSIS — R42 Dizziness and giddiness: Secondary | ICD-10-CM | POA: Insufficient documentation

## 2013-03-08 DIAGNOSIS — K648 Other hemorrhoids: Secondary | ICD-10-CM | POA: Insufficient documentation

## 2013-03-08 DIAGNOSIS — R351 Nocturia: Secondary | ICD-10-CM | POA: Diagnosis not present

## 2013-03-08 DIAGNOSIS — M1990 Unspecified osteoarthritis, unspecified site: Secondary | ICD-10-CM | POA: Diagnosis not present

## 2013-03-08 DIAGNOSIS — M109 Gout, unspecified: Secondary | ICD-10-CM | POA: Insufficient documentation

## 2013-03-08 DIAGNOSIS — J40 Bronchitis, not specified as acute or chronic: Secondary | ICD-10-CM | POA: Diagnosis not present

## 2013-03-08 DIAGNOSIS — G8929 Other chronic pain: Secondary | ICD-10-CM | POA: Insufficient documentation

## 2013-03-08 DIAGNOSIS — R21 Rash and other nonspecific skin eruption: Secondary | ICD-10-CM | POA: Insufficient documentation

## 2013-03-08 DIAGNOSIS — K219 Gastro-esophageal reflux disease without esophagitis: Secondary | ICD-10-CM | POA: Insufficient documentation

## 2013-03-08 DIAGNOSIS — E785 Hyperlipidemia, unspecified: Secondary | ICD-10-CM | POA: Diagnosis not present

## 2013-03-08 DIAGNOSIS — R350 Frequency of micturition: Secondary | ICD-10-CM | POA: Diagnosis not present

## 2013-03-08 DIAGNOSIS — Z7901 Long term (current) use of anticoagulants: Secondary | ICD-10-CM | POA: Insufficient documentation

## 2013-03-08 DIAGNOSIS — F411 Generalized anxiety disorder: Secondary | ICD-10-CM | POA: Diagnosis not present

## 2013-03-08 DIAGNOSIS — G971 Other reaction to spinal and lumbar puncture: Secondary | ICD-10-CM | POA: Diagnosis not present

## 2013-03-08 DIAGNOSIS — Z886 Allergy status to analgesic agent status: Secondary | ICD-10-CM | POA: Insufficient documentation

## 2013-03-08 DIAGNOSIS — J209 Acute bronchitis, unspecified: Secondary | ICD-10-CM | POA: Insufficient documentation

## 2013-03-08 DIAGNOSIS — Z8669 Personal history of other diseases of the nervous system and sense organs: Secondary | ICD-10-CM | POA: Insufficient documentation

## 2013-03-08 DIAGNOSIS — K250 Acute gastric ulcer with hemorrhage: Secondary | ICD-10-CM | POA: Diagnosis not present

## 2013-03-08 DIAGNOSIS — K5900 Constipation, unspecified: Secondary | ICD-10-CM | POA: Diagnosis not present

## 2013-03-08 DIAGNOSIS — Z79899 Other long term (current) drug therapy: Secondary | ICD-10-CM | POA: Insufficient documentation

## 2013-03-08 DIAGNOSIS — Z86718 Personal history of other venous thrombosis and embolism: Secondary | ICD-10-CM | POA: Insufficient documentation

## 2013-03-08 LAB — BASIC METABOLIC PANEL
BUN: 18 mg/dL (ref 6–23)
CO2: 26 meq/L (ref 19–32)
CREATININE: 0.82 mg/dL (ref 0.50–1.10)
Calcium: 9.4 mg/dL (ref 8.4–10.5)
Chloride: 100 mEq/L (ref 96–112)
GFR calc Af Amer: 82 mL/min — ABNORMAL LOW (ref >90)
GFR calc non Af Amer: 71 mL/min — ABNORMAL LOW (ref >90)
Glucose, Bld: 156 mg/dL — ABNORMAL HIGH (ref 70–99)
Potassium: 4.7 mEq/L (ref 3.7–5.3)
Sodium: 141 mEq/L (ref 137–147)

## 2013-03-08 LAB — PROTIME-INR
INR: 1.97 — ABNORMAL HIGH (ref 0.00–1.49)
Prothrombin Time: 21.8 seconds — ABNORMAL HIGH (ref 11.6–15.2)

## 2013-03-08 LAB — CBC
HCT: 45.9 % (ref 36.0–46.0)
Hemoglobin: 15.3 g/dL — ABNORMAL HIGH (ref 12.0–15.0)
MCH: 29.8 pg (ref 26.0–34.0)
MCHC: 33.3 g/dL (ref 30.0–36.0)
MCV: 89.3 fL (ref 78.0–100.0)
PLATELETS: 238 10*3/uL (ref 150–400)
RBC: 5.14 MIL/uL — ABNORMAL HIGH (ref 3.87–5.11)
RDW: 15.1 % (ref 11.5–15.5)
WBC: 11.4 10*3/uL — ABNORMAL HIGH (ref 4.0–10.5)

## 2013-03-08 LAB — POCT I-STAT TROPONIN I: Troponin i, poc: 0.06 ng/mL (ref 0.00–0.08)

## 2013-03-08 LAB — PRO B NATRIURETIC PEPTIDE: PRO B NATRI PEPTIDE: 747.1 pg/mL — AB (ref 0–125)

## 2013-03-08 NOTE — Progress Notes (Signed)
Dr Norris's office made aware that orders were needed for patient's PAT appointment at 1200.

## 2013-03-08 NOTE — ED Notes (Signed)
Patient is alert and orientedx4.  Patient was explained discharge instructions and they understood them with no questions.  The patient's husband, Levada Bowersox is taking the patient home.

## 2013-03-08 NOTE — ED Provider Notes (Signed)
CSN: [id number]     Arrival date & time 03/08/13  1046 History   First MD Initiated Contact with Patient 03/08/13 1121     Chief Complaint  Patient presents with  . Atrial Fibrillation  . Shortness of Breath   (Consider location/radiation/quality/duration/timing/severity/associated sxs/prior Treatment) Patient is a 73 y.o. female presenting with atrial fibrillation and shortness of breath. The history is provided by the patient and the spouse.  Atrial Fibrillation Associated symptoms include chest pain and shortness of breath. Pertinent negatives include no abdominal pain and no headaches.  Shortness of Breath Associated symptoms: chest pain   Associated symptoms: no abdominal pain, no fever, no headaches and no rash    patient with onset of rapid heart rate elevated blood pressure heart rate was 1:30 to 140. Patient took her diltiazem a.m. and 5 in the morning. Patient has a known history of atrial fib followed by cardiology. Patient told nursing that she had some mild chest pain shortness of breath. Patient denied chest pain to me.  Past Medical History  Diagnosis Date  . Allergic rhinitis   . Asthma   . Gout     takes Colchicine prn  . Peptic ulcer disease   . History of PSVT (paroxysmal supraventricular tachycardia)   . HLD (hyperlipidemia)     pt doesn't take any medication for this  . DJD (degenerative joint disease)   . History of blood clots     right lung in early 90's  . Sleep apnea     doesn't use CPAP  . Shortness of breath     pt states that she can be sitting as well as exertion and get short of breath  . Bronchitis     in Jan 2013-uses Albuterol prn  . History of migraine headaches     last migraine 8yrs ago;Pt states she does get sinus headaches  . Fibromyalgia   . Osteoarthritis   . Chronic back pain     hx buldging disc  . Rash     on neck and started after she started taking her beta blocker 56yrs ago;medical MD has seen this  . GERD (gastroesophageal  reflux disease)     uses Rolaids prn  . Gastric ulcer   . Hemorrhoids   . Family hx of colon cancer   . Diabetes mellitus     glimepiride  . Yeast infection of the vagina     being treated for this now by dr.fontaine  . Spinal headache     after 2nd child  . Bronchitis, acute 03-01-11    FINSHED Z-PAK-STILL ON PREDNISONE NOW  . Hypertension     takes Benazepril and Diltiazem daily  . Atrial fibrillation     has been off of coumadin since 06/02/11 d/t surgery  . Peripheral edema     takes Furosemide every other day  . Dizziness     occasionally  . Constipation     takes Colace prn  . Colon cancer 2007    s/p surgery and chemo  . Urinary frequency   . Nocturia   . Anxiety     and panic attacks;pt states that she is claustrophobic   Past Surgical History  Procedure Laterality Date  . Caesarean section  1966/69/72    x 3  . Heel spur surgery  1992    x 2  . Ankle surgery      tumor-benign removed left ankle  . Lumbar disc surgery  2008  . Colectomy  2007  colon cancer  . Pyelonidal cystectomy      at age 71  . Knee arthrosocpy      bil;couple of years before knee replacement  . Ganglion cyst excision  > 86yrs ago    removed from left pointer finger  . Cataract surgery  2010  . Tubal ligation  1972  . Cardiac catheterization  90's and 2005  . Port a cath placed  2008  . Port a cath removed  2008  . Knee surgery      left TKA  . Anterior cervical decomp/discectomy fusion  01/12/2011    Procedure: ANTERIOR CERVICAL DECOMPRESSION/DISCECTOMY FUSION 2 LEVELS;  Surgeon: Cooper Render Pool;  Location: [place] [facility];  Service: Neurosurgery;  Laterality: Bilateral;  Cervical five-six, six-seven anterior cervical discectomy and fusion with allograft and plating  . Hysteroscopy w/d&c  02/19/2011    Procedure: DILATATION AND CURETTAGE /HYSTEROSCOPY;  Surgeon: Anastasio Auerbach, MD;  Location: [place] [facility] [facility];  Service: Gynecology;  Laterality: N/A;  requests one hour  . Dilation and  curettage of uterus  02-19-11    & POLYP REMOVAL  . Anterior cervical decomp/discectomy fusion  06/11/2011    Procedure: ANTERIOR CERVICAL DECOMPRESSION/DISCECTOMY FUSION 1 LEVEL/HARDWARE REMOVAL;  Surgeon: Charlie Pitter, MD;  Location: [place] [facility];  Service: Neurosurgery;  Laterality: N/A;  Cervical four-five anterior cervical decompression fusion with allograft and plating; Removal of Cervical five to seven plate   Family History  Problem Relation Age of Onset  . Coronary artery disease Other   . Heart attack Mother   . Stroke Mother   . Hypertension Mother   . Heart disease Father   . Hypertension Sister   . Cancer Sister     left kidney cancer  . Cancer Brother     bladder  . Cancer Brother     liver, spread to colon  . Liver cancer Brother   . Anesthesia problems Neg Hx   . Hypotension Neg Hx   . Malignant hyperthermia Neg Hx   . Pseudochol deficiency Neg Hx    History  Substance Use Topics  . Smoking status: Former Smoker -- 1.50 packs/day for 30 years    Types: Cigarettes    Quit date: 01/07/1992  . Smokeless tobacco: Never Used     Comment: quit 19 years ago  . Alcohol Use: No   OB History   Grav Para Term Preterm Abortions TAB SAB Ect Mult Living   3 3        3      Review of Systems  Constitutional: Negative for fever.  HENT: Negative for congestion.   Eyes: Negative for redness.  Respiratory: Positive for shortness of breath.   Cardiovascular: Positive for chest pain and palpitations.  Gastrointestinal: Negative for abdominal pain.  Genitourinary: Negative for dysuria.  Musculoskeletal: Negative for back pain.  Skin: Negative for rash.  Neurological: Negative for headaches.  Hematological: Bruises/bleeds easily.  Psychiatric/Behavioral: Negative for confusion.    Allergies  Benazepril; Adhesive; Morphine; Nifedipine; and Piroxicam  Home Medications   Current Outpatient Rx  Name  Route  Sig  Dispense  Refill  . albuterol (PROVENTIL HFA;VENTOLIN  HFA) 108 (90 BASE) MCG/ACT inhaler   Inhalation   Inhale 2 puffs into the lungs every 6 (six) hours as needed for wheezing or shortness of breath.         . ALPRAZolam (XANAX) 0.5 MG tablet   Oral   Take 1 tablet (0.5 mg total) by mouth 3 (three) times  daily as needed for sleep or anxiety. Take 1 tab every 6hrs prn   50 tablet   1   . colchicine 0.6 MG tablet   Oral   Take 0.6 mg by mouth daily as needed (for gout flare up).          . diltiazem (TIAZAC) 240 MG 24 hr capsule   Oral   Take 1 capsule (240 mg total) by mouth daily.   30 capsule   6   . fluticasone (FLONASE) 50 MCG/ACT nasal spray   Each Nare   Place 2 sprays into both nostrils daily.   16 g   6   . furosemide (LASIX) 40 MG tablet   Oral   Take 40 mg by mouth daily.         [name] glimepiride (AMARYL) 4 MG tablet   Oral   Take 4 mg by mouth daily with breakfast.         . loperamide (IMODIUM) 2 MG capsule   Oral   Take 2 mg by mouth 4 (four) times daily as needed. Loose stools         . oxyCODONE-acetaminophen (PERCOCET/ROXICET) 5-325 MG per tablet   Oral   Take 1 tablet by mouth every 4 (four) hours as needed. pain   60 tablet   0   . promethazine (PHENERGAN) 25 MG tablet   Oral   Take 1 tablet (25 mg total) by mouth every 6 (six) hours as needed. Only takes for severe nausea   60 tablet   3   . telmisartan (MICARDIS) 40 MG tablet   Oral   Take 40 mg by mouth daily.         [name] warfarin (COUMADIN) 5 MG tablet   Oral   Take 2.5-5 mg by mouth daily. Take 5mg  on Sunday and Thursday. Take 2.5mg  the rest of the week          BP 99/58  Pulse 72  Temp(Src) 98.1 F (36.7 C) (Oral)  Resp 17  Ht 5\' 3"  (1.6 m)  Wt 257 lb 6.4 oz (116.756 kg)  BMI 45.61 kg/m2  SpO2 93%  LMP 12/30/2000 Physical Exam  Nursing note and vitals reviewed. Constitutional: She appears well-developed and well-nourished. No distress.  HENT:  Head: Normocephalic and atraumatic.  Mouth/Throat: Oropharynx is clear  and moist.  Eyes: Conjunctivae and EOM are normal. Pupils are equal, round, and reactive to light.  Neck: Normal range of motion.  Cardiovascular: Normal rate and normal heart sounds.   No murmur heard. Irregular  Pulmonary/Chest: Effort normal and breath sounds normal. No respiratory distress.  Abdominal: Soft. Bowel sounds are normal. There is no tenderness.  Musculoskeletal: Normal range of motion. She exhibits no edema.  Neurological: She is alert. No cranial nerve deficit. She exhibits normal muscle tone. Coordination normal.  Skin: Skin is warm. No rash noted.    ED Course  Procedures (including critical care time) Labs Review Labs Reviewed  CBC - Abnormal; Notable for the following:    WBC 11.4 (*)    RBC 5.14 (*)    Hemoglobin 15.3 (*)    All other components within normal limits  BASIC METABOLIC PANEL - Abnormal; Notable for the following:    Glucose, Bld 156 (*)    GFR calc non Af Amer 71 (*)    GFR calc Af Amer 82 (*)    All other components within normal limits  PRO B NATRIURETIC PEPTIDE - Abnormal; Notable for the following:  Pro B Natriuretic peptide (BNP) 747.1 (*)    All other components within normal limits  PROTIME-INR - Abnormal; Notable for the following:    Prothrombin Time 21.8 (*)    INR 1.97 (*)    All other components within normal limits  POCT I-STAT TROPONIN I   Imaging Review Dg Chest 2 View  03/08/2013   CLINICAL DATA:  Atrial fibrillation and shortness of breath  EXAM: CHEST  2 VIEW  COMPARISON:  01/08/2013  FINDINGS: Mild cardiac enlargement stable. Mild calcification of the aortic arch. Vascular pattern normal. Lungs clear. No effusions.  IMPRESSION: Stable mild cardiac enlargement.   Electronically Signed   By: Skipper Cliche M.D.   On: 03/08/2013 11:42    EKG Interpretation    Date/Time:  Thursday March 08 2013 10:50:47 EST Ventricular Rate:  84 PR Interval:  178 QRS Duration: 74 QT Interval:  350 QTC Calculation: 413 R  Axis:   -51 Text Interpretation:  Normal sinus rhythm Left anterior fascicular block Cannot rule out Inferior infarct (masked by fascicular block?) , age undetermined Anterior infarct , age undetermined Abnormal ECG New Changes compared to old EKG Confirmed by Alissandra Geoffroy  MD, Eriverto Byrnes ([id number]) on 03/08/2013 2:19:12 PM            MDM   1. Atrial fibrillation    Patient with long-standing history of atrial fibrillation and is on Coumadin and is on diltiazem. Patient took her medication at home around 5 corrected problem no rapid atrial fib here currently and blood pressure is improved. Chest x-ray negative for HF or pulmonary edema or pneumonia.  Discussed with cardiology on call they will arrange for her to followup in the office. Patient improves significantly feels fine can be discharged home time of discharge heart rate is 71 blood pressure is 100/58.    Mervin Kung, MD 03/08/13 [phone]

## 2013-03-08 NOTE — Discharge Instructions (Signed)
Atrial Fibrillation Atrial fibrillation is a condition that causes your heart to beat irregularly. It may also cause your heart to beat faster than normal. Atrial fibrillation can prevent your heart from pumping blood normally. It increases your risk of stroke and heart problems. HOME CARE  Take medications as told by your doctor.  Only take medications that your doctor says are safe. Some medications can make the condition worse or happen again.  If blood thinners were prescribed by your doctor, take them exactly as told. Too much can cause bleeding. Too little and you will not have the needed protection against stroke and other problems.  Perform blood tests at home if told by your doctor.  Perform blood tests exactly as told by your doctor.  Do not drink alcohol.  Do not drink beverages with caffeine such as coffee, soda, and some teas.  Maintain a healthy weight.  Do not use diet pills unless your doctor says they are safe. They may make heart problems worse.  Follow diet instructions as told by your doctor.  Exercise regularly as told by your doctor.  Keep all follow-up appointments. GET HELP RIGHT AWAY IF:   You have chest or belly (abdominal) pain.  You feel sick to your stomach (nauseous)  You suddenly have swollen feet and ankles.  You feel dizzy.  You face, arms, or legs feel numb or weak.  There is a change in your vision or speech.  You notice a change in the speed, rhythm, or strength of your heartbeat.  You suddenly begin peeing (urinating) more often.  You get tired more easily when moving or exercising. MAKE SURE YOU:   Understand these instructions.  Will watch your condition.  Will get help right away if you are not doing well or get worse. Document Released: 11/18/2007 Document Revised: 06/05/2012 Document Reviewed: 03/21/2012 [facility] Patient Information 2014 [place].  Cardiology will be getting a call for followup or you can call  them. Return for any worsening shortness of breath blood pressure problems or rapid heart rate lasting more than 40 minutes.

## 2013-03-08 NOTE — ED Notes (Signed)
Pt reports hx of afib, felt like HR was racing since midnight. Reports mild cp and sob, HR was 130-140 pta. Took her regular meds this am 0500. ekg done at triage, no resp distress noted, ekg HR 84.

## 2013-03-08 NOTE — ED Notes (Signed)
Patient said she started having chest pain last night about midnight.  She also advised me that she did some strenuous things yesterday.  She helped her 85yr old sister carry groceries up some stairs and she cleaned her kitchen as well.  She is not having chest at this time and she denies taking anything at home.  She refuses to take Nitro because she dropped her pressure so low it made her feel really sick.  She is on coumadin for her Afib.

## 2013-03-08 NOTE — ED Notes (Signed)
Family at bedside. 

## 2013-03-08 NOTE — ED Notes (Signed)
Patient transported to X-ray 

## 2013-03-08 NOTE — Telephone Encounter (Signed)
New message  Patient is having trouble with getting her pulse down 150

## 2013-03-08 NOTE — Telephone Encounter (Signed)
Patient states she began AFib episode last night around MN.  She is symptomatic with increased SOB to the point of using her inhaler, HR 150 lowest rate, BP 112/65 after "extra" dose of Diltiazem, pain and increased pressure to head/ears/arms. Statues she is having difficulty moving around due to the AFib effects.  Patient states she is hopeful that she can be treated in office because "she does not want to have to be in the ED for hours hooked up to a monitor while waiting for her heart rate to return to normal". Patient also states she has an appointment this afternoon for a pre-op visit with her Orthopedic Surgeon. Advised patient that she needs to have someone drive her to ED as soon as possible. Provided education regarding effects of symptomatic AFib and the risk of delaying diagnosis/treatment. Patient verbalized understanding and agreement. She stated that she "totally understands" the importance of Emergent Care for acute symptomatic AFib.  Patient is on Coumadin and stated she is compliant with her medications. Advised patient that she definitely needs to call her Orthopedic Surgeon and make them aware of her current AFib status, as this may be important information related to status of her upcoming surgery. Education provided related to concerns of surgery complications during and immediately after Acute Symptomatic episode of AFib.  Patient stated she will contact Orthopedic Surgeon and proceed to ED. Her husband will drive her, as she was advised not to drive.

## 2013-03-08 NOTE — H&P (Signed)
[name] is an 73 y.o. female.    Chief Complaint: right shoulder pain  HPI: Pt is a 73 y.o. female complaining of right shoulder pain for multiple years. Pain had continually increased since the beginning. X-rays in the clinic show rotator cuff tear right shoulder. Pt has tried various conservative treatments which have failed to alleviate their symptoms, including therapy and injections. Various options are discussed with the patient. Risks, benefits and expectations were discussed with the patient. Patient understand the risks, benefits and expectations and wishes to proceed with surgery.   PCP:  Adella Hare, MD  D/C Plans:  Home   PMH: Past Medical History  Diagnosis Date  . Allergic rhinitis   . Asthma   . Gout     takes Colchicine prn  . Peptic ulcer disease   . History of PSVT (paroxysmal supraventricular tachycardia)   . HLD (hyperlipidemia)     pt doesn't take any medication for this  . DJD (degenerative joint disease)   . History of blood clots     right lung in early 90's  . Sleep apnea     doesn't use CPAP  . Shortness of breath     pt states that she can be sitting as well as exertion and get short of breath  . Bronchitis     in Jan 2013-uses Albuterol prn  . History of migraine headaches     last migraine 30yrs ago;Pt states she does get sinus headaches  . Fibromyalgia   . Osteoarthritis   . Chronic back pain     hx buldging disc  . Rash     on neck and started after she started taking her beta blocker 75yrs ago;medical MD has seen this  . GERD (gastroesophageal reflux disease)     uses Rolaids prn  . Gastric ulcer   . Hemorrhoids   . Family hx of colon cancer   . Diabetes mellitus     glimepiride  . Yeast infection of the vagina     being treated for this now by dr.fontaine  . Spinal headache     after 2nd child  . Bronchitis, acute 03-01-11    FINSHED Z-PAK-STILL ON PREDNISONE NOW  . Hypertension     takes Benazepril and Diltiazem daily  .  Atrial fibrillation     has been off of coumadin since 06/02/11 d/t surgery  . Peripheral edema     takes Furosemide every other day  . Dizziness     occasionally  . Constipation     takes Colace prn  . Colon cancer 2007    s/p surgery and chemo  . Urinary frequency   . Nocturia   . Anxiety     and panic attacks;pt states that she is claustrophobic    PSH: Past Surgical History  Procedure Laterality Date  . Caesarean section  1966/69/72    x 3  . Heel spur surgery  1992    x 2  . Ankle surgery      tumor-benign removed left ankle  . Lumbar disc surgery  2008  . Colectomy  2007    colon cancer  . Pyelonidal cystectomy      at age 73  . Knee arthrosocpy      bil;couple of years before knee replacement  . Ganglion cyst excision  > 84yrs ago    removed from left pointer finger  . Cataract surgery  2010  . Tubal ligation  1972  . Cardiac  catheterization  90's and 2005  . Port a cath placed  2008  . Port a cath removed  2008  . Knee surgery      left TKA  . Anterior cervical decomp/discectomy fusion  01/12/2011    Procedure: ANTERIOR CERVICAL DECOMPRESSION/DISCECTOMY FUSION 2 LEVELS;  Surgeon: Cooper Render Pool;  Location: [place] [facility];  Service: Neurosurgery;  Laterality: Bilateral;  Cervical five-six, six-seven anterior cervical discectomy and fusion with allograft and plating  . Hysteroscopy w/d&c  02/19/2011    Procedure: DILATATION AND CURETTAGE /HYSTEROSCOPY;  Surgeon: Anastasio Auerbach, MD;  Location: [place] [facility];  Service: Gynecology;  Laterality: N/A;  requests one hour  . Dilation and curettage of uterus  02-19-11    & POLYP REMOVAL  . Anterior cervical decomp/discectomy fusion  06/11/2011    Procedure: ANTERIOR CERVICAL DECOMPRESSION/DISCECTOMY FUSION 1 LEVEL/HARDWARE REMOVAL;  Surgeon: Charlie Pitter, MD;  Location: [place] [facility];  Service: Neurosurgery;  Laterality: N/A;  Cervical four-five anterior cervical decompression fusion with allograft and plating; Removal of Cervical  five to seven plate    Social History:  reports that she quit smoking about 21 years ago. Her smoking use included Cigarettes. She has a 45 pack-year smoking history. She has never used smokeless tobacco. She reports that she does not drink alcohol or use illicit drugs.  Allergies:  Allergies  Allergen Reactions  . Benazepril Other (See Comments)    coughing  . Adhesive [Tape] Other (See Comments)    redness  . Morphine Other (See Comments)       Makes you feel bad all over  . Nifedipine Hives and Itching  . Piroxicam Hives and Itching    Medications: No current facility-administered medications for this encounter.   Current Outpatient Prescriptions  Medication Sig Dispense Refill  . albuterol (PROVENTIL HFA;VENTOLIN HFA) 108 (90 BASE) MCG/ACT inhaler Inhale 2 puffs into the lungs every 6 (six) hours as needed for wheezing or shortness of breath.      . ALPRAZolam (XANAX) 0.5 MG tablet Take 1 tablet (0.5 mg total) by mouth 3 (three) times daily as needed for sleep or anxiety. Take 1 tab every 6hrs prn  50 tablet  1  . colchicine 0.6 MG tablet Take 0.6 mg by mouth daily as needed. gout      . fluticasone (FLONASE) 50 MCG/ACT nasal spray Place 2 sprays into both nostrils daily.  16 g  6  . furosemide (LASIX) 40 MG tablet Take 40 mg by mouth daily.      [name] glimepiride (AMARYL) 4 MG tablet Take 4 mg by mouth daily with breakfast.      . loperamide (IMODIUM) 2 MG capsule Take 2 mg by mouth 4 (four) times daily as needed. Loose stools      . oxyCODONE-acetaminophen (PERCOCET/ROXICET) 5-325 MG per tablet Take 1 tablet by mouth every 4 (four) hours as needed. pain  60 tablet  0  . promethazine (PHENERGAN) 25 MG tablet Take 1 tablet (25 mg total) by mouth every 6 (six) hours as needed. Only takes for severe nausea  60 tablet  3  . telmisartan (MICARDIS) 40 MG tablet Take 40 mg by mouth daily.      [name] warfarin (COUMADIN) 5 MG tablet Take 2.5-5 mg by mouth daily. Take 5mg  on Sunday and Thursday.  Take 2.5mg  the rest of the week      . diltiazem (TIAZAC) 240 MG 24 hr capsule Take 1 capsule (240 mg total) by mouth daily.  30 capsule  6  . glimepiride (AMARYL) 4 MG tablet TAKE 1 TABLET BY MOUTH ONCE A DAY  30 tablet  4  . ONE TOUCH ULTRA TEST test strip TEST ONCE DAILY  100 each  3  . [DISCONTINUED] digoxin (LANOXIN) 0.125 MG tablet Take 125 mcg by mouth daily.        No results found for this or any previous visit (from the past 48 hour(s)). No results found.  ROS: Pain with rom of the right upper extremity  Physical Exam:  Alert and oriented 73 y.o. female in no acute distress Cranial nerves 2-12 intact Cervical spine: full rom with no tenderness, nv intact distally Chest: active breath sounds bilaterally, no wheeze rhonchi or rales Heart: regular rate and rhythm, no murmur Abd: non tender non distended with active bowel sounds Hip is stable with rom  Right upper extremity with mild limitation with rom and weakness due to known rotator cuff tear nv intact distally No rashes or edema  Assessment/Plan Assessment: right shoulder pain and weakness due to rotator cuff tear  Plan: Patient will undergo a right shoulder rotator cuff repair by Dr. Veverly Fells at [facility]. Risks benefits and expectations were discussed with the patient. Patient understand risks, benefits and expectations and wishes to proceed.

## 2013-03-14 ENCOUNTER — Encounter (HOSPITAL_COMMUNITY): Payer: Self-pay

## 2013-03-14 ENCOUNTER — Encounter (HOSPITAL_COMMUNITY)
Admit: 2013-03-14 | Discharge: 2013-03-14 | Disposition: A | Discharge status: Home / Self Care | Payer: Medicare Other | Attending: Internal Medicine | Admitting: Internal Medicine

## 2013-03-14 DIAGNOSIS — M719 Bursopathy, unspecified: Secondary | ICD-10-CM | POA: Diagnosis not present

## 2013-03-14 DIAGNOSIS — J309 Allergic rhinitis, unspecified: Secondary | ICD-10-CM | POA: Diagnosis not present

## 2013-03-14 DIAGNOSIS — Z7901 Long term (current) use of anticoagulants: Secondary | ICD-10-CM | POA: Diagnosis not present

## 2013-03-14 DIAGNOSIS — Z8711 Personal history of peptic ulcer disease: Secondary | ICD-10-CM | POA: Diagnosis not present

## 2013-03-14 DIAGNOSIS — K219 Gastro-esophageal reflux disease without esophagitis: Secondary | ICD-10-CM | POA: Diagnosis not present

## 2013-03-14 DIAGNOSIS — IMO0001 Reserved for inherently not codable concepts without codable children: Secondary | ICD-10-CM | POA: Diagnosis not present

## 2013-03-14 DIAGNOSIS — I10 Essential (primary) hypertension: Secondary | ICD-10-CM | POA: Diagnosis not present

## 2013-03-14 DIAGNOSIS — I4891 Unspecified atrial fibrillation: Secondary | ICD-10-CM | POA: Diagnosis not present

## 2013-03-14 DIAGNOSIS — E119 Type 2 diabetes mellitus without complications: Secondary | ICD-10-CM | POA: Diagnosis not present

## 2013-03-14 DIAGNOSIS — Z86711 Personal history of pulmonary embolism: Secondary | ICD-10-CM | POA: Diagnosis not present

## 2013-03-14 DIAGNOSIS — J45909 Unspecified asthma, uncomplicated: Secondary | ICD-10-CM | POA: Diagnosis not present

## 2013-03-14 DIAGNOSIS — M19019 Primary osteoarthritis, unspecified shoulder: Secondary | ICD-10-CM | POA: Diagnosis not present

## 2013-03-14 DIAGNOSIS — R609 Edema, unspecified: Secondary | ICD-10-CM | POA: Diagnosis not present

## 2013-03-14 DIAGNOSIS — Z85038 Personal history of other malignant neoplasm of large intestine: Secondary | ICD-10-CM | POA: Diagnosis not present

## 2013-03-14 DIAGNOSIS — M549 Dorsalgia, unspecified: Secondary | ICD-10-CM | POA: Diagnosis not present

## 2013-03-14 DIAGNOSIS — B373 Candidiasis of vulva and vagina: Secondary | ICD-10-CM | POA: Diagnosis not present

## 2013-03-14 DIAGNOSIS — M109 Gout, unspecified: Secondary | ICD-10-CM | POA: Diagnosis not present

## 2013-03-14 DIAGNOSIS — R350 Frequency of micturition: Secondary | ICD-10-CM | POA: Diagnosis not present

## 2013-03-14 DIAGNOSIS — G8929 Other chronic pain: Secondary | ICD-10-CM | POA: Diagnosis not present

## 2013-03-14 DIAGNOSIS — F410 Panic disorder [episodic paroxysmal anxiety] without agoraphobia: Secondary | ICD-10-CM | POA: Diagnosis not present

## 2013-03-14 DIAGNOSIS — Z9221 Personal history of antineoplastic chemotherapy: Secondary | ICD-10-CM | POA: Diagnosis not present

## 2013-03-14 DIAGNOSIS — B3731 Acute candidiasis of vulva and vagina: Secondary | ICD-10-CM | POA: Diagnosis not present

## 2013-03-14 DIAGNOSIS — G4730 Sleep apnea, unspecified: Secondary | ICD-10-CM | POA: Diagnosis not present

## 2013-03-14 DIAGNOSIS — M67919 Unspecified disorder of synovium and tendon, unspecified shoulder: Secondary | ICD-10-CM | POA: Diagnosis not present

## 2013-03-14 DIAGNOSIS — Z87891 Personal history of nicotine dependence: Secondary | ICD-10-CM | POA: Diagnosis not present

## 2013-03-14 DIAGNOSIS — M24119 Other articular cartilage disorders, unspecified shoulder: Secondary | ICD-10-CM | POA: Diagnosis not present

## 2013-03-14 DIAGNOSIS — E785 Hyperlipidemia, unspecified: Secondary | ICD-10-CM | POA: Diagnosis not present

## 2013-03-14 HISTORY — DX: Family history of other specified conditions: Z84.89

## 2013-03-14 HISTORY — DX: Personal history of colon polyps, unspecified: Z86.0100

## 2013-03-14 HISTORY — DX: Anesthesia of skin: R20.0

## 2013-03-14 HISTORY — DX: Personal history of other infectious and parasitic diseases: Z86.19

## 2013-03-14 HISTORY — DX: Personal history of colonic polyps: Z86.010

## 2013-03-14 MED ORDER — CHLORHEXIDINE GLUCONATE 4 % EX LIQD: 60.0000 mL | Freq: Once | CUTANEOUS | Status: DC

## 2013-03-14 NOTE — Progress Notes (Signed)
Dr.Paula Harrington Challenger is cardiologist with last visit in epic  Echo reports in epic from 2005 and 2012  Stress test report in epic from 2013  Heart cath report in epic from 2005   Sleep study in epic from 2011 but doesn't require a cpap  Dr.Michael Norins is Medical MD

## 2013-03-14 NOTE — Pre-Procedure Instructions (Signed)
RUIE SENDEJO  03/14/2013   Your procedure is scheduled on:  Fri, Jan 23 @ 7:30 AM  Report to [facility] A at 5:30 AM.  Call this number if you have problems the morning of surgery: [phone]   Remember:   Do not eat food or drink liquids after midnight.   Take these medicines the morning of surgery with A SIP OF WATER: Albuterol<Bring Your Inhaler With You>,Alprazolam(Xanax),Colchicine(if needed),Diltiazem(Tiazac),Flonase(Fluticasone),Pain Pill(if needed),Phenergan(Promethazine),and Micardis(Telmisartan)               Stop Coumadin as instructed. No Goody's,BC's,Aleve,Aspirin,Ibuprofen,Fish Oil,or any Herbal Medications   Do not wear jewelry, make-up or nail polish.  Do not wear lotions, powders, or perfumes. You may wear deodorant.  Do not shave 48 hours prior to surgery.   Do not bring valuables to the hospital.  [facility] is not responsible                  for any belongings or valuables.               Contacts, dentures or bridgework may not be worn into surgery.  Leave suitcase in the car. After surgery it may be brought to your room.  For patients admitted to the hospital, discharge time is determined by your                treatment team.               Patients discharged the day of surgery will not be allowed to drive  home.    Special Instructions: Shower using CHG 2 nights before surgery and the night before surgery.  If you shower the day of surgery use CHG.  Use special wash - you have one bottle of CHG for all showers.  You should use approximately 1/3 of the bottle for each shower.   Please read over the following fact sheets that you were given: Pain Booklet, Coughing and Deep Breathing and Surgical Site Infection Prevention

## 2013-03-15 ENCOUNTER — Ambulatory Visit (INDEPENDENT_AMBULATORY_CARE_PROVIDER_SITE_OTHER): Payer: Medicare Other

## 2013-03-15 DIAGNOSIS — Z7901 Long term (current) use of anticoagulants: Secondary | ICD-10-CM | POA: Diagnosis not present

## 2013-03-15 DIAGNOSIS — I4891 Unspecified atrial fibrillation: Secondary | ICD-10-CM

## 2013-03-15 DIAGNOSIS — Z5181 Encounter for therapeutic drug level monitoring: Secondary | ICD-10-CM

## 2013-03-15 DIAGNOSIS — Z0000 Encounter for general adult medical examination without abnormal findings: Secondary | ICD-10-CM | POA: Insufficient documentation

## 2013-03-15 LAB — POCT INR: INR: 0.9

## 2013-03-15 MED ORDER — CHLORHEXIDINE GLUCONATE CLOTH 2 % EX PADS: 6.0000 | MEDICATED_PAD | Freq: Once | CUTANEOUS | Status: DC

## 2013-03-15 MED ORDER — CEFAZOLIN SODIUM-DEXTROSE 2-3 GM-% IV SOLR
2.0000 g | INTRAVENOUS | Status: AC
  Administered 2013-03-16: 2 g via INTRAVENOUS
  Filled 2013-03-15: qty 50

## 2013-03-15 NOTE — Progress Notes (Signed)
Anesthesia Chart Review: Patient is a 73 year old female scheduled for right shoulder arthroscopy, rotator cuff repair on 03/16/13 by Dr. Veverly Fells.  History includes DM2, former smoker, history of spinal headache, asthma, PSVT/afib, HLD, migraines, PUD, HTN, edema, colon CA s/p colectomy, anxiety, fibromyalgia, GERD, OSA, morbid obesity, C4-5 ACDF 06/11/11. PCP is Dr. Linda Hedges. Her primary Cardiologist is now Dr. Dorris Carnes (formerly with Dr. Lia Foyer), last visit 12/15/114.  Of note, she was told to present to the ED on 03/08/13 when she called her cardiologist's office to report HR 150 (presumed afib/PSVT) with increased SOB.  She took an extra Cardizem and had already converted to NSR with rate in the 70's by the time the EKG was done in the ED.  Her symptoms improved then. The ED did notify cardiology and she was discharged home.    Her EKG on 03/08/13 showed NSR, LAFB, cannot rule out inferior infarct (masked by fascicular block?), age undetermined, anterior infarct, age undetermined.  R wave is lower in lead II when compared to last tracing on 02/05/13.   She had a stress test on 05/14/11 that showed: Normal stress nuclear study. LV Ejection Fraction: 70%. LV Wall Motion: NL LV Function; NL Wall Motion.   Echo on 09/16/10 showed:  - Left ventricle: The cavity size was normal. Wall thickness was increased in a pattern of mild LVH. Systolic function was normal. The estimated ejection fraction was in the  ange of 55% to 60%.There was an increased relative contribution of atrial contraction to ventricular filling. - Aortic valve: Mild regurgitation.  Last cath was on 08/19/03 and showed normal coronaries.   CXR on 03/08/13 showed stable mild cardiac enlargement.  Labs on 03/08/13 noted.  INR on 03/15/13 at [facility] was 0.9.  PTT not done, so will plan to do on arrival (unless deferred per her anesthesiologist).    Cardiac history reviewed with anesthesiologist Dr. Glennon Mac.  Patient with presumed brief episode of  rapid afib that responded to extra oral dose of Cardizem.  HR in the 70's yesterday.  She has already been told to take her diltiazem in the morning.  If no recurrent arrhythmia or new CV/CHF symptoms then it is anticipated that she can proceed as planned.  George Hugh [facility]/Anesthesiology Phone [phone] 03/15/2013 11:12 AM

## 2013-03-16 ENCOUNTER — Ambulatory Visit (HOSPITAL_COMMUNITY): Payer: Medicare Other | Admitting: Anesthesiology

## 2013-03-16 ENCOUNTER — Encounter (HOSPITAL_COMMUNITY): Payer: Self-pay | Admitting: Surgery

## 2013-03-16 ENCOUNTER — Encounter (HOSPITAL_COMMUNITY)
Admission: RE | Disposition: A | Discharge status: Home / Self Care | Payer: Self-pay | Source: Ambulatory Visit | Attending: Orthopedic Surgery

## 2013-03-16 ENCOUNTER — Encounter (HOSPITAL_COMMUNITY): Payer: Medicare Other | Admitting: Vascular Surgery

## 2013-03-16 ENCOUNTER — Ambulatory Visit (HOSPITAL_COMMUNITY)
Admission: RE | Admit: 2013-03-16 | Discharge: 2013-03-17 | Disposition: A | Discharge status: Home / Self Care | Payer: Medicare Other | Source: Ambulatory Visit | Attending: Orthopedic Surgery | Admitting: Orthopedic Surgery

## 2013-03-16 DIAGNOSIS — S43429A Sprain of unspecified rotator cuff capsule, initial encounter: Secondary | ICD-10-CM | POA: Diagnosis not present

## 2013-03-16 DIAGNOSIS — M67919 Unspecified disorder of synovium and tendon, unspecified shoulder: Secondary | ICD-10-CM | POA: Insufficient documentation

## 2013-03-16 DIAGNOSIS — M719 Bursopathy, unspecified: Secondary | ICD-10-CM | POA: Diagnosis not present

## 2013-03-16 DIAGNOSIS — M75101 Unspecified rotator cuff tear or rupture of right shoulder, not specified as traumatic: Secondary | ICD-10-CM | POA: Diagnosis present

## 2013-03-16 DIAGNOSIS — I10 Essential (primary) hypertension: Secondary | ICD-10-CM | POA: Insufficient documentation

## 2013-03-16 DIAGNOSIS — R350 Frequency of micturition: Secondary | ICD-10-CM | POA: Insufficient documentation

## 2013-03-16 DIAGNOSIS — J45909 Unspecified asthma, uncomplicated: Secondary | ICD-10-CM | POA: Insufficient documentation

## 2013-03-16 DIAGNOSIS — Z87891 Personal history of nicotine dependence: Secondary | ICD-10-CM | POA: Insufficient documentation

## 2013-03-16 DIAGNOSIS — F410 Panic disorder [episodic paroxysmal anxiety] without agoraphobia: Secondary | ICD-10-CM | POA: Insufficient documentation

## 2013-03-16 DIAGNOSIS — I4891 Unspecified atrial fibrillation: Secondary | ICD-10-CM | POA: Insufficient documentation

## 2013-03-16 DIAGNOSIS — M25819 Other specified joint disorders, unspecified shoulder: Secondary | ICD-10-CM | POA: Diagnosis not present

## 2013-03-16 DIAGNOSIS — Z85038 Personal history of other malignant neoplasm of large intestine: Secondary | ICD-10-CM | POA: Insufficient documentation

## 2013-03-16 DIAGNOSIS — M24119 Other articular cartilage disorders, unspecified shoulder: Secondary | ICD-10-CM | POA: Insufficient documentation

## 2013-03-16 DIAGNOSIS — B3731 Acute candidiasis of vulva and vagina: Secondary | ICD-10-CM | POA: Insufficient documentation

## 2013-03-16 DIAGNOSIS — Z86711 Personal history of pulmonary embolism: Secondary | ICD-10-CM | POA: Insufficient documentation

## 2013-03-16 DIAGNOSIS — Z7901 Long term (current) use of anticoagulants: Secondary | ICD-10-CM | POA: Insufficient documentation

## 2013-03-16 DIAGNOSIS — G8929 Other chronic pain: Secondary | ICD-10-CM | POA: Insufficient documentation

## 2013-03-16 DIAGNOSIS — G4730 Sleep apnea, unspecified: Secondary | ICD-10-CM | POA: Insufficient documentation

## 2013-03-16 DIAGNOSIS — E119 Type 2 diabetes mellitus without complications: Secondary | ICD-10-CM | POA: Insufficient documentation

## 2013-03-16 DIAGNOSIS — E785 Hyperlipidemia, unspecified: Secondary | ICD-10-CM | POA: Insufficient documentation

## 2013-03-16 DIAGNOSIS — Z8711 Personal history of peptic ulcer disease: Secondary | ICD-10-CM | POA: Insufficient documentation

## 2013-03-16 DIAGNOSIS — J309 Allergic rhinitis, unspecified: Secondary | ICD-10-CM | POA: Diagnosis not present

## 2013-03-16 DIAGNOSIS — B373 Candidiasis of vulva and vagina: Secondary | ICD-10-CM | POA: Insufficient documentation

## 2013-03-16 DIAGNOSIS — M549 Dorsalgia, unspecified: Secondary | ICD-10-CM | POA: Insufficient documentation

## 2013-03-16 DIAGNOSIS — K219 Gastro-esophageal reflux disease without esophagitis: Secondary | ICD-10-CM | POA: Insufficient documentation

## 2013-03-16 DIAGNOSIS — S43439A Superior glenoid labrum lesion of unspecified shoulder, initial encounter: Secondary | ICD-10-CM | POA: Diagnosis not present

## 2013-03-16 DIAGNOSIS — M19019 Primary osteoarthritis, unspecified shoulder: Secondary | ICD-10-CM | POA: Insufficient documentation

## 2013-03-16 DIAGNOSIS — M109 Gout, unspecified: Secondary | ICD-10-CM | POA: Insufficient documentation

## 2013-03-16 DIAGNOSIS — IMO0001 Reserved for inherently not codable concepts without codable children: Secondary | ICD-10-CM | POA: Insufficient documentation

## 2013-03-16 DIAGNOSIS — R609 Edema, unspecified: Secondary | ICD-10-CM | POA: Insufficient documentation

## 2013-03-16 DIAGNOSIS — Z9221 Personal history of antineoplastic chemotherapy: Secondary | ICD-10-CM | POA: Insufficient documentation

## 2013-03-16 HISTORY — PX: SHOULDER ARTHROSCOPY WITH SUBACROMIAL DECOMPRESSION AND OPEN ROTATOR C: SHX5688

## 2013-03-16 LAB — CBC
HEMATOCRIT: 38.5 % (ref 36.0–46.0)
Hemoglobin: 12.9 g/dL (ref 12.0–15.0)
MCH: 29.2 pg (ref 26.0–34.0)
MCHC: 33.5 g/dL (ref 30.0–36.0)
MCV: 87.1 fL (ref 78.0–100.0)
Platelets: 219 10*3/uL (ref 150–400)
RBC: 4.42 MIL/uL (ref 3.87–5.11)
RDW: 14.6 % (ref 11.5–15.5)
WBC: 11.6 10*3/uL — AB (ref 4.0–10.5)

## 2013-03-16 LAB — CREATININE, SERUM
Creatinine, Ser: 0.77 mg/dL (ref 0.50–1.10)
GFR calc non Af Amer: 83 mL/min — ABNORMAL LOW (ref >90)

## 2013-03-16 LAB — GLUCOSE, CAPILLARY
GLUCOSE-CAPILLARY: 194 mg/dL — AB (ref 70–99)
GLUCOSE-CAPILLARY: 162 mg/dL — AB (ref 70–99)
Glucose-Capillary: 128 mg/dL — ABNORMAL HIGH (ref 70–99)
Glucose-Capillary: 180 mg/dL — ABNORMAL HIGH (ref 70–99)

## 2013-03-16 LAB — APTT: APTT: 30 s (ref 24–37)

## 2013-03-16 SURGERY — SHOULDER ARTHROSCOPY WITH SUBACROMIAL DECOMPRESSION AND OPEN ROTATOR CUFF REPAIR, OPEN BICEPS TENDON REPAIR
Anesthesia: General | Site: Shoulder | Laterality: Right

## 2013-03-16 MED ORDER — ONDANSETRON HCL 4 MG/2ML IJ SOLN: 4.0000 mg | Freq: Once | INTRAMUSCULAR | Status: DC | PRN

## 2013-03-16 MED ORDER — HYDROMORPHONE HCL PF 1 MG/ML IJ SOLN: 0.2500 mg | INTRAMUSCULAR | Status: DC | PRN

## 2013-03-16 MED ORDER — NEOSTIGMINE METHYLSULFATE 1 MG/ML IJ SOLN
INTRAMUSCULAR | Status: DC | PRN
  Administered 2013-03-16: 4 mg via INTRAVENOUS

## 2013-03-16 MED ORDER — LIDOCAINE HCL (CARDIAC) 20 MG/ML IV SOLN
INTRAVENOUS | Status: DC | PRN
  Administered 2013-03-16: 80 mg via INTRAVENOUS

## 2013-03-16 MED ORDER — ONDANSETRON HCL 4 MG/2ML IJ SOLN
4.0000 mg | Freq: Four times a day (QID) | INTRAMUSCULAR | Status: DC | PRN
Start: 2013-03-16 — End: 2013-03-17
  Administered 2013-03-17: 4 mg via INTRAVENOUS
  Filled 2013-03-16: qty 2

## 2013-03-16 MED ORDER — FENTANYL CITRATE 0.05 MG/ML IJ SOLN
INTRAMUSCULAR | Status: DC | PRN
  Administered 2013-03-16 (×2): 50 ug via INTRAVENOUS
  Administered 2013-03-16: 100 ug via INTRAVENOUS
  Administered 2013-03-16: 50 ug via INTRAVENOUS

## 2013-03-16 MED ORDER — ONDANSETRON HCL 4 MG PO TABS: 4.0000 mg | ORAL_TABLET | Freq: Four times a day (QID) | ORAL | Status: DC | PRN

## 2013-03-16 MED ORDER — DOCUSATE SODIUM 100 MG PO CAPS
100.0000 mg | ORAL_CAPSULE | Freq: Two times a day (BID) | ORAL | Status: DC
  Administered 2013-03-16 – 2013-03-17 (×2): 100 mg via ORAL
  Filled 2013-03-16 (×3): qty 1

## 2013-03-16 MED ORDER — INSULIN ASPART 100 UNIT/ML ~~LOC~~ SOLN: 0.0000 [IU] | Freq: Every day | SUBCUTANEOUS | Status: DC

## 2013-03-16 MED ORDER — WARFARIN SODIUM 5 MG PO TABS: 5.0000 mg | ORAL_TABLET | ORAL | Status: DC

## 2013-03-16 MED ORDER — ONDANSETRON HCL 4 MG/2ML IJ SOLN
INTRAMUSCULAR | Status: DC | PRN
  Administered 2013-03-16: 4 mg via INTRAVENOUS

## 2013-03-16 MED ORDER — NEOSTIGMINE METHYLSULFATE 1 MG/ML IJ SOLN
INTRAMUSCULAR | Status: AC
  Filled 2013-03-16: qty 10

## 2013-03-16 MED ORDER — ALPRAZOLAM 0.5 MG PO TABS
0.5000 mg | ORAL_TABLET | Freq: Three times a day (TID) | ORAL | Status: DC | PRN
  Administered 2013-03-17: 0.5 mg via ORAL
  Filled 2013-03-16: qty 1

## 2013-03-16 MED ORDER — WARFARIN SODIUM 2.5 MG PO TABS: 2.5000 mg | ORAL_TABLET | Freq: Every day | ORAL | Status: DC

## 2013-03-16 MED ORDER — INSULIN ASPART 100 UNIT/ML ~~LOC~~ SOLN
0.0000 [IU] | Freq: Three times a day (TID) | SUBCUTANEOUS | Status: DC
  Administered 2013-03-16 – 2013-03-17 (×2): 4 [IU] via SUBCUTANEOUS

## 2013-03-16 MED ORDER — EPHEDRINE SULFATE 50 MG/ML IJ SOLN
INTRAMUSCULAR | Status: DC | PRN
  Administered 2013-03-16: 15 mg via INTRAVENOUS
  Administered 2013-03-16: 10 mg via INTRAVENOUS
  Administered 2013-03-16: 5 mg via INTRAVENOUS

## 2013-03-16 MED ORDER — MIDAZOLAM HCL 2 MG/2ML IJ SOLN
INTRAMUSCULAR | Status: AC
  Filled 2013-03-16: qty 2

## 2013-03-16 MED ORDER — GLYCOPYRROLATE 0.2 MG/ML IJ SOLN
INTRAMUSCULAR | Status: DC | PRN
  Administered 2013-03-16: 0.6 mg via INTRAVENOUS

## 2013-03-16 MED ORDER — ROCURONIUM BROMIDE 50 MG/5ML IV SOLN
INTRAVENOUS | Status: AC
  Filled 2013-03-16: qty 1

## 2013-03-16 MED ORDER — HYDROMORPHONE HCL PF 1 MG/ML IJ SOLN
0.5000 mg | INTRAMUSCULAR | Status: DC | PRN
  Administered 2013-03-17: 1 mg via INTRAVENOUS
  Filled 2013-03-16: qty 1

## 2013-03-16 MED ORDER — ALBUTEROL SULFATE HFA 108 (90 BASE) MCG/ACT IN AERS
2.0000 | INHALATION_SPRAY | Freq: Four times a day (QID) | RESPIRATORY_TRACT | Status: DC | PRN
Start: 2013-03-16 — End: 2013-03-16

## 2013-03-16 MED ORDER — GLIMEPIRIDE 4 MG PO TABS
4.0000 mg | ORAL_TABLET | Freq: Every day | ORAL | Status: DC
  Administered 2013-03-17: 4 mg via ORAL
  Filled 2013-03-16 (×2): qty 1

## 2013-03-16 MED ORDER — FENTANYL CITRATE 0.05 MG/ML IJ SOLN
INTRAMUSCULAR | Status: AC
  Filled 2013-03-16: qty 5

## 2013-03-16 MED ORDER — PROMETHAZINE HCL 25 MG PO TABS: 25.0000 mg | ORAL_TABLET | Freq: Four times a day (QID) | ORAL | Status: DC | PRN

## 2013-03-16 MED ORDER — ENOXAPARIN SODIUM 40 MG/0.4ML ~~LOC~~ SOLN
40.0000 mg | SUBCUTANEOUS | Status: DC
  Administered 2013-03-16: 40 mg via SUBCUTANEOUS
  Filled 2013-03-16 (×2): qty 0.4

## 2013-03-16 MED ORDER — METHOCARBAMOL 100 MG/ML IJ SOLN: 500.0000 mg | Freq: Four times a day (QID) | INTRAMUSCULAR | Status: DC | PRN

## 2013-03-16 MED ORDER — COLCHICINE 0.6 MG PO TABS: 0.6000 mg | ORAL_TABLET | Freq: Every day | ORAL | Status: DC | PRN

## 2013-03-16 MED ORDER — BUPIVACAINE-EPINEPHRINE 0.25% -1:200000 IJ SOLN
INTRAMUSCULAR | Status: DC | PRN
  Administered 2013-03-16: 20 mL

## 2013-03-16 MED ORDER — WARFARIN - PHYSICIAN DOSING INPATIENT: Freq: Every day | Status: DC

## 2013-03-16 MED ORDER — PROPOFOL 10 MG/ML IV BOLUS
INTRAVENOUS | Status: AC
  Filled 2013-03-16: qty 20

## 2013-03-16 MED ORDER — OXYCODONE HCL 5 MG/5ML PO SOLN: 5.0000 mg | Freq: Once | ORAL | Status: AC | PRN

## 2013-03-16 MED ORDER — PROPOFOL 10 MG/ML IV BOLUS
INTRAVENOUS | Status: DC | PRN
  Administered 2013-03-16: 160 mg via INTRAVENOUS

## 2013-03-16 MED ORDER — BISACODYL 10 MG RE SUPP: 10.0000 mg | Freq: Every day | RECTAL | Status: DC | PRN

## 2013-03-16 MED ORDER — SODIUM CHLORIDE 0.9 % IJ SOLN
INTRAMUSCULAR | Status: AC
  Filled 2013-03-16: qty 10

## 2013-03-16 MED ORDER — OXYCODONE HCL 5 MG PO TABS
ORAL_TABLET | ORAL | Status: AC
Start: 2013-03-16 — End: 2013-03-17
  Filled 2013-03-16: qty 1

## 2013-03-16 MED ORDER — METHOCARBAMOL 500 MG PO TABS
500.0000 mg | ORAL_TABLET | Freq: Four times a day (QID) | ORAL | Status: DC | PRN
  Administered 2013-03-16 – 2013-03-17 (×4): 500 mg via ORAL
  Filled 2013-03-16 (×3): qty 1

## 2013-03-16 MED ORDER — SODIUM CHLORIDE 0.9 % IV SOLN: INTRAVENOUS | Status: DC

## 2013-03-16 MED ORDER — METOCLOPRAMIDE HCL 5 MG PO TABS
5.0000 mg | ORAL_TABLET | Freq: Three times a day (TID) | ORAL | Status: DC | PRN
  Filled 2013-03-16: qty 2

## 2013-03-16 MED ORDER — 0.9 % SODIUM CHLORIDE (POUR BTL) OPTIME
TOPICAL | Status: DC | PRN
  Administered 2013-03-16: 1000 mL

## 2013-03-16 MED ORDER — HYDROMORPHONE HCL PF 1 MG/ML IJ SOLN
INTRAMUSCULAR | Status: AC
  Filled 2013-03-16: qty 1

## 2013-03-16 MED ORDER — EPHEDRINE SULFATE 50 MG/ML IJ SOLN
INTRAMUSCULAR | Status: AC
  Filled 2013-03-16: qty 1

## 2013-03-16 MED ORDER — IRBESARTAN 150 MG PO TABS
150.0000 mg | ORAL_TABLET | Freq: Every day | ORAL | Status: DC
  Administered 2013-03-17: 150 mg via ORAL
  Filled 2013-03-16: qty 1

## 2013-03-16 MED ORDER — ACETAMINOPHEN 325 MG PO TABS: 650.0000 mg | ORAL_TABLET | Freq: Four times a day (QID) | ORAL | Status: DC | PRN

## 2013-03-16 MED ORDER — OXYCODONE-ACETAMINOPHEN 5-325 MG PO TABS
1.0000 | ORAL_TABLET | ORAL | Status: DC | PRN
  Administered 2013-03-16 (×4): 1 via ORAL
  Administered 2013-03-17 (×2): 2 via ORAL
  Filled 2013-03-16 (×3): qty 1
  Filled 2013-03-16: qty 2
  Filled 2013-03-16 (×2): qty 1
  Filled 2013-03-16: qty 2

## 2013-03-16 MED ORDER — LIDOCAINE HCL (CARDIAC) 20 MG/ML IV SOLN
INTRAVENOUS | Status: AC
  Filled 2013-03-16: qty 5

## 2013-03-16 MED ORDER — MENTHOL 3 MG MT LOZG: 1.0000 | LOZENGE | OROMUCOSAL | Status: DC | PRN

## 2013-03-16 MED ORDER — METOCLOPRAMIDE HCL 5 MG/ML IJ SOLN: 5.0000 mg | Freq: Three times a day (TID) | INTRAMUSCULAR | Status: DC | PRN

## 2013-03-16 MED ORDER — BUPIVACAINE-EPINEPHRINE (PF) 0.25% -1:200000 IJ SOLN
INTRAMUSCULAR | Status: AC
  Filled 2013-03-16: qty 30

## 2013-03-16 MED ORDER — FLUTICASONE PROPIONATE 50 MCG/ACT NA SUSP
2.0000 | Freq: Every day | NASAL | Status: DC
  Administered 2013-03-17: 2 via NASAL
  Filled 2013-03-16: qty 16

## 2013-03-16 MED ORDER — ALBUTEROL SULFATE (2.5 MG/3ML) 0.083% IN NEBU
2.5000 mg | INHALATION_SOLUTION | Freq: Four times a day (QID) | RESPIRATORY_TRACT | Status: DC | PRN
  Administered 2013-03-16: 2.5 mg via RESPIRATORY_TRACT
  Filled 2013-03-16: qty 3

## 2013-03-16 MED ORDER — LOPERAMIDE HCL 2 MG PO CAPS
2.0000 mg | ORAL_CAPSULE | Freq: Four times a day (QID) | ORAL | Status: DC | PRN
  Filled 2013-03-16: qty 1

## 2013-03-16 MED ORDER — FUROSEMIDE 40 MG PO TABS
40.0000 mg | ORAL_TABLET | Freq: Every day | ORAL | Status: DC
  Filled 2013-03-16 (×2): qty 1

## 2013-03-16 MED ORDER — MIDAZOLAM HCL 5 MG/5ML IJ SOLN
INTRAMUSCULAR | Status: DC | PRN
  Administered 2013-03-16: 2 mg via INTRAVENOUS

## 2013-03-16 MED ORDER — GLYCOPYRROLATE 0.2 MG/ML IJ SOLN
INTRAMUSCULAR | Status: AC
  Filled 2013-03-16: qty 3

## 2013-03-16 MED ORDER — WARFARIN SODIUM 2.5 MG PO TABS
2.5000 mg | ORAL_TABLET | ORAL | Status: DC
  Administered 2013-03-16: 2.5 mg via ORAL
  Filled 2013-03-16 (×4): qty 1

## 2013-03-16 MED ORDER — DEXMEDETOMIDINE HCL IN NACL 200 MCG/50ML IV SOLN
0.4000 ug/kg/h | INTRAVENOUS | Status: AC
  Administered 2013-03-16: 0.5 ug/kg/h via INTRAVENOUS
  Filled 2013-03-16: qty 50

## 2013-03-16 MED ORDER — SODIUM CHLORIDE 0.9 % IR SOLN
Status: DC | PRN
  Administered 2013-03-16: 3000 mL
  Administered 2013-03-16: 1000 mL

## 2013-03-16 MED ORDER — SODIUM CHLORIDE 0.9 % IV SOLN
10.0000 mg | INTRAVENOUS | Status: DC | PRN
  Administered 2013-03-16: 15 ug/min via INTRAVENOUS

## 2013-03-16 MED ORDER — ONDANSETRON HCL 4 MG/2ML IJ SOLN
INTRAMUSCULAR | Status: AC
  Filled 2013-03-16: qty 2

## 2013-03-16 MED ORDER — METHOCARBAMOL 500 MG PO TABS
ORAL_TABLET | ORAL | Status: AC
  Filled 2013-03-16: qty 1

## 2013-03-16 MED ORDER — LACTATED RINGERS IV SOLN
INTRAVENOUS | Status: DC | PRN
  Administered 2013-03-16: 07:00:00 via INTRAVENOUS

## 2013-03-16 MED ORDER — INSULIN ASPART 100 UNIT/ML ~~LOC~~ SOLN
6.0000 [IU] | Freq: Three times a day (TID) | SUBCUTANEOUS | Status: DC
  Administered 2013-03-16 – 2013-03-17 (×2): 6 [IU] via SUBCUTANEOUS

## 2013-03-16 MED ORDER — OXYCODONE HCL 5 MG PO TABS
5.0000 mg | ORAL_TABLET | Freq: Once | ORAL | Status: AC | PRN
  Administered 2013-03-16: 5 mg via ORAL

## 2013-03-16 MED ORDER — DILTIAZEM HCL ER BEADS 240 MG PO CP24
240.0000 mg | ORAL_CAPSULE | Freq: Every day | ORAL | Status: DC
Start: 2013-03-17 — End: 2013-03-17
  Administered 2013-03-17: 240 mg via ORAL
  Filled 2013-03-16: qty 1

## 2013-03-16 MED ORDER — PHENOL 1.4 % MT LIQD: 1.0000 | OROMUCOSAL | Status: DC | PRN

## 2013-03-16 MED ORDER — ROCURONIUM BROMIDE 100 MG/10ML IV SOLN
INTRAVENOUS | Status: DC | PRN
  Administered 2013-03-16: 50 mg via INTRAVENOUS

## 2013-03-16 MED ORDER — HYDROMORPHONE HCL PF 1 MG/ML IJ SOLN
0.2500 mg | INTRAMUSCULAR | Status: DC | PRN
  Administered 2013-03-16: 0.5 mg via INTRAVENOUS

## 2013-03-16 MED ORDER — ACETAMINOPHEN 650 MG RE SUPP: 650.0000 mg | Freq: Four times a day (QID) | RECTAL | Status: DC | PRN

## 2013-03-16 MED ORDER — CEFAZOLIN SODIUM-DEXTROSE 2-3 GM-% IV SOLR
2.0000 g | Freq: Four times a day (QID) | INTRAVENOUS | Status: AC
  Administered 2013-03-16 – 2013-03-17 (×2): 2 g via INTRAVENOUS
  Filled 2013-03-16 (×2): qty 50

## 2013-03-16 SURGICAL SUPPLY — 63 items
ANCHOR ALL- SUT RC 2 SUT Y-K (Anchor) ×4 IMPLANT
ANCHOR ALL-SUT FLEX 1.3 Y-KNOT (Anchor) ×3 IMPLANT
ANCHOR ALL-SUT RC 2 SUT Y-K (Anchor) ×2 IMPLANT
BENZOIN TINCTURE PRP APPL 2/3 (GAUZE/BANDAGES/DRESSINGS) ×3 IMPLANT
BIT DRILL 1.3M DISPOSABLE (BIT) ×3 IMPLANT
BLADE SURG 11 STRL SS (BLADE) ×3 IMPLANT
BUR OVAL 4.0 (BURR) IMPLANT
CLOSURE STERI-STRIP 1/2X4 (GAUZE/BANDAGES/DRESSINGS) ×1
CLOSURE WOUND 1/2 X4 (GAUZE/BANDAGES/DRESSINGS) ×1
CLOTH BEACON ORANGE TIMEOUT ST (SAFETY) ×3 IMPLANT
CLSR STERI-STRIP ANTIMIC 1/2X4 (GAUZE/BANDAGES/DRESSINGS) ×2 IMPLANT
COVER SURGICAL LIGHT HANDLE (MISCELLANEOUS) ×3 IMPLANT
DRAPE INCISE IOBAN 66X45 STRL (DRAPES) ×3 IMPLANT
DRAPE STERI 35X30 U-POUCH (DRAPES) ×3 IMPLANT
DRAPE U-SHAPE 47X51 STRL (DRAPES) ×3 IMPLANT
DRSG EMULSION OIL 3X3 NADH (GAUZE/BANDAGES/DRESSINGS) ×3 IMPLANT
DRSG PAD ABDOMINAL 8X10 ST (GAUZE/BANDAGES/DRESSINGS) ×6 IMPLANT
DURAPREP 26ML APPLICATOR (WOUND CARE) ×6 IMPLANT
ELECT REM PT RETURN 9FT ADLT (ELECTROSURGICAL) ×3
ELECTRODE REM PT RTRN 9FT ADLT (ELECTROSURGICAL) ×1 IMPLANT
GLOVE BIOGEL PI ORTHO PRO 7.5 (GLOVE) ×2
GLOVE BIOGEL PI ORTHO PRO SZ8 (GLOVE) ×2
GLOVE ORTHO TXT STRL SZ7.5 (GLOVE) ×3 IMPLANT
GLOVE PI ORTHO PRO STRL 7.5 (GLOVE) ×1 IMPLANT
GLOVE PI ORTHO PRO STRL SZ8 (GLOVE) ×1 IMPLANT
GLOVE SURG ORTHO 8.5 STRL (GLOVE) ×3 IMPLANT
GOWN L4 XLG 20 PK N/S (GOWN DISPOSABLE) ×9 IMPLANT
GOWN STRL REIN XL XLG (GOWN DISPOSABLE) IMPLANT
KIT BASIN OR (CUSTOM PROCEDURE TRAY) ×3 IMPLANT
KIT ROOM TURNOVER OR (KITS) ×3 IMPLANT
MANIFOLD NEPTUNE II (INSTRUMENTS) ×3 IMPLANT
NDL SUT .5 MAYO 1.404X.05X (NEEDLE) ×1 IMPLANT
NDL SUT 6 .5 CRC .975X.05 MAYO (NEEDLE) ×1 IMPLANT
NEEDLE HYPO 25GX1X1/2 BEV (NEEDLE) ×3 IMPLANT
NEEDLE MAYO TAPER (NEEDLE) ×4
NEEDLE SPNL 18GX3.5 QUINCKE PK (NEEDLE) ×3 IMPLANT
NS IRRIG 1000ML POUR BTL (IV SOLUTION) ×3 IMPLANT
PACK SHOULDER (CUSTOM PROCEDURE TRAY) ×3 IMPLANT
PAD ABD 8X10 STRL (GAUZE/BANDAGES/DRESSINGS) ×6 IMPLANT
PAD ARMBOARD 7.5X6 YLW CONV (MISCELLANEOUS) ×6 IMPLANT
RESECTOR FULL RADIUS 4.2MM (BLADE) ×3 IMPLANT
SET ARTHROSCOPY TUBING (MISCELLANEOUS) ×2
SET ARTHROSCOPY TUBING LN (MISCELLANEOUS) ×1 IMPLANT
SLING ARM LRG ADULT FOAM STRAP (SOFTGOODS) IMPLANT
SLING ARM MED ADULT FOAM STRAP (SOFTGOODS) IMPLANT
SPONGE GAUZE 4X4 12PLY (GAUZE/BANDAGES/DRESSINGS) ×3 IMPLANT
SPONGE LAP 4X18 X RAY DECT (DISPOSABLE) ×3 IMPLANT
STRIP CLOSURE SKIN 1/2X4 (GAUZE/BANDAGES/DRESSINGS) ×2 IMPLANT
SUT FIBERWIRE #2 38 T-5 BLUE (SUTURE)
SUT MNCRL AB 3-0 PS2 18 (SUTURE) ×3 IMPLANT
SUT VIC AB 0 CT2 27 (SUTURE) ×6 IMPLANT
SUT VIC AB 2-0 CT1 27 (SUTURE) ×2
SUT VIC AB 2-0 CT1 TAPERPNT 27 (SUTURE) ×1 IMPLANT
SUT VICRYL 0 CT 1 36IN (SUTURE) ×3 IMPLANT
SUTURE FIBERWR #2 38 T-5 BLUE (SUTURE) IMPLANT
SYR CONTROL 10ML LL (SYRINGE) ×3 IMPLANT
TAPE PAPER 3X10 WHT MICROPORE (GAUZE/BANDAGES/DRESSINGS) ×3 IMPLANT
TOWEL OR 17X24 6PK STRL BLUE (TOWEL DISPOSABLE) ×3 IMPLANT
TOWEL OR 17X26 10 PK STRL BLUE (TOWEL DISPOSABLE) ×3 IMPLANT
TUBE CONNECTING 12'X1/4 (SUCTIONS) ×1
TUBE CONNECTING 12X1/4 (SUCTIONS) ×2 IMPLANT
WAND 90 DEG TURBOVAC W/CORD (SURGICAL WAND) ×3 IMPLANT
WATER STERILE IRR 1000ML POUR (IV SOLUTION) ×3 IMPLANT

## 2013-03-16 NOTE — Anesthesia Postprocedure Evaluation (Signed)
  Anesthesia Post-op Note  Patient: [name]  Procedure(s) Performed: Procedure(s): RIGHT SHOULDER ARTHROSCOPY WITH SUBACROMIAL DECOMPRESSION AND OPEN ROTATOR CUFF REPAIR, OPEN DISTAL CLAVICLE  RESECTION POSSIBLE BICEP TENODESIS  (Right)  Patient Location: PACU  Anesthesia Type:General  Level of Consciousness: awake, alert  and oriented  Airway and Oxygen Therapy: Patient Spontanous Breathing and Patient connected to nasal cannula oxygen  Post-op Pain: mild  Post-op Assessment: Post-op Vital signs reviewed, Patient's Cardiovascular Status Stable, Respiratory Function Stable, Patent Airway, No signs of Nausea or vomiting and Pain level controlled  Post-op Vital Signs: stable  Complications: No apparent anesthesia complications

## 2013-03-16 NOTE — Anesthesia Procedure Notes (Signed)
Procedure Name: Intubation Date/Time: 03/16/2013 7:43 AM Performed by: Kyung Rudd Pre-anesthesia Checklist: Patient identified, Emergency Drugs available, Suction available, Patient being monitored and Timeout performed Patient Re-evaluated:Patient Re-evaluated prior to inductionOxygen Delivery Method: Circle system utilized Preoxygenation: Pre-oxygenation with 100% oxygen Intubation Type: IV induction Ventilation: Mask ventilation without difficulty and Oral airway inserted - appropriate to patient size Laryngoscope Size: Mac and 3 Grade View: Grade II Tube type: Oral Tube size: 7.0 mm Number of attempts: 1 Airway Equipment and Method: Stylet Placement Confirmation: ETT inserted through vocal cords under direct vision,  positive ETCO2 and breath sounds checked- equal and bilateral Secured at: 22 cm Tube secured with: Tape Dental Injury: Teeth and Oropharynx as per pre-operative assessment

## 2013-03-16 NOTE — Brief Op Note (Signed)
03/16/2013  10:18 AM  PATIENT:  [name]  73 y.o. female  PRE-OPERATIVE DIAGNOSIS:  RIGHT SHOULDER RCT, large, retracted, AC DJD, SLAP  POST-OPERATIVE DIAGNOSIS:  RIGHT SHOULDER RCT, large, retracted, AC DJD, SLAP  PROCEDURE:  Procedure(s): RIGHT SHOULDER ARTHROSCOPY WITH SUBACROMIAL DECOMPRESSION AND OPEN ROTATOR CUFF REPAIR, OPEN DISTAL CLAVICLE  RESECTION POSSIBLE BICEP TENODESIS  (Right)  SURGEON:  Surgeon(s) and Role:    * Augustin Schooling, MD - Primary  PHYSICIAN ASSISTANT:   ASSISTANTS: Ventura Bruns, PA_C   ANESTHESIA:   regional and general  EBL:  Total I/O In: 600 [I.V.:600] Out: 50 [Blood:50]  BLOOD ADMINISTERED:none  DRAINS: none   LOCAL MEDICATIONS USED:  MARCAINE     SPECIMEN:  No Specimen  DISPOSITION OF SPECIMEN:  N/A  COUNTS:  YES  TOURNIQUET:  * No tourniquets in log *  DICTATION: .Other Dictation: Dictation Number [id number]  PLAN OF CARE: Admit for overnight observation  PATIENT DISPOSITION:  PACU - hemodynamically stable.   Delay start of Pharmacological VTE agent (>24hrs) due to surgical blood loss or risk of bleeding: not applicable

## 2013-03-16 NOTE — Progress Notes (Signed)
Orthopedic Tech Progress Note Patient Details:  [name] [birth year] [id number] Shoulder abduction pillow delivered to OR desk Ortho Devices Type of Ortho Device: Shoulder abduction pillow Ortho Device/Splint Interventions: Ordered   [place] 03/16/2013, 11:07 AM

## 2013-03-16 NOTE — Discharge Instructions (Signed)
Ice to the shoulder constantly. Keep the arm propped up on a pillow or couch cushion to rest the arm away from the body. Must wear the sling with the pillow while out of the house.  Gentle exercises with the arm abducted and supported, per OT  Follow up in two weeks with Dr Veverly Fells  [phone]

## 2013-03-16 NOTE — Transfer of Care (Signed)
Immediate Anesthesia Transfer of Care Note  Patient: [name]  Procedure(s) Performed: Procedure(s): RIGHT SHOULDER ARTHROSCOPY WITH SUBACROMIAL DECOMPRESSION AND OPEN ROTATOR CUFF REPAIR, OPEN DISTAL CLAVICLE  RESECTION POSSIBLE BICEP TENODESIS  (Right)  Patient Location: PACU  Anesthesia Type:General  Level of Consciousness: awake, alert  and oriented  Airway & Oxygen Therapy: Patient Spontanous Breathing and Patient connected to nasal cannula oxygen  Post-op Assessment: Report given to PACU RN, Post -op Vital signs reviewed and stable and Patient moving all extremities X 4  Post vital signs: Reviewed and stable  Complications: No apparent anesthesia complications

## 2013-03-16 NOTE — Interval H&P Note (Signed)
History and Physical Interval Note:  03/16/2013 7:28 AM  [name]  has presented today for surgery, with the diagnosis of RIGHT SHOULDER RCT  The various methods of treatment have been discussed with the patient and family. After consideration of risks, benefits and other options for treatment, the patient has consented to  Procedure(s): RIGHT SHOULDER ARTHROSCOPY WITH SUBACROMIAL DECOMPRESSION AND OPEN ROTATOR CUFF REPAIR, OPEN DISTAL CLAVICLE  RESECTION POSSIBLE BICEP TENODESIS  (Right) as a surgical intervention .  The patient's history has been reviewed, patient examined, no change in status, stable for surgery.  I have reviewed the patient's chart and labs.  Questions were answered to the patient's satisfaction.     Arantza Darrington,STEVEN R

## 2013-03-16 NOTE — Anesthesia Preprocedure Evaluation (Addendum)
Anesthesia Evaluation  Patient identified by MRN, date of birth, ID band Patient awake    Reviewed: Allergy & Precautions, H&P , NPO status , Patient's Chart, lab work & pertinent test results  Airway Mallampati: I      Dental  (+) Teeth Intact   Pulmonary shortness of breath and with exertion, asthma , former smoker,          Cardiovascular hypertension, Pt. on medications     Neuro/Psych    GI/Hepatic GERD-  Medicated and Controlled,  Endo/Other    Renal/GU      Musculoskeletal  (+) Fibromyalgia -  Abdominal   Peds  Hematology   Anesthesia Other Findings   Reproductive/Obstetrics                          Anesthesia Physical Anesthesia Plan  ASA: III  Anesthesia Plan: General   Post-op Pain Management:    Induction: Intravenous  Airway Management Planned: Oral ETT  Additional Equipment:   Intra-op Plan:   Post-operative Plan: Extubation in OR  Informed Consent: I have reviewed the patients History and Physical, chart, labs and discussed the procedure including the risks, benefits and alternatives for the proposed anesthesia with the patient or authorized representative who has indicated his/her understanding and acceptance.   Dental advisory given  Plan Discussed with: CRNA and Anesthesiologist  Anesthesia Plan Comments:         Anesthesia Quick Evaluation

## 2013-03-16 NOTE — Preoperative (Signed)
Beta Blockers   Reason not to administer Beta Blockers:Not Applicable 

## 2013-03-17 LAB — BASIC METABOLIC PANEL
BUN: 11 mg/dL (ref 6–23)
CALCIUM: 9 mg/dL (ref 8.4–10.5)
CO2: 26 mEq/L (ref 19–32)
Chloride: 99 mEq/L (ref 96–112)
Creatinine, Ser: 0.71 mg/dL (ref 0.50–1.10)
GFR, EST NON AFRICAN AMERICAN: 85 mL/min — AB (ref >90)
Glucose, Bld: 191 mg/dL — ABNORMAL HIGH (ref 70–99)
POTASSIUM: 4.4 meq/L (ref 3.7–5.3)
SODIUM: 138 meq/L (ref 137–147)

## 2013-03-17 LAB — PROTIME-INR
INR: 1.02 (ref 0.00–1.49)
PROTHROMBIN TIME: 13.2 s (ref 11.6–15.2)

## 2013-03-17 LAB — GLUCOSE, CAPILLARY: Glucose-Capillary: 196 mg/dL — ABNORMAL HIGH (ref 70–99)

## 2013-03-17 LAB — HEMOGLOBIN AND HEMATOCRIT, BLOOD
HEMATOCRIT: 41.4 % (ref 36.0–46.0)
Hemoglobin: 14 g/dL (ref 12.0–15.0)

## 2013-03-17 MED ORDER — OXYCODONE-ACETAMINOPHEN 5-325 MG PO TABS: 1.0000 | ORAL_TABLET | ORAL | Status: DC | PRN

## 2013-03-17 MED ORDER — METHOCARBAMOL 500 MG PO TABS: 500.0000 mg | ORAL_TABLET | Freq: Four times a day (QID) | ORAL | Status: DC | PRN

## 2013-03-17 NOTE — Discharge Summary (Signed)
Physician Discharge Summary   Patient ID: [name] MRN: [id number] DOB/AGE: 71-24-1942 71 y.o.  Admit date: 03/16/2013 Discharge date: 03/17/2013  Admission Diagnoses:  Active Problems:   Rotator cuff (capsule) sprain   Discharge Diagnoses:  Same   Surgeries: Procedure(s): RIGHT SHOULDER ARTHROSCOPY WITH SUBACROMIAL DECOMPRESSION AND OPEN ROTATOR CUFF REPAIR, OPEN DISTAL CLAVICLE  RESECTION POSSIBLE BICEP TENODESIS  on 03/16/2013   Consultants: OT  Discharged Condition: Stable  Hospital Course: [name] is an 73 y.o. female who was admitted 03/16/2013 with a chief complaint of right shoulder pain, and found to have a diagnosis of right shoulder rotator cuff tear.  They were brought to the operating room on 03/16/2013 and underwent the above named procedures.    The patient had an uncomplicated hospital course and was stable for discharge.  Recent vital signs:  Filed Vitals:   03/17/13 0700  BP: 128/67  Pulse: 74  Temp: 97.8 F (36.6 C)  Resp: 18    Recent laboratory studies:  Results for orders placed during the hospital encounter of 03/16/13  APTT      Result Value Range   aPTT 30  24 - 37 seconds  GLUCOSE, CAPILLARY      Result Value Range   Glucose-Capillary 128 (*) 70 - 99 mg/dL  GLUCOSE, CAPILLARY      Result Value Range   Glucose-Capillary 180 (*) 70 - 99 mg/dL   Comment 1 Documented in Chart     Comment 2 Notify RN    CBC      Result Value Range   WBC 11.6 (*) 4.0 - 10.5 K/uL   RBC 4.42  3.87 - 5.11 MIL/uL   Hemoglobin 12.9  12.0 - 15.0 g/dL   HCT 38.5  36.0 - 46.0 %   MCV 87.1  78.0 - 100.0 fL   MCH 29.2  26.0 - 34.0 pg   MCHC 33.5  30.0 - 36.0 g/dL   RDW 14.6  11.5 - 15.5 %   Platelets 219  150 - 400 K/uL  CREATININE, SERUM      Result Value Range   Creatinine, Ser 0.77  0.50 - 1.10 mg/dL   GFR calc non Af Amer 83 (*) >90 mL/min   GFR calc Af Amer >90  >90 mL/min  GLUCOSE, CAPILLARY      Result Value Range   Glucose-Capillary 194  (*) 70 - 99 mg/dL  HEMOGLOBIN AND HEMATOCRIT, BLOOD      Result Value Range   Hemoglobin 14.0  12.0 - 15.0 g/dL   HCT 41.4  36.0 - 59.7 %  BASIC METABOLIC PANEL      Result Value Range   Sodium 138  137 - 147 mEq/L   Potassium 4.4  3.7 - 5.3 mEq/L   Chloride 99  96 - 112 mEq/L   CO2 26  19 - 32 mEq/L   Glucose, Bld 191 (*) 70 - 99 mg/dL   BUN 11  6 - 23 mg/dL   Creatinine, Ser 0.71  0.50 - 1.10 mg/dL   Calcium 9.0  8.4 - 10.5 mg/dL   GFR calc non Af Amer 85 (*) >90 mL/min   GFR calc Af Amer >90  >90 mL/min  PROTIME-INR      Result Value Range   Prothrombin Time 13.2  11.6 - 15.2 seconds   INR 1.02  0.00 - 1.49  GLUCOSE, CAPILLARY      Result Value Range   Glucose-Capillary 162 (*) 70 - 99 mg/dL  GLUCOSE, CAPILLARY      Result Value Range   Glucose-Capillary 196 (*) 70 - 99 mg/dL    Discharge Medications:     Medication List    STOP taking these medications       loperamide 2 MG capsule  Commonly known as:  IMODIUM      TAKE these medications       albuterol 108 (90 BASE) MCG/ACT inhaler  Commonly known as:  PROVENTIL HFA;VENTOLIN HFA  Inhale 2 puffs into the lungs every 6 (six) hours as needed for wheezing or shortness of breath.     ALPRAZolam 0.5 MG tablet  Commonly known as:  XANAX  Take 1 tablet (0.5 mg total) by mouth 3 (three) times daily as needed for sleep or anxiety. Take 1 tab every 6hrs prn     colchicine 0.6 MG tablet  Take 0.6 mg by mouth daily as needed (for gout flare up).     diltiazem 240 MG 24 hr capsule  Commonly known as:  TIAZAC  Take 1 capsule (240 mg total) by mouth daily.     fluticasone 50 MCG/ACT nasal spray  Commonly known as:  FLONASE  Place 2 sprays into both nostrils daily.     furosemide 40 MG tablet  Commonly known as:  LASIX  Take 40 mg by mouth daily.     glimepiride 4 MG tablet  Commonly known as:  AMARYL  Take 4 mg by mouth daily with breakfast.     methocarbamol 500 MG tablet  Commonly known as:  ROBAXIN  Take  1 tablet (500 mg total) by mouth every 6 (six) hours as needed for muscle spasms.     oxyCODONE-acetaminophen 5-325 MG per tablet  Commonly known as:  PERCOCET/ROXICET  Take 1-2 tablets by mouth every 4 (four) hours as needed for moderate pain.     promethazine 25 MG tablet  Commonly known as:  PHENERGAN  Take 1 tablet (25 mg total) by mouth every 6 (six) hours as needed. Only takes for severe nausea     telmisartan 40 MG tablet  Commonly known as:  MICARDIS  Take 40 mg by mouth daily.     warfarin 5 MG tablet  Commonly known as:  COUMADIN  Take 2.5-5 mg by mouth daily. Take 5mg  on Sunday and Thursday. Take 2.5mg  the rest of the week        Diagnostic Studies: Dg Chest 2 View  03/08/2013   CLINICAL DATA:  Atrial fibrillation and shortness of breath  EXAM: CHEST  2 VIEW  COMPARISON:  01/08/2013  FINDINGS: Mild cardiac enlargement stable. Mild calcification of the aortic arch. Vascular pattern normal. Lungs clear. No effusions.  IMPRESSION: Stable mild cardiac enlargement.   Electronically Signed   By: Skipper Cliche M.D.   On: 03/08/2013 11:42    Disposition: 01-Home or Self Care       Future Appointments Provider Department Dept Phone   03/26/2013 10:40 AM [facility] [phone]      Follow-up Information   Follow up with Augustin Schooling, MD. Call in 2 weeks. [phone])    Specialty:  Orthopedic Surgery   Contact information:   [address] [place] [address] [phone]        Signed: Augustin Schooling 03/17/2013, 7:45 AM

## 2013-03-17 NOTE — Evaluation (Signed)
Occupational Therapy Evaluation Patient Details Name: AIKA BRZOSKA MRN: [id number] DOB: 1940/12/01 Today's Date: 03/17/2013 Time: 1005-1050 OT Time Calculation (min): 45 min  OT Assessment / Plan / Recommendation History of present illness 73 y.o. female admitted for RIGHT SHOULDER ARTHROSCOPY WITH SUBACROMIAL DECOMPRESSION AND OPEN ROTATOR CUFF REPAIR, OPEN DISTAL CLAVICLE  RESECTION POSSIBLE BICEP TENODESIS  (Right)   Clinical Impression   Patient evaluated by Occupational Therapy with no further acute OT needs identified. All education has been completed and the patient has no further questions. Education completed.  See below for any follow-up Occupational Therapy or equipment needs. OT is signing off. Thank you for this referral.     OT Assessment  Progress rehab of shoulder as ordered by MD at follow-up appointment    Follow Up Recommendations  Outpatient OT;Supervision/Assistance - 24 hour    Barriers to Discharge      Equipment Recommendations  None recommended by OT    Recommendations for Other Services    Frequency       Precautions / Restrictions Precautions Precautions: Shoulder Type of Shoulder Precautions: Per OT orders ADL instruction; AROM elbow, wrist and  hand; go slow with shoulder and instruct pt in sling care.  Per orders AAROM rt. shoulder, but per pt report MD instructed her to not move Rt. shoulder. Restrictions Weight Bearing Restrictions: Yes Other Position/Activity Restrictions: NWB Rt. UE   Pertinent Vitals/Pain     ADL       OT Diagnosis:    OT Problem List:   OT Treatment Interventions:     OT Goals(Current goals can be found in the care plan section) Acute Rehab OT Goals Patient Stated Goal: To regain full use of Rt. UE  Visit Information  Last OT Received On: 03/17/13 Assistance Needed: +1 History of Present Illness: 73 y.o. female admitted for RIGHT SHOULDER ARTHROSCOPY WITH SUBACROMIAL DECOMPRESSION AND OPEN ROTATOR CUFF REPAIR,  OPEN DISTAL CLAVICLE  RESECTION POSSIBLE BICEP TENODESIS  (Right)       Prior Functioning     Home Living Family/patient expects to be discharged to:: Private residence Living Arrangements: Spouse/significant other Available Help at Discharge: Family;Available 24 hours/day Type of Home: House Home Equipment: Walker - 2 wheels;Cane - single point;Crutches;Shower seat;Grab bars - toilet;Grab bars - tub/shower;Hand held shower head Prior Function Level of Independence: Independent Communication Communication: No difficulties Dominant Hand: Right         Vision/Perception     Cognition  Cognition Arousal/Alertness: Awake/alert Behavior During Therapy: WFL for tasks assessed/performed Overall Cognitive Status: Within Functional Limits for tasks assessed    Extremity/Trunk Assessment Upper Extremity Assessment Upper Extremity Assessment: RUE deficits/detail RUE Deficits / Details: s/p Rt. RCR Cervical / Trunk Assessment Cervical / Trunk Assessment: Other exceptions Cervical / Trunk Exceptions: Pt has a h/o cervical fusion      Mobility       Exercise Shoulder Exercises Elbow Flexion: AROM;Right;10 reps;Seated Elbow Extension: AROM;Right;10 reps;Seated Wrist Flexion: AROM;Right;10 reps;Seated Wrist Extension: AROM;Right;10 reps;Seated Digit Composite Flexion: AROM;Right;10 reps;Seated Composite Extension: AROM;10 reps;Seated Neck Flexion: AROM;5 reps;Seated Neck Extension: AROM;5 reps;Seated Donning/doffing shirt without moving shoulder: Patient able to independently direct caregiver;Caregiver independent with task Method for sponge bathing under operated UE: Patient able to independently direct caregiver;Caregiver independent with task Donning/doffing sling/immobilizer: Patient able to independently direct caregiver Correct positioning of sling/immobilizer: Patient able to independently direct caregiver Pendulum exercises (written home exercise program):  (n/a) ROM  for elbow, wrist and digits of operated UE: Modified independent Sling wearing schedule (  on at all times/off for ADL's): Independent Proper positioning of operated UE when showering: Independent Dressing change:  (n/a) Positioning of UE while sleeping: Independent   Balance General Comments General comments (skin integrity, edema, etc.): Pt with abductor sling in place.  She was dressed and seated in chair.  Pt and family able to describe safe technique for donning/doffing shirt.  She is independent with shoulder precautions.  Pt reports MD told her not to move shoulder so she is very protective of shoulder.  She does not plan to remove sling, although she is able to describe how to remove sling.  She verbalizes understanding of need to keep Rt. shoulder in abduction and discussed options for showers - using foam to maintain abduction or a deflated ball.  Velfoam applied to strapping to reduce friction to neck and increase comfort of sling.  Pt instructed that when she is seated to place pillows under Rt. UE and she could unfasten neck strap to reduce strain on neck, but to tighten neck strap when she is up.    End of Session OT - End of Session Equipment Utilized During Treatment: Other (comment) (abduction sling ) Activity Tolerance: Patient tolerated treatment well Patient left: in chair;with call bell/phone within reach;with family/visitor present Nurse Communication: Other (comment) (pt ready for discharge )  GO Functional Limitation: Self care Self Care Current Status (U9811): At least 40 percent but less than 60 percent impaired, limited or restricted Self Care Goal Status (B1478): At least 40 percent but less than 60 percent impaired, limited or restricted Self Care Discharge Status [phone]): At least 40 percent but less than 60 percent impaired, limited or restricted   Amory Simonetti M 03/17/2013, 11:19 AM

## 2013-03-17 NOTE — Op Note (Signed)
[name], [name]                ACCOUNT NO.:  [id number]  MEDICAL RECORD NO.:  [id number]  LOCATION:  5N21C                        FACILITY:  [facility]  PHYSICIAN:  Doran Heater. Veverly Fells, M.D. DATE OF BIRTH:  13-Jul-1940  DATE OF PROCEDURE:  03/16/2013 DATE OF DISCHARGE:                              OPERATIVE REPORT   PREOPERATIVE DIAGNOSES:  Right shoulder large retracted rotator cuff tear, acromioclavicular joint arthritis, and superior labrum anterior and posterior lesion.  POSTOPERATIVE DIAGNOSES:  Right shoulder large retracted rotator cuff tear, acromioclavicular joint arthritis, and superior labrum anterior and posterior lesion.  PROCEDURES PERFORMED:  Right shoulder arthroscopy with extensive intra- articular debridement of torn superior labrum, anterior-posterior, arthroscopic biceps tenotomy, arthroscopic subacromial decompression with mini-open rotator cuff repair, biceps tenodesis in the groove, and open distal clavicle resection.  ATTENDING SURGEON:  Doran Heater. Veverly Fells, M.D.  ASSISTANT:  Abbott Pao. Dixon, P.A. who was scrubbed to the entire procedure and necessary for satisfactory completion of surgery.  ANESTHESIA:  General anesthesia was used.  There was no interscalene block.  COUNTS:  Instrument counts correct.  COMPLICATIONS:  There were no complications.  ANTIBIOTICS:  Perioperative antibiotics were given.  INDICATIONS:  The patient is a 73 year old female with worsening right shoulder pain secondary to MRI documented rotator cuff tear.  The patient has a two-tendon tear involving supraspinatus, infraspinatus with retraction, and atrophy of her muscles.  The patient does not yet have fixed superior head migration.  The patient does have intact subscapularis and teres minor.  The patient appears to have degenerative superior labral tear and advanced AC arthritis with edema.  Having counseled the patient regarding options __________, we discussed the natural  history of this condition that would lead towards rotator cuff tear arthropathy and declining function and increasing pain versus attempted surgical repair, which she would like to proceed with. Informed consent obtained.  DESCRIPTION OF PROCEDURE:  After an adequate level of anesthesia achieved, the patient positioned in modified beach-chair position. Right shoulder correctly identified, sterilely prepped and draped in usual manner.  Time-out called.  The patient exam under anesthesia revealed full passive range of motion of the shoulder with no undue stiffness or instability.  After sterile prep and drape, we entered the shoulder using standard anterior, posterior, and lateral portals.  We identified significant synovitis and superior labral tearing with biceps nearly 50% torn and performed a biceps tenotomy and labral debridement, back to stable labral rim.  The remaining anterior-inferior, posterior- inferior labrum was intact.  Articular cartilage was in good shape with no significant chondromalacia.  The subscapularis intact.  Supraspinatus- infraspinatus torn and retracted almost to the level of glenoid posteriorly.  The teres minor was intact.  We placed the scope in subacromial space.  Thorough bursectomy, acromioplasty was performed, keeping the __________ ligament.  We created nice open subacromial outlet for the rotator cuff.  The tear was a deep U-shape tear but pretty thick __________ tendon thickness.  At this point, we concluded the subacromial decompression.  We then did a small saber incision overlying the AC joint.  Dissection carried down through subcutaneous tissues.  We identified the deltotrapezial fascia and divided in line with distal clavicle.  Subperiosteal dissection of distal clavicle performed followed by excision of distal 1-2 mm of bone as there was already some osteolysis there.  We also removed some dorsal osteophytes off the acromion.  We did a thorough  irrigation of the AC interval, applied bone wax to cut into the clavicle.  We then repaired the deltotrapezius fascia with 0 Vicryl suture.  It actually tore as we were finishing our repair.  We then did a subcutaneous repair full-thickness with 2-0 Vicryl, which gave [place] nice coverage of the [facility] joint, but there definitely was a fascial defect on the dorsal aspect of the [facility] joint. Next, we addressed the rotator cuff and the biceps through a single mini- open incision.  We started about a cm to 2 cm lateral to the anterolateral border of the acromion to preserve that CA ligament.  We dissected down through subcutaneous tissue, split the deltoid bluntly, placed __________, identified the biceps groove, delivered the tendon out of the wound.  We prepared the floor of the biceps groove with a Bovie and curette.  We placed a single __________ anchor to the floor of the groove and brought that suture up through the tendon in a whip- stitching fashion with the elbow at 90 degrees mid tension.  We had nice low-profile repair and clipped the remaining tendon away.  We then addressed the rotator cuff tear, which was a massive two-tendon tear, first by getting control over it with #2 Hi-Fi suture placed in the free end of the tendon.  We were careful to make sure we had both the underneath layer on the joint side and the upper layers __________ some delamination.  We were able to basically create a single-layer full- thickness repair.  We immobilized from posteriorly anteriorly.  We used Cobb elevator to free up both the bursal surface and the joint surface, and we were able to get that back into place with a little bit of arm abduction and we used 2 anchors adjacent to the articular cartilage, those were  __________ anchors, double-loaded #2 Hi-Fi suture, which we brought up through the medial portion of footprint with a mattress fashion.  We had 4 free-edged tendon sutures, simple sutures, and  we took through drill holes in the bone out laterally and then we sewed to the rotator interval anteriorly and the subscapularis with #2 Hi-Fi repairing the rotator interval.  We had a nice repair, not under too much tension, just a little bit there with the arm on the patient's side.  So, __________ going to be resting in abduction and a little bit of forward flexion, but we then irrigated the subdeltoid plane thoroughly and repaired deltoid anatomically with 0 Vicryl suture followed by 2-0 Vicryl subcutaneous closure and 4-0 Monocryl for skin and portals.  Steri-Strips were applied followed by a sterile dressing. The patient tolerated the surgery well.     Doran Heater. Veverly Fells, M.D.     SRN/MEDQ  D:  03/16/2013  T:  03/17/2013  Job:  [id number]

## 2013-03-17 NOTE — Progress Notes (Signed)
Subjective: 1 Day Post-Op Procedure(s) (LRB): RIGHT SHOULDER ARTHROSCOPY WITH SUBACROMIAL DECOMPRESSION AND OPEN ROTATOR CUFF REPAIR, OPEN DISTAL CLAVICLE  RESECTION POSSIBLE BICEP TENODESIS  (Right) Patient reports pain as moderate.    Objective: Vital signs in last 24 hours: Temp:  [97.4 F (36.3 C)-98.2 F (36.8 C)] 97.8 F (36.6 C) (01/24 0700) Pulse Rate:  [57-75] 74 (01/24 0700) Resp:  [12-20] 18 (01/24 0700) BP: (99-152)/(41-80) 128/67 mmHg (01/24 0700) SpO2:  [93 %-100 %] 99 % (01/24 0700)  Intake/Output from previous day: 01/23 0701 - 01/24 0700 In: 2320 [P.O.:1220; I.Cooper.:1100] Out: 50 [Blood:50] Intake/Output this shift:     Recent Labs  03/16/13 1630 03/17/13 0551  HGB 12.9 14.0    Recent Labs  03/16/13 1630 03/17/13 0551  WBC 11.6*  --   RBC 4.42  --   HCT 38.5 41.4  PLT 219  --     Recent Labs  03/16/13 1630 03/17/13 0551  NA  --  138  K  --  4.4  CL  --  99  CO2  --  26  BUN  --  11  CREATININE 0.77 0.71  GLUCOSE  --  191*  CALCIUM  --  9.0    Recent Labs  03/15/13 1055 03/17/13 0551  INR 0.9 1.02    Neurologically intact Neurovascular intact Incision: no drainage No cellulitis present  Assessment/Plan: 1 Day Post-Op Procedure(s) (LRB): RIGHT SHOULDER ARTHROSCOPY WITH SUBACROMIAL DECOMPRESSION AND OPEN ROTATOR CUFF REPAIR, OPEN DISTAL CLAVICLE  RESECTION POSSIBLE BICEP TENODESIS  (Right) D/C IV fluids Discharge home  [name] 03/17/2013, 7:34 AM

## 2013-03-19 DIAGNOSIS — M25519 Pain in unspecified shoulder: Secondary | ICD-10-CM | POA: Diagnosis not present

## 2013-03-20 ENCOUNTER — Encounter (HOSPITAL_COMMUNITY): Payer: Self-pay | Admitting: Orthopedic Surgery

## 2013-03-22 ENCOUNTER — Emergency Department (HOSPITAL_COMMUNITY)
Admission: EM | Admit: 2013-03-22 | Discharge: 2013-03-22 | Disposition: A | Discharge status: Home / Self Care | Payer: Medicare Other | Attending: Emergency Medicine | Admitting: Emergency Medicine

## 2013-03-22 ENCOUNTER — Emergency Department (HOSPITAL_COMMUNITY): Payer: Medicare Other

## 2013-03-22 ENCOUNTER — Encounter (HOSPITAL_COMMUNITY): Payer: Self-pay | Admitting: Emergency Medicine

## 2013-03-22 ENCOUNTER — Telehealth: Payer: Self-pay | Admitting: Internal Medicine

## 2013-03-22 DIAGNOSIS — J209 Acute bronchitis, unspecified: Secondary | ICD-10-CM | POA: Diagnosis not present

## 2013-03-22 DIAGNOSIS — R0602 Shortness of breath: Secondary | ICD-10-CM | POA: Insufficient documentation

## 2013-03-22 DIAGNOSIS — R609 Edema, unspecified: Secondary | ICD-10-CM | POA: Insufficient documentation

## 2013-03-22 DIAGNOSIS — Z8669 Personal history of other diseases of the nervous system and sense organs: Secondary | ICD-10-CM | POA: Insufficient documentation

## 2013-03-22 DIAGNOSIS — R21 Rash and other nonspecific skin eruption: Secondary | ICD-10-CM | POA: Insufficient documentation

## 2013-03-22 DIAGNOSIS — Z885 Allergy status to narcotic agent status: Secondary | ICD-10-CM | POA: Insufficient documentation

## 2013-03-22 DIAGNOSIS — J45909 Unspecified asthma, uncomplicated: Secondary | ICD-10-CM | POA: Diagnosis not present

## 2013-03-22 DIAGNOSIS — M109 Gout, unspecified: Secondary | ICD-10-CM | POA: Diagnosis not present

## 2013-03-22 DIAGNOSIS — Z888 Allergy status to other drugs, medicaments and biological substances status: Secondary | ICD-10-CM | POA: Insufficient documentation

## 2013-03-22 DIAGNOSIS — Z87891 Personal history of nicotine dependence: Secondary | ICD-10-CM | POA: Insufficient documentation

## 2013-03-22 DIAGNOSIS — M1990 Unspecified osteoarthritis, unspecified site: Secondary | ICD-10-CM | POA: Insufficient documentation

## 2013-03-22 DIAGNOSIS — Z85038 Personal history of other malignant neoplasm of large intestine: Secondary | ICD-10-CM | POA: Insufficient documentation

## 2013-03-22 DIAGNOSIS — Z7901 Long term (current) use of anticoagulants: Secondary | ICD-10-CM | POA: Insufficient documentation

## 2013-03-22 DIAGNOSIS — Z8679 Personal history of other diseases of the circulatory system: Secondary | ICD-10-CM | POA: Insufficient documentation

## 2013-03-22 DIAGNOSIS — I4891 Unspecified atrial fibrillation: Secondary | ICD-10-CM | POA: Insufficient documentation

## 2013-03-22 DIAGNOSIS — R42 Dizziness and giddiness: Secondary | ICD-10-CM | POA: Diagnosis not present

## 2013-03-22 DIAGNOSIS — G971 Other reaction to spinal and lumbar puncture: Secondary | ICD-10-CM | POA: Diagnosis not present

## 2013-03-22 DIAGNOSIS — J40 Bronchitis, not specified as acute or chronic: Secondary | ICD-10-CM | POA: Diagnosis not present

## 2013-03-22 DIAGNOSIS — K250 Acute gastric ulcer with hemorrhage: Secondary | ICD-10-CM | POA: Insufficient documentation

## 2013-03-22 DIAGNOSIS — R350 Frequency of micturition: Secondary | ICD-10-CM | POA: Insufficient documentation

## 2013-03-22 DIAGNOSIS — IMO0001 Reserved for inherently not codable concepts without codable children: Secondary | ICD-10-CM | POA: Insufficient documentation

## 2013-03-22 DIAGNOSIS — K5900 Constipation, unspecified: Secondary | ICD-10-CM | POA: Insufficient documentation

## 2013-03-22 DIAGNOSIS — I10 Essential (primary) hypertension: Secondary | ICD-10-CM | POA: Insufficient documentation

## 2013-03-22 DIAGNOSIS — R351 Nocturia: Secondary | ICD-10-CM | POA: Insufficient documentation

## 2013-03-22 DIAGNOSIS — E785 Hyperlipidemia, unspecified: Secondary | ICD-10-CM | POA: Diagnosis not present

## 2013-03-22 DIAGNOSIS — Z8619 Personal history of other infectious and parasitic diseases: Secondary | ICD-10-CM | POA: Insufficient documentation

## 2013-03-22 DIAGNOSIS — G8929 Other chronic pain: Secondary | ICD-10-CM | POA: Diagnosis not present

## 2013-03-22 DIAGNOSIS — K219 Gastro-esophageal reflux disease without esophagitis: Secondary | ICD-10-CM | POA: Insufficient documentation

## 2013-03-22 DIAGNOSIS — J309 Allergic rhinitis, unspecified: Secondary | ICD-10-CM | POA: Insufficient documentation

## 2013-03-22 DIAGNOSIS — M19039 Primary osteoarthritis, unspecified wrist: Secondary | ICD-10-CM | POA: Diagnosis not present

## 2013-03-22 DIAGNOSIS — K648 Other hemorrhoids: Secondary | ICD-10-CM | POA: Diagnosis not present

## 2013-03-22 DIAGNOSIS — B3731 Acute candidiasis of vulva and vagina: Secondary | ICD-10-CM | POA: Diagnosis not present

## 2013-03-22 DIAGNOSIS — E119 Type 2 diabetes mellitus without complications: Secondary | ICD-10-CM | POA: Insufficient documentation

## 2013-03-22 DIAGNOSIS — M549 Dorsalgia, unspecified: Secondary | ICD-10-CM | POA: Insufficient documentation

## 2013-03-22 DIAGNOSIS — Z860100 Personal history of colon polyps, unspecified: Secondary | ICD-10-CM | POA: Insufficient documentation

## 2013-03-22 DIAGNOSIS — Z800 Family history of malignant neoplasm of digestive organs: Secondary | ICD-10-CM | POA: Insufficient documentation

## 2013-03-22 DIAGNOSIS — B373 Candidiasis of vulva and vagina: Secondary | ICD-10-CM | POA: Diagnosis not present

## 2013-03-22 DIAGNOSIS — R209 Unspecified disturbances of skin sensation: Secondary | ICD-10-CM | POA: Insufficient documentation

## 2013-03-22 DIAGNOSIS — Z79899 Other long term (current) drug therapy: Secondary | ICD-10-CM | POA: Insufficient documentation

## 2013-03-22 DIAGNOSIS — G4730 Sleep apnea, unspecified: Secondary | ICD-10-CM | POA: Diagnosis not present

## 2013-03-22 DIAGNOSIS — Z86718 Personal history of other venous thrombosis and embolism: Secondary | ICD-10-CM | POA: Insufficient documentation

## 2013-03-22 DIAGNOSIS — Z8489 Family history of other specified conditions: Secondary | ICD-10-CM | POA: Insufficient documentation

## 2013-03-22 MED ORDER — HYDROMORPHONE HCL PF 1 MG/ML IJ SOLN
0.5000 mg | Freq: Once | INTRAMUSCULAR | Status: AC
  Administered 2013-03-22: 0.5 mg via INTRAMUSCULAR
  Filled 2013-03-22: qty 1

## 2013-03-22 MED ORDER — COLCHICINE 0.6 MG PO TABS
0.6000 mg | ORAL_TABLET | Freq: Once | ORAL | Status: AC
  Administered 2013-03-22: 0.6 mg via ORAL
  Filled 2013-03-22: qty 1

## 2013-03-22 MED ORDER — POLYETHYLENE GLYCOL 3350 17 GM/SCOOP PO POWD: 17.0000 g | Freq: Every day | ORAL | Status: DC

## 2013-03-22 MED ORDER — PREDNISONE 20 MG PO TABS
60.0000 mg | ORAL_TABLET | Freq: Once | ORAL | Status: AC
  Administered 2013-03-22: 60 mg via ORAL
  Filled 2013-03-22: qty 3

## 2013-03-22 NOTE — ED Provider Notes (Signed)
CSN: [id number]     Arrival date & time 03/22/13  0125 History   First MD Initiated Contact with Patient 03/22/13 0146     Chief Complaint  Patient presents with  . Wrist Pain  . Gout   HPI  History provided by the patient. The patient is a 73 year old female with history of degenerative joint disease, osteoarthritis and gout who presents with concerns for gout and severe pain in the right wrist. Patient underwent recent right shoulder surgery last week. She has been resting her arm in a sling and doing physical therapy as prescribed. Yesterday evening she began to have pain and swelling in the right wrist area. She reports some mild redness and increased warmth. Pain is very sharp and severe and feels similar to previous gout in her same right thumb. Patient tried using her indomethacin as well one of her prescribed Percocets without any improvement. She has tried to keep the wrist elevated and use small amount of ice but nothing has helped. She is unable to rest her be comfortable due to the pain. Pain does not radiate. She denies any pain or swelling in the fingers. No fever, chills or sweats.   Past Medical History  Diagnosis Date  . Allergic rhinitis   . Asthma   . Gout     takes Colchicine prn  . Peptic ulcer disease   . History of PSVT (paroxysmal supraventricular tachycardia)   . HLD (hyperlipidemia)     pt doesn't take any medication for this  . DJD (degenerative joint disease)   . History of blood clots     right lung in early 90's  . Sleep apnea     doesn't use CPAP  . Shortness of breath     pt states that she can be sitting as well as exertion and get short of breath  . Bronchitis     in Jan 2013-uses Albuterol prn  . History of migraine headaches     last migraine 71yrs ago;Pt states she does get sinus headaches  . Osteoarthritis   . Chronic back pain     hx buldging disc  . Rash     on neck and started after she started taking her beta blocker 9yrs ago;medical MD  has seen this  . Gastric ulcer   . Hemorrhoids   . Family hx of colon cancer   . Diabetes mellitus     glimepiride  . Yeast infection of the vagina     being treated for this now by dr.fontaine  . Bronchitis, acute 03-01-11    FINSHED Z-PAK-STILL ON PREDNISONE NOW  . Peripheral edema     takes Furosemide every other day  . Dizziness     occasionally  . Constipation     takes Colace prn  . Colon cancer 2007    s/p surgery and chemo  . Urinary frequency   . Nocturia   . Anxiety     and panic attacks;pt states that she is claustrophobic  . Family history of anesthesia complication     daughter gets PONV  . Hypertension     takes Micardis and Diltiazem daily  . Atrial fibrillation     takes Coumadin daily-instructed to stop  . Spinal headache   . Numbness     left hand  . Fibromyalgia     but doesn't take any meds  . GERD (gastroesophageal reflux disease)     uses Tums prn  . History of colon polyps   .  Urinary frequency   . Peripheral edema     takes Lasix daily  . History of shingles    Past Surgical History  Procedure Laterality Date  . Caesarean section  1966/69/72    x 3  . Heel spur surgery  1992    x 2  . Ankle surgery      tumor-benign removed left ankle  . Lumbar disc surgery  2008  . Pyelonidal cystectomy      at age 75  . Knee arthrosocpy      bil;couple of years before knee replacement  . Ganglion cyst excision  > 58yrs ago    removed from left pointer finger  . Cataract surgery  2010  . Port a cath placed  2008  . Port a cath removed  2008  . Knee surgery      left TKA  . Anterior cervical decomp/discectomy fusion  01/12/2011    Procedure: ANTERIOR CERVICAL DECOMPRESSION/DISCECTOMY FUSION 2 LEVELS;  Surgeon: Cooper Render Pool;  Location: [place] [facility];  Service: Neurosurgery;  Laterality: Bilateral;  Cervical five-six, six-seven anterior cervical discectomy and fusion with allograft and plating  . Hysteroscopy w/d&c  02/19/2011    Procedure: DILATATION  AND CURETTAGE /HYSTEROSCOPY;  Surgeon: Anastasio Auerbach, MD;  Location: [facility];  Service: Gynecology;  Laterality: N/A;  requests one hour  . Dilation and curettage of uterus  02-19-11    & POLYP REMOVAL  . Anterior cervical decomp/discectomy fusion  06/11/2011    Procedure: ANTERIOR CERVICAL DECOMPRESSION/DISCECTOMY FUSION 1 LEVEL/HARDWARE REMOVAL;  Surgeon: Charlie Pitter, MD;  Location: [place] [facility];  Service: Neurosurgery;  Laterality: N/A;  Cervical four-five anterior cervical decompression fusion with allograft and plating; Removal of Cervical five to seven plate  . Joint replacement Left     knee  . Colectomy  2007    colon cancer  . Cardiac catheterization  90's and 2005  . Tubal ligation  1972  . Colonoscopy    . Bilateral cataracts removed    . Shoulder arthroscopy with subacromial decompression and open rotator c Right 03/16/2013    Procedure: RIGHT SHOULDER ARTHROSCOPY WITH SUBACROMIAL DECOMPRESSION AND OPEN ROTATOR CUFF REPAIR, OPEN DISTAL CLAVICLE  RESECTION POSSIBLE BICEP TENODESIS ;  Surgeon: Augustin Schooling, MD;  Location: [facility];  Service: Orthopedics;  Laterality: Right;   Family History  Problem Relation Age of Onset  . Coronary artery disease Other   . Heart attack Mother   . Stroke Mother   . Hypertension Mother   . Heart disease Father   . Hypertension Sister   . Cancer Sister     left kidney cancer  . Cancer Brother     bladder  . Cancer Brother     liver, spread to colon  . Liver cancer Brother   . Anesthesia problems Neg Hx   . Hypotension Neg Hx   . Malignant hyperthermia Neg Hx   . Pseudochol deficiency Neg Hx    History  Substance Use Topics  . Smoking status: Former Smoker -- 1.50 packs/day for 30 years    Types: Cigarettes  . Smokeless tobacco: Never Used     Comment: quit smoking 67yrs ago  . Alcohol Use: No   OB History   Grav Para Term Preterm Abortions TAB SAB Ect Mult Living   3 3        3      Review of Systems  Constitutional:  Negative for fever, chills and diaphoresis.  Neurological: Negative for  weakness and numbness.  All other systems reviewed and are negative.    Allergies  Benazepril; Adhesive; Morphine; Nifedipine; and Piroxicam  Home Medications   Current Outpatient Rx  Name  Route  Sig  Dispense  Refill  . albuterol (PROVENTIL HFA;VENTOLIN HFA) 108 (90 BASE) MCG/ACT inhaler   Inhalation   Inhale 2 puffs into the lungs every 6 (six) hours as needed for wheezing or shortness of breath.         . ALPRAZolam (XANAX) 0.5 MG tablet   Oral   Take 1 tablet (0.5 mg total) by mouth 3 (three) times daily as needed for sleep or anxiety. Take 1 tab every 6hrs prn   50 tablet   1   . diltiazem (TIAZAC) 240 MG 24 hr capsule   Oral   Take 1 capsule (240 mg total) by mouth daily.   30 capsule   6   . fluticasone (FLONASE) 50 MCG/ACT nasal spray   Each Nare   Place 2 sprays into both nostrils daily.   16 g   6   . furosemide (LASIX) 40 MG tablet   Oral   Take 40 mg by mouth daily.         [name] glimepiride (AMARYL) 4 MG tablet   Oral   Take 4 mg by mouth daily with breakfast.         . indomethacin (INDOCIN) 25 MG capsule   Oral   Take 25 mg by mouth 3 (three) times daily as needed for mild pain.         [name] oxyCODONE-acetaminophen (PERCOCET/ROXICET) 5-325 MG per tablet   Oral   Take 1-2 tablets by mouth every 4 (four) hours as needed for moderate pain.   60 tablet   0   . promethazine (PHENERGAN) 25 MG tablet   Oral   Take 1 tablet (25 mg total) by mouth every 6 (six) hours as needed. Only takes for severe nausea   60 tablet   3   . telmisartan (MICARDIS) 40 MG tablet   Oral   Take 40 mg by mouth daily.         [name] warfarin (COUMADIN) 5 MG tablet   Oral   Take 2.5-5 mg by mouth See admin instructions. Take 1 tablet on Sunday and Thursday. Take 0.5 tablet all other days.          BP 186/79  Pulse 80  Temp(Src) 97.8 F (36.6 C) (Oral)  Resp 16  Ht 5\' 3"  (1.6 m)  Wt 257 lb  (116.574 kg)  BMI 45.54 kg/m2  SpO2 97%  LMP 12/30/2000 Physical Exam  Nursing note and vitals reviewed. Constitutional: She is oriented to person, place, and time. She appears well-developed and well-nourished. No distress.  HENT:  Head: Normocephalic.  Cardiovascular: Normal rate and regular rhythm.   Pulmonary/Chest: Effort normal and breath sounds normal. No respiratory distress.  Musculoskeletal:  Surgical scars is bandaging over the right shoulder. No erythema or increased swelling. Limited range of motion secondary to pain. No numbness or decreased sensation of the lateral deltoid.  The right wrist is diffusely swollen with mild erythema and increased warmth. Area is exquisitely tender to palpation or manipulation. Radial pulse intact. Normal capillary refill to the fingers. No tenderness in the fingers or hand.  Neurological: She is alert and oriented to person, place, and time.  Skin: Skin is warm and dry. No rash noted.  Psychiatric: She has a normal mood and affect. Her behavior is  normal.    ED Course  Procedures   DIAGNOSTIC STUDIES: Oxygen Saturation is 97% on room air.    COORDINATION OF CARE:  Nursing notes reviewed. Vital signs reviewed. Initial pt interview and examination performed.   Patient seen and evaluated. Patient appears uncomfortable. Right arm is in a sling. There is increased warmth and diffuse tenderness the right wrist. Very exquisite pain. Normal radial pulse. Normal sensations in the hand and fingers. No erythematous streaks.   X-rays reviewed. No concerning findings on x-ray. Patient does have history of gout in the same right thumb and hand area. Symptoms are consistent with gout treated. she is afebrile. Doubt any infection. Incisional sites at the shoulder appear of well-healing.  Patient feeling much better after medical treatment. At this time she will be discharged home to followup with her PCP. Agrees with plan.  Treatment plan  initiated: Medications  HYDROmorphone (DILAUDID) injection 0.5 mg (not administered)  colchicine tablet 0.6 mg (0.6 mg Oral Given 03/22/13 0308)  predniSONE (DELTASONE) tablet 60 mg (60 mg Oral Given 03/22/13 0249)  HYDROmorphone (DILAUDID) injection 0.5 mg (0.5 mg Intramuscular Given 03/22/13 0249)    Imaging Review Dg Wrist Complete Right  03/22/2013   CLINICAL DATA:  Pain, history of gout  EXAM: RIGHT WRIST - COMPLETE 3+ VIEW  COMPARISON:  None.  FINDINGS: Normal alignment. Negative for fracture. Distal radius, ulna and carpal bones intact. Minor degenerative changes of the carpal bones along the radial aspect of the wrist.  IMPRESSION: Degenerative changes.  No acute osseous finding or fracture   Electronically Signed   By: Daryll Brod M.D.   On: 03/22/2013 02:23     MDM   1. Gout of wrist        Martie Lee, PA-C 03/22/13 2240

## 2013-03-22 NOTE — Discharge Instructions (Signed)
Continue to use your medications at home and follow up with your primary care provider later today for continued evaluation and treatment. Use a stool softener such as MiraLax to prevent constipation from your oxycodone.   Gout Gout is an inflammatory arthritis caused by a buildup of uric acid crystals in the joints. Uric acid is a chemical that is normally present in the blood. When the level of uric acid in the blood is too high it can form crystals that deposit in your joints and tissues. This causes joint redness, soreness, and swelling (inflammation). Repeat attacks are common. Over time, uric acid crystals can form into masses (tophi) near a joint, destroying bone and causing disfigurement. Gout is treatable and often preventable. CAUSES  The disease begins with elevated levels of uric acid in the blood. Uric acid is produced by your body when it breaks down a naturally found substance called purines. Certain foods you eat, such as meats and fish, contain high amounts of purines. Causes of an elevated uric acid level include:  Being passed down from parent to child (heredity).  Diseases that cause increased uric acid production (such as obesity, psoriasis, and certain cancers).  Excessive alcohol use.  Diet, especially diets rich in meat and seafood.  Medicines, including certain cancer-fighting medicines (chemotherapy), water pills (diuretics), and aspirin.  Chronic kidney disease. The kidneys are no longer able to remove uric acid well.  Problems with metabolism. Conditions strongly associated with gout include:  Obesity.  High blood pressure.  High cholesterol.  Diabetes. Not everyone with elevated uric acid levels gets gout. It is not understood why some people get gout and others do not. Surgery, joint injury, and eating too much of certain foods are some of the factors that can lead to gout attacks. SYMPTOMS   An attack of gout comes on quickly. It causes intense pain  with redness, swelling, and warmth in a joint.  Fever can occur.  Often, only one joint is involved. Certain joints are more commonly involved:  Base of the big toe.  Knee.  Ankle.  Wrist.  Finger. Without treatment, an attack usually goes away in a few days to weeks. Between attacks, you usually will not have symptoms, which is different from many other forms of arthritis. DIAGNOSIS  Your caregiver will suspect gout based on your symptoms and exam. In some cases, tests may be recommended. The tests may include:  Blood tests.  Urine tests.  X-rays.  Joint fluid exam. This exam requires a needle to remove fluid from the joint (arthrocentesis). Using a microscope, gout is confirmed when uric acid crystals are seen in the joint fluid. TREATMENT  There are two phases to gout treatment: treating the sudden onset (acute) attack and preventing attacks (prophylaxis).  Treatment of an Acute Attack.  Medicines are used. These include anti-inflammatory medicines or steroid medicines.  An injection of steroid medicine into the affected joint is sometimes necessary.  The painful joint is rested. Movement can worsen the arthritis.  You may use warm or cold treatments on painful joints, depending which works best for you.  Treatment to Prevent Attacks.  If you suffer from frequent gout attacks, your caregiver may advise preventive medicine. These medicines are started after the acute attack subsides. These medicines either help your kidneys eliminate uric acid from your body or decrease your uric acid production. You may need to stay on these medicines for a very long time.  The early phase of treatment with preventive medicine can  be associated with an increase in acute gout attacks. For this reason, during the first few months of treatment, your caregiver may also advise you to take medicines usually used for acute gout treatment. Be sure you understand your caregiver's directions. Your  caregiver may make several adjustments to your medicine dose before these medicines are effective.  Discuss dietary treatment with your caregiver or dietitian. Alcohol and drinks high in sugar and fructose and foods such as meat, poultry, and seafood can increase uric acid levels. Your caregiver or dietician can advise you on drinks and foods that should be limited. HOME CARE INSTRUCTIONS   Do not take aspirin to relieve pain. This raises uric acid levels.  Only take over-the-counter or prescription medicines for pain, discomfort, or fever as directed by your caregiver.  Rest the joint as much as possible. When in bed, keep sheets and blankets off painful areas.  Keep the affected joint raised (elevated).  Apply warm or cold treatments to painful joints. Use of warm or cold treatments depends on which works best for you.  Use crutches if the painful joint is in your leg.  Drink enough fluids to keep your urine clear or pale yellow. This helps your body get rid of uric acid. Limit alcohol, sugary drinks, and fructose drinks.  Follow your dietary instructions. Pay careful attention to the amount of protein you eat. Your daily diet should emphasize fruits, vegetables, whole grains, and fat-free or low-fat milk products. Discuss the use of coffee, vitamin C, and cherries with your caregiver or dietician. These may be helpful in lowering uric acid levels.  Maintain a healthy body weight. SEEK MEDICAL CARE IF:   You develop diarrhea, vomiting, or any side effects from medicines.  You do not feel better in 24 hours, or you are getting worse. SEEK IMMEDIATE MEDICAL CARE IF:   Your joint becomes suddenly more tender, and you have chills or a fever. MAKE SURE YOU:   Understand these instructions.  Will watch your condition.  Will get help right away if you are not doing well or get worse. Document Released: 02/06/2000 Document Revised: 06/05/2012 Document Reviewed: 09/22/2011 [facility]  Patient Information 2014 [employer].

## 2013-03-22 NOTE — ED Notes (Signed)
Patient transported to X-ray 

## 2013-03-22 NOTE — Telephone Encounter (Signed)
Patient is calling to inquire about receiving medication for her Gout. She was seen at the hospital and was told to follow up with Dr. Linda Hedges. Advised patient that he could see her on Monday. She wants to know if she could have her Gout medication refilled before the weekend. Please advise.

## 2013-03-22 NOTE — ED Notes (Signed)
Pt states that she had surgery on her right shoulder a week ago and she is complaining about gout pain to her right wrist. Pt has home medications for pain but states that they are not working for the gout. Pt denies injury to wrist.

## 2013-03-22 NOTE — Telephone Encounter (Signed)
Don't see gout med on list. Please advise on what to give her.

## 2013-03-22 NOTE — Telephone Encounter (Signed)
Jul '14 Uric Acid level 4.3 - normal range.. Stopped/removed inodomethacin from list - on warfarin.  For flare of gout she should take colchcine 0.6 mg - tabs and may repeat 1 tab at 2 hours if pain has not subsided. May send inf Rx for #10 with 3 refills.

## 2013-03-22 NOTE — ED Provider Notes (Signed)
Medical screening examination/treatment/procedure(s) were performed by non-physician practitioner and as supervising physician I was immediately available for consultation/collaboration.   Teressa Lower, MD 03/22/13 [phone]

## 2013-03-23 MED ORDER — COLCHICINE 0.6 MG PO TABS: ORAL_TABLET | ORAL | Status: DC

## 2013-03-23 NOTE — Telephone Encounter (Signed)
Left a message for patient to return call. Colchcine already sent to [facility] on [address]

## 2013-03-26 ENCOUNTER — Ambulatory Visit (INDEPENDENT_AMBULATORY_CARE_PROVIDER_SITE_OTHER): Payer: Medicare Other | Admitting: Internal Medicine

## 2013-03-26 ENCOUNTER — Encounter: Payer: Self-pay | Admitting: Internal Medicine

## 2013-03-26 VITALS — BP 132/76 | HR 80 | Temp 98.4°F | Wt 260.8 lb

## 2013-03-26 DIAGNOSIS — M25519 Pain in unspecified shoulder: Secondary | ICD-10-CM | POA: Diagnosis not present

## 2013-03-26 DIAGNOSIS — S43429A Sprain of unspecified rotator cuff capsule, initial encounter: Secondary | ICD-10-CM

## 2013-03-26 DIAGNOSIS — M75101 Unspecified rotator cuff tear or rupture of right shoulder, not specified as traumatic: Secondary | ICD-10-CM

## 2013-03-26 DIAGNOSIS — M109 Gout, unspecified: Secondary | ICD-10-CM

## 2013-03-26 NOTE — Assessment & Plan Note (Signed)
Gout - right wrist looks a lot better. Continue to take the colchicine once a day until the gout pain is gone.   For any acute flare of gout take two (2) colchicine tablets together and you may take one more tablet 2 hours later if you do not have relief. DO NOT take indomethacine or any non-steroidal medication (aleve, ibuprofen, etc) be cause you are taking warfarin.

## 2013-03-26 NOTE — Assessment & Plan Note (Signed)
Recent arthroscopic repair right rotator cuff Jan 23rd. Has started PT but is having a lot of pain. She has increased Oxycodone/APAP from 2 per day to 3-4.  Plan Per surgeon  Will allow early refill of next Rx and adjust her medication/Rxs as needed.

## 2013-03-26 NOTE — Progress Notes (Signed)
Subjective:    Patient ID: [name], female    DOB: 03/20/1940, 73 y.o.   MRN: [id number]  HPI Mrs. Mancuso is s/p right shoulder arthroscopy 03/16/13 w/. She then was seen in the ED for a flare of gout right thumb and wrist for which she was given dilaudid and prednisone. She was sent home with colchicine 0.6 to take daily - although she did not know what it was for. She is still having wrist pain.   She is having continued pain in the right wrist but the swelling is down and there is no erythema'  Past Medical History  Diagnosis Date  . Allergic rhinitis   . Asthma   . Gout     takes Colchicine prn  . Peptic ulcer disease   . History of PSVT (paroxysmal supraventricular tachycardia)   . HLD (hyperlipidemia)     pt doesn't take any medication for this  . DJD (degenerative joint disease)   . History of blood clots     right lung in early 90's  . Sleep apnea     doesn't use CPAP  . Shortness of breath     pt states that she can be sitting as well as exertion and get short of breath  . Bronchitis     in Jan 2013-uses Albuterol prn  . History of migraine headaches     last migraine 36yrs ago;Pt states she does get sinus headaches  . Osteoarthritis   . Chronic back pain     hx buldging disc  . Rash     on neck and started after she started taking her beta blocker 68yrs ago;medical MD has seen this  . Gastric ulcer   . Hemorrhoids   . Family hx of colon cancer   . Diabetes mellitus     glimepiride  . Yeast infection of the vagina     being treated for this now by dr.fontaine  . Bronchitis, acute 03-01-11    FINSHED Z-PAK-STILL ON PREDNISONE NOW  . Peripheral edema     takes Furosemide every other day  . Dizziness     occasionally  . Constipation     takes Colace prn  . Colon cancer 2007    s/p surgery and chemo  . Urinary frequency   . Nocturia   . Anxiety     and panic attacks;pt states that she is claustrophobic  . Family history of anesthesia complication    daughter gets PONV  . Hypertension     takes Micardis and Diltiazem daily  . Atrial fibrillation     takes Coumadin daily-instructed to stop  . Spinal headache   . Numbness     left hand  . Fibromyalgia     but doesn't take any meds  . GERD (gastroesophageal reflux disease)     uses Tums prn  . History of colon polyps   . Urinary frequency   . Peripheral edema     takes Lasix daily  . History of shingles    Past Surgical History  Procedure Laterality Date  . Caesarean section  1966/69/72    x 3  . Heel spur surgery  1992    x 2  . Ankle surgery      tumor-benign removed left ankle  . Lumbar disc surgery  2008  . Pyelonidal cystectomy      at age 65  . Knee arthrosocpy      bil;couple of years before knee replacement  .  Ganglion cyst excision  > 37yrs ago    removed from left pointer finger  . Cataract surgery  2010  . Port a cath placed  2008  . Port a cath removed  2008  . Knee surgery      left TKA  . Anterior cervical decomp/discectomy fusion  01/12/2011    Procedure: ANTERIOR CERVICAL DECOMPRESSION/DISCECTOMY FUSION 2 LEVELS;  Surgeon: Cooper Render Pool;  Location: [facility];  Service: Neurosurgery;  Laterality: Bilateral;  Cervical five-six, six-seven anterior cervical discectomy and fusion with allograft and plating  . Hysteroscopy w/d&c  02/19/2011    Procedure: DILATATION AND CURETTAGE /HYSTEROSCOPY;  Surgeon: Anastasio Auerbach, MD;  Location: [facility];  Service: Gynecology;  Laterality: N/A;  requests one hour  . Dilation and curettage of uterus  02-19-11    & POLYP REMOVAL  . Anterior cervical decomp/discectomy fusion  06/11/2011    Procedure: ANTERIOR CERVICAL DECOMPRESSION/DISCECTOMY FUSION 1 LEVEL/HARDWARE REMOVAL;  Surgeon: Charlie Pitter, MD;  Location: [facility];  Service: Neurosurgery;  Laterality: N/A;  Cervical four-five anterior cervical decompression fusion with allograft and plating; Removal of Cervical five to seven plate  . Joint replacement Left      knee  . Colectomy  2007    colon cancer  . Cardiac catheterization  90's and 2005  . Tubal ligation  1972  . Colonoscopy    . Bilateral cataracts removed    . Shoulder arthroscopy with subacromial decompression and open rotator c Right 03/16/2013    Procedure: RIGHT SHOULDER ARTHROSCOPY WITH SUBACROMIAL DECOMPRESSION AND OPEN ROTATOR CUFF REPAIR, OPEN DISTAL CLAVICLE  RESECTION POSSIBLE BICEP TENODESIS ;  Surgeon: Augustin Schooling, MD;  Location: [facility];  Service: Orthopedics;  Laterality: Right;   Family History  Problem Relation Age of Onset  . Coronary artery disease Other   . Heart attack Mother   . Stroke Mother   . Hypertension Mother   . Heart disease Father   . Hypertension Sister   . Cancer Sister     left kidney cancer  . Cancer Brother     bladder  . Cancer Brother     liver, spread to colon  . Liver cancer Brother   . Anesthesia problems Neg Hx   . Hypotension Neg Hx   . Malignant hyperthermia Neg Hx   . Pseudochol deficiency Neg Hx    History   Social History  . Marital Status: Married    Spouse Name: N/A    Number of Children: 3  . Years of Education: N/A   Occupational History  . retired    Social History Main Topics  . Smoking status: Former Smoker -- 1.50 packs/day for 30 years    Types: Cigarettes  . Smokeless tobacco: Never Used     Comment: quit smoking 10yrs ago  . Alcohol Use: No  . Drug Use: No  . Sexual Activity: Yes    Partners: Male    Birth Control/ Protection: Post-menopausal   Other Topics Concern  . Not on file   Social History Narrative   Regular exercise- no.    \ Current Outpatient Prescriptions on File Prior to Visit  Medication Sig Dispense Refill  . albuterol (PROVENTIL HFA;VENTOLIN HFA) 108 (90 BASE) MCG/ACT inhaler Inhale 2 puffs into the lungs every 6 (six) hours as needed for wheezing or shortness of breath.      . ALPRAZolam (XANAX) 0.5 MG tablet Take 1 tablet (0.5 mg total) by mouth 3 (three) times daily  as needed  for sleep or anxiety. Take 1 tab every 6hrs prn  50 tablet  1  . colchicine 0.6 MG tablet TAKE 1 TABLET DAILY. MAY TAKE ANOTHER (1 TAB) IF PAIN HAS NOT SUBSIDED  10 tablet  3  . diltiazem (TIAZAC) 240 MG 24 hr capsule Take 1 capsule (240 mg total) by mouth daily.  30 capsule  6  . fluticasone (FLONASE) 50 MCG/ACT nasal spray Place 2 sprays into both nostrils daily.  16 g  6  . furosemide (LASIX) 40 MG tablet Take 40 mg by mouth daily.      [name] glimepiride (AMARYL) 4 MG tablet Take 4 mg by mouth daily with breakfast.      . oxyCODONE-acetaminophen (PERCOCET/ROXICET) 5-325 MG per tablet Take 1-2 tablets by mouth every 4 (four) hours as needed for moderate pain.  60 tablet  0  . polyethylene glycol powder (GLYCOLAX/MIRALAX) powder Take 17 g by mouth daily.  255 g  0  . promethazine (PHENERGAN) 25 MG tablet Take 1 tablet (25 mg total) by mouth every 6 (six) hours as needed. Only takes for severe nausea  60 tablet  3  . telmisartan (MICARDIS) 40 MG tablet Take 40 mg by mouth daily.      [name] warfarin (COUMADIN) 5 MG tablet Take 2.5-5 mg by mouth See admin instructions. Take 1 tablet on Sunday and Thursday. Take 0.5 tablet all other days.      . [DISCONTINUED] digoxin (LANOXIN) 0.125 MG tablet Take 125 mcg by mouth daily.       No current facility-administered medications on file prior to visit.     Review of Systems System review is negative for any constitutional, cardiac, pulmonary, GI or neuro symptoms or complaints other than as described in the HPI.     Objective:   Physical Exam Filed Vitals:   03/26/13 0811  BP: 132/76  Pulse: 80  Temp: 98.4 F (36.9 C)   Wt Readings from Last 3 Encounters:  03/26/13 260 lb 12.8 oz (118.298 kg)  03/22/13 257 lb (116.574 kg)  03/16/13 255 lb 15.3 oz (116.1 kg)   Gen'l - obese woman in no distress HEENT- normal Cor- 2+ radial, RRR Pulm - normal respirations MSK - shoulder in sling and dressing intact. Right wrist with some swelling at base of the  thumb, no erythema, heat or exquisite pain.        Assessment & Plan:

## 2013-03-26 NOTE — Patient Instructions (Signed)
Shoulder - per Orthopedist. OK to take up to 4 oxycodone per day. If you need to refill your Rx early the pharmacist can call us for permission. Also, we can refill the Rx early if you run out due to increased dosing.  Gout - looks a lot better. Continue to take the colchicine once a day until the gout pain is gone.   For any acute flare of gout take two (2) colchicine tablets together and you may take one more tablet 2 hours later if you do not have relief. DO NOT take indomethacine or any non-steroidal medication (aleve, ibuprofen, etc) be cause you are taking warfarin.

## 2013-03-26 NOTE — Progress Notes (Signed)
Pre visit review using our clinic review tool, if applicable. No additional management support is needed unless otherwise documented below in the visit note. 

## 2013-03-29 DIAGNOSIS — M25519 Pain in unspecified shoulder: Secondary | ICD-10-CM | POA: Diagnosis not present

## 2013-04-02 DIAGNOSIS — M25519 Pain in unspecified shoulder: Secondary | ICD-10-CM | POA: Diagnosis not present

## 2013-04-05 DIAGNOSIS — M25519 Pain in unspecified shoulder: Secondary | ICD-10-CM | POA: Diagnosis not present

## 2013-04-09 DIAGNOSIS — M25519 Pain in unspecified shoulder: Secondary | ICD-10-CM | POA: Diagnosis not present

## 2013-04-25 DIAGNOSIS — M25519 Pain in unspecified shoulder: Secondary | ICD-10-CM | POA: Diagnosis not present

## 2013-05-08 ENCOUNTER — Ambulatory Visit (INDEPENDENT_AMBULATORY_CARE_PROVIDER_SITE_OTHER): Payer: Medicare Other | Admitting: *Deleted

## 2013-05-08 DIAGNOSIS — Z5181 Encounter for therapeutic drug level monitoring: Secondary | ICD-10-CM | POA: Diagnosis not present

## 2013-05-08 DIAGNOSIS — Z7901 Long term (current) use of anticoagulants: Secondary | ICD-10-CM | POA: Diagnosis not present

## 2013-05-08 DIAGNOSIS — I4891 Unspecified atrial fibrillation: Secondary | ICD-10-CM

## 2013-05-08 LAB — POCT INR: INR: 2

## 2013-05-09 DIAGNOSIS — M25519 Pain in unspecified shoulder: Secondary | ICD-10-CM | POA: Diagnosis not present

## 2013-05-11 ENCOUNTER — Telehealth: Payer: Self-pay

## 2013-05-11 MED ORDER — PREDNISONE 10 MG PO TABS: 10.0000 mg | ORAL_TABLET | Freq: Every day | ORAL | Status: DC

## 2013-05-11 NOTE — Telephone Encounter (Signed)
Patient has been advised

## 2013-05-11 NOTE — Telephone Encounter (Signed)
Phone call from patient [phone] stating gout is not better. Colchicine is expensive and did not help much. What else do you recommend? Please advise.

## 2013-05-11 NOTE — Telephone Encounter (Signed)
If the colchicine did not help it is less likely to be gout and more likely to be arthritis.  Plan  prednisone burst and taper (Rx sent to pharmacy)

## 2013-06-06 DIAGNOSIS — M25519 Pain in unspecified shoulder: Secondary | ICD-10-CM | POA: Diagnosis not present

## 2013-06-08 ENCOUNTER — Ambulatory Visit (INDEPENDENT_AMBULATORY_CARE_PROVIDER_SITE_OTHER): Payer: Medicare Other | Admitting: *Deleted

## 2013-06-08 DIAGNOSIS — Z5181 Encounter for therapeutic drug level monitoring: Secondary | ICD-10-CM

## 2013-06-08 DIAGNOSIS — I4891 Unspecified atrial fibrillation: Secondary | ICD-10-CM | POA: Diagnosis not present

## 2013-06-08 DIAGNOSIS — Z7901 Long term (current) use of anticoagulants: Secondary | ICD-10-CM | POA: Diagnosis not present

## 2013-06-08 LAB — POCT INR: INR: 3

## 2013-06-20 DIAGNOSIS — M25519 Pain in unspecified shoulder: Secondary | ICD-10-CM | POA: Diagnosis not present

## 2013-06-21 ENCOUNTER — Telehealth: Payer: Self-pay | Admitting: *Deleted

## 2013-06-21 NOTE — Telephone Encounter (Signed)
Pt called requesting Oxycodone refill.  Former pt of Dr Linda Hedges.  Please advise

## 2013-06-21 NOTE — Telephone Encounter (Signed)
OK #30 This prescription will be refilled one time by  me; it will be necessary for him to establish with a primary care physician for additional refills. I am not taking new patients at this time as I shall be retiring on/1/17.

## 2013-06-22 MED ORDER — OXYCODONE-ACETAMINOPHEN 5-325 MG PO TABS: 1.0000 | ORAL_TABLET | ORAL | Status: DC | PRN

## 2013-06-22 NOTE — Telephone Encounter (Signed)
Called pt and advised that her rx refill will be ready to pick up after lunch time today and that she will need to est a PCP in order to get any additional refill.

## 2013-06-28 DIAGNOSIS — R209 Unspecified disturbances of skin sensation: Secondary | ICD-10-CM | POA: Diagnosis not present

## 2013-06-28 DIAGNOSIS — M19249 Secondary osteoarthritis, unspecified hand: Secondary | ICD-10-CM | POA: Diagnosis not present

## 2013-06-28 DIAGNOSIS — M6740 Ganglion, unspecified site: Secondary | ICD-10-CM | POA: Diagnosis not present

## 2013-07-02 ENCOUNTER — Other Ambulatory Visit: Payer: Self-pay

## 2013-07-02 MED ORDER — GLIMEPIRIDE 4 MG PO TABS: 4.0000 mg | ORAL_TABLET | Freq: Every day | ORAL | Status: DC

## 2013-07-03 ENCOUNTER — Telehealth: Payer: Self-pay | Admitting: Internal Medicine

## 2013-07-03 ENCOUNTER — Encounter: Payer: Self-pay | Admitting: Pharmacist

## 2013-07-03 DIAGNOSIS — E119 Type 2 diabetes mellitus without complications: Secondary | ICD-10-CM | POA: Diagnosis not present

## 2013-07-03 DIAGNOSIS — I4891 Unspecified atrial fibrillation: Secondary | ICD-10-CM | POA: Diagnosis not present

## 2013-07-03 DIAGNOSIS — I10 Essential (primary) hypertension: Secondary | ICD-10-CM | POA: Diagnosis not present

## 2013-07-03 DIAGNOSIS — Z0000 Encounter for general adult medical examination without abnormal findings: Secondary | ICD-10-CM | POA: Diagnosis not present

## 2013-07-03 DIAGNOSIS — E1169 Type 2 diabetes mellitus with other specified complication: Secondary | ICD-10-CM | POA: Diagnosis not present

## 2013-07-03 DIAGNOSIS — N390 Urinary tract infection, site not specified: Secondary | ICD-10-CM | POA: Diagnosis not present

## 2013-07-03 DIAGNOSIS — J45909 Unspecified asthma, uncomplicated: Secondary | ICD-10-CM | POA: Diagnosis not present

## 2013-07-03 LAB — PROTIME-INR: INR: 1 (ref 0.9–1.1)

## 2013-07-03 NOTE — Telephone Encounter (Signed)
New message     Want Dr Harrington Challenger to know that pt stopped taking coumadin on her own approx 06-18-13.  She made an appt for pt to see Dr Harrington Challenger to discuss this on 08-03-13 (next available). You do not have to call Bethena Roys back but you may want to call pt

## 2013-07-03 NOTE — Telephone Encounter (Signed)
Left message for pt to call.

## 2013-07-04 NOTE — Telephone Encounter (Signed)
I spoke with patient, and she restarted her warfarin yesterday (07/03/13) at dose of warfarin 2.5 mg qd except 5 mg Thursday and Sunday (her previous dose).  She will start on Cipro 500 mg bid today x 5 days by PCP. INR was 1.0 yesterday by PCP Bernie Covey with [facility]).   Patient scheduled to have protime drawn in 1 week. Patient agreeable to this.  To [name] to make aware patient back on warfarin.

## 2013-07-10 DIAGNOSIS — I10 Essential (primary) hypertension: Secondary | ICD-10-CM | POA: Diagnosis not present

## 2013-07-11 ENCOUNTER — Ambulatory Visit (INDEPENDENT_AMBULATORY_CARE_PROVIDER_SITE_OTHER): Payer: Medicare Other | Admitting: Surgery

## 2013-07-11 DIAGNOSIS — I4891 Unspecified atrial fibrillation: Secondary | ICD-10-CM

## 2013-07-11 DIAGNOSIS — Z5181 Encounter for therapeutic drug level monitoring: Secondary | ICD-10-CM

## 2013-07-11 DIAGNOSIS — Z7901 Long term (current) use of anticoagulants: Secondary | ICD-10-CM

## 2013-07-11 LAB — POCT INR: INR: 1.6

## 2013-07-17 ENCOUNTER — Other Ambulatory Visit: Payer: Self-pay | Admitting: Pharmacist Clinician (PhC)/ Clinical Pharmacy Specialist

## 2013-07-17 MED ORDER — WARFARIN SODIUM 5 MG PO TABS: ORAL_TABLET | ORAL | Status: DC

## 2013-07-19 DIAGNOSIS — Z4789 Encounter for other orthopedic aftercare: Secondary | ICD-10-CM | POA: Diagnosis not present

## 2013-07-28 DIAGNOSIS — R209 Unspecified disturbances of skin sensation: Secondary | ICD-10-CM | POA: Diagnosis not present

## 2013-08-02 NOTE — Progress Notes (Addendum)
No show

## 2013-08-03 ENCOUNTER — Ambulatory Visit (INDEPENDENT_AMBULATORY_CARE_PROVIDER_SITE_OTHER): Payer: Medicare Other | Admitting: Internal Medicine

## 2013-08-03 DIAGNOSIS — I4891 Unspecified atrial fibrillation: Secondary | ICD-10-CM | POA: Diagnosis not present

## 2013-08-03 DIAGNOSIS — I10 Essential (primary) hypertension: Secondary | ICD-10-CM

## 2013-08-03 DIAGNOSIS — R209 Unspecified disturbances of skin sensation: Secondary | ICD-10-CM | POA: Diagnosis not present

## 2013-08-15 ENCOUNTER — Encounter: Payer: Self-pay | Admitting: Internal Medicine

## 2013-09-28 DIAGNOSIS — E119 Type 2 diabetes mellitus without complications: Secondary | ICD-10-CM | POA: Diagnosis not present

## 2013-09-28 DIAGNOSIS — I10 Essential (primary) hypertension: Secondary | ICD-10-CM | POA: Diagnosis not present

## 2013-09-28 DIAGNOSIS — M79609 Pain in unspecified limb: Secondary | ICD-10-CM | POA: Diagnosis not present

## 2013-09-28 DIAGNOSIS — I4891 Unspecified atrial fibrillation: Secondary | ICD-10-CM | POA: Diagnosis not present

## 2013-09-30 NOTE — Progress Notes (Signed)
HPI:   Patient is a 73 yo who was previously followed by Lowella Dell  She has a history of atrial fibrillatoin and hypertension. I saw her in clinic in December 2014  She had an appt in June but did not come.   Seen by Dr Laurann Montana  Had told him about spells of low bloodd pressure   When does physical activity and then takes shower.  After washing hair gets real weak.  Took BP once was 70  Can't walk because of knees Gives out when tries to work in [employer]. Current Outpatient Prescriptions  Medication Sig Dispense Refill  . albuterol (PROVENTIL HFA;VENTOLIN HFA) 108 (90 BASE) MCG/ACT inhaler Inhale 2 puffs into the lungs every 6 (six) hours as needed for wheezing or shortness of breath.      . ALPRAZolam (XANAX) 0.5 MG tablet Take 1 tablet (0.5 mg total) by mouth 3 (three) times daily as needed for sleep or anxiety. Take 1 tab every 6hrs prn  50 tablet  1  . diltiazem (TIAZAC) 240 MG 24 hr capsule Take 1 capsule (240 mg total) by mouth daily.  30 capsule  6  . fluticasone (FLONASE) 50 MCG/ACT nasal spray Place 2 sprays into both nostrils daily.  16 g  6  . furosemide (LASIX) 40 MG tablet Take 40 mg by mouth daily.      [name] glimepiride (AMARYL) 4 MG tablet Take 1 tablet (4 mg total) by mouth daily with breakfast.  30 tablet  5  . oxyCODONE-acetaminophen (PERCOCET/ROXICET) 5-325 MG per tablet Take 1-2 tablets by mouth every 4 (four) hours as needed for moderate pain.  30 tablet  0  . predniSONE (DELTASONE) 10 MG tablet Take 1 tablet (10 mg total) by mouth daily. 3 tabs daily for 3 days; 2 tabs daily for 3 days; 1 tab daily for 6 days  21 tablet  0  . promethazine (PHENERGAN) 25 MG tablet Take 1 tablet (25 mg total) by mouth every 6 (six) hours as needed. Only takes for severe nausea  60 tablet  3  . telmisartan (MICARDIS) 40 MG tablet Take 40 mg by mouth daily.      [name] warfarin (COUMADIN) 5 MG tablet Take 1 tablet by mouth daily or as directed by coumadin clinic  30 tablet  5  . [DISCONTINUED] digoxin  (LANOXIN) 0.125 MG tablet Take 125 mcg by mouth daily.       No current facility-administered medications for this visit.    Allergies  Allergen Reactions  . Benazepril Other (See Comments)    coughing  . Adhesive [Tape] Other (See Comments)    redness  . Morphine Other (See Comments)       Makes you feel bad all over  . Nifedipine Hives and Itching  . Piroxicam Hives and Itching    Past Medical History  Diagnosis Date  . Allergic rhinitis   . Asthma   . Gout     takes Colchicine prn  . Peptic ulcer disease   . History of PSVT (paroxysmal supraventricular tachycardia)   . HLD (hyperlipidemia)     pt doesn't take any medication for this  . DJD (degenerative joint disease)   . History of blood clots     right lung in early 90's  . Sleep apnea     doesn't use CPAP  . Shortness of breath     pt states that she can be sitting as well as exertion and get short of breath  .  Bronchitis     in Jan 2013-uses Albuterol prn  . History of migraine headaches     last migraine 64yrs ago;Pt states she does get sinus headaches  . Osteoarthritis   . Chronic back pain     hx buldging disc  . Rash     on neck and started after she started taking her beta blocker 19yrs ago;medical MD has seen this  . Gastric ulcer   . Hemorrhoids   . Family hx of colon cancer   . Diabetes mellitus     glimepiride  . Yeast infection of the vagina     being treated for this now by dr.fontaine  . Bronchitis, acute 03-01-11    FINSHED Z-PAK-STILL ON PREDNISONE NOW  . Peripheral edema     takes Furosemide every other day  . Dizziness     occasionally  . Constipation     takes Colace prn  . Colon cancer 2007    s/p surgery and chemo  . Urinary frequency   . Nocturia   . Anxiety     and panic attacks;pt states that she is claustrophobic  . Family history of anesthesia complication     daughter gets PONV  . Hypertension     takes Micardis and Diltiazem daily  . Atrial fibrillation     takes  Coumadin daily-instructed to stop  . Spinal headache   . Numbness     left hand  . Fibromyalgia     but doesn't take any meds  . GERD (gastroesophageal reflux disease)     uses Tums prn  . History of colon polyps   . Urinary frequency   . Peripheral edema     takes Lasix daily  . History of shingles     Past Surgical History  Procedure Laterality Date  . Caesarean section  1966/69/72    x 3  . Heel spur surgery  1992    x 2  . Ankle surgery      tumor-benign removed left ankle  . Lumbar disc surgery  2008  . Pyelonidal cystectomy      at age 3  . Knee arthrosocpy      bil;couple of years before knee replacement  . Ganglion cyst excision  > 29yrs ago    removed from left pointer finger  . Cataract surgery  2010  . Port a cath placed  2008  . Port a cath removed  2008  . Knee surgery      left TKA  . Anterior cervical decomp/discectomy fusion  01/12/2011    Procedure: ANTERIOR CERVICAL DECOMPRESSION/DISCECTOMY FUSION 2 LEVELS;  Surgeon: Cooper Render Pool;  Location: [facility];  Service: Neurosurgery;  Laterality: Bilateral;  Cervical five-six, six-seven anterior cervical discectomy and fusion with allograft and plating  . Hysteroscopy w/d&c  02/19/2011    Procedure: DILATATION AND CURETTAGE /HYSTEROSCOPY;  Surgeon: Anastasio Auerbach, MD;  Location: [facility];  Service: Gynecology;  Laterality: N/A;  requests one hour  . Dilation and curettage of uterus  02-19-11    & POLYP REMOVAL  . Anterior cervical decomp/discectomy fusion  06/11/2011    Procedure: ANTERIOR CERVICAL DECOMPRESSION/DISCECTOMY FUSION 1 LEVEL/HARDWARE REMOVAL;  Surgeon: Charlie Pitter, MD;  Location: [facility] [facility];  Service: Neurosurgery;  Laterality: N/A;  Cervical four-five anterior cervical decompression fusion with allograft and plating; Removal of Cervical five to seven plate  . Joint replacement Left     knee  . Colectomy  2007  colon cancer  . Cardiac catheterization  90's and 2005  . Tubal ligation   1972  . Colonoscopy    . Bilateral cataracts removed    . Shoulder arthroscopy with subacromial decompression and open rotator c Right 03/16/2013    Procedure: RIGHT SHOULDER ARTHROSCOPY WITH SUBACROMIAL DECOMPRESSION AND OPEN ROTATOR CUFF REPAIR, OPEN DISTAL CLAVICLE  RESECTION POSSIBLE BICEP TENODESIS ;  Surgeon: Augustin Schooling, MD;  Location: [place];  Service: Orthopedics;  Laterality: Right;    Family History  Problem Relation Age of Onset  . Coronary artery disease Other   . Heart attack Mother   . Stroke Mother   . Hypertension Mother   . Heart disease Father   . Hypertension Sister   . Cancer Sister     left kidney cancer  . Cancer Brother     bladder  . Cancer Brother     liver, spread to colon  . Liver cancer Brother   . Anesthesia problems Neg Hx   . Hypotension Neg Hx   . Malignant hyperthermia Neg Hx   . Pseudochol deficiency Neg Hx     History   Social History  . Marital Status: Married    Spouse Name: N/A    Number of Children: 3  . Years of Education: N/A   Occupational History  . retired    Social History Main Topics  . Smoking status: Former Smoker -- 1.50 packs/day for 30 years    Types: Cigarettes  . Smokeless tobacco: Never Used     Comment: quit smoking 24yrs ago  . Alcohol Use: No  . Drug Use: No  . Sexual Activity: Yes    Partners: Male    Birth Control/ Protection: Post-menopausal   Other Topics Concern  . Not on file   Social History Narrative   Regular exercise- no.    ROS: Please see the HPI.  All other systems reviewed and negative.  PHYSICAL EXAM: BP 155/85  P 75   Wt 251   Orthostatics:  Lying:  153/76  P 74   Sitting:  BP155/85  P75  Standing 0 min:  140/74  P80  Dizzy  Standing 2 min:  154/83  P80 General: Well developed, well nourished, in no acute distress. Head:  Normocephalic and atraumatic. Neck: no JVD Lungs: Clear to auscultation and percussion. Heart: Normal S1 and S2.  No murmur, rubs or gallops.  Pulses:  Pulses normal in all 4 extremities. Extremities: No clubbing or cyanosis. No edema. Neurologic: Alert and oriented x 3.  EKG:  NSR.    75 bpm  ASSESSMENT AND PLAN:  1  Atrial fib.  Continue on current regimen  Will have INR checked  2.  Dizziness.  On speaking to the patient today the spells of dizziness occur only after she has been outside working in yard and then takes a shower.  I think these conditions are what set up events  I have asked her to drink fluids, cool down before showering  She will try  3.  HTN  BP is a little high today but with spells above will follow  Keep on same regimen    2.  HTN

## 2013-10-01 ENCOUNTER — Ambulatory Visit (INDEPENDENT_AMBULATORY_CARE_PROVIDER_SITE_OTHER): Payer: Medicare Other | Admitting: Pharmacist

## 2013-10-01 ENCOUNTER — Ambulatory Visit (INDEPENDENT_AMBULATORY_CARE_PROVIDER_SITE_OTHER): Payer: Medicare Other | Admitting: Internal Medicine

## 2013-10-01 ENCOUNTER — Encounter: Payer: Self-pay | Admitting: Internal Medicine

## 2013-10-01 VITALS — BP 155/85 | HR 75 | Ht 63.0 in | Wt 251.0 lb

## 2013-10-01 DIAGNOSIS — Z7901 Long term (current) use of anticoagulants: Secondary | ICD-10-CM

## 2013-10-01 DIAGNOSIS — I4891 Unspecified atrial fibrillation: Secondary | ICD-10-CM

## 2013-10-01 DIAGNOSIS — Z5181 Encounter for therapeutic drug level monitoring: Secondary | ICD-10-CM

## 2013-10-01 LAB — POCT INR: INR: 2.8

## 2013-10-22 DIAGNOSIS — E1139 Type 2 diabetes mellitus with other diabetic ophthalmic complication: Secondary | ICD-10-CM | POA: Diagnosis not present

## 2013-10-22 DIAGNOSIS — H2640 Unspecified secondary cataract: Secondary | ICD-10-CM | POA: Diagnosis not present

## 2013-10-24 ENCOUNTER — Telehealth: Payer: Self-pay | Admitting: Internal Medicine

## 2013-10-24 NOTE — Telephone Encounter (Signed)
New problem   Pt is having neck pain radiating down to shoulder and left side to arm. Please advise pt.

## 2013-10-24 NOTE — Telephone Encounter (Signed)
Spoke with pt who reports shortness of breath and pain in left neck, shoulder and arm.   Not associated with movement. Has not done any lifting or recent activity with left arm. Describes as very painful--10/10. First happened last night and she took pain pill and pain eventually went away. Pain has returned at present time. She has taken pain pill today with only slight relief but she states she knows pain will come back. Pt saw Dr. Harrington Challenger recently and was not having this pain.  Pain is new and not like she has had in past. Pt report she has had neck/back surgery in the past but this pain is different.  Pt reports she is concerned it is her heart.  I told pt she needs to go to ED to have this pain evaluated.

## 2013-11-01 ENCOUNTER — Ambulatory Visit (INDEPENDENT_AMBULATORY_CARE_PROVIDER_SITE_OTHER): Payer: Medicare Other

## 2013-11-01 DIAGNOSIS — Z5181 Encounter for therapeutic drug level monitoring: Secondary | ICD-10-CM

## 2013-11-01 DIAGNOSIS — Z7901 Long term (current) use of anticoagulants: Secondary | ICD-10-CM

## 2013-11-01 DIAGNOSIS — I4891 Unspecified atrial fibrillation: Secondary | ICD-10-CM | POA: Diagnosis not present

## 2013-11-01 LAB — POCT INR: INR: 2.4

## 2013-11-15 DIAGNOSIS — H26499 Other secondary cataract, unspecified eye: Secondary | ICD-10-CM | POA: Diagnosis not present

## 2013-11-15 DIAGNOSIS — H2640 Unspecified secondary cataract: Secondary | ICD-10-CM | POA: Diagnosis not present

## 2013-11-16 DIAGNOSIS — M79609 Pain in unspecified limb: Secondary | ICD-10-CM | POA: Diagnosis not present

## 2013-11-22 DIAGNOSIS — H26491 Other secondary cataract, right eye: Secondary | ICD-10-CM | POA: Diagnosis not present

## 2013-11-22 DIAGNOSIS — H2640 Unspecified secondary cataract: Secondary | ICD-10-CM | POA: Diagnosis not present

## 2013-11-22 HISTORY — PX: CARPAL TUNNEL RELEASE: SHX101

## 2013-11-26 DIAGNOSIS — D1779 Benign lipomatous neoplasm of other sites: Secondary | ICD-10-CM | POA: Diagnosis not present

## 2013-11-26 DIAGNOSIS — M60232 Foreign body granuloma of soft tissue, not elsewhere classified, left forearm: Secondary | ICD-10-CM | POA: Diagnosis not present

## 2013-11-26 DIAGNOSIS — G5602 Carpal tunnel syndrome, left upper limb: Secondary | ICD-10-CM | POA: Diagnosis not present

## 2013-11-26 DIAGNOSIS — D1739 Benign lipomatous neoplasm of skin and subcutaneous tissue of other sites: Secondary | ICD-10-CM | POA: Diagnosis not present

## 2013-11-29 DIAGNOSIS — M25532 Pain in left wrist: Secondary | ICD-10-CM | POA: Diagnosis not present

## 2013-11-29 DIAGNOSIS — M79642 Pain in left hand: Secondary | ICD-10-CM | POA: Diagnosis not present

## 2013-12-05 ENCOUNTER — Telehealth: Payer: Self-pay | Admitting: Internal Medicine

## 2013-12-05 NOTE — Telephone Encounter (Signed)
Received request from Nurse fax box, documents faxed for surgical clearance. To: [employer] Fax number: [phone] Attention: [phone]/km

## 2013-12-24 ENCOUNTER — Encounter: Payer: Self-pay | Admitting: Internal Medicine

## 2013-12-31 DIAGNOSIS — M545 Low back pain: Secondary | ICD-10-CM | POA: Diagnosis not present

## 2014-01-07 ENCOUNTER — Encounter: Payer: Medicare Other | Admitting: Internal Medicine

## 2014-01-07 DIAGNOSIS — M545 Low back pain: Secondary | ICD-10-CM | POA: Diagnosis not present

## 2014-01-07 NOTE — Progress Notes (Signed)
This encounter was created in error - please disregard.

## 2014-01-09 ENCOUNTER — Other Ambulatory Visit: Payer: Self-pay | Admitting: Internal Medicine

## 2014-01-15 DIAGNOSIS — M545 Low back pain: Secondary | ICD-10-CM | POA: Diagnosis not present

## 2014-01-16 ENCOUNTER — Other Ambulatory Visit: Payer: Self-pay | Admitting: Internal Medicine

## 2014-01-16 DIAGNOSIS — N289 Disorder of kidney and ureter, unspecified: Secondary | ICD-10-CM | POA: Diagnosis not present

## 2014-01-22 ENCOUNTER — Ambulatory Visit
Admission: RE | Admit: 2014-01-22 | Discharge: 2014-01-22 | Disposition: A | Discharge status: Home / Self Care | Payer: Medicare Other | Source: Ambulatory Visit | Attending: Internal Medicine | Admitting: Internal Medicine

## 2014-01-22 DIAGNOSIS — N289 Disorder of kidney and ureter, unspecified: Secondary | ICD-10-CM

## 2014-01-22 DIAGNOSIS — K760 Fatty (change of) liver, not elsewhere classified: Secondary | ICD-10-CM | POA: Diagnosis not present

## 2014-01-22 DIAGNOSIS — N281 Cyst of kidney, acquired: Secondary | ICD-10-CM | POA: Diagnosis not present

## 2014-01-23 ENCOUNTER — Encounter: Payer: Self-pay | Admitting: Internal Medicine

## 2014-01-24 ENCOUNTER — Encounter: Payer: Self-pay | Admitting: Internal Medicine

## 2014-01-29 ENCOUNTER — Encounter: Payer: Self-pay | Admitting: Internal Medicine

## 2014-04-17 ENCOUNTER — Other Ambulatory Visit: Payer: Self-pay | Admitting: Internal Medicine

## 2014-05-14 DIAGNOSIS — G5602 Carpal tunnel syndrome, left upper limb: Secondary | ICD-10-CM | POA: Diagnosis not present

## 2014-05-14 DIAGNOSIS — Z4789 Encounter for other orthopedic aftercare: Secondary | ICD-10-CM | POA: Diagnosis not present

## 2014-05-14 DIAGNOSIS — M67431 Ganglion, right wrist: Secondary | ICD-10-CM | POA: Diagnosis not present

## 2014-05-16 ENCOUNTER — Other Ambulatory Visit: Payer: Self-pay | Admitting: Internal Medicine

## 2014-05-21 DIAGNOSIS — M25532 Pain in left wrist: Secondary | ICD-10-CM | POA: Diagnosis not present

## 2014-05-22 ENCOUNTER — Ambulatory Visit (INDEPENDENT_AMBULATORY_CARE_PROVIDER_SITE_OTHER): Payer: Medicare Other | Admitting: Internal Medicine

## 2014-05-22 ENCOUNTER — Encounter: Payer: Self-pay | Admitting: Internal Medicine

## 2014-05-22 ENCOUNTER — Other Ambulatory Visit (INDEPENDENT_AMBULATORY_CARE_PROVIDER_SITE_OTHER): Payer: Medicare Other

## 2014-05-22 VITALS — BP 130/64 | HR 79 | Temp 98.2°F | Resp 14 | Ht 63.0 in | Wt 240.0 lb

## 2014-05-22 DIAGNOSIS — E1165 Type 2 diabetes mellitus with hyperglycemia: Secondary | ICD-10-CM

## 2014-05-22 DIAGNOSIS — J309 Allergic rhinitis, unspecified: Secondary | ICD-10-CM

## 2014-05-22 DIAGNOSIS — IMO0002 Reserved for concepts with insufficient information to code with codable children: Secondary | ICD-10-CM

## 2014-05-22 DIAGNOSIS — J4520 Mild intermittent asthma, uncomplicated: Secondary | ICD-10-CM | POA: Diagnosis not present

## 2014-05-22 DIAGNOSIS — I10 Essential (primary) hypertension: Secondary | ICD-10-CM

## 2014-05-22 DIAGNOSIS — Z85038 Personal history of other malignant neoplasm of large intestine: Secondary | ICD-10-CM

## 2014-05-22 DIAGNOSIS — M1710 Unilateral primary osteoarthritis, unspecified knee: Secondary | ICD-10-CM

## 2014-05-22 DIAGNOSIS — Z23 Encounter for immunization: Secondary | ICD-10-CM | POA: Diagnosis not present

## 2014-05-22 DIAGNOSIS — E119 Type 2 diabetes mellitus without complications: Secondary | ICD-10-CM

## 2014-05-22 DIAGNOSIS — M179 Osteoarthritis of knee, unspecified: Secondary | ICD-10-CM

## 2014-05-22 LAB — COMPREHENSIVE METABOLIC PANEL
ALT: 24 U/L (ref 0–35)
AST: 23 U/L (ref 0–37)
Albumin: 4.2 g/dL (ref 3.5–5.2)
Alkaline Phosphatase: 100 U/L (ref 39–117)
BUN: 13 mg/dL (ref 6–23)
CO2: 32 mEq/L (ref 19–32)
CREATININE: 0.87 mg/dL (ref 0.40–1.20)
Calcium: 9.7 mg/dL (ref 8.4–10.5)
Chloride: 102 mEq/L (ref 96–112)
GFR: 68 mL/min (ref >60.00)
Glucose, Bld: 210 mg/dL — ABNORMAL HIGH (ref 70–99)
Potassium: 4.5 mEq/L (ref 3.5–5.1)
SODIUM: 137 meq/L (ref 135–145)
Total Bilirubin: 0.5 mg/dL (ref 0.2–1.2)
Total Protein: 7.1 g/dL (ref 6.0–8.3)

## 2014-05-22 LAB — LIPID PANEL
Cholesterol: 219 mg/dL — ABNORMAL HIGH (ref 0–200)
HDL: 52.2 mg/dL (ref >39.00)
NONHDL: 166.8
TRIGLYCERIDES: 281 mg/dL — AB (ref 0.0–149.0)
Total CHOL/HDL Ratio: 4
VLDL: 56.2 mg/dL — ABNORMAL HIGH (ref 0.0–40.0)

## 2014-05-22 LAB — CBC
HEMATOCRIT: 45 % (ref 36.0–46.0)
HEMOGLOBIN: 15.3 g/dL — AB (ref 12.0–15.0)
MCHC: 33.9 g/dL (ref 30.0–36.0)
MCV: 85.4 fl (ref 78.0–100.0)
Platelets: 287 10*3/uL (ref 150.0–400.0)
RBC: 5.26 Mil/uL — ABNORMAL HIGH (ref 3.87–5.11)
RDW: 14 % (ref 11.5–15.5)
WBC: 10.1 10*3/uL (ref 4.0–10.5)

## 2014-05-22 LAB — MICROALBUMIN / CREATININE URINE RATIO
CREATININE, U: 253.8 mg/dL
MICROALB/CREAT RATIO: 1.9 mg/g (ref 0.0–30.0)
Microalb, Ur: 4.9 mg/dL — ABNORMAL HIGH (ref 0.0–1.9)

## 2014-05-22 LAB — HEMOGLOBIN A1C: Hgb A1c MFr Bld: 7.5 % — ABNORMAL HIGH (ref 4.6–6.5)

## 2014-05-22 LAB — LDL CHOLESTEROL, DIRECT: LDL DIRECT: 133 mg/dL

## 2014-05-22 NOTE — Patient Instructions (Signed)
We will check on all the blood work today for the diabetes as well as the urine to check for signs of diabetes in the kidneys.   Keep working on exercising when you can and using diet to lose a little weight. This will help the joints and the diabetes.   We will call you back with the results.   Diabetes and Exercise Exercising regularly is important. It is not just about losing weight. It has many health benefits, such as:  Improving your overall fitness, flexibility, and endurance.  Increasing your bone density.  Helping with weight control.  Decreasing your body fat.  Increasing your muscle strength.  Reducing stress and tension.  Improving your overall health. People with diabetes who exercise gain additional benefits because exercise:  Reduces appetite.  Improves the body's use of blood sugar (glucose).  Helps lower or control blood glucose.  Decreases blood pressure.  Helps control blood lipids (such as cholesterol and triglycerides).  Improves the body's use of the hormone insulin by:  Increasing the body's insulin sensitivity.  Reducing the body's insulin needs.  Decreases the risk for heart disease because exercising:  Lowers cholesterol and triglycerides levels.  Increases the levels of good cholesterol (such as high-density lipoproteins [HDL]) in the body.  Lowers blood glucose levels. YOUR ACTIVITY PLAN  Choose an activity that you enjoy and set realistic goals. Your health care provider or diabetes educator can help you make an activity plan that works for you. Exercise regularly as directed by your health care provider. This includes:  Performing resistance training twice a week such as push-ups, sit-ups, lifting weights, or using resistance bands.  Performing 150 minutes of cardio exercises each week such as walking, running, or playing sports.  Staying active and spending no more than 90 minutes at one time being inactive. Even short bursts of  exercise are good for you. Three 10-minute sessions spread throughout the day are just as beneficial as a single 30-minute session. Some exercise ideas include:  Taking the dog for a walk.  Taking the stairs instead of the elevator.  Dancing to your favorite song.  Doing an exercise video.  Doing your favorite exercise with a friend. RECOMMENDATIONS FOR EXERCISING WITH TYPE 1 OR TYPE 2 DIABETES   Check your blood glucose before exercising. If blood glucose levels are greater than 240 mg/dL, check for urine ketones. Do not exercise if ketones are present.  Avoid injecting insulin into areas of the body that are going to be exercised. For example, avoid injecting insulin into:  The arms when playing tennis.  The legs when jogging.  Keep a record of:  Food intake before and after you exercise.  Expected peak times of insulin action.  Blood glucose levels before and after you exercise.  The type and amount of exercise you have done.  Review your records with your health care provider. Your health care provider will help you to develop guidelines for adjusting food intake and insulin amounts before and after exercising.  If you take insulin or oral hypoglycemic agents, watch for signs and symptoms of hypoglycemia. They include:  Dizziness.  Shaking.  Sweating.  Chills.  Confusion.  Drink plenty of water while you exercise to prevent dehydration or heat stroke. Body water is lost during exercise and must be replaced.  Talk to your health care provider before starting an exercise program to make sure it is safe for you. Remember, almost any type of activity is better than none. Document Released:  05/01/2003 Document Revised: 06/25/2013 Document Reviewed: 07/18/2012 [facility] Patient Information 2015 [place], [place]. This information is not intended to replace advice given to you by your health care provider. Make sure you discuss any questions you have with your health care  provider.

## 2014-05-22 NOTE — Progress Notes (Signed)
Pre visit review using our clinic review tool, if applicable. No additional management support is needed unless otherwise documented below in the visit note. 

## 2014-05-23 ENCOUNTER — Encounter: Payer: Self-pay | Admitting: Internal Medicine

## 2014-05-23 NOTE — Assessment & Plan Note (Signed)
Would appear to be mild intermittent. Does not have controller medicine and uses albuterol only when having severe allergies. No flare or steroids in years.

## 2014-05-23 NOTE — Assessment & Plan Note (Signed)
Unclear if she has complications. Will check HgA1c, microalbumin to creatinine ratio, BMP today. Foot exam done. May need change to her regimen as last know HgA1c we have is 8.4 which is not well controlled. No hypoglycemia, not clear if she has ever tried metformin.

## 2014-05-23 NOTE — Assessment & Plan Note (Signed)
BP well controlled on her lasix, valsartan, diltiazem. Checking labs today and will continue regimen unless indication for change.

## 2014-05-23 NOTE — Progress Notes (Signed)
   Subjective:    Patient ID: [name], female    DOB: [birth year], 74 y.o.   MRN: [id number]  HPI The patient is a new patient who is coming in to check on her diabetes. She had been a patient here and then went to another office but wanted to return. She has had type 2 diabetes for some time and thinks that it is reasonably controlled but does not feel that the other doctor did a good job with it. She takes glimiperide and denies hypoglycemia. She is not sure if she has been on anything else. Denies numbness in her feet or kidney problems. Overall feels her health is good.   PMH, [facility], social history reviewed and updated.   Review of Systems  Constitutional: Negative for fever, activity change, appetite change, fatigue and unexpected weight change.  Respiratory: Negative for cough, chest tightness, shortness of breath and wheezing.   Cardiovascular: Negative for chest pain, palpitations and leg swelling.  Gastrointestinal: Negative for nausea, abdominal pain, diarrhea, constipation and abdominal distention.  Musculoskeletal: Negative.   Skin: Negative.   Neurological: Negative.   Psychiatric/Behavioral: Negative.       Objective:   Physical Exam  Constitutional: She is oriented to person, place, and time. She appears well-developed and well-nourished.  HENT:  Head: Normocephalic and atraumatic.  Eyes: EOM are normal.  Neck: Normal range of motion.  Cardiovascular: Normal rate and regular rhythm.   Pulmonary/Chest: Effort normal and breath sounds normal. No respiratory distress. She has no wheezes. She has no rales.  Abdominal: Soft. Bowel sounds are normal. She exhibits no distension. There is no tenderness.  Neurological: She is alert and oriented to person, place, and time. Coordination normal.  Skin: Skin is warm and dry.   Filed Vitals:   05/22/14 1102  BP: 130/64  Pulse: 79  Temp: 98.2 F (36.8 C)  TempSrc: Oral  Resp: 14  Height: 5\' 3"  (1.6 m)  Weight: 240 lb  (108.863 kg)  SpO2: 95%      Assessment & Plan:  Tdap given at visit.

## 2014-05-23 NOTE — Assessment & Plan Note (Signed)
Taking oxycodone from the orthopedic and appears to be getting #40 per month per review from [facility] narcotic database. Sole prescriber and only 1 pharmacy.

## 2014-05-23 NOTE — Assessment & Plan Note (Signed)
She does well with this overall. Occasional flares and uses albuterol for her breathing.

## 2014-05-23 NOTE — Assessment & Plan Note (Signed)
Due for repeat colonoscopy in 2019. No known recurrence or symptoms to suggest such.

## 2014-05-28 ENCOUNTER — Telehealth: Payer: Self-pay | Admitting: *Deleted

## 2014-05-28 DIAGNOSIS — Z4789 Encounter for other orthopedic aftercare: Secondary | ICD-10-CM | POA: Diagnosis not present

## 2014-05-28 DIAGNOSIS — M67431 Ganglion, right wrist: Secondary | ICD-10-CM | POA: Diagnosis not present

## 2014-05-28 DIAGNOSIS — G5602 Carpal tunnel syndrome, left upper limb: Secondary | ICD-10-CM | POA: Diagnosis not present

## 2014-05-28 NOTE — Telephone Encounter (Signed)
Spoke with pt's husband and instructed that she has not been checked since September 10th 2015 and needs to have her INR checked . He states he will let her know and have her call regarding this

## 2014-06-01 ENCOUNTER — Emergency Department (HOSPITAL_COMMUNITY)
Admission: EM | Admit: 2014-06-01 | Discharge: 2014-06-01 | Disposition: A | Discharge status: Home / Self Care | Payer: Medicare Other | Attending: Emergency Medicine | Admitting: Emergency Medicine

## 2014-06-01 ENCOUNTER — Encounter (HOSPITAL_COMMUNITY): Payer: Self-pay | Admitting: *Deleted

## 2014-06-01 DIAGNOSIS — Z86010 Personal history of colonic polyps: Secondary | ICD-10-CM | POA: Insufficient documentation

## 2014-06-01 DIAGNOSIS — Z79899 Other long term (current) drug therapy: Secondary | ICD-10-CM | POA: Insufficient documentation

## 2014-06-01 DIAGNOSIS — Z8711 Personal history of peptic ulcer disease: Secondary | ICD-10-CM | POA: Diagnosis not present

## 2014-06-01 DIAGNOSIS — Z7951 Long term (current) use of inhaled steroids: Secondary | ICD-10-CM | POA: Insufficient documentation

## 2014-06-01 DIAGNOSIS — Z8719 Personal history of other diseases of the digestive system: Secondary | ICD-10-CM | POA: Diagnosis not present

## 2014-06-01 DIAGNOSIS — Z8709 Personal history of other diseases of the respiratory system: Secondary | ICD-10-CM | POA: Insufficient documentation

## 2014-06-01 DIAGNOSIS — I499 Cardiac arrhythmia, unspecified: Secondary | ICD-10-CM | POA: Diagnosis not present

## 2014-06-01 DIAGNOSIS — I10 Essential (primary) hypertension: Secondary | ICD-10-CM | POA: Insufficient documentation

## 2014-06-01 DIAGNOSIS — G8929 Other chronic pain: Secondary | ICD-10-CM | POA: Diagnosis not present

## 2014-06-01 DIAGNOSIS — Z87891 Personal history of nicotine dependence: Secondary | ICD-10-CM | POA: Diagnosis not present

## 2014-06-01 DIAGNOSIS — Z7901 Long term (current) use of anticoagulants: Secondary | ICD-10-CM | POA: Insufficient documentation

## 2014-06-01 DIAGNOSIS — I4891 Unspecified atrial fibrillation: Secondary | ICD-10-CM | POA: Insufficient documentation

## 2014-06-01 DIAGNOSIS — R000 Tachycardia, unspecified: Secondary | ICD-10-CM | POA: Diagnosis present

## 2014-06-01 DIAGNOSIS — Z8619 Personal history of other infectious and parasitic diseases: Secondary | ICD-10-CM | POA: Diagnosis not present

## 2014-06-01 DIAGNOSIS — F419 Anxiety disorder, unspecified: Secondary | ICD-10-CM | POA: Insufficient documentation

## 2014-06-01 DIAGNOSIS — Z8639 Personal history of other endocrine, nutritional and metabolic disease: Secondary | ICD-10-CM | POA: Diagnosis not present

## 2014-06-01 DIAGNOSIS — Z800 Family history of malignant neoplasm of digestive organs: Secondary | ICD-10-CM | POA: Diagnosis not present

## 2014-06-01 DIAGNOSIS — J45901 Unspecified asthma with (acute) exacerbation: Secondary | ICD-10-CM | POA: Diagnosis not present

## 2014-06-01 DIAGNOSIS — Z791 Long term (current) use of non-steroidal anti-inflammatories (NSAID): Secondary | ICD-10-CM | POA: Diagnosis not present

## 2014-06-01 DIAGNOSIS — Z85038 Personal history of other malignant neoplasm of large intestine: Secondary | ICD-10-CM | POA: Diagnosis not present

## 2014-06-01 LAB — URINALYSIS, ROUTINE W REFLEX MICROSCOPIC
Bilirubin Urine: NEGATIVE
Glucose, UA: NEGATIVE mg/dL
Hgb urine dipstick: NEGATIVE
Ketones, ur: NEGATIVE mg/dL
Nitrite: NEGATIVE
PROTEIN: NEGATIVE mg/dL
Specific Gravity, Urine: 1.004 — ABNORMAL LOW (ref 1.005–1.030)
Urobilinogen, UA: 0.2 mg/dL (ref 0.0–1.0)
pH: 6.5 (ref 5.0–8.0)

## 2014-06-01 LAB — BASIC METABOLIC PANEL
ANION GAP: 10 (ref 5–15)
BUN: 8 mg/dL (ref 6–23)
CO2: 24 mmol/L (ref 19–32)
Calcium: 9.2 mg/dL (ref 8.4–10.5)
Chloride: 105 mmol/L (ref 96–112)
Creatinine, Ser: 0.75 mg/dL (ref 0.50–1.10)
GFR calc non Af Amer: 83 mL/min — ABNORMAL LOW (ref >90)
GLUCOSE: 177 mg/dL — AB (ref 70–99)
Potassium: 3.3 mmol/L — ABNORMAL LOW (ref 3.5–5.1)
SODIUM: 139 mmol/L (ref 135–145)

## 2014-06-01 LAB — CBC
HCT: 47 % — ABNORMAL HIGH (ref 36.0–46.0)
Hemoglobin: 16 g/dL — ABNORMAL HIGH (ref 12.0–15.0)
MCH: 29.4 pg (ref 26.0–34.0)
MCHC: 34 g/dL (ref 30.0–36.0)
MCV: 86.2 fL (ref 78.0–100.0)
Platelets: 258 10*3/uL (ref 150–400)
RBC: 5.45 MIL/uL — ABNORMAL HIGH (ref 3.87–5.11)
RDW: 14 % (ref 11.5–15.5)
WBC: 15.3 10*3/uL — ABNORMAL HIGH (ref 4.0–10.5)

## 2014-06-01 LAB — URINE MICROSCOPIC-ADD ON

## 2014-06-01 MED ORDER — MIDAZOLAM HCL 2 MG/2ML IJ SOLN
4.0000 mg | Freq: Once | INTRAMUSCULAR | Status: AC
  Administered 2014-06-01: 4 mg via INTRAVENOUS
  Filled 2014-06-01: qty 4

## 2014-06-01 MED ORDER — SODIUM CHLORIDE 0.9 % IV BOLUS (SEPSIS)
500.0000 mL | Freq: Once | INTRAVENOUS | Status: AC
  Administered 2014-06-01: 500 mL via INTRAVENOUS

## 2014-06-01 MED ORDER — FENTANYL CITRATE 0.05 MG/ML IJ SOLN
50.0000 ug | Freq: Once | INTRAMUSCULAR | Status: AC
  Administered 2014-06-01: 50 ug via INTRAVENOUS
  Filled 2014-06-01: qty 2

## 2014-06-01 MED ORDER — ACETAMINOPHEN 325 MG PO TABS
650.0000 mg | ORAL_TABLET | Freq: Once | ORAL | Status: AC
  Administered 2014-06-01: 650 mg via ORAL
  Filled 2014-06-01: qty 2

## 2014-06-01 MED ORDER — PROPOFOL 10 MG/ML IV BOLUS
INTRAVENOUS | Status: AC | PRN
  Administered 2014-06-01: 40 mg via INTRAVENOUS

## 2014-06-01 MED ORDER — APIXABAN 5 MG PO TABS: 5.0000 mg | ORAL_TABLET | Freq: Two times a day (BID) | ORAL | Status: DC

## 2014-06-01 MED ORDER — PROPOFOL 10 MG/ML IV BOLUS
INTRAVENOUS | Status: AC
  Filled 2014-06-01: qty 20

## 2014-06-01 MED ORDER — APIXABAN 5 MG PO TABS
5.0000 mg | ORAL_TABLET | Freq: Two times a day (BID) | ORAL | Status: DC
  Administered 2014-06-01: 5 mg via ORAL
  Filled 2014-06-01: qty 1

## 2014-06-01 NOTE — ED Notes (Signed)
The pt is c/o a rapid heart rate since approx 0200am today with some sob.  No chest poain.  Ems  Did iv.  Pt arrived on a cardizem drip.  2 10mg  bolus given by ems and a 5mg  drip continues

## 2014-06-01 NOTE — Discharge Instructions (Signed)
Please read and follow all provided instructions.  Your diagnoses today include:  1. Atrial fibrillation with RVR    Tests performed today include:  An EKG of your heart  Blood counts and electrolytes  Urine test   Vital signs. See below for your results today.   Medications prescribed:   Eliquis - medication that thins blood and protects you from having a stroke  Take any prescribed medications only as directed.  Follow-up instructions: Please follow-up with your primary care provider as soon as you can for further evaluation of your symptoms.   Return instructions:  SEEK IMMEDIATE MEDICAL ATTENTION IF:  You have severe chest pain, especially if the pain is crushing or pressure-like and spreads to the arms, back, neck, or jaw, or if you have sweating, nausea (feeling sick to your stomach), or shortness of breath. THIS IS AN EMERGENCY. Don't wait to see if the pain will go away. Get medical help at once. Call 911 or 0 (operator). DO NOT drive yourself to the hospital.   Your chest pain gets worse and does not go away with rest.   You have an attack of chest pain lasting longer than usual, despite rest and treatment with the medications your caregiver has prescribed.   You wake from sleep with chest pain or shortness of breath.  You feel dizzy or faint.  You have chest pain not typical of your usual pain for which you originally saw your caregiver.   You have any other emergent concerns regarding your health.   Your vital signs today were: BP 122/68 mmHg   Pulse 76   Temp(Src) 98.1 F (36.7 C) (Oral)   Resp 16   SpO2 94%   LMP 12/30/2000 If your blood pressure (BP) was elevated above 135/85 this visit, please have this repeated by your doctor within one month. --------------

## 2014-06-01 NOTE — ED Provider Notes (Signed)
CSN: [id number]     Arrival date & time 06/01/14  0612 History   First MD Initiated Contact with Patient 06/01/14 [phone]     Chief Complaint  Patient presents with  . Tachycardia     (Consider location/radiation/quality/duration/timing/severity/associated sxs/prior Treatment) HPI Comments: Patient with h/o paroxysmal atrial fibrillation who has not been on anticoagulation since 10/2013 -- presents with tachycardia and palpitations she states started at 0421. Patient took 324 mg of aspirin and her daily dose of Cardizem after the symptoms began without relief. Patient was having trouble sleeping due to pain in her wrist 2/2 gout and tendinitis. Patient is currently experiencing mild shortness of breath but she denies chest pain. No lightheadedness or syncope. No other symptoms such as fever, nausea, vomiting, diarrhea, urinary symptoms. Cardizem bolus 10mg  x 2 given by EMS, now on drip started by EMS. The onset of this condition was acute. The course is constant. Aggravating factors: none. Alleviating factors: none.    The history is provided by the patient.    Past Medical History  Diagnosis Date  . Allergic rhinitis   . Asthma   . Gout     takes Colchicine prn  . Peptic ulcer disease   . History of PSVT (paroxysmal supraventricular tachycardia)   . HLD (hyperlipidemia)     pt doesn't take any medication for this  . DJD (degenerative joint disease)   . History of blood clots     right lung in early 90's  . Sleep apnea     doesn't use CPAP  . Shortness of breath     pt states that she can be sitting as well as exertion and get short of breath  . Bronchitis     in Jan 2013-uses Albuterol prn  . History of migraine headaches     last migraine 63yrs ago;Pt states she does get sinus headaches  . Osteoarthritis   . Chronic back pain     hx buldging disc  . Rash     on neck and started after she started taking her beta blocker 69yrs ago;medical MD has seen this  . Gastric ulcer   .  Hemorrhoids   . Family hx of colon cancer   . Diabetes mellitus     glimepiride  . Yeast infection of the vagina     being treated for this now by dr.fontaine  . Bronchitis, acute 03-01-11    FINSHED Z-PAK-STILL ON PREDNISONE NOW  . Peripheral edema     takes Furosemide every other day  . Dizziness     occasionally  . Constipation     takes Colace prn  . Colon cancer 2007    s/p surgery and chemo  . Urinary frequency   . Nocturia   . Anxiety     and panic attacks;pt states that she is claustrophobic  . Family history of anesthesia complication     daughter gets PONV  . Hypertension     takes Micardis and Diltiazem daily  . Atrial fibrillation     takes Coumadin daily-instructed to stop  . Spinal headache   . Numbness     left hand  . Fibromyalgia     but doesn't take any meds  . GERD (gastroesophageal reflux disease)     uses Tums prn  . History of colon polyps   . Urinary frequency   . Peripheral edema     takes Lasix daily  . History of shingles    Past Surgical History  Procedure Laterality Date  . Caesarean section  1966/69/72    x 3  . Heel spur surgery  1992    x 2  . Ankle surgery      tumor-benign removed left ankle  . Lumbar disc surgery  2008  . Pyelonidal cystectomy      at age 15  . Knee arthrosocpy      bil;couple of years before knee replacement  . Ganglion cyst excision  > 108yrs ago    removed from left pointer finger  . Cataract surgery  2010  . Port a cath placed  2008  . Port a cath removed  2008  . Knee surgery      left TKA  . Anterior cervical decomp/discectomy fusion  01/12/2011    Procedure: ANTERIOR CERVICAL DECOMPRESSION/DISCECTOMY FUSION 2 LEVELS;  Surgeon: Cooper Render Pool;  Location: [facility];  Service: Neurosurgery;  Laterality: Bilateral;  Cervical five-six, six-seven anterior cervical discectomy and fusion with allograft and plating  . Hysteroscopy w/d&c  02/19/2011    Procedure: DILATATION AND CURETTAGE /HYSTEROSCOPY;   Surgeon: Anastasio Auerbach, MD;  Location: [facility];  Service: Gynecology;  Laterality: N/A;  requests one hour  . Dilation and curettage of uterus  02-19-11    & POLYP REMOVAL  . Anterior cervical decomp/discectomy fusion  06/11/2011    Procedure: ANTERIOR CERVICAL DECOMPRESSION/DISCECTOMY FUSION 1 LEVEL/HARDWARE REMOVAL;  Surgeon: Charlie Pitter, MD;  Location: [facility];  Service: Neurosurgery;  Laterality: N/A;  Cervical four-five anterior cervical decompression fusion with allograft and plating; Removal of Cervical five to seven plate  . Joint replacement Left     knee  . Colectomy  2007    colon cancer  . Cardiac catheterization  90's and 2005  . Tubal ligation  1972  . Colonoscopy    . Bilateral cataracts removed    . Shoulder arthroscopy with subacromial decompression and open rotator c Right 03/16/2013    Procedure: RIGHT SHOULDER ARTHROSCOPY WITH SUBACROMIAL DECOMPRESSION AND OPEN ROTATOR CUFF REPAIR, OPEN DISTAL CLAVICLE  RESECTION POSSIBLE BICEP TENODESIS ;  Surgeon: Augustin Schooling, MD;  Location: [facility];  Service: Orthopedics;  Laterality: Right;   Family History  Problem Relation Age of Onset  . Coronary artery disease Other   . Heart attack Mother   . Stroke Mother   . Hypertension Mother   . Heart disease Father   . Hypertension Sister   . Cancer Sister     left kidney cancer  . Cancer Brother     bladder  . Cancer Brother     liver, spread to colon  . Liver cancer Brother   . Anesthesia problems Neg Hx   . Hypotension Neg Hx   . Malignant hyperthermia Neg Hx   . Pseudochol deficiency Neg Hx    History  Substance Use Topics  . Smoking status: Former Smoker -- 1.50 packs/day for 30 years    Types: Cigarettes  . Smokeless tobacco: Never Used     Comment: quit smoking 34yrs ago  . Alcohol Use: No   OB History    Gravida Para Term Preterm AB TAB SAB Ectopic Multiple Living   3 3        3      Review of Systems  Constitutional: Negative for fever and  diaphoresis.  Eyes: Negative for redness.  Respiratory: Positive for shortness of breath. Negative for cough.   Cardiovascular: Positive for palpitations. Negative for chest pain and leg swelling.  Gastrointestinal:  Negative for nausea, vomiting and abdominal pain.  Genitourinary: Negative for dysuria.  Musculoskeletal: Positive for arthralgias. Negative for back pain and neck pain.  Skin: Negative for rash.  Neurological: Negative for syncope and light-headedness.  Psychiatric/Behavioral: The patient is not nervous/anxious.       Allergies  Benazepril; Adhesive; Morphine; Nifedipine; and Piroxicam  Home Medications   Prior to Admission medications   Medication Sig Start Date End Date Taking? Authorizing Provider  albuterol (PROVENTIL HFA;VENTOLIN HFA) 108 (90 BASE) MCG/ACT inhaler Inhale 2 puffs into the lungs every 6 (six) hours as needed for wheezing or shortness of breath.    Historical Provider, MD  ALPRAZolam Duanne Moron) 0.5 MG tablet Take 1 tablet (0.5 mg total) by mouth 3 (three) times daily as needed for sleep or anxiety. Take 1 tab every 6hrs prn 06/28/11   Janith Lima, MD  diltiazem (CARDIZEM CD) 240 MG 24 hr capsule TAKE 1 CAPSULE BY MOUTH ONCE DAILY. 04/17/14   Fay Records, MD  fluticasone (FLONASE) 50 MCG/ACT nasal spray Place 2 sprays into both nostrils daily. 01/29/13   Neena Rhymes, MD  furosemide (LASIX) 40 MG tablet Take 40 mg by mouth daily.    Historical Provider, MD  glimepiride (AMARYL) 4 MG tablet Take 1 tablet (4 mg total) by mouth daily with breakfast. 07/02/13   Neena Rhymes, MD  oxyCODONE (OXY IR/ROXICODONE) 5 MG immediate release tablet Take 5 mg by mouth every 6 (six) hours as needed.  04/26/14   Historical Provider, MD  promethazine (PHENERGAN) 25 MG tablet Take 1 tablet (25 mg total) by mouth every 6 (six) hours as needed. Only takes for severe nausea 05/06/11   Neena Rhymes, MD  valsartan (DIOVAN) 160 MG tablet Take 160 mg by mouth daily. 05/02/14    Historical Provider, MD  warfarin (COUMADIN) 5 MG tablet Take 1 tablet by mouth daily or as directed by coumadin clinic Patient not taking: Reported on 05/22/2014 07/17/13   Fay Records, MD   BP 95/66 mmHg  Pulse 157  Temp(Src) 98.1 F (36.7 C) (Oral)  Resp 12  SpO2 95%  LMP 12/30/2000   Physical Exam  Constitutional: She appears well-developed and well-nourished.  HENT:  Head: Normocephalic and atraumatic.  Mouth/Throat: Mucous membranes are normal. Mucous membranes are not dry.  Eyes: Conjunctivae are normal.  Neck: Trachea normal and normal range of motion. Neck supple. Normal carotid pulses and no JVD present. No muscular tenderness present. Carotid bruit is not present. No tracheal deviation present.  Cardiovascular: S1 normal, S2 normal, normal heart sounds and intact distal pulses.  An irregularly irregular rhythm present. Tachycardia present.  Exam reveals no decreased pulses.   No murmur heard. Pulmonary/Chest: Effort normal. No respiratory distress. She has no wheezes. She exhibits no tenderness.  Abdominal: Soft. Normal aorta and bowel sounds are normal. There is no tenderness. There is no rebound and no guarding.  Musculoskeletal: Normal range of motion.  Wearing L wrist brace  Neurological: She is alert.  Skin: Skin is warm and dry. She is not diaphoretic. No cyanosis. No pallor.  Psychiatric: She has a normal mood and affect.  Nursing note and vitals reviewed.   ED Course  Procedures (including critical care time) Labs Review Labs Reviewed  CBC - Abnormal; Notable for the following:    WBC 15.3 (*)    RBC 5.45 (*)    Hemoglobin 16.0 (*)    HCT 47.0 (*)    All other components within normal limits  BASIC METABOLIC PANEL - Abnormal; Notable for the following:    Potassium 3.3 (*)    Glucose, Bld 177 (*)    GFR calc non Af Amer 83 (*)    All other components within normal limits  URINALYSIS, ROUTINE W REFLEX MICROSCOPIC - Abnormal; Notable for the following:     Specific Gravity, Urine 1.004 (*)    Leukocytes, UA SMALL (*)    All other components within normal limits  URINE MICROSCOPIC-ADD ON - Abnormal; Notable for the following:    Squamous Epithelial / LPF MANY (*)    All other components within normal limits    Imaging Review No results found.   6:22 AM Patient seen and examined. Work-up initiated. Discussed with Dr. Lita Mains. Pt seen with Dr. Lita Mains.   Vital signs reviewed and are as follows: BP 95/66 mmHg  Pulse 157  Temp(Src) 98.1 F (36.7 C) (Oral)  Resp 12  SpO2 95%  LMP 12/30/2000  ED ECG REPORT   Date: 06/01/2014  Rate: 154  Rhythm: atrial fibrillation with rapid ventricular response  QRS Axis: left  Intervals: normal  ST/T Wave abnormalities: normal  Conduction Disutrbances:none  Narrative Interpretation:   Old EKG Reviewed: changes noted, now in afib with RVR  I have personally reviewed the EKG tracing and agree with the computerized printout as noted.   BP 102/72 mmHg  Pulse 157  Temp(Src) 98.1 F (36.7 C) (Oral)  Resp 12  SpO2 95%  LMP 12/30/2000  CHADS2-VASc > 2.   7:11 AM Pt stable. Cardioversion discussed with patient and she elects to proceed. Risks and benefits of procedure and sedation discussed with patient.   7:32 AM Cardioversion performed after sedation. Patient in NSR. Will speak with cardiology.   ED ECG REPORT   Date: 06/01/2014  Rate: 83  Rhythm: normal sinus rhythm  QRS Axis: left  Intervals: normal  ST/T Wave abnormalities: normal  Conduction Disutrbances:none  Narrative Interpretation:   Old EKG Reviewed: changes noted, now NSR  I have personally reviewed the EKG tracing and agree with the computerized printout as noted.   8:31 AM Spoke with cardiology. They are okay with discharge to home. Recc starting Eliquis. Pt and family agreeable. Discussed risks and benefits of anticoagulation.   Pt awake. Only complaint is spasm pain intermittently left chest where pad was.    Patient urged to return with worsening symptoms or other concerns. Patient verbalized understanding and agrees with plan.   CRITICAL CARE Performed by: Faustino Congress Total critical care time: 45 Critical care time was exclusive of separately billable procedures and treating other patients. Critical care was necessary to treat or prevent imminent or life-threatening deterioration. Critical care was time spent personally by me on the following activities: development of treatment plan with patient and/or surrogate as well as nursing, discussions with consultants, evaluation of patient's response to treatment, examination of patient, obtaining history from patient or surrogate, ordering and performing treatments and interventions, ordering and review of laboratory studies, ordering and review of radiographic studies, pulse oximetry and re-evaluation of patient's condition.  MDM   Final diagnoses:  Atrial fibrillation with RVR   Patient with primary afib with RVR, started approx 2 hrs PTA. She was DC cardioverted. Maintained NSR in ED. Spoke with cardiology who will arrange follow-up. Patient started on Eliquis. Stable for discharge.     Carlisle Cater, PA-C 06/01/14 [id number]  Julianne Rice, MD 06/01/14 [phone]

## 2014-06-01 NOTE — ED Notes (Signed)
Pt. s daughter reports that pt. Is feeling twinges of "feeling like she is being shocked."  Reported to Alecia Lemming, [place]

## 2014-06-01 NOTE — ED Provider Notes (Signed)
CARDIOVERSION Date/Time: 06/01/2014 7:30 AM Performed by: Julianne Rice Authorized by: Julianne Rice Consent: Verbal consent obtained. Written consent obtained. Risks and benefits: risks, benefits and alternatives were discussed Consent given by: patient Patient understanding: patient states understanding of the procedure being performed Patient consent: the patient's understanding of the procedure matches consent given Procedure consent: procedure consent matches procedure scheduled Relevant documents: relevant documents present and verified Patient identity confirmed: verbally with patient and arm band Time out: Immediately prior to procedure a "time out" was called to verify the correct patient, procedure, equipment, support staff and site/side marked as required. Patient sedated: yes Sedatives: fentanyl, midazolam, propofol and see MAR for details Sedation start date/time: 06/01/2014 7:05 AM Sedation end date/time: 06/01/2014 7:32 AM Vitals: Vital signs were monitored during sedation. Cardioversion basis: elective Pre-procedure rhythm: atrial fibrillation Patient position: patient was placed in a supine position Chest area: chest area exposed Electrodes: pads Electrodes placed: anterior-posterior Number of attempts: 1 Attempt 1 mode: asynchronous Attempt 1 waveform: biphasic Attempt 1 shock (in Joules): 200 Attempt 1 outcome: conversion to normal sinus rhythm Post-procedure rhythm: normal sinus rhythm Complications: no complications Patient tolerance: Patient tolerated the procedure well with no immediate complications     Julianne Rice, MD 06/01/14 [id number]

## 2014-06-01 NOTE — ED Notes (Signed)
The pt has pain and swelling in her lt hand and wrist.  She has gout in her lt hand

## 2014-06-10 ENCOUNTER — Telehealth: Payer: Self-pay | Admitting: Internal Medicine

## 2014-06-10 NOTE — Telephone Encounter (Signed)
Patient was working outside in yard yesterday and she now has poison oak or ivy. It is on arms and also on her face and near her eyes. Please call patient. She is worried

## 2014-06-10 NOTE — Telephone Encounter (Signed)
Patient will go to urgent care, if not she will call and make an appt to be seen here.

## 2014-06-12 DIAGNOSIS — E119 Type 2 diabetes mellitus without complications: Secondary | ICD-10-CM | POA: Diagnosis not present

## 2014-06-12 DIAGNOSIS — L237 Allergic contact dermatitis due to plants, except food: Secondary | ICD-10-CM | POA: Diagnosis not present

## 2014-06-13 ENCOUNTER — Ambulatory Visit: Payer: Self-pay | Admitting: Internal Medicine

## 2014-06-13 DIAGNOSIS — I4891 Unspecified atrial fibrillation: Secondary | ICD-10-CM

## 2014-06-14 ENCOUNTER — Other Ambulatory Visit: Payer: Self-pay | Admitting: Internal Medicine

## 2014-07-10 ENCOUNTER — Telehealth: Payer: Self-pay | Admitting: Internal Medicine

## 2014-07-10 ENCOUNTER — Other Ambulatory Visit: Payer: Self-pay | Admitting: Internal Medicine

## 2014-07-10 NOTE — Telephone Encounter (Signed)
New Message  Melainie from [facility] calling about Eliquis refill for pt; please call back and dicsuss.

## 2014-07-11 ENCOUNTER — Telehealth: Payer: Self-pay

## 2014-07-11 NOTE — Telephone Encounter (Signed)
She should not be on both eliquis and coumadin.  I hav enot seen her since Aug 2015 She is followed coumadin clinic  Last seen in April   Would have her keep on coumadin  Stop eliquis  F/U in clinic

## 2014-07-12 NOTE — Telephone Encounter (Signed)
We called pt in April to follow up on overdue INR.  At that time, she informed us that she was on Eliquis so visit from April was to document Coumadin had been switched to Eliquis- she was not actually seen.    At her ED visit on 4/9 for Afib with RVR, she was not taking any anticoagulation so the ED MD spoke with cardiology who recommended starting Eliquis, performing DCCV and sending home.  Given reason for switch, would continue with Eliquis rather than change her back to Coumadin.

## 2014-07-12 NOTE — Telephone Encounter (Signed)
lmtcb

## 2014-07-12 NOTE — Telephone Encounter (Signed)
Please clarify if patient taking eliquis or coumadin or both

## 2014-07-15 NOTE — Telephone Encounter (Signed)
lmtcb Debbie Maryem Shuffler RN  

## 2014-07-16 NOTE — Telephone Encounter (Signed)
Make sure she has a f/u appt to discuss / review When is carpal tunnel surgery?

## 2014-07-16 NOTE — Telephone Encounter (Signed)
Lm both on her home phone and mobile number to call us back.  Dr. Harrington Challenger has an opening Thurs 5/26 at 11:30.

## 2014-07-16 NOTE — Telephone Encounter (Signed)
Finally was able to get in touch with pt on her mobile phone. She states she has been out of town and is currently on her way back to town.  States she is not taking coumadin or Eliquis.  States she only took Eliquis a few days after ER visit. States she is going to have to have some additional surgery on her wrist for carpel tunnel. States she can't take coumadin because it makes her feel bad and she can't afford the Eliquis. States she has so many medical problems that she doesn't want to be stopping and starting a blood thinner when she has possible surgery in the future.  Advised her of the risk of stroke due to the AFib by not taking an anticoagulant. She verbalizes understanding but states she "just can't take due to cost" and she feels fine. Advised will forward to Dr. Harrington Challenger for recommendations.

## 2014-07-16 NOTE — Telephone Encounter (Signed)
Spoke w/pt again. States she really is not interested in going on any "blood thinners".  Advised Dr. Harrington Challenger has an opening Thurs 5/26 at 11:30 and she should come in to discuss options.  States there is not a date for surgery for her carpel tunnel because she now wants to get a second opinion. She will come in Thurs 5/26. Advised to bring all of her medications and to be here at 11:15.

## 2014-07-18 ENCOUNTER — Ambulatory Visit (INDEPENDENT_AMBULATORY_CARE_PROVIDER_SITE_OTHER): Payer: Medicare Other | Admitting: Internal Medicine

## 2014-07-18 ENCOUNTER — Encounter: Payer: Self-pay | Admitting: Gastroenterology

## 2014-07-18 ENCOUNTER — Encounter: Payer: Self-pay | Admitting: Internal Medicine

## 2014-07-18 VITALS — BP 148/62 | HR 76 | Ht 63.0 in | Wt 238.4 lb

## 2014-07-18 DIAGNOSIS — I4891 Unspecified atrial fibrillation: Secondary | ICD-10-CM

## 2014-07-18 LAB — CBC WITH DIFFERENTIAL/PLATELET
Basophils Absolute: 0 10*3/uL (ref 0.0–0.1)
Basophils Relative: 0.5 % (ref 0.0–3.0)
EOS ABS: 0.2 10*3/uL (ref 0.0–0.7)
Eosinophils Relative: 2.5 % (ref 0.0–5.0)
HCT: 42.1 % (ref 36.0–46.0)
HEMOGLOBIN: 14.2 g/dL (ref 12.0–15.0)
Lymphocytes Relative: 28.4 % (ref 12.0–46.0)
Lymphs Abs: 2.5 10*3/uL (ref 0.7–4.0)
MCHC: 33.7 g/dL (ref 30.0–36.0)
MCV: 86.1 fl (ref 78.0–100.0)
MONO ABS: 0.5 10*3/uL (ref 0.1–1.0)
MONOS PCT: 5.2 % (ref 3.0–12.0)
NEUTROS PCT: 63.4 % (ref 43.0–77.0)
Neutro Abs: 5.5 10*3/uL (ref 1.4–7.7)
Platelets: 250 10*3/uL (ref 150.0–400.0)
RBC: 4.89 Mil/uL (ref 3.87–5.11)
RDW: 14.8 % (ref 11.5–15.5)
WBC: 8.7 10*3/uL (ref 4.0–10.5)

## 2014-07-18 LAB — BASIC METABOLIC PANEL
BUN: 20 mg/dL (ref 6–23)
CALCIUM: 9.4 mg/dL (ref 8.4–10.5)
CO2: 29 mEq/L (ref 19–32)
Chloride: 102 mEq/L (ref 96–112)
Creatinine, Ser: 1.01 mg/dL (ref 0.40–1.20)
GFR: 57.22 mL/min — AB (ref >60.00)
Glucose, Bld: 270 mg/dL — ABNORMAL HIGH (ref 70–99)
Potassium: 3.8 mEq/L (ref 3.5–5.1)
SODIUM: 136 meq/L (ref 135–145)

## 2014-07-18 NOTE — Patient Instructions (Signed)
Medication Instructions:  Patient does not want to start Eliquis until wrist issues are resolved.  Was offered samples at visit today, declined.   Labwork: bmet/cbc  Testing/Procedures: None  Follow-Up: Your physician recommends that you keep your scheduled  follow-up appointment with Dr. Harrington Challenger on September 8 @ 10:30.   Any Other Special Instructions Will Be Listed Below (If Applicable).

## 2014-07-18 NOTE — Progress Notes (Signed)
Cardiology Office Note   Date:  07/18/2014   ID:  Latia, Mataya May 21, 1940, MRN [id number]  PCP:  Olga Millers, MD  Cardiologist:   Dorris Carnes, MD   No chief complaint on file.     History of Present Illness: DEOLA REWIS is a 74 y.o. female with a history of PAF  She was previously followed by Lowella Dell  I have also seen her in clinic  Also a history of HTN Earlier this spring she woke up feeling her heart racing.  No CP  Went to ER  Found to be in atrial fibrillation  Underwent d/c cardioversion  Was given Rx for eliquis  She has not filled prescription  She refuses to be on coumadin Since seen she denies palpitations.  No dizzinesss. NO SOB  No CP  SHe has not taken lasix in 6 days    Current Outpatient Prescriptions  Medication Sig Dispense Refill  . albuterol (PROVENTIL HFA;VENTOLIN HFA) 108 (90 BASE) MCG/ACT inhaler Inhale 2 puffs into the lungs every 6 (six) hours as needed for wheezing or shortness of breath.    . ALPRAZolam (XANAX) 0.5 MG tablet Take 1 tablet (0.5 mg total) by mouth 3 (three) times daily as needed for sleep or anxiety. Take 1 tab every 6hrs prn 50 tablet 1  . diltiazem (CARDIZEM CD) 240 MG 24 hr capsule TAKE 1 CAPSULE BY MOUTH ONCE DAILY. 30 capsule 0  . diltiazem (CARDIZEM CD) 240 MG 24 hr capsule TAKE 1 CAPSULE BY MOUTH ONCE DAILY. 30 capsule 0  . ELIQUIS 5 MG TABS tablet TAKE 1 TABLET (5 MG TOTAL) BY MOUTH 2 (TWO) TIMES DAILY. 60 tablet 3  . fluticasone (FLONASE) 50 MCG/ACT nasal spray Place 2 sprays into both nostrils daily. 16 g 6  . furosemide (LASIX) 40 MG tablet Take 40 mg by mouth as needed.    [name] glimepiride (AMARYL) 4 MG tablet Take 1 tablet (4 mg total) by mouth daily with breakfast. 30 tablet 5  . loperamide (IMODIUM A-D) 2 MG tablet Take 2 mg by mouth as needed for diarrhea or loose stools.    [name] oxyCODONE (OXY IR/ROXICODONE) 5 MG immediate release tablet Take 5 mg by mouth every 6 (six) hours as needed.   0  . promethazine  (PHENERGAN) 25 MG tablet Take 1 tablet (25 mg total) by mouth every 6 (six) hours as needed. Only takes for severe nausea 60 tablet 3  . valsartan (DIOVAN) 160 MG tablet Take 160 mg by mouth daily.  11  . [DISCONTINUED] digoxin (LANOXIN) 0.125 MG tablet Take 125 mcg by mouth daily.     No current facility-administered medications for this visit.    Allergies:   Benazepril; Adhesive; Morphine; Nifedipine; and Piroxicam   Past Medical History  Diagnosis Date  . Allergic rhinitis   . Asthma   . Gout     takes Colchicine prn  . Peptic ulcer disease   . History of PSVT (paroxysmal supraventricular tachycardia)   . HLD (hyperlipidemia)     pt doesn't take any medication for this  . DJD (degenerative joint disease)   . History of blood clots     right lung in early 90's  . Sleep apnea     doesn't use CPAP  . Shortness of breath     pt states that she can be sitting as well as exertion and get short of breath  . Bronchitis     in Jan 2013-uses Albuterol  prn  . History of migraine headaches     last migraine 73yrs ago;Pt states she does get sinus headaches  . Osteoarthritis   . Chronic back pain     hx buldging disc  . Rash     on neck and started after she started taking her beta blocker 40yrs ago;medical MD has seen this  . Gastric ulcer   . Hemorrhoids   . Family hx of colon cancer   . Diabetes mellitus     glimepiride  . Yeast infection of the vagina     being treated for this now by dr.fontaine  . Bronchitis, acute 03-01-11    FINSHED Z-PAK-STILL ON PREDNISONE NOW  . Peripheral edema     takes Furosemide every other day  . Dizziness     occasionally  . Constipation     takes Colace prn  . Colon cancer 2007    s/p surgery and chemo  . Urinary frequency   . Nocturia   . Anxiety     and panic attacks;pt states that she is claustrophobic  . Family history of anesthesia complication     daughter gets PONV  . Hypertension     takes Micardis and Diltiazem daily  . Atrial  fibrillation     takes Coumadin daily-instructed to stop  . Spinal headache   . Numbness     left hand  . Fibromyalgia     but doesn't take any meds  . GERD (gastroesophageal reflux disease)     uses Tums prn  . History of colon polyps   . Urinary frequency   . Peripheral edema     takes Lasix daily  . History of shingles     Past Surgical History  Procedure Laterality Date  . Caesarean section  1966/69/72    x 3  . Heel spur surgery  1992    x 2  . Ankle surgery      tumor-benign removed left ankle  . Lumbar disc surgery  2008  . Pyelonidal cystectomy      at age 52  . Knee arthrosocpy      bil;couple of years before knee replacement  . Ganglion cyst excision  > 29yrs ago    removed from left pointer finger  . Cataract surgery  2010  . Port a cath placed  2008  . Port a cath removed  2008  . Knee surgery      left TKA  . Anterior cervical decomp/discectomy fusion  01/12/2011    Procedure: ANTERIOR CERVICAL DECOMPRESSION/DISCECTOMY FUSION 2 LEVELS;  Surgeon: Cooper Render Pool;  Location: [facility];  Service: Neurosurgery;  Laterality: Bilateral;  Cervical five-six, six-seven anterior cervical discectomy and fusion with allograft and plating  . Hysteroscopy w/d&c  02/19/2011    Procedure: DILATATION AND CURETTAGE /HYSTEROSCOPY;  Surgeon: Anastasio Auerbach, MD;  Location: [facility];  Service: Gynecology;  Laterality: N/A;  requests one hour  . Dilation and curettage of uterus  02-19-11    & POLYP REMOVAL  . Anterior cervical decomp/discectomy fusion  06/11/2011    Procedure: ANTERIOR CERVICAL DECOMPRESSION/DISCECTOMY FUSION 1 LEVEL/HARDWARE REMOVAL;  Surgeon: Charlie Pitter, MD;  Location: [facility];  Service: Neurosurgery;  Laterality: N/A;  Cervical four-five anterior cervical decompression fusion with allograft and plating; Removal of Cervical five to seven plate  . Joint replacement Left     knee  . Colectomy  2007    colon cancer  . Cardiac catheterization  90's and  2005  . Tubal ligation  1972  . Colonoscopy    . Bilateral cataracts removed    . Shoulder arthroscopy with subacromial decompression and open rotator c Right 03/16/2013    Procedure: RIGHT SHOULDER ARTHROSCOPY WITH SUBACROMIAL DECOMPRESSION AND OPEN ROTATOR CUFF REPAIR, OPEN DISTAL CLAVICLE  RESECTION POSSIBLE BICEP TENODESIS ;  Surgeon: Augustin Schooling, MD;  Location: [facility];  Service: Orthopedics;  Laterality: Right;     Social History:  The patient  reports that she has quit smoking. Her smoking use included Cigarettes. She has a 45 pack-year smoking history. She has never used smokeless tobacco. She reports that she does not drink alcohol or use illicit drugs.   Family History:  The patient's family history includes Cancer in her brother, brother, and sister; Coronary artery disease in her other; Heart attack in her mother; Heart disease in her father; Hypertension in her mother and sister; Liver cancer in her brother; Stroke in her mother. There is no history of Anesthesia problems, Hypotension, Malignant hyperthermia, or Pseudochol deficiency.    ROS:  Please see the history of present illness. All other systems are reviewed and  Negative to the above problem except as noted.    PHYSICAL EXAM: VS:  BP 148/62 mmHg  Pulse 76  Ht 5\' 3"  (1.6 m)  Wt 238 lb 6.4 oz (108.138 kg)  BMI 42.24 kg/m2  LMP 12/30/2000  GEN: Well nourished, well developed, in no acute distress HEENT: normal Neck: no JVD, carotid bruits, or masses Cardiac: RRR; no murmurs, rubs, or gallops,no edema  Respiratory:  clear to auscultation bilaterally, normal work of breathing GI: soft, nontender, nondistended, + BS  No hepatomegaly  MS: no deformity Moving all extremities   Skin: warm and dry, no rash Neuro:  Strength and sensation are intact Psych: euthymic mood, full affect   EKG:  EKG is ordered today.  SR with PACs  76 bpm     Lipid Panel    Component Value Date/Time   CHOL 219* 05/22/2014 1135   TRIG  281.0* 05/22/2014 1135   HDL 52.20 05/22/2014 1135   CHOLHDL 4 05/22/2014 1135   VLDL 56.2* 05/22/2014 1135   LDLCALC * 06/05/2009 0547    132        Total Cholesterol/HDL:CHD Risk Coronary Heart Disease Risk Table                     Men   Women  1/2 Average Risk   3.4   3.3  Average Risk       5.0   4.4  2 X Average Risk   9.6   7.1  3 X Average Risk  23.4   11.0        Use the calculated Patient Ratio above and the CHD Risk Table to determine the patient's CHD Risk.        ATP III CLASSIFICATION (LDL):  <100     mg/dL   Optimal  100-129  mg/dL   Near or Above                    Optimal  130-159  mg/dL   Borderline  160-189  mg/dL   High  >190     mg/dL   Very High   LDLDIRECT 133.0 05/22/2014 1135      Wt Readings from Last 3 Encounters:  07/18/14 238 lb 6.4 oz (108.138 kg)  05/22/14 240 lb (108.863 kg)  10/01/13 251 lb (113.853  kg)      ASSESSMENT AND PLAN: 1.  Atrial fibrillation  Patinet remains in SR  Discussed risk for CVA  Patient's CHADSVASc score is 4  She should be on anticoagulant  Refuses coumadin  Discussed NOACs  She says very expensive  Samples offered  She says she wants to have her orhtopedic issues addressed first  Does not want to start blood thinner  2.  HTN  Adequate control  Follow  Use lasix as needed      Current medicines are reviewed at length with the patient today.  The patient does not have concerns regarding medicines.  The following changes have been made: Off of eliquis  Lasix prn  Labs/ tests ordered today include:BMET and CBC  Orders Placed This Encounter  Procedures  . CBC w/Diff  . Basic metabolic panel  . EKG 12-Lead     Disposition:   FU with me in the fall.    Signed, Dorris Carnes, MD  07/18/2014 1:57 PM    [facility] [address], [place], [place]  [address] Phone: [phone]; Fax: [phone]

## 2014-08-01 DIAGNOSIS — M65312 Trigger thumb, left thumb: Secondary | ICD-10-CM | POA: Diagnosis not present

## 2014-08-01 DIAGNOSIS — M654 Radial styloid tenosynovitis [de Quervain]: Secondary | ICD-10-CM | POA: Diagnosis not present

## 2014-08-07 ENCOUNTER — Other Ambulatory Visit: Payer: Self-pay | Admitting: Internal Medicine

## 2014-08-13 ENCOUNTER — Encounter: Payer: Self-pay | Admitting: Internal Medicine

## 2014-08-13 ENCOUNTER — Ambulatory Visit (INDEPENDENT_AMBULATORY_CARE_PROVIDER_SITE_OTHER): Payer: Medicare Other | Admitting: Internal Medicine

## 2014-08-13 VITALS — BP 136/68 | HR 69 | Temp 98.1°F | Resp 12 | Ht 63.0 in | Wt 239.1 lb

## 2014-08-13 DIAGNOSIS — R112 Nausea with vomiting, unspecified: Secondary | ICD-10-CM | POA: Diagnosis not present

## 2014-08-13 MED ORDER — PROMETHAZINE HCL 25 MG PO TABS: 25.0000 mg | ORAL_TABLET | Freq: Four times a day (QID) | ORAL | Status: DC | PRN

## 2014-08-13 MED ORDER — PANTOPRAZOLE SODIUM 40 MG PO TBEC: 40.0000 mg | DELAYED_RELEASE_TABLET | Freq: Every day | ORAL | Status: DC

## 2014-08-13 NOTE — Progress Notes (Signed)
Pre visit review using our clinic review tool, if applicable. No additional management support is needed unless otherwise documented below in the visit note. 

## 2014-08-13 NOTE — Patient Instructions (Addendum)
We have sent in the refill of the phenergan. We have also sent in protonix which is medicine for the stomach to help with the pain. Take 1 pill before breakfast and you should have improvement within 1 week. We will also get you in with the stomach doctor.   Food Choices for Gastroesophageal Reflux Disease When you have gastroesophageal reflux disease (GERD), the foods you eat and your eating habits are very important. Choosing the right foods can help ease the discomfort of GERD. WHAT GENERAL GUIDELINES DO I NEED TO FOLLOW?  Choose fruits, vegetables, whole grains, low-fat dairy products, and low-fat meat, fish, and poultry.  Limit fats such as oils, salad dressings, butter, nuts, and avocado.  Keep a food diary to identify foods that cause symptoms.  Avoid foods that cause reflux. These may be different for different people.  Eat frequent small meals instead of three large meals each day.  Eat your meals slowly, in a relaxed setting.  Limit fried foods.  Cook foods using methods other than frying.  Avoid drinking alcohol.  Avoid drinking large amounts of liquids with your meals.  Avoid bending over or lying down until 2-3 hours after eating. WHAT FOODS ARE NOT RECOMMENDED? The following are some foods and drinks that may worsen your symptoms: Vegetables Tomatoes. Tomato juice. Tomato and spaghetti sauce. Chili peppers. Onion and garlic. Horseradish. Fruits Oranges, grapefruit, and lemon (fruit and juice). Meats High-fat meats, fish, and poultry. This includes hot dogs, ribs, ham, sausage, salami, and bacon. Dairy Whole milk and chocolate milk. Sour cream. Cream. Butter. Ice cream. Cream cheese.  Beverages Coffee and tea, with or without caffeine. Carbonated beverages or energy drinks. Condiments Hot sauce. Barbecue sauce.  Sweets/Desserts Chocolate and cocoa. Donuts. Peppermint and spearmint. Fats and Oils High-fat foods, including [place] fries and potato  chips. Other Vinegar. Strong spices, such as black pepper, white pepper, red pepper, cayenne, curry powder, cloves, ginger, and chili powder. The items listed above may not be a complete list of foods and beverages to avoid. Contact your dietitian for more information. Document Released: 02/08/2005 Document Revised: 02/13/2013 Document Reviewed: 12/13/2012 [facility] Patient Information 2015 [place], [place]. This information is not intended to replace advice given to you by your health care provider. Make sure you discuss any questions you have with your health care provider.

## 2014-08-16 DIAGNOSIS — R110 Nausea: Secondary | ICD-10-CM | POA: Insufficient documentation

## 2014-08-16 DIAGNOSIS — R112 Nausea with vomiting, unspecified: Secondary | ICD-10-CM | POA: Insufficient documentation

## 2014-08-16 NOTE — Progress Notes (Signed)
   Subjective:    Patient ID: [name], female    DOB: 1940-03-24, 74 y.o.   MRN: [id number]  HPI The patient is a 74 YO female coming in for stomach pain and vomiting. Denies blood in the vomit. This is a new problem going on for 4 days. Has not tried anything for it. Moderate problem. Worse with eating and pain is mostly in the middle and top of her stomach. Sometimes sour taste in her mouth. She is concerned as there is a lot of cancer including GI cancers in the family. She denies weight loss or fevers.   Review of Systems  Constitutional: Negative for fever, activity change, appetite change, fatigue and unexpected weight change.  Respiratory: Negative for cough, chest tightness, shortness of breath and wheezing.   Cardiovascular: Negative for chest pain, palpitations and leg swelling.  Gastrointestinal: Positive for nausea, vomiting, abdominal pain and abdominal distention. Negative for diarrhea, constipation and blood in stool.  Skin: Negative.       Objective:   Physical Exam  Constitutional: She is oriented to person, place, and time. She appears well-developed and well-nourished.  HENT:  Head: Normocephalic and atraumatic.  Eyes: EOM are normal.  Neck: Normal range of motion.  Cardiovascular: Normal rate and regular rhythm.   Pulmonary/Chest: Effort normal and breath sounds normal. No respiratory distress. She has no wheezes. She has no rales.  Abdominal: Soft. Bowel sounds are normal. She exhibits no distension. There is no tenderness. There is no rebound.  Neurological: She is alert and oriented to person, place, and time. Coordination normal.  Skin: Skin is warm and dry.   Filed Vitals:   08/13/14 1430  BP: 136/68  Pulse: 69  Temp: 98.1 F (36.7 C)  TempSrc: Oral  Resp: 12  Height: 5\' 3"  (1.6 m)  Weight: 239 lb 1.9 oz (108.464 kg)  SpO2: 96%      Assessment & Plan:

## 2014-08-16 NOTE — Assessment & Plan Note (Signed)
She has personal history of colon cancer and family history of GI cancers. Will refer to GI now. Sounds to be GERD but given the sudden onset needs evaluation. Starting protonix for the pain and phenergan refilled for nausea to prevent vomiting.

## 2014-08-20 ENCOUNTER — Other Ambulatory Visit: Payer: Self-pay | Admitting: Internal Medicine

## 2014-08-22 ENCOUNTER — Encounter: Payer: Self-pay | Admitting: *Deleted

## 2014-08-23 ENCOUNTER — Telehealth: Payer: Self-pay

## 2014-08-23 NOTE — Telephone Encounter (Signed)
Called patient to see if a recent mammogram has been done. LMOVM to call back, need to know if recent mammogram has been done (where,when) and if not if pt would like to proceed with an order?

## 2014-09-04 ENCOUNTER — Other Ambulatory Visit: Payer: Self-pay | Admitting: Orthopedic Surgery

## 2014-09-04 DIAGNOSIS — D1722 Benign lipomatous neoplasm of skin and subcutaneous tissue of left arm: Secondary | ICD-10-CM | POA: Diagnosis not present

## 2014-09-04 DIAGNOSIS — M654 Radial styloid tenosynovitis [de Quervain]: Secondary | ICD-10-CM | POA: Diagnosis not present

## 2014-09-04 DIAGNOSIS — M65312 Trigger thumb, left thumb: Secondary | ICD-10-CM | POA: Diagnosis not present

## 2014-09-04 DIAGNOSIS — M659 Synovitis and tenosynovitis, unspecified: Secondary | ICD-10-CM | POA: Diagnosis not present

## 2014-09-23 ENCOUNTER — Other Ambulatory Visit: Payer: Self-pay | Admitting: Internal Medicine

## 2014-09-30 ENCOUNTER — Encounter: Payer: Self-pay | Admitting: Physician Assistant

## 2014-09-30 ENCOUNTER — Ambulatory Visit (INDEPENDENT_AMBULATORY_CARE_PROVIDER_SITE_OTHER): Payer: Medicare Other | Admitting: Physician Assistant

## 2014-09-30 VITALS — BP 144/80 | HR 76 | Ht 62.0 in | Wt 241.0 lb

## 2014-09-30 DIAGNOSIS — K219 Gastro-esophageal reflux disease without esophagitis: Secondary | ICD-10-CM | POA: Diagnosis not present

## 2014-09-30 DIAGNOSIS — R1314 Dysphagia, pharyngoesophageal phase: Secondary | ICD-10-CM | POA: Diagnosis not present

## 2014-09-30 DIAGNOSIS — R490 Dysphonia: Secondary | ICD-10-CM | POA: Diagnosis not present

## 2014-09-30 DIAGNOSIS — R1013 Epigastric pain: Secondary | ICD-10-CM | POA: Diagnosis not present

## 2014-09-30 MED ORDER — PANTOPRAZOLE SODIUM 40 MG PO TBEC
40.0000 mg | DELAYED_RELEASE_TABLET | Freq: Every day | ORAL | Status: DC
Start: 2014-09-30 — End: 2016-03-02

## 2014-09-30 NOTE — Progress Notes (Signed)
i agree with the above note, plan 

## 2014-09-30 NOTE — Patient Instructions (Addendum)
We have sent the following medications to your pharmacy for you to pick up at your convenience:  Protonix  You have been scheduled for an endoscopy. Please follow written instructions given to you at your visit today. If you use inhalers (even only as needed), please bring them with you on the day of your procedure. Your physician has requested that you go to www.startemmi.com and enter the access code given to you at your visit today. This web site gives a general overview about your procedure. However, you should still follow specific instructions given to you by our office regarding your preparation for the procedure.  

## 2014-09-30 NOTE — Progress Notes (Signed)
Patient ID: [name], female   DOB: [birth year], 74 y.o.   MRN: [id number]   Subjective:    Patient ID: [name], female    DOB: 03/29/1940, 74 y.o.   MRN: [id number]  HPI [name] is a 74 year old white female known to Dr. Ardis Hughs who is referred today by Dr. Doug Sou  for evaluation of epigastric pain and nausea. She is known to Dr. Ardis Hughs from prior colonoscopies. She has history of colon cancer which was diagnosed in 2007 this was a right-sided lesion T2 N1. Her last colonoscopy was done in March 2014 negative exam, she was recommended for 5 year follow-up. Other medical problems include atrial fibrillation for which she had a cardioversion in April 2016, sleep apnea, adult-onset diabetes mellitus, obesity, hypertension, and GERD. She says she has never had an endoscopy. She had been taking chronic pain medication for her arm and had had a recent surgery. She says she was switched from oxycodone to hydrocodone and feels that the acetaminophen is making her nauseated. The background of that she says she has had stomach problems for a long time with acid reflux with frequent burning in her epigastrium and in her esophagus. She has intermittent choking sensation which she says tot happens at times when she has not even eating or drinking. Occasional solid food dysphagia no problems with regurgitation and no difficulty with liquids. She did undergo a cervical disc fusion and says she has been having some spasms intermittently in her neck muscles and also feels a fullness in the supraclavicular area when lying on her side. She says she also at times feels a lump sensation in her throat. She has been on Protonix 40 mg by mouth daily but had not been taking regularly. No regular NSAIDs  Review of Systems Pertinent positive and negative review of systems were noted in the above HPI section.  All other review of systems was otherwise negative.  Outpatient Encounter Prescriptions as of 09/30/2014  Medication  Sig  . albuterol (PROVENTIL HFA;VENTOLIN HFA) 108 (90 BASE) MCG/ACT inhaler Inhale 2 puffs into the lungs every 6 (six) hours as needed for wheezing or shortness of breath.  . ALPRAZolam (XANAX) 0.5 MG tablet Take 1 tablet (0.5 mg total) by mouth 3 (three) times daily as needed for sleep or anxiety. Take 1 tab every 6hrs prn  . diltiazem (CARDIZEM CD) 240 MG 24 hr capsule TAKE ONE CAPSULE BY MOUTH EVERY DAY  . furosemide (LASIX) 40 MG tablet Take 40 mg by mouth as needed.  [name] glimepiride (AMARYL) 4 MG tablet TAKE 1 TABLET BY MOUTH EVERY DAY WITH BREAKFAST - NEEDS OFFICE VISIT FOR MORE REFILLS  . HYDROcodone-acetaminophen (NORCO/VICODIN) 5-325 MG per tablet Take 1 tablet by mouth every 6 (six) hours as needed for moderate pain.  [name] loperamide (IMODIUM A-D) 2 MG tablet Take 2 mg by mouth as needed for diarrhea or loose stools.  . pantoprazole (PROTONIX) 40 MG tablet Take 1 tablet (40 mg total) by mouth daily.  . promethazine (PHENERGAN) 25 MG tablet Take 1 tablet (25 mg total) by mouth every 6 (six) hours as needed. Only takes for severe nausea  . valsartan (DIOVAN) 160 MG tablet Take 160 mg by mouth daily.  . [DISCONTINUED] pantoprazole (PROTONIX) 40 MG tablet Take 1 tablet (40 mg total) by mouth daily.  . [DISCONTINUED] ELIQUIS 5 MG TABS tablet TAKE 1 TABLET (5 MG TOTAL) BY MOUTH 2 (TWO) TIMES DAILY. (Patient not taking: Reported on 07/18/2014)  . [DISCONTINUED] oxyCODONE (OXY  IR/ROXICODONE) 5 MG immediate release tablet Take 5 mg by mouth every 6 (six) hours as needed.    No facility-administered encounter medications on file as of 09/30/2014.   Allergies  Allergen Reactions  . Benazepril Other (See Comments)    coughing  . Adhesive [Tape] Other (See Comments)    redness  . Morphine Other (See Comments)       Makes you feel bad all over  . Nifedipine Hives and Itching  . Piroxicam Hives and Itching   Patient Active Problem List   Diagnosis Date Noted  . Nausea with vomiting 08/16/2014  .  Anxiety 06/28/2011  . Hyperlipidemia 02/10/2011  . Atrial fibrillation 09/16/2010  . SLEEP APNEA, OBSTRUCTIVE 04/24/2010  . Diabetes mellitus type 2, uncomplicated 51/03/5850  . History of malignant neoplasm of large intestine 12/25/2008  . GOUT 01/03/2007  . Essential hypertension 01/03/2007  . Allergic rhinitis 01/03/2007  . Asthma 01/03/2007  . Osteoarthrosis, unspecified whether generalized or localized, involving lower leg 01/03/2007  . Ward DISEASE 01/03/2007   History   Social History  . Marital Status: Married    Spouse Name: N/A  . Number of Children: 3  . Years of Education: N/A   Occupational History  . retired    Social History Main Topics  . Smoking status: Former Smoker -- 1.50 packs/day for 30 years    Types: Cigarettes    Quit date: 02/23/1991  . Smokeless tobacco: Never Used     Comment: quit smoking 34yrs ago  . Alcohol Use: No  . Drug Use: No  . Sexual Activity:    Partners: Male    Birth Control/ Protection: Post-menopausal   Other Topics Concern  . Not on file   Social History Narrative   Regular exercise- no.    Ms. Ast family history includes Bladder Cancer in her brother; Coronary artery disease in her other; Heart attack in her mother; Heart disease in her father; Hypertension in her mother and sister; Kidney cancer in her sister; Liver cancer in her brother; Stroke in her mother. There is no history of Anesthesia problems, Hypotension, Malignant hyperthermia, or Pseudochol deficiency.      Objective:    Filed Vitals:   09/30/14 0834  BP: 144/80  Pulse: 76    Physical Exam  well-developed older white female in no acute distress, pleasant blood pressure 144/80 pulse 76 height 5 foot 2 weight 241, BMI 44. HEENT; nontraumatic normocephalic EOMI PERRLA sclera anicteric, Supple; no JVD, no palpable adenopathy or thyromegaly, Cardiovascular; regular rate and rhythm with S1-S2 no murmur or gallop, Pulmonary; clear bilaterally,  Abdomen ;obese soft no focal tenderness no guarding or rebound no palpable mass or hepatosplenomegaly, Rectal ;exam not done, Extremities; no clubbing cyanosis or edema skin warm and dry, Neuropsych; mood and affect appropriate       Assessment & Plan:   #1 74 yo female with hx of Colon cancer 2007 T2N1- last colonoscopy 04/2012 negative #2 chronic GERD, epigastric burning, indigestion fullness in throat- probably all reflux related- r/o PUD, Gastropathy, hpylori #3 OSA #4 prior cervical disc fusion #5 obesity #6 AODM #7 hx atrial fib-s/p ablation  Plan; Pt will use Protonix 40 mg po qam regularly Schedule for EGD with Dr Ardis Hughs. procedure discussed in detail  with pt and she is agreeable to proceed. If Egd unrevealing consider barium swallow and neck films as has hardware in neck   Luda Charbonneau S Laine Giovanetti PA-C 09/30/2014   Cc: Olga Millers, MD

## 2014-10-07 ENCOUNTER — Encounter: Payer: Self-pay | Admitting: Gastroenterology

## 2014-10-07 ENCOUNTER — Ambulatory Visit (AMBULATORY_SURGERY_CENTER): Payer: Medicare Other | Admitting: Gastroenterology

## 2014-10-07 VITALS — BP 156/88 | HR 62 | Temp 97.7°F | Resp 12 | Ht 62.0 in | Wt 241.0 lb

## 2014-10-07 DIAGNOSIS — K219 Gastro-esophageal reflux disease without esophagitis: Secondary | ICD-10-CM

## 2014-10-07 DIAGNOSIS — J45909 Unspecified asthma, uncomplicated: Secondary | ICD-10-CM | POA: Diagnosis not present

## 2014-10-07 DIAGNOSIS — I10 Essential (primary) hypertension: Secondary | ICD-10-CM | POA: Diagnosis not present

## 2014-10-07 DIAGNOSIS — I4891 Unspecified atrial fibrillation: Secondary | ICD-10-CM | POA: Diagnosis not present

## 2014-10-07 DIAGNOSIS — E119 Type 2 diabetes mellitus without complications: Secondary | ICD-10-CM | POA: Diagnosis not present

## 2014-10-07 DIAGNOSIS — R1310 Dysphagia, unspecified: Secondary | ICD-10-CM | POA: Diagnosis not present

## 2014-10-07 DIAGNOSIS — R1013 Epigastric pain: Secondary | ICD-10-CM | POA: Diagnosis not present

## 2014-10-07 LAB — GLUCOSE, CAPILLARY
GLUCOSE-CAPILLARY: 117 mg/dL — AB (ref 65–99)
Glucose-Capillary: 128 mg/dL — ABNORMAL HIGH (ref 65–99)

## 2014-10-07 MED ORDER — SODIUM CHLORIDE 0.9 % IV SOLN: 500.0000 mL | INTRAVENOUS | Status: DC

## 2014-10-07 NOTE — Progress Notes (Signed)
Report to PACU, RN, vss, BBS= Clear.  

## 2014-10-07 NOTE — Progress Notes (Signed)
Pt. Arrived to facility with dressing to left thumb and brace to left arm.

## 2014-10-07 NOTE — Patient Instructions (Signed)
YOU HAD AN ENDOSCOPIC PROCEDURE TODAY AT [facility]:   Refer to the procedure report that was given to you for any specific questions about what was found during the examination.  If the procedure report does not answer your questions, please call your gastroenterologist to clarify.  If you requested that your care partner not be given the details of your procedure findings, then the procedure report has been included in a sealed envelope for you to review at your convenience later.  YOU SHOULD EXPECT: Some feelings of bloating in the abdomen. Passage of more gas than usual.  Walking can help get rid of the air that was put into your GI tract during the procedure and reduce the bloating. If you had a lower endoscopy (such as a colonoscopy or flexible sigmoidoscopy) you may notice spotting of blood in your stool or on the toilet paper. If you underwent a bowel prep for your procedure, you may not have a normal bowel movement for a few days.  Please Note:  You might notice some irritation and congestion in your nose or some drainage.  This is from the oxygen used during your procedure.  There is no need for concern and it should clear up in a day or so.  SYMPTOMS TO REPORT IMMEDIATELY:    Following upper endoscopy (EGD)  Vomiting of blood or coffee ground material  New chest pain or pain under the shoulder blades  Painful or persistently difficult swallowing  New shortness of breath  Fever of 100F or higher  Black, tarry-looking stools  For urgent or emergent issues, a gastroenterologist can be reached at any hour by calling [phone].   DIET: Your first meal following the procedure should be a small meal and then it is ok to progress to your normal diet. Heavy or fried foods are harder to digest and may make you feel nauseous or bloated.  Likewise, meals heavy in dairy and vegetables can increase bloating.  Drink plenty of fluids but you should avoid alcoholic beverages  for 24 hours.  ACTIVITY:  You should plan to take it easy for the rest of today and you should NOT DRIVE or use heavy machinery until tomorrow (because of the sedation medicines used during the test).    FOLLOW UP: Our staff will call the number listed on your records the next business day following your procedure to check on you and address any questions or concerns that you may have regarding the information given to you following your procedure. If we do not reach you, we will leave a message.  However, if you are feeling well and you are not experiencing any problems, there is no need to return our call.  We will assume that you have returned to your regular daily activities without incident.  If any biopsies were taken you will be contacted by phone or by letter within the next 1-3 weeks.  Please call us at [phone] if you have not heard about the biopsies in 3 weeks.    SIGNATURES/CONFIDENTIALITY: You and/or your care partner have signed paperwork which will be entered into your electronic medical record.  These signatures attest to the fact that that the information above on your After Visit Summary has been reviewed and is understood.  Full responsibility of the confidentiality of this discharge information lies with you and/or your care-partner.  Referral for ENT- Office will schedule appt Continue antiacid once daily medication- best to take 20-30 minutes prior to  breakfast

## 2014-10-07 NOTE — Op Note (Signed)
[facility]  [address] [place], [address]   ENDOSCOPY PROCEDURE REPORT  PATIENT: Julie, Cooper  MR#: [id number] BIRTHDATE: March 09, 1940 , 61  yrs. old GENDER: female ENDOSCOPIST: Milus Banister, MD PROCEDURE DATE:  10/07/2014 PROCEDURE:  EGD, diagnostic ASA CLASS:     Class II INDICATIONS:  globus, +/- dysphagia, GERD. MEDICATIONS: Monitored anesthesia care, Propofol 100 mg IV, and lidocaine 160mg  IV TOPICAL ANESTHETIC: none  DESCRIPTION OF PROCEDURE: After the risks benefits and alternatives of the procedure were thoroughly explained, informed consent was obtained.  The LB [id number] endoscope was introduced through the mouth and advanced to the second portion of the duodenum , Without limitations.  The instrument was slowly withdrawn as the mucosa was fully examined.   EXAM: The esophagus and gastroesophageal junction were completely normal in appearance.  The stomach was entered and closely examined.The antrum, angularis, and lesser curvature were well visualized, including a retroflexed view of the cardia and fundus. The stomach wall was normally distensable.  The scope passed easily through the pylorus into the duodenum.  Retroflexed views revealed no abnormalities.     The scope was then withdrawn from the patient and the procedure completed.  COMPLICATIONS: There were no immediate complications.  ENDOSCOPIC IMPRESSION: Normal appearing esophagus and GE junction, the stomach was well visualized and normal in appearance, normal appearing duodenum  RECOMMENDATIONS: My office will arrange referral to ENT for globus sensation, clearing throat.  For now, continue once daily antiacid medicine. THat is best taken 20-30 min prior to breakfast meal.   eSigned:  Milus Banister, MD 2014-10-07 25:95:63.875

## 2014-10-08 ENCOUNTER — Telehealth: Payer: Self-pay

## 2014-10-08 ENCOUNTER — Telehealth: Payer: Self-pay | Admitting: *Deleted

## 2014-10-08 DIAGNOSIS — M65312 Trigger thumb, left thumb: Secondary | ICD-10-CM | POA: Diagnosis not present

## 2014-10-08 DIAGNOSIS — M654 Radial styloid tenosynovitis [de Quervain]: Secondary | ICD-10-CM | POA: Diagnosis not present

## 2014-10-08 NOTE — Telephone Encounter (Signed)
My office will arrange referral to ENT for globus sensation, clearing throat. For now, continue once daily antiacid medicine. THat is best taken 20-30 min prior to breakfast meal.

## 2014-10-08 NOTE — Telephone Encounter (Signed)
  Follow up Call-  Call back number 10/07/2014 04/26/2012 04/26/2012  Post procedure Call Back phone  # [phone]  Permission to leave phone message Yes Yes Yes     Patient questions:  Do you have a fever, pain , or abdominal swelling? No. Pain Score  0 *  Have you tolerated food without any problems? Yes.    Have you been able to return to your normal activities? Yes.    Do you have any questions about your discharge instructions: Diet   No. Medications  No. Follow up visit  No.  Do you have questions or concerns about your Care? No.  Actions: * If pain score is 4 or above: No action needed, pain <4.

## 2014-10-10 NOTE — Telephone Encounter (Signed)
Pt has been given the appt info and will bring all requested items and arrive at 3 pm.  She was given the address and phone number to the office.  She was also told to continue daily antacid 20-30 min prior to breakfast

## 2014-10-10 NOTE — Telephone Encounter (Signed)
[place], [place] Phone [phone] Fax [address] [address] 10/14/14 3:20 pm arrive 3:00 pm bring insurance card and med list   Left message on machine to call back

## 2014-10-14 ENCOUNTER — Other Ambulatory Visit: Payer: Self-pay | Admitting: Internal Medicine

## 2014-10-14 DIAGNOSIS — R490 Dysphonia: Secondary | ICD-10-CM | POA: Diagnosis not present

## 2014-10-14 DIAGNOSIS — K219 Gastro-esophageal reflux disease without esophagitis: Secondary | ICD-10-CM | POA: Diagnosis not present

## 2014-10-14 DIAGNOSIS — R1310 Dysphagia, unspecified: Secondary | ICD-10-CM | POA: Diagnosis not present

## 2014-10-14 DIAGNOSIS — J309 Allergic rhinitis, unspecified: Secondary | ICD-10-CM | POA: Diagnosis not present

## 2014-10-14 NOTE — Telephone Encounter (Signed)
Pt left msg on triage Friday afternoon @ 4:54 stating she is needing refill on her glimepiride. Electronic refill came through this am med has been refill...[name]

## 2014-10-31 ENCOUNTER — Ambulatory Visit: Payer: Medicare Other | Admitting: Internal Medicine

## 2014-11-01 ENCOUNTER — Telehealth: Payer: Self-pay | Admitting: *Deleted

## 2014-11-01 NOTE — Telephone Encounter (Signed)
Patient called back for lab results from 07-19-14.  Advised of results and message to watch her sugar level.  Patient expressed understanding.

## 2014-11-01 NOTE — Telephone Encounter (Signed)
Receive call pt requesting refill on her hydrocodone...[name]

## 2014-11-01 NOTE — Telephone Encounter (Signed)
Notified pt with md response. Pt states that Dr. Apolonio Schneiders was rx the oxycodone, but he told her that she would have to get refills from her PCP. Pt already has an appt for 11/22/14 will discuss then...Johny Chess

## 2014-11-01 NOTE — Telephone Encounter (Signed)
She has not been prescribed hydrocodone by anyone in the last year per [name] narcotic database. Have never prescribed and would need visit to discuss. Additionally she is filling almost monthly oxycodone from her hand doctor. Will deny.

## 2014-11-06 DIAGNOSIS — M654 Radial styloid tenosynovitis [de Quervain]: Secondary | ICD-10-CM | POA: Diagnosis not present

## 2014-11-06 DIAGNOSIS — Y999 Unspecified external cause status: Secondary | ICD-10-CM | POA: Diagnosis not present

## 2014-11-06 DIAGNOSIS — Y929 Unspecified place or not applicable: Secondary | ICD-10-CM | POA: Diagnosis not present

## 2014-11-06 DIAGNOSIS — M65312 Trigger thumb, left thumb: Secondary | ICD-10-CM | POA: Diagnosis not present

## 2014-11-06 DIAGNOSIS — S56502A Unspecified injury of other extensor muscle, fascia and tendon at forearm level, left arm, initial encounter: Secondary | ICD-10-CM | POA: Diagnosis not present

## 2014-11-09 ENCOUNTER — Other Ambulatory Visit: Payer: Self-pay | Admitting: Internal Medicine

## 2014-11-17 ENCOUNTER — Other Ambulatory Visit: Payer: Self-pay | Admitting: Internal Medicine

## 2014-11-22 ENCOUNTER — Other Ambulatory Visit (INDEPENDENT_AMBULATORY_CARE_PROVIDER_SITE_OTHER): Payer: Medicare Other

## 2014-11-22 ENCOUNTER — Encounter: Payer: Self-pay | Admitting: Internal Medicine

## 2014-11-22 ENCOUNTER — Ambulatory Visit (INDEPENDENT_AMBULATORY_CARE_PROVIDER_SITE_OTHER): Payer: Medicare Other | Admitting: Internal Medicine

## 2014-11-22 VITALS — BP 152/62 | HR 77 | Temp 98.1°F | Resp 14 | Ht 63.0 in | Wt 243.4 lb

## 2014-11-22 DIAGNOSIS — I10 Essential (primary) hypertension: Secondary | ICD-10-CM

## 2014-11-22 DIAGNOSIS — E119 Type 2 diabetes mellitus without complications: Secondary | ICD-10-CM | POA: Diagnosis not present

## 2014-11-22 DIAGNOSIS — E1165 Type 2 diabetes mellitus with hyperglycemia: Secondary | ICD-10-CM | POA: Diagnosis not present

## 2014-11-22 DIAGNOSIS — IMO0001 Reserved for inherently not codable concepts without codable children: Secondary | ICD-10-CM

## 2014-11-22 LAB — BASIC METABOLIC PANEL
BUN: 20 mg/dL (ref 6–23)
CHLORIDE: 103 meq/L (ref 96–112)
CO2: 29 mEq/L (ref 19–32)
Calcium: 9.8 mg/dL (ref 8.4–10.5)
Creatinine, Ser: 1.28 mg/dL — ABNORMAL HIGH (ref 0.40–1.20)
GFR: 43.49 mL/min — ABNORMAL LOW (ref >60.00)
GLUCOSE: 122 mg/dL — AB (ref 70–99)
POTASSIUM: 4 meq/L (ref 3.5–5.1)
Sodium: 141 mEq/L (ref 135–145)

## 2014-11-22 LAB — HEMOGLOBIN A1C: Hgb A1c MFr Bld: 7.2 % — ABNORMAL HIGH (ref 4.6–6.5)

## 2014-11-22 MED ORDER — GLIMEPIRIDE 4 MG PO TABS: ORAL_TABLET | ORAL | Status: DC

## 2014-11-22 NOTE — Progress Notes (Signed)
Pre visit review using our clinic review tool, if applicable. No additional management support is needed unless otherwise documented below in the visit note. 

## 2014-11-22 NOTE — Assessment & Plan Note (Addendum)
BP is elevated today, she has taken her medicine later than usual today. Admits that her BP is generally well controlled when she checks it at home. Previous value at goal so will adjust next visit if still elevated. Talked to her about limiting NSAIDs. Continue lasix, diltiazem, valsartan.

## 2014-11-22 NOTE — Assessment & Plan Note (Signed)
She is taking glimepiride and ARB. Checking HgA1c today and CMP and reminded her about the need for eye exam. No known complications. Does refuse all immunizations including the pneumonia shot.

## 2014-11-22 NOTE — Patient Instructions (Signed)
We will check the blood work today and have sent in your refill.   I hope your hand gets better soon!

## 2014-11-22 NOTE — Progress Notes (Signed)
   Subjective:    Patient ID: [name], female    DOB: 1940-05-18, 74 y.o.   MRN: [id number]  HPI The patient is a 74 YO female coming in for follow up of her diabetes (not well controlled, taking glimepiride and working on exercise, no known complications, not up to date on eye exam). Denies any hypoglycemia. She has recently had a surgery on her arm since she is struggling with trigger thumb and lost function they moved a tendon to try to restore some function to her thumb. This caused her to be using her pain medicine more which gave her stomach discomfort. She has just gotten a prescription for tramadol as it does not have the tylenol which irritates her stomach. She is doing okay overall with her stomach. No nausea or vomiting. Still some loose stools.   Review of Systems  Constitutional: Negative for fever, activity change, appetite change, fatigue and unexpected weight change.  HENT: Negative.   Eyes: Negative.   Respiratory: Negative for cough, chest tightness, shortness of breath and wheezing.   Cardiovascular: Negative for chest pain, palpitations and leg swelling.  Gastrointestinal: Positive for diarrhea. Negative for nausea, vomiting, abdominal pain, constipation, blood in stool and abdominal distention.  Musculoskeletal: Positive for myalgias and arthralgias. Negative for back pain and gait problem.  Skin: Negative.   Neurological: Negative.       Objective:   Physical Exam  Constitutional: She is oriented to person, place, and time. She appears well-developed and well-nourished.  HENT:  Head: Normocephalic and atraumatic.  Eyes: EOM are normal.  Neck: Normal range of motion.  Cardiovascular: Normal rate and regular rhythm.   Pulmonary/Chest: Effort normal and breath sounds normal. No respiratory distress. She has no wheezes. She has no rales.  Abdominal: Soft. Bowel sounds are normal. She exhibits no distension. There is no tenderness. There is no rebound.    Musculoskeletal:  Splint on left arm to the elbow with thumb in a rigid brace.   Neurological: She is alert and oriented to person, place, and time. Coordination normal.  Skin: Skin is warm and dry.   Filed Vitals:   11/22/14 1104 11/22/14 1126  BP: 150/64 152/62  Pulse: 77   Temp: 98.1 F (36.7 C)   TempSrc: Oral   Resp: 14   Height: 5\' 3"  (1.6 m)   Weight: 243 lb 6.4 oz (110.406 kg)   SpO2: 96%       Assessment & Plan:

## 2014-12-25 DIAGNOSIS — M654 Radial styloid tenosynovitis [de Quervain]: Secondary | ICD-10-CM | POA: Diagnosis not present

## 2014-12-25 DIAGNOSIS — M65312 Trigger thumb, left thumb: Secondary | ICD-10-CM | POA: Diagnosis not present

## 2015-01-01 DIAGNOSIS — M654 Radial styloid tenosynovitis [de Quervain]: Secondary | ICD-10-CM | POA: Diagnosis not present

## 2015-01-01 DIAGNOSIS — M65312 Trigger thumb, left thumb: Secondary | ICD-10-CM | POA: Diagnosis not present

## 2015-01-07 DIAGNOSIS — M6281 Muscle weakness (generalized): Secondary | ICD-10-CM | POA: Diagnosis not present

## 2015-01-20 ENCOUNTER — Telehealth: Payer: Self-pay | Admitting: *Deleted

## 2015-01-20 DIAGNOSIS — M5136 Other intervertebral disc degeneration, lumbar region: Secondary | ICD-10-CM | POA: Diagnosis not present

## 2015-01-20 DIAGNOSIS — S335XXA Sprain of ligaments of lumbar spine, initial encounter: Secondary | ICD-10-CM | POA: Diagnosis not present

## 2015-01-20 DIAGNOSIS — M533 Sacrococcygeal disorders, not elsewhere classified: Secondary | ICD-10-CM | POA: Diagnosis not present

## 2015-01-20 NOTE — Telephone Encounter (Signed)
Would need visit as we have never prescribed.

## 2015-01-20 NOTE — Telephone Encounter (Signed)
Received call pt is requesting refills on her tramadol...[name]

## 2015-01-20 NOTE — Telephone Encounter (Signed)
Called pt no answer LMOM with md response.../lmb 

## 2015-01-24 ENCOUNTER — Telehealth: Payer: Self-pay

## 2015-01-24 ENCOUNTER — Encounter (HOSPITAL_COMMUNITY): Payer: Self-pay

## 2015-01-24 ENCOUNTER — Emergency Department (HOSPITAL_COMMUNITY): Payer: Medicare Other

## 2015-01-24 ENCOUNTER — Emergency Department (HOSPITAL_COMMUNITY)
Admission: EM | Admit: 2015-01-24 | Discharge: 2015-01-25 | Disposition: A | Discharge status: Home / Self Care | Payer: Medicare Other | Attending: Emergency Medicine | Admitting: Emergency Medicine

## 2015-01-24 DIAGNOSIS — R002 Palpitations: Secondary | ICD-10-CM | POA: Diagnosis not present

## 2015-01-24 DIAGNOSIS — I480 Paroxysmal atrial fibrillation: Secondary | ICD-10-CM | POA: Diagnosis not present

## 2015-01-24 DIAGNOSIS — M797 Fibromyalgia: Secondary | ICD-10-CM | POA: Insufficient documentation

## 2015-01-24 DIAGNOSIS — E119 Type 2 diabetes mellitus without complications: Secondary | ICD-10-CM | POA: Diagnosis not present

## 2015-01-24 DIAGNOSIS — Z87891 Personal history of nicotine dependence: Secondary | ICD-10-CM | POA: Diagnosis not present

## 2015-01-24 DIAGNOSIS — Z7984 Long term (current) use of oral hypoglycemic drugs: Secondary | ICD-10-CM | POA: Diagnosis not present

## 2015-01-24 DIAGNOSIS — R0600 Dyspnea, unspecified: Secondary | ICD-10-CM | POA: Diagnosis not present

## 2015-01-24 DIAGNOSIS — Z79899 Other long term (current) drug therapy: Secondary | ICD-10-CM | POA: Insufficient documentation

## 2015-01-24 DIAGNOSIS — Z85038 Personal history of other malignant neoplasm of large intestine: Secondary | ICD-10-CM | POA: Diagnosis not present

## 2015-01-24 DIAGNOSIS — R0602 Shortness of breath: Secondary | ICD-10-CM | POA: Diagnosis not present

## 2015-01-24 DIAGNOSIS — G8929 Other chronic pain: Secondary | ICD-10-CM | POA: Diagnosis not present

## 2015-01-24 DIAGNOSIS — Z86010 Personal history of colonic polyps: Secondary | ICD-10-CM | POA: Diagnosis not present

## 2015-01-24 DIAGNOSIS — Z8619 Personal history of other infectious and parasitic diseases: Secondary | ICD-10-CM | POA: Insufficient documentation

## 2015-01-24 DIAGNOSIS — I10 Essential (primary) hypertension: Secondary | ICD-10-CM | POA: Insufficient documentation

## 2015-01-24 DIAGNOSIS — Z8711 Personal history of peptic ulcer disease: Secondary | ICD-10-CM | POA: Insufficient documentation

## 2015-01-24 DIAGNOSIS — J45909 Unspecified asthma, uncomplicated: Secondary | ICD-10-CM | POA: Insufficient documentation

## 2015-01-24 DIAGNOSIS — I4891 Unspecified atrial fibrillation: Secondary | ICD-10-CM | POA: Diagnosis not present

## 2015-01-24 DIAGNOSIS — F419 Anxiety disorder, unspecified: Secondary | ICD-10-CM | POA: Insufficient documentation

## 2015-01-24 DIAGNOSIS — K219 Gastro-esophageal reflux disease without esophagitis: Secondary | ICD-10-CM | POA: Insufficient documentation

## 2015-01-24 LAB — CBC
HEMATOCRIT: 45.2 % (ref 36.0–46.0)
Hemoglobin: 15 g/dL (ref 12.0–15.0)
MCH: 29 pg (ref 26.0–34.0)
MCHC: 33.2 g/dL (ref 30.0–36.0)
MCV: 87.3 fL (ref 78.0–100.0)
PLATELETS: 250 10*3/uL (ref 150–400)
RBC: 5.18 MIL/uL — ABNORMAL HIGH (ref 3.87–5.11)
RDW: 13.5 % (ref 11.5–15.5)
WBC: 10.5 10*3/uL (ref 4.0–10.5)

## 2015-01-24 LAB — BASIC METABOLIC PANEL
ANION GAP: 10 (ref 5–15)
BUN: 13 mg/dL (ref 6–20)
CHLORIDE: 102 mmol/L (ref 101–111)
CO2: 27 mmol/L (ref 22–32)
Calcium: 9.2 mg/dL (ref 8.9–10.3)
Creatinine, Ser: 1 mg/dL (ref 0.44–1.00)
GFR calc Af Amer: >60 mL/min (ref >60)
GFR, EST NON AFRICAN AMERICAN: 55 mL/min — AB (ref >60)
Glucose, Bld: 212 mg/dL — ABNORMAL HIGH (ref 65–99)
POTASSIUM: 4 mmol/L (ref 3.5–5.1)
SODIUM: 139 mmol/L (ref 135–145)

## 2015-01-24 MED ORDER — DILTIAZEM HCL 25 MG/5ML IV SOLN
20.0000 mg | Freq: Once | INTRAVENOUS | Status: AC
  Administered 2015-01-24: 20 mg via INTRAVENOUS
  Filled 2015-01-24: qty 5

## 2015-01-24 MED ORDER — DILTIAZEM HCL 100 MG IV SOLR: 10.0000 mg/h | Freq: Once | INTRAVENOUS | Status: DC

## 2015-01-24 NOTE — ED Notes (Signed)
Resident at bedside.  

## 2015-01-24 NOTE — ED Provider Notes (Signed)
CSN: [id number]     Arrival date & time 01/24/15  2137 History   First MD Initiated Contact with Patient 01/24/15 2156     Chief Complaint  Patient presents with  . Atrial Fibrillation     (Consider location/radiation/quality/duration/timing/severity/associated sxs/prior Treatment) HPI  Patient is a 74 year old female with past medical history significant for paroxysmal a fib (not on anticoagulation or aspirin), paroxysmal SVTasthma, hyperlipidemia, who presents to the emergency department with heart palpitations. Patient reports that she has fluttering in her chest and a daily basis. States she started to have a fluttering sensation yesterday but became progressively worsening since that time. Denies chest pain. Reports some mild shortness of breath earlier today that has resolved. Denies nausea, vomiting, diaphoresis, lightheadedness. Denies missing any medications, takes diltiazem 24 hr 240 mg daily.  Past Medical History  Diagnosis Date  . Allergic rhinitis   . Asthma   . Gout     takes Colchicine prn  . Peptic ulcer disease   . History of PSVT (paroxysmal supraventricular tachycardia)   . HLD (hyperlipidemia)     pt doesn't take any medication for this  . DJD (degenerative joint disease)   . History of blood clots     right lung in early 90's  . Sleep apnea     doesn't use CPAP  . Shortness of breath     pt states that she can be sitting as well as exertion and get short of breath  . Bronchitis     in Jan 2013-uses Albuterol prn  . History of migraine headaches     last migraine 44yrs ago;Pt states she does get sinus headaches  . Osteoarthritis   . Chronic back pain     hx buldging disc  . Rash     on neck and started after she started taking her beta blocker 34yrs ago;medical MD has seen this  . Gastric ulcer   . Hemorrhoids   . Family hx of colon cancer   . Diabetes mellitus     glimepiride  . Yeast infection of the vagina     being treated for this now by  dr.fontaine  . Bronchitis, acute 03-01-11    FINSHED Z-PAK-STILL ON PREDNISONE NOW  . Peripheral edema     takes Furosemide every other day  . Dizziness     occasionally  . Constipation     takes Colace prn  . Colon cancer ([place]) 2007    s/p surgery and chemo  . Urinary frequency   . Nocturia   . Anxiety     and panic attacks;pt states that she is claustrophobic  . Family history of anesthesia complication     daughter gets PONV  . Hypertension     takes Micardis and Diltiazem daily  . Atrial fibrillation ([place])     takes Coumadin daily-instructed to stop  . Spinal headache   . Numbness     left hand  . Fibromyalgia     but doesn't take any meds  . GERD (gastroesophageal reflux disease)     uses Tums prn  . History of colon polyps   . Urinary frequency   . Peripheral edema     takes Lasix daily  . History of shingles    Past Surgical History  Procedure Laterality Date  . Caesarean section  1966/69/72    x 3  . Heel spur surgery  1992    x 2  . Ankle surgery      tumor-benign  removed left ankle  . Lumbar disc surgery  2008  . Pyelonidal cystectomy      at age 39  . Knee arthrosocpy      bil;couple of years before knee replacement  . Ganglion cyst excision  > 58yrs ago    removed from left pointer finger  . Cataract surgery  2010  . Port a cath placed  2008  . Port a cath removed  2008  . Knee surgery      left TKA  . Anterior cervical decomp/discectomy fusion  01/12/2011    Procedure: ANTERIOR CERVICAL DECOMPRESSION/DISCECTOMY FUSION 2 LEVELS;  Surgeon: Cooper Render Pool;  Location: [facility];  Service: Neurosurgery;  Laterality: Bilateral;  Cervical five-six, six-seven anterior cervical discectomy and fusion with allograft and plating  . Hysteroscopy w/d&c  02/19/2011    Procedure: DILATATION AND CURETTAGE /HYSTEROSCOPY;  Surgeon: Anastasio Auerbach, MD;  Location: [facility];  Service: Gynecology;  Laterality: N/A;  requests one hour  . Dilation and curettage of  uterus  02-19-11    & POLYP REMOVAL  . Anterior cervical decomp/discectomy fusion  06/11/2011    Procedure: ANTERIOR CERVICAL DECOMPRESSION/DISCECTOMY FUSION 1 LEVEL/HARDWARE REMOVAL;  Surgeon: Charlie Pitter, MD;  Location: [facility];  Service: Neurosurgery;  Laterality: N/A;  Cervical four-five anterior cervical decompression fusion with allograft and plating; Removal of Cervical five to seven plate  . Joint replacement Left     knee  . Colectomy  2007    colon cancer  . Cardiac catheterization  90's and 2005  . Tubal ligation  1972  . Colonoscopy    . Bilateral cataracts removed    . Shoulder arthroscopy with subacromial decompression and open rotator c Right 03/16/2013    Procedure: RIGHT SHOULDER ARTHROSCOPY WITH SUBACROMIAL DECOMPRESSION AND OPEN ROTATOR CUFF REPAIR, OPEN DISTAL CLAVICLE  RESECTION POSSIBLE BICEP TENODESIS ;  Surgeon: Augustin Schooling, MD;  Location: [facility];  Service: Orthopedics;  Laterality: Right;  . Carpal tunnel release Left 11/2013  . Trigger finger release Left     thumb, tendonitis, tumor removed   Family History  Problem Relation Age of Onset  . Coronary artery disease Other   . Heart attack Mother   . Stroke Mother   . Hypertension Mother   . Heart disease Father   . Hypertension Sister   . Kidney cancer Sister     left  . Bladder Cancer Brother   . Liver cancer Brother     spread to colon  . Anesthesia problems Neg Hx   . Hypotension Neg Hx   . Malignant hyperthermia Neg Hx   . Pseudochol deficiency Neg Hx    Social History  Substance Use Topics  . Smoking status: Former Smoker -- 1.50 packs/day for 30 years    Types: Cigarettes    Quit date: 02/23/1991  . Smokeless tobacco: Never Used     Comment: quit smoking 86yrs ago  . Alcohol Use: No   OB History    Gravida Para Term Preterm AB TAB SAB Ectopic Multiple Living   3 3        3      Review of Systems  Constitutional: Negative for fever and appetite change.  HENT: Negative for  congestion.   Eyes: Negative for visual disturbance.  Respiratory: Positive for shortness of breath.   Cardiovascular: Positive for palpitations. Negative for chest pain.  Gastrointestinal: Negative for vomiting and abdominal pain.  Genitourinary: Negative for dysuria.  Musculoskeletal: Negative for back  pain.  Skin: Negative for rash.  Neurological: Negative for dizziness, weakness and light-headedness.  Psychiatric/Behavioral: Negative for behavioral problems and confusion.  All other systems reviewed and are negative.     Allergies  Benazepril; Adhesive; Morphine; Nifedipine; and Piroxicam  Home Medications   Prior to Admission medications   Medication Sig Start Date End Date Taking? Authorizing Provider  albuterol (PROVENTIL HFA;VENTOLIN HFA) 108 (90 BASE) MCG/ACT inhaler Inhale 2 puffs into the lungs every 6 (six) hours as needed for wheezing or shortness of breath.   Yes Historical Provider, MD  ALPRAZolam Duanne Moron) 0.5 MG tablet Take 1 tablet (0.5 mg total) by mouth 3 (three) times daily as needed for sleep or anxiety. Take 1 tab every 6hrs prn Patient taking differently: Take 0.5 mg by mouth 3 (three) times daily as needed for sleep or anxiety.  06/28/11  Yes Janith Lima, MD  diltiazem (CARDIZEM CD) 240 MG 24 hr capsule TAKE ONE CAPSULE BY MOUTH EVERY DAY 08/08/14  Yes Fay Records, MD  furosemide (LASIX) 40 MG tablet Take 40 mg by mouth daily.    Yes Historical Provider, MD  glimepiride (AMARYL) 4 MG tablet TAKE 1 TABLET (4 MG TOTAL) BY MOUTH DAILY WITH BREAKFAST. 11/22/14  Yes Hoyt Koch, MD  pantoprazole (PROTONIX) 40 MG tablet Take 1 tablet (40 mg total) by mouth daily. 09/30/14  Yes Amy S Esterwood, PA-C  traMADol (ULTRAM) 50 MG tablet Take 50 mg by mouth. Every 4 to 6 hours as needed for pain   Yes Historical Provider, MD  valsartan (DIOVAN) 160 MG tablet Take 160 mg by mouth daily. 05/02/14  Yes Historical Provider, MD  ONE TOUCH ULTRA TEST test strip TEST ONCE DAILY  11/18/14   Hoyt Koch, MD   BP 126/68 mmHg  Pulse 78  Temp(Src) 98 F (36.7 C)  Resp 16  SpO2 95%  LMP 12/30/2000 Physical Exam  Constitutional: She is oriented to person, place, and time. She appears well-developed and well-nourished. No distress.  Morbidly obese  HENT:  Head: Normocephalic and atraumatic.  Mouth/Throat: Oropharynx is clear and moist.  Eyes: Conjunctivae and EOM are normal. Pupils are equal, round, and reactive to light.  Neck: Normal range of motion. No JVD present.  Cardiovascular: Intact distal pulses.   Tachycardic, irregularly irregular  Pulmonary/Chest: Effort normal and breath sounds normal. No respiratory distress. She has no wheezes. She exhibits no tenderness.  Abdominal: Soft. She exhibits no distension. There is no tenderness. There is no rebound and no guarding.  Musculoskeletal: Normal range of motion.  Neurological: She is alert and oriented to person, place, and time.  Skin: Skin is warm. No pallor.  Psychiatric: She has a normal mood and affect.  Nursing note and vitals reviewed.   ED Course  Procedures (including critical care time) Labs Review Labs Reviewed  BASIC METABOLIC PANEL - Abnormal; Notable for the following:    Glucose, Bld 212 (*)    GFR calc non Af Amer 55 (*)    All other components within normal limits  CBC - Abnormal; Notable for the following:    RBC 5.18 (*)    All other components within normal limits    Imaging Review Dg Chest 2 View  01/24/2015  CLINICAL DATA:  Dyspnea.  Atrial fibrillation. EXAM: CHEST  2 VIEW COMPARISON:  03/08/2013 FINDINGS: The heart size and mediastinal contours are within normal limits. Both lungs are clear. The visualized skeletal structures are unremarkable. IMPRESSION: No active cardiopulmonary disease. Electronically Signed  By: Andreas Newport M.D.   On: 01/24/2015 23:20   I have personally reviewed and evaluated these images and lab results as part of my medical  decision-making.   EKG Interpretation   Date/Time:  Friday January 24 2015 21:49:27 EST Ventricular Rate:  141 PR Interval:    QRS Duration: 75 QT Interval:  311 QTC Calculation: 476 R Axis:   -42 Text Interpretation:  Atrial fibrillation Left axis deviation SINCE LAST  TRACING HEART RATE HAS INCREASED Confirmed by Winfred Leeds  MD, SAM [id number])  on 01/24/2015 9:59:13 PM    EKG Interpretation  Date/Time:  Friday January 24 2015 23:39:51 EST Ventricular Rate:  81 PR Interval:  199 QRS Duration: 83 QT Interval:  379 QTC Calculation: 440 R Axis:   -142 Text Interpretation:  Sinus rhythm Right axis deviation Low voltage, precordial leads Atrial fibrillation has resolved Confirmed by Winfred Leeds  MD, SAM [phone]) on 01/24/2015 11:48:02 PM        MDM   Final diagnoses:  Paroxysmal atrial fibrillation ([place])   Patient is a 74 year old female with a past medical history significant for paroxysmal A. fib (not on anticoagulation or aspirin), who presents to the emergency department with her palpitations. No acute distress, not ill appearing. Afebrile. Tachycardic in the 140s, hemodynamically stable. Exam as above, notable for no acute distress, lungs clear to auscultation bilaterally, irregularly irregular, intact distal pulses.  EKG showed A. fib RVR, rate 141, no chamber enlargement, no signs of ischemia. Basic labs obtained, unremarkable. Given Diltiazem 20 mg.  Patient converted to NSR. Repeat EKG shows normal sinus rhythm, rate 81, normal intervals, no signs of chamber enlargement, no signs of ischemia. Patient reports that her palpitations has resolved.  24:00 Patient's care transferred to Dr. Sabra Heck, currently reevaluation is pending. Please see his note for reevaluation, treatment, disposition of the patient.    Nathaniel Man, MD 01/25/15 [id number]  Orlie Dakin, MD 01/28/15 1500

## 2015-01-24 NOTE — ED Notes (Signed)
Per EMS pt has hx of Afib; pt felt heart flutter throughout the day and it increased around 1900; Pt had SOB and was placed on 4 Liters O2; Pt heart rate 130-150; No meds given in route; pt denies chest pain; pt has hx of DM and HTN and asthma; pt is a&ox 4 on arrival

## 2015-01-24 NOTE — ED Provider Notes (Signed)
Complains of fluttering in her heart almost daily. She said she had slight fluttering starting yesterday but became much worse this afternoon. She denies noncompliance with her medications. Admits to mild shortness of breath but not presently No other associated symptoms. On exam no distress lungs clear auscultation heart tachycardic irregularly irregular abdomen obese, nontender extremities without edema. Vagal maneuvers attempted, without change in rhythm or rate  Orlie Dakin, MD 01/25/15 0050

## 2015-01-24 NOTE — Telephone Encounter (Signed)
Fup

## 2015-01-24 NOTE — ED Notes (Signed)
Patient transported to X-ray 

## 2015-01-25 NOTE — ED Notes (Signed)
Resident at bedside.  

## 2015-01-25 NOTE — ED Provider Notes (Signed)
The patient has been requesting discharge, she has been in normal sinus rhythm for over an hour, she does not want to stay any longer as she has to get her husband home. At this time she does appear stable for discharge, she states she will call her cardiologist to make an arrangement for follow-up on Monday. She is aware of the reasons to return  BP stable, Labs reviewed  Noemi Chapel, MD 01/25/15 [phone]

## 2015-01-25 NOTE — Discharge Instructions (Signed)

## 2015-01-27 NOTE — Telephone Encounter (Signed)
fup for AWV but was in ER on 12/2/ will hold for now

## 2015-02-03 NOTE — Telephone Encounter (Signed)
This patient was in ER 12/2 and fup call to schedule AWV; Atrial fib resolved in ER; No fup apt scheduled at this time; LVM to call back to schedule AWV this year if possible

## 2015-02-06 ENCOUNTER — Other Ambulatory Visit: Payer: Self-pay | Admitting: Internal Medicine

## 2015-02-12 ENCOUNTER — Ambulatory Visit: Payer: Medicare Other | Admitting: Family

## 2015-02-25 ENCOUNTER — Telehealth: Payer: Self-pay

## 2015-02-25 NOTE — Telephone Encounter (Signed)
Call to the patient for AWV; to come in early at 10:30 for AWV and fup with Dr. Sharlet Salina at 11:15

## 2015-02-27 ENCOUNTER — Telehealth: Payer: Self-pay

## 2015-02-27 NOTE — Telephone Encounter (Signed)
Advised patient that [name] is out of office and will not be able to see her for AWV tomorrow (1/6), but still come for CPE with PCP at normal time

## 2015-02-28 ENCOUNTER — Encounter: Payer: Self-pay | Admitting: Internal Medicine

## 2015-02-28 ENCOUNTER — Ambulatory Visit (INDEPENDENT_AMBULATORY_CARE_PROVIDER_SITE_OTHER): Payer: Medicare Other | Admitting: Internal Medicine

## 2015-02-28 ENCOUNTER — Other Ambulatory Visit (INDEPENDENT_AMBULATORY_CARE_PROVIDER_SITE_OTHER): Payer: Medicare Other

## 2015-02-28 VITALS — BP 136/82 | HR 75 | Temp 98.0°F | Resp 20 | Ht 63.0 in | Wt 244.0 lb

## 2015-02-28 DIAGNOSIS — E119 Type 2 diabetes mellitus without complications: Secondary | ICD-10-CM

## 2015-02-28 DIAGNOSIS — I10 Essential (primary) hypertension: Secondary | ICD-10-CM

## 2015-02-28 DIAGNOSIS — E785 Hyperlipidemia, unspecified: Secondary | ICD-10-CM | POA: Diagnosis not present

## 2015-02-28 LAB — COMPREHENSIVE METABOLIC PANEL
ALT: 27 U/L (ref 0–35)
AST: 24 U/L (ref 0–37)
Albumin: 3.9 g/dL (ref 3.5–5.2)
Alkaline Phosphatase: 104 U/L (ref 39–117)
BUN: 14 mg/dL (ref 6–23)
CO2: 31 mEq/L (ref 19–32)
Calcium: 9.4 mg/dL (ref 8.4–10.5)
Chloride: 102 mEq/L (ref 96–112)
Creatinine, Ser: 1.02 mg/dL (ref 0.40–1.20)
GFR: 56.48 mL/min — ABNORMAL LOW (ref >60.00)
Glucose, Bld: 274 mg/dL — ABNORMAL HIGH (ref 70–99)
Potassium: 4.5 mEq/L (ref 3.5–5.1)
Sodium: 137 mEq/L (ref 135–145)
Total Bilirubin: 0.4 mg/dL (ref 0.2–1.2)
Total Protein: 6.4 g/dL (ref 6.0–8.3)

## 2015-02-28 LAB — LIPID PANEL
Cholesterol: 196 mg/dL (ref 0–200)
HDL: 52.9 mg/dL (ref >39.00)
NonHDL: 143.05
Total CHOL/HDL Ratio: 4
Triglycerides: 220 mg/dL — ABNORMAL HIGH (ref 0.0–149.0)
VLDL: 44 mg/dL — ABNORMAL HIGH (ref 0.0–40.0)

## 2015-02-28 LAB — HEMOGLOBIN A1C: Hgb A1c MFr Bld: 7.5 % — ABNORMAL HIGH (ref 4.6–6.5)

## 2015-02-28 LAB — LDL CHOLESTEROL, DIRECT: Direct LDL: 119 mg/dL

## 2015-02-28 MED ORDER — GLUCOSE BLOOD VI STRP: 1.0000 | ORAL_STRIP | Freq: Two times a day (BID) | Status: DC | PRN

## 2015-02-28 MED ORDER — LOSARTAN POTASSIUM 100 MG PO TABS: 100.0000 mg | ORAL_TABLET | Freq: Every day | ORAL | Status: DC

## 2015-02-28 MED ORDER — TRAMADOL HCL 50 MG PO TABS: 50.0000 mg | ORAL_TABLET | Freq: Three times a day (TID) | ORAL | Status: DC | PRN

## 2015-02-28 NOTE — Progress Notes (Signed)
   Subjective:    Patient ID: [name], female    DOB: [birth year], 75 y.o.   MRN: [id number]  HPI The patient is a 75 YO female coming in for follow up on her diabetes. She is still taking her medicines and no side effects. Denies burning in her feet, gets yearly eye exam. Denies hypoglycemia. No new complaints.   Review of Systems  Constitutional: Negative for fever, activity change, appetite change, fatigue and unexpected weight change.  HENT: Negative.   Eyes: Negative.   Respiratory: Negative for cough, chest tightness, shortness of breath and wheezing.   Cardiovascular: Negative for chest pain, palpitations and leg swelling.  Gastrointestinal: Negative for nausea, vomiting, abdominal pain, diarrhea, constipation, blood in stool and abdominal distention.  Musculoskeletal: Negative for myalgias, back pain, arthralgias and gait problem.  Skin: Negative.   Neurological: Negative.       Objective:   Physical Exam  Constitutional: She is oriented to person, place, and time. She appears well-developed and well-nourished.  HENT:  Head: Normocephalic and atraumatic.  Eyes: EOM are normal.  Neck: Normal range of motion.  Cardiovascular: Normal rate and regular rhythm.   Pulmonary/Chest: Effort normal and breath sounds normal. No respiratory distress. She has no wheezes. She has no rales.  Abdominal: Soft. Bowel sounds are normal. She exhibits no distension. There is no tenderness. There is no rebound.  Neurological: She is alert and oriented to person, place, and time. Coordination normal.  Skin: Skin is warm and dry.   Filed Vitals:   02/28/15 1132  BP: 136/82  Pulse: 75  Temp: 98 F (36.7 C)  TempSrc: Oral  Resp: 20  Height: 5\' 3"  (1.6 m)  Weight: 244 lb (110.678 kg)  SpO2: 97%      Assessment & Plan:

## 2015-02-28 NOTE — Assessment & Plan Note (Signed)
BP at goal on lasix and losartan, checking CMP and adjust as needed. Also on diltiazem.

## 2015-02-28 NOTE — Progress Notes (Signed)
Pre visit review using our clinic review tool, if applicable. No additional management support is needed unless otherwise documented below in the visit note. 

## 2015-02-28 NOTE — Assessment & Plan Note (Signed)
Checking HgA1c, diabetes at goal and not complicated. Will change valsartan to losartan for better insurance coverage. Continue amaryl, no hypoglycemia. Reminded about yearly eye exam. Adjust as needed.

## 2015-02-28 NOTE — Patient Instructions (Signed)
We will check the labs today and call you back with the results.   We have sent in losartan which is a once a day medicine to replace the valsartan when you run out.   We have sent in the tramadol to the pharmacy.

## 2015-03-08 ENCOUNTER — Emergency Department (HOSPITAL_COMMUNITY)
Admission: EM | Admit: 2015-03-08 | Discharge: 2015-03-08 | Disposition: A | Discharge status: Home / Self Care | Payer: Medicare Other | Attending: Emergency Medicine | Admitting: Emergency Medicine

## 2015-03-08 ENCOUNTER — Encounter (HOSPITAL_COMMUNITY): Payer: Self-pay | Admitting: Emergency Medicine

## 2015-03-08 DIAGNOSIS — I10 Essential (primary) hypertension: Secondary | ICD-10-CM | POA: Insufficient documentation

## 2015-03-08 DIAGNOSIS — J45901 Unspecified asthma with (acute) exacerbation: Secondary | ICD-10-CM | POA: Insufficient documentation

## 2015-03-08 DIAGNOSIS — Z7984 Long term (current) use of oral hypoglycemic drugs: Secondary | ICD-10-CM | POA: Insufficient documentation

## 2015-03-08 DIAGNOSIS — Z86718 Personal history of other venous thrombosis and embolism: Secondary | ICD-10-CM | POA: Insufficient documentation

## 2015-03-08 DIAGNOSIS — F410 Panic disorder [episodic paroxysmal anxiety] without agoraphobia: Secondary | ICD-10-CM | POA: Insufficient documentation

## 2015-03-08 DIAGNOSIS — Z9889 Other specified postprocedural states: Secondary | ICD-10-CM | POA: Insufficient documentation

## 2015-03-08 DIAGNOSIS — Z85038 Personal history of other malignant neoplasm of large intestine: Secondary | ICD-10-CM | POA: Diagnosis not present

## 2015-03-08 DIAGNOSIS — K5900 Constipation, unspecified: Secondary | ICD-10-CM | POA: Diagnosis not present

## 2015-03-08 DIAGNOSIS — Z8619 Personal history of other infectious and parasitic diseases: Secondary | ICD-10-CM | POA: Diagnosis not present

## 2015-03-08 DIAGNOSIS — I4891 Unspecified atrial fibrillation: Secondary | ICD-10-CM | POA: Insufficient documentation

## 2015-03-08 DIAGNOSIS — Z87891 Personal history of nicotine dependence: Secondary | ICD-10-CM | POA: Diagnosis not present

## 2015-03-08 DIAGNOSIS — R002 Palpitations: Secondary | ICD-10-CM | POA: Diagnosis present

## 2015-03-08 DIAGNOSIS — K219 Gastro-esophageal reflux disease without esophagitis: Secondary | ICD-10-CM | POA: Diagnosis not present

## 2015-03-08 DIAGNOSIS — E119 Type 2 diabetes mellitus without complications: Secondary | ICD-10-CM | POA: Diagnosis not present

## 2015-03-08 DIAGNOSIS — Z79899 Other long term (current) drug therapy: Secondary | ICD-10-CM | POA: Insufficient documentation

## 2015-03-08 DIAGNOSIS — Z86010 Personal history of colonic polyps: Secondary | ICD-10-CM | POA: Diagnosis not present

## 2015-03-08 DIAGNOSIS — M109 Gout, unspecified: Secondary | ICD-10-CM | POA: Insufficient documentation

## 2015-03-08 DIAGNOSIS — I480 Paroxysmal atrial fibrillation: Secondary | ICD-10-CM | POA: Diagnosis not present

## 2015-03-08 DIAGNOSIS — Z8711 Personal history of peptic ulcer disease: Secondary | ICD-10-CM | POA: Diagnosis not present

## 2015-03-08 DIAGNOSIS — G8929 Other chronic pain: Secondary | ICD-10-CM | POA: Insufficient documentation

## 2015-03-08 LAB — CBC
HEMATOCRIT: 46.8 % — AB (ref 36.0–46.0)
HEMOGLOBIN: 15.7 g/dL — AB (ref 12.0–15.0)
MCH: 29.3 pg (ref 26.0–34.0)
MCHC: 33.5 g/dL (ref 30.0–36.0)
MCV: 87.3 fL (ref 78.0–100.0)
Platelets: 237 10*3/uL (ref 150–400)
RBC: 5.36 MIL/uL — ABNORMAL HIGH (ref 3.87–5.11)
RDW: 13.9 % (ref 11.5–15.5)
WBC: 10.4 10*3/uL (ref 4.0–10.5)

## 2015-03-08 LAB — BASIC METABOLIC PANEL
ANION GAP: 13 (ref 5–15)
BUN: 11 mg/dL (ref 6–20)
CALCIUM: 9.9 mg/dL (ref 8.9–10.3)
CO2: 25 mmol/L (ref 22–32)
Chloride: 104 mmol/L (ref 101–111)
Creatinine, Ser: 0.85 mg/dL (ref 0.44–1.00)
GFR calc Af Amer: >60 mL/min (ref >60)
GFR calc non Af Amer: >60 mL/min (ref >60)
GLUCOSE: 121 mg/dL — AB (ref 65–99)
POTASSIUM: 3.8 mmol/L (ref 3.5–5.1)
Sodium: 142 mmol/L (ref 135–145)

## 2015-03-08 NOTE — Discharge Instructions (Signed)
Recheck as needed. Talk to Dr Harrington Challenger about prescribing you something you can take when your heart races to help it slow down or convert back to regular rhythm.     Atrial Fibrillation Atrial fibrillation is a type of irregular or rapid heartbeat (arrhythmia). In atrial fibrillation, the heart quivers continuously in a chaotic pattern. This occurs when parts of the heart receive disorganized signals that make the heart unable to pump blood normally. This can increase the risk for stroke, heart failure, and other heart-related conditions. There are different types of atrial fibrillation, including:  Paroxysmal atrial fibrillation. This type starts suddenly, and it usually stops on its own shortly after it starts.  Persistent atrial fibrillation. This type often lasts longer than a week. It may stop on its own or with treatment.  Long-lasting persistent atrial fibrillation. This type lasts longer than 12 months.  Permanent atrial fibrillation. This type does not go away. Talk with your health care provider to learn about the type of atrial fibrillation that you have. CAUSES This condition is caused by some heart-related conditions or procedures, including:  A heart attack.  Coronary artery disease.  Heart failure.  Heart valve conditions.  High blood pressure.  Inflammation of the sac that surrounds the heart (pericarditis).  Heart surgery.  Certain heart rhythm disorders, such as Wolf-Parkinson-White syndrome. Other causes include:  Pneumonia.  Obstructive sleep apnea.  Blockage of an artery in the lungs (pulmonary embolism, or PE).  Lung cancer.  Chronic lung disease.  Thyroid problems, especially if the thyroid is overactive (hyperthyroidism).  Caffeine.  Excessive alcohol use or illegal drug use.  Use of some medicines, including certain decongestants and diet pills. Sometimes, the cause cannot be found. RISK FACTORS This condition is more likely to develop  in:  People who are older in age.  People who smoke.  People who have diabetes mellitus.  People who are overweight (obese).  Athletes who exercise vigorously. SYMPTOMS Symptoms of this condition include:  A feeling that your heart is beating rapidly or irregularly.  A feeling of discomfort or pain in your chest.  Shortness of breath.  Sudden light-headedness or weakness.  Getting tired easily during exercise. In some cases, there are no symptoms. DIAGNOSIS Your health care provider may be able to detect atrial fibrillation when taking your pulse. If detected, this condition may be diagnosed with:  An electrocardiogram (ECG).  A Holter monitor test that records your heartbeat patterns over a 24-hour period.  Transthoracic echocardiogram (TTE) to evaluate how blood flows through your heart.  Transesophageal echocardiogram (TEE) to view more detailed images of your heart.  A stress test.  Imaging tests, such as a CT scan or chest X-ray.  Blood tests. TREATMENT The main goals of treatment are to prevent blood clots from forming and to keep your heart beating at a normal rate and rhythm. The type of treatment that you receive depends on many factors, such as your underlying medical conditions and how you feel when you are experiencing atrial fibrillation. This condition may be treated with:  Medicine to slow down the heart rate, bring the heart's rhythm back to normal, or prevent clots from forming.  Electrical cardioversion. This is a procedure that resets your heart's rhythm by delivering a controlled, low-energy shock to the heart through your skin.  Different types of ablation, such as catheter ablation, catheter ablation with pacemaker, or surgical ablation. These procedures destroy the heart tissues that send abnormal signals. When the pacemaker is used, it  is placed under your skin to help your heart beat in a regular rhythm. HOME CARE INSTRUCTIONS  Take over-the  counter and prescription medicines only as told by your health care provider.  If your health care provider prescribed a blood-thinning medicine (anticoagulant), take it exactly as told. Taking too much blood-thinning medicine can cause bleeding. If you do not take enough blood-thinning medicine, you will not have the protection that you need against stroke and other problems.  Do not use tobacco products, including cigarettes, chewing tobacco, and e-cigarettes. If you need help quitting, ask your health care provider.  If you have obstructive sleep apnea, manage your condition as told by your health care provider.  Do not drink alcohol.  Do not drink beverages that contain caffeine, such as coffee, soda, and tea.  Maintain a healthy weight. Do not use diet pills unless your health care provider approves. Diet pills may make heart problems worse.  Follow diet instructions as told by your health care provider.  Exercise regularly as told by your health care provider.  Keep all follow-up visits as told by your health care provider. This is important. PREVENTION  Avoid drinking beverages that contain caffeine or alcohol.  Avoid certain medicines, especially medicines that are used for breathing problems.  Avoid certain herbs and herbal medicines, such as those that contain ephedra or ginseng.  Do not use illegal drugs, such as cocaine and amphetamines.  Do not smoke.  Manage your high blood pressure. SEEK MEDICAL CARE IF:  You notice a change in the rate, rhythm, or strength of your heartbeat.  You are taking an anticoagulant and you notice increased bruising.  You tire more easily when you exercise or exert yourself. SEEK IMMEDIATE MEDICAL CARE IF:  You have chest pain, abdominal pain, sweating, or weakness.  You feel nauseous.  You notice blood in your vomit, bowel movement, or urine.  You have shortness of breath.  You suddenly have swollen feet and ankles.  You  feel dizzy.  You have sudden weakness or numbness of the face, arm, or leg, especially on one side of the body.  You have trouble speaking, trouble understanding, or both (aphasia).  Your face or your eyelid droops on one side. These symptoms may represent a serious problem that is an emergency. Do not wait to see if the symptoms will go away. Get medical help right away. Call your local emergency services (911 in the [place] Do not drive yourself to the hospital.   This information is not intended to replace advice given to you by your health care provider. Make sure you discuss any questions you have with your health care provider.   Document Released: 02/08/2005 Document Revised: 10/30/2014 Document Reviewed: 06/05/2014 Elsevier Interactive Patient Education [employer].

## 2015-03-08 NOTE — ED Provider Notes (Signed)
CSN: [id number]     Arrival date & time 03/08/15  0056 History  By signing my name below, I, Randa Evens, attest that this documentation has been prepared under the direction and in the presence of Rolland Porter, MD at [phone]. Electronically Signed: Randa Evens, ED Scribe. 03/08/2015. 3:49 AM.    Chief Complaint  Patient presents with  . Palpitations  . Tachycardia  . Hypertension    Patient is a 75 y.o. female presenting with palpitations and hypertension. The history is provided by the patient. No language interpreter was used.  Palpitations Associated symptoms: no chest pain, no diaphoresis, no nausea and no vomiting   Hypertension Pertinent negatives include no chest pain.   HPI Comments: MILLIAN VESTAL is a 75 y.o. female who presents to the Emergency Department complaining of palpitations onset tonight around 11 PM. Pt states she has a Hx of A-Fib with occasional exacerbations. This is her 3rd episode in the past couple of months. She states the first episode she was cardioverted in the ED and states "I will never have that done again". She states she had burns on her chest and back she had to deal with for several days. The second episode she was given IV meds and converted. She states that she normally goes into the A-Fib when her BP is elevated. Pt states that she tried 2 325mg  Asprin at home. She reports feeling her palpitations have resolved since she was brought back from triage. Pt states that she has been complaint with her diltiazem. Pt reports HX of URI and sinus infection that she has been taking antibiotics for. Pt reports that she has been dealing with chest discomfort and SOB for the past 2 weeks from the infections. Pt denies CP, diaphoresis, nausea or vomiting.    PCP Dr Sharlet Salina Cardiology Dr Harrington Challenger  Past Medical History  Diagnosis Date  . Allergic rhinitis   . Asthma   . Gout     takes Colchicine prn  . Peptic ulcer disease   . History of PSVT (paroxysmal  supraventricular tachycardia)   . HLD (hyperlipidemia)     pt doesn't take any medication for this  . DJD (degenerative joint disease)   . History of blood clots     right lung in early 90's  . Sleep apnea     doesn't use CPAP  . Shortness of breath     pt states that she can be sitting as well as exertion and get short of breath  . Bronchitis     in Jan 2013-uses Albuterol prn  . History of migraine headaches     last migraine 66yrs ago;Pt states she does get sinus headaches  . Osteoarthritis   . Chronic back pain     hx buldging disc  . Rash     on neck and started after she started taking her beta blocker 62yrs ago;medical MD has seen this  . Gastric ulcer   . Hemorrhoids   . Family hx of colon cancer   . Diabetes mellitus     glimepiride  . Yeast infection of the vagina     being treated for this now by dr.fontaine  . Bronchitis, acute 03-01-11    FINSHED Z-PAK-STILL ON PREDNISONE NOW  . Peripheral edema     takes Furosemide every other day  . Dizziness     occasionally  . Constipation     takes Colace prn  . Colon cancer [facility]) 2007    s/p surgery  and chemo  . Urinary frequency   . Nocturia   . Anxiety     and panic attacks;pt states that she is claustrophobic  . Family history of anesthesia complication     daughter gets PONV  . Hypertension     takes Micardis and Diltiazem daily  . Atrial fibrillation ([place])     takes Coumadin daily-instructed to stop  . Spinal headache   . Numbness     left hand  . Fibromyalgia     but doesn't take any meds  . GERD (gastroesophageal reflux disease)     uses Tums prn  . History of colon polyps   . Urinary frequency   . Peripheral edema     takes Lasix daily  . History of shingles    Past Surgical History  Procedure Laterality Date  . Caesarean section  1966/69/72    x 3  . Heel spur surgery  1992    x 2  . Ankle surgery      tumor-benign removed left ankle  . Lumbar disc surgery  2008  . Pyelonidal cystectomy       at age 70  . Knee arthrosocpy      bil;couple of years before knee replacement  . Ganglion cyst excision  > 109yrs ago    removed from left pointer finger  . Cataract surgery  2010  . Port a cath placed  2008  . Port a cath removed  2008  . Knee surgery      left TKA  . Anterior cervical decomp/discectomy fusion  01/12/2011    Procedure: ANTERIOR CERVICAL DECOMPRESSION/DISCECTOMY FUSION 2 LEVELS;  Surgeon: Cooper Render Pool;  Location: [place] [facility];  Service: Neurosurgery;  Laterality: Bilateral;  Cervical five-six, six-seven anterior cervical discectomy and fusion with allograft and plating  . Hysteroscopy w/d&c  02/19/2011    Procedure: DILATATION AND CURETTAGE /HYSTEROSCOPY;  Surgeon: Anastasio Auerbach, MD;  Location: [place] [facility];  Service: Gynecology;  Laterality: N/A;  requests one hour  . Dilation and curettage of uterus  02-19-11    & POLYP REMOVAL  . Anterior cervical decomp/discectomy fusion  06/11/2011    Procedure: ANTERIOR CERVICAL DECOMPRESSION/DISCECTOMY FUSION 1 LEVEL/HARDWARE REMOVAL;  Surgeon: Charlie Pitter, MD;  Location: [place] [facility];  Service: Neurosurgery;  Laterality: N/A;  Cervical four-five anterior cervical decompression fusion with allograft and plating; Removal of Cervical five to seven plate  . Joint replacement Left     knee  . Colectomy  2007    colon cancer  . Cardiac catheterization  90's and 2005  . Tubal ligation  1972  . Colonoscopy    . Bilateral cataracts removed    . Shoulder arthroscopy with subacromial decompression and open rotator c Right 03/16/2013    Procedure: RIGHT SHOULDER ARTHROSCOPY WITH SUBACROMIAL DECOMPRESSION AND OPEN ROTATOR CUFF REPAIR, OPEN DISTAL CLAVICLE  RESECTION POSSIBLE BICEP TENODESIS ;  Surgeon: Augustin Schooling, MD;  Location: [place];  Service: Orthopedics;  Laterality: Right;  . Carpal tunnel release Left 11/2013  . Trigger finger release Left     thumb, tendonitis, tumor removed   Family History  Problem Relation Age of Onset  .  Coronary artery disease Other   . Heart attack Mother   . Stroke Mother   . Hypertension Mother   . Heart disease Father   . Hypertension Sister   . Kidney cancer Sister     left  . Bladder Cancer Brother   . Liver cancer Brother  spread to colon  . Anesthesia problems Neg Hx   . Hypotension Neg Hx   . Malignant hyperthermia Neg Hx   . Pseudochol deficiency Neg Hx    Social History  Substance Use Topics  . Smoking status: Former Smoker -- 1.50 packs/day for 30 years    Types: Cigarettes    Quit date: 02/23/1991  . Smokeless tobacco: Never Used     Comment: quit smoking 92yrs ago  . Alcohol Use: No   Lives at home Lives with husband  OB History    Gravida Para Term Preterm AB TAB SAB Ectopic Multiple Living   3 3        3      Review of Systems  Constitutional: Negative for diaphoresis.  Cardiovascular: Positive for palpitations. Negative for chest pain.  Gastrointestinal: Negative for nausea and vomiting.  All other systems reviewed and are negative.     Allergies  Benazepril; Adhesive; Morphine; Nifedipine; and Piroxicam  Home Medications   Prior to Admission medications   Medication Sig Start Date End Date Taking? Authorizing Provider  albuterol (PROVENTIL HFA;VENTOLIN HFA) 108 (90 BASE) MCG/ACT inhaler Inhale 2 puffs into the lungs every 6 (six) hours as needed for wheezing or shortness of breath.   Yes Historical Provider, MD  ALPRAZolam Duanne Moron) 0.5 MG tablet Take 1 tablet (0.5 mg total) by mouth 3 (three) times daily as needed for sleep or anxiety. Take 1 tab every 6hrs prn Patient taking differently: Take 0.5 mg by mouth 3 (three) times daily as needed for sleep or anxiety.  06/28/11  Yes Janith Lima, MD  amoxicillin-clavulanate (AUGMENTIN) 500-125 MG tablet Take 1 tablet by mouth 2 (two) times daily. 03/02/15 03/09/15 Yes Historical Provider, MD  diltiazem (CARDIZEM CD) 240 MG 24 hr capsule TAKE ONE CAPSULE BY MOUTH EVERY DAY 02/06/15  Yes Fay Records, MD   furosemide (LASIX) 40 MG tablet Take 40 mg by mouth daily.    Yes Historical Provider, MD  glimepiride (AMARYL) 4 MG tablet TAKE 1 TABLET (4 MG TOTAL) BY MOUTH DAILY WITH BREAKFAST. 11/22/14  Yes Hoyt Koch, MD  glucose blood (ONE TOUCH ULTRA TEST) test strip 1 each by Other route 2 (two) times daily as needed for other. for testing 02/28/15  Yes Hoyt Koch, MD  pantoprazole (PROTONIX) 40 MG tablet Take 1 tablet (40 mg total) by mouth daily. 09/30/14  Yes Amy S Esterwood, PA-C  traMADol (ULTRAM) 50 MG tablet Take 1 tablet (50 mg total) by mouth every 8 (eight) hours as needed. Patient taking differently: Take 50 mg by mouth every 8 (eight) hours as needed for moderate pain.  02/28/15  Yes Hoyt Koch, MD  valsartan (DIOVAN) 160 MG tablet Take 160 mg by mouth daily. 03/08/15  Yes Historical Provider, MD  losartan (COZAAR) 100 MG tablet Take 1 tablet (100 mg total) by mouth daily. 02/28/15   Hoyt Koch, MD   ED Triage Vitals  Enc Vitals Group     BP 03/08/15 0109 165/106 mmHg     Pulse Rate 03/08/15 0109 145     Resp 03/08/15 0109 18     Temp 03/08/15 0109 98 F (36.7 C)     Temp Source 03/08/15 0109 Oral     SpO2 03/08/15 0109 98 %     Weight --      Height --      Head Cir --      Peak Flow --      Pain  Score 03/08/15 0119 0     Pain Loc --      Pain Edu? --      Excl. in [place]? --    Laboratory interpretation all normal except tachycardia and hypertension   Physical Exam  Constitutional: She is oriented to person, place, and time. She appears well-developed and well-nourished.  Non-toxic appearance. She does not appear ill. No distress.  HENT:  Head: Normocephalic and atraumatic.  Right Ear: External ear normal.  Left Ear: External ear normal.  Nose: Nose normal. No mucosal edema or rhinorrhea.  Mouth/Throat: Oropharynx is clear and moist and mucous membranes are normal. No dental abscesses or uvula swelling.  Eyes: Conjunctivae and EOM are normal.  Pupils are equal, round, and reactive to light.  Neck: Normal range of motion and full passive range of motion without pain. Neck supple.  Cardiovascular: Normal rate, regular rhythm and normal heart sounds.  Exam reveals no gallop and no friction rub.   No murmur heard. Pulmonary/Chest: Effort normal and breath sounds normal. No respiratory distress. She has no wheezes. She has no rhonchi. She has no rales. She exhibits no tenderness and no crepitus.  Abdominal: Soft. Normal appearance and bowel sounds are normal. She exhibits no distension. There is no tenderness. There is no rebound and no guarding.  Musculoskeletal: Normal range of motion. She exhibits no edema or tenderness.  Moves all extremities well.   Neurological: She is alert and oriented to person, place, and time. She has normal strength. No cranial nerve deficit.  Skin: Skin is warm, dry and intact. No rash noted. No erythema. No pallor.  Psychiatric: She has a normal mood and affect. Her speech is normal and behavior is normal. Her mood appears not anxious.  Nursing note and vitals reviewed.   ED Course  Procedures (including critical care time) DIAGNOSTIC STUDIES: Oxygen Saturation is 98% on RA, normal by my interpretation.    COORDINATION OF CARE: 2:59 AM-Discussed treatment plan with pt at bedside and pt agreed to plan.   Pt spontaneously converted to NSR from the time she was in triage to when she was placed on a stretcher. Will monitor to make sure she doesn't go back into afib.   Pt was monitored and remained in NSR at time of discharge at 04:00.  Labs Review Results for orders placed or performed during the hospital encounter of [id number]  Basic metabolic panel  Result Value Ref Range   Sodium 142 135 - 145 mmol/L   Potassium 3.8 3.5 - 5.1 mmol/L   Chloride 104 101 - 111 mmol/L   CO2 25 22 - 32 mmol/L   Glucose, Bld 121 (H) 65 - 99 mg/dL   BUN 11 6 - 20 mg/dL   Creatinine, Ser 0.85 0.44 - 1.00 mg/dL   Calcium  9.9 8.9 - 10.3 mg/dL   GFR calc non Af Amer >60 >60 mL/min   GFR calc Af Amer >60 >60 mL/min   Anion gap 13 5 - 15  CBC  Result Value Ref Range   WBC 10.4 4.0 - 10.5 K/uL   RBC 5.36 (H) 3.87 - 5.11 MIL/uL   Hemoglobin 15.7 (H) 12.0 - 15.0 g/dL   HCT 46.8 (H) 36.0 - 46.0 %   MCV 87.3 78.0 - 100.0 fL   MCH 29.3 26.0 - 34.0 pg   MCHC 33.5 30.0 - 36.0 g/dL   RDW 13.9 11.5 - 15.5 %   Platelets 237 150 - 400 K/uL   Laboratory interpretation all  normal except concentrated Hb     EKG Interpretation   Date/Time:  Saturday March 08 2015 01:09:07 EST Ventricular Rate:  145 PR Interval:    QRS Duration: 76 QT Interval:  296 QTC Calculation: 459 R Axis:   -26 Text Interpretation:  Atrial fibrillation with rapid ventricular response  Nonspecific ST abnormality Since last tracing 24 Jan 2015 Atrial  fibrillation has replaced Normal sinus rhythm Confirmed by Lacey Wallman  MD-I,  Jadia Capers ([id number]) on 03/08/2015 2:47:13 AM      MDM   Final diagnoses:  Atrial fibrillation with rapid ventricular response ([place])   Plan discharge  Rolland Porter, MD, FACEP    I personally performed the services described in this documentation, which was scribed in my presence. The recorded information has been reviewed and considered.  Rolland Porter, MD, Barbette Or, MD 03/08/15 [phone]

## 2015-03-08 NOTE — ED Notes (Signed)
Patient here with complaint of chest palpitations. States onset tonight at approximately 2330. Endorses history of the same. Chronically in A-fib with occasional exacerbations. States that typically her BP is very high when she has episodes. Currently taking diltiazem 240mg  QDay.

## 2015-03-10 ENCOUNTER — Telehealth: Payer: Self-pay | Admitting: Internal Medicine

## 2015-03-10 ENCOUNTER — Other Ambulatory Visit: Payer: Self-pay | Admitting: Internal Medicine

## 2015-03-10 NOTE — Telephone Encounter (Signed)
Called patient back.  She was instructed by ED to make her appt with Dr. Harrington Challenger to discuss possible medicine changes due to her recent episodes of rapid afib and elevated blood pressures.  She was in ED Friday night for feeling afib, elevated BP and SOB.  She converted back to SR without any intervention that time.  She does not take any blood thinners. "I cant.  Warfarin doesn't agree with me; couldn't function, too fatigued."  In addition pt had had respiratory issue since before thanksgiving.  Took antibiotic for 2 weeks, still with SOB, cough and sinus congestion with colored drainage----rec she f/u back with her PCP for this.  She will make f/u appt.  Scheduled patient tomorrow to see PA. She already has appt with Dr. Harrington Challenger in February.

## 2015-03-10 NOTE — Telephone Encounter (Signed)
Currently she is not feeling palpitatons.  Her BP checked on the phone was 159/83, HR 74.  She is feeling a little SOB but has been this way with her respiratory symptoms.

## 2015-03-10 NOTE — Telephone Encounter (Signed)
New Message  Patient c/o Palpitations:  High priority if patient c/o lightheadedness and shortness of breath.  1. How long have you been having palpitations? Afib for years. Went to the ER in Jan/2016 Nov/2016 and then she went recently 03/07/2015  2. Are you currently experiencing lightheadedness and shortness of breath? YES Bp was high pulse was high and she states that she was out of rhythm.  3. Have you checked your BP and heart rate? (document readings) "Like"  175/103 or 104 and the Heart Rate was 130 something to 140 (Patients words)   4. Are you experiencing any other symptoms? Fatigue and pain in left ear.

## 2015-03-11 ENCOUNTER — Ambulatory Visit (INDEPENDENT_AMBULATORY_CARE_PROVIDER_SITE_OTHER): Payer: Medicare Other | Admitting: Cardiology

## 2015-03-11 ENCOUNTER — Encounter: Payer: Self-pay | Admitting: Cardiology

## 2015-03-11 VITALS — BP 140/74 | HR 72 | Ht 63.0 in | Wt 240.8 lb

## 2015-03-11 DIAGNOSIS — I10 Essential (primary) hypertension: Secondary | ICD-10-CM | POA: Diagnosis not present

## 2015-03-11 DIAGNOSIS — I4891 Unspecified atrial fibrillation: Secondary | ICD-10-CM | POA: Diagnosis not present

## 2015-03-11 MED ORDER — METOPROLOL TARTRATE 25 MG PO TABS: 25.0000 mg | ORAL_TABLET | Freq: Two times a day (BID) | ORAL | Status: DC

## 2015-03-11 NOTE — Progress Notes (Signed)
03/11/2015 [name]   05/06/1940  [id number]  Primary Physician Hoyt Koch, MD Primary Cardiologist: Dr. Harrington Challenger   Reason for Visit/CC:  Post ED F/u for Atrial Fibrillation w/ RVR  HPI:  75 y/o female, followed by Dr. Harrington Challenger, with a h/o PAF, HTN and DM who presents to clinic for post ED f/u after being seen for atrial fibrillation w/ RVR on 03/08/15. Per notes, she converted spontaneously in the ED. She was monitored for several hours and remained in NSR. Labs showed no anemia. K was WNL. She was discharged home and instructed to f/u in our office for further evaluation. Also of note, she had a normal echo in 2012. EF was 55-60%. Despite a CHA2DS2 VASc score of at least 4 for HTN, DM, Age 56-76 and female sex, she is not on anticoagulation. Chart review shows that she was on Coumadin in the past. The patient reports that she self discontinued it due to "intolerance". She reports that she "could not function with it".  She is on Cardizem for rate control.  EKG today shows NSR. HR if 72 bpm. She is complaint with Cardizem. She notes that she continues to have intermittent palpitations at home. She also has had issues with hypertension with high readings at home. BP today is 140/74.    Current Outpatient Prescriptions  Medication Sig Dispense Refill  . albuterol (PROVENTIL HFA;VENTOLIN HFA) 108 (90 BASE) MCG/ACT inhaler Inhale 2 puffs into the lungs every 6 (six) hours as needed for wheezing or shortness of breath.    . ALPRAZolam (XANAX) 0.5 MG tablet Take 0.5 mg by mouth 3 (three) times daily as needed for anxiety or sleep.    [name] diltiazem (CARDIZEM CD) 240 MG 24 hr capsule TAKE ONE CAPSULE BY MOUTH EVERY DAY 30 capsule 0  . furosemide (LASIX) 40 MG tablet Take 40 mg by mouth daily.     [name] glimepiride (AMARYL) 4 MG tablet TAKE 1 TABLET (4 MG TOTAL) BY MOUTH DAILY WITH BREAKFAST. 90 tablet 3  . glucose blood (ONE TOUCH ULTRA TEST) test strip 1 each by Other route 2 (two) times daily as  needed for other. for testing 100 each 3  . losartan (COZAAR) 100 MG tablet Take 1 tablet (100 mg total) by mouth daily. 90 tablet 3  . pantoprazole (PROTONIX) 40 MG tablet Take 1 tablet (40 mg total) by mouth daily. 30 tablet 3  . traMADol (ULTRAM) 50 MG tablet Take 50 mg by mouth 3 (three) times daily as needed for moderate pain.    . valsartan (DIOVAN) 160 MG tablet Take 160 mg by mouth daily.    . metoprolol tartrate (LOPRESSOR) 25 MG tablet Take 1 tablet (25 mg total) by mouth 2 (two) times daily. 60 tablet 5  . [DISCONTINUED] digoxin (LANOXIN) 0.125 MG tablet Take 125 mcg by mouth daily.     No current facility-administered medications for this visit.    Allergies  Allergen Reactions  . Benazepril Other (See Comments)    coughing  . Adhesive [Tape] Other (See Comments)    redness  . Morphine Other (See Comments)       Makes you feel bad all over  . Nifedipine Hives and Itching  . Piroxicam Hives and Itching    Social History   Social History  . Marital Status: Married    Spouse Name: N/A  . Number of Children: 3  . Years of Education: N/A   Occupational History  . retired  Social History Main Topics  . Smoking status: Former Smoker -- 1.50 packs/day for 30 years    Types: Cigarettes    Quit date: 02/23/1991  . Smokeless tobacco: Never Used     Comment: quit smoking 27yrs ago  . Alcohol Use: No  . Drug Use: No  . Sexual Activity:    Partners: Male    Birth Control/ Protection: Post-menopausal   Other Topics Concern  . Not on file   Social History Narrative   Regular exercise- no.     Review of Systems: General: negative for chills, fever, night sweats or weight changes.  Cardiovascular: negative for chest pain, dyspnea on exertion, edema, orthopnea, palpitations, paroxysmal nocturnal dyspnea or shortness of breath Dermatological: negative for rash Respiratory: negative for cough or wheezing Urologic: negative for hematuria Abdominal: negative for  nausea, vomiting, diarrhea, bright red blood per rectum, melena, or hematemesis Neurologic: negative for visual changes, syncope, or dizziness All other systems reviewed and are otherwise negative except as noted above.    Blood pressure 140/74, pulse 72, height 5\' 3"  (1.6 m), weight 240 lb 12.8 oz (109.226 kg), last menstrual period 12/30/2000.  General appearance: alert, cooperative and no distress Neck: no carotid bruit and no JVD Lungs: clear to auscultation bilaterally Heart: regular rate and rhythm, S1, S2 normal, no murmur, click, rub or gallop Extremities: no LEE Pulses: 2+ and symmetric Skin: warm and dry Neurologic: Grossly normal  EKG NSR. 72 bpm  ASSESSMENT AND PLAN:   1. PAF: patient with recent afib w/ RVR but converted spontaneously. EKG today shows NSR with a HR of 74 bpm. She is compliant with Cardizem but notes frequent recurrence of irregular palpitations. Also with higher BPs at at home. We will add metoprolol, 25 mg for further suppression of ectopy, better rate and BP control. Her CHA2DS2 VASc score is 4. We discussed her risk for stroke. She was encouraged to start anticoagulation. She does not want to go back on warfarin. I suggested alternatives, NOACs, but she continues to refuse, despite her risk. She is fully competent and understands her risk w/o anticoagulation. She is thus not a candidate for rhythm control strategy. Continue rate control for PAF.   2. HTN: moderately elevated. Will add metoprolol for better BP reduction + rate control for PAF.   3. DM: followed and managed by PCP.   PLAN  Keep f/u with Dr. Harrington Challenger in several weeks.   [name] PA-C 03/11/2015 4:52 PM

## 2015-03-11 NOTE — Patient Instructions (Signed)
Medication Instructions:  Your physician has recommended you make the following change in your medication:  START Metoprolol 25mg  Twice Daily an Rx has been sen to your pharmacy   Labwork: None ordered  Testing/Procedures: None ordered  Follow-Up: Follow up as planned with Dr.Ross in February 2017  Any Other Special Instructions Will Be Listed Below (If Applicable). Your physician has requested that you regularly monitor and record your blood pressure and heartrate readings at home. Please use the same machine at the same time of day to check your readings and record them.       If you need a refill on your cardiac medications before your next appointment, please call your pharmacy.

## 2015-03-13 ENCOUNTER — Ambulatory Visit (INDEPENDENT_AMBULATORY_CARE_PROVIDER_SITE_OTHER): Payer: Medicare Other | Admitting: Internal Medicine

## 2015-03-13 ENCOUNTER — Encounter: Payer: Self-pay | Admitting: Internal Medicine

## 2015-03-13 VITALS — BP 152/82 | HR 71 | Temp 98.5°F | Resp 16 | Ht 63.0 in | Wt 241.0 lb

## 2015-03-13 DIAGNOSIS — J309 Allergic rhinitis, unspecified: Secondary | ICD-10-CM | POA: Diagnosis not present

## 2015-03-13 MED ORDER — CETIRIZINE HCL 10 MG PO TABS: 10.0000 mg | ORAL_TABLET | Freq: Every day | ORAL | Status: DC

## 2015-03-13 NOTE — Progress Notes (Signed)
Pre visit review using our clinic review tool, if applicable. No additional management support is needed unless otherwise documented below in the visit note. 

## 2015-03-13 NOTE — Patient Instructions (Signed)
We have sent in cetirizine for the sinuses to help dry them up. It can take 3-5 days to take full effect.   The losartan is a little stronger than the valsartan was so we will give it 1-2 weeks to help. Do not take the metoprolol again.   You do not need antibiotics today. The other thing you can try is a neti pot for rinsing the sinuses to keep them clean.   Allergic Rhinitis Allergic rhinitis is when the mucous membranes in the nose respond to allergens. Allergens are particles in the air that cause your body to have an allergic reaction. This causes you to release allergic antibodies. Through a chain of events, these eventually cause you to release histamine into the blood stream. Although meant to protect the body, it is this release of histamine that causes your discomfort, such as frequent sneezing, congestion, and an itchy, runny nose.  CAUSES Seasonal allergic rhinitis (hay fever) is caused by pollen allergens that may come from grasses, trees, and weeds. Year-round allergic rhinitis (perennial allergic rhinitis) is caused by allergens such as house dust mites, pet dander, and mold spores. SYMPTOMS  Nasal stuffiness (congestion).  Itchy, runny nose with sneezing and tearing of the eyes. DIAGNOSIS Your health care provider can help you determine the allergen or allergens that trigger your symptoms. If you and your health care provider are unable to determine the allergen, skin or blood testing may be used. Your health care provider will diagnose your condition after taking your health history and performing a physical exam. Your health care provider may assess you for other related conditions, such as asthma, pink eye, or an ear infection. TREATMENT Allergic rhinitis does not have a cure, but it can be controlled by:  Medicines that block allergy symptoms. These may include allergy shots, nasal sprays, and oral antihistamines.  Avoiding the allergen. Hay fever may often be treated with  antihistamines in pill or nasal spray forms. Antihistamines block the effects of histamine. There are over-the-counter medicines that may help with nasal congestion and swelling around the eyes. Check with your health care provider before taking or giving this medicine. If avoiding the allergen or the medicine prescribed do not work, there are many new medicines your health care provider can prescribe. Stronger medicine may be used if initial measures are ineffective. Desensitizing injections can be used if medicine and avoidance does not work. Desensitization is when a patient is given ongoing shots until the body becomes less sensitive to the allergen. Make sure you follow up with your health care provider if problems continue. HOME CARE INSTRUCTIONS It is not possible to completely avoid allergens, but you can reduce your symptoms by taking steps to limit your exposure to them. It helps to know exactly what you are allergic to so that you can avoid your specific triggers. SEEK MEDICAL CARE IF:  You have a fever.  You develop a cough that does not stop easily (persistent).  You have shortness of breath.  You start wheezing.  Symptoms interfere with normal daily activities.   This information is not intended to replace advice given to you by your health care provider. Make sure you discuss any questions you have with your health care provider.   Document Released: 11/03/2000 Document Revised: 03/01/2014 Document Reviewed: 10/16/2012 Elsevier Interactive Patient Education [employer].

## 2015-03-16 NOTE — Assessment & Plan Note (Signed)
Rx for zyrtec for the congestion. No indication for antibiotics and she can keep using mucinex. Talked to her about the neti pot for sinus rinses. Does not feel the need for cough syrup.

## 2015-03-16 NOTE — Progress Notes (Signed)
   Subjective:    Patient ID: [name], female    DOB: 17-Nov-1940, 75 y.o.   MRN: [id number]  HPI The patient is a 75 YO female coming in for SOB and congestion. It started about 4-5 days ago. Mild congestion and SOB. Minimal cough, no fevers or chills. No muscle aches and no known sick contacts. No trying anything for it but mucinex which seemed to help some.   Review of Systems  Constitutional: Negative for fever, chills, activity change, appetite change, fatigue and unexpected weight change.  HENT: Positive for congestion, postnasal drip and rhinorrhea. Negative for ear discharge, ear pain and sinus pressure.   Eyes: Negative.   Respiratory: Positive for cough and shortness of breath. Negative for chest tightness and wheezing.   Cardiovascular: Negative.   Gastrointestinal: Negative.   Musculoskeletal: Negative.       Objective:   Physical Exam  Constitutional: She is oriented to person, place, and time. She appears well-developed and well-nourished.  HENT:  Head: Normocephalic and atraumatic.  Oropharynx with mild redness and clear drainage, nose without crusting  Eyes: EOM are normal.  Neck: Normal range of motion.  Cardiovascular: Normal rate and regular rhythm.   Pulmonary/Chest: Effort normal and breath sounds normal. No respiratory distress. She has no wheezes. She has no rales.  Abdominal: Soft. She exhibits no distension. There is no tenderness.  Neurological: She is alert and oriented to person, place, and time.  Skin: Skin is warm and dry.   Filed Vitals:   03/13/15 1438 03/13/15 1502  BP: 170/80 152/82  Pulse: 71   Temp: 98.5 F (36.9 C)   TempSrc: Oral   Resp: 16   Height: 5\' 3"  (1.6 m)   Weight: 241 lb (109.317 kg)   SpO2: 96%       Assessment & Plan:

## 2015-04-11 ENCOUNTER — Ambulatory Visit (INDEPENDENT_AMBULATORY_CARE_PROVIDER_SITE_OTHER): Payer: Medicare Other | Admitting: Internal Medicine

## 2015-04-11 ENCOUNTER — Encounter: Payer: Self-pay | Admitting: Internal Medicine

## 2015-04-11 VITALS — BP 132/78 | HR 72 | Ht 63.0 in | Wt 241.0 lb

## 2015-04-11 DIAGNOSIS — M5137 Other intervertebral disc degeneration, lumbosacral region: Secondary | ICD-10-CM | POA: Insufficient documentation

## 2015-04-11 DIAGNOSIS — M51379 Other intervertebral disc degeneration, lumbosacral region without mention of lumbar back pain or lower extremity pain: Secondary | ICD-10-CM | POA: Insufficient documentation

## 2015-04-11 DIAGNOSIS — E785 Hyperlipidemia, unspecified: Secondary | ICD-10-CM

## 2015-04-11 DIAGNOSIS — M797 Fibromyalgia: Secondary | ICD-10-CM | POA: Insufficient documentation

## 2015-04-11 MED ORDER — ATORVASTATIN CALCIUM 10 MG PO TABS: 10.0000 mg | ORAL_TABLET | Freq: Every day | ORAL | Status: DC

## 2015-04-11 NOTE — Progress Notes (Signed)
Cardiology Office Note   Date:  04/11/2015   ID:  Chloris, Zukauskas 1940-11-11, MRN [id number]  PCP:  Hoyt Koch, MD  Cardiologist:   Dorris Carnes, MD   No chief complaint on file.     History of Present Illness: [name] is a 75 y.o. female with a history of PAF, HTN and DM who presents to clinic for post ED f/u after being seen for atrial fibrillation w/ RVR on 03/08/15. Per notes, she converted spontaneously in the ED.  She was discharged home and instructed to f/u in our office for further evaluation. Also of note, she had a normal echo in 2012. EF was 55-60%. Despite a CHA2DS2 VASc score of at least 4 for HTN, DM, Age 66-74 and female sex, she is not on anticoagulation. Chart review shows that she was on Coumadin in the past. The patient reports that she self discontinued it due to "intolerance". She reports that she "could not function with it". She is on Cardizem for rate control.  She wa last seen by B Rosita Fire in January.  Afer long discussion pt did not want anticoagulation  Since that clinic visit she has done ok  No signif palpitations  Breathing is stable  .    Current Outpatient Prescriptions  Medication Sig Dispense Refill  . albuterol (PROVENTIL HFA;VENTOLIN HFA) 108 (90 BASE) MCG/ACT inhaler Inhale 2 puffs into the lungs every 6 (six) hours as needed for wheezing or shortness of breath.    . ALPRAZolam (XANAX) 0.5 MG tablet Take 0.5 mg by mouth 3 (three) times daily as needed for anxiety or sleep.    [name] diltiazem (CARDIZEM CD) 240 MG 24 hr capsule TAKE ONE CAPSULE BY MOUTH EVERY DAY 30 capsule 0  . furosemide (LASIX) 40 MG tablet Take 40 mg by mouth daily.     [name] glimepiride (AMARYL) 4 MG tablet TAKE 1 TABLET (4 MG TOTAL) BY MOUTH DAILY WITH BREAKFAST. 90 tablet 3  . glucose blood (ONE TOUCH ULTRA TEST) test strip 1 each by Other route 2 (two) times daily as needed for other. for testing 100 each 3  . losartan (COZAAR) 100 MG tablet Take 1 tablet (100 mg  total) by mouth daily. 90 tablet 3  . pantoprazole (PROTONIX) 40 MG tablet Take 1 tablet (40 mg total) by mouth daily. 30 tablet 3  . traMADol (ULTRAM) 50 MG tablet Take 50 mg by mouth 3 (three) times daily as needed for moderate pain.    . [DISCONTINUED] digoxin (LANOXIN) 0.125 MG tablet Take 125 mcg by mouth daily.     No current facility-administered medications for this visit.    Allergies:   Benazepril; Adhesive; Morphine; Nifedipine; and Piroxicam   Past Medical History  Diagnosis Date  . Allergic rhinitis   . Asthma   . Gout     takes Colchicine prn  . Peptic ulcer disease   . History of PSVT (paroxysmal supraventricular tachycardia)   . HLD (hyperlipidemia)     pt doesn't take any medication for this  . DJD (degenerative joint disease)   . History of blood clots     right lung in early 90's  . Sleep apnea     doesn't use CPAP  . Shortness of breath     pt states that she can be sitting as well as exertion and get short of breath  . Bronchitis     in Jan 2013-uses Albuterol prn  . History of migraine headaches  last migraine 11yrs ago;Pt states she does get sinus headaches  . Osteoarthritis   . Chronic back pain     hx buldging disc  . Rash     on neck and started after she started taking her beta blocker 43yrs ago;medical MD has seen this  . Gastric ulcer   . Hemorrhoids   . Family hx of colon cancer   . Diabetes mellitus     glimepiride  . Yeast infection of the vagina     being treated for this now by dr.fontaine  . Bronchitis, acute 03-01-11    FINSHED Z-PAK-STILL ON PREDNISONE NOW  . Peripheral edema     takes Furosemide every other day  . Dizziness     occasionally  . Constipation     takes Colace prn  . Colon cancer ([place]) 2007    s/p surgery and chemo  . Urinary frequency   . Nocturia   . Anxiety     and panic attacks;pt states that she is claustrophobic  . Family history of anesthesia complication     daughter gets PONV  . Hypertension      takes Micardis and Diltiazem daily  . Atrial fibrillation ([place])     takes Coumadin daily-instructed to stop  . Spinal headache   . Numbness     left hand  . Fibromyalgia     but doesn't take any meds  . GERD (gastroesophageal reflux disease)     uses Tums prn  . History of colon polyps   . Urinary frequency   . Peripheral edema     takes Lasix daily  . History of shingles     Past Surgical History  Procedure Laterality Date  . Caesarean section  1966/69/72    x 3  . Heel spur surgery  1992    x 2  . Ankle surgery      tumor-benign removed left ankle  . Lumbar disc surgery  2008  . Pyelonidal cystectomy      at age 79  . Knee arthrosocpy      bil;couple of years before knee replacement  . Ganglion cyst excision  > 47yrs ago    removed from left pointer finger  . Cataract surgery  2010  . Port a cath placed  2008  . Port a cath removed  2008  . Knee surgery      left TKA  . Anterior cervical decomp/discectomy fusion  01/12/2011    Procedure: ANTERIOR CERVICAL DECOMPRESSION/DISCECTOMY FUSION 2 LEVELS;  Surgeon: Cooper Render Pool;  Location: [facility];  Service: Neurosurgery;  Laterality: Bilateral;  Cervical five-six, six-seven anterior cervical discectomy and fusion with allograft and plating  . Hysteroscopy w/d&c  02/19/2011    Procedure: DILATATION AND CURETTAGE /HYSTEROSCOPY;  Surgeon: Anastasio Auerbach, MD;  Location: [facility];  Service: Gynecology;  Laterality: N/A;  requests one hour  . Dilation and curettage of uterus  02-19-11    & POLYP REMOVAL  . Anterior cervical decomp/discectomy fusion  06/11/2011    Procedure: ANTERIOR CERVICAL DECOMPRESSION/DISCECTOMY FUSION 1 LEVEL/HARDWARE REMOVAL;  Surgeon: Charlie Pitter, MD;  Location: [facility];  Service: Neurosurgery;  Laterality: N/A;  Cervical four-five anterior cervical decompression fusion with allograft and plating; Removal of Cervical five to seven plate  . Joint replacement Left     knee  . Colectomy  2007     colon cancer  . Cardiac catheterization  90's and 2005  . Tubal ligation  1972  .  Colonoscopy    . Bilateral cataracts removed    . Shoulder arthroscopy with subacromial decompression and open rotator c Right 03/16/2013    Procedure: RIGHT SHOULDER ARTHROSCOPY WITH SUBACROMIAL DECOMPRESSION AND OPEN ROTATOR CUFF REPAIR, OPEN DISTAL CLAVICLE  RESECTION POSSIBLE BICEP TENODESIS ;  Surgeon: Augustin Schooling, MD;  Location: [facility];  Service: Orthopedics;  Laterality: Right;  . Carpal tunnel release Left 11/2013  . Trigger finger release Left     thumb, tendonitis, tumor removed     Social History:  The patient  reports that she quit smoking about 24 years ago. Her smoking use included Cigarettes. She has a 45 pack-year smoking history. She has never used smokeless tobacco. She reports that she does not drink alcohol or use illicit drugs.   Family History:  The patient's family history includes Bladder Cancer in her brother; Coronary artery disease in her other; Heart attack in her mother; Heart disease in her father; Hypertension in her mother and sister; Kidney cancer in her sister; Liver cancer in her brother; Stroke in her mother. There is no history of Anesthesia problems, Hypotension, Malignant hyperthermia, or Pseudochol deficiency.    ROS:  Please see the history of present illness. All other systems are reviewed and  Negative to the above problem except as noted.    PHYSICAL EXAM: VS:  BP 132/78 mmHg  Pulse 72  Ht 5\' 3"  (1.6 m)  Wt 109.317 kg (241 lb)  BMI 42.70 kg/m2  LMP 12/30/2000  GEN: Well nourished, well developed, in no acute distress HEENT: normal Neck: no JVD, carotid bruits, or masses Cardiac: RRR; no murmurs, rubs, or gallops,no edema  Respiratory:  clear to auscultation bilaterally, normal work of breathing GI: soft, nontender, nondistended, + BS  No hepatomegaly  MS: no deformity Moving all extremities   Skin: warm and dry, no rash Neuro:  Strength and sensation are  intact Psych: euthymic mood, full affect   EKG:  EKG is not ordered today.   Lipid Panel    Component Value Date/Time   CHOL 196 02/28/2015 1202   TRIG 220.0* 02/28/2015 1202   HDL 52.90 02/28/2015 1202   CHOLHDL 4 02/28/2015 1202   VLDL 44.0* 02/28/2015 1202   LDLCALC * 06/05/2009 0547    132        Total Cholesterol/HDL:CHD Risk Coronary Heart Disease Risk Table                     Men   Women  1/2 Average Risk   3.4   3.3  Average Risk       5.0   4.4  2 X Average Risk   9.6   7.1  3 X Average Risk  23.4   11.0        Use the calculated Patient Ratio above and the CHD Risk Table to determine the patient's CHD Risk.        ATP III CLASSIFICATION (LDL):  <100     mg/dL   Optimal  100-129  mg/dL   Near or Above                    Optimal  130-159  mg/dL   Borderline  160-189  mg/dL   High  >190     mg/dL   Very High   LDLDIRECT 119.0 02/28/2015 1202      Wt Readings from Last 3 Encounters:  04/11/15 109.317 kg (241 lb)  03/13/15 109.317 kg (241  lb)  03/11/15 109.226 kg (240 lb 12.8 oz)      ASSESSMENT AND PLAN:  1  PAF  Discussed anticoagulation benefits and risks  WithPt understands risk of not being on  Will not want to start  Continue current meds    2.  HTN   Adequate control  3.  DM Watch carbs    4   HL    Start lipitor 10  F/U labs in 2 months        Disposition:   FU with  in  6 months  WIll be in touch with pt re lab results    Signed, Dorris Carnes, MD  04/11/2015 8:38 AM    [address], [place], [place]  [address] Phone: [phone]; Fax: [phone]

## 2015-04-11 NOTE — Patient Instructions (Signed)
Your physician has recommended you make the following change in your medication:  1.) START LIPITOR 10 MG [employer]  Your physician recommends that you return for lab work in: 2 MONTHS (LIPIDS, AST)  Your physician wants you to follow-up in: [place].  You will receive a reminder letter in the mail two months in advance. If you don't receive a letter, please call our office to schedule the follow-up appointment.

## 2015-04-13 ENCOUNTER — Other Ambulatory Visit: Payer: Self-pay | Admitting: Internal Medicine

## 2015-05-19 DIAGNOSIS — L237 Allergic contact dermatitis due to plants, except food: Secondary | ICD-10-CM | POA: Diagnosis not present

## 2015-05-22 ENCOUNTER — Emergency Department (HOSPITAL_COMMUNITY): Payer: Medicare Other

## 2015-05-22 ENCOUNTER — Encounter (HOSPITAL_COMMUNITY): Payer: Self-pay

## 2015-05-22 ENCOUNTER — Observation Stay (HOSPITAL_COMMUNITY)
Admission: EM | Admit: 2015-05-22 | Discharge: 2015-05-23 | Disposition: A | Discharge status: Home / Self Care | Payer: Medicare Other | Attending: Cardiology | Admitting: Cardiology

## 2015-05-22 DIAGNOSIS — Z85038 Personal history of other malignant neoplasm of large intestine: Secondary | ICD-10-CM | POA: Diagnosis present

## 2015-05-22 DIAGNOSIS — M549 Dorsalgia, unspecified: Secondary | ICD-10-CM

## 2015-05-22 DIAGNOSIS — Z86010 Personal history of colonic polyps: Secondary | ICD-10-CM | POA: Insufficient documentation

## 2015-05-22 DIAGNOSIS — K219 Gastro-esophageal reflux disease without esophagitis: Secondary | ICD-10-CM | POA: Insufficient documentation

## 2015-05-22 DIAGNOSIS — I10 Essential (primary) hypertension: Secondary | ICD-10-CM | POA: Diagnosis not present

## 2015-05-22 DIAGNOSIS — E1142 Type 2 diabetes mellitus with diabetic polyneuropathy: Secondary | ICD-10-CM

## 2015-05-22 DIAGNOSIS — I4891 Unspecified atrial fibrillation: Principal | ICD-10-CM | POA: Insufficient documentation

## 2015-05-22 DIAGNOSIS — K5900 Constipation, unspecified: Secondary | ICD-10-CM | POA: Diagnosis not present

## 2015-05-22 DIAGNOSIS — Z79899 Other long term (current) drug therapy: Secondary | ICD-10-CM | POA: Diagnosis not present

## 2015-05-22 DIAGNOSIS — E119 Type 2 diabetes mellitus without complications: Secondary | ICD-10-CM | POA: Insufficient documentation

## 2015-05-22 DIAGNOSIS — G971 Other reaction to spinal and lumbar puncture: Secondary | ICD-10-CM | POA: Diagnosis not present

## 2015-05-22 DIAGNOSIS — Z8711 Personal history of peptic ulcer disease: Secondary | ICD-10-CM | POA: Diagnosis not present

## 2015-05-22 DIAGNOSIS — G8929 Other chronic pain: Secondary | ICD-10-CM

## 2015-05-22 DIAGNOSIS — G4730 Sleep apnea, unspecified: Secondary | ICD-10-CM | POA: Insufficient documentation

## 2015-05-22 DIAGNOSIS — F419 Anxiety disorder, unspecified: Secondary | ICD-10-CM | POA: Diagnosis not present

## 2015-05-22 DIAGNOSIS — Z8619 Personal history of other infectious and parasitic diseases: Secondary | ICD-10-CM | POA: Diagnosis not present

## 2015-05-22 DIAGNOSIS — J45901 Unspecified asthma with (acute) exacerbation: Secondary | ICD-10-CM | POA: Insufficient documentation

## 2015-05-22 DIAGNOSIS — M109 Gout, unspecified: Secondary | ICD-10-CM | POA: Insufficient documentation

## 2015-05-22 DIAGNOSIS — K649 Unspecified hemorrhoids: Secondary | ICD-10-CM | POA: Insufficient documentation

## 2015-05-22 DIAGNOSIS — M1990 Unspecified osteoarthritis, unspecified site: Secondary | ICD-10-CM | POA: Insufficient documentation

## 2015-05-22 DIAGNOSIS — I480 Paroxysmal atrial fibrillation: Secondary | ICD-10-CM | POA: Diagnosis not present

## 2015-05-22 DIAGNOSIS — M797 Fibromyalgia: Secondary | ICD-10-CM | POA: Insufficient documentation

## 2015-05-22 DIAGNOSIS — Z87891 Personal history of nicotine dependence: Secondary | ICD-10-CM | POA: Diagnosis not present

## 2015-05-22 DIAGNOSIS — J45909 Unspecified asthma, uncomplicated: Secondary | ICD-10-CM | POA: Diagnosis present

## 2015-05-22 DIAGNOSIS — E785 Hyperlipidemia, unspecified: Secondary | ICD-10-CM | POA: Diagnosis not present

## 2015-05-22 DIAGNOSIS — R51 Headache: Secondary | ICD-10-CM | POA: Diagnosis not present

## 2015-05-22 DIAGNOSIS — R079 Chest pain, unspecified: Secondary | ICD-10-CM

## 2015-05-22 DIAGNOSIS — Z7982 Long term (current) use of aspirin: Secondary | ICD-10-CM | POA: Diagnosis not present

## 2015-05-22 DIAGNOSIS — K259 Gastric ulcer, unspecified as acute or chronic, without hemorrhage or perforation: Secondary | ICD-10-CM | POA: Insufficient documentation

## 2015-05-22 DIAGNOSIS — D72829 Elevated white blood cell count, unspecified: Secondary | ICD-10-CM

## 2015-05-22 DIAGNOSIS — Z86718 Personal history of other venous thrombosis and embolism: Secondary | ICD-10-CM | POA: Diagnosis not present

## 2015-05-22 DIAGNOSIS — E1169 Type 2 diabetes mellitus with other specified complication: Secondary | ICD-10-CM | POA: Diagnosis present

## 2015-05-22 LAB — CBC
HCT: 44.3 % (ref 36.0–46.0)
HEMATOCRIT: 49.4 % — AB (ref 36.0–46.0)
Hemoglobin: 16.4 g/dL — ABNORMAL HIGH (ref 12.0–15.0)
Hemoglobin: 14.6 g/dL (ref 12.0–15.0)
MCH: 28.4 pg (ref 26.0–34.0)
MCH: 28.6 pg (ref 26.0–34.0)
MCHC: 33.2 g/dL (ref 30.0–36.0)
MCHC: 33 g/dL (ref 30.0–36.0)
MCV: 85.6 fL (ref 78.0–100.0)
MCV: 86.9 fL (ref 78.0–100.0)
PLATELETS: 346 10*3/uL (ref 150–400)
Platelets: 264 10*3/uL (ref 150–400)
RBC: 5.77 MIL/uL — ABNORMAL HIGH (ref 3.87–5.11)
RBC: 5.1 MIL/uL (ref 3.87–5.11)
RDW: 14.4 % (ref 11.5–15.5)
RDW: 14.6 % (ref 11.5–15.5)
WBC: 17.6 10*3/uL — ABNORMAL HIGH (ref 4.0–10.5)
WBC: 10.6 10*3/uL — ABNORMAL HIGH (ref 4.0–10.5)

## 2015-05-22 LAB — BASIC METABOLIC PANEL
Anion gap: 13 (ref 5–15)
Anion gap: 9 (ref 5–15)
BUN: 21 mg/dL — AB (ref 6–20)
BUN: 23 mg/dL — AB (ref 6–20)
CHLORIDE: 106 mmol/L (ref 101–111)
CHLORIDE: 102 mmol/L (ref 101–111)
CO2: 21 mmol/L — AB (ref 22–32)
CO2: 27 mmol/L (ref 22–32)
CREATININE: 1.02 mg/dL — AB (ref 0.44–1.00)
CREATININE: 1.18 mg/dL — AB (ref 0.44–1.00)
Calcium: 9.2 mg/dL (ref 8.9–10.3)
Calcium: 8.6 mg/dL — ABNORMAL LOW (ref 8.9–10.3)
GFR calc Af Amer: >60 mL/min (ref >60)
GFR calc non Af Amer: 53 mL/min — ABNORMAL LOW (ref >60)
GFR calc non Af Amer: 45 mL/min — ABNORMAL LOW (ref >60)
GFR, EST AFRICAN AMERICAN: 52 mL/min — AB (ref >60)
GLUCOSE: 159 mg/dL — AB (ref 65–99)
Glucose, Bld: 199 mg/dL — ABNORMAL HIGH (ref 65–99)
POTASSIUM: 3.9 mmol/L (ref 3.5–5.1)
Potassium: 3.9 mmol/L (ref 3.5–5.1)
SODIUM: 140 mmol/L (ref 135–145)
Sodium: 138 mmol/L (ref 135–145)

## 2015-05-22 LAB — URINE MICROSCOPIC-ADD ON

## 2015-05-22 LAB — URINALYSIS, ROUTINE W REFLEX MICROSCOPIC
Bilirubin Urine: NEGATIVE
GLUCOSE, UA: 100 mg/dL — AB
Ketones, ur: NEGATIVE mg/dL
Nitrite: NEGATIVE
PH: 5.5 (ref 5.0–8.0)
PROTEIN: NEGATIVE mg/dL
SPECIFIC GRAVITY, URINE: 1.023 (ref 1.005–1.030)

## 2015-05-22 LAB — GLUCOSE, CAPILLARY
GLUCOSE-CAPILLARY: 136 mg/dL — AB (ref 65–99)
Glucose-Capillary: 174 mg/dL — ABNORMAL HIGH (ref 65–99)

## 2015-05-22 LAB — PROTIME-INR
INR: 1.06 (ref 0.00–1.49)
Prothrombin Time: 14 seconds (ref 11.6–15.2)

## 2015-05-22 LAB — I-STAT TROPONIN, ED: Troponin i, poc: 0.02 ng/mL (ref 0.00–0.08)

## 2015-05-22 LAB — T4, FREE: Free T4: 1.09 ng/dL (ref 0.61–1.12)

## 2015-05-22 MED ORDER — RIVAROXABAN 20 MG PO TABS: 20.0000 mg | ORAL_TABLET | Freq: Every day | ORAL | Status: DC

## 2015-05-22 MED ORDER — LOPERAMIDE HCL 2 MG PO CAPS
4.0000 mg | ORAL_CAPSULE | ORAL | Status: DC | PRN
Start: 2015-05-22 — End: 2015-05-23

## 2015-05-22 MED ORDER — INSULIN ASPART 100 UNIT/ML ~~LOC~~ SOLN
0.0000 [IU] | Freq: Three times a day (TID) | SUBCUTANEOUS | Status: DC
  Administered 2015-05-22 – 2015-05-23 (×2): 3 [IU] via SUBCUTANEOUS

## 2015-05-22 MED ORDER — LOSARTAN POTASSIUM 50 MG PO TABS
100.0000 mg | ORAL_TABLET | Freq: Every day | ORAL | Status: DC
  Administered 2015-05-22 – 2015-05-23 (×2): 100 mg via ORAL
  Filled 2015-05-22 (×2): qty 2

## 2015-05-22 MED ORDER — FUROSEMIDE 40 MG PO TABS
40.0000 mg | ORAL_TABLET | Freq: Every day | ORAL | Status: DC
  Filled 2015-05-22: qty 1

## 2015-05-22 MED ORDER — TRAMADOL HCL 50 MG PO TABS: 50.0000 mg | ORAL_TABLET | Freq: Three times a day (TID) | ORAL | Status: DC | PRN

## 2015-05-22 MED ORDER — ONDANSETRON HCL 4 MG/2ML IJ SOLN: 4.0000 mg | Freq: Four times a day (QID) | INTRAMUSCULAR | Status: DC | PRN

## 2015-05-22 MED ORDER — PROMETHAZINE HCL 25 MG PO TABS
25.0000 mg | ORAL_TABLET | Freq: Four times a day (QID) | ORAL | Status: DC | PRN
Start: 2015-05-22 — End: 2015-05-23

## 2015-05-22 MED ORDER — DILTIAZEM LOAD VIA INFUSION
20.0000 mg | Freq: Once | INTRAVENOUS | Status: AC
  Administered 2015-05-22: 20 mg via INTRAVENOUS
  Filled 2015-05-22: qty 20

## 2015-05-22 MED ORDER — PANTOPRAZOLE SODIUM 40 MG PO TBEC
40.0000 mg | DELAYED_RELEASE_TABLET | Freq: Every day | ORAL | Status: DC
  Administered 2015-05-22 – 2015-05-23 (×2): 40 mg via ORAL
  Filled 2015-05-22 (×2): qty 1

## 2015-05-22 MED ORDER — ATORVASTATIN CALCIUM 10 MG PO TABS: 10.0000 mg | ORAL_TABLET | Freq: Every day | ORAL | Status: DC

## 2015-05-22 MED ORDER — DILTIAZEM HCL 100 MG IV SOLR
5.0000 mg/h | INTRAVENOUS | Status: DC
  Administered 2015-05-22: 5 mg/h via INTRAVENOUS
  Filled 2015-05-22: qty 100

## 2015-05-22 MED ORDER — RIVAROXABAN 20 MG PO TABS
20.0000 mg | ORAL_TABLET | Freq: Every day | ORAL | Status: DC
  Administered 2015-05-22 – 2015-05-23 (×2): 20 mg via ORAL
  Filled 2015-05-22 (×2): qty 1

## 2015-05-22 MED ORDER — RIVAROXABAN 20 MG PO TABS
20.0000 mg | ORAL_TABLET | Freq: Every day | ORAL | Status: DC
  Filled 2015-05-22: qty 1

## 2015-05-22 MED ORDER — DEXTROSE 5 % IV SOLN
5.0000 mg/h | INTRAVENOUS | Status: DC
  Administered 2015-05-23: 5 mg/h via INTRAVENOUS
  Filled 2015-05-22: qty 100

## 2015-05-22 MED ORDER — ALBUTEROL SULFATE (2.5 MG/3ML) 0.083% IN NEBU: 3.0000 mL | INHALATION_SOLUTION | Freq: Four times a day (QID) | RESPIRATORY_TRACT | Status: DC | PRN

## 2015-05-22 MED ORDER — ALPRAZOLAM 0.5 MG PO TABS: 0.5000 mg | ORAL_TABLET | Freq: Three times a day (TID) | ORAL | Status: DC | PRN

## 2015-05-22 MED ORDER — DILTIAZEM HCL 60 MG PO TABS: 90.0000 mg | ORAL_TABLET | Freq: Four times a day (QID) | ORAL | Status: DC

## 2015-05-22 MED ORDER — SODIUM CHLORIDE 0.9 % IV BOLUS (SEPSIS)
1000.0000 mL | Freq: Once | INTRAVENOUS | Status: AC
  Administered 2015-05-22: 1000 mL via INTRAVENOUS

## 2015-05-22 MED ORDER — ACETAMINOPHEN 325 MG PO TABS: 650.0000 mg | ORAL_TABLET | ORAL | Status: DC | PRN

## 2015-05-22 NOTE — Plan of Care (Signed)
Problem: Education: Goal: Knowledge of [facility] Education information/materials will improve Outcome: Completed/Met Date Met:  05/22/15 Pt and family educated on unit and hospital policies. Pt's daughters work within health system and state familiarity with policies. Pt verbalized understanding

## 2015-05-22 NOTE — Progress Notes (Addendum)
At approximately 21:45, rounded on patient, checked telemetry and noted patient to be in normal sinus rhythm. Diltiazem infusing at 10 mg/hr. Reduced rate to 5 mg/hr and obtained vital signs with resting ECG.  Filed Vitals:   05/22/15 1646 05/22/15 2000 05/22/15 2019 05/22/15 2146  BP: 145/75 152/82  126/68  Pulse: 63   66  Temp: 98 F (36.7 C)  98.5 F (36.9 C)   TempSrc: Oral  Oral   Resp: 18   16  Height: 5\' 3"  (1.6 m)     Weight: 108.41 kg (239 lb)     SpO2: 99%  97% 99%    Resting ECG confirms normal sinus rhythm. Review of telemetry reveals conversion of rhythm at 20:57; strip posted to chart.  Patient reports no symptoms at this time.  Continuing to closely monitor.  Addendum for 05/23/15, 05:00:  Patient remains in normal sinus rhythm, refractory to continuous IV infusion with diltiazem, currently running at 5 mg/hr. Rate is holding in mid 60's with no ectopy noted overnight.  Continuing to monitor.

## 2015-05-22 NOTE — ED Provider Notes (Signed)
CSN: [id number]     Arrival date & time 05/22/15  [id number] History   First MD Initiated Contact with Patient 05/22/15 1009     Chief Complaint  Patient presents with  . Atrial Fibrillation     (Consider location/radiation/quality/duration/timing/severity/associated sxs/prior Treatment) HPI Comments: Patient is a 75 year old female with a history of paroxysmal atrial fibrillation currently on Cardizem and no anticoagulation at this time who presents with palpitations that started around 1 AM.. She states when symptoms started she took a Cardizem and a full dose aspirin without improvement in symptoms. She has had some mild chest tightness and tightness between her shoulder blades since the symptoms started. She states in the past when she's had this she is always converted spontaneously however she waited at home but this has not improved. She denies ever having cardioversion and states that she does not take a blood thinner because she is unable to function with it. She denies any recent illnesses and felt in her normal state of health prior to symptoms starting.  Patient is a 75 y.o. female presenting with atrial fibrillation. The history is provided by the patient.  Atrial Fibrillation This is a recurrent problem. The current episode started 6 to 12 hours ago. The problem occurs constantly. The problem has not changed since onset.Associated symptoms include chest pain, headaches and shortness of breath. Pertinent negatives include no abdominal pain. Nothing aggravates the symptoms. Nothing relieves the symptoms. The treatment provided no relief.    Past Medical History  Diagnosis Date  . Allergic rhinitis   . Asthma   . Gout     takes Colchicine prn  . Peptic ulcer disease   . History of PSVT (paroxysmal supraventricular tachycardia)   . HLD (hyperlipidemia)     pt doesn't take any medication for this  . DJD (degenerative joint disease)   . History of blood clots     right lung in early  90's  . Sleep apnea     doesn't use CPAP  . Shortness of breath     pt states that she can be sitting as well as exertion and get short of breath  . Bronchitis     in Jan 2013-uses Albuterol prn  . History of migraine headaches     last migraine 1yrs ago;Pt states she does get sinus headaches  . Osteoarthritis   . Chronic back pain     hx buldging disc  . Rash     on neck and started after she started taking her beta blocker 46yrs ago;medical MD has seen this  . Gastric ulcer   . Hemorrhoids   . Family hx of colon cancer   . Diabetes mellitus     glimepiride  . Yeast infection of the vagina     being treated for this now by dr.fontaine  . Bronchitis, acute 03-01-11    FINSHED Z-PAK-STILL ON PREDNISONE NOW  . Peripheral edema     takes Furosemide every other day  . Dizziness     occasionally  . Constipation     takes Colace prn  . Colon cancer ([place]) 2007    s/p surgery and chemo  . Urinary frequency   . Nocturia   . Anxiety     and panic attacks;pt states that she is claustrophobic  . Family history of anesthesia complication     daughter gets PONV  . Hypertension     takes Micardis and Diltiazem daily  . Atrial fibrillation ([place])  takes Coumadin daily-instructed to stop  . Spinal headache   . Numbness     left hand  . Fibromyalgia     but doesn't take any meds  . GERD (gastroesophageal reflux disease)     uses Tums prn  . History of colon polyps   . Urinary frequency   . Peripheral edema     takes Lasix daily  . History of shingles    Past Surgical History  Procedure Laterality Date  . Caesarean section  1966/69/72    x 3  . Heel spur surgery  1992    x 2  . Ankle surgery      tumor-benign removed left ankle  . Lumbar disc surgery  2008  . Pyelonidal cystectomy      at age 64  . Knee arthrosocpy      bil;couple of years before knee replacement  . Ganglion cyst excision  > 89yrs ago    removed from left pointer finger  . Cataract surgery  2010  .  Port a cath placed  2008  . Port a cath removed  2008  . Knee surgery      left TKA  . Anterior cervical decomp/discectomy fusion  01/12/2011    Procedure: ANTERIOR CERVICAL DECOMPRESSION/DISCECTOMY FUSION 2 LEVELS;  Surgeon: Cooper Render Pool;  Location: [place] [facility];  Service: Neurosurgery;  Laterality: Bilateral;  Cervical five-six, six-seven anterior cervical discectomy and fusion with allograft and plating  . Hysteroscopy w/d&c  02/19/2011    Procedure: DILATATION AND CURETTAGE /HYSTEROSCOPY;  Surgeon: Anastasio Auerbach, MD;  Location: [facility];  Service: Gynecology;  Laterality: N/A;  requests one hour  . Dilation and curettage of uterus  02-19-11    & POLYP REMOVAL  . Anterior cervical decomp/discectomy fusion  06/11/2011    Procedure: ANTERIOR CERVICAL DECOMPRESSION/DISCECTOMY FUSION 1 LEVEL/HARDWARE REMOVAL;  Surgeon: Charlie Pitter, MD;  Location: [place] [facility];  Service: Neurosurgery;  Laterality: N/A;  Cervical four-five anterior cervical decompression fusion with allograft and plating; Removal of Cervical five to seven plate  . Joint replacement Left     knee  . Colectomy  2007    colon cancer  . Cardiac catheterization  90's and 2005  . Tubal ligation  1972  . Colonoscopy    . Bilateral cataracts removed    . Shoulder arthroscopy with subacromial decompression and open rotator c Right 03/16/2013    Procedure: RIGHT SHOULDER ARTHROSCOPY WITH SUBACROMIAL DECOMPRESSION AND OPEN ROTATOR CUFF REPAIR, OPEN DISTAL CLAVICLE  RESECTION POSSIBLE BICEP TENODESIS ;  Surgeon: Augustin Schooling, MD;  Location: [facility];  Service: Orthopedics;  Laterality: Right;  . Carpal tunnel release Left 11/2013  . Trigger finger release Left     thumb, tendonitis, tumor removed   Family History  Problem Relation Age of Onset  . Coronary artery disease Other   . Heart attack Mother   . Stroke Mother   . Hypertension Mother   . Heart disease Father   . Hypertension Sister   . Kidney cancer Sister     left  .  Bladder Cancer Brother   . Liver cancer Brother     spread to colon  . Anesthesia problems Neg Hx   . Hypotension Neg Hx   . Malignant hyperthermia Neg Hx   . Pseudochol deficiency Neg Hx    Social History  Substance Use Topics  . Smoking status: Former Smoker -- 1.50 packs/day for 30 years    Types: Cigarettes  Quit date: 02/23/1991  . Smokeless tobacco: Never Used     Comment: quit smoking 78yrs ago  . Alcohol Use: No   OB History    Gravida Para Term Preterm AB TAB SAB Ectopic Multiple Living   3 3        3      Review of Systems  Respiratory: Positive for shortness of breath.   Cardiovascular: Positive for chest pain.  Gastrointestinal: Negative for abdominal pain.  Neurological: Positive for headaches.  All other systems reviewed and are negative.     Allergies  Benazepril; Adhesive; Morphine; Nifedipine; and Piroxicam  Home Medications   Prior to Admission medications   Medication Sig Start Date End Date Taking? Authorizing Provider  ALPRAZolam Duanne Moron) 0.5 MG tablet Take 0.5 mg by mouth 3 (three) times daily as needed for anxiety or sleep.   Yes Historical Provider, MD  aspirin 325 MG tablet Take 650 mg by mouth once.   Yes Historical Provider, MD  diltiazem (CARDIZEM CD) 240 MG 24 hr capsule TAKE ONE CAPSULE BY MOUTH EVERY DAY 04/14/15  Yes Fay Records, MD  glimepiride (AMARYL) 4 MG tablet TAKE 1 TABLET (4 MG TOTAL) BY MOUTH DAILY WITH BREAKFAST. 11/22/14  Yes Hoyt Koch, MD  loperamide (IMODIUM) 2 MG capsule Take 4 mg by mouth as needed for diarrhea or loose stools.   Yes Historical Provider, MD  losartan (COZAAR) 100 MG tablet Take 1 tablet (100 mg total) by mouth daily. 02/28/15  Yes Hoyt Koch, MD  promethazine (PHENERGAN) 25 MG tablet Take 25 mg by mouth every 6 (six) hours as needed for nausea or vomiting.   Yes Historical Provider, MD  traMADol (ULTRAM) 50 MG tablet Take 50 mg by mouth 3 (three) times daily as needed for moderate pain.    Yes Historical Provider, MD  albuterol (PROVENTIL HFA;VENTOLIN HFA) 108 (90 BASE) MCG/ACT inhaler Inhale 2 puffs into the lungs every 6 (six) hours as needed for wheezing or shortness of breath.    Historical Provider, MD  atorvastatin (LIPITOR) 10 MG tablet Take 1 tablet (10 mg total) by mouth daily. 04/11/15   Fay Records, MD  furosemide (LASIX) 40 MG tablet Take 40 mg by mouth daily.     Historical Provider, MD  glucose blood (ONE TOUCH ULTRA TEST) test strip 1 each by Other route 2 (two) times daily as needed for other. for testing 02/28/15   Hoyt Koch, MD  pantoprazole (PROTONIX) 40 MG tablet Take 1 tablet (40 mg total) by mouth daily. 09/30/14   Amy S Esterwood, PA-C   BP 109/70 mmHg  Pulse 53  Temp(Src) 97.9 F (36.6 C) (Oral)  Resp 17  Ht 5\' 3"  (1.6 m)  Wt 238 lb (107.956 kg)  BMI 42.17 kg/m2  SpO2 95%  LMP 12/30/2000 Physical Exam  Constitutional: She is oriented to person, place, and time. She appears well-developed and well-nourished. No distress.  HENT:  Head: Normocephalic and atraumatic.  Mouth/Throat: Oropharynx is clear and moist.  Eyes: Conjunctivae and EOM are normal. Pupils are equal, round, and reactive to light.  Neck: Normal range of motion. Neck supple.  Cardiovascular: Intact distal pulses.  An irregularly irregular rhythm present. Tachycardia present.   No murmur heard. Pulmonary/Chest: Effort normal and breath sounds normal. No respiratory distress. She has no wheezes. She has no rales.  Abdominal: Soft. She exhibits no distension. There is no tenderness. There is no rebound and no guarding.  Musculoskeletal: Normal range of motion. She  exhibits no edema or tenderness.  Neurological: She is alert and oriented to person, place, and time.  Skin: Skin is warm and dry. No rash noted. No erythema.  Psychiatric: She has a normal mood and affect. Her behavior is normal.  Nursing note and vitals reviewed.   ED Course  Procedures (including critical care  time) Labs Review Labs Reviewed  BASIC METABOLIC PANEL - Abnormal; Notable for the following:    CO2 21 (*)    Glucose, Bld 159 (*)    BUN 21 (*)    Creatinine, Ser 1.02 (*)    GFR calc non Af Amer 53 (*)    All other components within normal limits  CBC - Abnormal; Notable for the following:    WBC 17.6 (*)    RBC 5.77 (*)    Hemoglobin 16.4 (*)    HCT 49.4 (*)    All other components within normal limits  PROTIME-INR  Randolm Idol, ED    Imaging Review Dg Chest Port 1 View  05/22/2015  CLINICAL DATA:  75 year old female with chest pain. Initial encounter. EXAM: PORTABLE CHEST 1 VIEW COMPARISON:  01/24/2015 and earlier. FINDINGS: Portable AP upright view at 1039 hours. Normal lung volumes. Normal cardiac size and mediastinal contours. Visualized tracheal air column is within normal limits. Allowing for portable technique, the lungs are clear. No pneumothorax. Calcified aortic atherosclerosis. IMPRESSION: No acute cardiopulmonary abnormality. Electronically Signed   By: Genevie Ann M.D.   On: 05/22/2015 10:49   I have personally reviewed and evaluated these images and lab results as part of my medical decision-making.   EKG Interpretation   Date/Time:  Thursday May 22 2015 09:59:20 EDT Ventricular Rate:  161 PR Interval:    QRS Duration: 74 QT Interval:  282 QTC Calculation: 461 R Axis:   -36 Text Interpretation:  Atrial fibrillation with rapid ventricular response  Left axis deviation Nonspecific ST abnormality No significant change since  last tracing Confirmed by Maryan Rued  MD, Loree Fee ([id number]) on 05/22/2015  10:09:07 AM      MDM   Final diagnoses:  Atrial fibrillation with RVR [facility])    Patient is a 75 year old lady with a history of paroxysmal atrial fibrillation who is currently taking diltiazem daily who went into A. fib around 1 AM this morning. Symptoms have continued which prompted her to come to the emergency room. She is having some tightness between her  shoulder plates in her chest with some mild shortness of breath. She states these are the symptoms she gets every time she goes into atrial fibrillation. The last time she was in A. fib was in January which spontaneously converted forgetting medication. She has never received cardioversion. She currently takes no blood thinners because she states she cannot function on them and does not take aspirin daily because it bothers her stomach.  She is awake alert but currently tachycardic between 1:30 to 160 with an irregular rhythm. EKG is consistent with atrial fibrillation with RVR. We will initially try a Cardizem bolus and drip. We'll discuss with cardiology if patient does not convert. Patient's labs show normal troponin, BMP with a mildly elevated blood sugar and a CBC with a leukocytosis of 17,000 which is most likely reactive in nature as patient has no complaints of infectious symptoms. Chest x-ray within normal limits.  CRITICAL CARE Performed by: Blanchie Dessert Total critical care time: 30 minutes Critical care time was exclusive of separately billable procedures and treating other patients. Critical care was necessary to treat or  prevent imminent or life-threatening deterioration. Critical care was time spent personally by me on the following activities: development of treatment plan with patient and/or surrogate as well as nursing, discussions with consultants, evaluation of patient's response to treatment, examination of patient, obtaining history from patient or surrogate, ordering and performing treatments and interventions, ordering and review of laboratory studies, ordering and review of radiographic studies, pulse oximetry and re-evaluation of patient's condition.     Blanchie Dessert, MD 05/22/15 1200

## 2015-05-22 NOTE — H&P (Signed)
Patient ID: [name] MRN: [id number], DOB/AGE: [birth year]   Admit date: 05/22/2015   Primary Physician: Hoyt Koch, MD Primary Cardiologist: Dr. Harrington Challenger  Pt. Profile:  [name] is a 75 y.o. female with a history of h/o PAF, HTN, chronic back pain, colon cancer (2007 this was a right-sided lesion T2 N1) and DM who presented to [facility] ED for evaluation of palpitations.   HPI: As above. The patient seen in ED  for atrial fibrillation w/ RVR on 03/08/15. Per notes, she converted spontaneously in the ED.Since then seen in clinic x 2. Last seen by Dr. Harrington Challenger 04/11/15. She was in sinus rhythm both time. Despite a CHA2DS2 VASc score of at least 4 for HTN, DM, Age 65-74 and female sex, she is not on anticoagulation. Chart review shows that she was on Coumadin in the past. The patient reports that she self discontinued it due to "intolerance". She reports that she "could not function with it". She is on Cardizem for rate control. She has refused anticoagulation during both outpatient visit. She states that she cannot take aspirin because it causes abdominal pain.  Patient states that she continues to have frequent episodes of "chest fluttering". Occurs a few times in a week and resolved by itself within 2-3 minutes by itself. Last episode started around 1 AM 05/22/15. Due to continued palpitations she took her Cardizem and aspirin 325 mg without improvement. She endores having pain between shoulder blade since onset of symptoms. Some intermittent chest tightness and shortness of breath as well. Patient states that she has a chronic back pain and unable to lay flat. Denies lower extremity edema, syncope, orthopnea, PND or blood in her stool or urine. States that as she frequently has loose stool with A. Fib.  In ED patient found to have a A. fib with RVR at a rate of 161 bpm and started on IV Cardizem--> due to hypotension 83/56, Cardizem is discontinue. Blood pressure improved. Currently at  rate of 90s-100s. Serum creatinine of 1.02, point-of-care troponin negative. WBC 17.6 chest x-ray without acute cardiopulmonary disease.  Last echocardiogram 08/2010 shows left ventricular function of 55-60%, mild aortic regurgitation  Problem List  Past Medical History  Diagnosis Date  . Allergic rhinitis   . Asthma   . Gout     takes Colchicine prn  . Peptic ulcer disease   . History of PSVT (paroxysmal supraventricular tachycardia)   . HLD (hyperlipidemia)     pt doesn't take any medication for this  . DJD (degenerative joint disease)   . History of blood clots     right lung in early 90's  . Sleep apnea     doesn't use CPAP  . Shortness of breath     pt states that she can be sitting as well as exertion and get short of breath  . Bronchitis     in Jan 2013-uses Albuterol prn  . History of migraine headaches     last migraine 92yrs ago;Pt states she does get sinus headaches  . Osteoarthritis   . Chronic back pain     hx buldging disc  . Rash     on neck and started after she started taking her beta blocker 58yrs ago;medical MD has seen this  . Gastric ulcer   . Hemorrhoids   . Family hx of colon cancer   . Diabetes mellitus     glimepiride  . Yeast infection of the vagina  being treated for this now by dr.fontaine  . Bronchitis, acute 03-01-11    FINSHED Z-PAK-STILL ON PREDNISONE NOW  . Peripheral edema     takes Furosemide every other day  . Dizziness     occasionally  . Constipation     takes Colace prn  . Colon cancer ([place]) 2007    s/p surgery and chemo  . Urinary frequency   . Nocturia   . Anxiety     and panic attacks;pt states that she is claustrophobic  . Family history of anesthesia complication     daughter gets PONV  . Hypertension     takes Micardis and Diltiazem daily  . Atrial fibrillation ([place])     takes Coumadin daily-instructed to stop  . Spinal headache   . Numbness     left hand  . Fibromyalgia     but doesn't take any meds  . GERD  (gastroesophageal reflux disease)     uses Tums prn  . History of colon polyps   . Urinary frequency   . Peripheral edema     takes Lasix daily  . History of shingles     Past Surgical History  Procedure Laterality Date  . Caesarean section  1966/69/72    x 3  . Heel spur surgery  1992    x 2  . Ankle surgery      tumor-benign removed left ankle  . Lumbar disc surgery  2008  . Pyelonidal cystectomy      at age 32  . Knee arthrosocpy      bil;couple of years before knee replacement  . Ganglion cyst excision  > 55yrs ago    removed from left pointer finger  . Cataract surgery  2010  . Port a cath placed  2008  . Port a cath removed  2008  . Knee surgery      left TKA  . Anterior cervical decomp/discectomy fusion  01/12/2011    Procedure: ANTERIOR CERVICAL DECOMPRESSION/DISCECTOMY FUSION 2 LEVELS;  Surgeon: Cooper Render Pool;  Location: [facility];  Service: Neurosurgery;  Laterality: Bilateral;  Cervical five-six, six-seven anterior cervical discectomy and fusion with allograft and plating  . Hysteroscopy w/d&c  02/19/2011    Procedure: DILATATION AND CURETTAGE /HYSTEROSCOPY;  Surgeon: Anastasio Auerbach, MD;  Location: [facility];  Service: Gynecology;  Laterality: N/A;  requests one hour  . Dilation and curettage of uterus  02-19-11    & POLYP REMOVAL  . Anterior cervical decomp/discectomy fusion  06/11/2011    Procedure: ANTERIOR CERVICAL DECOMPRESSION/DISCECTOMY FUSION 1 LEVEL/HARDWARE REMOVAL;  Surgeon: Charlie Pitter, MD;  Location: [facility];  Service: Neurosurgery;  Laterality: N/A;  Cervical four-five anterior cervical decompression fusion with allograft and plating; Removal of Cervical five to seven plate  . Joint replacement Left     knee  . Colectomy  2007    colon cancer  . Cardiac catheterization  90's and 2005  . Tubal ligation  1972  . Colonoscopy    . Bilateral cataracts removed    . Shoulder arthroscopy with subacromial decompression and open rotator c Right  03/16/2013    Procedure: RIGHT SHOULDER ARTHROSCOPY WITH SUBACROMIAL DECOMPRESSION AND OPEN ROTATOR CUFF REPAIR, OPEN DISTAL CLAVICLE  RESECTION POSSIBLE BICEP TENODESIS ;  Surgeon: Augustin Schooling, MD;  Location: [facility];  Service: Orthopedics;  Laterality: Right;  . Carpal tunnel release Left 11/2013  . Trigger finger release Left     thumb, tendonitis, tumor removed  Allergies  Allergies  Allergen Reactions  . Benazepril Other (See Comments)    coughing  . Adhesive [Tape] Other (See Comments)    redness  . Morphine Other (See Comments)       Makes you feel bad all over  . Nifedipine Hives and Itching  . Piroxicam Hives and Itching     Home Medications  Prior to Admission medications   Medication Sig Start Date End Date Taking? Authorizing Provider  ALPRAZolam Duanne Moron) 0.5 MG tablet Take 0.5 mg by mouth 3 (three) times daily as needed for anxiety or sleep.   Yes Historical Provider, MD  aspirin 325 MG tablet Take 650 mg by mouth once.   Yes Historical Provider, MD  diltiazem (CARDIZEM CD) 240 MG 24 hr capsule TAKE ONE CAPSULE BY MOUTH EVERY DAY 04/14/15  Yes Fay Records, MD  glimepiride (AMARYL) 4 MG tablet TAKE 1 TABLET (4 MG TOTAL) BY MOUTH DAILY WITH BREAKFAST. 11/22/14  Yes Hoyt Koch, MD  loperamide (IMODIUM) 2 MG capsule Take 4 mg by mouth as needed for diarrhea or loose stools.   Yes Historical Provider, MD  losartan (COZAAR) 100 MG tablet Take 1 tablet (100 mg total) by mouth daily. 02/28/15  Yes Hoyt Koch, MD  promethazine (PHENERGAN) 25 MG tablet Take 25 mg by mouth every 6 (six) hours as needed for nausea or vomiting.   Yes Historical Provider, MD  traMADol (ULTRAM) 50 MG tablet Take 50 mg by mouth 3 (three) times daily as needed for moderate pain.   Yes Historical Provider, MD  albuterol (PROVENTIL HFA;VENTOLIN HFA) 108 (90 BASE) MCG/ACT inhaler Inhale 2 puffs into the lungs every 6 (six) hours as needed for wheezing or shortness of breath.     Historical Provider, MD  atorvastatin (LIPITOR) 10 MG tablet Take 1 tablet (10 mg total) by mouth daily. Patient not taking: Reported on 05/22/2015 04/11/15   Fay Records, MD  furosemide (LASIX) 40 MG tablet Take 40 mg by mouth daily.     Historical Provider, MD  glucose blood (ONE TOUCH ULTRA TEST) test strip 1 each by Other route 2 (two) times daily as needed for other. for testing 02/28/15   Hoyt Koch, MD  pantoprazole (PROTONIX) 40 MG tablet Take 1 tablet (40 mg total) by mouth daily. Patient not taking: Reported on 05/22/2015 09/30/14   Amy Genia Harold, PA-C    Family History  Family History  Problem Relation Age of Onset  . Coronary artery disease Other   . Heart attack Mother   . Stroke Mother   . Hypertension Mother   . Heart disease Father   . Hypertension Sister   . Kidney cancer Sister     left  . Bladder Cancer Brother   . Liver cancer Brother     spread to colon  . Anesthesia problems Neg Hx   . Hypotension Neg Hx   . Malignant hyperthermia Neg Hx   . Pseudochol deficiency Neg Hx    Family Status  Relation Status Death Age  . Mother Deceased 50  . Father Deceased     pacemaker  . Sister Alive   . Brother Alive   . Brother Deceased   . Sister Alive   . Sister Alive   . Sister Alive   . Sister Deceased age 89    strep  . Brother Deceased     in infancy  . Brother Deceased     in infancy  . Maternal Grandmother  Deceased   . Maternal Grandfather Deceased   . Paternal Grandmother Deceased   . Paternal Grandfather Deceased       Social History  Social History   Social History  . Marital Status: Married    Spouse Name: N/A  . Number of Children: 3  . Years of Education: N/A   Occupational History  . retired    Social History Main Topics  . Smoking status: Former Smoker -- 1.50 packs/day for 30 years    Types: Cigarettes    Quit date: 02/23/1991  . Smokeless tobacco: Never Used     Comment: quit smoking 60yrs ago  . Alcohol Use: No  .  Drug Use: No  . Sexual Activity:    Partners: Male    Birth Control/ Protection: Post-menopausal   Other Topics Concern  . Not on file   Social History Narrative   Regular exercise- no.     All other systems reviewed and are otherwise negative except as noted above.  Physical Exam  Blood pressure 116/82, pulse 80, temperature 97.9 F (36.6 C), temperature source Oral, resp. rate 16, height 5\' 3"  (1.6 m), weight 238 lb (107.956 kg), last menstrual period 12/30/2000, SpO2 99 %.  General: Pleasant, NAD Psych: Normal affect. Neuro: Alert and oriented X 3. Moves all extremities spontaneously. HEENT: Normal  Neck: Supple without bruits or JVD. Lungs:  Resp regular and unlabored, CTA. Heart: Ir Ir no s3, s4, or murmurs. Abdomen: Soft, non-tender, non-distended, BS + x 4.  Extremities: No clubbing, cyanosis or edema. DP/PT/Radials 2+ and equal bilaterally.  Labs  No results for input(s): CKTOTAL, CKMB, TROPONINI in the last 72 hours. Lab Results  Component Value Date   WBC 17.6* 05/22/2015   HGB 16.4* 05/22/2015   HCT 49.4* 05/22/2015   MCV 85.6 05/22/2015   PLT 346 05/22/2015    Recent Labs Lab 05/22/15 1004  NA 140  K 3.9  CL 106  CO2 21*  BUN 21*  CREATININE 1.02*  CALCIUM 9.2  GLUCOSE 159*   Lab Results  Component Value Date   CHOL 196 02/28/2015   HDL 52.90 02/28/2015   LDLCALC * 06/05/2009    132        Total Cholesterol/HDL:CHD Risk Coronary Heart Disease Risk Table                     Men   Women  1/2 Average Risk   3.4   3.3  Average Risk       5.0   4.4  2 X Average Risk   9.6   7.1  3 X Average Risk  23.4   11.0        Use the calculated Patient Ratio above and the CHD Risk Table to determine the patient's CHD Risk.        ATP III CLASSIFICATION (LDL):  <100     mg/dL   Optimal  100-129  mg/dL   Near or Above                    Optimal  130-159  mg/dL   Borderline  160-189  mg/dL   High  >190     mg/dL   Very High   TRIG 220.0* 02/28/2015     Radiology/Studies  Dg Chest Port 1 View  05/22/2015  CLINICAL DATA:  75 year old female with chest pain. Initial encounter. EXAM: PORTABLE CHEST 1 VIEW COMPARISON:  01/24/2015 and earlier. FINDINGS: Portable AP upright  view at 1039 hours. Normal lung volumes. Normal cardiac size and mediastinal contours. Visualized tracheal air column is within normal limits. Allowing for portable technique, the lungs are clear. No pneumothorax. Calcified aortic atherosclerosis. IMPRESSION: No acute cardiopulmonary abnormality. Electronically Signed   By: Genevie Ann M.D.   On: 05/22/2015 10:49   Echo 06/2013 Indications:  Dyspnea 786.09.  -------------------------------------------------------------------- History: PMH: Acquired from the patient and from the patient's chart. PMH: Chest pain. Obstructive sleep apnea. Degenerative Disc Disease. Asthma. Risk factors: Former tobacco use. Hypertension. Diabetes mellitus. Morbidly obese.  -------------------------------------------------------------------- Study Conclusions  - Left ventricle: The cavity size was normal. Wall thickness was increased in a pattern of mild LVH. Systolic function was normal. The estimated ejection fraction was in the range of 55% to 60%. There was an increased relative contribution of atrial contraction to ventricular filling. - Aortic valve: Mild regurgitation.   ECG  Afib at rate of 161bpm  ASSESSMENT AND PLAN  1. Afib with RVR - This is her second episode of A. fib leading to ER presentation this year. Last episode was in January 2017 and converted to sinus rhythm by herself in ED. However, she continued to have short episodes of "chest fluttering" lasting 2-3 minutes few times in a week. This is her worse episode. She was previously on Coumadin however discontinued by herself. States "I cannot function ".  CHA2DS2 VASc score of at least 4 for HTN, DM, Age 62-74 and female sex.  -  IV Cardizem discontinued --> due  to hypotension 83/56.  Blood pressure improved. Currently at rate of 90s-100s with intermittent pause.  - Last echocardiogram 08/2010 shows left ventricular function of 55-60%, mild aortic regurgitation - Symptoms of onset 1 AM however unable to do cardioversion due to no anticoagulation. Seems recurrent A. Fib and not on any anticoagulation.  - Will admit and get echocardiogram. Rate vs rhythm control. Will start on Cardizem 90mg  q 6 hours. Risk of stroke would be higher if started on antiarrythmic without anticoagulation when ever she converts. Address importance of anticoagulation however she denied again. She understands risk of stroke.  2. HTN - Relatively stable  3. Leucocytosis - No fever. Complains of sinus congestions. Chest x-ray clear. We will continue to monitor. Get UA.  4. DM - Place on SSI  5. HL - 02/28/2015: Cholesterol 196; HDL 52.90; Triglycerides 220.0*; VLDL 44.0*  - Continue statin    Signed, Bhagat,Bhavinkumar, PA-C 05/22/2015, 2:02 PM Pager [phone]  Personally seen and examined. Agree with above.  75 year old with PAF, DM, morbid obesity, leukocytosis. -diltiazem PO 90 Q6 (increased dose) -Xarelto 20mg  QD (encouraged use, discussed risks and benefits. Worried about cost. Case Mgt.) -U/A -Last episode lasted 4 hours. Hopeful auto conversion. -May need to consider anti-arrhythmic in future.  Heart: Irreg irreg, no M/r/g Lungs:CTAB Trace LE edema Obese, alert. Anxious  Candee Furbish, MD

## 2015-05-22 NOTE — ED Notes (Signed)
Cards at bedside

## 2015-05-22 NOTE — ED Notes (Signed)
Patient here with rapid atrial fib since 0100, reports palpitations and CP with same with radiation to back, hx of same

## 2015-05-22 NOTE — Progress Notes (Signed)
Pt arrived to floor with ED RN. RN stated that dilt drip was stopped earlier in the day d/t drop in BP however pt's HR sustaining 120s-130s so she restarted the drip.  Sign and Held orders released and PO dilt orders written. Pt's HR currently sustaining in 130s-140s while lying in bed. BP [id number] systolic. Almyra Deforest, [place] with cards paged and made aware. Order received to continue IV cardizem as long as BP tolerates and hold on giving PO. Will continue to monitor pt closely.

## 2015-05-23 ENCOUNTER — Observation Stay (HOSPITAL_BASED_OUTPATIENT_CLINIC_OR_DEPARTMENT_OTHER): Payer: Medicare Other

## 2015-05-23 DIAGNOSIS — D72829 Elevated white blood cell count, unspecified: Secondary | ICD-10-CM

## 2015-05-23 DIAGNOSIS — I4891 Unspecified atrial fibrillation: Secondary | ICD-10-CM | POA: Diagnosis not present

## 2015-05-23 DIAGNOSIS — G8929 Other chronic pain: Secondary | ICD-10-CM

## 2015-05-23 DIAGNOSIS — M549 Dorsalgia, unspecified: Secondary | ICD-10-CM

## 2015-05-23 LAB — HEMOGLOBIN A1C
Hgb A1c MFr Bld: 7.8 % — ABNORMAL HIGH (ref 4.8–5.6)
Mean Plasma Glucose: 177 mg/dL

## 2015-05-23 LAB — ECHOCARDIOGRAM COMPLETE
HEIGHTINCHES: 63 in
WEIGHTICAEL: 3790.4 [oz_av]

## 2015-05-23 LAB — GLUCOSE, CAPILLARY
GLUCOSE-CAPILLARY: 110 mg/dL — AB (ref 65–99)
Glucose-Capillary: 167 mg/dL — ABNORMAL HIGH (ref 65–99)

## 2015-05-23 LAB — CEA: CEA: 2.6 ng/mL (ref 0.0–4.7)

## 2015-05-23 LAB — TSH: TSH: 1.339 u[IU]/mL (ref 0.350–4.500)

## 2015-05-23 MED ORDER — DILTIAZEM HCL ER COATED BEADS 180 MG PO CP24
360.0000 mg | ORAL_CAPSULE | Freq: Every day | ORAL | Status: DC
  Administered 2015-05-23: 360 mg via ORAL
  Filled 2015-05-23: qty 2

## 2015-05-23 MED ORDER — DEXTROSE 5 % IV SOLN
INTRAVENOUS | Status: AC
  Filled 2015-05-23: qty 100

## 2015-05-23 MED ORDER — DILTIAZEM HCL ER COATED BEADS 360 MG PO CP24: 360.0000 mg | ORAL_CAPSULE | Freq: Every day | ORAL | Status: DC

## 2015-05-23 MED ORDER — DILTIAZEM HCL ER COATED BEADS 180 MG PO CP24: 360.0000 mg | ORAL_CAPSULE | Freq: Every day | ORAL | Status: DC

## 2015-05-23 MED ORDER — FLECAINIDE ACETATE 50 MG PO TABS
50.0000 mg | ORAL_TABLET | Freq: Two times a day (BID) | ORAL | Status: DC
Start: 2015-05-23 — End: 2015-05-23
  Administered 2015-05-23: 50 mg via ORAL
  Filled 2015-05-23: qty 1

## 2015-05-23 MED ORDER — DEXTROSE 5 % IV SOLN: 5.0000 mg/h | INTRAVENOUS | Status: DC

## 2015-05-23 MED ORDER — FLECAINIDE ACETATE 50 MG PO TABS: 50.0000 mg | ORAL_TABLET | Freq: Two times a day (BID) | ORAL | Status: DC

## 2015-05-23 MED ORDER — RIVAROXABAN 20 MG PO TABS: 20.0000 mg | ORAL_TABLET | Freq: Every day | ORAL | Status: DC

## 2015-05-23 NOTE — Care Management Obs Status (Signed)
Exline NOTIFICATION   Patient Details  Name: RAICHEL COOLER MRN: [id number] Date of Birth: 1940-08-27   Medicare Observation Status Notification Given:  Yes (afib)    Bethena Roys, RN 05/23/2015, 10:53 AM

## 2015-05-23 NOTE — Discharge Instructions (Addendum)
Information on my medicine - XARELTO (Rivaroxaban)  This medication education was reviewed with me or my healthcare representative as part of my discharge preparation.  The pharmacist that spoke with me during my hospital stay was:  Pat Patrick, [facility]  Why was Xarelto prescribed for you? Xarelto was prescribed for you to reduce the risk of a blood clot forming that can cause a stroke if you have a medical condition called atrial fibrillation (a type of irregular heartbeat).  What do you need to know about xarelto ? Take your Xarelto ONCE DAILY at the same time every day with your evening meal. If you have difficulty swallowing the tablet whole, you may crush it and mix in applesauce just prior to taking your dose.  Take Xarelto exactly as prescribed by your doctor and DO NOT stop taking Xarelto without talking to the doctor who prescribed the medication.  Stopping without other stroke prevention medication to take the place of Xarelto may increase your risk of developing a clot that causes a stroke.  Refill your prescription before you run out.  After discharge, you should have regular check-up appointments with your healthcare provider that is prescribing your Xarelto.  In the future your dose may need to be changed if your kidney function or weight changes by a significant amount.  What do you do if you miss a dose? If you are taking Xarelto ONCE DAILY and you miss a dose, take it as soon as you remember on the same day then continue your regularly scheduled once daily regimen the next day. Do not take two doses of Xarelto at the same time or on the same day.   Important Safety Information A possible side effect of Xarelto is bleeding. You should call your healthcare provider right away if you experience any of the following: ? Bleeding from an injury or your nose that does not stop. ? Unusual colored urine (red or dark brown) or unusual colored stools (red or  black). ? Unusual bruising for unknown reasons. ? A serious fall or if you hit your head (even if there is no bleeding).  Some medicines may interact with Xarelto and might increase your risk of bleeding while on Xarelto. To help avoid this, consult your healthcare provider or pharmacist prior to using any new prescription or non-prescription medications, including herbals, vitamins, non-steroidal anti-inflammatory drugs (NSAIDs) and supplements.   Flecainide tablets What is this medicine? FLECAINIDE (FLEK a nide) is an antiarrhythmic drug. This medicine is used to prevent irregular heart rhythm. It can also slow down fast heartbeats called tachycardia. This medicine may be used for other purposes; ask your health care provider or pharmacist if you have questions. What should I tell my health care provider before I take this medicine? They need to know if you have any of these conditions: -abnormal levels of potassium in the blood -heart disease including heart rhythm and heart rate problems -kidney or liver disease -recent heart attack -an unusual or allergic reaction to flecainide, local anesthetics, other medicines, foods, dyes, or preservatives -pregnant or trying to get pregnant -breast-feeding How should I use this medicine? Take this medicine by mouth with a glass of water. Follow the directions on the prescription label. You can take this medicine with or without food. Take your doses at regular intervals. Do not take your medicine more often than directed. Do not stop taking this medicine suddenly. This may cause serious, heart-related side effects. If your doctor wants you to stop the  medicine, the dose may be slowly lowered over time to avoid any side effects. Talk to your pediatrician regarding the use of this medicine in children. While this drug may be prescribed for children as young as 1 year of age for selected conditions, precautions do apply. Overdosage: If you think you  have taken too much of this medicine contact a poison control center or emergency room at once. NOTE: This medicine is only for you. Do not share this medicine with others. What if I miss a dose? If you miss a dose, take it as soon as you can. If it is almost time for your next dose, take only that dose. Do not take double or extra doses. What may interact with this medicine? Do not take this medicine with any of the following medications: -amoxapine -arsenic trioxide -certain antibiotics like clarithromycin, erythromycin, gatifloxacin, gemifloxacin, levofloxacin, moxifloxacin, sparfloxacin, or troleandomycin -certain antidepressants called tricyclic antidepressants like amitriptyline, imipramine, or nortriptyline -certain medicines to control heart rhythm like disopyramide, dofetilide, encainide, moricizine, procainamide, propafenone, and quinidine -cisapride -cyclobenzaprine -delavirdine -droperidol -haloperidol -hawthorn -imatinib -levomethadyl -maprotiline -medicines for malaria like chloroquine and halofantrine -pentamidine -phenothiazines like chlorpromazine, mesoridazine, prochlorperazine, thioridazine -pimozide -quinine -ranolazine -ritonavir -sertindole -ziprasidone This medicine may also interact with the following medications: -cimetidine -medicines for angina or high blood pressure -medicines to control heart rhythm like amiodarone and digoxin This list may not describe all possible interactions. Give your health care provider a list of all the medicines, herbs, non-prescription drugs, or dietary supplements you use. Also tell them if you smoke, drink alcohol, or use illegal drugs. Some items may interact with your medicine. What should I watch for while using this medicine? Visit your doctor or health care professional for regular checks on your progress. Because your condition and the use of this medicine carries some risk, it is a good idea to carry an identification  card, necklace or bracelet with details of your condition, medications and doctor or health care professional. Check your blood pressure and pulse rate regularly. Ask your health care professional what your blood pressure and pulse rate should be, and when you should contact him or her. Your doctor or health care professional also may schedule regular blood tests and electrocardiograms to check your progress. You may get drowsy or dizzy. Do not drive, use machinery, or do anything that needs mental alertness until you know how this medicine affects you. Do not stand or sit up quickly, especially if you are an older patient. This reduces the risk of dizzy or fainting spells. Alcohol can make you more dizzy, increase flushing and rapid heartbeats. Avoid alcoholic drinks. What side effects may I notice from receiving this medicine? Side effects that you should report to your doctor or health care professional as soon as possible: -chest pain, continued irregular heartbeats -difficulty breathing -swelling of the legs or feet -trembling, shaking -unusually weak or tired Side effects that usually do not require medical attention (report to your doctor or health care professional if they continue or are bothersome): -blurred vision -constipation -headache -nausea, vomiting -stomach pain This list may not describe all possible side effects. Call your doctor for medical advice about side effects. You may report side effects to FDA at 1-800-FDA-1088. Where should I keep my medicine? Keep out of the reach of children. Store at room temperature between 15 and 30 degrees C (59 and 86 degrees F). Protect from light. Keep container tightly closed. Throw away any unused medicine after the expiration date. NOTE:  This sheet is a summary. It may not cover all possible information. If you have questions about this medicine, talk to your doctor, pharmacist, or health care provider.    2016, Elsevier/Gold Standard.  (2007-06-14 16:46:09) This website has more information on Xarelto: [email or web address]

## 2015-05-23 NOTE — Progress Notes (Signed)
Pt went back into rapid afib 130s-140s. Pt stated "I knew I went back in it. I could feel it." Pt sitting on side of bed in no acute distress. BP 0000000 systolic. Cardiology PA on floor and made aware. Order received to put pt back on IV cardizem. Order carried out. Drip initiated at 5mg /hr. Will continue to monitor closely.

## 2015-05-23 NOTE — Plan of Care (Signed)
Problem: Cardiac: Goal: Ability to achieve and maintain adequate cardiopulmonary perfusion will improve Outcome: Progressing Successful chemical cardioversion with continuous IV diltiazem overnight. See previous progress notes.

## 2015-05-23 NOTE — Consult Note (Signed)
ELECTROPHYSIOLOGY CONSULT NOTE    Patient ID: [name] MRN: [id number], DOB/AGE: [birth year] 73 y.o.  Admit date: 05/22/2015 Date of Consult: 05/23/2015  Primary Physician: Hoyt Koch, MD Primary Cardiologist: Dr. Harrington Challenger  Reason for Consultation: Atrial fibrillation  HPI: [name] is a 75 y.o. female with PMHx of PAFib, previously declined a/c, though now has been started on Xarelto, HTN, DM, and CBP, asthma, obesity, OSA she states was evaluated 3 years ago andmild not requiring CPAP, was admitted to [facility]  05/22/15 with c/o palpitations and found in AF with RVR.  She was noted to have rates 160 and started on cardizem gtt though stopped with hypotension and started on PO cardizem, up-titrated from her home dose with plans to discharge today, infortunately she went back into RAFib and her d/c was held placed back in dilt gtt with spontaneous CV to SR once again,  EP asked to evaluate.      The patient states that she feels the AF what she describes as mild brief episodes sometimes daily or a couple times a week, states "severe" episodes with HRs 160's or higher are less frequent, but has had a couple now in the last 2-3 months.  She has noted these have been in the early morning or overnight hours.  These episodes very symptomatic, feeling weak, mentios increased urination and even diarrhea with them and SOB.  She denies CP to me but is mentioned in her chart.  Presenting labs notable for  WBC 17.6 >>> 10.6 poc Trop 0.02   AFib hx: 2012 AFib 06/01/14 [facility] Jan 2017 ER visit with AFib RVR with spontaneous CV to SR  Treated with cardizem and one other drug she cannot recall the name remotely that bothered her blood sugar She has reported wheezing with betablocker in the past No hx of AAD    Past Medical History  Diagnosis Date  . Allergic rhinitis   . Asthma   . Gout     takes Colchicine prn  . Peptic ulcer disease   . History of PSVT (paroxysmal supraventricular  tachycardia)   . HLD (hyperlipidemia)     pt doesn't take any medication for this  . DJD (degenerative joint disease)   . History of blood clots     right lung in early 90's  . Sleep apnea     doesn't use CPAP  . Shortness of breath     pt states that she can be sitting as well as exertion and get short of breath  . Bronchitis     in Jan 2013-uses Albuterol prn  . History of migraine headaches     last migraine 4yrs ago;Pt states she does get sinus headaches  . Osteoarthritis   . Chronic back pain     hx buldging disc  . Rash     on neck and started after she started taking her beta blocker 48yrs ago;medical MD has seen this  . Gastric ulcer   . Hemorrhoids   . Family hx of colon cancer   . Diabetes mellitus     glimepiride  . Yeast infection of the vagina     being treated for this now by dr.fontaine  . Bronchitis, acute 03-01-11    FINSHED Z-PAK-STILL ON PREDNISONE NOW  . Peripheral edema     takes Furosemide every other day  . Dizziness     occasionally  . Constipation     takes Colace prn  . Colon cancer ([place])  2007    s/p surgery and chemo  . Urinary frequency   . Nocturia   . Anxiety     and panic attacks;pt states that she is claustrophobic  . Family history of anesthesia complication     daughter gets PONV  . Hypertension     takes Micardis and Diltiazem daily  . Atrial fibrillation ([place])     takes Coumadin daily-instructed to stop  . Spinal headache   . Numbness     left hand  . Fibromyalgia     but doesn't take any meds  . GERD (gastroesophageal reflux disease)     uses Tums prn  . History of colon polyps   . Urinary frequency   . Peripheral edema     takes Lasix daily  . History of shingles      Surgical History:  Past Surgical History  Procedure Laterality Date  . Caesarean section  1966/69/72    x 3  . Heel spur surgery  1992    x 2  . Ankle surgery      tumor-benign removed left ankle  . Lumbar disc surgery  2008  . Pyelonidal cystectomy       at age 65  . Knee arthrosocpy      bil;couple of years before knee replacement  . Ganglion cyst excision  > 32yrs ago    removed from left pointer finger  . Cataract surgery  2010  . Port a cath placed  2008  . Port a cath removed  2008  . Knee surgery      left TKA  . Anterior cervical decomp/discectomy fusion  01/12/2011    Procedure: ANTERIOR CERVICAL DECOMPRESSION/DISCECTOMY FUSION 2 LEVELS;  Surgeon: Cooper Render Pool;  Location: [facility];  Service: Neurosurgery;  Laterality: Bilateral;  Cervical five-six, six-seven anterior cervical discectomy and fusion with allograft and plating  . Hysteroscopy w/d&c  02/19/2011    Procedure: DILATATION AND CURETTAGE /HYSTEROSCOPY;  Surgeon: Anastasio Auerbach, MD;  Location: [facility];  Service: Gynecology;  Laterality: N/A;  requests one hour  . Dilation and curettage of uterus  02-19-11    & POLYP REMOVAL  . Anterior cervical decomp/discectomy fusion  06/11/2011    Procedure: ANTERIOR CERVICAL DECOMPRESSION/DISCECTOMY FUSION 1 LEVEL/HARDWARE REMOVAL;  Surgeon: Charlie Pitter, MD;  Location: [place] [facility];  Service: Neurosurgery;  Laterality: N/A;  Cervical four-five anterior cervical decompression fusion with allograft and plating; Removal of Cervical five to seven plate  . Joint replacement Left     knee  . Colectomy  2007    colon cancer  . Cardiac catheterization  90's and 2005  . Tubal ligation  1972  . Colonoscopy    . Bilateral cataracts removed    . Shoulder arthroscopy with subacromial decompression and open rotator c Right 03/16/2013    Procedure: RIGHT SHOULDER ARTHROSCOPY WITH SUBACROMIAL DECOMPRESSION AND OPEN ROTATOR CUFF REPAIR, OPEN DISTAL CLAVICLE  RESECTION POSSIBLE BICEP TENODESIS ;  Surgeon: Augustin Schooling, MD;  Location: [facility];  Service: Orthopedics;  Laterality: Right;  . Carpal tunnel release Left 11/2013  . Trigger finger release Left     thumb, tendonitis, tumor removed     Prescriptions prior to admission   Medication Sig Dispense Refill Last Dose  . ALPRAZolam (XANAX) 0.5 MG tablet Take 0.5 mg by mouth 3 (three) times daily as needed for anxiety or sleep.   Past Month at Unknown time  . aspirin 325 MG tablet Take 650 mg by  mouth once.   05/22/2015 at Unknown time  . glimepiride (AMARYL) 4 MG tablet TAKE 1 TABLET (4 MG TOTAL) BY MOUTH DAILY WITH BREAKFAST. 90 tablet 3 05/21/2015 at Unknown time  . loperamide (IMODIUM) 2 MG capsule Take 4 mg by mouth as needed for diarrhea or loose stools.   05/22/2015 at Unknown time  . losartan (COZAAR) 100 MG tablet Take 1 tablet (100 mg total) by mouth daily. 90 tablet 3 05/21/2015 at Unknown time  . promethazine (PHENERGAN) 25 MG tablet Take 25 mg by mouth every 6 (six) hours as needed for nausea or vomiting.   05/22/2015 at Unknown time  . traMADol (ULTRAM) 50 MG tablet Take 50 mg by mouth 3 (three) times daily as needed for moderate pain.   Past Week at Unknown time  . [DISCONTINUED] diltiazem (CARDIZEM CD) 240 MG 24 hr capsule TAKE ONE CAPSULE BY MOUTH EVERY DAY 30 capsule 11 05/22/2015 at Unknown time  . albuterol (PROVENTIL HFA;VENTOLIN HFA) 108 (90 BASE) MCG/ACT inhaler Inhale 2 puffs into the lungs every 6 (six) hours as needed for wheezing or shortness of breath.   rescue  . atorvastatin (LIPITOR) 10 MG tablet Take 1 tablet (10 mg total) by mouth daily. (Patient not taking: Reported on 05/22/2015) 90 tablet 3   . furosemide (LASIX) 40 MG tablet Take 40 mg by mouth daily.    05/20/2015  . glucose blood (ONE TOUCH ULTRA TEST) test strip 1 each by Other route 2 (two) times daily as needed for other. for testing 100 each 3 Taking  . pantoprazole (PROTONIX) 40 MG tablet Take 1 tablet (40 mg total) by mouth daily. (Patient not taking: Reported on 05/22/2015) 30 tablet 3 Taking    Inpatient Medications:  . atorvastatin  10 mg Oral q1800  . furosemide  40 mg Oral Daily  . insulin aspart  0-15 Units Subcutaneous TID WC  . losartan  100 mg Oral Daily  . pantoprazole  40  mg Oral Daily  . rivaroxaban  20 mg Oral Q supper    Allergies:  Allergies  Allergen Reactions  . Benazepril Other (See Comments)    coughing  . Adhesive [Tape] Other (See Comments)    redness  . Morphine Other (See Comments)       Makes you feel bad all over  . Nifedipine Hives and Itching  . Piroxicam Hives and Itching    Social History   Social History  . Marital Status: Married    Spouse Name: N/A  . Number of Children: 3  . Years of Education: N/A   Occupational History  . retired    Social History Main Topics  . Smoking status: Former Smoker -- 1.50 packs/day for 30 years    Types: Cigarettes    Quit date: 02/23/1991  . Smokeless tobacco: Never Used     Comment: quit smoking 66yrs ago  . Alcohol Use: No  . Drug Use: No  . Sexual Activity:    Partners: Male    Birth Control/ Protection: Post-menopausal   Other Topics Concern  . Not on file   Social History Narrative   Regular exercise- no.     Family History  Problem Relation Age of Onset  . Coronary artery disease Other   . Heart attack Mother   . Stroke Mother   . Hypertension Mother   . Heart disease Father   . Hypertension Sister   . Kidney cancer Sister     left  . Bladder Cancer Brother   .  Liver cancer Brother     spread to colon  . Anesthesia problems Neg Hx   . Hypotension Neg Hx   . Malignant hyperthermia Neg Hx   . Pseudochol deficiency Neg Hx      Review of Systems: All other systems reviewed and are otherwise negative except as noted above.  Physical Exam: Filed Vitals:   05/22/15 2349 05/23/15 0355 05/23/15 0500 05/23/15 1110  BP: 136/67 138/66  142/72  Pulse: 61 62    Temp: 98.5 F (36.9 C) 98.3 F (36.8 C)    TempSrc: Oral Oral    Resp: 18 18    Height:      Weight:   236 lb 14.4 oz (107.457 kg)   SpO2: 96% 97%       GEN- The patient is obese, well appearing, alert and oriented x 3 today.   HEENT: normocephalic, atraumatic; sclera clear, conjunctiva pink;  hearing intact; oropharynx clear; neck supple, no JVP Lymph- no cervical lymphadenopathy Lungs- Clear to ausculation bilaterally, normal work of breathing.  No wheezes, rales, rhonchi Heart- Regular rate and rhythm, no murmurs, rubs or gallops, PMI not laterally displaced GI- soft, non-tender, non-distended Extremities- no clubbing, cyanosis MS- no significant deformity or atrophy Skin- warm and dry, no rash or lesion Psych- euthymic mood, full affect Neuro- no gross deficits observed  Labs:   Lab Results  Component Value Date   WBC 10.6* 05/22/2015   HGB 14.6 05/22/2015   HCT 44.3 05/22/2015   MCV 86.9 05/22/2015   PLT 264 05/22/2015    Recent Labs Lab 05/22/15 1827  NA 138  K 3.9  CL 102  CO2 27  BUN 23*  CREATININE 1.18*  CALCIUM 8.6*  GLUCOSE 199*      Radiology/Studies:  Dg Chest Port 1 View 05/22/2015  CLINICAL DATA:  75 year old female with chest pain. Initial encounter. EXAM: PORTABLE CHEST 1 VIEW COMPARISON:  01/24/2015 and earlier. FINDINGS: Portable AP upright view at 1039 hours. Normal lung volumes. Normal cardiac size and mediastinal contours. Visualized tracheal air column is within normal limits. Allowing for portable technique, the lungs are clear. No pneumothorax. Calcified aortic atherosclerosis. IMPRESSION: No acute cardiopulmonary abnormality. Electronically Signed   By: Genevie Ann M.D.   On: 05/22/2015 10:49    EKG: #1 Afib 161bpm #2 SR TELEMETRY: SR with PAFib rates 120's-150's, currently in SR 05/23/15: Echocardiogram Study Conclusions - Left ventricle: The cavity size was normal. There was moderate  focal basal and mild concentric hypertrophy of the left  ventricle. Systolic function was vigorous. The estimated ejection  fraction was in the range of 65% to 70%. Wall motion was normal;  there were no regional wall motion abnormalities. Doppler  parameters are consistent with abnormal left ventricular  relaxation (grade 1 diastolic  dysfunction). Doppler parameters  are consistent with elevated ventricular end-diastolic filling  pressure. - Aortic valve: Valve mobility was restricted. There was mild  stenosis. There was mild regurgitation. Mean gradient (S): 8 mm  Hg. Peak gradient (S): 17 mm Hg. - Aortic root: The aortic root was normal in size. - Mitral valve: Calcified annulus. Mildly thickened leaflets . - Right atrium: The atrium was mildly dilated. - Tricuspid valve: There was mild regurgitation. - Pulmonary arteries: Systolic pressure was within the normal  range. - Inferior vena cava: The vessel was normal in size. Impressions: - Aortic valve is thickened and calcified with limited leaflet  opening.  There is mild aortic stenosis that is most probably  underestimated sec to  suboptimal angle during acquisition.  Mild aortic regirgitation.  LA 7mm  Prior notes mention Normal coronaries 2005 by cath  04/30/11 Lexiscan stress test Overall Impression: Normal stress nuclear study.  LV Ejection Fraction: 70  Assessment and Plan:   1. Paroxysmal Afib     Check TSH, suggest she revisit OSA     CHA2DS2Vasc is at least 4, started on Xarelto this admission     Discussed initiating AAD though she is leery of potential side effects of stronger medications. he suspects that her diltiazem is running out before the 24hours is up and 1st would like to try taking it twice daily to see if this will give her better management, this she states she already discussed with Dr. Marlou Porch We had a lengthy discussion on her stroke risk, she tried coumadin on a number of occassions and felt terrible with it, Xarelto is going to be very expensive and is looking into financial programs that may be able to take care of her copay, also suggested she inquire with her insurance company what their preferred medication is.  Also suggest AFib clinic f/u  Dr. Curt Bears to see   2. HTN   Signed, Tommye Standard,  PA-C 05/23/2015 12:21 PM    I have seen and examined this patient with Tommye Standard.  Agree with above, note added to reflect my findings.  On exam, regular rhythm, no murmurs, lungs clear.  Has AF and presented with RVR yesterday.  Converted to sinus rhythm with return to AF with RVR while in the hospital.  Back in sinus rhythm now.  Will take Xarelto for a CHADS2VASc of 4.  Discussed rhythm control with the patient and have agreed on flecainide 50 mg BID for therapy.  She has no obvious structural heart disease and no chest pain to indicate CAD.    Will M. Camnitz MD 05/23/2015 2:55 PM

## 2015-05-23 NOTE — Progress Notes (Signed)
Hold discharge as patient back in afib with RVR. He patient does feels palpations and week. Will start IV cardizem. Continue Xarelto for anticoagulation. Consider EP consult for antiarrthymic vs ablation for recurrent afib.   [name], [place]

## 2015-05-23 NOTE — Discharge Summary (Signed)
Discharge Summary    Patient ID: [name],  MRN: [id number], DOB/AGE: 1940/11/20 75 y.o.  Admit date: 05/22/2015 Discharge date: 05/23/2015  Primary Care Provider: Hoyt Cooper Primary Cardiologist: Dr. Harrington Challenger  Discharge Diagnoses    Principal Problem:   Atrial fibrillation with RVR [facility]) Active Problems:   Diabetes mellitus type 2, uncomplicated ([place])   Essential hypertension   Asthma   History of malignant neoplasm of large intestine   Hyperlipidemia   Chronic back pain   Leucocytosis   AKI   Allergies Allergies  Allergen Reactions  . Benazepril Other (See Comments)    coughing  . Adhesive [Tape] Other (See Comments)    redness  . Morphine Other (See Comments)       Makes you feel bad all over  . Nifedipine Hives and Itching  . Piroxicam Hives and Itching    Diagnostic Studies/Procedures    LV EF: 65% - 70%  ------------------------------------------------------------------- Indications: Atrial fibrillation - 427.31.  ------------------------------------------------------------------- History: Risk factors: Former tobacco use. Hypertension. Diabetes mellitus. Dyslipidemia.  ------------------------------------------------------------------- Study Conclusions  - Left ventricle: The cavity size was normal. There was moderate  focal basal and mild concentric hypertrophy of the left  ventricle. Systolic function was vigorous. The estimated ejection  fraction was in the range of 65% to 70%. Wall motion was normal;  there were no regional wall motion abnormalities. Doppler  parameters are consistent with abnormal left ventricular  relaxation (grade 1 diastolic dysfunction). Doppler parameters  are consistent with elevated ventricular end-diastolic filling  pressure. - Aortic valve: Valve mobility was restricted. There was mild  stenosis. There was mild regurgitation. Mean gradient (S): 8 mm  Hg. Peak gradient (S): 17  mm Hg. - Aortic root: The aortic root was normal in size. - Mitral valve: Calcified annulus. Mildly thickened leaflets . - Right atrium: The atrium was mildly dilated. - Tricuspid valve: There was mild regurgitation. - Pulmonary arteries: Systolic pressure was within the normal  range. - Inferior vena cava: The vessel was normal in size.  Impressions:  - Aortic valve is thickened and calcified with limited leaflet  opening.  There is mild aortic stenosis that is most probably  underestimated sec to suboptimal angle during acquisition.  Mild aortic regirgitation.   History of Present Illness     [name] is a 75 y.o. female with a history of h/o PAF, HTN, chronic back pain, colon cancer (2007 this was a right-sided lesion T2 N1) and DM who presented to [facility] ED for evaluation of palpitations.   The patient seen in ED for atrial fibrillation w/ RVR on 03/08/15. Per notes, she converted spontaneously in the ED.Since then seen in clinic x 2. Last seen by Dr. Harrington Challenger 04/11/15. She was in sinus rhythm both time. Despite a CHA2DS2 VASc score of at least 4 for HTN, DM, Age [age] and female sex, she is not on anticoagulation. Chart review shows that she was on Coumadin in the past. The patient reports that she self discontinued it due to "intolerance". She reports that she "could not function with it". She is on Cardizem for rate control. She has refused anticoagulation during both outpatient visit. She states that she cannot take aspirin because it causes abdominal pain. Prior wheezing on BB.   Patient states that she continues to have frequent episodes of "chest fluttering". Occurs a few times in a week and resolved by itself within 2-3 minutes by itself. Last episode started around 1 AM 05/22/15.  Due to continued palpitations she took her Cardizem and aspirin 325 mg without improvement. She endores having pain between shoulder blade since onset of symptoms. Some intermittent chest tightness  and shortness of breath as well. Patient states that she has a chronic back pain and unable to lay flat. Denies lower extremity edema, syncope, orthopnea, PND or blood in her stool or urine. States that as she frequently has loose stool with A. Fib.  In ED patient found to have a A. fib with RVR at a rate of 161 bpm and started on IV Cardizem--> due to hypotension 83/56, Cardizem is discontinue. Blood pressure improved. Currently at rate of 90s-100s. Serum creatinine of 1.02, point-of-care troponin negative. WBC 17.6 chest x-ray without acute cardiopulmonary disease.  Last echocardiogram 08/2010 shows left ventricular function of 55-60%, mild aortic regurgitation  Hospital Course     Consultants: EP  AFIB: The patient was admitted and started on cardizem 55m q 6 hours (increased home dose) and Xarelto for anticoagulation. However she developed sustained rate of 130s on floor with SBP of 130-140s. She was again started on IV cardizem and converted to sinus rhythm overnight. Plan made to discharge her however again went into afib with RVR. Started IV Cardizem again and converted to sinus rhythm. She was seen by EP eventually due to recurrent afib and started her on Flecainide 547mBID.  Echo during admission showed LE Function of 65-70%, grade 1 DD, mild AS.   AKI: minimally elevated Scr. She is on Losartan 10045md and lasix 13m63m. Minimally elevated Scr. Recheck  BMET during post hospital visit. Adjust medicine as needed.   The patient has [facility] and deemed ready for discharge home. All follow-up appointments have been scheduled. Discharge medications are listed below.   She will f/u in afib clinic. Signed patient assistance forms for Xarelto. Xarelto is covered and copay is $374.57, patient has not met deductible.[facility]. Given scrip for free 30 days supplies of xarelto.   Discharge Vitals Blood pressure 153/87,  pulse 62, temperature 98.3 F (36.8 C), temperature source Oral, resp. rate 18, height 5' 3"  (1.6 m), weight 236 lb 14.4 oz (107.457 kg), last menstrual period 12/30/2000, SpO2 97 %.  Filed Weights   05/22/15 1004 05/22/15 1646 05/23/15 0500  Weight: 238 lb (107.956 kg) 239 lb (108.41 kg) 236 lb 14.4 oz (107.457 kg)    Labs & Radiologic Studies     CBC  Recent Labs  05/22/15 1004 05/22/15 1827  WBC 17.6* 10.6*  HGB 16.4* 14.6  HCT 49.4* 44.3  MCV 85.6 86.9  PLT 346 264 703asic Metabolic Panel  Recent Labs  05/22/15 1004 05/22/15 1827  NA 140 138  K 3.9 3.9  CL 106 102  CO2 21* 27  GLUCOSE 159* 199*  BUN 21* 23*  CREATININE 1.02* 1.18*  CALCIUM 9.2 8.6*  Hemoglobin A1C  Recent Labs  05/22/15 1659  HGBA1C 7.8*   Fasting Lipid Panel No results for input(s): CHOL, HDL, LDLCALC, TRIG, CHOLHDL, LDLDIRECT in the last 72 hours. Thyroid Function Tests  Recent Labs  05/23/15 1408  TSH 1.339    Dg Chest Port 1 View  05/22/2015  CLINICAL DATA:  75 y63r old female with chest pain. Initial encounter. EXAM: PORTABLE CHEST 1 VIEW COMPARISON:  01/24/2015 and earlier. FINDINGS: Portable AP upright view at 1039 hours. Normal lung volumes. Normal cardiac size and mediastinal contours. Visualized tracheal  air column is within normal limits. Allowing for portable technique, the lungs are clear. No pneumothorax. Calcified aortic atherosclerosis. IMPRESSION: No acute cardiopulmonary abnormality. Electronically Signed   By: Genevie Ann M.D.   On: 05/22/2015 10:49    Disposition   Pt is being discharged home today in good condition.  Follow-up Plans & Appointments    Follow-up Information    Follow up with CARROLL,DONNA, NP. Go on 05/28/2015.   Specialties:  Nurse Practitioner, Cardiology   Why:  @2 :30 am for post hosptial (afib clinic)-> please call for direction, parking garage code [id number]   Contact information:   [place] [address] [phone]       Discharge Instructions    Diet - low sodium heart healthy    Complete by:  As directed      Increase activity slowly    Complete by:  As directed            Discharge Medications   Discharge Medication List as of 05/23/2015  3:27 PM    START taking these medications   Details  flecainide (TAMBOCOR) 50 MG tablet Take 1 tablet (50 mg total) by mouth 2 (two) times daily., Starting 05/23/2015, Until Discontinued, Normal    rivaroxaban (XARELTO) 20 MG TABS tablet Take 1 tablet (20 mg total) by mouth daily with supper., Starting 05/23/2015, Until Discontinued, Normal      CONTINUE these medications which have CHANGED   Details  diltiazem (CARDIZEM CD) 360 MG 24 hr capsule Take 1 capsule (360 mg total) by mouth daily., Starting 05/23/2015, Until Discontinued, Normal      CONTINUE these medications which have NOT CHANGED   Details  ALPRAZolam (XANAX) 0.5 MG tablet Take 0.5 mg by mouth 3 (three) times daily as needed for anxiety or sleep., Until Discontinued, Historical Med    glimepiride (AMARYL) 4 MG tablet TAKE 1 TABLET (4 MG TOTAL) BY MOUTH DAILY WITH BREAKFAST., Normal    loperamide (IMODIUM) 2 MG capsule Take 4 mg by mouth as needed for diarrhea or loose stools., Until Discontinued, Historical Med    losartan (COZAAR) 100 MG tablet Take 1 tablet (100 mg total) by mouth daily., Starting 02/28/2015, Until Discontinued, Normal    promethazine (PHENERGAN) 25 MG tablet Take 25 mg by mouth every 6 (six) hours as needed for nausea or vomiting., Until Discontinued, Historical Med    traMADol (ULTRAM) 50 MG tablet Take 50 mg by mouth 3 (three) times daily as needed for moderate pain., Until Discontinued, Historical Med    albuterol (PROVENTIL HFA;VENTOLIN HFA) 108 (90 BASE) MCG/ACT inhaler Inhale 2 puffs into the lungs every 6 (six) hours as needed for wheezing or shortness of breath., Until Discontinued, Historical Med    atorvastatin (LIPITOR) 10 MG tablet Take 1 tablet (10 mg total) by  mouth daily., Starting 04/11/2015, Until Discontinued, Normal    furosemide (LASIX) 40 MG tablet Take 40 mg by mouth daily. , Until Discontinued, Historical Med    glucose blood (ONE TOUCH ULTRA TEST) test strip 1 each by Other route 2 (two) times daily as needed for other. for testing, Starting 02/28/2015, Until Discontinued, Normal    pantoprazole (PROTONIX) 40 MG tablet Take 1 tablet (40 mg total) by mouth daily., Starting 09/30/2014, Until Discontinued, Normal      STOP taking these medications     aspirin 325 MG tablet            Outstanding Labs/Studies   BEMT during post hospital visit  Duration of Discharge Encounter   Greater than 30 minutes including physician time.  Signed, Bhagat,Bhavinkumar PA-C 05/23/2015, 4:07 PM   Increase dilt CD to 363m from 240 at home Start Flecainide 544mBID (Appreciate Dr. CaCurt Bearsnd EP team consult) DC  RRR, CTAB. Feels good. Ready to go.   SKCandee FurbishMD

## 2015-05-23 NOTE — Progress Notes (Signed)
Pt noted to be back in NSR.  After reviewing tele strips it appears patient converted ~ 1130.  IV cardizem still running at 5mg /hr, VSS. Cardiology PA paged and made aware. Waiting return call. Will continue to monitor patient closely

## 2015-05-23 NOTE — Care Management Note (Addendum)
Case Management Note  Patient Details  Name: [name] MRN: [id number] Date of Birth: Jun 27, 1940  Subjective/Objective:     Pt admitted for Atrial Fibrillation. Plan for home on Xarelto. CM will Provide pt with 30 day free card and co pay cost.                Action/Plan: Benefits Check Completed: Xarelto is covered and copay is $374.57, patient has not met deductible.[facility]. No Prior Auth Needed. Thanks! #0 day free card provided. Pt states co pay will be too expensive. Please fill out patient assistance forms for Xarelto. Forms will be placed on shadow chart. [facility] [place] [facility] available. No further needs from CM at this time.    Expected Discharge Date:                  Expected Discharge Plan:  Home/Self Care  In-House Referral:  NA  Discharge planning Services  CM Consult, Medication Assistance  Post Acute Care Choice:  NA Choice offered to:  NA  DME Arranged:  N/A DME Agency:  NA  HH Arranged:  NA HH Agency:  NA  Status of Service:  Completed, signed off  Medicare Important Message Given:    Date Medicare IM Given:    Medicare IM give by:    Date Additional Medicare IM Given:    Additional Medicare Important Message give by:     If discussed at [place] of Stay Meetings, dates discussed:    Additional Comments:  Julie Roys, RN 05/23/2015, 10:23 AM

## 2015-05-23 NOTE — Progress Notes (Signed)
  Echocardiogram 2D Echocardiogram has been performed.  Jennette Dubin 05/23/2015, 9:37 AM

## 2015-05-23 NOTE — Progress Notes (Addendum)
    Will stop dilt drip. Increase dilt CD to 360mg  from 240 at home Anticipate DC  RRR, CTAB. Feels good. Ready to go.   Candee Furbish, MD

## 2015-05-28 ENCOUNTER — Ambulatory Visit (HOSPITAL_COMMUNITY): Payer: Medicare Other | Admitting: Nurse Practitioner

## 2015-05-29 ENCOUNTER — Inpatient Hospital Stay (HOSPITAL_COMMUNITY): Admission: RE | Admit: 2015-05-29 | Payer: Medicare Other | Source: Ambulatory Visit | Admitting: Nurse Practitioner

## 2015-05-30 ENCOUNTER — Encounter (HOSPITAL_COMMUNITY): Payer: Self-pay | Admitting: *Deleted

## 2015-06-02 ENCOUNTER — Encounter: Payer: Medicare Other | Admitting: Cardiology

## 2015-06-11 ENCOUNTER — Other Ambulatory Visit: Payer: Medicare Other

## 2015-07-15 ENCOUNTER — Emergency Department (HOSPITAL_COMMUNITY)
Admission: EM | Admit: 2015-07-15 | Discharge: 2015-07-15 | Disposition: A | Discharge status: Home / Self Care | Payer: Medicare Other | Attending: Emergency Medicine | Admitting: Emergency Medicine

## 2015-07-15 ENCOUNTER — Encounter (HOSPITAL_COMMUNITY): Payer: Self-pay

## 2015-07-15 DIAGNOSIS — Z79899 Other long term (current) drug therapy: Secondary | ICD-10-CM | POA: Diagnosis not present

## 2015-07-15 DIAGNOSIS — J45909 Unspecified asthma, uncomplicated: Secondary | ICD-10-CM | POA: Insufficient documentation

## 2015-07-15 DIAGNOSIS — Z8619 Personal history of other infectious and parasitic diseases: Secondary | ICD-10-CM | POA: Insufficient documentation

## 2015-07-15 DIAGNOSIS — I10 Essential (primary) hypertension: Secondary | ICD-10-CM | POA: Insufficient documentation

## 2015-07-15 DIAGNOSIS — I4891 Unspecified atrial fibrillation: Secondary | ICD-10-CM

## 2015-07-15 DIAGNOSIS — F419 Anxiety disorder, unspecified: Secondary | ICD-10-CM | POA: Insufficient documentation

## 2015-07-15 DIAGNOSIS — Z7982 Long term (current) use of aspirin: Secondary | ICD-10-CM | POA: Diagnosis not present

## 2015-07-15 DIAGNOSIS — E785 Hyperlipidemia, unspecified: Secondary | ICD-10-CM | POA: Insufficient documentation

## 2015-07-15 DIAGNOSIS — Z86010 Personal history of colonic polyps: Secondary | ICD-10-CM | POA: Diagnosis not present

## 2015-07-15 DIAGNOSIS — G8929 Other chronic pain: Secondary | ICD-10-CM | POA: Diagnosis not present

## 2015-07-15 DIAGNOSIS — M1990 Unspecified osteoarthritis, unspecified site: Secondary | ICD-10-CM | POA: Diagnosis not present

## 2015-07-15 DIAGNOSIS — F17210 Nicotine dependence, cigarettes, uncomplicated: Secondary | ICD-10-CM | POA: Insufficient documentation

## 2015-07-15 DIAGNOSIS — R0602 Shortness of breath: Secondary | ICD-10-CM | POA: Diagnosis present

## 2015-07-15 DIAGNOSIS — Z7901 Long term (current) use of anticoagulants: Secondary | ICD-10-CM | POA: Diagnosis not present

## 2015-07-15 DIAGNOSIS — I480 Paroxysmal atrial fibrillation: Secondary | ICD-10-CM | POA: Diagnosis not present

## 2015-07-15 DIAGNOSIS — E119 Type 2 diabetes mellitus without complications: Secondary | ICD-10-CM | POA: Insufficient documentation

## 2015-07-15 DIAGNOSIS — Z85038 Personal history of other malignant neoplasm of large intestine: Secondary | ICD-10-CM | POA: Insufficient documentation

## 2015-07-15 LAB — BASIC METABOLIC PANEL
ANION GAP: 12 (ref 5–15)
BUN: 10 mg/dL (ref 6–20)
CALCIUM: 9.8 mg/dL (ref 8.9–10.3)
CO2: 22 mmol/L (ref 22–32)
Chloride: 103 mmol/L (ref 101–111)
Creatinine, Ser: 1.01 mg/dL — ABNORMAL HIGH (ref 0.44–1.00)
GFR, EST NON AFRICAN AMERICAN: 54 mL/min — AB (ref >60)
Glucose, Bld: 194 mg/dL — ABNORMAL HIGH (ref 65–99)
Potassium: 3.8 mmol/L (ref 3.5–5.1)
SODIUM: 137 mmol/L (ref 135–145)

## 2015-07-15 LAB — CBC WITH DIFFERENTIAL/PLATELET
BASOS ABS: 0 10*3/uL (ref 0.0–0.1)
Basophils Relative: 0 %
Eosinophils Absolute: 0.4 10*3/uL (ref 0.0–0.7)
Eosinophils Relative: 4 %
HEMATOCRIT: 44.3 % (ref 36.0–46.0)
Hemoglobin: 15.2 g/dL — ABNORMAL HIGH (ref 12.0–15.0)
LYMPHS ABS: 4 10*3/uL (ref 0.7–4.0)
LYMPHS PCT: 33 %
MCH: 29.9 pg (ref 26.0–34.0)
MCHC: 34.3 g/dL (ref 30.0–36.0)
MCV: 87 fL (ref 78.0–100.0)
MONO ABS: 0.7 10*3/uL (ref 0.1–1.0)
Monocytes Relative: 6 %
NEUTROS ABS: 7 10*3/uL (ref 1.7–7.7)
Neutrophils Relative %: 57 %
PLATELETS: 276 10*3/uL (ref 150–400)
RBC: 5.09 MIL/uL (ref 3.87–5.11)
RDW: 14.4 % (ref 11.5–15.5)
WBC: 12.1 10*3/uL — ABNORMAL HIGH (ref 4.0–10.5)

## 2015-07-15 LAB — TROPONIN I

## 2015-07-15 MED ORDER — SODIUM CHLORIDE 0.9 % IV SOLN: INTRAVENOUS | Status: DC

## 2015-07-15 NOTE — Discharge Instructions (Signed)

## 2015-07-15 NOTE — ED Notes (Signed)
Pt. Presents with complaint of afib. Pt. States SOB, tachycardia this AM around 0430 when she went to the bathroom. Denies CP. States has hx of same.

## 2015-07-15 NOTE — ED Provider Notes (Signed)
CSN: [id number]     Arrival date & time 07/15/15  [id number] History   First MD Initiated Contact with Patient 07/15/15 1001     Chief Complaint  Patient presents with  . Shortness of Breath     (Consider location/radiation/quality/duration/timing/severity/associated sxs/prior Treatment) HPI Comments: Patient here after she developed acute onset of irregular heartbeat approximate 6 hours prior to arrival. Has a history of paroxysmal atrial fibrillation and this is similar. Denies any associated chest pain but did have some dyspnea. Patient takes Cardizem has been compliant with that. Patient had been also prescribed flecainide but she has not taken this for over a month. Patient also is supposed to be on Xarelto but she has not been compliant with his due to financial reasons. She does continue to take a full dose aspirin daily. Denies any neurological symptoms at this time. Heart rate at home was up into the 120s. When she arrived here she was in rapid ventricular rate response up to 140-150. Patient spontaneously converted after an IV was placed  The history is provided by the patient and the spouse.    Past Medical History  Diagnosis Date  . Allergic rhinitis   . Asthma   . Gout     takes Colchicine prn  . Peptic ulcer disease   . History of PSVT (paroxysmal supraventricular tachycardia)   . HLD (hyperlipidemia)     pt doesn't take any medication for this  . DJD (degenerative joint disease)   . History of blood clots     right lung in early 90's  . Sleep apnea     doesn't use CPAP  . Shortness of breath     pt states that she can be sitting as well as exertion and get short of breath  . Bronchitis     in Jan 2013-uses Albuterol prn  . History of migraine headaches     last migraine 20yrs ago;Pt states she does get sinus headaches  . Osteoarthritis   . Chronic back pain     hx buldging disc  . Rash     on neck and started after she started taking her beta blocker 93yrs ago;medical  MD has seen this  . Gastric ulcer   . Hemorrhoids   . Family hx of colon cancer   . Diabetes mellitus     glimepiride  . Yeast infection of the vagina     being treated for this now by dr.fontaine  . Bronchitis, acute 03-01-11    FINSHED Z-PAK-STILL ON PREDNISONE NOW  . Peripheral edema     takes Furosemide every other day  . Dizziness     occasionally  . Constipation     takes Colace prn  . Colon cancer ([place]) 2007    s/p surgery and chemo  . Urinary frequency   . Nocturia   . Anxiety     and panic attacks;pt states that she is claustrophobic  . Family history of anesthesia complication     daughter gets PONV  . Hypertension     takes Micardis and Diltiazem daily  . Atrial fibrillation ([place])     takes Coumadin daily-instructed to stop  . Spinal headache   . Numbness     left hand  . Fibromyalgia     but doesn't take any meds  . GERD (gastroesophageal reflux disease)     uses Tums prn  . History of colon polyps   . Urinary frequency   . Peripheral edema  takes Lasix daily  . History of shingles    Past Surgical History  Procedure Laterality Date  . Caesarean section  1966/69/72    x 3  . Heel spur surgery  1992    x 2  . Ankle surgery      tumor-benign removed left ankle  . Lumbar disc surgery  2008  . Pyelonidal cystectomy      at age 3  . Knee arthrosocpy      bil;couple of years before knee replacement  . Ganglion cyst excision  > 85yrs ago    removed from left pointer finger  . Cataract surgery  2010  . Port a cath placed  2008  . Port a cath removed  2008  . Knee surgery      left TKA  . Anterior cervical decomp/discectomy fusion  01/12/2011    Procedure: ANTERIOR CERVICAL DECOMPRESSION/DISCECTOMY FUSION 2 LEVELS;  Surgeon: Cooper Render Pool;  Location: [facility];  Service: Neurosurgery;  Laterality: Bilateral;  Cervical five-six, six-seven anterior cervical discectomy and fusion with allograft and plating  . Hysteroscopy w/d&c  02/19/2011     Procedure: DILATATION AND CURETTAGE /HYSTEROSCOPY;  Surgeon: Anastasio Auerbach, MD;  Location: [facility];  Service: Gynecology;  Laterality: N/A;  requests one hour  . Dilation and curettage of uterus  02-19-11    & POLYP REMOVAL  . Anterior cervical decomp/discectomy fusion  06/11/2011    Procedure: ANTERIOR CERVICAL DECOMPRESSION/DISCECTOMY FUSION 1 LEVEL/HARDWARE REMOVAL;  Surgeon: Charlie Pitter, MD;  Location: [facility];  Service: Neurosurgery;  Laterality: N/A;  Cervical four-five anterior cervical decompression fusion with allograft and plating; Removal of Cervical five to seven plate  . Joint replacement Left     knee  . Colectomy  2007    colon cancer  . Cardiac catheterization  90's and 2005  . Tubal ligation  1972  . Colonoscopy    . Bilateral cataracts removed    . Shoulder arthroscopy with subacromial decompression and open rotator c Right 03/16/2013    Procedure: RIGHT SHOULDER ARTHROSCOPY WITH SUBACROMIAL DECOMPRESSION AND OPEN ROTATOR CUFF REPAIR, OPEN DISTAL CLAVICLE  RESECTION POSSIBLE BICEP TENODESIS ;  Surgeon: Augustin Schooling, MD;  Location: [facility];  Service: Orthopedics;  Laterality: Right;  . Carpal tunnel release Left 11/2013  . Trigger finger release Left     thumb, tendonitis, tumor removed   Family History  Problem Relation Age of Onset  . Coronary artery disease Other   . Heart attack Mother   . Stroke Mother   . Hypertension Mother   . Heart disease Father   . Hypertension Sister   . Kidney cancer Sister     left  . Bladder Cancer Brother   . Liver cancer Brother     spread to colon  . Anesthesia problems Neg Hx   . Hypotension Neg Hx   . Malignant hyperthermia Neg Hx   . Pseudochol deficiency Neg Hx    Social History  Substance Use Topics  . Smoking status: Former Smoker -- 1.50 packs/day for 30 years    Types: Cigarettes    Quit date: 02/23/1991  . Smokeless tobacco: Never Used     Comment: quit smoking 11yrs ago  . Alcohol Use: No   OB History     Gravida Para Term Preterm AB TAB SAB Ectopic Multiple Living   3 3        3      Review of Systems  All other systems  reviewed and are negative.     Allergies  Benazepril; Adhesive; Morphine; Nifedipine; and Piroxicam  Home Medications   Prior to Admission medications   Medication Sig Start Date End Date Taking? Authorizing Provider  albuterol (PROVENTIL HFA;VENTOLIN HFA) 108 (90 BASE) MCG/ACT inhaler Inhale 2 puffs into the lungs every 6 (six) hours as needed for wheezing or shortness of breath.    Historical Provider, MD  ALPRAZolam Duanne Moron) 0.5 MG tablet Take 0.5 mg by mouth 3 (three) times daily as needed for anxiety or sleep.    Historical Provider, MD  atorvastatin (LIPITOR) 10 MG tablet Take 1 tablet (10 mg total) by mouth daily. Patient not taking: Reported on 05/22/2015 04/11/15   Fay Records, MD  diltiazem (CARDIZEM CD) 360 MG 24 hr capsule Take 1 capsule (360 mg total) by mouth daily. 05/23/15   Bhavinkumar Bhagat, PA  flecainide (TAMBOCOR) 50 MG tablet Take 1 tablet (50 mg total) by mouth 2 (two) times daily. 05/23/15   Bhavinkumar Bhagat, PA  furosemide (LASIX) 40 MG tablet Take 40 mg by mouth daily.     Historical Provider, MD  glimepiride (AMARYL) 4 MG tablet TAKE 1 TABLET (4 MG TOTAL) BY MOUTH DAILY WITH BREAKFAST. 11/22/14   Hoyt Koch, MD  glucose blood (ONE TOUCH ULTRA TEST) test strip 1 each by Other route 2 (two) times daily as needed for other. for testing 02/28/15   Hoyt Koch, MD  loperamide (IMODIUM) 2 MG capsule Take 4 mg by mouth as needed for diarrhea or loose stools.    Historical Provider, MD  losartan (COZAAR) 100 MG tablet Take 1 tablet (100 mg total) by mouth daily. 02/28/15   Hoyt Koch, MD  pantoprazole (PROTONIX) 40 MG tablet Take 1 tablet (40 mg total) by mouth daily. Patient not taking: Reported on 05/22/2015 09/30/14   Amy S Esterwood, PA-C  promethazine (PHENERGAN) 25 MG tablet Take 25 mg by mouth every 6 (six) hours as needed  for nausea or vomiting.    Historical Provider, MD  rivaroxaban (XARELTO) 20 MG TABS tablet Take 1 tablet (20 mg total) by mouth daily with supper. 05/23/15   Bhavinkumar Bhagat, PA  traMADol (ULTRAM) 50 MG tablet Take 50 mg by mouth 3 (three) times daily as needed for moderate pain.    Historical Provider, MD   LMP 12/30/2000 Physical Exam  Constitutional: She is oriented to person, place, and time. She appears well-developed and well-nourished.  Non-toxic appearance. No distress.  HENT:  Head: Normocephalic and atraumatic.  Eyes: Conjunctivae, EOM and lids are normal. Pupils are equal, round, and reactive to light.  Neck: Normal range of motion. Neck supple. No tracheal deviation present. No thyroid mass present.  Cardiovascular: Normal rate, regular rhythm and normal heart sounds.  Exam reveals no gallop.   No murmur heard. Pulmonary/Chest: Effort normal and breath sounds normal. No stridor. No respiratory distress. She has no decreased breath sounds. She has no wheezes. She has no rhonchi. She has no rales.  Abdominal: Soft. Normal appearance and bowel sounds are normal. She exhibits no distension. There is no tenderness. There is no rebound and no CVA tenderness.  Musculoskeletal: Normal range of motion. She exhibits no edema or tenderness.  Neurological: She is alert and oriented to person, place, and time. She has normal strength. No cranial nerve deficit or sensory deficit. GCS eye subscore is 4. GCS verbal subscore is 5. GCS motor subscore is 6.  Skin: Skin is warm and dry. No abrasion and  no rash noted.  Psychiatric: She has a normal mood and affect. Her speech is normal and behavior is normal.  Nursing note and vitals reviewed.   ED Course  Procedures (including critical care time) Labs Review Labs Reviewed - No data to display  Imaging Review No results found. I have personally reviewed and evaluated these images and lab results as part of my medical decision-making.   EKG  Interpretation   Date/Time:  Tuesday Jul 15 2015 09:55:37 EDT Ventricular Rate:  155 PR Interval:    QRS Duration: 73 QT Interval:  286 QTC Calculation: 459 R Axis:   -56 Text Interpretation:  Atrial fibrillation with rapid V-rate Left anterior  fascicular block Abnormal R-wave progression, late transition new from  prior Confirmed by Tra Wilemon  MD, Kaymarie Wynn ([id number]) on 07/15/2015 10:05:19 AM      MDM   Final diagnoses:  None   Patient monitored here and no return to atrial fibrillation. She is in sinus rhythm. Case discussed with Dr. Gwenlyn Found from cardiology and he recommends no changes to her medications. He took her information and will have his office call to schedule a follow-up visit. Return precautions given   Lacretia Leigh, MD 07/15/15 1328

## 2015-07-28 ENCOUNTER — Inpatient Hospital Stay (HOSPITAL_COMMUNITY)
Admission: RE | Admit: 2015-07-28 | Discharge: 2015-07-28 | Disposition: A | Discharge status: Home / Self Care | Payer: Medicare Other | Source: Ambulatory Visit | Attending: Nurse Practitioner | Admitting: Nurse Practitioner

## 2015-08-12 ENCOUNTER — Other Ambulatory Visit: Payer: Self-pay | Admitting: Internal Medicine

## 2015-08-13 ENCOUNTER — Other Ambulatory Visit: Payer: Self-pay | Admitting: *Deleted

## 2015-08-13 MED ORDER — PROMETHAZINE HCL 25 MG PO TABS: 25.0000 mg | ORAL_TABLET | Freq: Four times a day (QID) | ORAL | Status: DC | PRN

## 2015-08-13 NOTE — Telephone Encounter (Signed)
Sent to pharmacy 

## 2015-08-25 ENCOUNTER — Other Ambulatory Visit: Payer: Self-pay | Admitting: *Deleted

## 2015-08-25 MED ORDER — FUROSEMIDE 40 MG PO TABS: 40.0000 mg | ORAL_TABLET | Freq: Every day | ORAL | Status: DC

## 2015-08-25 NOTE — Telephone Encounter (Signed)
Receive call pt states she is needing refill on her Lasix. Verified [facility].../lmb

## 2015-11-21 ENCOUNTER — Other Ambulatory Visit: Payer: Self-pay | Admitting: Internal Medicine

## 2015-12-13 ENCOUNTER — Other Ambulatory Visit: Payer: Self-pay | Admitting: Physician Assistant

## 2015-12-24 ENCOUNTER — Emergency Department (HOSPITAL_COMMUNITY): Payer: Medicare Other

## 2015-12-24 ENCOUNTER — Emergency Department (HOSPITAL_COMMUNITY)
Admission: EM | Admit: 2015-12-24 | Discharge: 2015-12-24 | Disposition: A | Discharge status: Home / Self Care | Payer: Medicare Other | Attending: Emergency Medicine | Admitting: Emergency Medicine

## 2015-12-24 ENCOUNTER — Encounter (HOSPITAL_COMMUNITY): Payer: Self-pay

## 2015-12-24 DIAGNOSIS — Z79899 Other long term (current) drug therapy: Secondary | ICD-10-CM | POA: Insufficient documentation

## 2015-12-24 DIAGNOSIS — E119 Type 2 diabetes mellitus without complications: Secondary | ICD-10-CM | POA: Insufficient documentation

## 2015-12-24 DIAGNOSIS — Y929 Unspecified place or not applicable: Secondary | ICD-10-CM | POA: Diagnosis not present

## 2015-12-24 DIAGNOSIS — Z87891 Personal history of nicotine dependence: Secondary | ICD-10-CM | POA: Diagnosis not present

## 2015-12-24 DIAGNOSIS — M79604 Pain in right leg: Secondary | ICD-10-CM | POA: Insufficient documentation

## 2015-12-24 DIAGNOSIS — Z85038 Personal history of other malignant neoplasm of large intestine: Secondary | ICD-10-CM | POA: Insufficient documentation

## 2015-12-24 DIAGNOSIS — W19XXXA Unspecified fall, initial encounter: Secondary | ICD-10-CM | POA: Insufficient documentation

## 2015-12-24 DIAGNOSIS — Z7984 Long term (current) use of oral hypoglycemic drugs: Secondary | ICD-10-CM | POA: Insufficient documentation

## 2015-12-24 DIAGNOSIS — I10 Essential (primary) hypertension: Secondary | ICD-10-CM | POA: Diagnosis not present

## 2015-12-24 DIAGNOSIS — J45909 Unspecified asthma, uncomplicated: Secondary | ICD-10-CM | POA: Diagnosis not present

## 2015-12-24 DIAGNOSIS — Y999 Unspecified external cause status: Secondary | ICD-10-CM | POA: Insufficient documentation

## 2015-12-24 DIAGNOSIS — Z7982 Long term (current) use of aspirin: Secondary | ICD-10-CM | POA: Diagnosis not present

## 2015-12-24 DIAGNOSIS — Z7901 Long term (current) use of anticoagulants: Secondary | ICD-10-CM | POA: Diagnosis not present

## 2015-12-24 DIAGNOSIS — M545 Low back pain: Secondary | ICD-10-CM | POA: Diagnosis not present

## 2015-12-24 DIAGNOSIS — Z96652 Presence of left artificial knee joint: Secondary | ICD-10-CM | POA: Insufficient documentation

## 2015-12-24 DIAGNOSIS — M79605 Pain in left leg: Secondary | ICD-10-CM | POA: Diagnosis not present

## 2015-12-24 DIAGNOSIS — Y939 Activity, unspecified: Secondary | ICD-10-CM | POA: Diagnosis not present

## 2015-12-24 MED ORDER — FENTANYL CITRATE (PF) 100 MCG/2ML IJ SOLN
25.0000 ug | Freq: Once | INTRAMUSCULAR | Status: AC
  Administered 2015-12-24: 25 ug via INTRAMUSCULAR
  Filled 2015-12-24: qty 2

## 2015-12-24 MED ORDER — HYDROCODONE-ACETAMINOPHEN 5-325 MG PO TABS: 1.0000 | ORAL_TABLET | Freq: Four times a day (QID) | ORAL | 0 refills | Status: DC | PRN

## 2015-12-24 NOTE — Discharge Instructions (Signed)
As discussed, your evaluation today has been largely reassuring.  But, it is important that you monitor your condition carefully, and do not hesitate to return to the ED if you develop new, or concerning changes in your condition. ? ?Otherwise, please follow-up with your physician for appropriate ongoing care. ? ?

## 2015-12-24 NOTE — ED Provider Notes (Signed)
[facility] DEPT Provider Note   CSN: [id number] Arrival date & time: 12/24/15  1459     History   Chief Complaint Chief Complaint  Patient presents with  . Fall 10/20 back pain    HPI [name] is a 75 y.o. female.  HPI  Patient presents with concern of ongoing low back, and bilateral leg pain. Patient had a mechanical fall 12 days ago. Initially patient had left hip pain. Subsequently she developed pain in her mid lower back. Over the past 12 day she has had increasingly frequent episodes of pain throughout these areas, including radicular distribution of pain right and left. The pain is severe, sharp, burning, not improved with tramadol. No incontinence, no fever, no chills, no loss of sensation or additional falls.   Past Medical History:  Diagnosis Date  . Allergic rhinitis   . Anxiety    and panic attacks;pt states that she is claustrophobic  . Asthma   . Atrial fibrillation ([place])    takes Coumadin daily-instructed to stop  . Bronchitis    in Jan 2013-uses Albuterol prn  . Bronchitis, acute 03-01-11   FINSHED Z-PAK-STILL ON PREDNISONE NOW  . Chronic back pain    hx buldging disc  . Colon cancer ([place]) 2007   s/p surgery and chemo  . Constipation    takes Colace prn  . Diabetes mellitus    glimepiride  . Dizziness    occasionally  . DJD (degenerative joint disease)   . Family history of anesthesia complication    daughter gets PONV  . Family hx of colon cancer   . Fibromyalgia    but doesn't take any meds  . Gastric ulcer   . GERD (gastroesophageal reflux disease)    uses Tums prn  . Gout    takes Colchicine prn  . Hemorrhoids   . History of blood clots    right lung in early 90's  . History of colon polyps   . History of migraine headaches    last migraine 54yrs ago;Pt states she does get sinus headaches  . History of PSVT (paroxysmal supraventricular tachycardia)   . History of shingles   . HLD (hyperlipidemia)    pt doesn't take any  medication for this  . Hypertension    takes Micardis and Diltiazem daily  . Nocturia   . Numbness    left hand  . Osteoarthritis   . Peptic ulcer disease   . Peripheral edema    takes Furosemide every other day  . Peripheral edema    takes Lasix daily  . Rash    on neck and started after she started taking her beta blocker 35yrs ago;medical MD has seen this  . Shortness of breath    pt states that she can be sitting as well as exertion and get short of breath  . Sleep apnea    doesn't use CPAP  . Spinal headache   . Urinary frequency   . Urinary frequency   . Yeast infection of the vagina    being treated for this now by dr.fontaine    Patient Active Problem List   Diagnosis Date Noted  . Chronic back pain 05/23/2015  . Leucocytosis 05/23/2015  . Atrial fibrillation with RVR ([place]) 05/22/2015  . Degeneration of intervertebral disc of lumbosacral region 04/11/2015  . Fibromyalgia 04/11/2015  . Hyperlipidemia 02/10/2011  . SLEEP APNEA, OBSTRUCTIVE 04/24/2010  . Diabetes mellitus type 2, uncomplicated ([place]) [id number]  . History of malignant neoplasm of  large intestine 12/25/2008  . Essential hypertension 01/03/2007  . Asthma 01/03/2007    Past Surgical History:  Procedure Laterality Date  . ANKLE SURGERY     tumor-benign removed left ankle  . ANTERIOR CERVICAL DECOMP/DISCECTOMY FUSION  01/12/2011   Procedure: ANTERIOR CERVICAL DECOMPRESSION/DISCECTOMY FUSION 2 LEVELS;  Surgeon: Cooper Render Pool;  Location: [facility];  Service: Neurosurgery;  Laterality: Bilateral;  Cervical five-six, six-seven anterior cervical discectomy and fusion with allograft and plating  . ANTERIOR CERVICAL DECOMP/DISCECTOMY FUSION  06/11/2011   Procedure: ANTERIOR CERVICAL DECOMPRESSION/DISCECTOMY FUSION 1 LEVEL/HARDWARE REMOVAL;  Surgeon: Charlie Pitter, MD;  Location: [facility];  Service: Neurosurgery;  Laterality: N/A;  Cervical four-five anterior cervical decompression fusion with allograft and  plating; Removal of Cervical five to seven plate  . bilateral cataracts removed    . caesarean section  1966/69/72   x 3  . CARDIAC CATHETERIZATION  90's and 2005  . CARPAL TUNNEL RELEASE Left 11/2013  . cataract surgery  2010  . COLECTOMY  2007   colon cancer  . COLONOSCOPY    . DILATION AND CURETTAGE OF UTERUS  02-19-11   & POLYP REMOVAL  . GANGLION CYST EXCISION  > 80yrs ago   removed from left pointer finger  . [place]   x 2  . HYSTEROSCOPY W/D&C  02/19/2011   Procedure: DILATATION AND CURETTAGE /HYSTEROSCOPY;  Surgeon: Anastasio Auerbach, MD;  Location: [facility];  Service: Gynecology;  Laterality: N/A;  requests one hour  . JOINT REPLACEMENT Left    knee  . knee arthrosocpy     bil;couple of years before knee replacement  . KNEE SURGERY     left TKA  . [place] SURGERY  2008  . port a cath placed  2008  . port a cath removed  2008  . pyelonidal cystectomy     at age 67  . SHOULDER ARTHROSCOPY WITH SUBACROMIAL DECOMPRESSION AND OPEN ROTATOR C Right 03/16/2013   Procedure: RIGHT SHOULDER ARTHROSCOPY WITH SUBACROMIAL DECOMPRESSION AND OPEN ROTATOR CUFF REPAIR, OPEN DISTAL CLAVICLE  RESECTION POSSIBLE BICEP TENODESIS ;  Surgeon: Augustin Schooling, MD;  Location: [facility];  Service: Orthopedics;  Laterality: Right;  . TRIGGER FINGER RELEASE Left    thumb, tendonitis, tumor removed  . TUBAL LIGATION  1972    OB History    Gravida Para Term Preterm AB Living   3 3       3    SAB TAB Ectopic Multiple Live Births                   Home Medications    Prior to Admission medications   Medication Sig Start Date End Date Taking? Authorizing Provider  albuterol (PROVENTIL HFA;VENTOLIN HFA) 108 (90 BASE) MCG/ACT inhaler Inhale 2 puffs into the lungs every 6 (six) hours as needed for wheezing or shortness of breath.    Historical Provider, MD  ALPRAZolam Duanne Moron) 0.5 MG tablet Take 0.5 mg by mouth 3 (three) times daily as needed for anxiety or sleep.    Historical  Provider, MD  aspirin 81 MG tablet Take 81 mg by mouth daily.    Historical Provider, MD  atorvastatin (LIPITOR) 10 MG tablet Take 1 tablet (10 mg total) by mouth daily. Patient not taking: Reported on 05/22/2015 04/11/15   Fay Records, MD  diltiazem (CARDIZEM CD) 360 MG 24 hr capsule TAKE 1 CAPSULE (360 MG TOTAL) BY MOUTH DAILY. 12/15/15   Leanor Kail, PA  flecainide (TAMBOCOR) 50 MG tablet Take 1 tablet (50 mg total) by mouth 2 (two) times daily. Patient not taking: Reported on 07/15/2015 05/23/15   Leanor Kail, PA  furosemide (LASIX) 40 MG tablet Take 1 tablet (40 mg total) by mouth daily. 08/25/15   Hoyt Koch, MD  glimepiride (AMARYL) 4 MG tablet TAKE 1 TABLET (4 MG TOTAL) BY MOUTH DAILY WITH BREAKFAST. 11/21/15   Hoyt Koch, MD  glucose blood (ONE TOUCH ULTRA TEST) test strip 1 each by Other route 2 (two) times daily as needed for other. for testing 02/28/15   Hoyt Koch, MD  loperamide (IMODIUM) 2 MG capsule Take 4 mg by mouth as needed for diarrhea or loose stools.    Historical Provider, MD  losartan (COZAAR) 100 MG tablet Take 1 tablet (100 mg total) by mouth daily. 02/28/15   Hoyt Koch, MD  pantoprazole (PROTONIX) 40 MG tablet Take 1 tablet (40 mg total) by mouth daily. Patient not taking: Reported on 05/22/2015 09/30/14   Amy S Esterwood, PA-C  promethazine (PHENERGAN) 25 MG tablet Take 1 tablet (25 mg total) by mouth every 6 (six) hours as needed for nausea or vomiting. 08/13/15   Hoyt Koch, MD  rivaroxaban (XARELTO) 20 MG TABS tablet Take 1 tablet (20 mg total) by mouth daily with supper. Patient not taking: Reported on 07/15/2015 05/23/15   Leanor Kail, PA  traMADol (ULTRAM) 50 MG tablet TAKE 1 TABLETBY MOUTH EVERY 8 HOURS AS NEEDED 08/13/15   Hoyt Koch, MD    Family History Family History  Problem Relation Age of Onset  . Heart attack Mother   . Stroke Mother   . Hypertension Mother   . Heart disease Father     . Hypertension Sister   . Kidney cancer Sister     left  . Bladder Cancer Brother   . Liver cancer Brother     spread to colon  . Coronary artery disease Other   . Anesthesia problems Neg Hx   . Hypotension Neg Hx   . Malignant hyperthermia Neg Hx   . Pseudochol deficiency Neg Hx     Social History Social History  Substance Use Topics  . Smoking status: Former Smoker    Packs/day: 1.50    Years: 30.00    Types: Cigarettes    Quit date: 02/23/1991  . Smokeless tobacco: Never Used     Comment: quit smoking 27yrs ago  . Alcohol use No     Allergies   Benazepril; Lopressor [metoprolol]; Adhesive [tape]; Morphine; Nifedipine; and Piroxicam   Review of Systems Review of Systems  Constitutional:       Per HPI, otherwise negative  HENT:       Per HPI, otherwise negative  Respiratory:       Per HPI, otherwise negative  Cardiovascular:       Per HPI, otherwise negative  Gastrointestinal: Negative for vomiting.  Endocrine:       Negative aside from HPI  Genitourinary:       Neg aside from HPI   Musculoskeletal:       Per HPI, otherwise negative  Skin: Negative.   Neurological: Negative for syncope.     Physical Exam Updated Vital Signs BP 165/89 (BP Location: Left Arm)   Pulse 74   Temp 98.3 F (36.8 C) (Oral)   Resp 16   Ht 5\' 3"  (1.6 m)   Wt 239 lb (108.4 kg)   LMP 12/30/2000   SpO2  98%   BMI 42.34 kg/m   Physical Exam  Constitutional: She is oriented to person, place, and time. She appears well-developed and well-nourished. No distress.  Obese elderly female sitting upright in bed speaking clearly  HENT:  Head: Normocephalic and atraumatic.  Eyes: Conjunctivae and EOM are normal.  Cardiovascular: Normal rate and regular rhythm.   Pulmonary/Chest: Effort normal and breath sounds normal. No stridor. No respiratory distress.  Abdominal: She exhibits no distension.  Musculoskeletal: She exhibits no edema.       Arms: Neurological: She is alert and  oriented to person, place, and time. No cranial nerve deficit.  Strength in both lower extremities is 5/5 proximal and distal  Skin: Skin is warm and dry.  Psychiatric: She has a normal mood and affect.  Nursing note and vitals reviewed.    ED Treatments / Results    Radiology Ct Lumbar Spine Wo Contrast  Result Date: 12/24/2015 CLINICAL DATA:  Status post fall 2 weeks ago with worsening low back pain radiating into both lower extremities EXAM: CT LUMBAR SPINE WITHOUT CONTRAST TECHNIQUE: Multidetector CT imaging of the lumbar spine was performed without intravenous contrast administration. Multiplanar CT image reconstructions were also generated. COMPARISON:  None. FINDINGS: Segmentation: Normal Alignment: Grade 1 anterolisthesis at L3-L4 Vertebrae: Multilevel anterior osteophytosis, greatest at T11-T12. Paraspinal and other soft tissues: Incompletely visualized large right renal cyst. Aortic atherosclerosis. Disc levels: T11-T12: Endplate spurring with mild bilateral neural foraminal narrowing. No spinal canal stenosis. T12-L1: Disc vacuum phenomenon. No spinal canal or neural foraminal stenosis. L1-L2:  No spinal canal or neural foraminal stenosis. L2-L3: Small disc bulge. No spinal canal or neural foraminal stenosis. L3-L4: Moderate facet hypertrophy and findings of posterior decompression. Mild bilateral neural foraminal narrowing. Nodes spinal canal stenosis. L4-L5: Severe facet hypertrophy. Findings of posterior decompression. No spinal canal stenosis. Mild bilateral neural foraminal narrowing. L5-S1: Severe facet hypertrophy. No spinal canal stenosis. No neural foraminal stenosis. IMPRESSION: 1. No acute fracture or static subluxation of the lumbar spine. 2. Multilevel mild neural foraminal stenosis. No spinal canal stenosis. Findings of posterior decompression at L3-L4 and L4-L5. 3. Multilevel lower lumbar severe facet arthrosis. Electronically Signed   By: Ulyses Jarred M.D.   On: 12/24/2015  18:02   Dg Hip Unilat With Pelvis 2-3 Views Left  Result Date: 12/24/2015 CLINICAL DATA:  Fall 12/12/2015.  Bilateral leg pain EXAM: DG HIP (WITH OR WITHOUT PELVIS) 2-3V LEFT COMPARISON:  None. FINDINGS: There is no evidence of hip fracture or dislocation. There is no evidence of arthropathy or other focal bone abnormality. IMPRESSION: Negative. Electronically Signed   By: Franchot Gallo M.D.   On: 12/24/2015 16:54    Procedures Procedures (including critical care time)  Medications Ordered in ED Medications  fentaNYL (SUBLIMAZE) injection 25 mcg (not administered)     Initial Impression / Assessment and Plan / ED Course  I have reviewed the triage vital signs and the nursing notes.  Pertinent labs & imaging results that were available during my care of the patient were reviewed by me and considered in my medical decision making (see chart for details).  Clinical Course    Update: On repeat exam patient is awake and alert, in no distress. We discussed all findings including a reticulocyte results. Patient has orthopedic follow-up scheduled in 4 days. She was encouraged to keep this appointment, and to discuss physical therapy with her  Final Clinical Impressions(s) / ED Diagnoses  Allergies female presents after mechanical fall that happened about 10 days  ago, now with worsening pain in her left hip, and low back. Given her size, description of worsening pain, some consideration of fracture, and/or compression of nerve roots. Patient's attenuation here didn't demonstrate some spinal pathology, but no acute fracture. Patient had some reduction in pain with narcotics here, was discharged in stable condition with ongoing analgesia to follow-up with orthopedics as scheduled next week.    Carmin Muskrat, MD 12/24/15 [phone]

## 2015-12-24 NOTE — ED Triage Notes (Signed)
Patient complains of right hip pain with radiation down right leg since fall on 10/20. States that the pain is worse with ambulation and change in position. NAD

## 2015-12-29 DIAGNOSIS — M5116 Intervertebral disc disorders with radiculopathy, lumbar region: Secondary | ICD-10-CM | POA: Diagnosis not present

## 2015-12-29 DIAGNOSIS — Z01812 Encounter for preprocedural laboratory examination: Secondary | ICD-10-CM | POA: Diagnosis not present

## 2015-12-29 DIAGNOSIS — M533 Sacrococcygeal disorders, not elsewhere classified: Secondary | ICD-10-CM | POA: Diagnosis not present

## 2015-12-29 DIAGNOSIS — M5441 Lumbago with sciatica, right side: Secondary | ICD-10-CM | POA: Diagnosis not present

## 2015-12-30 DIAGNOSIS — Z01812 Encounter for preprocedural laboratory examination: Secondary | ICD-10-CM | POA: Diagnosis not present

## 2015-12-31 DIAGNOSIS — M5441 Lumbago with sciatica, right side: Secondary | ICD-10-CM | POA: Diagnosis not present

## 2016-01-02 ENCOUNTER — Telehealth: Payer: Self-pay | Admitting: Emergency Medicine

## 2016-01-02 ENCOUNTER — Encounter: Payer: Self-pay | Admitting: Nurse Practitioner

## 2016-01-02 ENCOUNTER — Ambulatory Visit (INDEPENDENT_AMBULATORY_CARE_PROVIDER_SITE_OTHER): Payer: Medicare Other | Admitting: Nurse Practitioner

## 2016-01-02 VITALS — BP 142/82 | HR 79 | Temp 98.1°F | Ht 63.0 in | Wt 247.0 lb

## 2016-01-02 DIAGNOSIS — M5441 Lumbago with sciatica, right side: Secondary | ICD-10-CM | POA: Diagnosis not present

## 2016-01-02 DIAGNOSIS — M5137 Other intervertebral disc degeneration, lumbosacral region: Secondary | ICD-10-CM

## 2016-01-02 DIAGNOSIS — G8929 Other chronic pain: Secondary | ICD-10-CM | POA: Diagnosis not present

## 2016-01-02 MED ORDER — GABAPENTIN 300 MG PO CAPS: 300.0000 mg | ORAL_CAPSULE | Freq: Three times a day (TID) | ORAL | 1 refills | Status: DC

## 2016-01-02 MED ORDER — CYCLOBENZAPRINE HCL 5 MG PO TABS: 5.0000 mg | ORAL_TABLET | Freq: Three times a day (TID) | ORAL | 1 refills | Status: DC | PRN

## 2016-01-02 MED ORDER — METHYLPREDNISOLONE ACETATE 80 MG/ML IJ SUSP
80.0000 mg | Freq: Once | INTRAMUSCULAR | Status: AC
  Administered 2016-01-02: 80 mg via INTRAMUSCULAR

## 2016-01-02 NOTE — Progress Notes (Signed)
Subjective:  Patient ID: [name], female    DOB: 1940/03/06  Age: 75 y.o. MRN: [id number]  CC: Back Pain (Pt stated  fell and injured  10-28 Lt side hip/back pain )   Back Pain  This is a chronic problem. The current episode started more than 1 year ago. The problem occurs constantly. The problem has been gradually worsening since onset. The pain is present in the lumbar spine and sacro-iliac. The quality of the pain is described as aching and shooting. The pain radiates to the right thigh. The pain is severe. The pain is the same all the time. The symptoms are aggravated by lying down. Associated symptoms include leg pain and tingling. Pertinent negatives include no abdominal pain, bladder incontinence, bowel incontinence, chest pain, dysuria, fever, headaches, numbness, pelvic pain, perianal numbness, weakness or weight loss. Risk factors include recent trauma, obesity and menopause. She has tried analgesics (fentanyl IV at hospital, tramadol and vicodin) for the symptoms. The treatment provided no relief.  exacerbation of pain after fall 12/20/15. Accidentally Slipped on water in her kitchen. Impact on buttocks. She was evaluated at hospital 12/24/15: no acute finding on CT lumbar spine (DDD with spina stenosis) and hip x-ray.  She states she was also evaluated by Dr. Rolena Infante with [facility] 01/02/16: MRI lumbar spine with contrast showed no acute finding per patient.  Outpatient Medications Prior to Visit  Medication Sig Dispense Refill  . albuterol (PROVENTIL HFA;VENTOLIN HFA) 108 (90 BASE) MCG/ACT inhaler Inhale 2 puffs into the lungs every 6 (six) hours as needed for wheezing or shortness of breath.    . ALPRAZolam (XANAX) 0.5 MG tablet Take 0.5 mg by mouth 3 (three) times daily as needed for anxiety or sleep.    [name] aspirin 81 MG tablet Take 81 mg by mouth daily.    [name] atorvastatin (LIPITOR) 10 MG tablet Take 1 tablet (10 mg total) by mouth daily. 90 tablet 3  . diltiazem  (CARDIZEM CD) 360 MG 24 hr capsule TAKE 1 CAPSULE (360 MG TOTAL) BY MOUTH DAILY. 30 capsule 3  . flecainide (TAMBOCOR) 50 MG tablet Take 1 tablet (50 mg total) by mouth 2 (two) times daily. 60 tablet 6  . furosemide (LASIX) 40 MG tablet Take 1 tablet (40 mg total) by mouth daily. 30 tablet 3  . glimepiride (AMARYL) 4 MG tablet TAKE 1 TABLET (4 MG TOTAL) BY MOUTH DAILY WITH BREAKFAST. 90 tablet 3  . glucose blood (ONE TOUCH ULTRA TEST) test strip 1 each by Other route 2 (two) times daily as needed for other. for testing 100 each 3  . HYDROcodone-acetaminophen (NORCO/VICODIN) 5-325 MG tablet Take 1 tablet by mouth every 6 (six) hours as needed for severe pain. 15 tablet 0  . loperamide (IMODIUM) 2 MG capsule Take 4 mg by mouth as needed for diarrhea or loose stools.    [name] losartan (COZAAR) 100 MG tablet Take 1 tablet (100 mg total) by mouth daily. 90 tablet 3  . pantoprazole (PROTONIX) 40 MG tablet Take 1 tablet (40 mg total) by mouth daily. 30 tablet 3  . promethazine (PHENERGAN) 25 MG tablet Take 1 tablet (25 mg total) by mouth every 6 (six) hours as needed for nausea or vomiting. 30 tablet 0  . rivaroxaban (XARELTO) 20 MG TABS tablet Take 1 tablet (20 mg total) by mouth daily with supper. 30 tablet 6  . traMADol (ULTRAM) 50 MG tablet TAKE 1 TABLETBY MOUTH EVERY 8 HOURS AS NEEDED 30 tablet 2  No facility-administered medications prior to visit.     ROS See HPI  Objective:  BP (!) 142/82 (BP Location: Right Arm, Patient Position: Sitting, Cuff Size: Large)   Pulse 79   Temp 98.1 F (36.7 C)   Ht 5\' 3"  (1.6 m)   Wt 247 lb (112 kg)   LMP 12/30/2000   SpO2 98%   BMI 43.75 kg/m   BP Readings from Last 3 Encounters:  01/02/16 (!) 142/82  12/24/15 148/70  07/15/15 132/78    Wt Readings from Last 3 Encounters:  01/02/16 247 lb (112 kg)  12/24/15 239 lb (108.4 kg)  05/23/15 236 lb 14.4 oz (107.5 kg)    Physical Exam  Constitutional: She is oriented to person, place, and time.    Cardiovascular: Normal rate, regular rhythm and normal heart sounds.   Pulmonary/Chest: Effort normal.  Musculoskeletal: She exhibits tenderness. She exhibits no edema.       Right hip: Normal.       Left hip: Normal.       Right knee: Normal.       Left knee: Normal.       Right ankle: Normal.       Left ankle: Normal.       Lumbar back: She exhibits tenderness and pain. She exhibits no bony tenderness, no swelling, no edema, no spasm and normal pulse.       Back:       Right upper leg: Normal.       Left upper leg: Normal.       Right lower leg: Normal.       Left lower leg: Normal.       Right foot: Normal.       Left foot: Normal.  Neurological: She is alert and oriented to person, place, and time. No cranial nerve deficit.  Skin: Skin is warm and dry. No rash noted. No erythema.  Vitals reviewed.   Lab Results  Component Value Date   WBC 12.1 (H) 07/15/2015   HGB 15.2 (H) 07/15/2015   HCT 44.3 07/15/2015   PLT 276 07/15/2015   GLUCOSE 194 (H) 07/15/2015   CHOL 196 02/28/2015   TRIG 220.0 (H) 02/28/2015   HDL 52.90 02/28/2015   LDLDIRECT 119.0 02/28/2015   LDLCALC (H) 06/05/2009    132        Total Cholesterol/HDL:CHD Risk Coronary Heart Disease Risk Table                     Men   Women  1/2 Average Risk   3.4   3.3  Average Risk       5.0   4.4  2 X Average Risk   9.6   7.1  3 X Average Risk  23.4   11.0        Use the calculated Patient Ratio above and the CHD Risk Table to determine the patient's CHD Risk.        ATP III CLASSIFICATION (LDL):  <100     mg/dL   Optimal  100-129  mg/dL   Near or Above                    Optimal  130-159  mg/dL   Borderline  160-189  mg/dL   High  >190     mg/dL   Very High   ALT 27 02/28/2015   AST 24 02/28/2015   NA 137 07/15/2015   K 3.8 07/15/2015  CL 103 07/15/2015   CREATININE 1.01 (H) 07/15/2015   BUN 10 07/15/2015   CO2 22 07/15/2015   TSH 1.339 05/23/2015   INR 1.06 05/22/2015   HGBA1C 7.8 (H)  05/22/2015   MICROALBUR 4.9 (H) 05/22/2014    Ct Lumbar Spine Wo Contrast  Result Date: 12/24/2015 CLINICAL DATA:  Status post fall 2 weeks ago with worsening low back pain radiating into both lower extremities EXAM: CT LUMBAR SPINE WITHOUT CONTRAST TECHNIQUE: Multidetector CT imaging of the lumbar spine was performed without intravenous contrast administration. Multiplanar CT image reconstructions were also generated. COMPARISON:  None. FINDINGS: Segmentation: Normal Alignment: Grade 1 anterolisthesis at L3-L4 Vertebrae: Multilevel anterior osteophytosis, greatest at T11-T12. Paraspinal and other soft tissues: Incompletely visualized large right renal cyst. Aortic atherosclerosis. Disc levels: T11-T12: Endplate spurring with mild bilateral neural foraminal narrowing. No spinal canal stenosis. T12-L1: Disc vacuum phenomenon. No spinal canal or neural foraminal stenosis. L1-L2:  No spinal canal or neural foraminal stenosis. L2-L3: Small disc bulge. No spinal canal or neural foraminal stenosis. L3-L4: Moderate facet hypertrophy and findings of posterior decompression. Mild bilateral neural foraminal narrowing. Nodes spinal canal stenosis. L4-L5: Severe facet hypertrophy. Findings of posterior decompression. No spinal canal stenosis. Mild bilateral neural foraminal narrowing. L5-S1: Severe facet hypertrophy. No spinal canal stenosis. No neural foraminal stenosis. IMPRESSION: 1. No acute fracture or static subluxation of the lumbar spine. 2. Multilevel mild neural foraminal stenosis. No spinal canal stenosis. Findings of posterior decompression at L3-L4 and L4-L5. 3. Multilevel lower lumbar severe facet arthrosis. Electronically Signed   By: Ulyses Jarred M.D.   On: 12/24/2015 18:02   Dg Hip Unilat With Pelvis 2-3 Views Left  Result Date: 12/24/2015 CLINICAL DATA:  Fall 12/12/2015.  Bilateral leg pain EXAM: DG HIP (WITH OR WITHOUT PELVIS) 2-3V LEFT COMPARISON:  None. FINDINGS: There is no evidence of hip  fracture or dislocation. There is no evidence of arthropathy or other focal bone abnormality. IMPRESSION: Negative. Electronically Signed   By: Franchot Gallo M.D.   On: 12/24/2015 16:54    Assessment & Plan:   Sianni was seen today for back pain.  Diagnoses and all orders for this visit:  Chronic bilateral low back pain with right-sided sciatica -     methylPREDNISolone acetate (DEPO-MEDROL) injection 80 mg; Inject 1 mL (80 mg total) into the muscle once. -     gabapentin (NEURONTIN) 300 MG capsule; Take 1 capsule (300 mg total) by mouth 3 (three) times daily. -     cyclobenzaprine (FLEXERIL) 5 MG tablet; Take 1 tablet (5 mg total) by mouth 3 (three) times daily as needed for muscle spasms.  Degeneration of intervertebral disc of lumbosacral region -     cyclobenzaprine (FLEXERIL) 5 MG tablet; Take 1 tablet (5 mg total) by mouth 3 (three) times daily as needed for muscle spasms.   I am having Ms. Hockenberry start on gabapentin and cyclobenzaprine. I am also having her maintain her albuterol, pantoprazole, losartan, glucose blood, ALPRAZolam, atorvastatin, loperamide, rivaroxaban, flecainide, aspirin, traMADol, promethazine, furosemide, glimepiride, diltiazem, and HYDROcodone-acetaminophen. We administered methylPREDNISolone acetate.  Meds ordered this encounter  Medications  . methylPREDNISolone acetate (DEPO-MEDROL) injection 80 mg  . gabapentin (NEURONTIN) 300 MG capsule    Sig: Take 1 capsule (300 mg total) by mouth 3 (three) times daily.    Dispense:  90 capsule    Refill:  1    Order Specific Question:   Supervising Provider    Answer:   Cassandria Anger [[id number]]  .  cyclobenzaprine (FLEXERIL) 5 MG tablet    Sig: Take 1 tablet (5 mg total) by mouth 3 (three) times daily as needed for muscle spasms.    Dispense:  14 tablet    Refill:  1    Order Specific Question:   Supervising Provider    Answer:   Cassandria Anger [[id number]]    Follow-up: Return in about 1 month (around  02/01/2016) for back pain.  Wilfred Lacy, NP

## 2016-01-02 NOTE — Patient Instructions (Addendum)
Gabapentin dosage: star with 1cap once a day at bedtime x 3days, then 1cap twice a day(morning and evening) x 3days, then 1cap three times a day continuously.  Follow up with Dr. Sharlet Salina in 68month.

## 2016-01-02 NOTE — Telephone Encounter (Signed)
Pt called and is having nerve pain in her back. She called Dr Rolena Infante with [facility]. He told her if she wanted stronger meds to call her PCP. Does she need to make an appt to be seen or will Dr Sharlet Salina write her a stronger prescription. Please advise thanks.

## 2016-01-02 NOTE — Progress Notes (Signed)
Pre visit review using our clinic review tool, if applicable. No additional management support is needed unless otherwise documented below in the visit note. 

## 2016-01-02 NOTE — Telephone Encounter (Signed)
I'm not sure what stronger medicine means but if she is having a new problem yes she would need a visit.

## 2016-01-02 NOTE — Telephone Encounter (Signed)
appt made today at 430 with C. Nche. Thanks.

## 2016-01-02 NOTE — Assessment & Plan Note (Signed)
No acute finding in CT lumbar spine. No improvement with vicodin and tramadol. Allergic reaction to NSAIDs. Flexeril and gabapentin prescribed at this time F/up in 57month

## 2016-01-06 ENCOUNTER — Ambulatory Visit (INDEPENDENT_AMBULATORY_CARE_PROVIDER_SITE_OTHER): Payer: Medicare Other | Admitting: Orthopaedic Surgery

## 2016-01-06 ENCOUNTER — Encounter (INDEPENDENT_AMBULATORY_CARE_PROVIDER_SITE_OTHER): Payer: Self-pay | Admitting: Orthopaedic Surgery

## 2016-01-06 DIAGNOSIS — M5416 Radiculopathy, lumbar region: Secondary | ICD-10-CM | POA: Diagnosis not present

## 2016-01-06 MED ORDER — PREDNISONE 10 MG (21) PO TBPK: 10.0000 mg | ORAL_TABLET | Freq: Every day | ORAL | 0 refills | Status: DC

## 2016-01-06 MED ORDER — OXYCODONE-ACETAMINOPHEN 5-325 MG PO TABS: 1.0000 | ORAL_TABLET | ORAL | 0 refills | Status: DC | PRN

## 2016-01-06 NOTE — Progress Notes (Signed)
Office Visit Note   Patient: [name]           Date of Birth: 1940-05-22           MRN: [id number] Visit Date: 01/06/2016              Requested by: Julie Koch, MD [place], [place] [id number] PCP: Julie Koch, MD   Assessment & Plan: Visit Diagnoses:  1. Lumbar radiculopathy     Plan: Percocet and Sterapred prescribed today hopefully this will give her some good pain relief. In the meantime I've asked her to obtain the MRI report and images from [facility] for review.  Follow-Up Instructions: Return in about 2 weeks (around 01/20/2016) for recheck LBP.   Orders:  No orders of the defined types were placed in this encounter.  Meds ordered this encounter  Medications  . predniSONE (STERAPRED UNI-PAK 21 TAB) 10 MG (21) TBPK tablet    Sig: Take 1 tablet (10 mg total) by mouth daily. Take as directed    Dispense:  21 tablet    Refill:  0  . oxyCODONE-acetaminophen (PERCOCET) 5-325 MG tablet    Sig: Take 1 tablet by mouth every 4 (four) hours as needed for severe pain.    Dispense:  30 tablet    Refill:  0      Procedures: No procedures performed   Clinical Data: No additional findings.   Subjective: Chief Complaint  Patient presents with  . Lower Back - Pain    HPI [name] is a 75 year old female who has had severe low back pain that radiates to the right leg since October 20 when she had a mechanical fall and injured her back. She's had surgery back in 2008 by [name] in which she did well from. She went to the hospital 2 weeks ago for continued low back pain x-rays and CT scans were negative for acute injury and shows severe lumbar spondylosis.. She also had an MRI done at [facility] and per the patient she spoke with [name] who stated that she did not have any interval changes on her MRI and did not need any surgery. She is here today because she is frustrated that she cannot get any  help from [facility].  Denies any fevers chills nausea vomiting or incontinence. She denies any constitutional symptoms. Denies any focal sensory deficits. Pain is limiting her strength. Review of Systems Negative except for history of present illness  Objective: Vital Signs: LMP 12/30/2000   Physical Exam  Constitutional: She is oriented to person, place, and time. She appears well-developed and well-nourished.  HENT:  Head: Atraumatic.  Eyes: EOM are normal.  Neck: Neck supple.  Cardiovascular: Intact distal pulses.   Pulmonary/Chest: Effort normal.  Abdominal: Soft.  Neurological: She is alert and oriented to person, place, and time.  Skin: Skin is warm. Capillary refill takes less than 2 seconds.  Psychiatric: She has a normal mood and affect. Her behavior is normal. Judgment and thought content normal.  Nursing note and vitals reviewed.   Ortho Exam Exam of her lumbar spine is positive for right straight leg raise test. She is tender throughout her lumbar spine. She has pain with movement of her hips that localizes to the lower back. She has no focal motor or sensory deficits. Reflexes are symmetric. No upper motor neuron signs. Specialty Comments:  No specialty comments available.  Imaging: No results found.   [place]  History: Patient Active Problem List   Diagnosis Date Noted  . Chronic back pain 05/23/2015  . Leucocytosis 05/23/2015  . Atrial fibrillation with RVR ([place]) 05/22/2015  . Degeneration of intervertebral disc of lumbosacral region 04/11/2015  . Fibromyalgia 04/11/2015  . Hyperlipidemia 02/10/2011  . SLEEP APNEA, OBSTRUCTIVE 04/24/2010  . Diabetes mellitus type 2, uncomplicated ([place]) [id number]  . History of malignant neoplasm of large intestine 12/25/2008  . Essential hypertension 01/03/2007  . Asthma 01/03/2007   Past Medical History:  Diagnosis Date  . Allergic rhinitis   . Anxiety    and panic attacks;pt states that she is  claustrophobic  . Asthma   . Atrial fibrillation ([place])    takes Coumadin daily-instructed to stop  . Bronchitis    in Jan 2013-uses Albuterol prn  . Bronchitis, acute 03-01-11   FINSHED Z-PAK-STILL ON PREDNISONE NOW  . Chronic back pain    hx buldging disc  . Colon cancer ([place]) 2007   s/p surgery and chemo  . Constipation    takes Colace prn  . Diabetes mellitus    glimepiride  . Dizziness    occasionally  . DJD (degenerative joint disease)   . Family history of anesthesia complication    daughter gets PONV  . Family hx of colon cancer   . Fibromyalgia    but doesn't take any meds  . Gastric ulcer   . GERD (gastroesophageal reflux disease)    uses Tums prn  . Gout    takes Colchicine prn  . Hemorrhoids   . History of blood clots    right lung in early 90's  . History of colon polyps   . History of migraine headaches    last migraine 36yrs ago;Pt states she does get sinus headaches  . History of PSVT (paroxysmal supraventricular tachycardia)   . History of shingles   . HLD (hyperlipidemia)    pt doesn't take any medication for this  . Hypertension    takes Micardis and Diltiazem daily  . Nocturia   . Numbness    left hand  . Osteoarthritis   . Peptic ulcer disease   . Peripheral edema    takes Furosemide every other day  . Peripheral edema    takes Lasix daily  . Rash    on neck and started after she started taking her beta blocker 71yrs ago;medical MD has seen this  . Shortness of breath    pt states that she can be sitting as well as exertion and get short of breath  . Sleep apnea    doesn't use CPAP  . Spinal headache   . Urinary frequency   . Urinary frequency   . Yeast infection of the vagina    being treated for this now by [place]    Family History  Problem Relation Age of Onset  . Heart attack Mother   . Stroke Mother   . Hypertension Mother   . Heart disease Father   . Hypertension Sister   . Kidney cancer Sister     left  . Bladder  Cancer Brother   . Liver cancer Brother     spread to colon  . Coronary artery disease Other   . Anesthesia problems Neg Hx   . Hypotension Neg Hx   . Malignant hyperthermia Neg Hx   . Pseudochol deficiency Neg Hx     Past Surgical History:  Procedure Laterality Date  . ANKLE SURGERY     tumor-benign removed left ankle  .  ANTERIOR CERVICAL DECOMP/DISCECTOMY FUSION  01/12/2011   Procedure: ANTERIOR CERVICAL DECOMPRESSION/DISCECTOMY FUSION 2 LEVELS;  Surgeon: Cooper Render Pool;  Location: [facility];  Service: Neurosurgery;  Laterality: Bilateral;  Cervical five-six, six-seven anterior cervical discectomy and fusion with allograft and plating  . ANTERIOR CERVICAL DECOMP/DISCECTOMY FUSION  06/11/2011   Procedure: ANTERIOR CERVICAL DECOMPRESSION/DISCECTOMY FUSION 1 LEVEL/HARDWARE REMOVAL;  Surgeon: Charlie Pitter, MD;  Location: [facility];  Service: Neurosurgery;  Laterality: N/A;  Cervical four-five anterior cervical decompression fusion with allograft and plating; Removal of Cervical five to seven plate  . bilateral cataracts removed    . caesarean section  1966/69/72   x 3  . CARDIAC CATHETERIZATION  90's and 2005  . CARPAL TUNNEL RELEASE Left 11/2013  . cataract surgery  2010  . COLECTOMY  2007   colon cancer  . COLONOSCOPY    . DILATION AND CURETTAGE OF UTERUS  02-19-11   & POLYP REMOVAL  . GANGLION CYST EXCISION  > 70yrs ago   removed from left pointer finger  . [place]   x 2  . HYSTEROSCOPY W/D&C  02/19/2011   Procedure: DILATATION AND CURETTAGE /HYSTEROSCOPY;  Surgeon: Anastasio Auerbach, MD;  Location: [facility];  Service: Gynecology;  Laterality: N/A;  requests one hour  . JOINT REPLACEMENT Left    knee  . knee arthrosocpy     bil;couple of years before knee replacement  . KNEE SURGERY     left TKA  . [place] SURGERY  2008  . port a cath placed  2008  . port a cath removed  2008  . pyelonidal cystectomy     at age 74  . SHOULDER ARTHROSCOPY WITH  SUBACROMIAL DECOMPRESSION AND OPEN ROTATOR C Right 03/16/2013   Procedure: RIGHT SHOULDER ARTHROSCOPY WITH SUBACROMIAL DECOMPRESSION AND OPEN ROTATOR CUFF REPAIR, OPEN DISTAL CLAVICLE  RESECTION POSSIBLE BICEP TENODESIS ;  Surgeon: Augustin Schooling, MD;  Location: [facility];  Service: Orthopedics;  Laterality: Right;  . TRIGGER FINGER RELEASE Left    thumb, tendonitis, tumor removed  . TUBAL LIGATION  1972   Social History   Occupational History  . retired    Social History Main Topics  . Smoking status: Former Smoker    Packs/day: 1.50    Years: 30.00    Types: Cigarettes    Quit date: 02/23/1991  . Smokeless tobacco: Never Used     Comment: quit smoking 9yrs ago  . Alcohol use No  . Drug use: No  . Sexual activity: Yes    Partners: Male    Birth control/ protection: Post-menopausal

## 2016-01-09 DIAGNOSIS — M5441 Lumbago with sciatica, right side: Secondary | ICD-10-CM | POA: Diagnosis not present

## 2016-01-09 DIAGNOSIS — M5116 Intervertebral disc disorders with radiculopathy, lumbar region: Secondary | ICD-10-CM | POA: Diagnosis not present

## 2016-01-12 ENCOUNTER — Encounter: Payer: Self-pay | Admitting: Internal Medicine

## 2016-01-12 ENCOUNTER — Ambulatory Visit (INDEPENDENT_AMBULATORY_CARE_PROVIDER_SITE_OTHER): Payer: Medicare Other | Admitting: Internal Medicine

## 2016-01-12 VITALS — BP 170/90 | HR 89 | Temp 98.0°F | Resp 20 | Ht 63.0 in | Wt 243.1 lb

## 2016-01-12 DIAGNOSIS — N281 Cyst of kidney, acquired: Secondary | ICD-10-CM | POA: Diagnosis not present

## 2016-01-12 NOTE — Patient Instructions (Signed)
We are checking the ultrasound of the kidney to check on the size and appearance of the cyst.

## 2016-01-12 NOTE — Progress Notes (Signed)
Pre visit review using our clinic review tool, if applicable. No additional management support is needed unless otherwise documented below in the visit note. 

## 2016-01-12 NOTE — Progress Notes (Signed)
   Subjective:    Patient ID: [name], female    DOB: [birth year], 75 y.o.   MRN: [id number]  HPI The patient is a 75 Yo female coming in for mass on the right kidney. She had MRI of the lumbar spine done for worsening pain which did reveal cyst on the right kidney about 6.5 cm and she is worried that this is causing her pain. She did not remember that they saw a cyst in 2015 and feels that she was never told about this. She has an ultrasound done in 2015 afterwards which she also does not remember. Reviewed the MRI report with her and CT images to show her that we cannot see all of the kidney likely on the CT or MRI if they are focused on the spine.   Review of Systems  Constitutional: Negative.   Respiratory: Negative.   Cardiovascular: Negative.   Gastrointestinal: Negative.   Genitourinary:       Some urinary leaking.   Musculoskeletal: Positive for arthralgias, back pain and myalgias.  Neurological: Negative.       Objective:   Physical Exam  Constitutional: She is oriented to person, place, and time. She appears well-developed and well-nourished.  HENT:  Head: Normocephalic and atraumatic.  Eyes: EOM are normal.  Cardiovascular: Normal rate and regular rhythm.   Pulmonary/Chest: Effort normal and breath sounds normal.  Abdominal: Soft. She exhibits no distension. There is no tenderness. There is no rebound.  Musculoskeletal:  Tenderness in the low back and not over the location of the kidneys.  Neurological: She is alert and oriented to person, place, and time.  Skin: Skin is warm and dry.   Vitals:   01/12/16 1038 01/12/16 1110  BP: (!) 166/82 (!) 170/90  Pulse: 89   Resp: 20   Temp: 98 F (36.7 C)   TempSrc: Oral   SpO2: 97%   Weight: 243 lb 1.9 oz (110.3 kg)   Height: 5\' 3"  (1.6 m)       Assessment & Plan:

## 2016-01-12 NOTE — Assessment & Plan Note (Signed)
Noted on CT abdomen first 2007 (from records available for review) around 3-4 cm in size described as simple, then again in 2015 on MRI lumbar as 6.5 cm cyst simply with follow up US renal which did not find it suspicious. Now with 2017 MRI lumbar about 6.5 cm still. Will order US renal today to re-evaluate the characteristics but likely not the cause of her new pain since this has not grown significantly (if MRI is accurate). She is also having some urinary incontinence symptoms but they are not likely to be related to a kidney cyst.

## 2016-01-19 ENCOUNTER — Telehealth: Payer: Self-pay | Admitting: *Deleted

## 2016-01-19 NOTE — Telephone Encounter (Signed)
Left msg on triage requesting refill on her pain med Oxycodone.../lmb 

## 2016-01-20 ENCOUNTER — Ambulatory Visit (INDEPENDENT_AMBULATORY_CARE_PROVIDER_SITE_OTHER): Payer: Medicare Other | Admitting: Orthopaedic Surgery

## 2016-01-20 NOTE — Telephone Encounter (Signed)
Her orthopedic doctor prescribes this and she should call them about refill.

## 2016-01-20 NOTE — Telephone Encounter (Signed)
Notified pt w/MD response.../lmb 

## 2016-01-23 ENCOUNTER — Ambulatory Visit
Admission: RE | Admit: 2016-01-23 | Discharge: 2016-01-23 | Disposition: A | Discharge status: Home / Self Care | Payer: Medicare Other | Source: Ambulatory Visit | Attending: Internal Medicine | Admitting: Internal Medicine

## 2016-01-23 ENCOUNTER — Telehealth (INDEPENDENT_AMBULATORY_CARE_PROVIDER_SITE_OTHER): Payer: Self-pay | Admitting: Orthopaedic Surgery

## 2016-01-23 DIAGNOSIS — N281 Cyst of kidney, acquired: Secondary | ICD-10-CM | POA: Diagnosis not present

## 2016-01-23 NOTE — Telephone Encounter (Signed)
Tried to call patient to advise no answer. Will try again another time

## 2016-01-23 NOTE — Telephone Encounter (Signed)
See message.

## 2016-01-23 NOTE — Telephone Encounter (Signed)
I cannot give her norco.  She needs to see her PCP or we can refer to pain clinic

## 2016-02-02 ENCOUNTER — Encounter: Payer: Self-pay | Admitting: Internal Medicine

## 2016-02-02 ENCOUNTER — Ambulatory Visit (INDEPENDENT_AMBULATORY_CARE_PROVIDER_SITE_OTHER): Payer: Medicare Other | Admitting: Internal Medicine

## 2016-02-02 VITALS — BP 154/82 | HR 76 | Temp 98.5°F | Resp 20 | Ht 63.0 in | Wt 248.0 lb

## 2016-02-02 DIAGNOSIS — M5137 Other intervertebral disc degeneration, lumbosacral region: Secondary | ICD-10-CM | POA: Diagnosis not present

## 2016-02-02 DIAGNOSIS — N281 Cyst of kidney, acquired: Secondary | ICD-10-CM

## 2016-02-02 MED ORDER — OXYCODONE-ACETAMINOPHEN 5-325 MG PO TABS: 1.0000 | ORAL_TABLET | ORAL | 0 refills | Status: DC | PRN

## 2016-02-02 NOTE — Patient Instructions (Signed)
We have given you the oxycodone today.   We will send you to the kidney specialist to talk to them about the cyst.

## 2016-02-02 NOTE — Assessment & Plan Note (Signed)
Reassured her again that there is not much change from 2015 but she is not having this as she states no one told her she had this growth before and she wants it gone. Referral to nephrology to consult about her renal cyst (noted on CT from 2007 and grown since that time but appears simple cyst.

## 2016-02-02 NOTE — Progress Notes (Signed)
Pre visit review using our clinic review tool, if applicable. No additional management support is needed unless otherwise documented below in the visit note. 

## 2016-02-02 NOTE — Progress Notes (Signed)
   Subjective:    Patient ID: [name], female    DOB: [birth year], 75 y.o.   MRN: [id number]  HPI The patient is a 75 YO female coming in for follow up of renal cyst. She is just convinced that this is cancer. She has strong family history of cancer including personal of colon cancer. Fam of bladder and breast among others. She is concerned that she has had a lot of lipomas removed as well. We discussed previously how the renal cyst is not cancerous and no intervention is needed but she wants to talk about getting it out. She is also out of pain medicine for her back and no longer sees her back specialist and needs to see her other back specialist. She is in misery all the time and needs some of her oxycodone to get her through.   Review of Systems  Constitutional: Positive for activity change. Negative for appetite change, chills, fatigue, fever and unexpected weight change.  Respiratory: Negative.   Cardiovascular: Negative.   Gastrointestinal: Negative.   Musculoskeletal: Positive for arthralgias, back pain, gait problem and myalgias. Negative for joint swelling, neck pain and neck stiffness.  Skin: Negative.       Objective:   Physical Exam  Constitutional: She is oriented to person, place, and time. She appears well-developed and well-nourished.  Overweight  HENT:  Head: Normocephalic and atraumatic.  Eyes: EOM are normal.  Neck: Normal range of motion.  Cardiovascular: Normal rate.   Pulmonary/Chest: Effort normal and breath sounds normal. No respiratory distress. She has no wheezes. She has no rales.  Abdominal: Soft. Bowel sounds are normal. She exhibits no distension and no mass. There is no tenderness. There is no rebound and no guarding.  Neurological: She is alert and oriented to person, place, and time.  Skin: Skin is warm and dry.  Psychiatric:  Distraught and crying during the visit.    Vitals:   02/02/16 1001  BP: (!) 160/80  Pulse: 76  Resp: 20  Temp: 98.5  F (36.9 C)  TempSrc: Oral  SpO2: 97%  Weight: 248 lb (112.5 kg)  Height: 5\' 3"  (1.6 m)      Assessment & Plan:

## 2016-02-02 NOTE — Assessment & Plan Note (Signed)
Given rx for oxycodone but reminded her that we do not prescribe this long term for her and this is a 1 time exception. She needs to return to her back specialist and she agrees.

## 2016-03-01 ENCOUNTER — Other Ambulatory Visit: Payer: Self-pay | Admitting: Internal Medicine

## 2016-03-01 NOTE — Telephone Encounter (Signed)
Has gotten other controlled from additional sources. Needs visit to discuss.

## 2016-03-01 NOTE — Telephone Encounter (Signed)
Scheduled apt for tomorrow 03/02/2016

## 2016-03-01 NOTE — Telephone Encounter (Signed)
Called and left message to call back.

## 2016-03-02 ENCOUNTER — Encounter: Payer: Self-pay | Admitting: Internal Medicine

## 2016-03-02 ENCOUNTER — Ambulatory Visit (INDEPENDENT_AMBULATORY_CARE_PROVIDER_SITE_OTHER): Payer: Medicare Other | Admitting: Internal Medicine

## 2016-03-02 DIAGNOSIS — M797 Fibromyalgia: Secondary | ICD-10-CM | POA: Diagnosis not present

## 2016-03-02 DIAGNOSIS — G8929 Other chronic pain: Secondary | ICD-10-CM

## 2016-03-02 DIAGNOSIS — M5441 Lumbago with sciatica, right side: Secondary | ICD-10-CM

## 2016-03-02 MED ORDER — TRAMADOL HCL 50 MG PO TABS: 50.0000 mg | ORAL_TABLET | Freq: Every day | ORAL | 0 refills | Status: DC | PRN

## 2016-03-02 NOTE — Progress Notes (Signed)
Pre visit review using our clinic review tool, if applicable. No additional management support is needed unless otherwise documented below in the visit note. 

## 2016-03-02 NOTE — Assessment & Plan Note (Signed)
Talked to her about the risk of overdose with multiple medications. She knows that we will prescribe tramadol for her only in the understanding that she not fill pain medication from another provider. She knows and understands that even if Dr. Trenton Gammon offers her stronger medication she is to decline as she told us today she does not need that. Rx for tramadol #30 no refills and need to review [place] narcotic database with refill. Visit time extended due to counseling about the risks and harms of concurrent opioid therapy and why it is not safe and why we declined the tramadol prescription.

## 2016-03-02 NOTE — Patient Instructions (Signed)
We have refilled the tramadol today and will send it in for you.   You should not accept pain medication from Dr. Trenton Gammon since we are giving you the tramadol otherwise we will not be able to prescribe this.

## 2016-03-02 NOTE — Assessment & Plan Note (Signed)
Have removed xanax from her list as she has not legally filled this in the last 2 years per  narcotic database.

## 2016-03-02 NOTE — Progress Notes (Signed)
   Subjective:    Patient ID: [name], female    DOB: 07-07-1940, 76 y.o.   MRN: [id number]  HPI The patient is a 76 YO female coming in for concerns about denial of tramadol. Last visit we discussed her pain she was going to go back to her orthopedic doctor who had been prescribing her oxycodone and a one time prescription of that was given by [place]. In the last 3-4 months she has also gotten hydrocodone from an ER provider as well as oxycodone from her orthopedic (who she is no longer seeing) and sees a neurosurgeon later this week. She has not filled the tramadol since August. She initially states that she has only 2 oxycodone and taking daily only at night. When asked why she only has 2 oxycodone and no hydrocodone left when asked about her fill dates she edits that she sometimes took 2-3 on a bad day and then some days did not take any. She then states that she does not need the strong oxycodone and does not like taking this medications but her knee needs replacing and also her back is bad and this helps her to be functional. Also she has stopped taking glimepiride and has changed her diet instead and is working on weight loss. Sugars monitored at home are at goal.   Review of Systems  Constitutional: Positive for activity change. Negative for appetite change, chills, fatigue, fever and unexpected weight change.  HENT: Negative.   Respiratory: Negative.   Cardiovascular: Negative.   Gastrointestinal: Negative.   Musculoskeletal: Positive for arthralgias and back pain. Negative for gait problem, joint swelling, myalgias, neck pain and neck stiffness.  Skin: Negative.   Neurological: Negative.       Objective:   Physical Exam  Constitutional: She is oriented to person, place, and time. She appears well-developed and well-nourished.  Overweight  HENT:  Head: Normocephalic and atraumatic.  Eyes: EOM are normal.  Neck: Normal range of motion.  Cardiovascular: Normal rate and regular  rhythm.   Pulmonary/Chest: Effort normal and breath sounds normal.  Abdominal: Soft.  Neurological: She is alert and oriented to person, place, and time.  Skin: Skin is warm and dry.  Psychiatric:  Some frustration when confronted with her narcotic history and when trying to account for her medication.    Vitals:   03/02/16 1012  BP: (!) 150/80  Pulse: 73  Resp: 14  Temp: 97.9 F (36.6 C)  TempSrc: Oral  SpO2: 98%  Weight: 235 lb (106.6 kg)  Height: 5\' 3"  (1.6 m)      Assessment & Plan:  Visit time 25 minutes: at least 50% visit time spent in counseling and coordination of care face to face with the patient: Counseling about the risk of opioid therapy and especially concurrent opiate prescriptions and why this is not safe for [place] to prescribe and how this is her responsibility to declines medications she should not be taking. Also extended time trying to assess how much and how often she is using her current pain medication as she filled early last month and now is admitting to being almost out of medication at this time.

## 2016-03-03 DIAGNOSIS — I10 Essential (primary) hypertension: Secondary | ICD-10-CM | POA: Diagnosis not present

## 2016-03-03 DIAGNOSIS — M5136 Other intervertebral disc degeneration, lumbar region: Secondary | ICD-10-CM | POA: Diagnosis not present

## 2016-03-03 DIAGNOSIS — Z6841 Body Mass Index (BMI) 40.0 and over, adult: Secondary | ICD-10-CM | POA: Diagnosis not present

## 2016-03-07 ENCOUNTER — Other Ambulatory Visit: Payer: Self-pay | Admitting: Internal Medicine

## 2016-04-12 ENCOUNTER — Other Ambulatory Visit: Payer: Self-pay | Admitting: Physician Assistant

## 2016-04-12 ENCOUNTER — Encounter: Payer: Self-pay | Admitting: Internal Medicine

## 2016-04-16 ENCOUNTER — Encounter: Payer: Self-pay | Admitting: Internal Medicine

## 2016-04-16 DIAGNOSIS — H5203 Hypermetropia, bilateral: Secondary | ICD-10-CM | POA: Diagnosis not present

## 2016-04-16 DIAGNOSIS — H04123 Dry eye syndrome of bilateral lacrimal glands: Secondary | ICD-10-CM | POA: Diagnosis not present

## 2016-04-16 DIAGNOSIS — E119 Type 2 diabetes mellitus without complications: Secondary | ICD-10-CM | POA: Diagnosis not present

## 2016-04-16 LAB — HM DIABETES EYE EXAM

## 2016-04-16 NOTE — Progress Notes (Unsigned)
Results entered and sent to scan  

## 2016-04-27 ENCOUNTER — Other Ambulatory Visit: Payer: Self-pay | Admitting: Internal Medicine

## 2016-05-02 ENCOUNTER — Other Ambulatory Visit: Payer: Self-pay | Admitting: Physician Assistant

## 2016-05-05 ENCOUNTER — Other Ambulatory Visit: Payer: Self-pay | Admitting: *Deleted

## 2016-05-05 MED ORDER — GLUCOSE BLOOD VI STRP: ORAL_STRIP | 3 refills | Status: DC

## 2016-05-07 ENCOUNTER — Other Ambulatory Visit: Payer: Self-pay | Admitting: Internal Medicine

## 2016-05-25 ENCOUNTER — Encounter: Payer: Self-pay | Admitting: Internal Medicine

## 2016-05-25 ENCOUNTER — Other Ambulatory Visit (INDEPENDENT_AMBULATORY_CARE_PROVIDER_SITE_OTHER): Payer: Medicare Other

## 2016-05-25 ENCOUNTER — Ambulatory Visit (INDEPENDENT_AMBULATORY_CARE_PROVIDER_SITE_OTHER): Payer: Medicare Other | Admitting: Internal Medicine

## 2016-05-25 VITALS — BP 160/78 | HR 85 | Temp 98.2°F | Resp 16 | Ht 63.0 in | Wt 227.0 lb

## 2016-05-25 DIAGNOSIS — E119 Type 2 diabetes mellitus without complications: Secondary | ICD-10-CM

## 2016-05-25 LAB — HEMOGLOBIN A1C: Hgb A1c MFr Bld: 7.3 % — ABNORMAL HIGH (ref 4.6–6.5)

## 2016-05-25 MED ORDER — DILTIAZEM HCL ER COATED BEADS 360 MG PO CP24: 360.0000 mg | ORAL_CAPSULE | Freq: Every day | ORAL | 0 refills | Status: DC

## 2016-05-25 NOTE — Patient Instructions (Signed)
Call the cardiologist Dr. Harrington Challenger for a visit for the diltiazem, we have sent in a 30 day supply today.  We are checking the labs today and will call you back about the results.

## 2016-05-25 NOTE — Progress Notes (Signed)
Pre visit review using our clinic review tool, if applicable. No additional management support is needed unless otherwise documented below in the visit note. 

## 2016-05-26 NOTE — Assessment & Plan Note (Addendum)
Checking HgA1c, she stopped taking amaryl about 3 months ago and is working on diet and exercise to lose weight for control. Adjust as needed. She is on statin and ARB. Foot exam done at visit. Reminded about eye exam.

## 2016-05-26 NOTE — Progress Notes (Signed)
   Subjective:    Patient ID: [name], female    DOB: Aug 10, 1940, 76 y.o.   MRN: [id number]  HPI The patient is a 76 YO female coming in for follow up of her diabetes. She has stopped taking amaryl about 3 months ago and wants to see how the sugars are doing. She is checking morning sugars and they are doing well. She has been trying to modify her diet and she is working on losing weight. She denies new numbness in her legs but does have some which is stable. Needs refill on her diltiazem as she is almost out. She didn't realize she needed to schedule with her cardiologist (has not seen in more than 1 year). Still having her chronic back pain.   Review of Systems  Constitutional: Positive for activity change and appetite change. Negative for diaphoresis, fatigue and fever.  Respiratory: Negative.   Cardiovascular: Negative.   Gastrointestinal: Negative.   Musculoskeletal: Positive for arthralgias and back pain. Negative for gait problem and joint swelling.  Skin: Negative.   Neurological: Negative.       Objective:   Physical Exam  Constitutional: She is oriented to person, place, and time. She appears well-developed and well-nourished.  HENT:  Head: Normocephalic and atraumatic.  Eyes: EOM are normal.  Cardiovascular: Normal rate and regular rhythm.   Pulmonary/Chest: Effort normal. No respiratory distress. She has no wheezes. She has no rales.  Abdominal: Soft.  Neurological: She is alert and oriented to person, place, and time.  Skin: Skin is warm and dry.  Foot exam done   Vitals:   05/25/16 1316  BP: (!) 160/78  Pulse: 85  Resp: 16  Temp: 98.2 F (36.8 C)  TempSrc: Oral  SpO2: 98%  Weight: 227 lb (103 kg)  Height: 5\' 3"  (1.6 m)      Assessment & Plan:

## 2016-05-28 ENCOUNTER — Encounter: Payer: Self-pay | Admitting: Physician Assistant

## 2016-05-28 DIAGNOSIS — M25551 Pain in right hip: Secondary | ICD-10-CM | POA: Diagnosis not present

## 2016-05-28 DIAGNOSIS — G8929 Other chronic pain: Secondary | ICD-10-CM | POA: Diagnosis not present

## 2016-05-28 DIAGNOSIS — M25552 Pain in left hip: Secondary | ICD-10-CM | POA: Diagnosis not present

## 2016-05-28 DIAGNOSIS — M25561 Pain in right knee: Secondary | ICD-10-CM | POA: Diagnosis not present

## 2016-05-31 ENCOUNTER — Ambulatory Visit: Payer: Medicare Other | Admitting: Physician Assistant

## 2016-05-31 NOTE — Progress Notes (Deleted)
Cardiology Office Note:    Date:  05/31/2016   ID:  [name], DOB 1941/01/31, MRN [id number]  PCP:  Hoyt Koch, MD  Cardiologist:  ***  Electrophysiologist:  ***  Referring MD: Hoyt Koch, *   No chief complaint on file. ***  History of Present Illness:    [name] is a 76 y.o. female with a hx of ***Paroxysmal atrial fibrillation, normal coronary arteries by cardiac catheterization in 07/2003, HTN, diabetes, HL, peptic ulcer disease, fibromyalgia, asthma, gout, sleep apnea, colon cancer status post resection 2007 and remote pulmonary emboli in the 1990s.  Patient has refused Coumadin in the past due to intolerance.  She was seen in the ED in 1/17 for AF with RVR and spontaneously converted to NSR.  She was last seen in clinic by Dr. Dorris Carnes 2/17.  Patient continued to decline anticoagulation at that time.    Patient was admitted in 3/17 with AF with RVR complicated by hypotension. She was placed on Rivaroxaban for anticoagulation. She converted to sinus rhythm on IV diltiazem. She had recurrent AF with RVR and was seen by EP and placed on flecainide 50 milligrams twice a day. She was to follow-up in the atrial fibrillation clinic but has failed to return for an appointment. She was seen back in the emergency room 5/17 secondary to recurrent AF with RVR. She spontaneously converted to NSR with IV placement. Notes indicate that she was no longer on Rivaroxaban or flecainide. ***  Prior CV studies:   The following studies were reviewed today:  *** Myoview 4/11: E. EF 66%, no ischemia. Last echocardiogram 7/12: Mild LVH, EF 55-60%, mild AI, diastolic dysfunction  Past Medical History:  Diagnosis Date  . Allergic rhinitis   . Anxiety    and panic attacks;pt states that she is claustrophobic  . Asthma   . Atrial fibrillation ([place])    takes Coumadin daily-instructed to stop  . Bronchitis    in Jan 2013-uses Albuterol prn  . Bronchitis, acute 03-01-11   FINSHED Z-PAK-STILL ON PREDNISONE NOW  . Chronic back pain    hx buldging disc  . Colon cancer ([place]) 2007   s/p surgery and chemo  . Constipation    takes Colace prn  . Diabetes mellitus    glimepiride  . Dizziness    occasionally  . DJD (degenerative joint disease)   . Family history of anesthesia complication    daughter gets PONV  . Family hx of colon cancer   . Fibromyalgia    but doesn't take any meds  . Gastric ulcer   . GERD (gastroesophageal reflux disease)    uses Tums prn  . Gout    takes Colchicine prn  . Hemorrhoids   . History of blood clots    right lung in early 90's  . History of colon polyps   . History of migraine headaches    last migraine 29yrs ago;Pt states she does get sinus headaches  . History of PSVT (paroxysmal supraventricular tachycardia)   . History of shingles   . HLD (hyperlipidemia)    pt doesn't take any medication for this  . Hypertension    takes Micardis and Diltiazem daily  . Nocturia   . Numbness    left hand  . Osteoarthritis   . Peptic ulcer disease   . Peripheral edema    takes Furosemide every other day  . Peripheral edema    takes Lasix daily  . Rash  on neck and started after she started taking her beta blocker 91yrs ago;medical MD has seen this  . Shortness of breath    pt states that she can be sitting as well as exertion and get short of breath  . Sleep apnea    doesn't use CPAP  . Spinal headache   . Urinary frequency   . Urinary frequency   . Yeast infection of the vagina    being treated for this now by dr.fontaine    Past Surgical History:  Procedure Laterality Date  . ANKLE SURGERY     tumor-benign removed left ankle  . ANTERIOR CERVICAL DECOMP/DISCECTOMY FUSION  01/12/2011   Procedure: ANTERIOR CERVICAL DECOMPRESSION/DISCECTOMY FUSION 2 LEVELS;  Surgeon: Cooper Render Pool;  Location: [facility];  Service: Neurosurgery;  Laterality: Bilateral;  Cervical five-six, six-seven anterior cervical discectomy and  fusion with allograft and plating  . ANTERIOR CERVICAL DECOMP/DISCECTOMY FUSION  06/11/2011   Procedure: ANTERIOR CERVICAL DECOMPRESSION/DISCECTOMY FUSION 1 LEVEL/HARDWARE REMOVAL;  Surgeon: Charlie Pitter, MD;  Location: [facility];  Service: Neurosurgery;  Laterality: N/A;  Cervical four-five anterior cervical decompression fusion with allograft and plating; Removal of Cervical five to seven plate  . bilateral cataracts removed    . caesarean section  1966/69/72   x 3  . CARDIAC CATHETERIZATION  90's and 2005  . CARPAL TUNNEL RELEASE Left 11/2013  . cataract surgery  2010  . COLECTOMY  2007   colon cancer  . COLONOSCOPY    . DILATION AND CURETTAGE OF UTERUS  02-19-11   & POLYP REMOVAL  . GANGLION CYST EXCISION  > 6yrs ago   removed from left pointer finger  . [place]   x 2  . HYSTEROSCOPY W/D&C  02/19/2011   Procedure: DILATATION AND CURETTAGE /HYSTEROSCOPY;  Surgeon: Anastasio Auerbach, MD;  Location: [facility];  Service: Gynecology;  Laterality: N/A;  requests one hour  . JOINT REPLACEMENT Left    knee  . knee arthrosocpy     bil;couple of years before knee replacement  . KNEE SURGERY     left TKA  . [place] SURGERY  2008  . port a cath placed  2008  . port a cath removed  2008  . pyelonidal cystectomy     at age 34  . SHOULDER ARTHROSCOPY WITH SUBACROMIAL DECOMPRESSION AND OPEN ROTATOR C Right 03/16/2013   Procedure: RIGHT SHOULDER ARTHROSCOPY WITH SUBACROMIAL DECOMPRESSION AND OPEN ROTATOR CUFF REPAIR, OPEN DISTAL CLAVICLE  RESECTION POSSIBLE BICEP TENODESIS ;  Surgeon: Augustin Schooling, MD;  Location: [facility];  Service: Orthopedics;  Laterality: Right;  . TRIGGER FINGER RELEASE Left    thumb, tendonitis, tumor removed  . TUBAL LIGATION  1972    Current Medications: No outpatient prescriptions have been marked as taking for the 05/31/16 encounter (Appointment) with Liliane Shi, PA-C.     Allergies:   Benazepril; Lopressor [metoprolol]; Adhesive [tape];  Morphine; Nifedipine; and Piroxicam   Social History   Social History  . Marital status: Married    Spouse name: N/A  . Number of children: 3  . Years of education: N/A   Occupational History  . retired    Social History Main Topics  . Smoking status: Former Smoker    Packs/day: 1.50    Years: 30.00    Types: Cigarettes    Quit date: 02/23/1991  . Smokeless tobacco: Never Used     Comment: quit smoking 37yrs ago  . Alcohol use  No  . Drug use: No  . Sexual activity: Yes    Partners: Male    Birth control/ protection: Post-menopausal   Other Topics Concern  . Not on file   Social History Narrative   Regular exercise- no.     Family History  Problem Relation Age of Onset  . Heart attack Mother   . Stroke Mother   . Hypertension Mother   . Heart disease Father   . Hypertension Sister   . Kidney cancer Sister     left  . Bladder Cancer Brother   . Liver cancer Brother     spread to colon  . Coronary artery disease Other   . Anesthesia problems Neg Hx   . Hypotension Neg Hx   . Malignant hyperthermia Neg Hx   . Pseudochol deficiency Neg Hx      ROS:   Please see the history of present illness.    ROS All other systems reviewed and are negative.   EKGs/Labs/Other Test Reviewed:    EKG:  EKG is *** ordered today.  The ekg ordered today demonstrates ***  Recent Labs: 07/15/2015: BUN 10; Creatinine, Ser 1.01; Hemoglobin 15.2; Platelets 276; Potassium 3.8; Sodium 137   Recent Lipid Panel    Component Value Date/Time   CHOL 196 02/28/2015 1202   TRIG 220.0 (H) 02/28/2015 1202   HDL 52.90 02/28/2015 1202   CHOLHDL 4 02/28/2015 1202   VLDL 44.0 (H) 02/28/2015 1202   LDLCALC (H) 06/05/2009 0547    132        Total Cholesterol/HDL:CHD Risk Coronary Heart Disease Risk Table                     Men   Women  1/2 Average Risk   3.4   3.3  Average Risk       5.0   4.4  2 X Average Risk   9.6   7.1  3 X Average Risk  23.4   11.0        Use the calculated  Patient Ratio above and the CHD Risk Table to determine the patient's CHD Risk.        ATP III CLASSIFICATION (LDL):  <100     mg/dL   Optimal  100-129  mg/dL   Near or Above                    Optimal  130-159  mg/dL   Borderline  160-189  mg/dL   High  >190     mg/dL   Very High   LDLDIRECT 119.0 02/28/2015 1202     Physical Exam:    VS:  LMP 12/30/2000     Wt Readings from Last 3 Encounters:  05/25/16 227 lb (103 kg)  03/02/16 235 lb (106.6 kg)  02/02/16 248 lb (112.5 kg)     ***Physical Exam  ASSESSMENT:    No diagnosis found. PLAN:    In order of problems listed above:  No diagnosis found.***  Dispo:  No Follow-up on file.   Medication Adjustments/Labs and Tests Ordered: Current medicines are reviewed at length with the patient today.  Concerns regarding medicines are outlined above.  Medication changes, Labs and Tests ordered today are outlined in the Patient Instructions noted below. There are no Patient Instructions on file for this visit. Signed, Richardson Dopp, PA-C  05/31/2016 8:37 AM    [facility] [place], [place], [place]  [address]  Phone: [phone]; Fax: [phone]

## 2016-06-01 NOTE — Progress Notes (Signed)
Cardiology Office Note:    Date:  06/02/2016   ID:  [name], DOB [birth year], MRN [id number]  PCP:  Hoyt Koch, MD  Cardiologist:  Dr. Dorris Carnes   Electrophysiologist:  n/a  Referring MD: Rod Can, MD   Chief Complaint  Patient presents with  . Surgical Clearance  . Atrial Fibrillation    follow up    History of Present Illness:    [name] is a 76 y.o. female who is being seen today for the evaluation of Surgical clearance at the request of Rod Can, MD  She has a hx of Paroxysmal atrial fibrillation, normal coronary arteries by cardiac catheterization in 07/2003, HTN, diabetes, HL, peptic ulcer disease, fibromyalgia, asthma, gout, sleep apnea, colon cancer status post resection 2007 and remote pulmonary emboli in the 1990s.  Patient has refused Coumadin in the past due to intolerance.  She was seen in the ED in 1/17 for AF with RVR and spontaneously converted to NSR.  She was last seen in clinic by Dr. Dorris Carnes 2/17.  The patient continued to decline anticoagulation at that time.    Patient was admitted in 3/17 with AF with RVR complicated by hypotension. She was placed on Rivaroxaban for anticoagulation. She converted to sinus rhythm on IV diltiazem. She had recurrent AF with RVR and was seen by EP and placed on flecainide 50 milligrams twice a day. She was to follow-up in the atrial fibrillation clinic but has failed to return for an appointment. She was seen back in the emergency room 5/17 secondary to recurrent AF with RVR. She spontaneously converted to NSR with IV placement. Notes indicate that she was no longer on Rivaroxaban or flecainide.   CHADS2-VASc=4 (female, age > 46, HTN, DM).   She is here alone. She tells me that all anticoagulants cause her significant fatigue and she cannot function on them. She is not willing to try Pradaxa or apixaban. She's had decreased episodes of PAF on diltiazem 360. Her last episode was about a week ago.  During these, she takes an extra 240 mg with resolution. She has good functional status despite DJD. She is able to rake her yard and do other types of yardwork as well as vacuum her house. She denies chest discomfort or significant dyspnea with these activities. She denies orthopnea, PND or edema. She denies syncope. Blood pressures at home are ~ 120/75. She needs right total knee replacement by Dr. Lyla Glassing. Date is not yet determined.  Prior CV studies:   The following studies were reviewed today:  Echo 05/23/15 Mod focal basal and mild conc LVH, vigorous LVF, EF 65-70, no RWMA, Gr 1 DD, mild AS (probably underestimated due to sub optimal angle - mean 8, peak 17), MAC, mild RAE, mild TR   Myoview 05/14/11 Normal stress nuclear study. LV Ejection Fraction: 70%.  LV Wall Motion:  NL LV Function; NL Wall Motion  Myoview 4/11:  EF 66%, no ischemia.   Echocardiogram 7/12:  Mild LVH, EF 55-60%, mild AI, diastolic dysfunction  Past Medical History:  Diagnosis Date  . Allergic rhinitis   . Anxiety    and panic attacks;pt states that she is claustrophobic  . Asthma   . Atrial fibrillation ([place])    takes Coumadin daily-instructed to stop  . Bronchitis    in Jan 2013-uses Albuterol prn  . Bronchitis, acute 03-01-11   FINSHED Z-PAK-STILL ON PREDNISONE NOW  . Chronic back pain    hx buldging disc  .  Colon cancer ([place]) 2007   s/p surgery and chemo  . Constipation    takes Colace prn  . Diabetes mellitus    glimepiride  . Dizziness    occasionally  . DJD (degenerative joint disease)   . Family history of anesthesia complication    daughter gets PONV  . Family hx of colon cancer   . Fibromyalgia    but doesn't take any meds  . Gastric ulcer   . GERD (gastroesophageal reflux disease)    uses Tums prn  . Gout    takes Colchicine prn  . Hemorrhoids   . History of blood clots    right lung in early 90's  . History of colon polyps   . History of migraine headaches    last migraine  29yr ago;Pt states she does get sinus headaches  . History of PSVT (paroxysmal supraventricular tachycardia)   . History of shingles   . HLD (hyperlipidemia)    pt doesn't take any medication for this  . Hypertension    takes Micardis and Diltiazem daily  . Nocturia   . Numbness    left hand  . Osteoarthritis   . Peptic ulcer disease   . Peripheral edema    takes Furosemide every other day  . Peripheral edema    takes Lasix daily  . Rash    on neck and started after she started taking her beta blocker 354yrago;medical MD has seen this  . Shortness of breath    pt states that she can be sitting as well as exertion and get short of breath  . Sleep apnea    doesn't use CPAP  . Spinal headache   . Urinary frequency   . Urinary frequency   . Yeast infection of the vagina    being treated for this now by dr.fontaine    Past Surgical History:  Procedure Laterality Date  . ANKLE SURGERY     tumor-benign removed left ankle  . ANTERIOR CERVICAL DECOMP/DISCECTOMY FUSION  01/12/2011   Procedure: ANTERIOR CERVICAL DECOMPRESSION/DISCECTOMY FUSION 2 LEVELS;  Surgeon: HeCooper Renderool;  [facility];  Service: Neurosurgery;  Laterality: Bilateral;  Cervical five-six, six-seven anterior cervical discectomy and fusion with allograft and plating  . ANTERIOR CERVICAL DECOMP/DISCECTOMY FUSION  06/11/2011   Procedure: ANTERIOR CERVICAL DECOMPRESSION/DISCECTOMY FUSION 1 LEVEL/HARDWARE REMOVAL;  Surgeon: HeCharlie PitterMD;  [facility];  Service: Neurosurgery;  Laterality: N/A;  Cervical four-five anterior cervical decompression fusion with allograft and plating; Removal of Cervical five to seven plate  . bilateral cataracts removed    . caesarean section  1966/69/72   x 3  . CARDIAC CATHETERIZATION  90's and 2005  . CARPAL TUNNEL RELEASE Left 11/2013  . cataract surgery  2010  . COLECTOMY  2007   colon cancer  . COLONOSCOPY    . DILATION AND CURETTAGE OF UTERUS  02-19-11   &  POLYP REMOVAL  . GANGLION CYST EXCISION  > [id number]   removed from left pointer finger  . HEE[place]ox 2  . HYSTEROSCOPY W/D&C  02/19/2011   Procedure: DILATATION AND CURETTAGE /HYSTEROSCOPY;  Surgeon: TimAnastasio AuerbachD;  [facility];  Service: Gynecology;  Laterality: N/A;  requests one hour  . JOINT REPLACEMENT Left    knee  . knee arthrosocpy     bil;couple of years before knee replacement  . KNEE SURGERY     left TKA  .  [place] SURGERY  2008  . port a cath placed  2008  . port a cath removed  2008  . pyelonidal cystectomy     at age 8  . SHOULDER ARTHROSCOPY WITH SUBACROMIAL DECOMPRESSION AND OPEN ROTATOR C Right 03/16/2013   Procedure: RIGHT SHOULDER ARTHROSCOPY WITH SUBACROMIAL DECOMPRESSION AND OPEN ROTATOR CUFF REPAIR, OPEN DISTAL CLAVICLE  RESECTION POSSIBLE BICEP TENODESIS ;  Surgeon: Augustin Schooling, MD;  Location: [facility];  Service: Orthopedics;  Laterality: Right;  . TRIGGER FINGER RELEASE Left    thumb, tendonitis, tumor removed  . TUBAL LIGATION  1972    Current Medications: Current Meds  Medication Sig  . albuterol (PROVENTIL HFA;VENTOLIN HFA) 108 (90 BASE) MCG/ACT inhaler Inhale 2 puffs into the lungs every 6 (six) hours as needed for wheezing or shortness of breath.  [name] aspirin 81 MG tablet Take 81 mg by mouth 2 (two) times a week.  . diltiazem (CARDIZEM CD) 360 MG 24 hr capsule Take 1 capsule (360 mg total) by mouth daily.  . furosemide (LASIX) 40 MG tablet Take 40 mg by mouth daily as needed for fluid or edema.  [name] glucose blood (ONE TOUCH ULTRA TEST) test strip Use to check blood sugars twice a day Dx E11.9  . loperamide (IMODIUM) 2 MG capsule Take 4 mg by mouth as needed for diarrhea or loose stools.  [name] losartan (COZAAR) 100 MG tablet TAKE 1 TABLET EVERY DAY  . promethazine (PHENERGAN) 25 MG tablet Take 1 tablet (25 mg total) by mouth every 6 (six) hours as needed for nausea or vomiting.  . traMADol (ULTRAM) 50 MG tablet Take 50 mg  by mouth every 6 (six) hours as needed for moderate pain.  . [DISCONTINUED] diltiazem (CARDIZEM CD) 360 MG 24 hr capsule Take 1 capsule (360 mg total) by mouth daily. *Patient is overdue for an appointment and must call and schedule for further refills*     Allergies:   Benazepril; Lopressor [metoprolol]; Adhesive [tape]; Morphine; Nifedipine; and Piroxicam   Social History   Social History  . Marital status: Married    Spouse name: N/A  . Number of children: 3  . Years of education: N/A   Occupational History  . retired    Social History Main Topics  . Smoking status: Former Smoker    Packs/day: 1.50    Years: 30.00    Types: Cigarettes    Quit date: 02/23/1991  . Smokeless tobacco: Never Used     Comment: quit smoking 20yr ago  . Alcohol use No  . Drug use: No  . Sexual activity: Yes    Partners: Male    Birth control/ protection: Post-menopausal   Other Topics Concern  . None   Social History Narrative   Regular exercise- no.     Family History  Problem Relation Age of Onset  . Heart attack Mother   . Stroke Mother   . Hypertension Mother   . Heart disease Father   . Hypertension Sister   . Kidney cancer Sister     left  . Bladder Cancer Brother   . Liver cancer Brother     spread to colon  . Coronary artery disease Other   . Anesthesia problems Neg Hx   . Hypotension Neg Hx   . Malignant hyperthermia Neg Hx   . Pseudochol deficiency Neg Hx      ROS:   Please see the history of present illness.    Review of Systems  Cardiovascular: Positive  for dyspnea on exertion and irregular heartbeat.  Respiratory: Positive for shortness of breath and wheezing.   Musculoskeletal: Positive for back pain, joint pain, joint swelling and myalgias.   All other systems reviewed and are negative.   EKGs/Labs/Other Test Reviewed:    EKG:  EKG is  ordered today.  The ekg ordered today demonstrates NSR, HR 72, left axis deviation, no ST-TW changes, QTc 427 ms, no change  from prior tracings  Recent Labs: 07/15/2015: BUN 10; Creatinine, Ser 1.01; Hemoglobin 15.2; Platelets 276; Potassium 3.8; Sodium 137   Recent Lipid Panel    Component Value Date/Time   CHOL 196 02/28/2015 1202   TRIG 220.0 (H) 02/28/2015 1202   HDL 52.90 02/28/2015 1202   CHOLHDL 4 02/28/2015 1202   VLDL 44.0 (H) 02/28/2015 1202   LDLCALC (H) 06/05/2009 0547    132        Total Cholesterol/HDL:CHD Risk Coronary Heart Disease Risk Table                     Men   Women  1/2 Average Risk   3.4   3.3  Average Risk       5.0   4.4  2 X Average Risk   9.6   7.1  3 X Average Risk  23.4   11.0        Use the calculated Patient Ratio above and the CHD Risk Table to determine the patient's CHD Risk.        ATP III CLASSIFICATION (LDL):  <100     mg/dL   Optimal  100-129  mg/dL   Near or Above                    Optimal  130-159  mg/dL   Borderline  160-189  mg/dL   High  >190     mg/dL   Very High   LDLDIRECT 119.0 02/28/2015 1202     Physical Exam:    VS:  BP (!) 148/62   Pulse 72   Ht _0  (1.6 m)   Wt 229 lb 1.9 oz (103.9 kg)   LMP 12/30/2000   BMI 40.59 kg/m     Wt Readings from Last 3 Encounters:  06/02/16 229 lb 1.9 oz (103.9 kg)  05/25/16 227 lb (103 kg)  03/02/16 235 lb (106.6 kg)     Physical Exam Physical Exam  Constitutional: She is oriented to person, place, and time. She appears well-developed and well-nourished. No distress.  HENT:  Head: Normocephalic and atraumatic.  Eyes: No scleral icterus.  Neck: Normal range of motion. No JVD present.  Cardiovascular: Normal rate, regular rhythm, S1 normal and S2 normal.   Murmur heard.  Low-pitched systolic murmur is present with a grade of 2/6  at the upper right sternal border Pulmonary/Chest: Breath sounds normal. She has no wheezes. She has no rhonchi. She has no rales.  Abdominal: Soft. There is no tenderness.  Musculoskeletal: She exhibits no edema.  Neurological: She is alert and oriented to  person, place, and time.  Skin: Skin is warm and dry.  Psychiatric: She has a normal mood and affect.    ASSESSMENT:    1. Preoperative cardiovascular examination   2. PAF (paroxysmal atrial fibrillation) ([place])   3. Essential hypertension   4. Type 2 diabetes mellitus without complication, without long-term current use of insulin ([place])   5. Morbid obesity ([place])   6. Aortic valve stenosis, etiology of cardiac  valve disease unspecified    PLAN:    In order of problems listed above:  1. Preoperative cardiovascular examination -  She is able to achieve 4 METs or greater without symptoms to suggest angina. She does not have any unstable cardiac conditions at this time. She is maintaining normal sinus rhythm. She does not require further cardiac workup prior to noncardiac surgery and should be at excessive risk. Our service is available in the perioperative period as necessary.  2. PAF (paroxysmal atrial fibrillation) ([facility]) -  Maintaining sinus rhythm. She has less frequent episodes of PAF on higher dose diltiazem.  She never took Flecainide. She is not willing to take anticoagulation.  I had a long discussion with her regarding risk of stroke on no anticoagulation.  3. Essential hypertension - Blood pressure here is elevated. However, at home it is optimal. Continue to monitor.  4. Type 2 diabetes mellitus without complication, without long-term current use of insulin ([place]) - She is now on diet controlled therapy.   5. Morbid obesity ([place]) - She is trying to lose weight.  6. Aortic valve stenosis, etiology of cardiac valve disease unspecified - Mild by last echo in 2017.  Exam and symptoms are not suggestive of worsening.  No need for follow up echo at this time. Consider repeat echo in the next 1-2 years.    Dispo:  Return in about 6 months (around 12/02/2016) for Routine Follow Up, w/ Dr. Harrington Challenger.   Medication Adjustments/Labs and Tests Ordered: Current medicines are reviewed at  length with the patient today.  Concerns regarding medicines are outlined above.  Medication changes, Labs and Tests ordered today are outlined in the Patient Instructions noted below. Patient Instructions  Medication Instructions:  1. A REFILL HAS BEEN PLACED IN THE SYSTEM FOR DILTIAZEM; WHEN READY TO ORDER JUST CALL YOUR PHARMACY AND THEY WILL CALL us.   Labwork: NONE ORDERED  Testing/Procedures: NONE ORDERED  Follow-Up: Your physician wants you to follow-up in: [place] DR. ROSS You will receive a reminder letter in the mail two months in advance. If you don't receive a letter, please call our office to schedule the follow-up appointment.  Any Other Special Instructions Will Be Listed Below (If Applicable).  If you need a refill on your cardiac medications before your next appointment, please call your pharmacy.  Signed, Richardson Dopp, PA-C  06/02/2016 10:39 AM    [facility] [place], [place],   [address] Phone: [phone]; Fax: [phone]

## 2016-06-02 ENCOUNTER — Ambulatory Visit (INDEPENDENT_AMBULATORY_CARE_PROVIDER_SITE_OTHER): Payer: Medicare Other | Admitting: Physician Assistant

## 2016-06-02 ENCOUNTER — Encounter: Payer: Self-pay | Admitting: Physician Assistant

## 2016-06-02 VITALS — BP 148/62 | HR 72 | Ht 63.0 in | Wt 229.1 lb

## 2016-06-02 DIAGNOSIS — I10 Essential (primary) hypertension: Secondary | ICD-10-CM

## 2016-06-02 DIAGNOSIS — I480 Paroxysmal atrial fibrillation: Secondary | ICD-10-CM

## 2016-06-02 DIAGNOSIS — I350 Nonrheumatic aortic (valve) stenosis: Secondary | ICD-10-CM

## 2016-06-02 DIAGNOSIS — Z01810 Encounter for preprocedural cardiovascular examination: Secondary | ICD-10-CM | POA: Diagnosis not present

## 2016-06-02 DIAGNOSIS — E119 Type 2 diabetes mellitus without complications: Secondary | ICD-10-CM | POA: Diagnosis not present

## 2016-06-02 DIAGNOSIS — E6609 Other obesity due to excess calories: Secondary | ICD-10-CM | POA: Insufficient documentation

## 2016-06-02 MED ORDER — DILTIAZEM HCL ER COATED BEADS 360 MG PO CP24: 360.0000 mg | ORAL_CAPSULE | Freq: Every day | ORAL | 3 refills | Status: DC

## 2016-06-02 NOTE — Patient Instructions (Addendum)
Medication Instructions:  1. A REFILL HAS BEEN PLACED IN THE SYSTEM FOR DILTIAZEM; WHEN READY TO ORDER JUST CALL YOUR PHARMACY AND THEY WILL CALL us.   Labwork: NONE ORDERED  Testing/Procedures: NONE ORDERED  Follow-Up: Your physician wants you to follow-up in: [place] DR. ROSS You will receive a reminder letter in the mail two months in advance. If you don't receive a letter, please call our office to schedule the follow-up appointment.  Any Other Special Instructions Will Be Listed Below (If Applicable).  If you need a refill on your cardiac medications before your next appointment, please call your pharmacy.

## 2016-06-10 ENCOUNTER — Ambulatory Visit (INDEPENDENT_AMBULATORY_CARE_PROVIDER_SITE_OTHER): Payer: Medicare Other | Admitting: Internal Medicine

## 2016-06-10 ENCOUNTER — Encounter: Payer: Self-pay | Admitting: Internal Medicine

## 2016-06-10 DIAGNOSIS — J4521 Mild intermittent asthma with (acute) exacerbation: Secondary | ICD-10-CM | POA: Diagnosis not present

## 2016-06-10 MED ORDER — DOXYCYCLINE HYCLATE 100 MG PO TABS: 100.0000 mg | ORAL_TABLET | Freq: Two times a day (BID) | ORAL | 0 refills | Status: DC

## 2016-06-10 MED ORDER — PREDNISONE 20 MG PO TABS: 40.0000 mg | ORAL_TABLET | Freq: Every day | ORAL | 0 refills | Status: DC

## 2016-06-10 NOTE — Patient Instructions (Signed)
We have sent in prednisone. Take 2 pills daily for 5 days.   We have also sent in doxycycline for the lungs. Take 1 pill twice a day for 7 days to clear the lungs.   Start taking the sample we have given you 1 pill daily of the xyzal. When you run out you can switch to the generic zyrtec called cetirizine which is 10 mg daily.

## 2016-06-10 NOTE — Progress Notes (Signed)
   Subjective:    Patient ID: [name], female    DOB: 1940-11-15, 76 y.o.   MRN: [id number]  HPI The patient is a 76 YO female coming in for cough and congestion. She does have concurrent asthma. She is using her albuterol inhaler more often. Started about 5-6 days ago. Overall worsening. Lots of nose congestion and drainage. Some sinus pressure. She is getting SOB with walking and doing normal activity. She denies fevers but is having chills. She denies ear pain or discharge. Cough is productive yellow sputum. She is taking mucinex, benadryl, and robitussin for the symptoms which is not helping at all.   Review of Systems  Constitutional: Positive for activity change, appetite change, chills and fatigue. Negative for fever and unexpected weight change.  HENT: Positive for congestion, postnasal drip, rhinorrhea, sinus pain, sinus pressure and sore throat. Negative for ear discharge, ear pain, trouble swallowing and voice change.   Eyes: Negative.   Respiratory: Positive for cough, shortness of breath and wheezing.   Cardiovascular: Negative.   Gastrointestinal: Negative.   Musculoskeletal: Negative.   Skin: Negative.       Objective:   Physical Exam  Constitutional: She is oriented to person, place, and time. She appears well-developed and well-nourished.  HENT:  Head: Normocephalic and atraumatic.  Frontal sinus pressure, TMs normal bilaterally, oropharynx with redness and drainage  Eyes: EOM are normal.  Neck: No JVD present.  Cardiovascular: Normal rate and regular rhythm.   Pulmonary/Chest: Effort normal. No respiratory distress. She has wheezes. She has no rales. She exhibits no tenderness.  Scattered wheezing, right more than left which does not clear with coughing.   Abdominal: Soft.  Lymphadenopathy:    She has no cervical adenopathy.  Neurological: She is alert and oriented to person, place, and time.  Skin: Skin is warm and dry.   Vitals:   06/10/16 1617  BP:  140/70  Pulse: 69  Resp: 14  Temp: 98.8 F (37.1 C)  TempSrc: Oral  SpO2: 98%  Weight: 225 lb (102.1 kg)  Height: 5\' 3"  (1.6 m)      Assessment & Plan:

## 2016-06-10 NOTE — Progress Notes (Signed)
Pre visit review using our clinic review tool, if applicable. No additional management support is needed unless otherwise documented below in the visit note. 

## 2016-06-11 NOTE — Assessment & Plan Note (Signed)
With exacerbation today, rx for doxycycline and prednisone. She is also advised to start taking zyrtec daily for allergies as the pollen is likely worsening her symptoms.

## 2016-06-21 ENCOUNTER — Other Ambulatory Visit: Payer: Self-pay | Admitting: Internal Medicine

## 2016-06-24 ENCOUNTER — Other Ambulatory Visit: Payer: Self-pay | Admitting: Internal Medicine

## 2016-06-25 ENCOUNTER — Other Ambulatory Visit: Payer: Self-pay | Admitting: Physician Assistant

## 2016-06-25 ENCOUNTER — Other Ambulatory Visit: Payer: Self-pay | Admitting: Internal Medicine

## 2016-06-25 MED ORDER — DILTIAZEM HCL ER COATED BEADS 360 MG PO CP24: 360.0000 mg | ORAL_CAPSULE | Freq: Every day | ORAL | 3 refills | Status: DC

## 2016-07-08 ENCOUNTER — Other Ambulatory Visit: Payer: Self-pay | Admitting: Internal Medicine

## 2016-09-02 ENCOUNTER — Telehealth: Payer: Self-pay | Admitting: Physician Assistant

## 2016-09-02 NOTE — Telephone Encounter (Signed)
SPOKE WITH PT HAS HAD  AN EPISODE OF A FIB  LAST NIGHT  SOB ,AND WITH PAIN  ACROSS BACK AND SHOULDERS HAS TAKEN AN EXTRA  DILTIAZEM 240 MG  PRN  AROUND  1:00 AM  WITH  4  81 MG ASA .PTHAS  BEEN TAKING DILTIAZEM  360 MG  DAILY FOR  SOME TIME AND  HAS  BEEN UNDER CONTROL .PT  NOTED HEART RATE  THIS AM  OF  152 AROUND  10:23 PER  PT  HAD  JUST  TAKEN  DILTIAZEM  360 MG  AT 10:00 AM DISCUSSED WITH  DR ROSS PER  DR  ROSS  HAVE  PT   BE SEEN  NEXT WEEK  APPT  MADE  FOR  09-10-16 WITH BRTTIANY SIMMONS PA.PT NOTIFIED  OF  APPT TIME  AND  IF  FURTHER EPISODES OCCUR TO  GO TO NEAREST ER FOR EVAL AND TX .[place]

## 2016-09-02 NOTE — Telephone Encounter (Signed)
Patient c/o Palpitations:  High priority if patient c/o lightheadedness and shortness of breath.  1. How long have you been having palpitations? Last night  2. Are you currently experiencing lightheadedness and shortness of breath? yes 3. Have you checked your BP and heart rate? (document readings) 125/69  Hr 83       128/94     Hr 152 4. Are you experiencing any other symptoms? Chest pain  ?

## 2016-09-02 NOTE — Telephone Encounter (Signed)
NO ANSWER UNABLE TO LEAVE MESSAGE MAIL BOX  HAS NOT BEEN SET UP  WILL TRY LATER .Adonis Housekeeper

## 2016-09-09 ENCOUNTER — Other Ambulatory Visit: Payer: Self-pay | Admitting: Internal Medicine

## 2016-09-10 ENCOUNTER — Ambulatory Visit: Payer: Medicare Other | Admitting: Cardiology

## 2016-09-10 NOTE — Telephone Encounter (Signed)
Check [place] registry last [facility] pls advise in MD absent .[name]

## 2016-09-10 NOTE — Telephone Encounter (Signed)
[facility].../lbm

## 2016-11-13 ENCOUNTER — Other Ambulatory Visit: Payer: Self-pay | Admitting: Internal Medicine

## 2016-11-15 NOTE — Telephone Encounter (Signed)
Not on her med list/thx dmf

## 2016-11-23 ENCOUNTER — Other Ambulatory Visit: Payer: Self-pay | Admitting: Family

## 2017-01-05 ENCOUNTER — Other Ambulatory Visit: Payer: Self-pay | Admitting: Internal Medicine

## 2017-01-27 ENCOUNTER — Telehealth: Payer: Self-pay | Admitting: Internal Medicine

## 2017-01-27 NOTE — Telephone Encounter (Signed)
Copied from [place] [id number]. Topic: Inquiry >> Jan 27, 2017  3:46 PM [name] wrote: Reason for CRM: pt is needing a  refill on her  albuterol, pharmacy said to have her contact the office for refill, contact pt or pharmacy if needed

## 2017-01-28 ENCOUNTER — Other Ambulatory Visit: Payer: Self-pay

## 2017-01-28 MED ORDER — ALBUTEROL SULFATE HFA 108 (90 BASE) MCG/ACT IN AERS: 2.0000 | INHALATION_SPRAY | Freq: Four times a day (QID) | RESPIRATORY_TRACT | 0 refills | Status: DC | PRN

## 2017-01-28 NOTE — Telephone Encounter (Signed)
Sent refill to PCP waiting approval or denial

## 2017-02-07 ENCOUNTER — Other Ambulatory Visit: Payer: Self-pay | Admitting: Internal Medicine

## 2017-02-08 ENCOUNTER — Other Ambulatory Visit: Payer: Self-pay | Admitting: Internal Medicine

## 2017-02-14 ENCOUNTER — Other Ambulatory Visit (INDEPENDENT_AMBULATORY_CARE_PROVIDER_SITE_OTHER): Payer: Medicare Other

## 2017-02-14 ENCOUNTER — Encounter: Payer: Self-pay | Admitting: Internal Medicine

## 2017-02-14 ENCOUNTER — Ambulatory Visit (INDEPENDENT_AMBULATORY_CARE_PROVIDER_SITE_OTHER): Payer: Medicare Other | Admitting: Internal Medicine

## 2017-02-14 VITALS — BP 150/82 | HR 66 | Temp 97.7°F | Ht 63.0 in | Wt 234.0 lb

## 2017-02-14 DIAGNOSIS — E119 Type 2 diabetes mellitus without complications: Secondary | ICD-10-CM

## 2017-02-14 DIAGNOSIS — M5441 Lumbago with sciatica, right side: Secondary | ICD-10-CM

## 2017-02-14 DIAGNOSIS — G8929 Other chronic pain: Secondary | ICD-10-CM | POA: Diagnosis not present

## 2017-02-14 DIAGNOSIS — E1169 Type 2 diabetes mellitus with other specified complication: Secondary | ICD-10-CM

## 2017-02-14 DIAGNOSIS — E785 Hyperlipidemia, unspecified: Secondary | ICD-10-CM | POA: Diagnosis not present

## 2017-02-14 DIAGNOSIS — I10 Essential (primary) hypertension: Secondary | ICD-10-CM

## 2017-02-14 LAB — COMPREHENSIVE METABOLIC PANEL
ALBUMIN: 4.2 g/dL (ref 3.5–5.2)
ALK PHOS: 86 U/L (ref 39–117)
ALT: 15 U/L (ref 0–35)
AST: 16 U/L (ref 0–37)
BUN: 15 mg/dL (ref 6–23)
CO2: 30 mEq/L (ref 19–32)
CREATININE: 0.79 mg/dL (ref 0.40–1.20)
Calcium: 9.3 mg/dL (ref 8.4–10.5)
Chloride: 102 mEq/L (ref 96–112)
GFR: 75.44 mL/min (ref >60.00)
GLUCOSE: 128 mg/dL — AB (ref 70–99)
POTASSIUM: 3.8 meq/L (ref 3.5–5.1)
SODIUM: 139 meq/L (ref 135–145)
TOTAL PROTEIN: 6.9 g/dL (ref 6.0–8.3)
Total Bilirubin: 0.6 mg/dL (ref 0.2–1.2)

## 2017-02-14 LAB — LIPID PANEL
CHOLESTEROL: 197 mg/dL (ref 0–200)
HDL: 57.8 mg/dL (ref >39.00)
LDL CALC: 105 mg/dL — AB (ref 0–99)
NONHDL: 139.22
Total CHOL/HDL Ratio: 3
Triglycerides: 173 mg/dL — ABNORMAL HIGH (ref 0.0–149.0)
VLDL: 34.6 mg/dL (ref 0.0–40.0)

## 2017-02-14 LAB — HEMOGLOBIN A1C: HEMOGLOBIN A1C: 7.4 % — AB (ref 4.6–6.5)

## 2017-02-14 LAB — CBC
HCT: 44 % (ref 36.0–46.0)
Hemoglobin: 14.6 g/dL (ref 12.0–15.0)
MCHC: 33.1 g/dL (ref 30.0–36.0)
MCV: 87.4 fl (ref 78.0–100.0)
Platelets: 280 10*3/uL (ref 150.0–400.0)
RBC: 5.04 Mil/uL (ref 3.87–5.11)
RDW: 14.2 % (ref 11.5–15.5)
WBC: 8.7 10*3/uL (ref 4.0–10.5)

## 2017-02-14 MED ORDER — TRAMADOL HCL 50 MG PO TABS: 50.0000 mg | ORAL_TABLET | Freq: Every day | ORAL | 3 refills | Status: DC | PRN

## 2017-02-14 NOTE — Assessment & Plan Note (Signed)
Previously on lipitor and not taking now. Checking lipid panel and adjust for LDL goal <100.

## 2017-02-14 NOTE — Assessment & Plan Note (Signed)
Her goal is to lose 30 pounds this year and she is not taking any medications. She will change her diet and more exercise. She declines nutrition referral today. We will follow up in 6 months.

## 2017-02-14 NOTE — Assessment & Plan Note (Signed)
Checking Hga1c, lipid, CMP today and adjust as needed. Not on meds currently but she has been taking some old medications. Asked her not to take medications which are not currently prescribed for her. If she needs medication should have daily med. Taking ARB but no statin.

## 2017-02-14 NOTE — Progress Notes (Signed)
   Subjective:    Patient ID: [name], female    DOB: 11-11-1940, 76 y.o.   MRN: [id number]  HPI The patient is a 76 YO female coming in for many concerns including pain management (she has been on tramadol 1 pill daily for some time for chronic orthopedic issues of her entire body from neck to ankles, seeing orthopedics and they are giving some injections, 1 knee replaced and the other needs to be replaced, does take tylenol sometimes, gets muscle spasms from lumbar to cervical region which are 10/10 painful, needs refills today), and her morbid obesity (working on losing about 30 pounds in the upcoming year, she knows that this is causing her more ortho issues and pain, has diabetes and hypertension), and her blood pressure (has not taken meds this morning trying to get in to the office, taking her diltiazem and lasix and losartan and normally BP good at home, denies headaches or chest pains or SOB, denies side effects to meds) and her diabetes (she has some glipizide leftover at home which she takes 1/2 pill sometimes when her sugars are high, she has been eating poorly lately, uses activity and water to help bring them down, sometimes 250 or more at home).   Review of Systems  Constitutional: Positive for activity change and appetite change. Negative for chills, fatigue, fever and unexpected weight change.  HENT: Negative.   Eyes: Negative.   Respiratory: Negative for cough, chest tightness and shortness of breath.   Cardiovascular: Negative for chest pain, palpitations and leg swelling.  Gastrointestinal: Negative for abdominal distention, abdominal pain, constipation, diarrhea, nausea and vomiting.  Musculoskeletal: Positive for arthralgias, back pain, myalgias and neck pain. Negative for neck stiffness.  Skin: Negative.   Neurological: Negative.   Psychiatric/Behavioral: Negative.       Objective:   Physical Exam  Constitutional: She is oriented to person, place, and time. She  appears well-developed and well-nourished.  HENT:  Head: Normocephalic and atraumatic.  Eyes: EOM are normal.  Neck: Normal range of motion.  Cardiovascular: Normal rate.  Pulmonary/Chest: Effort normal and breath sounds normal. No respiratory distress. She has no wheezes. She has no rales.  Abdominal: Soft. Bowel sounds are normal. She exhibits no distension. There is no tenderness. There is no rebound.  Musculoskeletal: She exhibits tenderness. She exhibits no edema.  Neurological: She is alert and oriented to person, place, and time. Coordination normal.  Skin: Skin is warm and dry.  Psychiatric: She has a normal mood and affect.   Vitals:   02/14/17 0839 02/14/17 0935  BP: (!) 160/82 (!) 150/82  Pulse: 66   Temp: 97.7 F (36.5 C)   TempSrc: Oral   SpO2: 99%   Weight: 234 lb (106.1 kg)   Height: 5\' 3"  (1.6 m)       Assessment & Plan:

## 2017-02-14 NOTE — Assessment & Plan Note (Signed)
BP above goal today and she does not want to change given no meds taken yet today. If elevated again she will change. Taking diltiazem and losartan and lasix currently. Can always change to losartan/hctz and stop lasix if needed. Checking CMP and adjust as needed.

## 2017-02-14 NOTE — Patient Instructions (Signed)
We are checking the labs today and would like you to come back in about 3 months for a thorough check of the medical problems including diabetes and blood pressure.

## 2017-02-14 NOTE — Assessment & Plan Note (Signed)
Refill tramadol 1 pill daily. Checked [place] narcotic database to see that she has not been getting any controlled from alternative sources.

## 2017-02-18 ENCOUNTER — Telehealth: Payer: Self-pay | Admitting: Internal Medicine

## 2017-02-18 NOTE — Telephone Encounter (Signed)
Copied from [place]. Topic: Quick Communication - Lab Results >> Feb 18, 2017  3:45 PM [name], [name] wrote: Patient would like a call back about her lab results. She would like to know what they are. Please call patient back, thanks.

## 2017-02-18 NOTE — Telephone Encounter (Signed)
Called patient and given lab results, she expressed understanding,

## 2017-02-25 ENCOUNTER — Other Ambulatory Visit: Payer: Self-pay

## 2017-02-25 MED ORDER — LOSARTAN POTASSIUM 100 MG PO TABS: 100.0000 mg | ORAL_TABLET | Freq: Every day | ORAL | 3 refills | Status: DC

## 2017-03-02 ENCOUNTER — Telehealth: Payer: Self-pay

## 2017-03-02 NOTE — Telephone Encounter (Signed)
LVM for patient to call back because we have received a fax from [facility] asking for Rx refills but that Pharmacy is not of Preferred Pharmacies in her chart. Just want to know if she is wanting Rx sent to [facility] or not.

## 2017-03-03 ENCOUNTER — Other Ambulatory Visit: Payer: Self-pay

## 2017-03-03 MED ORDER — LOSARTAN POTASSIUM 100 MG PO TABS: 100.0000 mg | ORAL_TABLET | Freq: Every day | ORAL | 3 refills | Status: DC

## 2017-03-03 MED ORDER — FUROSEMIDE 40 MG PO TABS: 40.0000 mg | ORAL_TABLET | Freq: Every day | ORAL | 2 refills | Status: DC | PRN

## 2017-03-03 MED ORDER — DILTIAZEM HCL ER COATED BEADS 360 MG PO CP24: 360.0000 mg | ORAL_CAPSULE | Freq: Every day | ORAL | 3 refills | Status: DC

## 2017-03-15 ENCOUNTER — Telehealth: Payer: Self-pay | Admitting: Internal Medicine

## 2017-03-15 NOTE — Telephone Encounter (Signed)
Copied from [place] [phone]. Topic: Quick Communication - Rx Refill/Question >> Mar 15, 2017  9:32 AM Robina Ade, [name] wrote: Medication: diltiazem (CARDIZEM CD) 360 MG 24 hr capsule for 90 day refill Has the patient contacted their pharmacy? Yes (Agent: If no, request that the patient contact the pharmacy for the refill.) Preferred Pharmacy (with phone number or street name): [place], [place]: Please be advised that RX refills may take up to 3 business days. We ask that you follow-up with your pharmacy.

## 2017-03-18 ENCOUNTER — Other Ambulatory Visit: Payer: Self-pay | Admitting: Internal Medicine

## 2017-03-18 NOTE — Telephone Encounter (Signed)
Requesting refill of Ultram  LOV 02/14/17 with Dr. Sharlet Salina  [facility] 02/14/17 #30 with 3 refills.  NEW PHARMACY: [facility] - [place], [place]    [phone] (Phone) [phone] (Fax)

## 2017-03-18 NOTE — Telephone Encounter (Signed)
Copied from [place] [id number]. Topic: Quick Communication - Rx Refill/Question >> Mar 18, 2017  9:22 AM Aurelio Brash B wrote: Medication: traMADol (ULTRAM) 50 MG tablet and diltiazem (CARDIZEM CD) 360 MG 24 hr capsule  Has the patient contacted their pharmacy? New pharmacy   (Agent: If no, request that the patient contact the pharmacy for the refill.)   Preferred Pharmacy (with phone number or street name): [place], [place] [phone] (Phone) [phone] (Fax)     Agent: Please be advised that RX refills may take up to 3 business days. We ask that you follow-up with your pharmacy.

## 2017-03-23 ENCOUNTER — Telehealth: Payer: Self-pay | Admitting: Internal Medicine

## 2017-03-23 NOTE — Telephone Encounter (Signed)
Copied from [place] [phone]. Topic: Inquiry >> Mar 23, 2017 12:36 PM Patrice Paradise wrote: Reason for CRM: Patient is requesting a call back from Dr. Sharlet Salina assistant @ [phone]. She is requesting a RX for a muscle relaxer.  [id number] [place], [place] - [address] [place] [address] Phone: [phone] Fax: [phone]

## 2017-03-23 NOTE — Telephone Encounter (Signed)
Ret'd call to pt. to discuss her request for an Rx for muscle relaxant.  Reported she has back problems and hx of prev. Back surgery in 2008.  Stated she has a lot of pain in the right sciatic nerve.  Stated it is extremely painful to sit on the toilet; described "it is like hot knives sticking in the lower back."  Also, c/o that it hurts to lift her leg.  Stated she also has neck spasms, and is trying to get an appt. with [facility], and is awaiting a call back.  Reported hx. of DJD.  Is requesting Dr. Sharlet Salina to order a muscle relaxant until she can get an appt. with Orthopedics.  Advised will send message to Dr. Sharlet Salina.  Agrees with plan.

## 2017-03-24 MED ORDER — BACLOFEN 10 MG PO TABS: 10.0000 mg | ORAL_TABLET | Freq: Two times a day (BID) | ORAL | 0 refills | Status: DC | PRN

## 2017-03-24 NOTE — Telephone Encounter (Signed)
Have sent in baclofen that she can take up to twice a day as needed.

## 2017-03-24 NOTE — Telephone Encounter (Signed)
Notified pt rx has been sent to your local pharmacy

## 2017-03-25 DIAGNOSIS — M5136 Other intervertebral disc degeneration, lumbar region: Secondary | ICD-10-CM | POA: Diagnosis not present

## 2017-03-25 DIAGNOSIS — M545 Low back pain: Secondary | ICD-10-CM | POA: Diagnosis not present

## 2017-03-25 DIAGNOSIS — M4316 Spondylolisthesis, lumbar region: Secondary | ICD-10-CM | POA: Diagnosis not present

## 2017-03-30 ENCOUNTER — Telehealth: Payer: Self-pay | Admitting: Internal Medicine

## 2017-03-30 NOTE — Telephone Encounter (Signed)
Copied from [place] [phone]. Topic: Quick Communication - See Telephone Encounter >> Mar 30, 2017 10:47 AM Conception Chancy, NT wrote: CRM for notification. See Telephone encounter for:  03/30/17.  Pt is requesting a refill on tramadol 50mg  sent into her [employer] pof. Thank you

## 2017-03-31 NOTE — Telephone Encounter (Signed)
Does not need refill. Sent in 4 month supply on 02/14/17. Not due until late March

## 2017-04-04 ENCOUNTER — Telehealth: Payer: Self-pay | Admitting: Internal Medicine

## 2017-04-04 MED ORDER — DILTIAZEM HCL ER BEADS 360 MG PO CP24: 360.0000 mg | ORAL_CAPSULE | Freq: Every day | ORAL | 0 refills | Status: DC

## 2017-04-04 MED ORDER — DILTIAZEM HCL ER BEADS 360 MG PO CP24: 360.0000 mg | ORAL_CAPSULE | Freq: Every day | ORAL | 3 refills | Status: DC

## 2017-04-04 NOTE — Telephone Encounter (Signed)
Sent in 30 day to local and 90 day to mail order.

## 2017-04-04 NOTE — Telephone Encounter (Signed)
Copied from [place]. Topic: Quick Communication - See Telephone Encounter >> Apr 04, 2017 11:34 AM Ether Griffins B wrote: CRM for notification. See Telephone encounter for:  [name] with [facility] calling in regards to the diltiazem and wanting to see if Dr. Sharlet Salina would change to a different brand name the taztiaxt. Call back number [phone].  04/04/17.

## 2017-04-04 NOTE — Telephone Encounter (Signed)
Pharmacy states that they wanted to switch to the [facility] that is covered by patients insurance which is the taztiaxt. The diltiazem is not covered anymore.

## 2017-04-04 NOTE — Telephone Encounter (Signed)
Called basil back from [place] and he states patient is aware and is wanting to change medication to the taztia xt same strength instead of diltiazem since it is covered by her insurance. Is also asking for a small supply to be sent to local pharmacy as she is almost out of medication

## 2017-04-04 NOTE — Telephone Encounter (Signed)
[name] from [place] ext [phone] calling [name] back about a RX

## 2017-05-16 ENCOUNTER — Ambulatory Visit: Payer: Medicare Other | Admitting: Internal Medicine

## 2017-05-16 ENCOUNTER — Encounter: Payer: Self-pay | Admitting: Gastroenterology

## 2017-07-05 ENCOUNTER — Encounter: Payer: Self-pay | Admitting: Gastroenterology

## 2017-07-20 ENCOUNTER — Other Ambulatory Visit: Payer: Self-pay

## 2017-07-20 ENCOUNTER — Encounter (HOSPITAL_COMMUNITY): Payer: Self-pay | Admitting: Emergency Medicine

## 2017-07-20 ENCOUNTER — Emergency Department (HOSPITAL_COMMUNITY): Payer: Medicare HMO

## 2017-07-20 ENCOUNTER — Ambulatory Visit: Payer: Self-pay | Admitting: *Deleted

## 2017-07-20 ENCOUNTER — Emergency Department (HOSPITAL_COMMUNITY)
Admission: EM | Admit: 2017-07-20 | Discharge: 2017-07-20 | Disposition: A | Discharge status: Home / Self Care | Payer: Medicare HMO | Attending: Emergency Medicine | Admitting: Emergency Medicine

## 2017-07-20 DIAGNOSIS — I10 Essential (primary) hypertension: Secondary | ICD-10-CM | POA: Diagnosis not present

## 2017-07-20 DIAGNOSIS — E119 Type 2 diabetes mellitus without complications: Secondary | ICD-10-CM | POA: Diagnosis not present

## 2017-07-20 DIAGNOSIS — R42 Dizziness and giddiness: Secondary | ICD-10-CM

## 2017-07-20 DIAGNOSIS — S80862A Insect bite (nonvenomous), left lower leg, initial encounter: Secondary | ICD-10-CM | POA: Insufficient documentation

## 2017-07-20 DIAGNOSIS — W57XXXA Bitten or stung by nonvenomous insect and other nonvenomous arthropods, initial encounter: Secondary | ICD-10-CM | POA: Diagnosis not present

## 2017-07-20 DIAGNOSIS — Y999 Unspecified external cause status: Secondary | ICD-10-CM | POA: Diagnosis not present

## 2017-07-20 DIAGNOSIS — R0789 Other chest pain: Secondary | ICD-10-CM | POA: Diagnosis not present

## 2017-07-20 DIAGNOSIS — Y929 Unspecified place or not applicable: Secondary | ICD-10-CM | POA: Insufficient documentation

## 2017-07-20 DIAGNOSIS — S70362A Insect bite (nonvenomous), left thigh, initial encounter: Secondary | ICD-10-CM | POA: Diagnosis not present

## 2017-07-20 DIAGNOSIS — Z87891 Personal history of nicotine dependence: Secondary | ICD-10-CM | POA: Diagnosis not present

## 2017-07-20 DIAGNOSIS — J45909 Unspecified asthma, uncomplicated: Secondary | ICD-10-CM | POA: Insufficient documentation

## 2017-07-20 DIAGNOSIS — I4891 Unspecified atrial fibrillation: Secondary | ICD-10-CM | POA: Insufficient documentation

## 2017-07-20 DIAGNOSIS — Y939 Activity, unspecified: Secondary | ICD-10-CM | POA: Diagnosis not present

## 2017-07-20 DIAGNOSIS — R002 Palpitations: Secondary | ICD-10-CM | POA: Diagnosis not present

## 2017-07-20 DIAGNOSIS — Z79899 Other long term (current) drug therapy: Secondary | ICD-10-CM | POA: Insufficient documentation

## 2017-07-20 LAB — COMPREHENSIVE METABOLIC PANEL
ALBUMIN: 4 g/dL (ref 3.5–5.0)
ALK PHOS: 83 U/L (ref 38–126)
ALT: 17 U/L (ref 14–54)
ANION GAP: 9 (ref 5–15)
AST: 19 U/L (ref 15–41)
BUN: 14 mg/dL (ref 6–20)
CHLORIDE: 102 mmol/L (ref 101–111)
CO2: 30 mmol/L (ref 22–32)
Calcium: 9.5 mg/dL (ref 8.9–10.3)
Creatinine, Ser: 0.87 mg/dL (ref 0.44–1.00)
GFR calc Af Amer: >60 mL/min (ref >60)
GFR calc non Af Amer: >60 mL/min (ref >60)
GLUCOSE: 137 mg/dL — AB (ref 65–99)
POTASSIUM: 4.1 mmol/L (ref 3.5–5.1)
SODIUM: 141 mmol/L (ref 135–145)
Total Bilirubin: 0.8 mg/dL (ref 0.3–1.2)
Total Protein: 6.6 g/dL (ref 6.5–8.1)

## 2017-07-20 LAB — PROTIME-INR
INR: 1.02
Prothrombin Time: 13.3 seconds (ref 11.4–15.2)

## 2017-07-20 LAB — CBC
HEMATOCRIT: 45.6 % (ref 36.0–46.0)
Hemoglobin: 15.3 g/dL — ABNORMAL HIGH (ref 12.0–15.0)
MCH: 29.3 pg (ref 26.0–34.0)
MCHC: 33.6 g/dL (ref 30.0–36.0)
MCV: 87.4 fL (ref 78.0–100.0)
Platelets: 249 10*3/uL (ref 150–400)
RBC: 5.22 MIL/uL — ABNORMAL HIGH (ref 3.87–5.11)
RDW: 13.3 % (ref 11.5–15.5)
WBC: 7.4 10*3/uL (ref 4.0–10.5)

## 2017-07-20 LAB — DIFFERENTIAL
Abs Immature Granulocytes: 0 10*3/uL (ref 0.0–0.1)
BASOS ABS: 0 10*3/uL (ref 0.0–0.1)
BASOS PCT: 1 %
EOS ABS: 0.2 10*3/uL (ref 0.0–0.7)
EOS PCT: 3 %
IMMATURE GRANULOCYTES: 0 %
LYMPHS PCT: 34 %
Lymphs Abs: 2.5 10*3/uL (ref 0.7–4.0)
Monocytes Absolute: 0.5 10*3/uL (ref 0.1–1.0)
Monocytes Relative: 6 %
NEUTROS PCT: 56 %
Neutro Abs: 4.2 10*3/uL (ref 1.7–7.7)

## 2017-07-20 LAB — I-STAT TROPONIN, ED
TROPONIN I, POC: 0.01 ng/mL (ref 0.00–0.08)
Troponin i, poc: 0.01 ng/mL (ref 0.00–0.08)

## 2017-07-20 LAB — APTT: APTT: 31 s (ref 24–36)

## 2017-07-20 MED ORDER — DOXYCYCLINE HYCLATE 100 MG PO TABS
100.0000 mg | ORAL_TABLET | Freq: Once | ORAL | Status: AC
  Administered 2017-07-20: 100 mg via ORAL
  Filled 2017-07-20: qty 1

## 2017-07-20 MED ORDER — DOXYCYCLINE HYCLATE 100 MG PO CAPS: 100.0000 mg | ORAL_CAPSULE | Freq: Two times a day (BID) | ORAL | 0 refills | Status: DC

## 2017-07-20 NOTE — Discharge Instructions (Signed)
Please read and follow all provided instructions.  Your diagnoses today include:  1. Tick bite, initial encounter   2. Dizziness   3. Palpitations     Tests performed today include:  Blood counts and electrolytes -no concerning findings  EKG of your heart and blood test for heart muscle damage -no sign of heart attack  CT of your brain -no sign of stroke or other problems  Lyme disease testing -should be followed up by your primary care physician  Vital signs. See below for your results today.   Medications prescribed:   Doxycycline - antibiotic  You have been prescribed an antibiotic medicine: take the entire course of medicine even if you are feeling better. Stopping early can cause the antibiotic not to work.  Take any prescribed medications only as directed.  Home care instructions:  Follow any educational materials contained in this packet.  BE VERY CAREFUL not to take multiple medicines containing Tylenol (also called acetaminophen). Doing so can lead to an overdose which can damage your liver and cause liver failure and possibly death.   Follow-up instructions: Please follow-up with your primary care provider in the next 2 days for further evaluation of your symptoms.   Return instructions:   Please return to the Emergency Department if you experience worsening symptoms.   Return to the emergency department if you develop persistent chest pain or shortness of breath.  Return if you have weakness in your arms or legs, slurred speech, trouble walking or talking, confusion, or trouble with your balance.   Please return if you have any other emergent concerns.  Additional Information:  Your vital signs today were: BP (!) 172/70 (BP Location: Left Arm)    Pulse 67    Temp 97.8 F (36.6 C)    Resp 14    LMP 12/30/2000    SpO2 97%  If your blood pressure (BP) was elevated above 135/85 this visit, please have this repeated by your doctor within one  month. --------------

## 2017-07-20 NOTE — Telephone Encounter (Signed)
Originally called with Lyme's disease questions. She found a small tick on her Lt lower leg that was attached about a week. Negative for all lyme's symptoms except fatigue. As conversation goes on, she stated she has had the following symptoms worsen of the last 24 hours: nause, dizziness(unsure if vertigo), blurred vision, feeling off balance with staggering to the right, and chest discomfort at the sternum this morning that lasted around 30 minutes then resolved. She stated she's has these symptoms along with her blood pressure readings high for some time(could not say exactly). B/p today was 170/92, hr 66. She has Afib. Advised patient to seek emergency treatment. Her husband will drive her to [facility] ER at this time. Notifying Dr. Sharlet Salina. Reason for Disposition . [1] Chest pain lasts > 5 minutes AND [2] history of heart disease  (i.e., heart attack, bypass surgery, angina, angioplasty, CHF)  Answer Assessment - Initial Assessment Questions 1. TYPE of TICK: "Is it a wood tick or a deer tick?" If unsure, ask: "What size was the tick?" "Did it look more like a watermelon seed or a poppy seed?"      Unsure of which tick. Was the size of a grape seed. 2. LOCATION: "Where is the tick bite located?"      Lt lower leg. 3. ONSET: "How long do you think the tick was attached before you removed it?" (Hours or days)      She noticed the spot but did not remove it until one week later, not knowing it was a tick at first.  4. TETANUS: "When was the last tetanus booster?"      yes 5. PREGNANCY: "Is there any chance you are pregnant?" "When was your last menstrual period?"     no  Answer Assessment - Initial Assessment Questions 1. BLOOD PRESSURE: "What is the blood pressure?" "Did you take at least two measurements 5 minutes apart?"     170/92 at 0430 this morning. Hr 66 bpm 2. ONSET: "When did you take your blood pressure?"     0430 today 3. HOW: "How did you obtain the blood pressure?" (e.g., visiting  nurse, automatic home BP monitor)     Home auto monitor 4. HISTORY: "Do you have a history of high blood pressure?"     yes 5. MEDICATIONS: "Are you taking any medications for blood pressure?" "Have you missed any doses recently?"    Losartan 100 mg once daily. Has not missed any doses. 6. OTHER SYMPTOMS: "Do you have any symptoms?" (e.g., headache, chest pain, blurred vision, difficulty breathing, weakness)     blurred vision, fatigue, chest discomfort today at sternum, 30-40 minutes in duration. Feels she is off balance and sways to the right. Dizzy even sitting down. Head feels foggy and heavy.  7. PREGNANCY: "Is there any chance you are pregnant?" "When was your last menstrual period?"     no  Protocols used: HIGH BLOOD PRESSURE-A-AH, TICK BITE-A-AH

## 2017-07-20 NOTE — ED Notes (Signed)
Results reviewed.  No changes in acuity at this time 

## 2017-07-20 NOTE — ED Triage Notes (Signed)
Patient presents to ED fro assessment of dizziness, chest pressure, fatigue, some SOB, and a tick bite to the left shin (no bullseye noted).  No focal neuro deficits noted in triage.

## 2017-07-20 NOTE — ED Provider Notes (Signed)
[facility] EMERGENCY DEPARTMENT Provider Note   CSN: [id number] Arrival date & time: 07/20/17  1044     History   Chief Complaint Chief Complaint  Patient presents with  . Chest Pain  . Tick Removal  . Dizziness    HPI [name] is a 77 y.o. female.  Patient presents to the emergency department today with multiple complaints.  She contacted her primary care doctor today who recommended evaluation in the emergency department due to some intermittent chest pains and palpitations as well as imbalance.  Patient also reports generalized dizziness described as a spinning sensation that is intermittent with blurred vision, not currently present, fatigue, some mild shortness of breath.  Patient does have a history of atrial fibrillation but states that she can tell when she is in this rhythm and that she currently does not feel like she is.  Patient reports a tick bite about a week and a half ago.  She had a small tick that she thought was a scab that was on her skin for approximately 1 week.  Afterwards she developed a circular rash on her left thigh that was away from the tick bite. Patient denies focal signs of stroke including: facial droop, slurred speech, aphasia, weakness/numbness in extremities, imbalance/trouble walking. No fever, other rash. No neck pain or headache. The onset of this condition was acute. The course is constant. Aggravating factors: none. Alleviating factors: none.        Past Medical History:  Diagnosis Date  . Allergic rhinitis   . Anxiety    and panic attacks;pt states that she is claustrophobic  . Asthma   . Atrial fibrillation ([place])    takes Coumadin daily-instructed to stop  . Bronchitis    in Jan 2013-uses Albuterol prn  . Bronchitis, acute 03-01-11   FINSHED Z-PAK-STILL ON PREDNISONE NOW  . Chronic back pain    hx buldging disc  . Colon cancer ([place]) 2007   s/p surgery and chemo  . Constipation    takes Colace prn  .  Diabetes mellitus    glimepiride  . Dizziness    occasionally  . DJD (degenerative joint disease)   . Family history of anesthesia complication    daughter gets PONV  . Family hx of colon cancer   . Fibromyalgia    but doesn't take any meds  . Gastric ulcer   . GERD (gastroesophageal reflux disease)    uses Tums prn  . Gout    takes Colchicine prn  . Hemorrhoids   . History of blood clots    right lung in early 90's  . History of colon polyps   . History of migraine headaches    last migraine 50yrs ago;Pt states she does get sinus headaches  . History of PSVT (paroxysmal supraventricular tachycardia)   . History of shingles   . HLD (hyperlipidemia)    pt doesn't take any medication for this  . Hypertension    takes Micardis and Diltiazem daily  . Nocturia   . Numbness    left hand  . Osteoarthritis   . Peptic ulcer disease   . Peripheral edema    takes Furosemide every other day  . Peripheral edema    takes Lasix daily  . Rash    on neck and started after she started taking her beta blocker 30yrs ago;medical MD has seen this  . Shortness of breath    pt states that she can be sitting as well  as exertion and get short of breath  . Sleep apnea    doesn't use CPAP  . Spinal headache   . Urinary frequency   . Urinary frequency   . Yeast infection of the vagina    being treated for this now by dr.fontaine    Patient Active Problem List   Diagnosis Date Noted  . Morbid obesity ([place]) 06/02/2016  . Renal cyst 01/12/2016  . Chronic back pain 05/23/2015  . PAF (paroxysmal atrial fibrillation) ([place]) 05/22/2015  . Degeneration of intervertebral disc of lumbosacral region 04/11/2015  . Fibromyalgia 04/11/2015  . Hyperlipidemia associated with type 2 diabetes mellitus ([place]) 02/10/2011  . SLEEP APNEA, OBSTRUCTIVE 04/24/2010  . Diabetes mellitus type 2, uncomplicated ([place]) 44/04/4740  . History of malignant neoplasm of large intestine 12/25/2008  . Essential hypertension  01/03/2007  . Asthma 01/03/2007    Past Surgical History:  Procedure Laterality Date  . ANKLE SURGERY     tumor-benign removed left ankle  . ANTERIOR CERVICAL DECOMP/DISCECTOMY FUSION  01/12/2011   Procedure: ANTERIOR CERVICAL DECOMPRESSION/DISCECTOMY FUSION 2 LEVELS;  Surgeon: Cooper Render Pool;  Location: [facility];  Service: Neurosurgery;  Laterality: Bilateral;  Cervical five-six, six-seven anterior cervical discectomy and fusion with allograft and plating  . ANTERIOR CERVICAL DECOMP/DISCECTOMY FUSION  06/11/2011   Procedure: ANTERIOR CERVICAL DECOMPRESSION/DISCECTOMY FUSION 1 LEVEL/HARDWARE REMOVAL;  Surgeon: Charlie Pitter, MD;  Location: [facility];  Service: Neurosurgery;  Laterality: N/A;  Cervical four-five anterior cervical decompression fusion with allograft and plating; Removal of Cervical five to seven plate  . bilateral cataracts removed    . caesarean section  1966/69/72   x 3  . CARDIAC CATHETERIZATION  90's and 2005  . CARPAL TUNNEL RELEASE Left 11/2013  . cataract surgery  2010  . COLECTOMY  2007   colon cancer  . COLONOSCOPY    . DILATION AND CURETTAGE OF UTERUS  02-19-11   & POLYP REMOVAL  . GANGLION CYST EXCISION  > 41yrs ago   removed from left pointer finger  . [place]   x 2  . HYSTEROSCOPY W/D&C  02/19/2011   Procedure: DILATATION AND CURETTAGE /HYSTEROSCOPY;  Surgeon: Anastasio Auerbach, MD;  Location: [facility];  Service: Gynecology;  Laterality: N/A;  requests one hour  . JOINT REPLACEMENT Left    knee  . knee arthrosocpy     bil;couple of years before knee replacement  . KNEE SURGERY     left TKA  . [place] SURGERY  2008  . port a cath placed  2008  . port a cath removed  2008  . pyelonidal cystectomy     at age 53  . SHOULDER ARTHROSCOPY WITH SUBACROMIAL DECOMPRESSION AND OPEN ROTATOR C Right 03/16/2013   Procedure: RIGHT SHOULDER ARTHROSCOPY WITH SUBACROMIAL DECOMPRESSION AND OPEN ROTATOR CUFF REPAIR, OPEN DISTAL CLAVICLE  RESECTION  POSSIBLE BICEP TENODESIS ;  Surgeon: Augustin Schooling, MD;  Location: [facility];  Service: Orthopedics;  Laterality: Right;  . TRIGGER FINGER RELEASE Left    thumb, tendonitis, tumor removed  . TUBAL LIGATION  1972     OB History    Gravida  3   Para  3   Term      Preterm      AB      Living  3     SAB      TAB      Ectopic      Multiple  Live Births               Home Medications    Prior to Admission medications   Medication Sig Start Date End Date Taking? Authorizing Provider  albuterol (PROVENTIL HFA;VENTOLIN HFA) 108 (90 Base) MCG/ACT inhaler Inhale 2 puffs into the lungs every 6 (six) hours as needed for wheezing or shortness of breath. 01/28/17   Hoyt Koch, MD  aspirin 81 MG tablet Take 81 mg by mouth 2 (two) times a week.    [provider]  baclofen (LIORESAL) 10 MG tablet Take 1 tablet (10 mg total) by mouth 2 (two) times daily as needed for muscle spasms. 03/24/17   Hoyt Koch, MD  diltiazem (TAZTIA XT) 360 MG 24 hr capsule Take 1 capsule (360 mg total) by mouth daily. 04/04/17   Hoyt Koch, MD  furosemide (LASIX) 40 MG tablet Take 1 tablet (40 mg total) by mouth daily as needed for fluid or edema. 03/03/17   Hoyt Koch, MD  glucose blood (ONE TOUCH ULTRA TEST) test strip USE TO CHECK BLOOD SUGARS TWICE A DAY DX E11.9 02/07/17   Hoyt Koch, MD  loperamide (IMODIUM) 2 MG capsule Take 4 mg by mouth as needed for diarrhea or loose stools.    [provider]  losartan (COZAAR) 100 MG tablet Take 1 tablet (100 mg total) by mouth daily. 03/03/17   Hoyt Koch, MD  predniSONE (DELTASONE) 20 MG tablet Take 2 tablets (40 mg total) by mouth daily with breakfast. Patient not taking: Reported on 02/14/2017 06/10/16   Hoyt Koch, MD  promethazine (PHENERGAN) 25 MG tablet Take 1 tablet (25 mg total) by mouth every 6 (six) hours as needed for nausea or vomiting. 08/13/15   Hoyt Koch, MD  traMADol (ULTRAM) 50 MG tablet Take 1 tablet (50 mg total) by mouth daily as needed. 02/14/17   Hoyt Koch, MD  digoxin (LANOXIN) 0.125 MG tablet Take 125 mcg by mouth daily.  05/18/11  [provider]    Family History Family History  Problem Relation Age of Onset  . Heart attack Mother   . Stroke Mother   . Hypertension Mother   . Heart disease Father   . Hypertension Sister   . Kidney cancer Sister        left  . Bladder Cancer Brother   . Liver cancer Brother        spread to colon  . Coronary artery disease Other   . Anesthesia problems Neg Hx   . Hypotension Neg Hx   . Malignant hyperthermia Neg Hx   . Pseudochol deficiency Neg Hx     Social History Social History   Tobacco Use  . Smoking status: Former Smoker    Packs/day: 1.50    Years: 30.00    Pack years: 45.00    Types: Cigarettes    Last attempt to quit: 02/23/1991    Years since quitting: 26.4  . Smokeless tobacco: Never Used  . Tobacco comment: quit smoking 52yrs ago  Substance Use Topics  . Alcohol use: No    Alcohol/week: 0.0 oz  . Drug use: No     Allergies   Benazepril; Lopressor [metoprolol]; Adhesive [tape]; Morphine; Nifedipine; and Piroxicam   Review of Systems Review of Systems  Constitutional: Positive for fatigue. Negative for diaphoresis and fever.  Eyes: Positive for visual disturbance. Negative for redness.  Respiratory: Positive for shortness of breath. Negative for cough.  Cardiovascular: Positive for chest pain. Negative for palpitations and leg swelling.  Gastrointestinal: Positive for nausea. Negative for abdominal pain and vomiting.  Genitourinary: Negative for dysuria.  Musculoskeletal: Negative for back pain and neck pain.  Skin: Positive for rash.  Neurological: Positive for dizziness and weakness. Negative for syncope and light-headedness.  Psychiatric/Behavioral: The patient is not nervous/anxious.      Physical Exam Updated  Vital Signs BP (!) 172/70 (BP Location: Left Arm)   Pulse 67   Temp 98.3 F (36.8 C) (Skin)   Resp 14   LMP 12/30/2000   SpO2 97%   Physical Exam  Constitutional: She is oriented to person, place, and time. She appears well-developed and well-nourished.  HENT:  Head: Normocephalic and atraumatic.  Right Ear: Tympanic membrane, external ear and ear canal normal.  Left Ear: Tympanic membrane, external ear and ear canal normal.  Nose: Nose normal.  Mouth/Throat: Uvula is midline, oropharynx is clear and moist and mucous membranes are normal.  Eyes: Pupils are equal, round, and reactive to light. Conjunctivae, EOM and lids are normal. Right eye exhibits no nystagmus. Left eye exhibits no nystagmus.  Neck: Normal range of motion. Neck supple.  Cardiovascular: Normal rate and regular rhythm.  Pulmonary/Chest: Effort normal and breath sounds normal.  Abdominal: Soft. There is no tenderness.  Musculoskeletal:       Cervical back: She exhibits normal range of motion, no tenderness and no bony tenderness.  Neurological: She is alert and oriented to person, place, and time. She has normal strength and normal reflexes. No cranial nerve deficit or sensory deficit. She displays a negative Romberg sign. Coordination and gait normal. GCS eye subscore is 4. GCS verbal subscore is 5. GCS motor subscore is 6.  Skin: Skin is warm and dry.  Light annular rash to the upper left thigh. Location of tick-bite left anterior calf with small papule but no EM.  Psychiatric: She has a normal mood and affect.  Nursing note and vitals reviewed.    ED Treatments / Results  Labs (all labs ordered are listed, but only abnormal results are displayed) Labs Reviewed  CBC - Abnormal; Notable for the following components:      Result Value   RBC 5.22 (*)    Hemoglobin 15.3 (*)    All other components within normal limits  COMPREHENSIVE METABOLIC PANEL - Abnormal; Notable for the following components:   Glucose,  Bld 137 (*)    All other components within normal limits  PROTIME-INR  APTT  DIFFERENTIAL  B. BURGDORFI ANTIBODIES  I-STAT TROPONIN, ED  I-STAT TROPONIN, ED    EKG EKG Interpretation  Date/Time:  Wednesday Jul 20 2017 11:15:44 EDT Ventricular Rate:  64 PR Interval:  188 QRS Duration: 82 QT Interval:  420 QTC Calculation: 433 R Axis:   -46 Text Interpretation:  Normal sinus rhythm Low voltage QRS Left anterior fascicular block Cannot rule out Anterior infarct , age undetermined Abnormal ECG Confirmed by Davonna Belling [phone]) on 07/20/2017 5:56:54 PM   Radiology Ct Head Wo Contrast  Result Date: 07/20/2017 CLINICAL DATA:  Episodic vertigo EXAM: CT HEAD WITHOUT CONTRAST TECHNIQUE: Contiguous axial images were obtained from the base of the skull through the vertex without intravenous contrast. COMPARISON:  None. FINDINGS: Brain: The ventricles are normal in size and configuration. There is a calcified meningioma arising along the dura of the medial temporal lobe on the left measuring 1.1 x 0.5 cm. There is no surrounding edema in this area. There is a second  calcified meningioma without surrounding edema arising from the right lateral temporal lobe region measuring 5 x 5 mm. No other evident mass. There is no intracranial hemorrhage, extra-axial fluid collection, or midline shift. There is slight small vessel disease in the centra semiovale bilaterally. Elsewhere gray-white compartments appear normal. No evident acute infarct. Vascular: No hyperdense vessels. There is calcification in each carotid siphon region. Skull: Bony calvarium appears intact. Sinuses/Orbits: There is mucosal thickening in several ethmoid air cells. Other visualized paranasal sinuses are clear. Orbits appear symmetric bilaterally. Other: Visualized mastoid air cells are clear. IMPRESSION: Slight periventricular small vessel disease. No acute infarct. No hemorrhage. Small calcified meningioma is noted, not felt to have  clinical significance. There are foci of arterial vascular calcification. There is mucosal thickening in several ethmoid air cells. Electronically Signed   By: Lowella Grip III M.D.   On: 07/20/2017 12:06    Procedures Procedures (including critical care time)  Medications Ordered in ED Medications  doxycycline (VIBRA-TABS) tablet 100 mg (has no administration in time range)     Initial Impression / Assessment and Plan / ED Course  I have reviewed the triage vital signs and the nursing notes.  Pertinent labs & imaging results that were available during my care of the patient were reviewed by me and considered in my medical decision making (see chart for details).     Patient seen and examined. Work-up reviewed.  Will treat with doxycycline given tick bite and rash.  Vital signs reviewed and are as follows: BP (!) 172/70 (BP Location: Left Arm)   Pulse 67   Temp 98.3 F (36.8 C) (Skin)   Resp 14   LMP 12/30/2000   SpO2 97%   Patient is not having any current chest pressure.  She has had troponin negative x2.  EKG shows normal sinus rhythm with no new changes.  Patient without any focal neurological deficits on exam and I have low suspicion for a stroke.  Head CT is negative.  Patient does not have any ongoing dizziness suggestive of posterior CVA.  She has had dizzy spells ongoing for some time, feel these can be followed up as an outpatient. Patient denies signs of stroke including: facial droop, slurred speech, aphasia, weakness/numbness in extremities, imbalance/trouble walking.  Patient does not have fever or other systemic symptoms of illness to suggest [facility] spotted fever.  EM-like rash on left thigh.  Lyme titers will be drawn and sent.  Patient will be placed on 2-week course of doxycycline and will follow up with PCP.  6:49 PM patient given doxycycline.  She will call her doctor tomorrow for follow-up appointment.  Patient counseled to return if they have  weakness in their arms or legs, slurred speech, trouble walking or talking, confusion, trouble with their balance, or if they have any other concerns. Patient verbalizes understanding and agrees with plan.   Patient was counseled to return with severe chest pain, especially if the pain is crushing or pressure-like and spreads to the arms, back, neck, or jaw, or if they have sweating, nausea, or shortness of breath with the pain. They were encouraged to call 911 with these symptoms.   They were also told to return if their chest pain gets worse and does not go away with rest, they have an attack of chest pain lasting longer than usual despite rest and treatment with the medications their caregiver has prescribed, if they wake from sleep with chest pain or shortness of breath, if they feel  dizzy or faint, if they have chest pain not typical of their usual pain, or if they have any other emergent concerns regarding their health.  The patient verbalized understanding and agreed.    Final Clinical Impressions(s) / ED Diagnoses   Final diagnoses:  Tick bite, initial encounter  Dizziness  Palpitations   Tick bite: Patient did not have a rash at the site of the bite however she did have an annular rash on her thigh.  It is possible for patients to have multiple EM rashes after a tick bite.  Even this as well as other vague symptoms of unclear etiology, will treat with doxycycline.  Lyme titers are pending.  Palpitations/CP: None at present. Pain is atypical. Troponin negative.  EKG nonischemic.  No indications for admission at this time.  Encourage PCP for recheck.  Dizziness: No focal neurological deficits on exam tonight.  Patient has had intermittent off-balance episodes that have not become worse.  These are chronic.  CT imaging of the head does not show any acute stroke or other problems.  Do not feel patient requires MRI at this time given her normal exam.  Comfortable with PCP follow-up and return  if symptoms recur or worsen.  ED Discharge Orders        Ordered    doxycycline (VIBRAMYCIN) 100 MG capsule  2 times daily     07/20/17 1849       Carlisle Cater, Hershal Coria 07/20/17 Vilma Prader, MD 07/20/17 2255

## 2017-07-21 ENCOUNTER — Telehealth: Payer: Self-pay | Admitting: Internal Medicine

## 2017-07-21 LAB — B. BURGDORFI ANTIBODIES: B burgdorferi Ab IgG+IgM: <0.91 {ISR} (ref 0.00–0.90)

## 2017-07-21 NOTE — Telephone Encounter (Signed)
LVM informing patient of MD response  

## 2017-07-21 NOTE — Telephone Encounter (Signed)
Copied from [place] [phone] [id number]. Topic: Quick Communication - Lab Results >> Jul 21, 2017 12:02 PM [name], [name] wrote: Patient called to check her lab results from her ED visit yesterday since patient left before results we in. Please call patient back, thanks.

## 2017-07-21 NOTE — Telephone Encounter (Signed)
Not all results are back but no signs of major problems. Can schedule visit to discuss in detail.

## 2017-08-03 ENCOUNTER — Other Ambulatory Visit (INDEPENDENT_AMBULATORY_CARE_PROVIDER_SITE_OTHER): Payer: Medicare HMO

## 2017-08-03 ENCOUNTER — Ambulatory Visit (INDEPENDENT_AMBULATORY_CARE_PROVIDER_SITE_OTHER): Payer: Medicare HMO | Admitting: Internal Medicine

## 2017-08-03 ENCOUNTER — Encounter: Payer: Self-pay | Admitting: Internal Medicine

## 2017-08-03 VITALS — BP 132/82 | HR 66 | Temp 97.9°F | Ht 63.0 in | Wt 225.0 lb

## 2017-08-03 DIAGNOSIS — Z794 Long term (current) use of insulin: Secondary | ICD-10-CM

## 2017-08-03 DIAGNOSIS — E1169 Type 2 diabetes mellitus with other specified complication: Secondary | ICD-10-CM | POA: Diagnosis not present

## 2017-08-03 DIAGNOSIS — E119 Type 2 diabetes mellitus without complications: Secondary | ICD-10-CM

## 2017-08-03 DIAGNOSIS — E785 Hyperlipidemia, unspecified: Secondary | ICD-10-CM | POA: Diagnosis not present

## 2017-08-03 DIAGNOSIS — M79605 Pain in left leg: Secondary | ICD-10-CM | POA: Diagnosis not present

## 2017-08-03 LAB — LIPID PANEL
CHOLESTEROL: 218 mg/dL — AB (ref 0–200)
HDL: 56.5 mg/dL (ref >39.00)
NONHDL: 161.95
TRIGLYCERIDES: 242 mg/dL — AB (ref 0.0–149.0)
Total CHOL/HDL Ratio: 4
VLDL: 48.4 mg/dL — ABNORMAL HIGH (ref 0.0–40.0)

## 2017-08-03 LAB — COMPREHENSIVE METABOLIC PANEL
ALT: 13 U/L (ref 0–35)
AST: 18 U/L (ref 0–37)
Albumin: 4.4 g/dL (ref 3.5–5.2)
Alkaline Phosphatase: 101 U/L (ref 39–117)
BILIRUBIN TOTAL: 0.5 mg/dL (ref 0.2–1.2)
BUN: 19 mg/dL (ref 6–23)
CHLORIDE: 99 meq/L (ref 96–112)
CO2: 29 meq/L (ref 19–32)
CREATININE: 0.8 mg/dL (ref 0.40–1.20)
Calcium: 9.9 mg/dL (ref 8.4–10.5)
GFR: 74.26 mL/min (ref >60.00)
GLUCOSE: 140 mg/dL — AB (ref 70–99)
Potassium: 3.8 mEq/L (ref 3.5–5.1)
Sodium: 136 mEq/L (ref 135–145)
Total Protein: 7.1 g/dL (ref 6.0–8.3)

## 2017-08-03 LAB — CBC
HEMATOCRIT: 43.1 % (ref 36.0–46.0)
Hemoglobin: 14.8 g/dL (ref 12.0–15.0)
MCHC: 34.2 g/dL (ref 30.0–36.0)
MCV: 88 fl (ref 78.0–100.0)
PLATELETS: 245 10*3/uL (ref 150.0–400.0)
RBC: 4.9 Mil/uL (ref 3.87–5.11)
RDW: 14 % (ref 11.5–15.5)
WBC: 8.5 10*3/uL (ref 4.0–10.5)

## 2017-08-03 LAB — HEMOGLOBIN A1C: Hgb A1c MFr Bld: 7.4 % — ABNORMAL HIGH (ref 4.6–6.5)

## 2017-08-03 LAB — LDL CHOLESTEROL, DIRECT: LDL DIRECT: 123 mg/dL

## 2017-08-03 NOTE — Progress Notes (Signed)
   Subjective:    Patient ID: [name], female    DOB: [birth year], 77 y.o.   MRN: [id number]  HPI The patient is a 77 YO female coming in for ER follow up (in for vague systemic symptoms after tick bite, tick on for about 5 days, rash on her leg but not at the site of the bite, treated with doxycycline and testing ordered for lyme disease, cardiac testing done and normal, lyme testing returned normal as well). She is feeling some better. Slightly more energy, less nausea. Still faint rash on her leg. The tick bite area is healing but still red. She denies new tick exposure since last time. Has 1 more dose of doxycycline. Denies side effects from this medication. She is having less sinus congestion at this time which is a positive. She is also needing follow up of her diabetes (diet controlled at this time, diet is stable, activity is low recently, goal HgA1c 8). No other new concerns.   Review of Systems  Constitutional: Positive for activity change and fatigue. Negative for diaphoresis, fever and unexpected weight change.       Improving  HENT: Negative.   Eyes: Negative.   Respiratory: Negative for cough, chest tightness and shortness of breath.   Cardiovascular: Negative for chest pain, palpitations and leg swelling.  Gastrointestinal: Negative for abdominal distention, abdominal pain, constipation, diarrhea, nausea and vomiting.  Musculoskeletal: Negative.   Skin: Positive for rash and wound.  Neurological: Negative.   Psychiatric/Behavioral: Negative.       Objective:   Physical Exam  Constitutional: She is oriented to person, place, and time. She appears well-developed and well-nourished.  HENT:  Head: Normocephalic and atraumatic.  Eyes: EOM are normal.  Neck: Normal range of motion.  Cardiovascular: Normal rate and regular rhythm.  Pulmonary/Chest: Effort normal and breath sounds normal. No respiratory distress. She has no wheezes. She has no rales.  Abdominal: Soft. Bowel  sounds are normal. She exhibits no distension. There is no tenderness. There is no rebound.  Musculoskeletal: She exhibits no edema.  Neurological: She is alert and oriented to person, place, and time. Coordination normal.  Skin: Skin is warm and dry. Rash noted.  Faint red circular rash on the upper left leg, punctate scab on the lower leg at site of prior tick bite.   Psychiatric: She has a normal mood and affect.   Vitals:   08/03/17 0959  BP: 132/82  Pulse: 66  Temp: 97.9 F (36.6 C)  TempSrc: Oral  SpO2: 98%  Weight: 225 lb (102.1 kg)  Height: 5\' 3"  (1.6 m)      Assessment & Plan:

## 2017-08-03 NOTE — Patient Instructions (Signed)
We will check the labs today. 

## 2017-08-04 NOTE — Telephone Encounter (Signed)
Copied from [place] [id number]. Topic: Quick Communication - Lab Results >> Aug 04, 2017 10:26 AM Scherrie Gerlach wrote: Pt would like results of A1C done yesterday

## 2017-08-04 NOTE — Telephone Encounter (Signed)
We will review them and let her know. This can take 1-2 days.

## 2017-08-05 ENCOUNTER — Encounter: Payer: Self-pay | Admitting: Internal Medicine

## 2017-08-05 DIAGNOSIS — M79606 Pain in leg, unspecified: Secondary | ICD-10-CM | POA: Insufficient documentation

## 2017-08-05 DIAGNOSIS — M25552 Pain in left hip: Secondary | ICD-10-CM | POA: Insufficient documentation

## 2017-08-05 NOTE — Assessment & Plan Note (Signed)
From tick bite. She will finish the doxycyline although her lyme testing shows no exposure. We talked about how most ticks in the area do not have lyme disease. We talked about tick management with inspection after outdoor exposure and removal of ticks as soon as they are noticed. The longer a tick is on the body the more likely an illness could be passed on to human from tick.

## 2017-08-05 NOTE — Assessment & Plan Note (Signed)
Previously on statin, not currently. Needs new lipid panel. Goal LDL <100.

## 2017-08-05 NOTE — Telephone Encounter (Signed)
Resulted through result notes

## 2017-08-05 NOTE — Assessment & Plan Note (Signed)
Diet controlled, on ARB. Not on statin. Checking lipid panel, HgA1c. Adjust as needed. Complicated by hyperlipidemia.

## 2017-08-18 ENCOUNTER — Other Ambulatory Visit: Payer: Self-pay | Admitting: Internal Medicine

## 2017-08-19 ENCOUNTER — Telehealth: Payer: Self-pay | Admitting: Internal Medicine

## 2017-08-19 NOTE — Telephone Encounter (Signed)
Copied from [place] [id number]. Topic: Quick Communication - See Telephone Encounter >> Aug 19, 2017  9:12 AM Antonieta Iba C wrote: CRM for notification. See Telephone encounter for: 08/19/17.  Pt called in, pharmacy received Rx for the meter, pt also need a Rx for the ACCU-CHEK AVIVA PLUS test strips and also need the Lancets.   Pharmacy: [id number] - [place], [place]

## 2017-08-19 NOTE — Telephone Encounter (Signed)
Pt needs the new RX for accu-check aviva meter, strips, and lancets to go to [facility] because it will not cost her anything.  [place], [place] [phone] (Phone) [phone] (Fax)

## 2017-08-22 MED ORDER — ACCU-CHEK SOFT TOUCH LANCETS MISC: 1 refills | Status: DC

## 2017-08-22 MED ORDER — ACCU-CHEK AVIVA PLUS W/DEVICE KIT: PACK | 0 refills | Status: DC

## 2017-08-22 MED ORDER — GLUCOSE BLOOD VI STRP: ORAL_STRIP | 1 refills | Status: DC

## 2017-08-22 NOTE — Telephone Encounter (Signed)
Reviewed chart pt is up-to-date sent refills to [facility]lmb  

## 2017-09-07 ENCOUNTER — Telehealth: Payer: Self-pay | Admitting: *Deleted

## 2017-09-07 NOTE — Telephone Encounter (Signed)
Pt Rs for 130 pm Thursday 7-18 in room 50

## 2017-09-07 NOTE — Telephone Encounter (Signed)
Pt no showed her 1030 am Pv today called pt that identifies her by first and last name to return call by 5 pm to reschedule to avoid her 09-21-17 colon being cancelled .

## 2017-09-08 ENCOUNTER — Other Ambulatory Visit: Payer: Self-pay

## 2017-09-08 ENCOUNTER — Ambulatory Visit (AMBULATORY_SURGERY_CENTER): Payer: Self-pay | Admitting: *Deleted

## 2017-09-08 VITALS — Ht 62.0 in | Wt 227.2 lb

## 2017-09-08 DIAGNOSIS — Z85038 Personal history of other malignant neoplasm of large intestine: Secondary | ICD-10-CM

## 2017-09-08 MED ORDER — PEG 3350-KCL-NA BICARB-NACL 420 G PO SOLR: 4000.0000 mL | Freq: Once | ORAL | 0 refills | Status: AC

## 2017-09-08 NOTE — Progress Notes (Signed)
Patient denies any allergies to egg or soy products. Patient denies complications with anesthesia/sedation.  Patient denies oxygen use at home and denies diet medications.  Patient denies information on colonoscopy.  Patient does not use CPAP.  Patient states that her sleep apnea was mild and did not need to use a CPAP.

## 2017-09-09 ENCOUNTER — Encounter: Payer: Self-pay | Admitting: Gastroenterology

## 2017-09-21 ENCOUNTER — Ambulatory Visit (AMBULATORY_SURGERY_CENTER): Payer: Medicare HMO | Admitting: Gastroenterology

## 2017-09-21 ENCOUNTER — Encounter: Payer: Self-pay | Admitting: Gastroenterology

## 2017-09-21 VITALS — BP 129/63 | HR 64 | Temp 99.1°F | Resp 17 | Ht 63.0 in | Wt 225.0 lb

## 2017-09-21 DIAGNOSIS — Z85038 Personal history of other malignant neoplasm of large intestine: Secondary | ICD-10-CM

## 2017-09-21 DIAGNOSIS — D123 Benign neoplasm of transverse colon: Secondary | ICD-10-CM

## 2017-09-21 DIAGNOSIS — G4733 Obstructive sleep apnea (adult) (pediatric): Secondary | ICD-10-CM | POA: Diagnosis not present

## 2017-09-21 DIAGNOSIS — I4891 Unspecified atrial fibrillation: Secondary | ICD-10-CM | POA: Diagnosis not present

## 2017-09-21 DIAGNOSIS — I10 Essential (primary) hypertension: Secondary | ICD-10-CM | POA: Diagnosis not present

## 2017-09-21 DIAGNOSIS — E119 Type 2 diabetes mellitus without complications: Secondary | ICD-10-CM | POA: Diagnosis not present

## 2017-09-21 MED ORDER — SODIUM CHLORIDE 0.9 % IV SOLN: 500.0000 mL | Freq: Once | INTRAVENOUS | Status: DC

## 2017-09-21 NOTE — Op Note (Signed)
[facility] Patient Name: [name] Procedure Date: 09/21/2017 9:22 AM MRN: [id number] Endoscopist: Milus Banister , MD Age: 77 Referring MD:  Date of Birth: 04/12/1940 Gender: Female Account #: [id number] Procedure:                Colonoscopy Indications:              High risk colon cancer surveillance: Personal                            history of colon cancer; T2N1 right sided colon                            cancer diagnosed 2007; repeat colonoscopy 2010                            found single adenoma. Colonoscopy 2014 no polyps Medicines:                Monitored Anesthesia Care Procedure:                Pre-Anesthesia Assessment:                           - Prior to the procedure, a History and Physical                            was performed, and patient medications and                            allergies were reviewed. The patient's tolerance of                            previous anesthesia was also reviewed. The risks                            and benefits of the procedure and the sedation                            options and risks were discussed with the patient.                            All questions were answered, and informed consent                            was obtained. Prior Anticoagulants: The patient has                            taken no previous anticoagulant or antiplatelet                            agents. ASA Grade Assessment: II - A patient with                            mild systemic disease. After reviewing the risks  and benefits, the patient was deemed in                            satisfactory condition to undergo the procedure.                           After obtaining informed consent, the colonoscope                            was passed under direct vision. Throughout the                            procedure, the patient's blood pressure, pulse, and                            oxygen saturations  were monitored continuously. The                            Colonoscope was introduced through the anus and                            advanced to the the ileocolonic anastomosis. The                            colonoscopy was performed without difficulty. The                            patient tolerated the procedure well. The quality                            of the bowel preparation was good. The rectum was                            photographed. Scope In: 9:29:39 AM Scope Out: 9:37:54 AM Scope Withdrawal Time: 0 hours 7 minutes 1 second  Total Procedure Duration: 0 hours 8 minutes 15 seconds  Findings:                 Two sessile polyps were found in the transverse                            colon. The polyps were 3 to 4 mm in size. These                            polyps were removed with a cold snare. Resection                            and retrieval were complete.                           Normal ileocolonic anastomosis from 2007 right                            hemicolectomy.  The exam was otherwise without abnormality on                            direct and retroflexion views. Complications:            No immediate complications. Estimated blood loss:                            None. Estimated Blood Loss:     Estimated blood loss: none. Impression:               - Two 3 to 4 mm polyps in the transverse colon,                            removed with a cold snare. Resected and retrieved.                           - Normal ileocolononic anastomosis from 2007 right                            hemicolectomy.                           - The examination was otherwise normal on direct                            and retroflexion views. Recommendation:           - Patient has a contact number available for                            emergencies. The signs and symptoms of potential                            delayed complications were discussed with the                             patient. Return to normal activities tomorrow.                            Written discharge instructions were provided to the                            patient.                           - Resume previous diet.                           - Continue present medications.                           You will receive a letter within 2-3 weeks with the                            pathology results and my final recommendations.  If the polyp(s) is proven to be 'pre-cancerous' on                            pathology, you will need repeat colonoscopy in 5                            years. Milus Banister, MD 09/21/2017 9:41:08 AM This report has been signed electronically.

## 2017-09-21 NOTE — Progress Notes (Signed)
Called to room to assist during endoscopic procedure.  Patient ID and intended procedure confirmed with present staff. Received instructions for my participation in the procedure from the performing physician.  

## 2017-09-21 NOTE — Progress Notes (Signed)
Report to PACU, RN, vss, BBS= Clear.  

## 2017-09-21 NOTE — Progress Notes (Signed)
Pt's states no medical or surgical changes since previsit or office visit. 

## 2017-09-21 NOTE — Patient Instructions (Signed)
Thank you for allowing [place] to care for you today.  Await pathology results by mail, 2-3 weeks.  Handouts given for polyps.  Resume previous diet and medications.    YOU HAD AN ENDOSCOPIC PROCEDURE TODAY AT [facility]:   Refer to the procedure report that was given to you for any specific questions about what was found during the examination.  If the procedure report does not answer your questions, please call your gastroenterologist to clarify.  If you requested that your care partner not be given the details of your procedure findings, then the procedure report has been included in a sealed envelope for you to review at your convenience later.  YOU SHOULD EXPECT: Some feelings of bloating in the abdomen. Passage of more gas than usual.  Walking can help get rid of the air that was put into your GI tract during the procedure and reduce the bloating. If you had a lower endoscopy (such as a colonoscopy or flexible sigmoidoscopy) you may notice spotting of blood in your stool or on the toilet paper. If you underwent a bowel prep for your procedure, you may not have a normal bowel movement for a few days.  Please Note:  You might notice some irritation and congestion in your nose or some drainage.  This is from the oxygen used during your procedure.  There is no need for concern and it should clear up in a day or so.  SYMPTOMS TO REPORT IMMEDIATELY:   Following lower endoscopy (colonoscopy or flexible sigmoidoscopy):  Excessive amounts of blood in the stool  Significant tenderness or worsening of abdominal pains  Swelling of the abdomen that is new, acute  Fever of 100F or higher    For urgent or emergent issues, a gastroenterologist can be reached at any hour by calling [phone].   DIET:  We do recommend a small meal at first, but then you may proceed to your regular diet.  Drink plenty of fluids but you should avoid alcoholic beverages for 24 hours.  ACTIVITY:   You should plan to take it easy for the rest of today and you should NOT DRIVE or use heavy machinery until tomorrow (because of the sedation medicines used during the test).    FOLLOW UP: Our staff will call the number listed on your records the next business day following your procedure to check on you and address any questions or concerns that you may have regarding the information given to you following your procedure. If we do not reach you, we will leave a message.  However, if you are feeling well and you are not experiencing any problems, there is no need to return our call.  We will assume that you have returned to your regular daily activities without incident.  If any biopsies were taken you will be contacted by phone or by letter within the next 1-3 weeks.  Please call us at [phone] if you have not heard about the biopsies in 3 weeks.    SIGNATURES/CONFIDENTIALITY: You and/or your care partner have signed paperwork which will be entered into your electronic medical record.  These signatures attest to the fact that that the information above on your After Visit Summary has been reviewed and is understood.  Full responsibility of the confidentiality of this discharge information lies with you and/or your care-partner.

## 2017-09-22 ENCOUNTER — Telehealth: Payer: Self-pay

## 2017-09-22 NOTE — Telephone Encounter (Signed)
  Follow up Call-  Call back number 09/21/2017  Post procedure Call Back phone  # [phone]  Permission to leave phone message Yes  Some recent data might be hidden     Patient questions:  Do you have a fever, pain , or abdominal swelling? No. Pain Score  0 *  Have you tolerated food without any problems? Yes.    Have you been able to return to your normal activities? Yes.    Do you have any questions about your discharge instructions: Diet   No. Medications  No. Follow up visit  No.  Do you have questions or concerns about your Care? No.  Actions: * If pain score is 4 or above: No action needed, pain <4.

## 2017-09-27 ENCOUNTER — Encounter: Payer: Self-pay | Admitting: Gastroenterology

## 2017-10-29 ENCOUNTER — Other Ambulatory Visit: Payer: Self-pay | Admitting: Internal Medicine

## 2017-10-31 NOTE — Telephone Encounter (Signed)
Control database checked last refill: 07/15/2017  LOV: 08/03/2017 [id number]

## 2017-12-29 ENCOUNTER — Other Ambulatory Visit: Payer: Self-pay | Admitting: Internal Medicine

## 2017-12-30 DIAGNOSIS — H04123 Dry eye syndrome of bilateral lacrimal glands: Secondary | ICD-10-CM | POA: Diagnosis not present

## 2017-12-30 DIAGNOSIS — E119 Type 2 diabetes mellitus without complications: Secondary | ICD-10-CM | POA: Diagnosis not present

## 2017-12-30 DIAGNOSIS — H5203 Hypermetropia, bilateral: Secondary | ICD-10-CM | POA: Diagnosis not present

## 2017-12-30 LAB — HM DIABETES EYE EXAM

## 2018-01-02 ENCOUNTER — Encounter: Payer: Self-pay | Admitting: Internal Medicine

## 2018-01-02 NOTE — Progress Notes (Signed)
Abstracted and sent to scan  

## 2018-01-13 ENCOUNTER — Other Ambulatory Visit: Payer: Self-pay

## 2018-01-13 MED ORDER — LOSARTAN POTASSIUM 100 MG PO TABS: 100.0000 mg | ORAL_TABLET | Freq: Every day | ORAL | 1 refills | Status: DC

## 2018-01-20 ENCOUNTER — Telehealth: Payer: Self-pay | Admitting: Internal Medicine

## 2018-01-20 NOTE — Telephone Encounter (Signed)
Patient dropped off disability placard  Call patient to pick up at: [phone]  Disposition: Dr's box

## 2018-01-23 ENCOUNTER — Other Ambulatory Visit: Payer: Self-pay | Admitting: Internal Medicine

## 2018-01-23 NOTE — Telephone Encounter (Signed)
Placed in MD folder to sign

## 2018-01-23 NOTE — Telephone Encounter (Signed)
noted 

## 2018-01-25 NOTE — Telephone Encounter (Signed)
Patient informed that form is filled out and placed up front for pick up.

## 2018-03-15 ENCOUNTER — Other Ambulatory Visit: Payer: Self-pay | Admitting: Internal Medicine

## 2018-03-16 NOTE — Telephone Encounter (Signed)
Please advise per Dr. Nathanial Millman absence. Thank you  Called pharmacy last refill 11/21/2017 30tabs LOV: 08/03/2017 [id number]

## 2018-04-11 ENCOUNTER — Other Ambulatory Visit: Payer: Self-pay | Admitting: Internal Medicine

## 2018-04-11 MED ORDER — ALBUTEROL SULFATE HFA 108 (90 BASE) MCG/ACT IN AERS: 2.0000 | INHALATION_SPRAY | Freq: Four times a day (QID) | RESPIRATORY_TRACT | 1 refills | Status: DC | PRN

## 2018-04-11 NOTE — Telephone Encounter (Signed)
Requested medication (s) are due for refill today: yes  Requested medication (s) are on the active medication list: yes  Last refill:  01/28/17 1 inhaler no RF  Future visit scheduled: no  Notes to clinic:  Pt stated that with her h/o asthma and the weather changes she needs her inhaler on an as needed basis. No acute symptoms or SOB. Please send to [facility].   Requested Prescriptions  Pending Prescriptions Disp Refills   albuterol (PROVENTIL HFA;VENTOLIN HFA) 108 (90 Base) MCG/ACT inhaler 1 Inhaler 0    Sig: Inhale 2 puffs into the lungs every 6 (six) hours as needed for wheezing or shortness of breath.     Pulmonology:  Beta Agonists Failed - 04/11/2018  9:37 AM      Failed - One inhaler should last at least one month. If the patient is requesting refills earlier, contact the patient to check for uncontrolled symptoms.      Passed - Valid encounter within last 12 months    Recent Outpatient Visits          8 months ago Type 2 diabetes mellitus with hyperlipidemia ([place])   [place] [facility], Elizabeth A, MD   1 year ago Type 2 diabetes mellitus without complication, without long-term current use of insulin ([place])   [place], Elizabeth A, MD   1 year ago Mild intermittent asthma with acute exacerbation   [place], Elizabeth A, MD   1 year ago Type 2 diabetes mellitus without complication, without long-term current use of insulin ([place])   [facility] -Chuck Hint, MD   2 years ago Chronic bilateral low back pain with right-sided sciatica   [facility] -Chuck Hint, MD

## 2018-04-11 NOTE — Telephone Encounter (Signed)
Copied from [place] [id number]. Topic: Quick Communication - Rx Refill/Question >> Apr 11, 2018  9:16 AM Alfredia Ferguson R wrote: Medication: albuterol (PROVENTIL HFA;VENTOLIN HFA) 108 (90 Base) MCG/ACT inhaler  Has the patient contacted their pharmacy? Yes  Preferred Pharmacy (with phone number or street name): [facility], [place] [phone] (Phone) [phone] (Fax)    Agent: Please be advised that RX refills may take up to 3 business days. We ask that you follow-up with your pharmacy.

## 2018-04-13 DIAGNOSIS — H04123 Dry eye syndrome of bilateral lacrimal glands: Secondary | ICD-10-CM | POA: Diagnosis not present

## 2018-04-13 DIAGNOSIS — H43812 Vitreous degeneration, left eye: Secondary | ICD-10-CM | POA: Diagnosis not present

## 2018-04-14 ENCOUNTER — Encounter: Payer: Self-pay | Admitting: Internal Medicine

## 2018-04-14 ENCOUNTER — Ambulatory Visit (INDEPENDENT_AMBULATORY_CARE_PROVIDER_SITE_OTHER): Payer: Medicare HMO | Admitting: Internal Medicine

## 2018-04-14 DIAGNOSIS — N644 Mastodynia: Secondary | ICD-10-CM | POA: Insufficient documentation

## 2018-04-14 MED ORDER — SULFAMETHOXAZOLE-TRIMETHOPRIM 800-160 MG PO TABS: 1.0000 | ORAL_TABLET | Freq: Two times a day (BID) | ORAL | 0 refills | Status: DC

## 2018-04-14 MED ORDER — TRAMADOL HCL 50 MG PO TABS: 50.0000 mg | ORAL_TABLET | Freq: Every day | ORAL | 0 refills | Status: DC | PRN

## 2018-04-14 NOTE — Assessment & Plan Note (Signed)
Rx for bactrim for the abscess. It is draining but still subcutaneous infection.

## 2018-04-14 NOTE — Patient Instructions (Signed)
We have sent in the refill of the tramadol and have sent in an antibiotic for the breast called bactrim to take 1 pill twice a day for 5 days.

## 2018-04-14 NOTE — Progress Notes (Signed)
   Subjective:   Patient ID: [name], female    DOB: 13-Feb-1941, 78 y.o.   MRN: [id number]  HPI The patient is a 78 y.o. female coming in for abscess symptoms on her right breast. Started 1-2 days ago. Main symptoms are: pain, redness, swelling. Denies fevers or chills. Overall it is stable and did drain out stuff last night and is still draining out some. Has tried nothing.   Review of Systems  Constitutional: Negative.   HENT: Negative.   Eyes: Negative.   Respiratory: Negative for cough, chest tightness and shortness of breath.   Cardiovascular: Negative for chest pain, palpitations and leg swelling.  Gastrointestinal: Negative for abdominal distention, abdominal pain, constipation, diarrhea, nausea and vomiting.  Musculoskeletal: Negative.   Skin: Positive for rash and wound.  Neurological: Negative.   Psychiatric/Behavioral: Negative.     Objective:  Physical Exam Constitutional:      Appearance: She is well-developed. She is obese.  HENT:     Head: Normocephalic and atraumatic.  Neck:     Musculoskeletal: Normal range of motion.  Cardiovascular:     Rate and Rhythm: Normal rate and regular rhythm.  Pulmonary:     Effort: Pulmonary effort is normal. No respiratory distress.     Breath sounds: Normal breath sounds. No wheezing or rales.  Abdominal:     General: Bowel sounds are normal. There is no distension.     Palpations: Abdomen is soft.     Tenderness: There is no abdominal tenderness. There is no rebound.  Musculoskeletal:        General: Swelling and tenderness present.     Comments: Right breast 11 o'clock with abscess with is draining white material with surrounding erythema, tenderness, some hardness underneath the abscess which is not amenable to I and D.   Skin:    General: Skin is warm and dry.  Neurological:     Mental Status: She is alert and oriented to person, place, and time.     Coordination: Coordination normal.     Vitals:   04/14/18  1455  BP: (!) 150/70  Pulse: 66  Temp: 98.2 F (36.8 C)  TempSrc: Oral  SpO2: 96%  Weight: 216 lb (98 kg)  Height: 5\' 3"  (1.6 m)    Assessment & Plan:

## 2018-04-20 ENCOUNTER — Ambulatory Visit (INDEPENDENT_AMBULATORY_CARE_PROVIDER_SITE_OTHER): Payer: Medicare HMO | Admitting: Internal Medicine

## 2018-04-20 ENCOUNTER — Encounter: Payer: Self-pay | Admitting: Internal Medicine

## 2018-04-20 DIAGNOSIS — B9789 Other viral agents as the cause of diseases classified elsewhere: Secondary | ICD-10-CM | POA: Diagnosis not present

## 2018-04-20 DIAGNOSIS — J069 Acute upper respiratory infection, unspecified: Secondary | ICD-10-CM | POA: Diagnosis not present

## 2018-04-20 DIAGNOSIS — I480 Paroxysmal atrial fibrillation: Secondary | ICD-10-CM

## 2018-04-20 DIAGNOSIS — J4520 Mild intermittent asthma, uncomplicated: Secondary | ICD-10-CM

## 2018-04-20 MED ORDER — BENZONATATE 200 MG PO CAPS: 200.0000 mg | ORAL_CAPSULE | Freq: Three times a day (TID) | ORAL | 0 refills | Status: DC | PRN

## 2018-04-20 NOTE — Assessment & Plan Note (Signed)
Advised she cannot take cold medications that are not safe for high BP or any zyrtec-d as the d component can cause her a fib to flare up. Still taking diltiazem daily and cannot tolerate beta blockers. HR at goal today.

## 2018-04-20 NOTE — Assessment & Plan Note (Signed)
No flare today. Viral URI. Can use albuterol prn.

## 2018-04-20 NOTE — Patient Instructions (Addendum)
I would recommend to take zyrtec (cetirizine) daily to help with drainage.   We have sent in tessalon perles for cough that you can take 1 pill 3 times per day.   The average virus can last 7-10 days.   Delsym and Chlorocedin are both safe for you to take over the counter as well.   Saline (salt water) eye drops can help with itching or burning.

## 2018-04-20 NOTE — Assessment & Plan Note (Signed)
Advised to start zyrtec daily and can use delsym or chlorocedin for cold symptoms. Rx for tessalon perles.

## 2018-04-20 NOTE — Progress Notes (Signed)
   Subjective:   Patient ID: [name], female    DOB: [birth year], 77 y.o.   MRN: [id number]  HPI The patient is a 78 y.o. female coming in for cold symptoms. Started about 3-4 days ago. Main symptoms are: nose drainage, congestion, cough with yellow sputum. Denies SOB or chest pains. Overall it is stable with some ups and downs along the week. Has tried nothing for this. Has concurrent A fib and asthma and was not sure what was safe to take. Denies wheezing at home. Using albuterol but not more than usual.   Review of Systems  Constitutional: Positive for activity change and appetite change. Negative for chills, fatigue, fever and unexpected weight change.  HENT: Positive for congestion, postnasal drip, rhinorrhea and sinus pressure. Negative for ear discharge, ear pain, sinus pain, sneezing, sore throat, tinnitus, trouble swallowing and voice change.   Eyes: Negative.   Respiratory: Positive for cough. Negative for chest tightness, shortness of breath and wheezing.   Cardiovascular: Negative.   Gastrointestinal: Negative.   Musculoskeletal: Positive for myalgias.  Neurological: Negative.     Objective:  Physical Exam Constitutional:      Appearance: She is well-developed.  HENT:     Head: Normocephalic and atraumatic.     Comments: Oropharynx with redness and clear drainage, nose with swollen turbinates, TMs normal bilaterally.  Neck:     Musculoskeletal: Normal range of motion.     Thyroid: No thyromegaly.  Cardiovascular:     Rate and Rhythm: Normal rate.  Pulmonary:     Effort: Pulmonary effort is normal. No respiratory distress.     Breath sounds: Normal breath sounds. No wheezing or rales.  Abdominal:     Palpations: Abdomen is soft.  Musculoskeletal:        General: Tenderness present.  Lymphadenopathy:     Cervical: No cervical adenopathy.  Skin:    General: Skin is warm and dry.  Neurological:     Mental Status: She is alert and oriented to person, place, and  time.     Vitals:   04/20/18 0856  BP: (!) 142/60  Pulse: 71  Temp: 97.7 F (36.5 C)  TempSrc: Oral  SpO2: 97%  Weight: 216 lb (98 kg)  Height: 5\' 3"  (1.6 m)    Assessment & Plan:

## 2018-05-18 DIAGNOSIS — H1859 Other hereditary corneal dystrophies: Secondary | ICD-10-CM | POA: Diagnosis not present

## 2018-05-22 DIAGNOSIS — H1859 Other hereditary corneal dystrophies: Secondary | ICD-10-CM | POA: Diagnosis not present

## 2018-06-02 ENCOUNTER — Other Ambulatory Visit: Payer: Self-pay | Admitting: Internal Medicine

## 2018-06-05 DIAGNOSIS — H1859 Other hereditary corneal dystrophies: Secondary | ICD-10-CM | POA: Diagnosis not present

## 2018-06-05 DIAGNOSIS — H1820 Unspecified corneal edema: Secondary | ICD-10-CM | POA: Diagnosis not present

## 2018-08-03 ENCOUNTER — Other Ambulatory Visit: Payer: Self-pay | Admitting: Internal Medicine

## 2018-08-07 DIAGNOSIS — H04123 Dry eye syndrome of bilateral lacrimal glands: Secondary | ICD-10-CM | POA: Diagnosis not present

## 2018-08-10 ENCOUNTER — Other Ambulatory Visit: Payer: Self-pay

## 2018-08-10 ENCOUNTER — Ambulatory Visit (INDEPENDENT_AMBULATORY_CARE_PROVIDER_SITE_OTHER): Payer: Medicare HMO | Admitting: Internal Medicine

## 2018-08-10 ENCOUNTER — Other Ambulatory Visit (INDEPENDENT_AMBULATORY_CARE_PROVIDER_SITE_OTHER): Payer: Medicare HMO

## 2018-08-10 ENCOUNTER — Encounter: Payer: Self-pay | Admitting: Internal Medicine

## 2018-08-10 VITALS — BP 130/90 | HR 74 | Temp 98.2°F | Ht 63.0 in | Wt 217.0 lb

## 2018-08-10 DIAGNOSIS — N281 Cyst of kidney, acquired: Secondary | ICD-10-CM | POA: Diagnosis not present

## 2018-08-10 DIAGNOSIS — R350 Frequency of micturition: Secondary | ICD-10-CM | POA: Insufficient documentation

## 2018-08-10 DIAGNOSIS — E785 Hyperlipidemia, unspecified: Secondary | ICD-10-CM

## 2018-08-10 DIAGNOSIS — E1169 Type 2 diabetes mellitus with other specified complication: Secondary | ICD-10-CM

## 2018-08-10 LAB — POCT URINALYSIS DIPSTICK
Bilirubin, UA: NEGATIVE
Blood, UA: NEGATIVE
Glucose, UA: NEGATIVE
Ketones, UA: NEGATIVE
Leukocytes, UA: NEGATIVE
Nitrite, UA: NEGATIVE
Protein, UA: POSITIVE — AB
Spec Grav, UA: 1.025 (ref 1.010–1.025)
Urobilinogen, UA: 0.2 E.U./dL
pH, UA: 6 (ref 5.0–8.0)

## 2018-08-10 LAB — POCT UA - MICROALBUMIN
Albumin/Creatinine Ratio, Urine, POC: >300
Creatinine, POC: 10 mg/dL
Microalbumin Ur, POC: 150 mg/L

## 2018-08-10 LAB — COMPREHENSIVE METABOLIC PANEL
ALT: 10 U/L (ref 0–35)
AST: 13 U/L (ref 0–37)
Albumin: 4.3 g/dL (ref 3.5–5.2)
Alkaline Phosphatase: 90 U/L (ref 39–117)
BUN: 26 mg/dL — ABNORMAL HIGH (ref 6–23)
CO2: 27 mEq/L (ref 19–32)
Calcium: 9.5 mg/dL (ref 8.4–10.5)
Chloride: 103 mEq/L (ref 96–112)
Creatinine, Ser: 1.03 mg/dL (ref 0.40–1.20)
GFR: 52.05 mL/min — ABNORMAL LOW (ref >60.00)
Glucose, Bld: 106 mg/dL — ABNORMAL HIGH (ref 70–99)
Potassium: 4.1 mEq/L (ref 3.5–5.1)
Sodium: 139 mEq/L (ref 135–145)
Total Bilirubin: 0.5 mg/dL (ref 0.2–1.2)
Total Protein: 6.6 g/dL (ref 6.0–8.3)

## 2018-08-10 LAB — LIPID PANEL
Cholesterol: 218 mg/dL — ABNORMAL HIGH (ref 0–200)
HDL: 65.4 mg/dL (ref >39.00)
LDL Cholesterol: 120 mg/dL — ABNORMAL HIGH (ref 0–99)
NonHDL: 152.39
Total CHOL/HDL Ratio: 3
Triglycerides: 162 mg/dL — ABNORMAL HIGH (ref 0.0–149.0)
VLDL: 32.4 mg/dL (ref 0.0–40.0)

## 2018-08-10 LAB — CBC
HCT: 42.2 % (ref 36.0–46.0)
Hemoglobin: 14.4 g/dL (ref 12.0–15.0)
MCHC: 34.2 g/dL (ref 30.0–36.0)
MCV: 88.6 fl (ref 78.0–100.0)
Platelets: 233 10*3/uL (ref 150.0–400.0)
RBC: 4.76 Mil/uL (ref 3.87–5.11)
RDW: 13.6 % (ref 11.5–15.5)
WBC: 8.9 10*3/uL (ref 4.0–10.5)

## 2018-08-10 LAB — HEMOGLOBIN A1C: Hgb A1c MFr Bld: 7.1 % — ABNORMAL HIGH (ref 4.6–6.5)

## 2018-08-10 NOTE — Assessment & Plan Note (Signed)
U/A done in the office without signs of infection. Could be related to aging and past history of 3 children. Given her dry eyes myrbetriq would be the only safe option for treatment for her.

## 2018-08-10 NOTE — Progress Notes (Signed)
   Subjective:   Patient ID: [name], female    DOB: [birth year], 78 y.o.   MRN: [id number]  HPI The patient is a 78 y.o. female coming in for UTI symptoms. Started 5-6 months ago. Main symptoms are: frequency in going to the bathroom. Denies pain with urination. Has stopped taking lasix which did not help. Is getting up many times at night to use the bathroom also. Denies fevers or chills or abdominal pain. Denies nausea or vomiting. Overall it is stable in the last several months. Has tried stopping lasix which did not help. She is very concerned about this due to family history of bladder cancer. She has had cancer before in colon and she is very worried about this. Denies blood in urine.  Also needs follow up of diabetes (off meds currently, taking ARB, not on statin, diet has been poor recently with the pandemic, not exercising lately)  Review of Systems  Constitutional: Negative.   HENT: Negative.   Eyes: Negative.   Respiratory: Negative for cough, chest tightness and shortness of breath.   Cardiovascular: Negative for chest pain, palpitations and leg swelling.  Gastrointestinal: Negative for abdominal distention, abdominal pain, constipation, diarrhea, nausea and vomiting.  Genitourinary: Positive for enuresis, frequency and urgency. Negative for decreased urine volume, difficulty urinating and dysuria.  Musculoskeletal: Negative.   Skin: Negative.   Neurological: Negative.   Psychiatric/Behavioral: Negative.     Objective:  Physical Exam Constitutional:      Appearance: She is well-developed. She is obese.  HENT:     Head: Normocephalic and atraumatic.  Neck:     Musculoskeletal: Normal range of motion.  Cardiovascular:     Rate and Rhythm: Normal rate and regular rhythm.  Pulmonary:     Effort: Pulmonary effort is normal. No respiratory distress.     Breath sounds: Normal breath sounds. No wheezing or rales.  Abdominal:     General: Bowel sounds are normal. There is  no distension.     Palpations: Abdomen is soft.     Tenderness: There is no abdominal tenderness. There is no rebound.  Skin:    General: Skin is warm and dry.  Neurological:     Mental Status: She is alert and oriented to person, place, and time.     Coordination: Coordination normal.     Vitals:   08/10/18 1430  BP: 130/90  Pulse: 74  Temp: 98.2 F (36.8 C)  TempSrc: Oral  SpO2: 98%  Weight: 217 lb (98.4 kg)  Height: 5\' 3"  (1.6 m)    Assessment & Plan:

## 2018-08-10 NOTE — Assessment & Plan Note (Signed)
Checking lipid panel and djust as needed. She is not currently on statin due to declining this.

## 2018-08-10 NOTE — Assessment & Plan Note (Signed)
She is concerned especially with the urinary symptoms and family history of bladder cancer. Ordered US renal.

## 2018-08-10 NOTE — Patient Instructions (Signed)
We will get an ultrasound of the kidneys and bladder as well as the labs today.

## 2018-08-10 NOTE — Assessment & Plan Note (Signed)
Microalbumin to creatinine ratio is increased. She is on max dose losartan. She has declined statin in the past. Not on meds for diabetes. Checking HgA1c and adjust as needed as worsening control could account for worsening microalbuminemia.

## 2018-08-14 ENCOUNTER — Other Ambulatory Visit: Payer: Self-pay | Admitting: Internal Medicine

## 2018-08-14 MED ORDER — PRAVASTATIN SODIUM 20 MG PO TABS: 20.0000 mg | ORAL_TABLET | Freq: Every day | ORAL | 3 refills | Status: DC

## 2018-08-23 ENCOUNTER — Ambulatory Visit
Admission: RE | Admit: 2018-08-23 | Discharge: 2018-08-23 | Disposition: A | Discharge status: Home / Self Care | Payer: Medicare HMO | Source: Ambulatory Visit | Attending: Internal Medicine | Admitting: Internal Medicine

## 2018-08-23 DIAGNOSIS — N281 Cyst of kidney, acquired: Secondary | ICD-10-CM | POA: Diagnosis not present

## 2018-08-23 DIAGNOSIS — N261 Atrophy of kidney (terminal): Secondary | ICD-10-CM | POA: Diagnosis not present

## 2018-08-23 DIAGNOSIS — R350 Frequency of micturition: Secondary | ICD-10-CM

## 2018-09-18 ENCOUNTER — Telehealth: Payer: Self-pay

## 2018-09-18 NOTE — Telephone Encounter (Signed)
Copied from [place] [id number]. Topic: General - Other >> Sep 15, 2018  2:38 PM Mcneil, Ja-Kwan wrote: Reason for CRM: Pt stated she had an ultrasound done a few weeks ago and she would like to know the results. Pt requests call back. Cb# [phone]

## 2018-09-18 NOTE — Telephone Encounter (Signed)
Patient informed of results and stated understanding

## 2018-09-27 ENCOUNTER — Telehealth: Payer: Self-pay

## 2018-09-27 NOTE — Telephone Encounter (Signed)

## 2018-09-28 NOTE — Progress Notes (Signed)
Virtual Visit via Telephone Note   This visit type was conducted due to national recommendations for restrictions regarding the COVID-19 Pandemic (e.g. social distancing) in an effort to limit this patient's exposure and mitigate transmission in our community.  Due to her co-morbid illnesses, this patient is at least at moderate risk for complications without adequate follow up.  This format is felt to be most appropriate for this patient at this time.  The patient did not have access to video technology/had technical difficulties with video requiring transitioning to audio format only (telephone).  All issues noted in this document were discussed and addressed.  No physical exam could be performed with this format.  Please refer to the patient's chart for her  consent to telehealth for [facility].   Date:  09/29/2018   ID:  [name], DOB [birth year], MRN [id number]  Patient Location: Home Provider Location: Home  PCP:  Julie Koch, MD  Cardiologist:  Julie Carnes, MD   Electrophysiologist:  None   Evaluation Performed:  Follow-Up Visit  Chief Complaint:  AFib, HTN  History of Present Illness:    [name] is a 78 y.o. female with:  Parox atrial fibrillation   CHADS2-VASc=5 (female, age x 2, HTN, DM).  Pt has refused anticoagulation   Cath 2005: normal coronary arteries  Aortic stenosis  Hypertension   Diabetes mellitus   Hyperlipidemia   PUD  Fibromyalgia  Asthma   Gout   OSA  Colon CA s/p resection 2007  Remote pulmonary embolism 1990s  [name] was last seen in 2018.  Today's visit is for a follow-up.  Overall, she has been doing fairly well.  She continues to have episodes of atrial fibrillation.  These always seem to occur in the night.  They last for several hours.  She used to take an extra dose of diltiazem 240 mg.  However, she has run out of these.  Her heart rate sometimes gets as fast as 140s.  She does note some occasional  episodes of lightheadedness.  She has come in from working outside in the past and had significantly low blood pressures.  She denies syncope.  She has not had orthopnea or lower extremity swelling.  She has some occasional chest pains but no anginal symptoms.  She has chronic shortness of breath related to asthma without significant change.  Of note, she also gets lightheaded at times when she uses her arms.  She typically checks her blood pressures only in her left arm.  The patient does not have symptoms concerning for COVID-19 infection (fever, chills, cough, or new shortness of breath).    Past Medical History:  Diagnosis Date   Allergic rhinitis    Allergy    Anxiety    and panic attacks;pt states that she is claustrophobic   Asthma    Atrial fibrillation ([place])    Used to take Coumadin-instructed to stop   Bronchitis    in Jan 2013-uses Albuterol prn   Bronchitis, acute 03-01-11   FINSHED Z-PAK-STILL ON PREDNISONE NOW   Cataract    bilateral surgery to remove   Chronic back pain    hx buldging disc   Colon cancer ([place]) 2007   s/p surgery and chemo   Constipation    takes Colace prn   Diabetes mellitus    glimepiride   Dizziness    occasionally   DJD (degenerative joint disease)    Family history of anesthesia complication    daughter  gets PONV   Family hx of colon cancer    Fibromyalgia    but doesn't take any meds   Gastric ulcer    GERD (gastroesophageal reflux disease)    uses Tums prn   Gout    takes Colchicine prn   Hemorrhoids    History of blood clots    right lung in early 90's   History of colon polyps    History of migraine headaches    last migraine 88yr ago;Pt states she does get sinus headaches   History of PSVT (paroxysmal supraventricular tachycardia) no current problems   History of shingles    HLD (hyperlipidemia)    diet control - no meds   Hypertension    takes Micardis and Diltiazem daily   Nocturia     Numbness    left hand   Osteoarthritis    Peptic ulcer disease    Peripheral edema    takes Furosemide every other day   Peripheral edema    takes Lasix daily   Rash    on neck and started after she started taking her beta blocker 372yrago;medical MD has seen this   Shortness of breath    pt states that she can be sitting as well as exertion and get short of breath   Sleep apnea    doesn't use CPAP   Spinal headache    Urinary frequency    Urinary frequency    Yeast infection of the vagina    being treated for this now by dr.fontaine   Past Surgical History:  Procedure Laterality Date   ANKLE SURGERY     tumor-benign removed left ankle   ANTERIOR CERVICAL DECOMP/DISCECTOMY FUSION  01/12/2011   Procedure: ANTERIOR CERVICAL DECOMPRESSION/DISCECTOMY FUSION 2 LEVELS;  Surgeon: HeCooper Renderool;  [facility];  Service: Neurosurgery;  Laterality: Bilateral;  Cervical five-six, six-seven anterior cervical discectomy and fusion with allograft and plating   ANTERIOR CERVICAL DECOMP/DISCECTOMY FUSION  06/11/2011   Procedure: ANTERIOR CERVICAL DECOMPRESSION/DISCECTOMY FUSION 1 LEVEL/HARDWARE REMOVAL;  Surgeon: HeCharlie PitterMD;  [facility];  Service: Neurosurgery;  Laterality: N/A;  Cervical four-five anterior cervical decompression fusion with allograft and plating; Removal of Cervical five to seven plate   bilateral cataracts removed     caesarean section  1966/69/72   x 3   CARDIAC CATHETERIZATION  90's and 2005   CARPAL TUNNEL RELEASE Left 11/2013   cataract surgery  2010   COLECTOMY  2007   colon cancer   COLONOSCOPY     DILATION AND CURETTAGE OF UTERUS  02-19-11   & POLYP REMOVAL   GANGLION CYST EXCISION  > [age]yrgo   removed from left pointer finger   HEEL SPUR SURGERY Right 1992   x 2   HYSTEROSCOPY W/D&C  02/19/2011   Procedure: DILATATION AND CURETTAGE /HYSTEROSCOPY;  Surgeon: TimAnastasio AuerbachD;  [facility];  Service:  Gynecology;  Laterality: N/A;  requests one hour   JOINT REPLACEMENT Left    knee   knee arthrosocpy     bil;couple of years before knee replacement   KNEE SURGERY     left TKA   LUMBAR DISCarsonRGERY  2008   port a cath placed  2008   port a cath removed  2008   pyelonidal cystectomy     at age [age]SHOULDER ARTHROSCOPY WITH SUBACROMIAL DECOMPRESSION AND OPEN ROTATOR C Right 03/16/2013   Procedure: RIGHT SHOULDER ARTHROSCOPY WITH SUBACROMIAL  DECOMPRESSION AND OPEN ROTATOR CUFF REPAIR, OPEN DISTAL CLAVICLE  RESECTION POSSIBLE BICEP TENODESIS ;  Surgeon: Augustin Schooling, MD;  Location: [facility];  Service: Orthopedics;  Laterality: Right;   TRIGGER FINGER RELEASE Left    thumb, tendonitis, tumor removed   TUBAL LIGATION  1972     Current Meds  Medication Sig   albuterol (PROVENTIL HFA;VENTOLIN HFA) 108 (90 Base) MCG/ACT inhaler Inhale 2 puffs into the lungs every 6 (six) hours as needed for wheezing or shortness of breath.   aspirin 81 MG tablet Take 81 mg by mouth 2 (two) times a week.   baclofen (LIORESAL) 10 MG tablet Take 1 tablet (10 mg total) by mouth 2 (two) times daily as needed for muscle spasms.   Blood Glucose Monitoring Suppl (ACCU-CHEK AVIVA PLUS) w/Device KIT Used to check blood sugars twice daily   glucose blood (ACCU-CHEK AVIVA PLUS) test strip Use to check blood sugars twice a day   Lancets (ACCU-CHEK SOFT TOUCH) lancets Use to check blood sugars twice day   loperamide (IMODIUM) 2 MG capsule Take 4 mg by mouth as needed for diarrhea or loose stools.   losartan (COZAAR) 100 MG tablet TAKE 1 TABLET (100 MG TOTAL) BY MOUTH DAILY.   Promethazine HCl (PHENERGAN PO) Take by mouth as needed.   traMADol (ULTRAM) 50 MG tablet Take 1 tablet (50 mg total) by mouth daily as needed.   [DISCONTINUED] diltiazem (TIAZAC) 360 MG 24 hr capsule TAKE 1 CAPSULE EVERY DAY     Allergies:   Benazepril, Lopressor [metoprolol], Adhesive [tape], Morphine, Nifedipine, and  Piroxicam   Social History   Tobacco Use   Smoking status: Former Smoker    Packs/day: 1.50    Years: 30.00    Pack years: 45.00    Types: Cigarettes    Quit date: 02/23/1991    Years since quitting: 27.6   Smokeless tobacco: Never Used  Substance Use Topics   Alcohol use: No    Alcohol/week: 0.0 standard drinks   Drug use: No     Family Hx: The patient's family history includes Bladder Cancer in her brother; Coronary artery disease in an other family member; Heart attack in her mother; Heart disease in her father; Hypertension in her mother and sister; Kidney cancer in her sister; Liver cancer in her brother; Stroke in her mother. There is no history of Anesthesia problems, Hypotension, Malignant hyperthermia, Pseudochol deficiency, Stomach cancer, or Rectal cancer.  ROS:   Please see the history of present illness.     All other systems reviewed and are negative.   Prior CV studies:   The following studies were reviewed today:  Echo 05/23/15 Mod focal basal and mild conc LVH, vigorous LVF, EF 65-70, no RWMA, Gr 1 DD, mild AS (probably underestimated due to sub optimal angle - mean 8, peak 17), MAC, mild RAE, mild TR  Myoview 05/14/11 Normal stress nuclear study. LV Ejection Fraction: 70%. LV Wall Motion: NL LV Function; NL Wall Motion  Myoview 4/11:  EF 66%, no ischemia.   Echocardiogram 7/12:  Mild LVH, EF 55-60%, mild AI, diastolic dysfunction  Labs/Other Tests and Data Reviewed:    EKG:  No ECG reviewed.  Recent Labs: 08/10/2018: ALT 10; BUN 26; Creatinine, Ser 1.03; Hemoglobin 14.4; Platelets 233.0; Potassium 4.1; Sodium 139   Recent Lipid Panel Lab Results  Component Value Date/Time   CHOL 218 (H) 08/10/2018 02:57 PM   TRIG 162.0 (H) 08/10/2018 02:57 PM   HDL 65.40 08/10/2018 02:57 PM  CHOLHDL 3 08/10/2018 02:57 PM   LDLCALC 120 (H) 08/10/2018 02:57 PM   LDLDIRECT 123.0 08/03/2017 10:31 AM    Wt Readings from Last 3 Encounters:  09/29/18 215  lb (97.5 kg)  08/10/18 217 lb (98.4 kg)  04/20/18 216 lb (98 kg)     Objective:    Vital Signs:  BP (!) 154/79    Pulse 75    Ht _0  (1.6 m)    Wt 215 lb (97.5 kg)    LMP 12/30/2000    BMI 38.09 kg/m    VITAL SIGNS:  reviewed GEN:  no acute distress RESPIRATORY:  No labored breathing NEURO:  Alert and oriented PSYCH:  Good mood  ASSESSMENT & PLAN:    1. PAF (paroxysmal atrial fibrillation) ([place]) She continues to have paroxysms of atrial fibrillation.  These typically occur at night.  She also has some episodes of what sounds like hypotension.  She has recorded low blood pressures at times after working outside in the heat.  She has declined anticoagulation in the past despite her significant stroke risk [CHADS2-VASc=5 (female, age x 2, HTN, DM)].  I have recommended that we try changing her diltiazem to 240 mg in the morning 120 mg in the evening (12 hours apart).  If this does not improve her symptoms are makes them worse, we will resume diltiazem 360 mg daily.  Plan follow-up in 3 months.  2. Essential hypertension As noted, I will adjust her diltiazem as outlined above.  If she does not have any improvement with this or has worsening symptoms, we will resume diltiazem 360 mg daily and cut her losartan down to 50 mg daily.  I have asked her to check her blood pressure in both arms at times to see if there is a big discrepancy.  If there is, she will need carotid Dopplers.  3. Aortic valve stenosis, etiology of cardiac valve disease unspecified Mild aortic stenosis in the past.  She is not had an echocardiogram since 2017.  She has not really had any significant symptoms to suggest worsening aortic stenosis.  We can arrange a follow-up echocardiogram in the future once COVID-19 restrictions are lessened.  4. Type 2 diabetes mellitus with hyperlipidemia ([place]) She notes she is on diet therapy only now.  5. COVID-19 Education: The signs and symptoms of COVID-19 were discussed with the  patient and how to seek care for testing (follow up with PCP or arrange E-visit).  The importance of social distancing was discussed today.  Time:   Today, I have spent 20 minutes with the patient with telehealth technology discussing the above problems.     Medication Adjustments/Labs and Tests Ordered: Current medicines are reviewed at length with the patient today.  Concerns regarding medicines are outlined above.   Tests Ordered: No orders of the defined types were placed in this encounter.   Medication Changes: Meds ordered this encounter  Medications   diltiazem (CARDIZEM CD) 240 MG 24 hr capsule    Sig: Take 1 capsule by mouth every morning.    Dispense:  90 capsule    Refill:  3   diltiazem (CARDIZEM CD) 120 MG 24 hr capsule    Sig: Take 1 capsule by mouth every evening. Make sure you take this 12 hours after taking your morning dose of Diltiazem.    Dispense:  90 capsule    Refill:  3    Follow Up:  Virtual Visit or In Person in 3 month(s)  Signed, [employer]  Jorene Minors  09/29/2018 12:59 PM    [facility]

## 2018-09-29 ENCOUNTER — Telehealth (INDEPENDENT_AMBULATORY_CARE_PROVIDER_SITE_OTHER): Payer: Medicare HMO | Admitting: Physician Assistant

## 2018-09-29 ENCOUNTER — Other Ambulatory Visit: Payer: Self-pay

## 2018-09-29 ENCOUNTER — Encounter: Payer: Self-pay | Admitting: Physician Assistant

## 2018-09-29 VITALS — BP 154/79 | HR 75 | Ht 63.0 in | Wt 215.0 lb

## 2018-09-29 DIAGNOSIS — I480 Paroxysmal atrial fibrillation: Secondary | ICD-10-CM | POA: Diagnosis not present

## 2018-09-29 DIAGNOSIS — E785 Hyperlipidemia, unspecified: Secondary | ICD-10-CM

## 2018-09-29 DIAGNOSIS — I10 Essential (primary) hypertension: Secondary | ICD-10-CM

## 2018-09-29 DIAGNOSIS — Z7189 Other specified counseling: Secondary | ICD-10-CM

## 2018-09-29 DIAGNOSIS — E1169 Type 2 diabetes mellitus with other specified complication: Secondary | ICD-10-CM

## 2018-09-29 DIAGNOSIS — I350 Nonrheumatic aortic (valve) stenosis: Secondary | ICD-10-CM

## 2018-09-29 MED ORDER — DILTIAZEM HCL ER COATED BEADS 120 MG PO CP24: ORAL_CAPSULE | ORAL | 3 refills | Status: DC

## 2018-09-29 MED ORDER — DILTIAZEM HCL ER COATED BEADS 240 MG PO CP24: ORAL_CAPSULE | ORAL | 3 refills | Status: DC

## 2018-09-29 NOTE — Patient Instructions (Signed)
Medication Instructions:   Your physician has recommended you make the following change in your medication:   1) Change Diltiazem to 240MG , 1 capsule by mouth every morning and 120MG , 1 capsule by mouth every evening. [place] sure you take these 12 hours apart.   If you need a refill on your cardiac medications before your next appointment, please call your pharmacy.   Lab work:  None ordered today  If you have labs (blood work) drawn today and your tests are completely normal, you will receive your results only by: [name] MyChart Message (if you have MyChart) OR . A paper copy in the mail If you have any lab test that is abnormal or we need to change your treatment, we will call you to review the results.  Testing/Procedures:  None ordered today  Follow-Up:  On 11//10/2018 at 10:45AM with Richardson Dopp, PA(virtual visit)  Any Other Special Instructions Will Be Listed Below (If Applicable).  Check your blood pressure in both arms several times over the next few weeks, call our office if there is a big discrepancy between your right and left arm.

## 2018-10-11 ENCOUNTER — Other Ambulatory Visit: Payer: Self-pay | Admitting: Internal Medicine

## 2018-10-11 ENCOUNTER — Telehealth: Payer: Self-pay | Admitting: Internal Medicine

## 2018-10-11 NOTE — Telephone Encounter (Signed)
LMTCB

## 2018-10-11 NOTE — Telephone Encounter (Signed)
Called to speak with patient RE: her concerns.  Per husband pt is out shopping and he is unsure of when she will be back.  Advised we will try to c/b but if pt comes in soon she can call us back to discuss.  Husband states understanding.

## 2018-10-11 NOTE — Telephone Encounter (Signed)
  Pt c/o medication issue:  1. Name of Medication: diltiazem (CARDIZEM CD) 120 MG 24 hr capsule diltiazem (CARDIZEM CD) 240 MG 24 hr capsule losartan (COZAAR) 100 MG tablet  2. How are you currently taking this medication (dosage and times per day)? As directed  3. Are you having a reaction (difficulty breathing--STAT)?    4. What is your medication issue? Patient is having dry eyes and going to the bathroom all night and not getting sleep. She wants to know if there are medications that she can take that do not have diuretics in them.

## 2018-10-12 ENCOUNTER — Other Ambulatory Visit: Payer: Self-pay | Admitting: Internal Medicine

## 2018-10-12 NOTE — Telephone Encounter (Signed)
Please advise per Dr. Nathanial Millman absence. Thank you   Control database checked last refill: 04/14/2018 30 tabs LOV: 08/10/2018 [id number]

## 2018-10-12 NOTE — Telephone Encounter (Signed)
Done erx 

## 2018-10-25 DIAGNOSIS — H52223 Regular astigmatism, bilateral: Secondary | ICD-10-CM | POA: Diagnosis not present

## 2018-10-25 DIAGNOSIS — H524 Presbyopia: Secondary | ICD-10-CM | POA: Diagnosis not present

## 2018-10-27 ENCOUNTER — Other Ambulatory Visit (INDEPENDENT_AMBULATORY_CARE_PROVIDER_SITE_OTHER): Payer: Medicare HMO

## 2018-10-27 ENCOUNTER — Encounter: Payer: Self-pay | Admitting: Internal Medicine

## 2018-10-27 ENCOUNTER — Ambulatory Visit (INDEPENDENT_AMBULATORY_CARE_PROVIDER_SITE_OTHER): Payer: Medicare HMO | Admitting: Internal Medicine

## 2018-10-27 DIAGNOSIS — R350 Frequency of micturition: Secondary | ICD-10-CM | POA: Diagnosis not present

## 2018-10-27 DIAGNOSIS — I10 Essential (primary) hypertension: Secondary | ICD-10-CM | POA: Diagnosis not present

## 2018-10-27 LAB — COMPREHENSIVE METABOLIC PANEL
ALT: 13 U/L (ref 0–35)
AST: 17 U/L (ref 0–37)
Albumin: 4.3 g/dL (ref 3.5–5.2)
Alkaline Phosphatase: 80 U/L (ref 39–117)
BUN: 23 mg/dL (ref 6–23)
CO2: 26 mEq/L (ref 19–32)
Calcium: 9.5 mg/dL (ref 8.4–10.5)
Chloride: 103 mEq/L (ref 96–112)
Creatinine, Ser: 1.05 mg/dL (ref 0.40–1.20)
GFR: 50.88 mL/min — ABNORMAL LOW (ref >60.00)
Glucose, Bld: 105 mg/dL — ABNORMAL HIGH (ref 70–99)
Potassium: 3.7 mEq/L (ref 3.5–5.1)
Sodium: 139 mEq/L (ref 135–145)
Total Bilirubin: 0.6 mg/dL (ref 0.2–1.2)
Total Protein: 6.9 g/dL (ref 6.0–8.3)

## 2018-10-27 LAB — CBC
HCT: 42.3 % (ref 36.0–46.0)
Hemoglobin: 14.2 g/dL (ref 12.0–15.0)
MCHC: 33.7 g/dL (ref 30.0–36.0)
MCV: 88.8 fl (ref 78.0–100.0)
Platelets: 261 10*3/uL (ref 150.0–400.0)
RBC: 4.76 Mil/uL (ref 3.87–5.11)
RDW: 13.8 % (ref 11.5–15.5)
WBC: 8.4 10*3/uL (ref 4.0–10.5)

## 2018-10-27 LAB — MAGNESIUM: Magnesium: 2.1 mg/dL (ref 1.5–2.5)

## 2018-10-27 NOTE — Progress Notes (Signed)
Virtual Visit via Audio Note  I connected with MERDIS SLATE on 10/27/18 at  1:20 PM EDT by an audio-only enabled telemedicine application and verified that I am speaking with the correct person using two identifiers.  The patient and the provider were at separate locations throughout the entire encounter.   I discussed the limitations of evaluation and management by telemedicine and the availability of in person appointments. The patient expressed understanding and agreed to proceed.  History of Present Illness: The patient is a 78 y.o. female with visit for headaches and nausea and tinnitus. Started about several weeks ago. Has ear pain and eye pain along with this. Is also getting nauseous and getting tired. Denies fevers or chills. Denies diarrhea or constipation. Does have nausea but is still eating some. Overall it is not improving. Has tried her normal tramadol which does not help. She was changed to a higher dose of diltiazem to 360 mg daily. She feels that this has caused her to go to the bathroom much more often and wonders if this is causing her headaches. She is now taking diltiazem 360 tablet rather than the split dosage.   Observations/Objective: Voice strong, no coughing or dyspnea during visit, A and O times 3  BP 140/80 HR 75 this morning  Assessment and Plan: See problem oriented charting  Follow Up Instructions: labs, half losartan to 50 mg daily to see if this helps symptoms.   Visit time 12 minutes: that time was spent in non-face to face counseling and coordination of care with the patient: counseled about as above  I discussed the assessment and treatment plan with the patient. The patient was provided an opportunity to ask questions and all were answered. The patient agreed with the plan and demonstrated an understanding of the instructions.   The patient was advised to call back or seek an in-person evaluation if the symptoms worsen or if the condition fails to improve  as anticipated.  Hoyt Koch, MD

## 2018-10-27 NOTE — Assessment & Plan Note (Signed)
Will try reducing dose losartan to 50 mg daily to see if this can help with her urination and other constellation of symptoms. If not, she is asked to call her cardiologist to discuss diltiazem and potential side effects.

## 2018-10-27 NOTE — Assessment & Plan Note (Signed)
Losartan 50 mg daily to see if this can help with symptoms. New headaches may be due to lack of sleep.

## 2018-11-02 NOTE — Telephone Encounter (Signed)
Pt reached out to PCP. Losartan was decreased to 50 mg daily. Pt was instructed to call back to office if this did not help symptoms per PCP notes.

## 2018-11-03 ENCOUNTER — Ambulatory Visit (INDEPENDENT_AMBULATORY_CARE_PROVIDER_SITE_OTHER): Payer: Medicare HMO | Admitting: Family

## 2018-11-03 ENCOUNTER — Encounter: Payer: Self-pay | Admitting: Family

## 2018-11-03 DIAGNOSIS — I10 Essential (primary) hypertension: Secondary | ICD-10-CM | POA: Diagnosis not present

## 2018-11-03 DIAGNOSIS — H9319 Tinnitus, unspecified ear: Secondary | ICD-10-CM

## 2018-11-03 NOTE — Progress Notes (Signed)
[name] is a 78 y.o. female with the following history as recorded in EpicCare:  Patient Active Problem List   Diagnosis Date Noted  . Frequent urination 08/10/2018  . Breast pain, right 04/14/2018  . Leg pain 08/05/2017  . Morbid obesity ([place]) 06/02/2016  . Renal cyst 01/12/2016  . Chronic back pain 05/23/2015  . PAF (paroxysmal atrial fibrillation) ([place]) 05/22/2015  . Degeneration of intervertebral disc of lumbosacral region 04/11/2015  . Fibromyalgia 04/11/2015  . Hyperlipidemia associated with type 2 diabetes mellitus ([place]) 02/10/2011  . SLEEP APNEA, OBSTRUCTIVE 04/24/2010  . Type 2 diabetes mellitus with hyperlipidemia ([place]) 12/25/2008  . History of malignant neoplasm of large intestine 12/25/2008  . Essential hypertension 01/03/2007  . Asthma 01/03/2007    Current Outpatient Medications  Medication Sig Dispense Refill  . albuterol (PROVENTIL HFA;VENTOLIN HFA) 108 (90 Base) MCG/ACT inhaler Inhale 2 puffs into the lungs every 6 (six) hours as needed for wheezing or shortness of breath. 1 Inhaler 1  . aspirin 81 MG tablet Take 81 mg by mouth 2 (two) times a week.    . baclofen (LIORESAL) 10 MG tablet Take 1 tablet (10 mg total) by mouth 2 (two) times daily as needed for muscle spasms. 30 each 0  . Blood Glucose Monitoring Suppl (ACCU-CHEK AVIVA PLUS) w/Device KIT Used to check blood sugars twice daily 1 kit 0  . diltiazem (CARDIZEM CD) 120 MG 24 hr capsule Take 1 capsule by mouth every evening. Make sure you take this 12 hours after taking your morning dose of Diltiazem. 90 capsule 3  . diltiazem (CARDIZEM CD) 240 MG 24 hr capsule Take 1 capsule by mouth every morning. 90 capsule 3  . glucose blood (ACCU-CHEK AVIVA PLUS) test strip Use to check blood sugars twice a day 300 each 1  . Lancets (ACCU-CHEK SOFT TOUCH) lancets Use to check blood sugars twice day 300 each 1  . loperamide (IMODIUM) 2 MG capsule Take 4 mg by mouth as needed for diarrhea or loose stools.    [name] losartan  (COZAAR) 100 MG tablet TAKE 1 TABLET (100 MG TOTAL) BY MOUTH DAILY. 90 tablet 1  . Promethazine HCl (PHENERGAN PO) Take by mouth as needed.    . traMADol (ULTRAM) 50 MG tablet TAKE 1 TABLET (50 MG TOTAL) BY MOUTH DAILY AS NEEDED. 30 tablet 0   No current facility-administered medications for this visit.     Allergies: Benazepril, Lopressor [metoprolol], Adhesive [tape], Morphine, Nifedipine, and Piroxicam  Past Medical History:  Diagnosis Date  . Allergic rhinitis   . Allergy   . Anxiety    and panic attacks;pt states that she is claustrophobic  . Asthma   . Atrial fibrillation ([place])    Used to take Coumadin-instructed to stop  . Bronchitis    in Jan 2013-uses Albuterol prn  . Bronchitis, acute 03-01-11   FINSHED Z-PAK-STILL ON PREDNISONE NOW  . Cataract    bilateral surgery to remove  . Chronic back pain    hx buldging disc  . Colon cancer ([place]) 2007   s/p surgery and chemo  . Constipation    takes Colace prn  . Diabetes mellitus    glimepiride  . Dizziness    occasionally  . DJD (degenerative joint disease)   . Family history of anesthesia complication    daughter gets PONV  . Family hx of colon cancer   . Fibromyalgia    but doesn't take any meds  . Gastric ulcer   . GERD (  gastroesophageal reflux disease)    uses Tums prn  . Gout    takes Colchicine prn  . Hemorrhoids   . History of blood clots    right lung in early 90's  . History of colon polyps   . History of migraine headaches    last migraine 53yr ago;Pt states she does get sinus headaches  . History of PSVT (paroxysmal supraventricular tachycardia) no current problems  . History of shingles   . HLD (hyperlipidemia)    diet control - no meds  . Hypertension    takes Micardis and Diltiazem daily  . Nocturia   . Numbness    left hand  . Osteoarthritis   . Peptic ulcer disease   . Peripheral edema    takes Furosemide every other day  . Peripheral edema    takes Lasix daily  . Rash    on neck and  started after she started taking her beta blocker 312yrago;medical MD has seen this  . Shortness of breath    pt states that she can be sitting as well as exertion and get short of breath  . Sleep apnea    doesn't use CPAP  . Spinal headache   . Urinary frequency   . Urinary frequency   . Yeast infection of the vagina    being treated for this now by dr.fontaine    Past Surgical History:  Procedure Laterality Date  . ANKLE SURGERY     tumor-benign removed left ankle  . ANTERIOR CERVICAL DECOMP/DISCECTOMY FUSION  01/12/2011   Procedure: ANTERIOR CERVICAL DECOMPRESSION/DISCECTOMY FUSION 2 LEVELS;  Surgeon: HeCooper Renderool;  [facility];  Service: Neurosurgery;  Laterality: Bilateral;  Cervical five-six, six-seven anterior cervical discectomy and fusion with allograft and plating  . ANTERIOR CERVICAL DECOMP/DISCECTOMY FUSION  06/11/2011   Procedure: ANTERIOR CERVICAL DECOMPRESSION/DISCECTOMY FUSION 1 LEVEL/HARDWARE REMOVAL;  Surgeon: HeCharlie PitterMD;  [facility];  Service: Neurosurgery;  Laterality: N/A;  Cervical four-five anterior cervical decompression fusion with allograft and plating; Removal of Cervical five to seven plate  . bilateral cataracts removed    . caesarean section  1966/69/72   x 3  . CARDIAC CATHETERIZATION  90's and 2005  . CARPAL TUNNEL RELEASE Left 11/2013  . cataract surgery  2010  . COLECTOMY  2007   colon cancer  . COLONOSCOPY    . DILATION AND CURETTAGE OF UTERUS  02-19-11   & POLYP REMOVAL  . GANGLION CYST EXCISION  > 1069yrgo   removed from left pointer finger  . HEEL SPUR SURGERY Right 1992   x 2  . HYSTEROSCOPY W/D&C  02/19/2011   Procedure: DILATATION AND CURETTAGE /HYSTEROSCOPY;  Surgeon: TimAnastasio AuerbachD;  [facility];  Service: Gynecology;  Laterality: N/A;  requests one hour  . JOINT REPLACEMENT Left    knee  . knee arthrosocpy     bil;couple of years before knee replacement  . KNEE SURGERY     left TKA  .  [place]  2008  . port a cath placed  2008  . port a cath removed  2008  . pyelonidal cystectomy     at age [age] SHOULDER ARTHROSCOPY WITH SUBACROMIAL DECOMPRESSION AND OPEN ROTATOR C Right 03/16/2013   Procedure: RIGHT SHOULDER ARTHROSCOPY WITH SUBACROMIAL DECOMPRESSION AND OPEN ROTATOR CUFF REPAIR, OPEN DISTAL CLAVICLE  RESECTION POSSIBLE BICEP TENODESIS ;  Surgeon: SteAugustin SchoolingD;  [facility];  Laterality: Right;  . TRIGGER FINGER RELEASE Left    thumb, tendonitis, tumor removed  . TUBAL LIGATION  1972    Family History  Problem Relation Age of Onset  . Heart attack Mother   . Stroke Mother   . Hypertension Mother   . Heart disease Father   . Hypertension Sister   . Kidney cancer Sister        left  . Bladder Cancer Brother   . Liver cancer Brother        spread to colon  . Coronary artery disease Other   . Anesthesia problems Neg Hx   . Hypotension Neg Hx   . Malignant hyperthermia Neg Hx   . Pseudochol deficiency Neg Hx   . Stomach cancer Neg Hx   . Rectal cancer Neg Hx     Social History   Tobacco Use  . Smoking status: Former Smoker    Packs/day: 1.50    Years: 30.00    Pack years: 45.00    Types: Cigarettes    Quit date: 02/23/1991    Years since quitting: 27.7  . Smokeless tobacco: Never Used  Substance Use Topics  . Alcohol use: No    Alcohol/week: 0.0 standard drinks    Subjective:    I connected with [name] on 11/03/18 at  1:00 PM EDT by a telephone call and verified that I am speaking with the correct person using two identifiers.   I discussed the limitations of evaluation and management by telemedicine and the availability of in person appointments. The patient expressed understanding and agreed to proceed.  Complaining of chronic "ringing in her ears"- notes that symptoms have been worsening recently; wants to know if we can call in something for her; denies any hearing loss; Also mentions that she  feels her heart beating in her ears at night when she lies on her right side- admits that blood pressure has "been up and down"     Objective:  There were no vitals filed for this visit.  Lungs: Respirations unlabored;  Neurologic: Alert and oriented; speech intact;   Assessment:  1. Tinnitus, unspecified laterality   2. Essential hypertension     Plan:  1. Refer to ENT;  2. Based on last note with her PCP, it was recommended that she call her cardiologist about persisting concerns; she agrees to schedule that appointment.   Spent 11 minutes on the phone call  No follow-ups on file.  Orders Placed This Encounter  Procedures  . Ambulatory referral to ENT    Referral Priority:   Routine    Referral Type:   Consultation    Referral Reason:   Specialty Services Required    Requested Specialty:   Otolaryngology    Number of Visits Requested:   1    Requested Prescriptions    No prescriptions requested or ordered in this encounter

## 2018-11-06 ENCOUNTER — Other Ambulatory Visit: Payer: Self-pay | Admitting: Internal Medicine

## 2018-11-16 ENCOUNTER — Other Ambulatory Visit: Payer: Self-pay | Admitting: Internal Medicine

## 2018-11-28 ENCOUNTER — Other Ambulatory Visit: Payer: Self-pay | Admitting: Internal Medicine

## 2018-12-07 DIAGNOSIS — H9313 Tinnitus, bilateral: Secondary | ICD-10-CM | POA: Diagnosis not present

## 2018-12-07 DIAGNOSIS — H903 Sensorineural hearing loss, bilateral: Secondary | ICD-10-CM | POA: Diagnosis not present

## 2018-12-10 ENCOUNTER — Other Ambulatory Visit: Payer: Self-pay | Admitting: Internal Medicine

## 2018-12-18 ENCOUNTER — Telehealth: Payer: Self-pay | Admitting: Internal Medicine

## 2018-12-18 ENCOUNTER — Other Ambulatory Visit: Payer: Self-pay | Admitting: Internal Medicine

## 2018-12-18 NOTE — Telephone Encounter (Signed)
Patient is requesting medication prescribed by a historical provider , see encounter below

## 2018-12-18 NOTE — Telephone Encounter (Signed)
Control database checked last refill:10/12/2018 30 tabs LOV: acute 11/03/2018 [id number]

## 2018-12-18 NOTE — Telephone Encounter (Signed)
Can you make patient a virtual or phone call to discuss

## 2018-12-18 NOTE — Telephone Encounter (Signed)
Medication: traMADol (ULTRAM) 50 MG tablet [id number] - requesting a 10 supply sent to local pharmacy  Has the patient contacted their pharmacy? Yes  (Agent: If no, request that the patient contact the pharmacy for the refill.) (Agent: If yes, when and what did the pharmacy advise?)  Preferred Pharmacy (with phone number or street name): [id number] - [place], [place] - [address] [phone] (Phone) [phone] (Fax)    Agent: Please be advised that RX refills may take up to 3 business days. We ask that you follow-up with your pharmacy.

## 2018-12-18 NOTE — Telephone Encounter (Signed)
Medication: Promethazine HCl (PHENERGAN PO) [id number] ,   Has the patient contacted their pharmacy? Yes  (Agent: If no, request that the patient contact the pharmacy for the refill.) (Agent: If yes, when and what did the pharmacy advise?)  Preferred Pharmacy (with phone number or street name): [place], [place] [phone] (Phone) [phone] (Fax)   Agent: Please be advised that RX refills may take up to 3 business days. We ask that you follow-up with your pharmacy.

## 2018-12-18 NOTE — Telephone Encounter (Signed)
Is she having nausea? Does she need acute visit for this nausea?

## 2018-12-19 ENCOUNTER — Other Ambulatory Visit: Payer: Self-pay

## 2018-12-19 MED ORDER — BACLOFEN 10 MG PO TABS: ORAL_TABLET | ORAL | 0 refills | Status: DC

## 2018-12-19 NOTE — Telephone Encounter (Signed)
LVM for patient to call back to make an appt  °

## 2018-12-26 NOTE — Telephone Encounter (Signed)
[facility]

## 2018-12-26 NOTE — Telephone Encounter (Signed)
[employer] checking on the status of traMADol (ULTRAM) 50 MG tablet request requesting a 30 day supply.

## 2018-12-31 NOTE — Progress Notes (Signed)
Virtual Visit via Telephone Note   This visit type was conducted due to national recommendations for restrictions regarding the COVID-19 Pandemic (e.g. social distancing) in an effort to limit this patient's exposure and mitigate transmission in our community.  Due to her co-morbid illnesses, this patient is at least at moderate risk for complications without adequate follow up.  This format is felt to be most appropriate for this patient at this time.  The patient did not have access to video technology/had technical difficulties with video requiring transitioning to audio format only (telephone).  All issues noted in this document were discussed and addressed.  No physical exam could be performed with this format.  Please refer to the patient's chart for her  consent to telehealth for [facility].  I did obtain verbal consent today.   Date:  01/01/2019   ID:  [name], DOB 08-16-1940, MRN [id number]  Patient Location: Home Provider Location: Office  PCP:  Hoyt Koch, MD  Cardiologist:  Dorris Carnes, MD   Electrophysiologist:  None   Evaluation Performed:  Follow-Up Visit  Chief Complaint:  AFib, HTN  History of Present Illness:    [name] is a 78 y.o. female with:  Parox atrial fibrillation  ? CHADS2-VASc=5 (female, age x 2, HTN, DM). ? Pt has refused anticoagulation   Cath 2005: normal coronary arteries  Aortic stenosis  Hypertension   Diabetes mellitus   Hyperlipidemia   PUD  Fibromyalgia  Asthma   Gout   OSA  Colon CA s/p resection 2007  Remote pulmonary embolism 1990s  She was last seen via Telemedicine in 09/2018.  She noted episodes of atrial fibrillation mainly at night.  I adjusted her Diltiazem to 240 mg in the AM and 120 mg in the PM.    Today, she notes that she had to go back to taking Diltiazem 360 mg once daily b/c her BP was too high.  She notes that she has had 2 episodes of atrial fibrillation since June.  The most  recent one was 2 weeks ago.  Her symptoms lasted a few hours in the morning.  Her BP typically is 947M-546T systolic.  She remains active and does a lot of yard work.  She has not had chest pain, shortness of breath, syncope.      Past Medical History:  Diagnosis Date   Allergic rhinitis    Allergy    Anxiety    and panic attacks;pt states that she is claustrophobic   Asthma    Atrial fibrillation ([place])    Used to take Coumadin-instructed to stop   Bronchitis    in Jan 2013-uses Albuterol prn   Bronchitis, acute 03-01-11   FINSHED Z-PAK-STILL ON PREDNISONE NOW   Cataract    bilateral surgery to remove   Chronic back pain    hx buldging disc   Colon cancer ([place]) 2007   s/p surgery and chemo   Constipation    takes Colace prn   Diabetes mellitus    glimepiride   Dizziness    occasionally   DJD (degenerative joint disease)    Family history of anesthesia complication    daughter gets PONV   Family hx of colon cancer    Fibromyalgia    but doesn't take any meds   Gastric ulcer    GERD (gastroesophageal reflux disease)    uses Tums prn   Gout    takes Colchicine prn   Hemorrhoids    History  of blood clots    right lung in early 90's   History of colon polyps    History of migraine headaches    last migraine 20yr ago;Pt states she does get sinus headaches   History of PSVT (paroxysmal supraventricular tachycardia) no current problems   History of shingles    HLD (hyperlipidemia)    diet control - no meds   Hypertension    takes Micardis and Diltiazem daily   Nocturia    Numbness    left hand   Osteoarthritis    Peptic ulcer disease    Peripheral edema    takes Furosemide every other day   Peripheral edema    takes Lasix daily   Rash    on neck and started after she started taking her beta blocker 353yrago;medical MD has seen this   Shortness of breath    pt states that she can be sitting as well as exertion and get short of  breath   Sleep apnea    doesn't use CPAP   Spinal headache    Urinary frequency    Urinary frequency    Yeast infection of the vagina    being treated for this now by dr.fontaine   Past Surgical History:  Procedure Laterality Date   ANKLE SURGERY     tumor-benign removed left ankle   ANTERIOR CERVICAL DECOMP/DISCECTOMY FUSION  01/12/2011   Procedure: ANTERIOR CERVICAL DECOMPRESSION/DISCECTOMY FUSION 2 LEVELS;  Surgeon: HeCooper Renderool;  [facility];  Service: Neurosurgery;  Laterality: Bilateral;  Cervical five-six, six-seven anterior cervical discectomy and fusion with allograft and plating   ANTERIOR CERVICAL DECOMP/DISCECTOMY FUSION  06/11/2011   Procedure: ANTERIOR CERVICAL DECOMPRESSION/DISCECTOMY FUSION 1 LEVEL/HARDWARE REMOVAL;  Surgeon: HeCharlie PitterMD;  [facility];  Service: Neurosurgery;  Laterality: N/A;  Cervical four-five anterior cervical decompression fusion with allograft and plating; Removal of Cervical five to seven plate   bilateral cataracts removed     caesarean section  1966/69/72   x 3   CARDIAC CATHETERIZATION  90's and 2005   CARPAL TUNNEL RELEASE Left 11/2013   cataract surgery  2010   COLECTOMY  2007   colon cancer   COLONOSCOPY     DILATION AND CURETTAGE OF UTERUS  02-19-11   & POLYP REMOVAL   GANGLION CYST EXCISION  > [age]yrgo   removed from left pointer finger   HEEL SPUR SURGERY Right 1992   x 2   HYSTEROSCOPY W/D&C  02/19/2011   Procedure: DILATATION AND CURETTAGE /HYSTEROSCOPY;  Surgeon: TimAnastasio AuerbachD;  [facility];  Service: Gynecology;  Laterality: N/A;  requests one hour   JOINT REPLACEMENT Left    knee   knee arthrosocpy     bil;couple of years before knee replacement   KNEE SURGERY     left TKA   LUMBAR DISAvocaRGERY  2008   port a cath placed  2008   port a cath removed  2008   pyelonidal cystectomy     at age [age]SHOULDER ARTHROSCOPY WITH SUBACROMIAL DECOMPRESSION AND  OPEN ROTATOR C Right 03/16/2013   Procedure: RIGHT SHOULDER ARTHROSCOPY WITH SUBACROMIAL DECOMPRESSION AND OPEN ROTATOR CUFF REPAIR, OPEN DISTAL CLAVICLE  RESECTION POSSIBLE BICEP TENODESIS ;  Surgeon: SteAugustin SchoolingD;  [facility];  Laterality: Right;   TRIGGER FINGER RELEASE Left    thumb, tendonitis, tumor removed   TUBAL LIGATION  1972  Current Meds  Medication Sig   ACCU-CHEK AVIVA PLUS test strip TEST BLOOD SUGAR TWICE DAILY   albuterol (PROVENTIL HFA;VENTOLIN HFA) 108 (90 Base) MCG/ACT inhaler Inhale 2 puffs into the lungs every 6 (six) hours as needed for wheezing or shortness of breath.   aspirin 81 MG tablet Take 81 mg by mouth 2 (two) times a week.   baclofen (LIORESAL) 10 MG tablet TAKE 1 TABLET BY MOUTH TWICE A DAY AS NEEDED FOR MUSCLE SPASMS   Blood Glucose Monitoring Suppl (ACCU-CHEK AVIVA PLUS) w/Device KIT Used to check blood sugars twice daily   diltiazem (CARDIZEM CD) 360 MG 24 hr capsule Take 1 capsule (360 mg total) by mouth daily.   Lancets (ACCU-CHEK SOFT TOUCH) lancets Use to check blood sugars twice day   loperamide (IMODIUM) 2 MG capsule Take 4 mg by mouth as needed for diarrhea or loose stools.   losartan (COZAAR) 100 MG tablet TAKE 1 TABLET (100 MG TOTAL) BY MOUTH DAILY.   Promethazine HCl (PHENERGAN PO) Take by mouth as needed.   traMADol (ULTRAM) 50 MG tablet TAKE 1 TABLET (50 MG TOTAL) BY MOUTH DAILY AS NEEDED.     Allergies:   Benazepril, Lopressor [metoprolol], Adhesive [tape], Morphine, Nifedipine, and Piroxicam   Social History   Tobacco Use   Smoking status: Former Smoker    Packs/day: 1.50    Years: 30.00    Pack years: 45.00    Types: Cigarettes    Quit date: 02/23/1991    Years since quitting: 27.8   Smokeless tobacco: Never Used  Substance Use Topics   Alcohol use: No    Alcohol/week: 0.0 standard drinks   Drug use: No     Family Hx: The patient's family history includes Bladder Cancer in  her brother; Coronary artery disease in an other family member; Heart attack in her mother; Heart disease in her father; Hypertension in her mother and sister; Kidney cancer in her sister; Liver cancer in her brother; Stroke in her mother. There is no history of Anesthesia problems, Hypotension, Malignant hyperthermia, Pseudochol deficiency, Stomach cancer, or Rectal cancer.  ROS:   Please see the history of present illness.     All other systems reviewed and are negative.   Prior CV studies:   The following studies were reviewed today:  Echo 05/23/15 Mod focal basal and mild conc LVH, vigorous LVF, EF 65-70, no RWMA, Gr 1 DD, mild AS (probably underestimated due to sub optimal angle - mean 8, peak 17), MAC, mild RAE, mild TR  Myoview 05/14/11 Normal stress nuclear study.LV Ejection Fraction: 70%. LV Wall Motion: NL LV Function; NL Wall Motion  Myoview 4/11:  EF 66%, no ischemia.  Echocardiogram 7/12:  Mild LVH, EF 55-60%, mild AI, diastolic dysfunction  Labs/Other Tests and Data Reviewed:    EKG:  No ECG reviewed.  Recent Labs: 10/27/2018: ALT 13; BUN 23; Creatinine, Ser 1.05; Hemoglobin 14.2; Magnesium 2.1; Platelets 261.0; Potassium 3.7; Sodium 139   Recent Lipid Panel Lab Results  Component Value Date/Time   CHOL 218 (H) 08/10/2018 02:57 PM   TRIG 162.0 (H) 08/10/2018 02:57 PM   HDL 65.40 08/10/2018 02:57 PM   CHOLHDL 3 08/10/2018 02:57 PM   LDLCALC 120 (H) 08/10/2018 02:57 PM   LDLDIRECT 123.0 08/03/2017 10:31 AM    Wt Readings from Last 3 Encounters:  01/01/19 215 lb (97.5 kg)  09/29/18 215 lb (97.5 kg)  08/10/18 217 lb (98.4 kg)     Objective:    Vital Signs:  BP 132/70    Ht _0  (1.6 m)    Wt 215 lb (97.5 kg)    LMP 12/30/2000    BMI 38.09 kg/m    VITAL SIGNS:  reviewed GEN:  no acute distress RESPIRATORY:  no labored breathing NEURO:  alert and oriented PSYCH:  normal mood  ASSESSMENT & PLAN:    1. PAF (paroxysmal atrial fibrillation)  ([place]) She has declined anticoagulation in the past.  She continues to have paroxysms of atrial fibrillation.  They do not seem to be any more frequent.  She seems to manage them at home well and will take an extra Diltiazem as needed.  She is not on anticoagulation.  I have recommended that she contact our office if her symptoms go on for several hours so that we can help manage them.  I have also asked her to call us if she has more frequent episodes.  At that point, we could consider Flecainide to control her rhythm better or refer her to the AFib clinic.  Follow up with Dr. Harrington Challenger or me in 6 mos.   2. Essential hypertension Her BP seems to be running above target.  Continue Diltiazem and Losartan.  Add HCTZ 12.5 mg once daily.  Obtain BMET in 1-2 weeks.   3. Aortic valve stenosis, etiology of cardiac valve disease unspecified No symptoms to suggest worsening.  She is due for a follow up echocardiogram.  This will be arranged.      Time:   Today, I have spent 22 minutes with the patient with telehealth technology discussing the above problems.     Medication Adjustments/Labs and Tests Ordered: Current medicines are reviewed at length with the patient today.  Concerns regarding medicines are outlined above.   Tests Ordered: Orders Placed This Encounter  Procedures   Basic Metabolic Panel (BMET)   ECHOCARDIOGRAM COMPLETE    Medication Changes: Meds ordered this encounter  Medications   hydrochlorothiazide (HYDRODIURIL) 12.5 MG tablet    Sig: Take 1 tablet (12.5 mg total) by mouth daily.    Dispense:  90 tablet    Refill:  1   diltiazem (CARDIZEM CD) 360 MG 24 hr capsule    Sig: Take 1 capsule (360 mg total) by mouth daily.    Dispense:       Order Specific Question:   Supervising Provider    Answer:   Lelon Perla [[id number]]    Follow Up:  Either In Person or Virtual in 6 month(s)  Signed, Richardson Dopp, PA-C  01/01/2019 1:01 PM    [place]

## 2019-01-01 ENCOUNTER — Other Ambulatory Visit: Payer: Self-pay

## 2019-01-01 ENCOUNTER — Telehealth (INDEPENDENT_AMBULATORY_CARE_PROVIDER_SITE_OTHER): Payer: Medicare HMO | Admitting: Physician Assistant

## 2019-01-01 ENCOUNTER — Encounter: Payer: Self-pay | Admitting: Physician Assistant

## 2019-01-01 VITALS — BP 132/70 | Ht 63.0 in | Wt 215.0 lb

## 2019-01-01 DIAGNOSIS — I480 Paroxysmal atrial fibrillation: Secondary | ICD-10-CM

## 2019-01-01 DIAGNOSIS — I350 Nonrheumatic aortic (valve) stenosis: Secondary | ICD-10-CM

## 2019-01-01 DIAGNOSIS — I10 Essential (primary) hypertension: Secondary | ICD-10-CM

## 2019-01-01 MED ORDER — HYDROCHLOROTHIAZIDE 12.5 MG PO TABS: 12.5000 mg | ORAL_TABLET | Freq: Every day | ORAL | 1 refills | Status: DC

## 2019-01-01 MED ORDER — DILTIAZEM HCL ER COATED BEADS 360 MG PO CP24: 360.0000 mg | ORAL_CAPSULE | Freq: Every day | ORAL | Status: DC

## 2019-01-01 NOTE — Patient Instructions (Signed)
Medication Instructions:   Your physician has recommended you make the following change in your medication:   1) Start Hydrochlorothiazide 12.5MG , 1 tablet by mouth once a day. I have sent this prescription to [facility] order pharmacy.  *If you need a refill on your cardiac medications before your next appointment, please call your pharmacy*  Lab Work:  Your physician recommends that you return for lab work on 01/11/19 at 11:30AM  If you have labs (blood work) drawn today and your tests are completely normal, you will receive your results only by: [name] MyChart Message (if you have MyChart) OR . A paper copy in the mail If you have any lab test that is abnormal or we need to change your treatment, we will call you to review the results.  Testing/Procedures:  Your physician has requested that you have an echocardiogram on 01/11/19 at 10:15AM. Echocardiography is a painless test that uses sound waves to create images of your heart. It provides your doctor with information about the size and shape of your heart and how well your heart's chambers and valves are working. This procedure takes approximately one hour. There are no restrictions for this procedure.  Follow-Up: At [facility], you and your health needs are our priority.  As part of our continuing mission to provide you with exceptional heart care, we have created designated Provider Care Teams.  These Care Teams include your primary Cardiologist (physician) and Advanced Practice Providers (APPs -  Physician Assistants and Nurse Practitioners) who all work together to provide you with the care you need, when you need it.  Your next appointment:   6 months  The format for your next appointment:   In Person  Provider:   Dorris Carnes, MD

## 2019-01-09 ENCOUNTER — Other Ambulatory Visit: Payer: Self-pay | Admitting: Internal Medicine

## 2019-01-11 ENCOUNTER — Other Ambulatory Visit: Payer: Medicare HMO

## 2019-01-11 ENCOUNTER — Other Ambulatory Visit (HOSPITAL_COMMUNITY): Payer: Medicare HMO

## 2019-01-15 ENCOUNTER — Encounter (HOSPITAL_COMMUNITY): Payer: Self-pay | Admitting: Physician Assistant

## 2019-01-22 ENCOUNTER — Telehealth (HOSPITAL_COMMUNITY): Payer: Self-pay

## 2019-01-22 NOTE — Telephone Encounter (Signed)
New message    Just an FYI. We have made several attempts to contact this patient including sending a letter to schedule or reschedule their echocardiogram. We will be removing the patient from the echo WQ.   11.25.20 @ 11:33am lm on home vm - Olliver Boyadjian  11.23.20 mail reminder letter Lourdes Manning  11.19.20 no show

## 2019-01-22 NOTE — Telephone Encounter (Signed)
Ok.  Thanks. Richardson Dopp, PA-C    01/22/2019 4:23 PM

## 2019-01-26 ENCOUNTER — Other Ambulatory Visit: Payer: Self-pay | Admitting: Internal Medicine

## 2019-01-29 NOTE — Telephone Encounter (Signed)
[place] Controlled Database Checked Last filled: 12/18/18 # 30 LOV w/you: 10/27/18 Next appt w/you: None

## 2019-02-05 DIAGNOSIS — S0502XA Injury of conjunctiva and corneal abrasion without foreign body, left eye, initial encounter: Secondary | ICD-10-CM | POA: Diagnosis not present

## 2019-02-19 DIAGNOSIS — M545 Low back pain: Secondary | ICD-10-CM | POA: Diagnosis not present

## 2019-02-19 DIAGNOSIS — M5416 Radiculopathy, lumbar region: Secondary | ICD-10-CM | POA: Diagnosis not present

## 2019-03-21 ENCOUNTER — Other Ambulatory Visit: Payer: Self-pay | Admitting: Internal Medicine

## 2019-03-23 ENCOUNTER — Other Ambulatory Visit: Payer: Self-pay | Admitting: Internal Medicine

## 2019-04-19 DIAGNOSIS — M79644 Pain in right finger(s): Secondary | ICD-10-CM | POA: Diagnosis not present

## 2019-04-19 DIAGNOSIS — M65311 Trigger thumb, right thumb: Secondary | ICD-10-CM | POA: Diagnosis not present

## 2019-04-19 DIAGNOSIS — M13841 Other specified arthritis, right hand: Secondary | ICD-10-CM | POA: Diagnosis not present

## 2019-05-09 DIAGNOSIS — H35371 Puckering of macula, right eye: Secondary | ICD-10-CM | POA: Diagnosis not present

## 2019-05-09 DIAGNOSIS — H1789 Other corneal scars and opacities: Secondary | ICD-10-CM | POA: Diagnosis not present

## 2019-05-09 DIAGNOSIS — E119 Type 2 diabetes mellitus without complications: Secondary | ICD-10-CM | POA: Diagnosis not present

## 2019-05-09 DIAGNOSIS — H524 Presbyopia: Secondary | ICD-10-CM | POA: Diagnosis not present

## 2019-05-18 ENCOUNTER — Other Ambulatory Visit: Payer: Self-pay | Admitting: Internal Medicine

## 2019-05-25 DIAGNOSIS — Z0100 Encounter for examination of eyes and vision without abnormal findings: Secondary | ICD-10-CM | POA: Diagnosis not present

## 2019-06-07 ENCOUNTER — Other Ambulatory Visit: Payer: Self-pay | Admitting: Physician Assistant

## 2019-06-28 ENCOUNTER — Ambulatory Visit: Payer: Medicare HMO | Admitting: Internal Medicine

## 2019-06-28 DIAGNOSIS — Z0289 Encounter for other administrative examinations: Secondary | ICD-10-CM

## 2019-07-18 ENCOUNTER — Other Ambulatory Visit: Payer: Self-pay | Admitting: Physician Assistant

## 2019-07-19 MED ORDER — DILTIAZEM HCL ER COATED BEADS 360 MG PO CP24: 360.0000 mg | ORAL_CAPSULE | Freq: Every day | ORAL | 1 refills | Status: DC

## 2019-07-25 ENCOUNTER — Other Ambulatory Visit: Payer: Self-pay | Admitting: Internal Medicine

## 2019-08-21 DIAGNOSIS — G5601 Carpal tunnel syndrome, right upper limb: Secondary | ICD-10-CM | POA: Diagnosis not present

## 2019-08-21 DIAGNOSIS — M13841 Other specified arthritis, right hand: Secondary | ICD-10-CM | POA: Diagnosis not present

## 2019-08-21 DIAGNOSIS — M65311 Trigger thumb, right thumb: Secondary | ICD-10-CM | POA: Diagnosis not present

## 2019-08-27 ENCOUNTER — Other Ambulatory Visit: Payer: Self-pay | Admitting: Internal Medicine

## 2019-08-29 ENCOUNTER — Other Ambulatory Visit: Payer: Self-pay | Admitting: Physician Assistant

## 2019-08-29 DIAGNOSIS — G5602 Carpal tunnel syndrome, left upper limb: Secondary | ICD-10-CM | POA: Diagnosis not present

## 2019-08-29 DIAGNOSIS — G5601 Carpal tunnel syndrome, right upper limb: Secondary | ICD-10-CM | POA: Diagnosis not present

## 2019-08-29 DIAGNOSIS — M65311 Trigger thumb, right thumb: Secondary | ICD-10-CM | POA: Diagnosis not present

## 2019-09-13 DIAGNOSIS — Z4789 Encounter for other orthopedic aftercare: Secondary | ICD-10-CM | POA: Diagnosis not present

## 2019-09-13 DIAGNOSIS — M65311 Trigger thumb, right thumb: Secondary | ICD-10-CM | POA: Diagnosis not present

## 2019-09-13 DIAGNOSIS — G5601 Carpal tunnel syndrome, right upper limb: Secondary | ICD-10-CM | POA: Diagnosis not present

## 2019-10-10 ENCOUNTER — Other Ambulatory Visit: Payer: Self-pay | Admitting: Physician Assistant

## 2019-10-10 ENCOUNTER — Other Ambulatory Visit: Payer: Self-pay | Admitting: Internal Medicine

## 2019-10-18 ENCOUNTER — Encounter (HOSPITAL_COMMUNITY): Payer: Self-pay

## 2019-10-18 ENCOUNTER — Emergency Department (HOSPITAL_COMMUNITY): Payer: Medicare HMO

## 2019-10-18 ENCOUNTER — Emergency Department (HOSPITAL_COMMUNITY)
Admission: EM | Admit: 2019-10-18 | Discharge: 2019-10-18 | Disposition: A | Discharge status: Home / Self Care | Payer: Medicare HMO | Attending: Emergency Medicine | Admitting: Emergency Medicine

## 2019-10-18 DIAGNOSIS — E119 Type 2 diabetes mellitus without complications: Secondary | ICD-10-CM | POA: Diagnosis not present

## 2019-10-18 DIAGNOSIS — Z85038 Personal history of other malignant neoplasm of large intestine: Secondary | ICD-10-CM | POA: Diagnosis not present

## 2019-10-18 DIAGNOSIS — I700 Atherosclerosis of aorta: Secondary | ICD-10-CM | POA: Diagnosis not present

## 2019-10-18 DIAGNOSIS — Z79899 Other long term (current) drug therapy: Secondary | ICD-10-CM | POA: Diagnosis not present

## 2019-10-18 DIAGNOSIS — M533 Sacrococcygeal disorders, not elsewhere classified: Secondary | ICD-10-CM | POA: Diagnosis not present

## 2019-10-18 DIAGNOSIS — M545 Low back pain: Secondary | ICD-10-CM | POA: Diagnosis not present

## 2019-10-18 DIAGNOSIS — Z7982 Long term (current) use of aspirin: Secondary | ICD-10-CM | POA: Insufficient documentation

## 2019-10-18 DIAGNOSIS — M5416 Radiculopathy, lumbar region: Secondary | ICD-10-CM | POA: Diagnosis not present

## 2019-10-18 DIAGNOSIS — Z9049 Acquired absence of other specified parts of digestive tract: Secondary | ICD-10-CM | POA: Diagnosis not present

## 2019-10-18 DIAGNOSIS — Z96652 Presence of left artificial knee joint: Secondary | ICD-10-CM | POA: Diagnosis not present

## 2019-10-18 DIAGNOSIS — J45909 Unspecified asthma, uncomplicated: Secondary | ICD-10-CM | POA: Insufficient documentation

## 2019-10-18 DIAGNOSIS — Z87891 Personal history of nicotine dependence: Secondary | ICD-10-CM | POA: Diagnosis not present

## 2019-10-18 DIAGNOSIS — M79604 Pain in right leg: Secondary | ICD-10-CM | POA: Diagnosis not present

## 2019-10-18 DIAGNOSIS — M5489 Other dorsalgia: Secondary | ICD-10-CM | POA: Diagnosis not present

## 2019-10-18 DIAGNOSIS — M47816 Spondylosis without myelopathy or radiculopathy, lumbar region: Secondary | ICD-10-CM | POA: Diagnosis not present

## 2019-10-18 DIAGNOSIS — Z7951 Long term (current) use of inhaled steroids: Secondary | ICD-10-CM | POA: Insufficient documentation

## 2019-10-18 DIAGNOSIS — I10 Essential (primary) hypertension: Secondary | ICD-10-CM | POA: Insufficient documentation

## 2019-10-18 DIAGNOSIS — R52 Pain, unspecified: Secondary | ICD-10-CM | POA: Diagnosis not present

## 2019-10-18 MED ORDER — OXYCODONE-ACETAMINOPHEN 5-325 MG PO TABS
1.0000 | ORAL_TABLET | Freq: Once | ORAL | Status: AC
  Administered 2019-10-18: 1 via ORAL
  Filled 2019-10-18: qty 1

## 2019-10-18 MED ORDER — PREDNISONE 20 MG PO TABS
60.0000 mg | ORAL_TABLET | Freq: Once | ORAL | Status: AC
  Administered 2019-10-18: 60 mg via ORAL
  Filled 2019-10-18: qty 3

## 2019-10-18 NOTE — Discharge Instructions (Addendum)
Please take your medications at home, as directed.  Recommend that you apply a topical over-the-counter analgesic for your low back pain. You were given narcotic and or sedative medications while in the emergency department. Do not drive. Do not use machinery or power tools. Do not sign legal documents. Do not drink alcohol. Do not take sleeping pills. Do not supervise children by yourself. Do not participate in activities that require climbing or being in high places.  Please go to your orthopedic spine surgeon tomorrow, as scheduled.  Return to the ED or seek immediate medical attention should experience any new or worsening symptoms.

## 2019-10-18 NOTE — ED Triage Notes (Signed)
Pt brought by EMS, from home, reporting that she has back pain that started last week. This morning the pain has gotten progressively worse, leading to her coming in.

## 2019-10-18 NOTE — ED Provider Notes (Addendum)
[facility] DEPT Provider Note   CSN: [id number] Arrival date & time: 10/18/19  1302     History Chief Complaint  Patient presents with  . Back Pain    [name] is a 79 y.o. female with PMH significant for HTN, HLD, fibromyalgia, degenerative joint disease, and chronic back pain who presents to the ED via EMS for severe low back pain with right-sided radicular symptoms.  Patient reports that she has chronic right knee osteoarthritis and that for the past week she has been favoring her right leg.  She suspects that that has caused her to experience her new onset low back pain that feels comparable to her history of herniated disc.  She states that this morning she was having severe pain with any bending, lifting, or twisting.  If she lifts up her right leg, she experience significant pain symptoms.  She states that the pain is completely relieved at rest if she is sitting still.  She denies any obvious precipitating trauma, fevers, chills, recent illness or infection, history of IVDA or malignancy, diaphoresis, abdominal pain, chest pain, history of kidney stones, dysuria, hematuria, incontinence, numbness, weakness, or other focal deficits.  Her daughter spoke with Dr. Melina Schools, local orthopedic spine surgeon, and she has an appointment for 10:45 AM tomorrow.  She is simply requesting pain medicine.  Patient endorses an allergy to morphine has a "made her feel poorly", but has taken Percocet with good effect.  Patient reports that she has leftover pain medication at home from a recent hand surgery in July.  She tried taking it, with little relief.  I reviewed patient's medical record and CT obtained of lumbar spine in 2017 demonstrated multilevel mild neuroforaminal stenosis and facet arthrosis.    HPI     Past Medical History:  Diagnosis Date  . Allergic rhinitis   . Allergy   . Anxiety    and panic attacks;pt states that she is claustrophobic    . Asthma   . Atrial fibrillation ([place])    Used to take Coumadin-instructed to stop  . Bronchitis    in Jan 2013-uses Albuterol prn  . Bronchitis, acute 03-01-11   FINSHED Z-PAK-STILL ON PREDNISONE NOW  . Cataract    bilateral surgery to remove  . Chronic back pain    hx buldging disc  . Colon cancer ([place]) 2007   s/p surgery and chemo  . Constipation    takes Colace prn  . Diabetes mellitus    glimepiride  . Dizziness    occasionally  . DJD (degenerative joint disease)   . Family history of anesthesia complication    daughter gets PONV  . Family hx of colon cancer   . Fibromyalgia    but doesn't take any meds  . Gastric ulcer   . GERD (gastroesophageal reflux disease)    uses Tums prn  . Gout    takes Colchicine prn  . Hemorrhoids   . History of blood clots    right lung in early 90's  . History of colon polyps   . History of migraine headaches    last migraine 73yr ago;Pt states she does get sinus headaches  . History of PSVT (paroxysmal supraventricular tachycardia) no current problems  . History of shingles   . HLD (hyperlipidemia)    diet control - no meds  . Hypertension    takes Micardis and Diltiazem daily  . Nocturia   . Numbness    left hand  .  Osteoarthritis   . Peptic ulcer disease   . Peripheral edema    takes Furosemide every other day  . Peripheral edema    takes Lasix daily  . Rash    on neck and started after she started taking her beta blocker 9yr ago;medical MD has seen this  . Shortness of breath    pt states that she can be sitting as well as exertion and get short of breath  . Sleep apnea    doesn't use CPAP  . Spinal headache   . Urinary frequency   . Urinary frequency   . Yeast infection of the vagina    being treated for this now by dr.fontaine    Patient Active Problem List   Diagnosis Date Noted  . Frequent urination 08/10/2018  . Breast pain, right 04/14/2018  . Leg pain 08/05/2017  . Morbid obesity ([place] 06/02/2016  .  Renal cyst 01/12/2016  . Chronic back pain 05/23/2015  . PAF (paroxysmal atrial fibrillation) ([place] 05/22/2015  . Degeneration of intervertebral disc of lumbosacral region 04/11/2015  . Fibromyalgia 04/11/2015  . Hyperlipidemia associated with type 2 diabetes mellitus ([place] 02/10/2011  . SLEEP APNEA, OBSTRUCTIVE 04/24/2010  . Type 2 diabetes mellitus with hyperlipidemia ([place] 12/25/2008  . History of malignant neoplasm of large intestine 12/25/2008  . Essential hypertension 01/03/2007  . Asthma 01/03/2007    Past Surgical History:  Procedure Laterality Date  . ANKLE SURGERY     tumor-benign removed left ankle  . ANTERIOR CERVICAL DECOMP/DISCECTOMY FUSION  01/12/2011   Procedure: ANTERIOR CERVICAL DECOMPRESSION/DISCECTOMY FUSION 2 LEVELS;  Surgeon: HCooper RenderPool;  [facility];  Service: Neurosurgery;  Laterality: Bilateral;  Cervical five-six, six-seven anterior cervical discectomy and fusion with allograft and plating  . ANTERIOR CERVICAL DECOMP/DISCECTOMY FUSION  06/11/2011   Procedure: ANTERIOR CERVICAL DECOMPRESSION/DISCECTOMY FUSION 1 LEVEL/HARDWARE REMOVAL;  Surgeon: HCharlie Pitter MD;  [facility];  Service: Neurosurgery;  Laterality: N/A;  Cervical four-five anterior cervical decompression fusion with allograft and plating; Removal of Cervical five to seven plate  . bilateral cataracts removed    . caesarean section  1966/69/72   x 3  . CARDIAC CATHETERIZATION  90's and 2005  . CARPAL TUNNEL RELEASE Left 11/2013  . cataract surgery  2010  . COLECTOMY  2007   colon cancer  . COLONOSCOPY    . DILATION AND CURETTAGE OF UTERUS  02-19-11   & POLYP REMOVAL  . GANGLION CYST EXCISION  > [place]   removed from left pointer finger  . HEEL SPUR SURGERY Right 1992   x 2  . HYSTEROSCOPY WITH D & C  02/19/2011   Procedure: DILATATION AND CURETTAGE /HYSTEROSCOPY;  Surgeon: TiAnastasio AuerbachMD;  [facility];  Service: Gynecology;  Laterality: N/A;  requests  one hour  . JOINT REPLACEMENT Left    knee  . knee arthrosocpy     bil;couple of years before knee replacement  . KNEE SURGERY     left TKA  . LUDickeyURGERY  2008  . port a cath placed  2008  . port a cath removed  2008  . pyelonidal cystectomy     at age 79. SHOULDER ARTHROSCOPY WITH SUBACROMIAL DECOMPRESSION AND OPEN ROTATOR C Right 03/16/2013   Procedure: RIGHT SHOULDER ARTHROSCOPY WITH SUBACROMIAL DECOMPRESSION AND OPEN ROTATOR CUFF REPAIR, OPEN DISTAL CLAVICLE  RESECTION POSSIBLE BICEP TENODESIS ;  Surgeon: StAugustin SchoolingMD;  [facility] Service: Orthopedics;  Laterality: Right;  . TRIGGER FINGER RELEASE Left    thumb, tendonitis, tumor removed  . TUBAL LIGATION  1972     OB History    Gravida  3   Para  3   Term      Preterm      AB      Living  3     SAB      TAB      Ectopic      Multiple      Live Births              Family History  Problem Relation Age of Onset  . Heart attack Mother   . Stroke Mother   . Hypertension Mother   . Heart disease Father   . Hypertension Sister   . Kidney cancer Sister        left  . Bladder Cancer Brother   . Liver cancer Brother        spread to colon  . Coronary artery disease Other   . Anesthesia problems Neg Hx   . Hypotension Neg Hx   . Malignant hyperthermia Neg Hx   . Pseudochol deficiency Neg Hx   . Stomach cancer Neg Hx   . Rectal cancer Neg Hx     Social History   Tobacco Use  . Smoking status: Former Smoker    Packs/day: 1.50    Years: 30.00    Pack years: 45.00    Types: Cigarettes    Quit date: 02/23/1991    Years since quitting: 28.6  . Smokeless tobacco: Never Used  Vaping Use  . Vaping Use: Never used  Substance Use Topics  . Alcohol use: No    Alcohol/week: 0.0 standard drinks  . Drug use: No    Home Medications Prior to Admission medications   Medication Sig Start Date End Date Taking? Authorizing Provider  ACCU-CHEK AVIVA PLUS test strip TEST BLOOD SUGAR  TWICE DAILY 10/11/19   Crawford, Elizabeth A, MD  albuterol (PROVENTIL HFA;VENTOLIN HFA) 108 (90 Base) MCG/ACT inhaler Inhale 2 puffs into the lungs every 6 (six) hours as needed for wheezing or shortness of breath. 04/11/18   Crawford, Elizabeth A, MD  aspirin 81 MG tablet Take 81 mg by mouth 2 (two) times a week.    [provider]  baclofen (LIORESAL) 10 MG tablet TAKE 1 TABLET BY MOUTH TWICE A DAY AS NEEDED FOR MUSCLE SPASMS 05/21/19   Crawford, Elizabeth A, MD  Blood Glucose Monitoring Suppl (ACCU-CHEK AVIVA PLUS) w/Device KIT Used to check blood sugars twice daily 08/22/17   Crawford, Elizabeth A, MD  diltiazem (CARDIZEM CD) 240 MG 24 hr capsule Take 1 capsule (240 mg total) by mouth daily. PLEASE CALL [phone] TO SCHEDULE OVERDUE 6 MTH F/U with DR ROSS. Thank you. 08/30/19   Ross, Paula V, MD  diltiazem (CARDIZEM CD) 360 MG 24 hr capsule Take 1 capsule (360 mg total) by mouth daily. 07/19/19   Ross, Paula V, MD  hydrochlorothiazide (HYDRODIURIL) 12.5 MG tablet Take 1 tablet (12.5 mg total) by mouth daily. Please make yearly appt with Dr. Ross for November for future refills. 1st attempt 10/11/19   Ross, Paula V, MD  Lancets (ACCU-CHEK SOFT TOUCH) lancets Use to check blood sugars twice day 08/22/17   Crawford, Elizabeth A, MD  loperamide (IMODIUM) 2 MG capsule Take 4 mg by mouth as needed for diarrhea or loose stools.    [provider]  losartan (COZAAR) 100 MG   tablet Take 1 tablet (100 mg total) by mouth daily. APPOINTMENT NEEDED FOR ADDITIONAL REFILLS 08/28/19   Crawford, Elizabeth A, MD  Promethazine HCl (PHENERGAN PO) Take by mouth as needed.    [provider]  traMADol (ULTRAM) 50 MG tablet TAKE 1 TABLET (50 MG TOTAL) BY MOUTH DAILY AS NEEDED. 01/29/19   Crawford, Elizabeth A, MD    Allergies    Benazepril, Lopressor [metoprolol], Adhesive [tape], Morphine, Nifedipine, and Piroxicam  Review of Systems   Review of Systems  All other systems reviewed and are  negative.   Physical Exam Updated Vital Signs BP (!) 149/81   Pulse 71   Temp 97.9 F (36.6 C) (Oral)   Resp 18   LMP 12/30/2000   SpO2 94%   Physical Exam Vitals and nursing note reviewed. Exam conducted with a chaperone present.  Constitutional:      Appearance: She is obese.     Comments: In obvious discomfort.  HENT:     Head: Normocephalic and atraumatic.  Eyes:     General: No scleral icterus.    Conjunctiva/sclera: Conjunctivae normal.  Cardiovascular:     Rate and Rhythm: Normal rate and regular rhythm.     Pulses: Normal pulses.     Heart sounds: Normal heart sounds.  Pulmonary:     Effort: Pulmonary effort is normal. No respiratory distress.     Breath sounds: Normal breath sounds.  Abdominal:     General: Abdomen is flat. There is no distension.     Palpations: Abdomen is soft.     Tenderness: There is no abdominal tenderness. There is no guarding.  Musculoskeletal:     Cervical back: Normal range of motion. No rigidity.     Comments: Tenderness over lumbar spine, particularly over right-sided paraspinous muscles.  No swelling, redness, warmth, or other overlying skin changes.  Positive right-sided SLR.  Skin:    General: Skin is dry.     Capillary Refill: Capillary refill takes less than 2 seconds.  Neurological:     General: No focal deficit present.     Mental Status: She is alert and oriented to person, place, and time.     GCS: GCS eye subscore is 4. GCS verbal subscore is 5. GCS motor subscore is 6.     Cranial Nerves: No cranial nerve deficit.     Sensory: No sensory deficit.     Motor: No weakness.     Coordination: Coordination normal.     Gait: Gait abnormal.     Comments: Strength of extremities intact against resistance, limited due to pain.  Antalgic gait.  Sensation intact throughout.  Psychiatric:        Mood and Affect: Mood normal.        Behavior: Behavior normal.        Thought Content: Thought content normal.     ED Results /  Procedures / Treatments   Labs (all labs ordered are listed, but only abnormal results are displayed) Labs Reviewed - No data to display  EKG None  Radiology DG Lumbar Spine Complete  Result Date: 10/18/2019 CLINICAL DATA:  Pain EXAM: LUMBAR SPINE - COMPLETE 4+ VIEW COMPARISON:  August thirteenth 2008, December 24, 2015 FINDINGS: There are five non-rib bearing lumbar-type vertebral bodies. There is normal alignment. There is no evidence for acute fracture or subluxation. Intervertebral disc space height loss and endplate proliferative changes most pronounced in the inferior thoracic spine. Lower lumbar facet arthropathy, severe. Atherosclerotic calcifications of the aorta. Status   post cholecystectomy. Degenerative changes of bilateral sacroiliac joints. IMPRESSION: Predominantly facet arthropathy of the lower lumbar spine. Electronically Signed   By: Stephanie  Peacock MD   On: 10/18/2019 15:19    Procedures Procedures (including critical care time)  Medications Ordered in ED Medications  oxyCODONE-acetaminophen (PERCOCET/ROXICET) 5-325 MG per tablet 1 tablet (1 tablet Oral Given 10/18/19 1512)  predniSONE (DELTASONE) tablet 60 mg (60 mg Oral Given 10/18/19 1600)    ED Course  I have reviewed the triage vital signs and the nursing notes.  Pertinent labs & imaging results that were available during my care of the patient were reviewed by me and considered in my medical decision making (see chart for details).    MDM Rules/Calculators/A&P                          Patient's history and physical exam is consistent with lumbar back pain with unilateral radicular pain.  Patient is able to ambulate, albeit with significant antalgia.  No red flags concerning patient's back pain. No s/s of central cord compression or cauda equina.  Pedal pulses intact and symmetric.  Lower extremities are neurovascularly intact and patient is ambulating without difficulty.  Pain control with Percocet and plain  films of lumbar spine despite lack of obvious precipitating trauma since she has her appointment with orthopedic spine surgery tomorrow and she has not had imaging of lumbar spine since 2017.    Plain films of lumbar spine are personally reviewed here today and demonstrate a predominantly facet arthropathy of the lumbar spine.  No obvious concern for acute or emergent pathology.    On final examination, patient is feeling minimally improved.  She is still experiencing low back discomfort with any form of movement.  Will provide one dose of prednisone given patient's low back pain with radicular symptoms.  Patient reportedly has an allergy of hives to Voltaren.  Will recommend over-the-counter topical analgesics.  She reports that she has narcotic medication and muscle relaxants at home.  Patient is accompanied by her daughter who will drive her home.  All of the evaluation and work-up results were discussed with the patient and any family at bedside.  Patient and/or family were informed that while patient is appropriate for discharge at this time, some medical emergencies may only develop or become detectable after a period of time.  I specifically instructed patient and/or family to return to return to the ED or seek immediate medical attention for any new or worsening symptoms.  They were provided opportunity to ask any additional questions and have none at this time.  Prior to discharge patient is feeling well, agreeable with plan for discharge home.  They have expressed understanding of verbal discharge instructions as well as return precautions and are agreeable to the plan.   Patient counseled to never drive or operate heavy machinery while taking narcotic or other sedating medication.   Final Clinical Impression(s) / ED Diagnoses Final diagnoses:  Lumbar back pain with radiculopathy affecting right lower extremity    Rx / DC Orders ED Discharge Orders    None       Green, Garrett L,  PA-C 10/18/19 1502    Green, Garrett L, PA-C 10/18/19 1606    Tegeler, Christopher J, MD 10/18/19 1721  

## 2019-10-19 DIAGNOSIS — M545 Low back pain: Secondary | ICD-10-CM | POA: Diagnosis not present

## 2019-10-19 DIAGNOSIS — M5416 Radiculopathy, lumbar region: Secondary | ICD-10-CM | POA: Diagnosis not present

## 2019-10-26 ENCOUNTER — Other Ambulatory Visit: Payer: Self-pay | Admitting: Internal Medicine

## 2019-11-06 ENCOUNTER — Other Ambulatory Visit: Payer: Self-pay | Admitting: Internal Medicine

## 2019-11-23 ENCOUNTER — Other Ambulatory Visit: Payer: Self-pay | Admitting: Orthopedic Surgery

## 2019-11-23 DIAGNOSIS — M25551 Pain in right hip: Secondary | ICD-10-CM | POA: Diagnosis not present

## 2019-11-23 DIAGNOSIS — M25561 Pain in right knee: Secondary | ICD-10-CM | POA: Diagnosis not present

## 2019-11-23 DIAGNOSIS — M1711 Unilateral primary osteoarthritis, right knee: Secondary | ICD-10-CM | POA: Diagnosis not present

## 2019-12-03 ENCOUNTER — Ambulatory Visit
Admission: RE | Admit: 2019-12-03 | Discharge: 2019-12-03 | Disposition: A | Discharge status: Home / Self Care | Payer: Medicare HMO | Source: Ambulatory Visit | Attending: Orthopedic Surgery | Admitting: Orthopedic Surgery

## 2019-12-03 DIAGNOSIS — M1711 Unilateral primary osteoarthritis, right knee: Secondary | ICD-10-CM | POA: Diagnosis not present

## 2019-12-03 DIAGNOSIS — M25561 Pain in right knee: Secondary | ICD-10-CM

## 2019-12-03 DIAGNOSIS — S82001A Unspecified fracture of right patella, initial encounter for closed fracture: Secondary | ICD-10-CM | POA: Diagnosis not present

## 2019-12-03 DIAGNOSIS — M2548 Effusion, other site: Secondary | ICD-10-CM | POA: Diagnosis not present

## 2019-12-17 ENCOUNTER — Other Ambulatory Visit: Payer: Self-pay | Admitting: Internal Medicine

## 2019-12-19 ENCOUNTER — Other Ambulatory Visit: Payer: Self-pay | Admitting: Physician Assistant

## 2019-12-26 DIAGNOSIS — S82014D Nondisplaced osteochondral fracture of right patella, subsequent encounter for closed fracture with routine healing: Secondary | ICD-10-CM | POA: Diagnosis not present

## 2019-12-26 DIAGNOSIS — M1711 Unilateral primary osteoarthritis, right knee: Secondary | ICD-10-CM | POA: Diagnosis not present

## 2020-01-11 ENCOUNTER — Other Ambulatory Visit: Payer: Self-pay | Admitting: Internal Medicine

## 2020-01-27 NOTE — Progress Notes (Signed)
Cardiology Office Note   Date:  01/28/2020   ID:  [name], DOB [birth year], MRN [id number]  PCP:  Hoyt Koch, MD  Cardiologist:   Dorris Carnes, MD    Pt presents for follow up of PAF and HTN     History of Present Illness: [name] is a 79 y.o. female with a history of PAF (pt refused anticoagulation), AS, HTN, DM, HL, PUD, asthma, gout, OSA, remote PE (1990s)   I saw her in clinic in 2017   She has been seen several times by Kathleen Argue, the last time as a televisit in Nov 2020  The pt says she is still having some afib  Last spell in October  Lasted 6 hours   Took extra 120 mg dilt    BP was normal but HR was up Prior to that, the previous spell  was in the spring  2021. Spells usually start at midnight or 2 AM      BP at home 130/ 70 to 80s    But BP can drop to 80s/  Cant take HCTZ as BP will drop    Pt says she is active at home   Cares for husband  Works in yard  No problems with CP    Breathing is OK     Current Meds  Medication Sig  . ACCU-CHEK AVIVA PLUS test strip TEST BLOOD SUGAR TWICE DAILY  . albuterol (PROVENTIL HFA;VENTOLIN HFA) 108 (90 Base) MCG/ACT inhaler Inhale 2 puffs into the lungs every 6 (six) hours as needed for wheezing or shortness of breath.  . allopurinol (ZYLOPRIM) 100 MG tablet Take 1 tablet by mouth in the morning and at bedtime.  [name] aspirin 81 MG tablet Take 81 mg by mouth 2 (two) times a week.  . baclofen (LIORESAL) 10 MG tablet TAKE 1 TABLET BY MOUTH TWICE A DAY AS NEEDED FOR MUSCLE SPASMS  . Blood Glucose Monitoring Suppl (ACCU-CHEK AVIVA PLUS) w/Device KIT Used to check blood sugars twice daily  . hydrochlorothiazide (MICROZIDE) 12.5 MG capsule Take 12.5 mg by mouth as needed.  . indomethacin (INDOCIN) 25 MG capsule Take 1 capsule by mouth in the morning, at noon, and at bedtime.  . Lancets (ACCU-CHEK SOFT TOUCH) lancets Use to check blood sugars twice day  . loperamide (IMODIUM) 2 MG capsule Take 4 mg by mouth as needed for  diarrhea or loose stools.  [name] losartan (COZAAR) 100 MG tablet Take 1 tablet (100 mg total) by mouth daily. Needs office visit  . Promethazine HCl (PHENERGAN PO) Take by mouth as needed.  . traMADol (ULTRAM) 50 MG tablet TAKE 1 TABLET (50 MG TOTAL) BY MOUTH DAILY AS NEEDED.  . [DISCONTINUED] diltiazem (CARDIZEM CD) 360 MG 24 hr capsule Take 1 capsule (360 mg total) by mouth daily.  . [DISCONTINUED] hydrochlorothiazide (HYDRODIURIL) 12.5 MG tablet Take 1 tablet (12.5 mg total) by mouth daily. Please make yearly appt with Dr. Harrington Challenger for November before anymore refills. Thank you 1st attempt     Allergies:   Benazepril, Lopressor [metoprolol], Adhesive [tape], Morphine, Nifedipine, and Piroxicam   Past Medical History:  Diagnosis Date  . Allergic rhinitis   . Allergy   . Anxiety    and panic attacks;pt states that she is claustrophobic  . Asthma   . Atrial fibrillation ([place])    Used to take Coumadin-instructed to stop  . Bronchitis    in Jan 2013-uses Albuterol prn  . Bronchitis, acute 03-01-11  FINSHED Z-PAK-STILL ON PREDNISONE NOW  . Cataract    bilateral surgery to remove  . Chronic back pain    hx buldging disc  . Colon cancer ([place]) 2007   s/p surgery and chemo  . Constipation    takes Colace prn  . Diabetes mellitus    glimepiride  . Dizziness    occasionally  . DJD (degenerative joint disease)   . Family history of anesthesia complication    daughter gets PONV  . Family hx of colon cancer   . Fibromyalgia    but doesn't take any meds  . Gastric ulcer   . GERD (gastroesophageal reflux disease)    uses Tums prn  . Gout    takes Colchicine prn  . Hemorrhoids   . History of blood clots    right lung in early 90's  . History of colon polyps   . History of migraine headaches    last migraine 61yr ago;Pt states she does get sinus headaches  . History of PSVT (paroxysmal supraventricular tachycardia) no current problems  . History of shingles   . HLD (hyperlipidemia)     diet control - no meds  . Hypertension    takes Micardis and Diltiazem daily  . Nocturia   . Numbness    left hand  . Osteoarthritis   . Peptic ulcer disease   . Peripheral edema    takes Furosemide every other day  . Peripheral edema    takes Lasix daily  . Rash    on neck and started after she started taking her beta blocker 392yrago;medical MD has seen this  . Shortness of breath    pt states that she can be sitting as well as exertion and get short of breath  . Sleep apnea    doesn't use CPAP  . Spinal headache   . Urinary frequency   . Urinary frequency   . Yeast infection of the vagina    being treated for this now by dr.fontaine    Past Surgical History:  Procedure Laterality Date  . ANKLE SURGERY     tumor-benign removed left ankle  . ANTERIOR CERVICAL DECOMP/DISCECTOMY FUSION  01/12/2011   Procedure: ANTERIOR CERVICAL DECOMPRESSION/DISCECTOMY FUSION 2 LEVELS;  Surgeon: HeCooper Renderool;  [facility];  Service: Neurosurgery;  Laterality: Bilateral;  Cervical five-six, six-seven anterior cervical discectomy and fusion with allograft and plating  . ANTERIOR CERVICAL DECOMP/DISCECTOMY FUSION  06/11/2011   Procedure: ANTERIOR CERVICAL DECOMPRESSION/DISCECTOMY FUSION 1 LEVEL/HARDWARE REMOVAL;  Surgeon: HeCharlie PitterMD;  [facility];  Service: Neurosurgery;  Laterality: N/A;  Cervical four-five anterior cervical decompression fusion with allograft and plating; Removal of Cervical five to seven plate  . bilateral cataracts removed    . caesarean section  1966/69/72   x 3  . CARDIAC CATHETERIZATION  90's and 2005  . CARPAL TUNNEL RELEASE Left 11/2013  . cataract surgery  2010  . COLECTOMY  2007   colon cancer  . COLONOSCOPY    . DILATION AND CURETTAGE OF UTERUS  02-19-11   & POLYP REMOVAL  . GANGLION CYST EXCISION  > [age]yrgo   removed from left pointer finger  . HEEL SPUR SURGERY Right 1992   x 2  . HYSTEROSCOPY WITH D & C  02/19/2011   Procedure:  DILATATION AND CURETTAGE /HYSTEROSCOPY;  Surgeon: TimAnastasio AuerbachD;  [facility];  Service: Gynecology;  Laterality: N/A;  requests one hour  . JOINT REPLACEMENT  Left    knee  . knee arthrosocpy     bil;couple of years before knee replacement  . KNEE SURGERY     left TKA  . [place] SURGERY  2008  . port a cath placed  2008  . port a cath removed  2008  . pyelonidal cystectomy     at age 35  . SHOULDER ARTHROSCOPY WITH SUBACROMIAL DECOMPRESSION AND OPEN ROTATOR C Right 03/16/2013   Procedure: RIGHT SHOULDER ARTHROSCOPY WITH SUBACROMIAL DECOMPRESSION AND OPEN ROTATOR CUFF REPAIR, OPEN DISTAL CLAVICLE  RESECTION POSSIBLE BICEP TENODESIS ;  Surgeon: Augustin Schooling, MD;  Location: [facility];  Service: Orthopedics;  Laterality: Right;  . TRIGGER FINGER RELEASE Left    thumb, tendonitis, tumor removed  . TUBAL LIGATION  1972     Social History:  The patient  reports that she quit smoking about 28 years ago. Her smoking use included cigarettes. She has a 45.00 pack-year smoking history. She has never used smokeless tobacco. She reports that she does not drink alcohol and does not use drugs.   Family History:  The patient's family history includes Bladder Cancer in her brother; Coronary artery disease in an other family member; Heart attack in her mother; Heart disease in her father; Hypertension in her mother and sister; Kidney cancer in her sister; Liver cancer in her brother; Stroke in her mother.    ROS:  Please see the history of present illness. All other systems are reviewed and  Negative to the above problem except as noted.    PHYSICAL EXAM: VS:  BP (!) 152/80   Pulse 72   Ht _0  (1.6 m)   Wt 208 lb 6.4 oz (94.5 kg)   LMP 12/30/2000   SpO2 96%   BMI 36.92 kg/m   GEN: Well nourished, well developed, in no acute distress  HEENT: normal  Neck: no JVD, Cardiac: RRR; Gi II/VI systolic murmur LSB to base  No LE  edema  Respiratory:  clear to auscultation bilaterally,  normal work of breathing GI: soft, nontender, nondistended, + BS  No hepatomegaly  MS: no deformity Moving all extremities   Skin: warm and dry, no rash Neuro:  Strength and sensation are intact Psych: euthymic mood, full affect   EKG:  EKG is ordered today.  SR 72 bpm     Lipid Panel    Component Value Date/Time   CHOL 218 (H) 08/10/2018 1457   TRIG 162.0 (H) 08/10/2018 1457   HDL 65.40 08/10/2018 1457   CHOLHDL 3 08/10/2018 1457   VLDL 32.4 08/10/2018 1457   LDLCALC 120 (H) 08/10/2018 1457   LDLDIRECT 123.0 08/03/2017 1031      Wt Readings from Last 3 Encounters:  01/28/20 208 lb 6.4 oz (94.5 kg)  01/01/19 215 lb (97.5 kg)  09/29/18 215 lb (97.5 kg)      ASSESSMENT AND PLAN:  1.  PAF   Pt with a few symptomatic spells in the past year  Limited   She does not want to take anticoagulation  Understands risks  Discussed Will Rx Dilt as 240 and 120 so she can take an extra 120 if needed  2  HTN  BP is a little high, she says it is  better at home.  It is labile    Does not take HCTZ often as her bp is very sensitive to it.     I told her she can cut it in 1/2  And try 6.25 mg if  bp up  3 AV  Pt with very mild gradient across AV in 2017   Murmur is relatively unchnaged    Will follow for now    Prob repeat echo next year  4   HCM  Will check lipids today  Also BMET and CBC    F/U next fall  Sooner fror problems     Current medicines are reviewed at length with the patient today.  The patient does not have concerns regarding medicines.  Signed, Dorris Carnes, MD  01/28/2020 9:02 AM    [address], [place], [place]  [address] Phone: [phone]; Fax: [phone]

## 2020-01-28 ENCOUNTER — Encounter: Payer: Self-pay | Admitting: Internal Medicine

## 2020-01-28 ENCOUNTER — Ambulatory Visit: Payer: Medicare HMO | Admitting: Internal Medicine

## 2020-01-28 ENCOUNTER — Other Ambulatory Visit: Payer: Self-pay

## 2020-01-28 VITALS — BP 152/80 | HR 72 | Ht 63.0 in | Wt 208.4 lb

## 2020-01-28 DIAGNOSIS — E785 Hyperlipidemia, unspecified: Secondary | ICD-10-CM | POA: Diagnosis not present

## 2020-01-28 DIAGNOSIS — I480 Paroxysmal atrial fibrillation: Secondary | ICD-10-CM

## 2020-01-28 DIAGNOSIS — E1169 Type 2 diabetes mellitus with other specified complication: Secondary | ICD-10-CM | POA: Diagnosis not present

## 2020-01-28 DIAGNOSIS — I10 Essential (primary) hypertension: Secondary | ICD-10-CM

## 2020-01-28 LAB — LIPID PANEL

## 2020-01-28 MED ORDER — DILTIAZEM HCL ER COATED BEADS 120 MG PO CP24
120.0000 mg | ORAL_CAPSULE | Freq: Every day | ORAL | 3 refills | Status: DC
Start: 2020-01-28 — End: 2020-04-21

## 2020-01-28 MED ORDER — DILTIAZEM HCL ER COATED BEADS 240 MG PO CP24: 240.0000 mg | ORAL_CAPSULE | Freq: Every day | ORAL | 3 refills | Status: DC

## 2020-01-28 NOTE — Patient Instructions (Signed)
Medication Instructions:  Continue taking Cardizem CD 360 mg.  We changed your prescription to  One 240 mg tablet and  One 120 mg tablet  Okay to take an extra 120 mg tablet as needed for breakthrough afib episodes.   *If you need a refill on your cardiac medications before your next appointment, please call your pharmacy*   Lab Work: Today: bmet, cbc, lipids  If you have labs (blood work) drawn today and your tests are completely normal, you will receive your results only by: [name] MyChart Message (if you have MyChart) OR . A paper copy in the mail If you have any lab test that is abnormal or we need to change your treatment, we will call you to review the results.   Testing/Procedures: none   Follow-Up: At [facility], you and your health needs are our priority.  As part of our continuing mission to provide you with exceptional heart care, we have created designated Provider Care Teams.  These Care Teams include your primary Cardiologist (physician) and Advanced Practice Providers (APPs -  Physician Assistants and Nurse Practitioners) who all work together to provide you with the care you need, when you need it.  Your next appointment:   10 month(s)  The format for your next appointment:   In Person  Provider:   You may see Dorris Carnes, MD or one of the following Advanced Practice Providers on your designated Care Team:    Richardson Dopp, PA-C  Robbie Lis, [place]   Other Instructions

## 2020-01-29 LAB — LIPID PANEL
Chol/HDL Ratio: 3.6 ratio (ref 0.0–4.4)
Cholesterol, Total: 210 mg/dL — ABNORMAL HIGH (ref 100–199)
HDL: 58 mg/dL (ref >39)
LDL Chol Calc (NIH): 122 mg/dL — ABNORMAL HIGH (ref 0–99)
Triglycerides: 169 mg/dL — ABNORMAL HIGH (ref 0–149)
VLDL Cholesterol Cal: 30 mg/dL (ref 5–40)

## 2020-01-29 LAB — CBC
Hematocrit: 40.4 % (ref 34.0–46.6)
Hemoglobin: 13.4 g/dL (ref 11.1–15.9)
MCH: 29.1 pg (ref 26.6–33.0)
MCHC: 33.2 g/dL (ref 31.5–35.7)
MCV: 88 fL (ref 79–97)
Platelets: 276 10*3/uL (ref 150–450)
RBC: 4.6 x10E6/uL (ref 3.77–5.28)
RDW: 13 % (ref 11.7–15.4)
WBC: 8.2 10*3/uL (ref 3.4–10.8)

## 2020-01-29 LAB — BASIC METABOLIC PANEL
BUN/Creatinine Ratio: 18 (ref 12–28)
BUN: 18 mg/dL (ref 8–27)
CO2: 22 mmol/L (ref 20–29)
Calcium: 9.6 mg/dL (ref 8.7–10.3)
Chloride: 103 mmol/L (ref 96–106)
Creatinine, Ser: 1 mg/dL (ref 0.57–1.00)
GFR calc Af Amer: 63 mL/min/{1.73_m2} (ref >59)
GFR calc non Af Amer: 54 mL/min/{1.73_m2} — ABNORMAL LOW (ref >59)
Glucose: 145 mg/dL — ABNORMAL HIGH (ref 65–99)
Potassium: 4.8 mmol/L (ref 3.5–5.2)
Sodium: 140 mmol/L (ref 134–144)

## 2020-01-30 ENCOUNTER — Other Ambulatory Visit: Payer: Self-pay | Admitting: Internal Medicine

## 2020-01-31 DIAGNOSIS — M79671 Pain in right foot: Secondary | ICD-10-CM | POA: Diagnosis not present

## 2020-02-04 ENCOUNTER — Other Ambulatory Visit: Payer: Self-pay

## 2020-02-04 ENCOUNTER — Ambulatory Visit (INDEPENDENT_AMBULATORY_CARE_PROVIDER_SITE_OTHER): Payer: Medicare HMO

## 2020-02-04 ENCOUNTER — Ambulatory Visit (INDEPENDENT_AMBULATORY_CARE_PROVIDER_SITE_OTHER): Payer: Medicare HMO | Admitting: Internal Medicine

## 2020-02-04 ENCOUNTER — Encounter: Payer: Self-pay | Admitting: Internal Medicine

## 2020-02-04 VITALS — BP 140/80 | HR 67 | Temp 98.1°F | Ht 63.0 in | Wt 205.8 lb

## 2020-02-04 DIAGNOSIS — M79671 Pain in right foot: Secondary | ICD-10-CM | POA: Diagnosis not present

## 2020-02-04 DIAGNOSIS — M25551 Pain in right hip: Secondary | ICD-10-CM

## 2020-02-04 DIAGNOSIS — E1169 Type 2 diabetes mellitus with other specified complication: Secondary | ICD-10-CM

## 2020-02-04 DIAGNOSIS — E785 Hyperlipidemia, unspecified: Secondary | ICD-10-CM

## 2020-02-04 LAB — LIPID PANEL
Cholesterol: 236 mg/dL — ABNORMAL HIGH (ref 0–200)
HDL: 59.2 mg/dL (ref >39.00)
LDL Cholesterol: 139 mg/dL — ABNORMAL HIGH (ref 0–99)
NonHDL: 177.14
Total CHOL/HDL Ratio: 4
Triglycerides: 190 mg/dL — ABNORMAL HIGH (ref 0.0–149.0)
VLDL: 38 mg/dL (ref 0.0–40.0)

## 2020-02-04 LAB — CBC
HCT: 43 % (ref 36.0–46.0)
Hemoglobin: 14.5 g/dL (ref 12.0–15.0)
MCHC: 33.8 g/dL (ref 30.0–36.0)
MCV: 87 fl (ref 78.0–100.0)
Platelets: 324 10*3/uL (ref 150.0–400.0)
RBC: 4.95 Mil/uL (ref 3.87–5.11)
RDW: 14 % (ref 11.5–15.5)
WBC: 11 10*3/uL — ABNORMAL HIGH (ref 4.0–10.5)

## 2020-02-04 LAB — COMPREHENSIVE METABOLIC PANEL
ALT: 10 U/L (ref 0–35)
AST: 15 U/L (ref 0–37)
Albumin: 4.4 g/dL (ref 3.5–5.2)
Alkaline Phosphatase: 100 U/L (ref 39–117)
BUN: 26 mg/dL — ABNORMAL HIGH (ref 6–23)
CO2: 28 mEq/L (ref 19–32)
Calcium: 9.9 mg/dL (ref 8.4–10.5)
Chloride: 102 mEq/L (ref 96–112)
Creatinine, Ser: 1.25 mg/dL — ABNORMAL HIGH (ref 0.40–1.20)
GFR: 41.49 mL/min — ABNORMAL LOW (ref >60.00)
Glucose, Bld: 142 mg/dL — ABNORMAL HIGH (ref 70–99)
Potassium: 4.1 mEq/L (ref 3.5–5.1)
Sodium: 139 mEq/L (ref 135–145)
Total Bilirubin: 0.5 mg/dL (ref 0.2–1.2)
Total Protein: 7.3 g/dL (ref 6.0–8.3)

## 2020-02-04 LAB — HEMOGLOBIN A1C: Hgb A1c MFr Bld: 7.1 % — ABNORMAL HIGH (ref 4.6–6.5)

## 2020-02-04 LAB — URIC ACID: Uric Acid, Serum: 4.5 mg/dL (ref 2.4–7.0)

## 2020-02-04 MED ORDER — PREDNISONE 20 MG PO TABS: 40.0000 mg | ORAL_TABLET | Freq: Every day | ORAL | 0 refills | Status: DC

## 2020-02-04 MED ORDER — TRAMADOL HCL 50 MG PO TABS: 50.0000 mg | ORAL_TABLET | Freq: Every day | ORAL | 1 refills | Status: DC | PRN

## 2020-02-04 NOTE — Progress Notes (Signed)
Subjective:   Patient ID: [name], female    DOB: 1940-09-20, 79 y.o.   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 She did have fall back in September and these issues have been going on since that time. She has seen ortho for the knee and they are treating her kind of. She had 3 fractures in the knee. Denies falls since but having significant pain and mobility issues. She was concerned about gout in the right side and she called in ortho and over the phone given indomethacin for 1 month, allopurinol for 1 month and some hydrocodone. She took these all and now is off all of them for about 1 week. Still feels like she is having some gout in the leg.  Review of Systems  Constitutional: Positive for activity change. Negative for appetite change, chills, fatigue, fever and unexpected weight change.  Respiratory: Negative.   Cardiovascular: Negative.   Gastrointestinal:  Negative.   Musculoskeletal: Positive for arthralgias, back pain, gait problem and myalgias. Negative for joint swelling, neck pain and neck stiffness.  Skin: Negative.   Neurological: Negative for weakness.    Objective:  Physical Exam Constitutional:      Appearance: She is well-developed and well-nourished. She is obese.  HENT:     Head: Normocephalic and atraumatic.  Eyes:     Extraocular Movements: EOM normal.  Cardiovascular:     Rate and Rhythm: Normal rate and regular rhythm.  Pulmonary:     Effort: Pulmonary effort is normal. No respiratory distress.     Breath sounds: Normal breath sounds. No wheezing or rales.  Abdominal:     General: Bowel sounds are normal. There is no distension.     Palpations: Abdomen is soft.     Tenderness: There is no abdominal tenderness. There is no rebound.  Musculoskeletal:        General: Swelling and tenderness present. No edema.     Cervical back: Normal range of motion.     Comments: Right knee with swelling and pain, right foot with pain midfoot on the top as well as laterally in the midfoot. Right trochanteric region with tenderness to palpation, right SI region severe tenderness  Skin:    General: Skin is warm and dry.  Neurological:     Mental Status: She is alert and oriented to person, place, and time.     Coordination: Coordination  normal.  Psychiatric:        Mood and Affect: Mood and affect normal.     Vitals:   02/04/20 0851  BP: 140/80  Pulse: 67  Temp: 98.1 F (36.7 C)  TempSrc: Oral  SpO2: 97%  Weight: 205 lb 12.8 oz (93.4 kg)  Height: 5\' 3"  (1.6 m)    This visit occurred during the SARS-CoV-2 public health emergency.  Safety protocols were in place, including screening questions prior to the visit, additional usage of staff PPE, and extensive cleaning of exam room while observing appropriate contact time as indicated for disinfecting solutions.   Assessment & Plan:  Visit time 32 minutes in face to face  communication with patient and coordination of care, additional 10 minutes spent in record review, coordination or care, ordering tests, communicating/referring to other healthcare professionals, documenting in medical records all on the same day of the visit for total time 42 minutes spent on the visit.

## 2020-02-04 NOTE — Assessment & Plan Note (Signed)
It is unclear if this represents gout. Will rx prednisone to see if this helps. Checking uric acid as she is not in clear flare today. She did just take some dose of allopurinol for 1 month. Checking x-ray foot. Could be related to knee and hip problems as well.

## 2020-02-04 NOTE — Patient Instructions (Addendum)
We have sent in the refill of the tramadol.   We have sent in prednisone to take 2 pills daily for 1 week then stop.   We are checking the x-rays of the hip and foot to check for problems.

## 2020-02-04 NOTE — Assessment & Plan Note (Addendum)
Needs labs but did not discuss treatment or management of her diabetes today and she is asked to return for discussion and management. Foot exam done

## 2020-02-04 NOTE — Assessment & Plan Note (Signed)
Labs ordered but did not discuss today. She is not taking pravastatin which was prescribed after last lipid panel for some reason. Needs follow up to discuss as she has significant CV risk.

## 2020-02-04 NOTE — Assessment & Plan Note (Signed)
Checking x-ray as she has not had this since fall 3 months ago and significant pain on exam. It is unclear if her knee issues could be contributing to this pain. Rx prednisone to see if this is trochanteric bursitis if it may resolve or improve.

## 2020-02-11 ENCOUNTER — Other Ambulatory Visit: Payer: Self-pay | Admitting: Internal Medicine

## 2020-02-19 ENCOUNTER — Telehealth: Payer: Self-pay | Admitting: Internal Medicine

## 2020-02-19 DIAGNOSIS — L608 Other nail disorders: Secondary | ICD-10-CM

## 2020-02-19 NOTE — Telephone Encounter (Signed)
Patient states during her last visit they talked about a referral to podiatry so she was wondering if we could put that referral in.  [phone]

## 2020-02-20 NOTE — Telephone Encounter (Signed)
Done

## 2020-02-20 NOTE — Telephone Encounter (Signed)
Pt informed of below.  

## 2020-02-25 ENCOUNTER — Ambulatory Visit (INDEPENDENT_AMBULATORY_CARE_PROVIDER_SITE_OTHER): Payer: Medicare HMO

## 2020-02-25 ENCOUNTER — Ambulatory Visit (INDEPENDENT_AMBULATORY_CARE_PROVIDER_SITE_OTHER): Payer: Medicare Other | Admitting: Podiatry

## 2020-02-25 ENCOUNTER — Other Ambulatory Visit: Payer: Self-pay

## 2020-02-25 DIAGNOSIS — S99921A Unspecified injury of right foot, initial encounter: Secondary | ICD-10-CM

## 2020-02-25 DIAGNOSIS — M778 Other enthesopathies, not elsewhere classified: Secondary | ICD-10-CM

## 2020-02-25 DIAGNOSIS — S93324A Dislocation of tarsometatarsal joint of right foot, initial encounter: Secondary | ICD-10-CM | POA: Diagnosis not present

## 2020-02-25 NOTE — Progress Notes (Signed)
HPI: 80 y.o. female presenting today as a new patient for evaluation of right foot pain is been going on for approximately 3 months now.  Patient states that towards the end of September/beginning of October she sustained a trip and fall injury at her home.  She fractured her knee in 3 places and sustained injuries to her right foot and hip as well.  Patient states that since that injury her right foot has been severely painful and swollen despite x-rays that were negative for any fracture or dislocation.  She has been taking tramadol for the pain otherwise she has not done anything for treatment for her right foot pain.  She presents for further treatment and evaluation.  Past Medical History:  Diagnosis Date  . Allergic rhinitis   . Allergy   . Anxiety    and panic attacks;pt states that she is claustrophobic  . Asthma   . Atrial fibrillation ([place])    Used to take Coumadin-instructed to stop  . Bronchitis    in Jan 2013-uses Albuterol prn  . Bronchitis, acute 03-01-11   FINSHED Z-PAK-STILL ON PREDNISONE NOW  . Cataract    bilateral surgery to remove  . Chronic back pain    hx buldging disc  . Colon cancer ([place]) 2007   s/p surgery and chemo  . Constipation    takes Colace prn  . Diabetes mellitus    glimepiride  . Dizziness    occasionally  . DJD (degenerative joint disease)   . Family history of anesthesia complication    daughter gets PONV  . Family hx of colon cancer   . Fibromyalgia    but doesn't take any meds  . Gastric ulcer   . GERD (gastroesophageal reflux disease)    uses Tums prn  . Gout    takes Colchicine prn  . Hemorrhoids   . History of blood clots    right lung in early 90's  . History of colon polyps   . History of migraine headaches    last migraine 68yrs ago;Pt states she does get sinus headaches  . History of PSVT (paroxysmal supraventricular tachycardia) no current problems  . History of shingles   . HLD (hyperlipidemia)    diet control - no meds   . Hypertension    takes Micardis and Diltiazem daily  . Nocturia   . Numbness    left hand  . Osteoarthritis   . Peptic ulcer disease   . Peripheral edema    takes Furosemide every other day  . Peripheral edema    takes Lasix daily  . Rash    on neck and started after she started taking her beta blocker 40yrs ago;medical MD has seen this  . Shortness of breath    pt states that she can be sitting as well as exertion and get short of breath  . Sleep apnea    doesn't use CPAP  . Spinal headache   . Urinary frequency   . Urinary frequency   . Yeast infection of the vagina    being treated for this now by dr.fontaine     Physical Exam: General: The patient is alert and oriented x3 in no acute distress.  Dermatology: Skin is warm, dry and supple bilateral lower extremities. Negative for open lesions or macerations.  Vascular: Palpable pedal pulses bilaterally.  Moderate edema noted to the right midfoot. Capillary refill within normal limits.  Neurological: Epicritic and protective threshold grossly intact bilaterally.   Musculoskeletal Exam:  Range of motion within normal limits to all pedal and ankle joints bilateral. Muscle strength 5/5 in all groups bilateral.  Significant tenderness and pain with palpation to the right midfoot  Radiographic Exam:  Normal osseous mineralization. Joint spaces preserved. No fracture/dislocation/boney destruction.    Assessment: 1.  Suspect Lisfranc fracture dislocation right foot   Plan of Care:  1. Patient evaluated. X-Rays reviewed.  2.  Today we are going to order MRI right foot.  Patient has history of injury and x-rays are negative.  She continues to be in severe pain with swelling of the foot 3 months after injury 3.  Continue tramadol for pain as per prescribing physician 4.  Return to clinic after MRI to review results and discuss further treatment options      Felecia Shelling, DPM [facility]  Dr. Felecia Shelling,  DPM    [address] [place], [place] [address]                Office [phone]  Fax [phone]

## 2020-02-27 ENCOUNTER — Telehealth: Payer: Self-pay | Admitting: Internal Medicine

## 2020-02-27 NOTE — Telephone Encounter (Signed)
Patient calling asking if she could be put on a fluid pill for her leg injury and swelling, has made an appointment with podiatry about this. Patient states she used to be on fluid medication but never got it refilled.

## 2020-02-28 NOTE — Telephone Encounter (Signed)
Would recommend visit to discuss.  

## 2020-02-28 NOTE — Telephone Encounter (Signed)
Pt informed of below.  OV is scheduled 03/03/20 w/ PCP.

## 2020-03-03 ENCOUNTER — Ambulatory Visit (INDEPENDENT_AMBULATORY_CARE_PROVIDER_SITE_OTHER): Payer: Medicare HMO | Admitting: Internal Medicine

## 2020-03-03 ENCOUNTER — Other Ambulatory Visit: Payer: Self-pay

## 2020-03-03 ENCOUNTER — Encounter: Payer: Self-pay | Admitting: Internal Medicine

## 2020-03-03 VITALS — BP 142/78 | HR 71 | Temp 98.7°F | Wt 201.6 lb

## 2020-03-03 DIAGNOSIS — R1084 Generalized abdominal pain: Secondary | ICD-10-CM | POA: Diagnosis not present

## 2020-03-03 DIAGNOSIS — M79671 Pain in right foot: Secondary | ICD-10-CM

## 2020-03-03 DIAGNOSIS — R110 Nausea: Secondary | ICD-10-CM

## 2020-03-03 DIAGNOSIS — Z85038 Personal history of other malignant neoplasm of large intestine: Secondary | ICD-10-CM

## 2020-03-03 LAB — COMPREHENSIVE METABOLIC PANEL
ALT: 9 U/L (ref 0–35)
AST: 13 U/L (ref 0–37)
Albumin: 4.3 g/dL (ref 3.5–5.2)
Alkaline Phosphatase: 82 U/L (ref 39–117)
BUN: 23 mg/dL (ref 6–23)
CO2: 27 mEq/L (ref 19–32)
Calcium: 9.6 mg/dL (ref 8.4–10.5)
Chloride: 104 mEq/L (ref 96–112)
Creatinine, Ser: 1.35 mg/dL — ABNORMAL HIGH (ref 0.40–1.20)
GFR: 37.81 mL/min — ABNORMAL LOW (ref >60.00)
Glucose, Bld: 143 mg/dL — ABNORMAL HIGH (ref 70–99)
Potassium: 4 mEq/L (ref 3.5–5.1)
Sodium: 138 mEq/L (ref 135–145)
Total Bilirubin: 0.6 mg/dL (ref 0.2–1.2)
Total Protein: 6.9 g/dL (ref 6.0–8.3)

## 2020-03-03 MED ORDER — FAMOTIDINE 20 MG PO TABS: 20.0000 mg | ORAL_TABLET | Freq: Two times a day (BID) | ORAL | 0 refills | Status: DC

## 2020-03-03 NOTE — Progress Notes (Signed)
   Subjective:   Patient ID: [name], female    DOB: 09/28/1940, 80 y.o.   MRN: [id number]  HPI The patient is a 80 YO female coming in for several concerns including nausea after eating (most of the time for several months, she does have a prominence around her belly button which bulges out when sitting up in bed, denies pain to the area, she has had several surgeries on her stomach) and right ankle swelling (started with fall/accident in the fall, knee with fracture in patella, she had pain starting then and swelling, x-ray done with orthopedic, recently seen podiatry and they have ordered MRI which is to be done later this week, she is still having some swelling and had requested fluid pill over the phone) and prior colon cancer (she is getting some vague pains in the left upper abdomen and nausea after eating, there is a strong family history of cancer, she is concerned about this again, known renal and ovarian cysts she is concerned about).   Review of Systems  Constitutional: Negative.   HENT: Negative.   Eyes: Negative.   Respiratory: Negative for cough, chest tightness and shortness of breath.   Cardiovascular: Negative for chest pain, palpitations and leg swelling.  Gastrointestinal: Positive for abdominal distention, abdominal pain and nausea. Negative for constipation, diarrhea and vomiting.  Musculoskeletal: Negative.   Skin: Negative.   Neurological: Negative.   Psychiatric/Behavioral: Negative.     Objective:  Physical Exam Constitutional:      Appearance: She is well-developed and well-nourished. She is obese.  HENT:     Head: Normocephalic and atraumatic.  Eyes:     Extraocular Movements: EOM normal.  Cardiovascular:     Rate and Rhythm: Normal rate and regular rhythm.  Pulmonary:     Effort: Pulmonary effort is normal. No respiratory distress.     Breath sounds: Normal breath sounds. No wheezing or rales.  Abdominal:     General: Bowel sounds are normal. There  is no distension.     Palpations: Abdomen is soft.     Tenderness: There is abdominal tenderness. There is no rebound.     Hernia: A hernia is present.     Comments: Tenderness LUQ and hernia detected paraumbilical without incarceration  Musculoskeletal:        General: Tenderness present. No edema.     Cervical back: Normal range of motion.  Skin:    General: Skin is warm and dry.  Neurological:     Mental Status: She is alert and oriented to person, place, and time.     Coordination: Coordination normal.  Psychiatric:        Mood and Affect: Mood and affect normal.     Vitals:   03/03/20 1032  BP: (!) 142/78  Pulse: 71  Temp: 98.7 F (37.1 C)  TempSrc: Oral  SpO2: 97%  Weight: 201 lb 9.6 oz (91.4 kg)    This visit occurred during the SARS-CoV-2 public health emergency.  Safety protocols were in place, including screening questions prior to the visit, additional usage of staff PPE, and extensive cleaning of exam room while observing appropriate contact time as indicated for disinfecting solutions.   Assessment & Plan:

## 2020-03-03 NOTE — Assessment & Plan Note (Signed)
Needs CT abdomen and pelvis for new abdominal complaints. She is up to date on colonoscopy with most recent in 2019 and due in 2024.

## 2020-03-03 NOTE — Assessment & Plan Note (Signed)
Checking CT abdomen and pelvis to rule out diverticulitis versus other etiology. She is concerned about follow up of renal and ovarian cysts which otherwise would not qualify for repeat imaging.

## 2020-03-03 NOTE — Patient Instructions (Addendum)
We will check the blood work today and get a CT scan of the stomach and pelvis to check out the nausea. This will check on the cysts in the kidney and ovaries as well.   We will try pepcid twice a day for 2-3 weeks to see if this helps with the nausea.

## 2020-03-03 NOTE — Assessment & Plan Note (Signed)
Could be related to GERD versus umbilical hernia. Try pepcid 20 mg BID for 2-3 weeks.

## 2020-03-03 NOTE — Assessment & Plan Note (Signed)
Advised to continue with work up with podiatry rather than fluid pills, the swelling on exam is not enough to warrant this treatment. This may be permanent or may resolve with treatment of underling foot/ankle problem. Keep MRI this week.

## 2020-03-05 ENCOUNTER — Telehealth: Payer: Self-pay | Admitting: Internal Medicine

## 2020-03-05 ENCOUNTER — Ambulatory Visit (INDEPENDENT_AMBULATORY_CARE_PROVIDER_SITE_OTHER): Payer: Medicare HMO | Admitting: Internal Medicine

## 2020-03-05 ENCOUNTER — Other Ambulatory Visit: Payer: Self-pay | Admitting: Internal Medicine

## 2020-03-05 ENCOUNTER — Ambulatory Visit (INDEPENDENT_AMBULATORY_CARE_PROVIDER_SITE_OTHER): Payer: Medicare HMO

## 2020-03-05 ENCOUNTER — Other Ambulatory Visit: Payer: Self-pay

## 2020-03-05 ENCOUNTER — Encounter: Payer: Self-pay | Admitting: Internal Medicine

## 2020-03-05 VITALS — BP 126/76 | HR 65 | Temp 97.6°F | Resp 16 | Ht 63.0 in

## 2020-03-05 DIAGNOSIS — M79672 Pain in left foot: Secondary | ICD-10-CM | POA: Diagnosis not present

## 2020-03-05 DIAGNOSIS — M109 Gout, unspecified: Secondary | ICD-10-CM

## 2020-03-05 DIAGNOSIS — M7989 Other specified soft tissue disorders: Secondary | ICD-10-CM | POA: Insufficient documentation

## 2020-03-05 DIAGNOSIS — M79675 Pain in left toe(s): Secondary | ICD-10-CM | POA: Insufficient documentation

## 2020-03-05 MED ORDER — METHYLPREDNISOLONE ACETATE 80 MG/ML IJ SUSP: 80.0000 mg | Freq: Once | INTRAMUSCULAR | Status: DC

## 2020-03-05 MED ORDER — OXYCODONE HCL 5 MG PO TABS: 5.0000 mg | ORAL_TABLET | Freq: Four times a day (QID) | ORAL | 0 refills | Status: AC | PRN

## 2020-03-05 MED ORDER — KETOROLAC TROMETHAMINE 60 MG/2ML IM SOLN
60.0000 mg | Freq: Once | INTRAMUSCULAR | Status: AC
  Administered 2020-03-05: 60 mg via INTRAMUSCULAR

## 2020-03-05 MED ORDER — METHYLPREDNISOLONE ACETATE 80 MG/ML IJ SUSP
120.0000 mg | Freq: Once | INTRAMUSCULAR | Status: AC
  Administered 2020-03-05: 120 mg via INTRAMUSCULAR

## 2020-03-05 MED ORDER — COLCHICINE 0.6 MG PO CAPS: 1.0000 | ORAL_CAPSULE | Freq: Two times a day (BID) | ORAL | 0 refills | Status: DC

## 2020-03-05 NOTE — Progress Notes (Signed)
Subjective:  Patient ID: [name], female    DOB: [birth year]  Age: 80 y.o. MRN: [id number]  CC: Foot Swelling  This visit occurred during the SARS-CoV-2 public health emergency.  Safety protocols were in place, including screening questions prior to the visit, additional usage of staff PPE, and extensive cleaning of exam room while observing appropriate contact time as indicated for disinfecting solutions.    HPI [name] presents for concern about her left foot.  She has a history of gout and renal insufficiency.  She woke up today with acute onset of pain and swelling on the dorsum of her left midfoot.  There is no history of trauma or injury.  She has not gotten much symptom relief with tramadol.  Outpatient Medications Prior to Visit  Medication Sig Dispense Refill   albuterol (PROVENTIL HFA;VENTOLIN HFA) 108 (90 Base) MCG/ACT inhaler Inhale 2 puffs into the lungs every 6 (six) hours as needed for wheezing or shortness of breath. 1 Inhaler 1   aspirin 81 MG tablet Take 81 mg by mouth 2 (two) times a week.     baclofen (LIORESAL) 10 MG tablet TAKE 1 TABLET BY MOUTH TWICE A DAY AS NEEDED FOR MUSCLE SPASMS 180 tablet 0   Blood Glucose Monitoring Suppl (ACCU-CHEK AVIVA PLUS) w/Device KIT Used to check blood sugars twice daily 1 kit 0   diltiazem (CARDIZEM CD) 120 MG 24 hr capsule Take 1 capsule (120 mg total) by mouth daily. Take in addition to diltiazem 240 mg for a total of 360 mg daily.  May take one additional tablet as needed for breakthrough afib 95 capsule 3   diltiazem (CARDIZEM CD) 240 MG 24 hr capsule Take 1 capsule (240 mg total) by mouth daily. Take in addition to diltiazem 120 for total of 360 mg daily 90 capsule 3   famotidine (PEPCID) 20 MG tablet Take 1 tablet (20 mg total) by mouth 2 (two) times daily. 60 tablet 0   hydrochlorothiazide (MICROZIDE) 12.5 MG capsule Take 12.5 mg by mouth as needed.     Lancets (ACCU-CHEK SOFT TOUCH) lancets Use to check blood  sugars twice day 300 each 1   loperamide (IMODIUM) 2 MG capsule Take 4 mg by mouth as needed for diarrhea or loose stools.     losartan (COZAAR) 100 MG tablet TAKE 1 TABLET EVERY DAY (NEED MD APPOINTMENT FOR REFILLS) 30 tablet 0   traMADol (ULTRAM) 50 MG tablet Take 1 tablet (50 mg total) by mouth daily as needed. 30 tablet 1   ACCU-CHEK AVIVA PLUS test strip TEST BLOOD SUGAR TWICE DAILY 200 strip 1   No facility-administered medications prior to visit.    ROS Review of Systems  Constitutional: Positive for chills. Negative for fatigue and fever.  HENT: Negative.   Respiratory: Negative for cough, chest tightness and wheezing.   Cardiovascular: Negative for chest pain, palpitations and leg swelling.  Gastrointestinal: Negative for abdominal pain, constipation, diarrhea, nausea and vomiting.  Endocrine: Negative.   Genitourinary: Negative.   Musculoskeletal: Positive for arthralgias. Negative for back pain and myalgias.  Skin: Negative.  Negative for rash.  Neurological: Negative.  Negative for dizziness, weakness, light-headedness and headaches.  Hematological: Negative for adenopathy. Does not bruise/bleed easily.  Psychiatric/Behavioral: Negative.     Objective:  BP 126/76    Pulse 65    Temp 97.6 F (36.4 C) (Oral)    Resp 16    Ht 5' 3"  (1.6 m)    LMP 12/30/2000  SpO2 98%    BMI 35.71 kg/m   BP Readings from Last 3 Encounters:  03/05/20 126/76  03/03/20 (!) 142/78  02/04/20 140/80    Wt Readings from Last 3 Encounters:  03/03/20 201 lb 9.6 oz (91.4 kg)  02/04/20 205 lb 12.8 oz (93.4 kg)  01/28/20 208 lb 6.4 oz (94.5 kg)    Physical Exam Vitals reviewed.  Constitutional:      Appearance: She is ill-appearing (in a wheelchair).  HENT:     Nose: Nose normal.     Mouth/Throat:     Mouth: Mucous membranes are moist.  Eyes:     General: No scleral icterus.    Conjunctiva/sclera: Conjunctivae normal.  Cardiovascular:     Rate and Rhythm: Normal rate and  regular rhythm.     Heart sounds: No murmur heard.   Pulmonary:     Effort: Pulmonary effort is normal.     Breath sounds: No stridor. No wheezing, rhonchi or rales.  Abdominal:     General: Abdomen is protuberant. Bowel sounds are normal. There is no distension.     Palpations: Abdomen is soft. There is no hepatomegaly, splenomegaly or mass.  Musculoskeletal:        General: Normal range of motion.     Cervical back: Neck supple.     Right lower leg: No edema.       Feet:  Skin:    General: Skin is warm and dry.  Neurological:     General: No focal deficit present.     Mental Status: She is alert.  Psychiatric:        Mood and Affect: Mood normal.        Behavior: Behavior normal.     Lab Results  Component Value Date   WBC 11.0 (H) 02/04/2020   HGB 14.5 02/04/2020   HCT 43.0 02/04/2020   PLT 324.0 02/04/2020   GLUCOSE 143 (H) 03/03/2020   CHOL 236 (H) 02/04/2020   TRIG 190.0 (H) 02/04/2020   HDL 59.20 02/04/2020   LDLDIRECT 123.0 08/03/2017   LDLCALC 139 (H) 02/04/2020   ALT 9 03/03/2020   AST 13 03/03/2020   NA 138 03/03/2020   K 4.0 03/03/2020   CL 104 03/03/2020   CREATININE 1.35 (H) 03/03/2020   BUN 23 03/03/2020   CO2 27 03/03/2020   TSH 1.339 05/23/2015   INR 1.02 07/20/2017   HGBA1C 7.1 (H) 02/04/2020   MICROALBUR 150 08/10/2018    CT KNEE RIGHT WO CONTRAST  Result Date: 12/03/2019 CLINICAL DATA:  Right knee pain after fall 2 weeks ago. Reported patellar fracture. EXAM: CT OF THE RIGHT KNEE WITHOUT CONTRAST TECHNIQUE: Multidetector CT imaging of the right knee was performed according to the standard protocol. Multiplanar CT image reconstructions were also generated. COMPARISON:  None available FINDINGS: Bones/Joint/Cartilage There is faint patchy sclerosis in the linear orientation at the mid pole of patella without a clearly defined fracture line (series 11, images 39-43). Findings could potentially represent a healing nondisplaced fracture given the  provided history of patellar fracture. Elsewhere, there is no evidence of an acute fracture. No dislocation. Severe tricompartmental osteoarthritis as manifested by joint space narrowing, subchondral sclerosis, and bulky marginal osteophytosis. Findings are most severe within the tibiofemoral compartments. There is a large hyperdense knee joint effusion with a single lobule of fat floating within the effusion. No layering fat-fluid or hematocrit level. There is a moderate-sized Baker's cyst measuring up to 5.0 cm containing multiple mineralized loose bodies, the largest  of which measures up to 1.8 cm. Ligaments Suboptimally assessed by CT. Muscles and Tendons No acute musculotendinous abnormality. Soft tissues No soft tissue fluid collection or hematoma. Scattered vascular calcifications. IMPRESSION: 1. Faint patchy sclerosis in the linear orientation at the mid pole of the patella without a clearly defined fracture line. Findings could potentially represent a healing nondisplaced fracture given the provided history of patellar fracture. 2. Otherwise, no evidence of acute fracture or malalignment involving the right knee. 3. Large hyperdense knee joint effusion suggestive of a hemarthrosis with a single lobule of fat floating within the effusion. 4. Severe tricompartmental osteoarthritis. 5. Moderate-sized Baker's cyst containing multiple mineralized loose bodies. Electronically Signed   By: Davina Poke D.O.   On: 12/03/2019 16:49   MR FOOT RIGHT WO CONTRAST  Result Date: 03/07/2020 CLINICAL DATA:  Diffuse right foot pain and swelling since fall 3 months ago. No prior surgery. EXAM: MRI OF THE RIGHT FOREFOOT WITHOUT CONTRAST TECHNIQUE: Multiplanar, multisequence MR imaging of the right forefoot was performed. No intravenous contrast was administered. COMPARISON:  Right foot x-rays from February 25, 2020, and February 04, 2020. FINDINGS: Bones/Joint/Cartilage Marrow edema involving the bases and proximal  shafts of the second through fifth metatarsals as well as the middle and lateral cuneiforms, and cuboid. No definite fracture line identified. No dislocation. Lisfranc alignment is maintained. No joint effusion. Mild pes planus. Ligaments The dorsal component of the Lisfranc ligament is mildly thickened but appears intact. The interosseous and plantar components of the Lisfranc ligament are intact. Thickening of the dorsal cuneonavicular ligament. Toe collateral ligaments are intact. Muscles and Tendons Flexor and extensor tendons are intact. Reactive muscle edema surrounding the proximal second through fourth metatarsals. No muscle atrophy. Soft tissue Mild dorsal soft tissue swelling. No fluid collection or hematoma. No soft tissue mass. IMPRESSION: 1. Extensive midfoot contusions around the Lisfranc joint as described above. No definite fracture identified, however CT would be better at evaluating for small avulsion fractures. 2. Sprain of the dorsal component of the Lisfranc ligament. Lisfranc alignment is maintained. Electronically Signed   By: Titus Dubin M.D.   On: 03/07/2020 09:04   DG Foot Complete Right  Result Date: 03/06/2020 Please see detailed radiograph report in office note.   MR FOOT RIGHT WO CONTRAST  Result Date: 03/07/2020 CLINICAL DATA:  Diffuse right foot pain and swelling since fall 3 months ago. No prior surgery. EXAM: MRI OF THE RIGHT FOREFOOT WITHOUT CONTRAST TECHNIQUE: Multiplanar, multisequence MR imaging of the right forefoot was performed. No intravenous contrast was administered. COMPARISON:  Right foot x-rays from February 25, 2020, and February 04, 2020. FINDINGS: Bones/Joint/Cartilage Marrow edema involving the bases and proximal shafts of the second through fifth metatarsals as well as the middle and lateral cuneiforms, and cuboid. No definite fracture line identified. No dislocation. Lisfranc alignment is maintained. No joint effusion. Mild pes planus. Ligaments The  dorsal component of the Lisfranc ligament is mildly thickened but appears intact. The interosseous and plantar components of the Lisfranc ligament are intact. Thickening of the dorsal cuneonavicular ligament. Toe collateral ligaments are intact. Muscles and Tendons Flexor and extensor tendons are intact. Reactive muscle edema surrounding the proximal second through fourth metatarsals. No muscle atrophy. Soft tissue Mild dorsal soft tissue swelling. No fluid collection or hematoma. No soft tissue mass. IMPRESSION: 1. Extensive midfoot contusions around the Lisfranc joint as described above. No definite fracture identified, however CT would be better at evaluating for small avulsion fractures. 2. Sprain of the dorsal component  of the Lisfranc ligament. Lisfranc alignment is maintained. Electronically Signed   By: Titus Dubin M.D.   On: 03/07/2020 09:04   DG Foot Complete Left  Result Date: 03/05/2020 CLINICAL DATA:  Nontraumatic pain and swelling, patient states history of gout EXAM: LEFT FOOT - COMPLETE 3+ VIEW COMPARISON:  None. FINDINGS: Alignment is anatomic. Joint spaces are preserved. Mild dorsal midfoot spurring. There is a 12 mm focus of soft tissue calcification medially adjacent to the midfoot. Mild soft tissue swelling along the medial aspect of the first metatarsophalangeal joint. IMPRESSION: 1. No acute osseous abnormality. 2. Mild soft tissue swelling along medial aspect of the first MTP joint. 3. Nonspecific soft tissue calcification medially adjacent to the midfoot. Electronically Signed   By: Macy Mis M.D.   On: 03/05/2020 14:50   DG Foot Complete Right  Result Date: 03/06/2020 Please see detailed radiograph report in office note.   Assessment & Plan:   [name] was seen today for foot swelling.  Diagnoses and all orders for this visit:  Gouty arthritis of left foot -     DG Foot Complete Left; Future -     Colchicine (MITIGARE) 0.6 MG CAPS; Take 1 tablet by mouth 2 (two)  times daily. -     oxyCODONE (OXY IR/ROXICODONE) 5 MG immediate release tablet; Take 1 tablet (5 mg total) by mouth every 6 (six) hours as needed for up to 7 days for severe pain. -     ketorolac (TORADOL) injection 60 mg -     Discontinue: methylPREDNISolone acetate (DEPO-MEDROL) injection 80 mg -     methylPREDNISolone acetate (DEPO-MEDROL) injection 120 mg  Pain and swelling of toe of left foot- Based on her symptoms, exam, and x-ray I think she is having acute gout attack.  She has a history of renal insufficiency so will of avoid long-term NSAIDs.  Will treat with an injection of toradol/methylprednisolone, colchicine, and oxycodone as needed for pain. -     DG Foot Complete Left; Future -     ketorolac (TORADOL) injection 60 mg -     Discontinue: methylPREDNISolone acetate (DEPO-MEDROL) injection 80 mg -     methylPREDNISolone acetate (DEPO-MEDROL) injection 120 mg   I am having [name] start on Colchicine and oxyCODONE. I am also having her maintain her loperamide, aspirin, Accu-Chek Aviva Plus, accu-chek soft touch, albuterol, baclofen, hydrochlorothiazide, diltiazem, diltiazem, traMADol, losartan, and famotidine. We administered ketorolac and methylPREDNISolone acetate.  Meds ordered this encounter  Medications   Colchicine (MITIGARE) 0.6 MG CAPS    Sig: Take 1 tablet by mouth 2 (two) times daily.    Dispense:  60 capsule    Refill:  0   oxyCODONE (OXY IR/ROXICODONE) 5 MG immediate release tablet    Sig: Take 1 tablet (5 mg total) by mouth every 6 (six) hours as needed for up to 7 days for severe pain.    Dispense:  30 tablet    Refill:  0   ketorolac (TORADOL) injection 60 mg   DISCONTD: methylPREDNISolone acetate (DEPO-MEDROL) injection 80 mg   methylPREDNISolone acetate (DEPO-MEDROL) injection 120 mg     Follow-up: Return in about 3 weeks (around 03/26/2020).  Scarlette Calico, MD

## 2020-03-05 NOTE — Telephone Encounter (Signed)
Patient calling stating starting last night she starting having severe pain in her left foot, she is unable to move her toes and its burning on the top of her foot. She believes its gout because she has had it in her right foot before. Patient denied an appointment at this time because she said she can barely even walk. Wanted to call and see if there was anything we could do.  [phone]

## 2020-03-05 NOTE — Patient Instructions (Signed)
Gout  Gout is a condition that causes painful swelling of the joints. Gout is a type of inflammation of the joints (arthritis). This condition is caused by having too much uric acid in the body. Uric acid is a chemical that forms when the body breaks down substances called purines. Purines are important for building body proteins. When the body has too much uric acid, sharp crystals can form and build up inside the joints. This causes pain and swelling. Gout attacks can happen quickly and may be very painful (acute gout). Over time, the attacks can affect more joints and become more frequent (chronic gout). Gout can also cause uric acid to build up under the skin and inside the kidneys. What are the causes? This condition is caused by too much uric acid in your blood. This can happen because:  Your kidneys do not remove enough uric acid from your blood. This is the most common cause.  Your body makes too much uric acid. This can happen with some cancers and cancer treatments. It can also occur if your body is breaking down too many red blood cells (hemolytic anemia).  You eat too many foods that are high in purines. These foods include organ meats and some seafood. Alcohol, especially beer, is also high in purines. A gout attack may be triggered by trauma or stress. What increases the risk? You are more likely to develop this condition if you:  Have a family history of gout.  Are female and middle-aged.  Are female and have gone through menopause.  Are obese.  Frequently drink alcohol, especially beer.  Are dehydrated.  Lose weight too quickly.  Have an organ transplant.  Have lead poisoning.  Take certain medicines, including aspirin, cyclosporine, diuretics, levodopa, and niacin.  Have kidney disease.  Have a skin condition called psoriasis. What are the signs or symptoms? An attack of acute gout happens quickly. It usually occurs in just one joint. The most common place is  the big toe. Attacks often start at night. Other joints that may be affected include joints of the feet, ankle, knee, fingers, wrist, or elbow. Symptoms of this condition may include:  Severe pain.  Warmth.  Swelling.  Stiffness.  Tenderness. The affected joint may be very painful to touch.  Shiny, red, or purple skin.  Chills and fever. Chronic gout may cause symptoms more frequently. More joints may be involved. You may also have white or yellow lumps (tophi) on your hands or feet or in other areas near your joints.   How is this diagnosed? This condition is diagnosed based on your symptoms, medical history, and physical exam. You may have tests, such as:  Blood tests to measure uric acid levels.  Removal of joint fluid with a thin needle (aspiration) to look for uric acid crystals.  X-rays to look for joint damage. How is this treated? Treatment for this condition has two phases: treating an acute attack and preventing future attacks. Acute gout treatment may include medicines to reduce pain and swelling, including:  NSAIDs.  Steroids. These are strong anti-inflammatory medicines that can be taken by mouth (orally) or injected into a joint.  Colchicine. This medicine relieves pain and swelling when it is taken soon after an attack. It can be given by mouth or through an IV. Preventive treatment may include:  Daily use of smaller doses of NSAIDs or colchicine.  Use of a medicine that reduces uric acid levels in your blood.  Changes to your diet.   You may need to see a dietitian about what to eat and drink to prevent gout. Follow these instructions at home: During a gout attack  If directed, put ice on the affected area: ? Put ice in a plastic bag. ? Place a towel between your skin and the bag. ? Leave the ice on for 20 minutes, 2-3 times a day.  Raise (elevate) the affected joint above the level of your heart as often as possible.  Rest the joint as much as possible.  If the affected joint is in your leg, you may be given crutches to use.  Follow instructions from your health care provider about eating or drinking restrictions.   Avoiding future gout attacks  Follow a low-purine diet as told by your dietitian or health care provider. Avoid foods and drinks that are high in purines, including liver, kidney, anchovies, asparagus, herring, mushrooms, mussels, and beer.  Maintain a healthy weight or lose weight if you are overweight. If you want to lose weight, talk with your health care provider. It is important that you do not lose weight too quickly.  Start or maintain an exercise program as told by your health care provider. Eating and drinking  Drink enough fluids to keep your urine pale yellow.  If you drink alcohol: ? Limit how much you use to:  0-1 drink a day for women.  0-2 drinks a day for men. ? Be aware of how much alcohol is in your drink. In the [place] one drink equals one 12 oz bottle of beer (355 mL) one 5 oz glass of wine (148 mL), or one 1 oz glass of hard liquor (44 mL). General instructions  Take over-the-counter and prescription medicines only as told by your health care provider.  Do not drive or use heavy machinery while taking prescription pain medicine.  Return to your normal activities as told by your health care provider. Ask your health care provider what activities are safe for you.  Keep all follow-up visits as told by your health care provider. This is important. Contact a health care provider if you have:  Another gout attack.  Continuing symptoms of a gout attack after 10 days of treatment.  Side effects from your medicines.  Chills or a fever.  Burning pain when you urinate.  Pain in your lower back or belly. Get help right away if you:  Have severe or uncontrolled pain.  Cannot urinate. Summary  Gout is painful swelling of the joints caused by inflammation.  The most common site of pain is the big  toe, but it can affect other joints in the body.  Medicines and dietary changes can help to prevent and treat gout attacks. This information is not intended to replace advice given to you by your health care provider. Make sure you discuss any questions you have with your health care provider. Document Revised: 08/31/2017 Document Reviewed: 08/31/2017 Elsevier Patient Education  2021 [employer]  

## 2020-03-06 ENCOUNTER — Ambulatory Visit
Admission: RE | Admit: 2020-03-06 | Discharge: 2020-03-06 | Disposition: A | Discharge status: Home / Self Care | Payer: Medicare HMO | Source: Ambulatory Visit | Attending: Podiatry | Admitting: Podiatry

## 2020-03-06 DIAGNOSIS — S9031XA Contusion of right foot, initial encounter: Secondary | ICD-10-CM | POA: Diagnosis not present

## 2020-03-06 DIAGNOSIS — M7989 Other specified soft tissue disorders: Secondary | ICD-10-CM | POA: Diagnosis not present

## 2020-03-06 DIAGNOSIS — S93324A Dislocation of tarsometatarsal joint of right foot, initial encounter: Secondary | ICD-10-CM

## 2020-03-06 DIAGNOSIS — M778 Other enthesopathies, not elsewhere classified: Secondary | ICD-10-CM

## 2020-03-06 DIAGNOSIS — R600 Localized edema: Secondary | ICD-10-CM | POA: Diagnosis not present

## 2020-03-06 DIAGNOSIS — S93601A Unspecified sprain of right foot, initial encounter: Secondary | ICD-10-CM | POA: Diagnosis not present

## 2020-03-06 DIAGNOSIS — S99921A Unspecified injury of right foot, initial encounter: Secondary | ICD-10-CM

## 2020-03-06 NOTE — Telephone Encounter (Signed)
Visit with [name] yesterday will done message.

## 2020-03-07 ENCOUNTER — Encounter: Payer: Self-pay | Admitting: Internal Medicine

## 2020-03-07 ENCOUNTER — Other Ambulatory Visit: Payer: Self-pay | Admitting: Internal Medicine

## 2020-03-17 ENCOUNTER — Ambulatory Visit: Payer: Medicare HMO | Admitting: Podiatry

## 2020-03-17 ENCOUNTER — Other Ambulatory Visit: Payer: Self-pay

## 2020-03-17 DIAGNOSIS — S99921A Unspecified injury of right foot, initial encounter: Secondary | ICD-10-CM | POA: Diagnosis not present

## 2020-03-17 DIAGNOSIS — S93324A Dislocation of tarsometatarsal joint of right foot, initial encounter: Secondary | ICD-10-CM

## 2020-03-17 DIAGNOSIS — M778 Other enthesopathies, not elsewhere classified: Secondary | ICD-10-CM

## 2020-03-24 ENCOUNTER — Ambulatory Visit
Admission: RE | Admit: 2020-03-24 | Discharge: 2020-03-24 | Disposition: A | Discharge status: Home / Self Care | Payer: Medicare HMO | Source: Ambulatory Visit | Attending: Internal Medicine | Admitting: Internal Medicine

## 2020-03-24 ENCOUNTER — Other Ambulatory Visit: Payer: Self-pay

## 2020-03-24 DIAGNOSIS — R1084 Generalized abdominal pain: Secondary | ICD-10-CM

## 2020-03-24 DIAGNOSIS — K4090 Unilateral inguinal hernia, without obstruction or gangrene, not specified as recurrent: Secondary | ICD-10-CM | POA: Diagnosis not present

## 2020-03-24 DIAGNOSIS — K4020 Bilateral inguinal hernia, without obstruction or gangrene, not specified as recurrent: Secondary | ICD-10-CM | POA: Diagnosis not present

## 2020-03-24 DIAGNOSIS — N281 Cyst of kidney, acquired: Secondary | ICD-10-CM | POA: Diagnosis not present

## 2020-03-24 DIAGNOSIS — N83201 Unspecified ovarian cyst, right side: Secondary | ICD-10-CM | POA: Diagnosis not present

## 2020-03-24 MED ORDER — IOPAMIDOL (ISOVUE-300) INJECTION 61%
100.0000 mL | Freq: Once | INTRAVENOUS | Status: AC | PRN
  Administered 2020-03-24: 100 mL via INTRAVENOUS

## 2020-03-27 ENCOUNTER — Other Ambulatory Visit: Payer: Self-pay | Admitting: Internal Medicine

## 2020-03-31 NOTE — Progress Notes (Signed)
HPI: 80 y.o. female presenting today as a new patient for evaluation of right foot pain is been going on for approximately 3 months now.  Patient states that towards the end of September/beginning of October she sustained a trip and fall injury at her home.  She fractured her knee in 3 places and sustained injuries to her right foot and hip as well.  Patient states that since that injury her right foot has been severely painful and swollen despite x-rays that were negative for any fracture or dislocation.    Last visit on 02/25/2020 MRI was ordered.  She presents today to review MRI results and discuss further treatment options.  She states that actually over the last few weeks she has been feeling much better.  She presents for follow-up treatment and evaluation  Past Medical History:  Diagnosis Date  . Allergic rhinitis   . Allergy   . Anxiety    and panic attacks;pt states that she is claustrophobic  . Asthma   . Atrial fibrillation ([place])    Used to take Coumadin-instructed to stop  . Bronchitis    in Jan 2013-uses Albuterol prn  . Bronchitis, acute 03-01-11   FINSHED Z-PAK-STILL ON PREDNISONE NOW  . Cataract    bilateral surgery to remove  . Chronic back pain    hx buldging disc  . Colon cancer ([place]) 2007   s/p surgery and chemo  . Constipation    takes Colace prn  . Diabetes mellitus    glimepiride  . Dizziness    occasionally  . DJD (degenerative joint disease)   . Family history of anesthesia complication    daughter gets PONV  . Family hx of colon cancer   . Fibromyalgia    but doesn't take any meds  . Gastric ulcer   . GERD (gastroesophageal reflux disease)    uses Tums prn  . Gout    takes Colchicine prn  . Hemorrhoids   . History of blood clots    right lung in early 90's  . History of colon polyps   . History of migraine headaches    last migraine 100yrs ago;Pt states she does get sinus headaches  . History of PSVT (paroxysmal supraventricular tachycardia) no  current problems  . History of shingles   . HLD (hyperlipidemia)    diet control - no meds  . Hypertension    takes Micardis and Diltiazem daily  . Nocturia   . Numbness    left hand  . Osteoarthritis   . Peptic ulcer disease   . Peripheral edema    takes Furosemide every other day  . Peripheral edema    takes Lasix daily  . Rash    on neck and started after she started taking her beta blocker 64yrs ago;medical MD has seen this  . Shortness of breath    pt states that she can be sitting as well as exertion and get short of breath  . Sleep apnea    doesn't use CPAP  . Spinal headache   . Urinary frequency   . Urinary frequency   . Yeast infection of the vagina    being treated for this now by dr.fontaine     Physical Exam: General: The patient is alert and oriented x3 in no acute distress.  Dermatology: Skin is warm, dry and supple bilateral lower extremities. Negative for open lesions or macerations.  Vascular: Palpable pedal pulses bilaterally.  Moderate edema noted to the right midfoot. Capillary  refill within normal limits.  Neurological: Epicritic and protective threshold grossly intact bilaterally.   Musculoskeletal Exam: Range of motion within normal limits to all pedal and ankle joints bilateral. Muscle strength 5/5 in all groups bilateral.  Significant improvement of the tenderness and pain with palpation to the right midfoot  MRI impression 03/06/2020 right foot: 1. Extensive midfoot contusions around the Lisfranc joint as described above. No definite fracture identified, however CT would be better at evaluating for small avulsion fractures. 2. Sprain of the dorsal component of the Lisfranc ligament. Lisfranc alignment is maintained.  Assessment: 1.  Right foot sprain; improving   Plan of Care:  1. Patient evaluated.  MRI reviewed.  2.  Overall the patient has noticed improvement for the past past few weeks 3.  Continue wearing good supportive shoes and  slowly increase activity to full activity no restrictions over the next 3-4 weeks 4.  Return to clinic as needed     Edrick Kins, DPM [facility]  Dr. Edrick Kins, DPM    [address], [place] [address]                Office [phone]  Fax [phone]

## 2020-04-04 ENCOUNTER — Other Ambulatory Visit: Payer: Self-pay | Admitting: Internal Medicine

## 2020-04-10 ENCOUNTER — Other Ambulatory Visit: Payer: Self-pay | Admitting: Internal Medicine

## 2020-04-18 DIAGNOSIS — M1711 Unilateral primary osteoarthritis, right knee: Secondary | ICD-10-CM | POA: Diagnosis not present

## 2020-04-19 ENCOUNTER — Inpatient Hospital Stay (HOSPITAL_COMMUNITY)
Admission: EM | Admit: 2020-04-19 | Discharge: 2020-04-21 | DRG: 281 | Disposition: A | Discharge status: Home / Self Care | Payer: Medicare HMO | Attending: Internal Medicine | Admitting: Internal Medicine

## 2020-04-19 ENCOUNTER — Emergency Department (HOSPITAL_COMMUNITY): Payer: Medicare HMO

## 2020-04-19 ENCOUNTER — Encounter (HOSPITAL_COMMUNITY): Payer: Self-pay | Admitting: Internal Medicine

## 2020-04-19 ENCOUNTER — Observation Stay (HOSPITAL_COMMUNITY): Payer: Medicare HMO

## 2020-04-19 ENCOUNTER — Other Ambulatory Visit: Payer: Self-pay

## 2020-04-19 DIAGNOSIS — M549 Dorsalgia, unspecified: Secondary | ICD-10-CM | POA: Diagnosis present

## 2020-04-19 DIAGNOSIS — I10 Essential (primary) hypertension: Secondary | ICD-10-CM | POA: Diagnosis present

## 2020-04-19 DIAGNOSIS — G4733 Obstructive sleep apnea (adult) (pediatric): Secondary | ICD-10-CM | POA: Diagnosis present

## 2020-04-19 DIAGNOSIS — Z91048 Other nonmedicinal substance allergy status: Secondary | ICD-10-CM

## 2020-04-19 DIAGNOSIS — E1169 Type 2 diabetes mellitus with other specified complication: Secondary | ICD-10-CM | POA: Diagnosis present

## 2020-04-19 DIAGNOSIS — Z9049 Acquired absence of other specified parts of digestive tract: Secondary | ICD-10-CM | POA: Diagnosis not present

## 2020-04-19 DIAGNOSIS — Z85038 Personal history of other malignant neoplasm of large intestine: Secondary | ICD-10-CM

## 2020-04-19 DIAGNOSIS — Z20822 Contact with and (suspected) exposure to covid-19: Secondary | ICD-10-CM | POA: Diagnosis present

## 2020-04-19 DIAGNOSIS — G8929 Other chronic pain: Secondary | ICD-10-CM | POA: Diagnosis present

## 2020-04-19 DIAGNOSIS — Z9851 Tubal ligation status: Secondary | ICD-10-CM | POA: Diagnosis not present

## 2020-04-19 DIAGNOSIS — M5441 Lumbago with sciatica, right side: Secondary | ICD-10-CM | POA: Diagnosis not present

## 2020-04-19 DIAGNOSIS — I4891 Unspecified atrial fibrillation: Secondary | ICD-10-CM

## 2020-04-19 DIAGNOSIS — I2489 Other forms of acute ischemic heart disease: Secondary | ICD-10-CM | POA: Insufficient documentation

## 2020-04-19 DIAGNOSIS — R519 Headache, unspecified: Secondary | ICD-10-CM | POA: Diagnosis present

## 2020-04-19 DIAGNOSIS — K219 Gastro-esophageal reflux disease without esophagitis: Secondary | ICD-10-CM | POA: Diagnosis present

## 2020-04-19 DIAGNOSIS — Z885 Allergy status to narcotic agent status: Secondary | ICD-10-CM

## 2020-04-19 DIAGNOSIS — Z7984 Long term (current) use of oral hypoglycemic drugs: Secondary | ICD-10-CM

## 2020-04-19 DIAGNOSIS — I214 Non-ST elevation (NSTEMI) myocardial infarction: Secondary | ICD-10-CM | POA: Diagnosis present

## 2020-04-19 DIAGNOSIS — I480 Paroxysmal atrial fibrillation: Principal | ICD-10-CM

## 2020-04-19 DIAGNOSIS — Z87891 Personal history of nicotine dependence: Secondary | ICD-10-CM

## 2020-04-19 DIAGNOSIS — I129 Hypertensive chronic kidney disease with stage 1 through stage 4 chronic kidney disease, or unspecified chronic kidney disease: Secondary | ICD-10-CM | POA: Diagnosis present

## 2020-04-19 DIAGNOSIS — Z96652 Presence of left artificial knee joint: Secondary | ICD-10-CM | POA: Diagnosis present

## 2020-04-19 DIAGNOSIS — Z79899 Other long term (current) drug therapy: Secondary | ICD-10-CM

## 2020-04-19 DIAGNOSIS — E785 Hyperlipidemia, unspecified: Secondary | ICD-10-CM | POA: Diagnosis present

## 2020-04-19 DIAGNOSIS — E6609 Other obesity due to excess calories: Secondary | ICD-10-CM | POA: Diagnosis present

## 2020-04-19 DIAGNOSIS — Z7982 Long term (current) use of aspirin: Secondary | ICD-10-CM

## 2020-04-19 DIAGNOSIS — Z9221 Personal history of antineoplastic chemotherapy: Secondary | ICD-10-CM | POA: Diagnosis not present

## 2020-04-19 DIAGNOSIS — J449 Chronic obstructive pulmonary disease, unspecified: Secondary | ICD-10-CM | POA: Diagnosis present

## 2020-04-19 DIAGNOSIS — J309 Allergic rhinitis, unspecified: Secondary | ICD-10-CM | POA: Diagnosis present

## 2020-04-19 DIAGNOSIS — I248 Other forms of acute ischemic heart disease: Secondary | ICD-10-CM | POA: Diagnosis present

## 2020-04-19 DIAGNOSIS — Z981 Arthrodesis status: Secondary | ICD-10-CM | POA: Diagnosis not present

## 2020-04-19 DIAGNOSIS — Z6834 Body mass index (BMI) 34.0-34.9, adult: Secondary | ICD-10-CM | POA: Diagnosis not present

## 2020-04-19 DIAGNOSIS — E1136 Type 2 diabetes mellitus with diabetic cataract: Secondary | ICD-10-CM | POA: Diagnosis present

## 2020-04-19 DIAGNOSIS — Z800 Family history of malignant neoplasm of digestive organs: Secondary | ICD-10-CM | POA: Diagnosis not present

## 2020-04-19 DIAGNOSIS — E1122 Type 2 diabetes mellitus with diabetic chronic kidney disease: Secondary | ICD-10-CM | POA: Diagnosis present

## 2020-04-19 DIAGNOSIS — Z8711 Personal history of peptic ulcer disease: Secondary | ICD-10-CM

## 2020-04-19 DIAGNOSIS — R079 Chest pain, unspecified: Secondary | ICD-10-CM | POA: Diagnosis not present

## 2020-04-19 DIAGNOSIS — N1832 Chronic kidney disease, stage 3b: Secondary | ICD-10-CM | POA: Diagnosis present

## 2020-04-19 DIAGNOSIS — Z8249 Family history of ischemic heart disease and other diseases of the circulatory system: Secondary | ICD-10-CM

## 2020-04-19 DIAGNOSIS — Z888 Allergy status to other drugs, medicaments and biological substances status: Secondary | ICD-10-CM

## 2020-04-19 LAB — ECHOCARDIOGRAM COMPLETE
Calc EF: 59.1 %
Height: 63 in
S' Lateral: 3.1 cm
Single Plane A2C EF: 60.9 %
Single Plane A4C EF: 54.2 %
Weight: 3176 oz

## 2020-04-19 LAB — BASIC METABOLIC PANEL
Anion gap: 12 (ref 5–15)
BUN: 34 mg/dL — ABNORMAL HIGH (ref 8–23)
CO2: 21 mmol/L — ABNORMAL LOW (ref 22–32)
Calcium: 10 mg/dL (ref 8.9–10.3)
Chloride: 104 mmol/L (ref 98–111)
Creatinine, Ser: 1.3 mg/dL — ABNORMAL HIGH (ref 0.44–1.00)
GFR, Estimated: 42 mL/min — ABNORMAL LOW (ref >60)
Glucose, Bld: 237 mg/dL — ABNORMAL HIGH (ref 70–99)
Potassium: 3.8 mmol/L (ref 3.5–5.1)
Sodium: 137 mmol/L (ref 135–145)

## 2020-04-19 LAB — GLUCOSE, CAPILLARY
Glucose-Capillary: 190 mg/dL — ABNORMAL HIGH (ref 70–99)
Glucose-Capillary: 142 mg/dL — ABNORMAL HIGH (ref 70–99)

## 2020-04-19 LAB — TROPONIN I (HIGH SENSITIVITY)
Troponin I (High Sensitivity): 11 ng/L (ref <18)
Troponin I (High Sensitivity): 34 ng/L — ABNORMAL HIGH (ref <18)
Troponin I (High Sensitivity): 165 ng/L (ref <18)
Troponin I (High Sensitivity): 487 ng/L (ref <18)
Troponin I (High Sensitivity): 972 ng/L (ref <18)
Troponin I (High Sensitivity): 1043 ng/L (ref <18)
Troponin I (High Sensitivity): 1102 ng/L (ref <18)

## 2020-04-19 LAB — CBC
HCT: 44.3 % (ref 36.0–46.0)
Hemoglobin: 14.5 g/dL (ref 12.0–15.0)
MCH: 29.2 pg (ref 26.0–34.0)
MCHC: 32.7 g/dL (ref 30.0–36.0)
MCV: 89.1 fL (ref 80.0–100.0)
Platelets: 253 10*3/uL (ref 150–400)
RBC: 4.97 MIL/uL (ref 3.87–5.11)
RDW: 14.1 % (ref 11.5–15.5)
WBC: 10.4 10*3/uL (ref 4.0–10.5)
nRBC: 0 % (ref 0.0–0.2)

## 2020-04-19 LAB — SARS CORONAVIRUS 2 (TAT 6-24 HRS): SARS Coronavirus 2: NEGATIVE

## 2020-04-19 LAB — TSH: TSH: 0.774 u[IU]/mL (ref 0.350–4.500)

## 2020-04-19 LAB — HEPARIN LEVEL (UNFRACTIONATED): Heparin Unfractionated: 0.66 IU/mL (ref 0.30–0.70)

## 2020-04-19 MED ORDER — ASPIRIN EC 81 MG PO TBEC
81.0000 mg | DELAYED_RELEASE_TABLET | Freq: Every day | ORAL | Status: DC
  Administered 2020-04-19 – 2020-04-20 (×2): 81 mg via ORAL
  Filled 2020-04-19 (×2): qty 1

## 2020-04-19 MED ORDER — NITROGLYCERIN 0.4 MG SL SUBL: 0.4000 mg | SUBLINGUAL_TABLET | SUBLINGUAL | Status: DC | PRN

## 2020-04-19 MED ORDER — DOCUSATE SODIUM 100 MG PO CAPS
100.0000 mg | ORAL_CAPSULE | Freq: Two times a day (BID) | ORAL | Status: DC
  Administered 2020-04-19 – 2020-04-20 (×2): 100 mg via ORAL
  Filled 2020-04-19 (×3): qty 1

## 2020-04-19 MED ORDER — DILTIAZEM LOAD VIA INFUSION
20.0000 mg | Freq: Once | INTRAVENOUS | Status: AC
  Administered 2020-04-19: 20 mg via INTRAVENOUS
  Filled 2020-04-19: qty 20

## 2020-04-19 MED ORDER — ACETAMINOPHEN 325 MG PO TABS
650.0000 mg | ORAL_TABLET | ORAL | Status: DC | PRN
  Administered 2020-04-19: 650 mg via ORAL
  Filled 2020-04-19: qty 2

## 2020-04-19 MED ORDER — ATORVASTATIN CALCIUM 10 MG PO TABS
20.0000 mg | ORAL_TABLET | Freq: Every day | ORAL | Status: DC
  Administered 2020-04-19 – 2020-04-21 (×3): 20 mg via ORAL
  Filled 2020-04-19 (×3): qty 2

## 2020-04-19 MED ORDER — DILTIAZEM HCL-DEXTROSE 125-5 MG/125ML-% IV SOLN (PREMIX): 5.0000 mg/h | INTRAVENOUS | Status: DC

## 2020-04-19 MED ORDER — HEPARIN (PORCINE) 25000 UT/250ML-% IV SOLN
1050.0000 [IU]/h | INTRAVENOUS | Status: DC
  Administered 2020-04-19 – 2020-04-20 (×2): 1050 [IU]/h via INTRAVENOUS
  Filled 2020-04-19 (×3): qty 250

## 2020-04-19 MED ORDER — FENTANYL CITRATE (PF) 100 MCG/2ML IJ SOLN
100.0000 ug | Freq: Once | INTRAMUSCULAR | Status: AC
  Administered 2020-04-19: 100 ug via INTRAVENOUS
  Filled 2020-04-19: qty 2

## 2020-04-19 MED ORDER — INSULIN ASPART 100 UNIT/ML ~~LOC~~ SOLN: 0.0000 [IU] | Freq: Every day | SUBCUTANEOUS | Status: DC

## 2020-04-19 MED ORDER — FAMOTIDINE 20 MG PO TABS
20.0000 mg | ORAL_TABLET | Freq: Two times a day (BID) | ORAL | Status: DC
  Administered 2020-04-19 – 2020-04-21 (×5): 20 mg via ORAL
  Filled 2020-04-19 (×5): qty 1

## 2020-04-19 MED ORDER — DILTIAZEM HCL 25 MG/5ML IV SOLN
15.0000 mg | Freq: Once | INTRAVENOUS | Status: AC
  Administered 2020-04-19: 15 mg via INTRAVENOUS
  Filled 2020-04-19: qty 5

## 2020-04-19 MED ORDER — BACLOFEN 10 MG PO TABS
10.0000 mg | ORAL_TABLET | Freq: Two times a day (BID) | ORAL | Status: DC | PRN
  Filled 2020-04-19: qty 1

## 2020-04-19 MED ORDER — ONDANSETRON HCL 4 MG/2ML IJ SOLN: 4.0000 mg | Freq: Four times a day (QID) | INTRAMUSCULAR | Status: DC | PRN

## 2020-04-19 MED ORDER — SODIUM CHLORIDE 0.9% FLUSH
3.0000 mL | Freq: Two times a day (BID) | INTRAVENOUS | Status: DC
  Administered 2020-04-20 (×2): 3 mL via INTRAVENOUS

## 2020-04-19 MED ORDER — ENOXAPARIN SODIUM 40 MG/0.4ML ~~LOC~~ SOLN
40.0000 mg | SUBCUTANEOUS | Status: DC
  Administered 2020-04-19: 40 mg via SUBCUTANEOUS
  Filled 2020-04-19: qty 0.4

## 2020-04-19 MED ORDER — COLCHICINE 0.6 MG PO TABS
0.6000 mg | ORAL_TABLET | Freq: Two times a day (BID) | ORAL | Status: DC
  Administered 2020-04-19 – 2020-04-21 (×5): 0.6 mg via ORAL
  Filled 2020-04-19 (×5): qty 1

## 2020-04-19 MED ORDER — ALBUTEROL SULFATE HFA 108 (90 BASE) MCG/ACT IN AERS
2.0000 | INHALATION_SPRAY | Freq: Four times a day (QID) | RESPIRATORY_TRACT | Status: DC | PRN
  Filled 2020-04-19: qty 6.7

## 2020-04-19 MED ORDER — LACTATED RINGERS IV BOLUS
1000.0000 mL | Freq: Once | INTRAVENOUS | Status: AC
  Administered 2020-04-19: 1000 mL via INTRAVENOUS

## 2020-04-19 MED ORDER — HEPARIN BOLUS VIA INFUSION
2500.0000 [IU] | Freq: Once | INTRAVENOUS | Status: AC
  Administered 2020-04-19: 2500 [IU] via INTRAVENOUS
  Filled 2020-04-19: qty 2500

## 2020-04-19 MED ORDER — DILTIAZEM HCL-DEXTROSE 125-5 MG/125ML-% IV SOLN (PREMIX)
5.0000 mg/h | INTRAVENOUS | Status: DC
  Administered 2020-04-19: 5 mg/h via INTRAVENOUS
  Filled 2020-04-19: qty 125

## 2020-04-19 MED ORDER — TRAMADOL HCL 50 MG PO TABS: 50.0000 mg | ORAL_TABLET | Freq: Four times a day (QID) | ORAL | Status: DC | PRN

## 2020-04-19 MED ORDER — INSULIN ASPART 100 UNIT/ML ~~LOC~~ SOLN
0.0000 [IU] | Freq: Three times a day (TID) | SUBCUTANEOUS | Status: DC
  Administered 2020-04-19 – 2020-04-20 (×2): 3 [IU] via SUBCUTANEOUS

## 2020-04-19 NOTE — ED Notes (Signed)
Dr Lorin Mercy notified of critical lab Trop 165   She will consult cardiology

## 2020-04-19 NOTE — Progress Notes (Signed)
[place] for [name] Indication: atrial fibrillation  Allergies  Allergen Reactions  . Benazepril Other (See Comments)    coughing  . Lopressor [Metoprolol] Other (See Comments)    nervousness  . Adhesive [Tape] Other (See Comments)    redness  . Morphine Other (See Comments)       Makes you feel bad all over  . Nifedipine Hives and Itching  . Piroxicam Hives and Itching    Patient Measurements: Height: 5\' 3"  (160 cm) Weight: 87.1 kg (192 lb) IBW/kg (Calculated) : 52.4 Heparin Dosing Weight: 72 kg  Vital Signs: Temp: 98.6 F (37 C) (02/26 0504) Temp Source: Oral (02/26 0504) BP: 127/79 (02/26 1245) Pulse Rate: 72 (02/26 1245)  Labs: Recent Labs    04/19/20 0509 04/19/20 0721 04/19/20 0928 04/19/20 1140  HGB 14.5  --   --   --   HCT 44.3  --   --   --   PLT 253  --   --   --   CREATININE 1.30*  --   --   --   TROPONINIHS 11 34* 165* 487*    Estimated Creatinine Clearance: 37.3 mL/min (A) (by C-G formula based on SCr of 1.3 mg/dL (H)).   Medical History: Past Medical History:  Diagnosis Date  . Allergic rhinitis   . Allergy   . Anxiety    and panic attacks;pt states that she is claustrophobic  . Asthma   . Atrial fibrillation ([place])    Used to take Coumadin-instructed to stop  . Cataract    bilateral surgery to remove  . Chronic back pain    hx buldging disc  . Colon cancer ([place]) 2007   s/p surgery and chemo  . Constipation    takes Colace prn  . Diabetes mellitus    glimepiride  . DJD (degenerative joint disease)   . Fibromyalgia    but doesn't take any meds  . Gastric ulcer   . GERD (gastroesophageal reflux disease)    uses Tums prn  . Gout    takes Colchicine prn  . History of blood clots    right lung in early 90's  . History of colon polyps   . History of migraine headaches    last migraine 70yrs ago;Pt states she does get sinus headaches  . History of PSVT (paroxysmal supraventricular  tachycardia) no current problems  . History of shingles   . HLD (hyperlipidemia)    diet control - no meds  . Hypertension    takes Micardis and Diltiazem daily  . Osteoarthritis   . Peptic ulcer disease   . Peripheral edema    takes Furosemide every other day  . Sleep apnea    doesn't use CPAP    Medications:  Scheduled:  . aspirin EC  81 mg Oral Daily  . atorvastatin  20 mg Oral Daily  . colchicine  0.6 mg Oral BID  . famotidine  20 mg Oral BID  . heparin  2,500 Units Intravenous Once  . insulin aspart  0-15 Units Subcutaneous TID WC  . insulin aspart  0-5 Units Subcutaneous QHS    Assessment: Patient is a 3 yof that has a long standing hx of afib and is not currently anticoagulated due to pt preference. Pharmacy has been asked to dose heparin at this time while patient considers her options of oral anticoagulants at this time.    Goal of Therapy:  Heparin level 0.3-0.7 units/ml Monitor platelets by anticoagulation  protocol: Yes   Plan:  - Heparin bolus 2500 units IV x 1 dose (reduced due to Lovenox 40mg  given at ~ 1000) - Heparin drip @ 1050 units/hr - Heparin level in ~ 6 hours  - Monitor patient for s/s of bleeding and CBC while on heparin   Duanne Limerick PharmD. BCPS  04/19/2020,1:32 PM

## 2020-04-19 NOTE — Progress Notes (Addendum)
Patient converted to NSR. HR 57-63. EKG obtained to confirm. Cardizem drip stopped per protocol due to HR <60. Notified Dr. Lorin Mercy and Dr. Rudi Rummage (on call for cardiology) via text page.

## 2020-04-19 NOTE — H&P (Signed)
History and Physical    [name] VEH:[id number] DOB: 1940-03-08 DOA: 04/19/2020  PCP: Hoyt Koch, MD Consultants:  Amalia Hailey - podiatry; [facility] - cardiology; Redmond Baseman - ENT Patient coming from:  Home - lives with husband; NOK: [place], [place], [phone]   Chief Complaint:  Dizziness/weakness  HPI: [name] is a 80 y.o. female with medical history significant of OSA not on CPAP; HTN; HLD; DM; colon cancer (2007); chronic back pain; and afib presenting with dizziness/weakness.  She reports that she went to bed about midnight and had a headache nad her jaw was hurting.  She woke up about 3AM to go to the bathroom.  She couldn't go back to sleep and developed CP with pain into her arm and back.  She knew she was in afib but was also concerned maybe she was having a heart attack.  CP is much better now and she still has a headache; this improved with fentanyl.  She prefers medication only rather than cardioversion - it burns her chest and back.  She is not on blood thinners - she "can't function on them".  She isn't able to complete her ADLs, make her feel too bad.  She has only tried Coumadin.  She got a shot of steroids in her knee yesterday at orthopedics.    ED Course: Afib with RVR, on Dilt drip and now better.  Refuses AC because it makes her feel tired.  Does not want cardioversion.  Review of Systems: As per HPI; otherwise review of systems reviewed and negative.   Ambulatory Status:  Ambulates without assistance  COVID Vaccine Status:  Complete  Past Medical History:  Diagnosis Date  . Allergic rhinitis   . Allergy   . Anxiety    and panic attacks;pt states that she is claustrophobic  . Asthma   . Atrial fibrillation ([place])    Used to take Coumadin-instructed to stop  . Bronchitis    in Jan 2013-uses Albuterol prn  . Bronchitis, acute 03-01-11   FINSHED Z-PAK-STILL ON PREDNISONE NOW  . Cataract    bilateral surgery to remove  . Chronic back pain    hx  buldging disc  . Colon cancer ([place]) 2007   s/p surgery and chemo  . Constipation    takes Colace prn  . Diabetes mellitus    glimepiride  . Dizziness    occasionally  . DJD (degenerative joint disease)   . Family history of anesthesia complication    daughter gets PONV  . Family hx of colon cancer   . Fibromyalgia    but doesn't take any meds  . Gastric ulcer   . GERD (gastroesophageal reflux disease)    uses Tums prn  . Gout    takes Colchicine prn  . Hemorrhoids   . History of blood clots    right lung in early 90's  . History of colon polyps   . History of migraine headaches    last migraine 64yr ago;Pt states she does get sinus headaches  . History of PSVT (paroxysmal supraventricular tachycardia) no current problems  . History of shingles   . HLD (hyperlipidemia)    diet control - no meds  . Hypertension    takes Micardis and Diltiazem daily  . Nocturia   . Numbness    left hand  . Osteoarthritis   . Peptic ulcer disease   . Peripheral edema    takes Furosemide every other day  . Peripheral edema  takes Lasix daily  . Rash    on neck and started after she started taking her beta blocker 75yr ago;medical MD has seen this  . Shortness of breath    pt states that she can be sitting as well as exertion and get short of breath  . Sleep apnea    doesn't use CPAP  . Spinal headache   . Urinary frequency   . Urinary frequency   . Yeast infection of the vagina    being treated for this now by dr.fontaine    Past Surgical History:  Procedure Laterality Date  . ANKLE SURGERY     tumor-benign removed left ankle  . ANTERIOR CERVICAL DECOMP/DISCECTOMY FUSION  01/12/2011   Procedure: ANTERIOR CERVICAL DECOMPRESSION/DISCECTOMY FUSION 2 LEVELS;  Surgeon: HCooper RenderPool;  [facility];  Service: Neurosurgery;  Laterality: Bilateral;  Cervical five-six, six-seven anterior cervical discectomy and fusion with allograft and plating  . ANTERIOR CERVICAL  DECOMP/DISCECTOMY FUSION  06/11/2011   Procedure: ANTERIOR CERVICAL DECOMPRESSION/DISCECTOMY FUSION 1 LEVEL/HARDWARE REMOVAL;  Surgeon: HCharlie Pitter MD;  [facility];  Service: Neurosurgery;  Laterality: N/A;  Cervical four-five anterior cervical decompression fusion with allograft and plating; Removal of Cervical five to seven plate  . bilateral cataracts removed    . caesarean section  1966/69/72   x 3  . CARDIAC CATHETERIZATION  90's and 2005  . CARPAL TUNNEL RELEASE Left 11/2013  . cataract surgery  2010  . COLECTOMY  2007   colon cancer  . COLONOSCOPY    . DILATION AND CURETTAGE OF UTERUS  02-19-11   & POLYP REMOVAL  . GANGLION CYST EXCISION  > [place]   removed from left pointer finger  . HEEL SPUR SURGERY Right 1992   x 2  . HYSTEROSCOPY WITH D & C  02/19/2011   Procedure: DILATATION AND CURETTAGE /HYSTEROSCOPY;  Surgeon: TiAnastasio AuerbachMD;  [facility];  Service: Gynecology;  Laterality: N/A;  requests one hour  . JOINT REPLACEMENT Left    knee  . knee arthrosocpy     bil;couple of years before knee replacement  . KNEE SURGERY     left TKA  . LUBrendaURGERY  2008  . port a cath placed  2008  . port a cath removed  2008  . pyelonidal cystectomy     at age 80. SHOULDER ARTHROSCOPY WITH SUBACROMIAL DECOMPRESSION AND OPEN ROTATOR C Right 03/16/2013   Procedure: RIGHT SHOULDER ARTHROSCOPY WITH SUBACROMIAL DECOMPRESSION AND OPEN ROTATOR CUFF REPAIR, OPEN DISTAL CLAVICLE  RESECTION POSSIBLE BICEP TENODESIS ;  Surgeon: StAugustin SchoolingMD;  [facility] Service: Orth[facility]ics;  Laterality: Right;  . TRIGGER FINGER RELEASE Left    thumb, tendonitis, tumor removed  . TUBAL LIGATION  1972    Social History   Socioeconomic History  . Marital status: Married    Spouse name: Not on file  . Number of children: 3  . Years of education: Not on file  . Highest education level: Not on file  Occupational History  . Occupation: retired  Tobacco Use   . Smoking status: Former Smoker    Packs/day: 1.50    Years: 30.00    Pack years: 45.00    Types: Cigarettes    Quit date: 02/23/1991    Years since quitting: 29.1  . Smokeless tobacco: Never Used  Vaping Use  . Vaping Use: Never used  Substance and Sexual Activity  . Alcohol use:  No    Alcohol/week: 0.0 standard drinks  . Drug use: No  . Sexual activity: Yes    Partners: Male    Birth control/protection: Post-menopausal  Other Topics Concern  . Not on file  Social History Narrative   Regular exercise- no.   Social Determinants of Health   Financial Resource Strain: Not on file  Food Insecurity: Not on file  Transportation Needs: Not on file  Physical Activity: Not on file  Stress: Not on file  Social Connections: Not on file  Intimate Partner Violence: Not on file    Allergies  Allergen Reactions  . Benazepril Other (See Comments)    coughing  . Lopressor [Metoprolol] Other (See Comments)    nervousness  . Adhesive [Tape] Other (See Comments)    redness  . Morphine Other (See Comments)       Makes you feel bad all over  . Nifedipine Hives and Itching  . Piroxicam Hives and Itching    Family History  Problem Relation Age of Onset  . Heart attack Mother   . Stroke Mother   . Hypertension Mother   . Heart disease Father   . Hypertension Sister   . Kidney cancer Sister        left  . Bladder Cancer Brother   . Liver cancer Brother        spread to colon  . Coronary artery disease Other   . Anesthesia problems Neg Hx   . Hypotension Neg Hx   . Malignant hyperthermia Neg Hx   . Pseudochol deficiency Neg Hx   . Stomach cancer Neg Hx   . Rectal cancer Neg Hx     Prior to Admission medications   Medication Sig Start Date End Date Taking? Authorizing Provider  ACCU-CHEK AVIVA PLUS test strip TEST BLOOD SUGAR TWICE DAILY 03/05/20   Hoyt Koch, MD  albuterol (PROVENTIL HFA;VENTOLIN HFA) 108 (90 Base) MCG/ACT inhaler Inhale 2 puffs into the lungs  every 6 (six) hours as needed for wheezing or shortness of breath. 04/11/18   Hoyt Koch, MD  aspirin 81 MG tablet Take 81 mg by mouth 2 (two) times a week.    [provider]  baclofen (LIORESAL) 10 MG tablet TAKE 1 TABLET BY MOUTH TWICE A DAY AS NEEDED FOR MUSCLE SPASMS 05/21/19   Hoyt Koch, MD  Blood Glucose Monitoring Suppl (ACCU-CHEK AVIVA PLUS) w/Device KIT Used to check blood sugars twice daily 08/22/17   Hoyt Koch, MD  Colchicine (MITIGARE) 0.6 MG CAPS Take 1 tablet by mouth 2 (two) times daily. 03/05/20   Janith Lima, MD  diltiazem (CARDIZEM CD) 120 MG 24 hr capsule Take 1 capsule (120 mg total) by mouth daily. Take in addition to diltiazem 240 mg for a total of 360 mg daily.  May take one additional tablet as needed for breakthrough afib 01/28/20   Fay Records, MD  diltiazem (CARDIZEM CD) 240 MG 24 hr capsule Take 1 capsule (240 mg total) by mouth daily. Take in addition to diltiazem 120 for total of 360 mg daily 01/28/20   Fay Records, MD  famotidine (PEPCID) 20 MG tablet TAKE 1 TABLET BY MOUTH TWICE A DAY 04/10/20   Hoyt Koch, MD  hydrochlorothiazide (MICROZIDE) 12.5 MG capsule Take 12.5 mg by mouth as needed.    [provider]  Lancets (ACCU-CHEK SOFT TOUCH) lancets Use to check blood sugars twice day 08/22/17   Hoyt Koch, MD  loperamide (  IMODIUM) 2 MG capsule Take 4 mg by mouth as needed for diarrhea or loose stools.    [provider]  losartan (COZAAR) 100 MG tablet TAKE 1 TABLET EVERY DAY (NEED MD APPOINTMENT FOR REFILLS) 04/04/20   Hoyt Koch, MD  traMADol (ULTRAM) 50 MG tablet Take 1 tablet (50 mg total) by mouth daily as needed. 02/04/20   Hoyt Koch, MD    Physical Exam: Vitals:   04/19/20 0545 04/19/20 0610 04/19/20 0630 04/19/20 0650  BP: 119/90 103/83 114/86 (!) 101/56  Pulse: 71 (!) 141 (!) 143 (!) 132  Resp: 18 18 16 20   Temp:      TempSrc:      SpO2: 100% 99% 99%  98%  Weight:      Height:         . General:  Appears calm and comfortable and is in NAD . Eyes:  PERRL, EOMI, normal lids, iris . ENT:  grossly normal hearing, lips & tongue, mmm . Neck:  no LAD, masses or thyromegaly . Cardiovascular:  Irregularly irregular with tachycardia, no r/g. No LE edema.  [name] Respiratory:   CTA bilaterally with no wheezes/rales/rhonchi.  Normal respiratory effort. . Abdomen:  soft, NT, ND, NABS . Skin:  no rash or induration seen on limited exam . Musculoskeletal:  grossly normal tone BUE/BLE, good ROM, no bony abnormality . Psychiatric:  grossly normal mood and affect, speech fluent and appropriate, AOx3 . Neurologic:  CN 2-12 grossly intact, moves all extremities in coordinated fashion    Radiological Exams on Admission: Independently reviewed - see discussion in A/P where applicable  DG Chest Portable 1 View  Result Date: 04/19/2020 CLINICAL DATA:  80 year old female with chest pain, atrial fibrillation, RVR. EXAM: PORTABLE CHEST 1 VIEW COMPARISON:  Portable chest 05/22/2015 and earlier. FINDINGS: Portable AP upright view at 0516 hours. Stable mild tortuosity of the descending thoracic aorta. Other mediastinal contours are within normal limits. Visualized tracheal air column is within normal limits. Allowing for portable technique the lungs are clear. No pneumothorax. Negative visible bowel gas and osseous structures. IMPRESSION: No acute cardiopulmonary abnormality. Electronically Signed   By: Genevie Ann M.D.   On: 04/19/2020 05:59    EKG: Independently reviewed.  Afib with RVR with rate 159; marked ST changes likely associated with rate   Labs on Admission: I have personally reviewed the available labs and imaging studies at the time of the admission.  Pertinent labs:   Glucose 237 BUN 34/Creatinine 1.3/GFR 42 - stable HS troponin 11 Normal CBC   Assessment/Plan Active Problems:   * No active hospital problems. *   Afib with RVR -Patient  presenting with recurrent afib, now with RVR. -This may be related to the steroid joint injection she received yesterday.  -Cardioversion could be considered but the patient is not on [facility] (her preference) and she is opposed to this option -Will plan to place in observation status in SDU for Diltiazem drip as per protocol with plan to transition to PO Diltiazem once heart rate is controlled (resting HR 110 or lower).  -If she does not convert and/or obtain rate control, cardioversion may need to be considered -For now, will plan on outpatient cardiology f/u unless cardioversion needs to be considered. -HS troponin negative initially but repeat has a positive delta; this is likely associated with demand ischemia but will continue to trend to peak. -Will give ASA 81 mg PO daily - she is only taking twice weekly currently and this really  increases her risk of CVA -Treat chest pain with NTG prn.   -Will request Echocardiogram for further evaluation  -Will risk stratify with FLP; will also order TSH -Repeat EKG in AM  -CHA2DS2-VASc Score is 5 (7.2-10% risk per year) and so patient would benefit from oral anticoagulation. There is evidence of net benefit even in the extremely elderly population. -She has refused AC due to how it made her feel in the past, but she has not tried DOAC therapy and so this should be a consideration. -Watchman procedure (percutaneous LAA occlusion) could also be considered for patients who are unable to tolerate long-term AC (prefer 6 weeks of AC to prevent device thrombosis)  HTN -Hold PO Dilt and transition back once she meets criteria -Hold PO HCTZ and Cozaar for now while on Dilt drip  HLD -Start Lipitor 20 mg daily -Lipids were checked in 12/21 (TC 236, HDL 59, LDL139, TG 117) so will not repeat at this time  DM -Last A1c was 7.1, indicating suboptimal control -She is likely to benefit from oral outpatient therapy -Will cover with moderate-scale SSI for  now  Stage 3b CKD -Appears to be stable at this time -Will continue to follow  OSA -Not on CPAP  Colon cancer -Remote -s/p surgery and chemo  COPD -Continue Albuterol prn  Chronic back pain -Continue Ultram, increase prn frequency -I have reviewed this patient in the [place] Controlled Substances Reporting System.  She is receiving medications from two providers but appears to be taking them as prescribed. -She is not at particularly high risk of opioid misuse, diversion, or overdose.  Obesity -Body mass index is 34.01 kg/m..  -Weight loss should be encouraged -Outpatient PCP/bariatric medicine f/u encouraged    Note: This patient has been tested and is pending for the novel coronavirus COVID-19. She has been fully vaccinated against COVID-19.    DVT prophylaxis: Lovenox  Code Status:  Full - confirmed with patient Family Communication: None present Disposition Plan:  The patient is from: home  Anticipated d/c is to: home without [facility] services  Anticipated d/c date will depend on clinical response to treatment, but possibly as early as tomorrow if she has excellent response to treatment  Patient is currently: acutely ill Consults called: None Admission status: It is my clinical opinion that referral for OBSERVATION is reasonable and necessary in this patient based on the above information provided. The aforementioned taken together are felt to place the patient at high risk for further clinical deterioration. However it is anticipated that the patient may be medically stable for discharge from the hospital within 24 to 48 hours.    Karmen Bongo MD [facility]   How to contact the [facility] Attending or Consulting provider [place] or covering provider during after hours [place], for this patient?  1. Check the care team in [facility] and look for a) attending/consulting TR[facility] provider listed and b) the [facility] team listed 2. Log into [email or web address] and use [place]'s universal  password to access. If you do not have the password, please contact the hospital operator. 3. Locate the [facility] provider you are looking for under [facility] and page to a number that you can be directly reached. 4. If you still have difficulty reaching the provider, please page the [facility] (Director on Call) for the Hospitalists listed on amion for assistance.   04/19/2020, 7:31 AM

## 2020-04-19 NOTE — Consult Note (Addendum)
Cardiology Consultation:   Patient ID: [name] MRN: [id number]; DOB: 1941-01-18  Admit date: 04/19/2020 Date of Consult: 04/19/2020  PCP:  Hoyt Koch, [facility]  Cardiologist:  Dorris Carnes, MD   Patient Profile:   [name] is a 80 y.o. female with a hx of paroxysmal atrial fibrillation, refused anticoagulation, hypertension, hyperlipidemia, diabetes mellitus, peptic ulcer disease, obstructive sleep apnea, colon cancer s/p resection in 2007, mild AS, and remote pulmonary embolism remain 1990s who is being seen today for the evaluation of atrial fibrillation with rapid ventricular rate and elevated troponin at the request of Dr. Lorin Mercy.  Longstanding history of paroxysmal atrial fibrillation.  Previously on Coumadin.  Tells that made her felt weak.  Intermittent symptomatic spell for which she takes extra diltiazem 120 mg.  Echocardiogram March 2017 with LV function of 65 to 02%, grade 1 diastolic dysfunction, mild aortic stenosis with mean gradient of 8 mmHg.  Last seen by Dr. Harrington Challenger December 2021.  History of Present Illness:   [name] was doing well up until midnight when starting to feeling headache.  She woke up about 3 AM to use bathroom.  She could not able to go to sleep afterwards.  She started to felt chest pressure with palpitation and dizziness/weakness.  She knew she was in atrial fibrillation.  Symptoms lasted for 2 hours and EMS was called.  She was found in atrial fibrillation with rapid ventricular rate with rate up to 180s.  Patient reports mild fluttering sensation each week here and they are lasting for few minutes.  She has long-lasting atrial fibrillation once every few months which resolves in 2 to 4 hours.  Patient is recently dealing with right knee pain and had fluid drained yesterday by Dr. Wynelle Link.  She also received steroid injection.  Upon arrival to ER she was given IV Cardizem 15 mg and then started on  drip.  Her heart rate improved with improved chest pressure.  Headache persisted.  Denies weakness on 1 side of the body or slurred speech.  High-sensitivity troponin 11>>34>>164. Creatinine 1.3 TSH normal Pending Covid Chest x-ray without acute cardiopulmonary disease  Past Medical History:  Diagnosis Date  . Allergic rhinitis   . Allergy   . Anxiety    and panic attacks;pt states that she is claustrophobic  . Asthma   . Atrial fibrillation ([place])    Used to take Coumadin-instructed to stop  . Cataract    bilateral surgery to remove  . Chronic back pain    hx buldging disc  . Colon cancer ([place]) 2007   s/p surgery and chemo  . Constipation    takes Colace prn  . Diabetes mellitus    glimepiride  . DJD (degenerative joint disease)   . Fibromyalgia    but doesn't take any meds  . Gastric ulcer   . GERD (gastroesophageal reflux disease)    uses Tums prn  . Gout    takes Colchicine prn  . History of blood clots    right lung in early 90's  . History of colon polyps   . History of migraine headaches    last migraine 104yrs ago;Pt states she does get sinus headaches  . History of PSVT (paroxysmal supraventricular tachycardia) no current problems  . History of shingles   . HLD (hyperlipidemia)    diet control - no meds  . Hypertension    takes Micardis and Diltiazem daily  . Osteoarthritis   .  Peptic ulcer disease   . Peripheral edema    takes Furosemide every other day  . Sleep apnea    doesn't use CPAP    Past Surgical History:  Procedure Laterality Date  . ANKLE SURGERY     tumor-benign removed left ankle  . ANTERIOR CERVICAL DECOMP/DISCECTOMY FUSION  01/12/2011   Procedure: ANTERIOR CERVICAL DECOMPRESSION/DISCECTOMY FUSION 2 LEVELS;  Surgeon: Cooper Render Pool;  Location: [facility];  Service: Neurosurgery;  Laterality: Bilateral;  Cervical five-six, six-seven anterior cervical discectomy and fusion with allograft and plating  . ANTERIOR CERVICAL DECOMP/DISCECTOMY  FUSION  06/11/2011   Procedure: ANTERIOR CERVICAL DECOMPRESSION/DISCECTOMY FUSION 1 LEVEL/HARDWARE REMOVAL;  Surgeon: Charlie Pitter, MD;  Location: [facility];  Service: Neurosurgery;  Laterality: N/A;  Cervical four-five anterior cervical decompression fusion with allograft and plating; Removal of Cervical five to seven plate  . bilateral cataracts removed    . caesarean section  1966/69/72   x 3  . CARDIAC CATHETERIZATION  90's and 2005  . CARPAL TUNNEL RELEASE Left 11/2013  . cataract surgery  2010  . COLECTOMY  2007   colon cancer  . COLONOSCOPY    . DILATION AND CURETTAGE OF UTERUS  02-19-11   & POLYP REMOVAL  . GANGLION CYST EXCISION  > 51yrs ago   removed from left pointer finger  . HEEL SPUR SURGERY Right 1992   x 2  . HYSTEROSCOPY WITH D & C  02/19/2011   Procedure: DILATATION AND CURETTAGE /HYSTEROSCOPY;  Surgeon: Anastasio Auerbach, MD;  Location: [facility];  Service: Gynecology;  Laterality: N/A;  requests one hour  . JOINT REPLACEMENT Left    knee  . knee arthrosocpy     bil;couple of years before knee replacement  . KNEE SURGERY     left TKA  . [place] SURGERY  2008  . port a cath placed  2008  . port a cath removed  2008  . pyelonidal cystectomy     at age 22  . SHOULDER ARTHROSCOPY WITH SUBACROMIAL DECOMPRESSION AND OPEN ROTATOR C Right 03/16/2013   Procedure: RIGHT SHOULDER ARTHROSCOPY WITH SUBACROMIAL DECOMPRESSION AND OPEN ROTATOR CUFF REPAIR, OPEN DISTAL CLAVICLE  RESECTION POSSIBLE BICEP TENODESIS ;  Surgeon: Augustin Schooling, MD;  Location: [facility];  Service: Orthopedics;  Laterality: Right;  . TRIGGER FINGER RELEASE Left    thumb, tendonitis, tumor removed  . TUBAL LIGATION  1972     Inpatient Medications: Scheduled Meds: . aspirin EC  81 mg Oral Daily  . atorvastatin  20 mg Oral Daily  . colchicine  0.6 mg Oral BID  . enoxaparin (LOVENOX) injection  40 mg Subcutaneous Q24H  . famotidine  20 mg Oral BID  . insulin aspart  0-15 Units Subcutaneous TID WC   . insulin aspart  0-5 Units Subcutaneous QHS   Continuous Infusions: . diltiazem (CARDIZEM) infusion 5 mg/hr (04/19/20 0955)   PRN Meds: acetaminophen, albuterol, baclofen, nitroGLYCERIN, ondansetron (ZOFRAN) IV, traMADol  Allergies:    Allergies  Allergen Reactions  . Benazepril Other (See Comments)    coughing  . Lopressor [Metoprolol] Other (See Comments)    nervousness  . Adhesive [Tape] Other (See Comments)    redness  . Morphine Other (See Comments)       Makes you feel bad all over  . Nifedipine Hives and Itching  . Piroxicam Hives and Itching    Social History:   Social History   Socioeconomic History  . Marital status: Married  Spouse name: Not on file  . Number of children: 3  . Years of education: Not on file  . Highest education level: Not on file  Occupational History  . Occupation: retired  Tobacco Use  . Smoking status: Former Smoker    Packs/day: 1.50    Years: 30.00    Pack years: 45.00    Types: Cigarettes    Quit date: 02/23/1991    Years since quitting: 29.1  . Smokeless tobacco: Never Used  Vaping Use  . Vaping Use: Never used  Substance and Sexual Activity  . Alcohol use: No    Alcohol/week: 0.0 standard drinks  . Drug use: No  . Sexual activity: Yes    Partners: Male    Birth control/protection: Post-menopausal  Other Topics Concern  . Not on file  Social History Narrative   Regular exercise- no.   Social Determinants of Health   Financial Resource Strain: Not on file  Food Insecurity: Not on file  Transportation Needs: Not on file  Physical Activity: Not on file  Stress: Not on file  Social Connections: Not on file  Intimate Partner Violence: Not on file    Family History:   Family History  Problem Relation Age of Onset  . Heart attack Mother   . Stroke Mother   . Hypertension Mother   . Heart disease Father   . Hypertension Sister   . Kidney cancer Sister        left  . Bladder Cancer Brother   . Liver cancer  Brother        spread to colon  . Coronary artery disease Other   . Anesthesia problems Neg Hx   . Hypotension Neg Hx   . Malignant hyperthermia Neg Hx   . Pseudochol deficiency Neg Hx   . Stomach cancer Neg Hx   . Rectal cancer Neg Hx      ROS:  Please see the history of present illness.  All other ROS reviewed and negative.     Physical Exam/Data:   Vitals:   04/19/20 1045 04/19/20 1115 04/19/20 1200 04/19/20 1245  BP: 93/70 117/85 115/71 127/79  Pulse: (!) 114 89 (!) 118 72  Resp: 19 18 15 14   Temp:      TempSrc:      SpO2: 99% 98% 99% 97%  Weight:      Height:       No intake or output data in the 24 hours ending 04/19/20 1328 Last 3 Weights 04/19/2020 03/03/2020 02/04/2020  Weight (lbs) 192 lb 201 lb 9.6 oz 205 lb 12.8 oz  Weight (kg) 87.091 kg 91.445 kg 93.35 kg     Body mass index is 34.01 kg/m.  General:  Well nourished, well developed, in no acute distress HEENT: normal Lymph: no adenopathy Neck: no JVD Endocrine:  No thryomegaly Vascular: No carotid bruits; FA pulses 2+ bilaterally without bruits  Cardiac:  normal S1, S2; irregularly irregular, +murmur  Lungs:  clear to auscultation bilaterally, no wheezing, rhonchi or rales  Abd: soft, nontender, no hepatomegaly  Ext: trace edema Musculoskeletal:  No deformities, BUE and BLE strength normal and equal Skin: warm and dry  Neuro:  CNs 2-12 intact, no focal abnormalities noted Psych:  Normal affect   EKG:  The EKG was personally reviewed and demonstrates: Atrial fibrillation, heart rate 159 bpm, ST changes in inferior lateral leads Telemetry:  Telemetry was personally reviewed and demonstrates: Atrial fibrillation at rate of 100s  Relevant CV Studies:  Echo  04/2015 Study Conclusions   - Left ventricle: The cavity size was normal. There was moderate  focal basal and mild concentric hypertrophy of the left  ventricle. Systolic function was vigorous. The estimated ejection  fraction was in the range  of 65% to 70%. Wall motion was normal;  there were no regional wall motion abnormalities. Doppler  parameters are consistent with abnormal left ventricular  relaxation (grade 1 diastolic dysfunction). Doppler parameters  are consistent with elevated ventricular end-diastolic filling  pressure.  - Aortic valve: Valve mobility was restricted. There was mild  stenosis. There was mild regurgitation. Mean gradient (S): 8 mm  Hg. Peak gradient (S): 17 mm Hg.  - Aortic root: The aortic root was normal in size.  - Mitral valve: Calcified annulus. Mildly thickened leaflets .  - Right atrium: The atrium was mildly dilated.  - Tricuspid valve: There was mild regurgitation.  - Pulmonary arteries: Systolic pressure was within the normal  range.  - Inferior vena cava: The vessel was normal in size.   Impressions:   - Aortic valve is thickened and calcified with limited leaflet  opening.  There is mild aortic stenosis that is most probably  underestimated sec to suboptimal angle during acquisition.  Mild aortic regirgitation.   Laboratory Data:  High Sensitivity Troponin:   Recent Labs  Lab 04/19/20 0509 04/19/20 0721 04/19/20 0928 04/19/20 1140  TROPONINIHS 11 34* 165* 487*     Chemistry Recent Labs  Lab 04/19/20 0509  NA 137  K 3.8  CL 104  CO2 21*  GLUCOSE 237*  BUN 34*  CREATININE 1.30*  CALCIUM 10.0  GFRNONAA 42*  ANIONGAP 12    Hematology Recent Labs  Lab 04/19/20 0509  WBC 10.4  RBC 4.97  HGB 14.5  HCT 44.3  MCV 89.1  MCH 29.2  MCHC 32.7  RDW 14.1  PLT 253   Radiology/Studies:  DG Chest Portable 1 View  Result Date: 04/19/2020 CLINICAL DATA:  80 year old female with chest pain, atrial fibrillation, RVR. EXAM: PORTABLE CHEST 1 VIEW COMPARISON:  Portable chest 05/22/2015 and earlier. FINDINGS: Portable AP upright view at 0516 hours. Stable mild tortuosity of the descending thoracic aorta. Other mediastinal contours are within normal  limits. Visualized tracheal air column is within normal limits. Allowing for portable technique the lungs are clear. No pneumothorax. Negative visible bowel gas and osseous structures. IMPRESSION: No acute cardiopulmonary abnormality. Electronically Signed   By: Genevie Ann M.D.   On: 04/19/2020 05:59    Assessment and Plan:   1. Atrial fibrillation with rapid ventricular rate - Patient with longstanding history of intermittent atrial fibrillation and she has refused anticoagulation. -Reports feeling weakness on Coumadin.  Never tried NOAC. -Patient was admitted and started on IV Cardizem with improved rate.  Her palpitation and chest discomfort has improved.  Continues to have headache.  No other strokelike symptoms. -Discussed Xarelto and Eliquis for anticoagulation.  She is thinking about it.  She had fluid drained from her right knee yesterday with steroid shot placement. -TSH normal -Pending echocardiogram -Continue Cardizem drip, currently at rate of 5 mg/h  2.  Elevated troponin with chest tightness -High-sensitivity troponin 11>>34>>165 -Rate related changes on EKG -Could be demand ischemia of A. fib RVR -Pending echocardiogram  3.  Hypertension -Blood pressure stable on Cardizem drip -Home losartan on hold  4.  Diabetes mellitus -Per primary team  Risk Assessment/Risk Scores:   TIMI Risk Score for Unstable Angina or Non-ST Elevation MI:   The patient's TIMI  risk score is 3, which indicates a 13% risk of all cause mortality, new or recurrent myocardial infarction or need for urgent revascularization in the next 14 days.{   CHA2DS2-VASc Score = 5  This indicates a 7.2% annual risk of stroke. The patient's score is based upon: CHF History: No HTN History: Yes Diabetes History: Yes Stroke History: No Vascular Disease History: No Age Score: 2 Gender Score: 1    For questions or updates, please contact [place] Please consult [email or web address] for contact info under     SignedLeanor Kail, PA  04/19/2020 1:28 PM   As above, patient seen and examined.  Briefly she is a 80 year old female with past medical history of paroxysmal atrial fibrillation previously refused anticoagulation, diabetes mellitus, hypertension, hyperlipidemia, obstructive sleep apnea, colon cancer status post resection, mild aortic stenosis, previous remote pulmonary embolus admitted with atrial fibrillation and chest pain.  Most recent echocardiogram in 2017 showed normal LV function and mild aortic stenosis.  Patient typically has dyspnea with more vigorous activities but not routine activities.  No orthopnea, PND, pedal edema, exertional chest pain or syncope.  She has episodes of atrial fibrillation every 3 months approximately.  She awoke at 430 this morning with palpitations.  She also complained of chest pain that is worse than usual with her atrial fibrillation.  It radiated to her neck and left upper extremity.  She also had dyspnea but no nausea.  Her symptoms persisted so she presented to the emergency room.  She was placed on IV Cardizem with improvement in her heart rate and now her symptoms have improved.  Cardiology asked to evaluate. Initial troponin 11 with follow-up being 34, 165 and 487.  TSH 0.774 BUN 34 and creatinine 1.3.  Electrocardiogram shows atrial fibrillation with rapid ventricular response and inferolateral ST depression.  1 paroxysmal atrial fibrillation-patient presents with recurrent atrial fibrillation.  We will continue Cardizem for rate control.  Schedule echocardiogram to reassess LV function.  TSH is normal.  Long discussion today concerning anticoagulation. CHADSvasc 5.  She is willing to proceed with anticoagulation.  We will begin IV heparin today.  We will treat with apixaban once all procedures complete.  She is having atrial fibrillation frequently.  I think she will require antiarrhythmic to maintain sinus rhythm.  Will review with electrophysiology  whether she would benefit from Tikosyn or amiodarone.  I doubt she will be a good candidate for flecainide as I think she would likely have coronary disease.  If she does not convert on her own will likely plan TEE guided cardioversion next week.  2 elevated troponin-patient's troponin has trended up and she states she had chest pain worse compared to previous.  She also was noted to have mild ST depression in the inferior leads with elevated heart rate.  She also has multiple risk factors including history of diabetes mellitus, hypertension, hyperlipidemia and family history of coronary disease in her mother who had myocardial infarction at age 19.  She will likely require catheterization.  We will continue to cycle troponins to better assess.  We will continue aspirin 81 mg daily and lipitor.  We will also add IV heparin.  Echocardiogram to assess LV function and wall motion.  If catheterization required we will need to hydrate prior to procedure given mild renal insufficiency.  3 history of mild aortic stenosis-we will plan follow-up echocardiogram.  4 hyperlipidemia-continue Lipitor.  5 hypertension-we will continue Cardizem and follow.  Resume ARB once cleared renal function stable.  Aaron Edelman  Stanford Breed, MD

## 2020-04-19 NOTE — ED Notes (Signed)
Critical results  Trop 34 given to Dr Lorin Mercy at pt bedside

## 2020-04-19 NOTE — ED Provider Notes (Signed)
[facility] EMERGENCY DEPARTMENT Provider Note   CSN: [id number] Arrival date & time: 04/19/20  0446     History Chief Complaint  Patient presents with  . Atrial Fibrillation    [name] is a 80 y.o. female.   Chest Pain Pain location:  Substernal area and L chest Pain quality: aching and throbbing   Pain radiates to:  Does not radiate Pain severity:  Mild Timing:  Constant Progression:  Improving Chronicity:  Recurrent Relieved by:  None tried Worsened by:  Nothing      Past Medical History:  Diagnosis Date  . Allergic rhinitis   . Allergy   . Anxiety    and panic attacks;pt states that she is claustrophobic  . Asthma   . Atrial fibrillation ([place])    Used to take Coumadin-instructed to stop  . Cataract    bilateral surgery to remove  . Chronic back pain    hx buldging disc  . Colon cancer ([place]) 2007   s/p surgery and chemo  . Constipation    takes Colace prn  . Diabetes mellitus    glimepiride  . DJD (degenerative joint disease)   . Fibromyalgia    but doesn't take any meds  . Gastric ulcer   . GERD (gastroesophageal reflux disease)    uses Tums prn  . Gout    takes Colchicine prn  . History of blood clots    right lung in early 90's  . History of colon polyps   . History of migraine headaches    last migraine 35yr ago;Pt states she does get sinus headaches  . History of PSVT (paroxysmal supraventricular tachycardia) no current problems  . History of shingles   . HLD (hyperlipidemia)    diet control - no meds  . Hypertension    takes Micardis and Diltiazem daily  . Osteoarthritis   . Peptic ulcer disease   . Peripheral edema    takes Furosemide every other day  . Sleep apnea    doesn't use CPAP    Patient Active Problem List   Diagnosis Date Noted  . Atrial fibrillation with RVR ([facility] 04/19/2020  . Gouty arthritis of left foot 03/05/2020  . Pain and swelling of toe of left foot 03/05/2020  . Generalized  abdominal pain 03/03/2020  . Right hip pain 02/04/2020  . Right foot pain 02/04/2020  . Frequent urination 08/10/2018  . Breast pain, right 04/14/2018  . Leg pain 08/05/2017  . Morbid obesity ([facility] 06/02/2016  . Renal cyst 01/12/2016  . Chronic back pain 05/23/2015  . PAF (paroxysmal atrial fibrillation) ([place] 05/22/2015  . Degeneration of intervertebral disc of lumbosacral region 04/11/2015  . Fibromyalgia 04/11/2015  . Nausea 08/16/2014  . Hyperlipidemia associated with type 2 diabetes mellitus ([facility] 02/10/2011  . SLEEP APNEA, OBSTRUCTIVE 04/24/2010  . Type 2 diabetes mellitus with hyperlipidemia ([facility] 12/25/2008  . History of malignant neoplasm of large intestine 12/25/2008  . Essential hypertension 01/03/2007  . Asthma 01/03/2007    Past Surgical History:  Procedure Laterality Date  . ANKLE SURGERY     tumor-benign removed left ankle  . ANTERIOR CERVICAL DECOMP/DISCECTOMY FUSION  01/12/2011   Procedure: ANTERIOR CERVICAL DECOMPRESSION/DISCECTOMY FUSION 2 LEVELS;  Surgeon: HCooper RenderPool;  [facility];  Service: Neurosurgery;  Laterality: Bilateral;  Cervical five-six, six-seven anterior cervical discectomy and fusion with allograft and plating  . ANTERIOR CERVICAL DECOMP/DISCECTOMY FUSION  06/11/2011   Procedure: ANTERIOR CERVICAL DECOMPRESSION/DISCECTOMY FUSION 1 LEVEL/HARDWARE REMOVAL;  Surgeon: Charlie Pitter, MD;  Location: [facility];  Service: Neurosurgery;  Laterality: N/A;  Cervical four-five anterior cervical decompression fusion with allograft and plating; Removal of Cervical five to seven plate  . bilateral cataracts removed    . caesarean section  1966/69/72   x 3  . CARDIAC CATHETERIZATION  90's and 2005  . CARPAL TUNNEL RELEASE Left 11/2013  . cataract surgery  2010  . COLECTOMY  2007   colon cancer  . COLONOSCOPY    . DILATION AND CURETTAGE OF UTERUS  02-19-11   & POLYP REMOVAL  . GANGLION CYST EXCISION  > 75yr ago   removed from left pointer  finger  . HEEL SPUR SURGERY Right 1992   x 2  . HYSTEROSCOPY WITH D & C  02/19/2011   Procedure: DILATATION AND CURETTAGE /HYSTEROSCOPY;  Surgeon: TAnastasio Auerbach MD;  [facility];  Service: Gynecology;  Laterality: N/A;  requests one hour  . JOINT REPLACEMENT Left    knee  . knee arthrosocpy     bil;couple of years before knee replacement  . KNEE SURGERY     left TKA  . [facility]  2008  . port a cath placed  2008  . port a cath removed  2008  . pyelonidal cystectomy     at age 80 . SHOULDER ARTHROSCOPY WITH SUBACROMIAL DECOMPRESSION AND OPEN ROTATOR C Right 03/16/2013   Procedure: RIGHT SHOULDER ARTHROSCOPY WITH SUBACROMIAL DECOMPRESSION AND OPEN ROTATOR CUFF REPAIR, OPEN DISTAL CLAVICLE  RESECTION POSSIBLE BICEP TENODESIS ;  Surgeon: SAugustin Schooling MD;  [facility]  Service: Orthopedics;  Laterality: Right;  . TRIGGER FINGER RELEASE Left    thumb, tendonitis, tumor removed  . TUBAL LIGATION  1972     OB History    Gravida  3   Para  3   Term      Preterm      AB      Living  3     SAB      IAB      Ectopic      Multiple      Live Births              Family History  Problem Relation Age of Onset  . Heart attack Mother   . Stroke Mother   . Hypertension Mother   . Heart disease Father   . Hypertension Sister   . Kidney cancer Sister        left  . Bladder Cancer Brother   . Liver cancer Brother        spread to colon  . Coronary artery disease Other   . Anesthesia problems Neg Hx   . Hypotension Neg Hx   . Malignant hyperthermia Neg Hx   . Pseudochol deficiency Neg Hx   . Stomach cancer Neg Hx   . Rectal cancer Neg Hx     Social History   Tobacco Use  . Smoking status: Former Smoker    Packs/day: 1.50    Years: 30.00    Pack years: 45.00    Types: Cigarettes    Quit date: 02/23/1991    Years since quitting: 29.1  . Smokeless tobacco: Never Used  Vaping Use  . Vaping Use: Never used  Substance Use Topics  .  Alcohol use: No    Alcohol/week: 0.0 standard drinks  . Drug use: No    Home Medications Prior to Admission medications   Medication  Sig Start Date End Date Taking? Authorizing Provider  ACCU-CHEK AVIVA PLUS test strip TEST BLOOD SUGAR TWICE DAILY 03/05/20   Hoyt Koch, MD  albuterol (PROVENTIL HFA;VENTOLIN HFA) 108 (90 Base) MCG/ACT inhaler Inhale 2 puffs into the lungs every 6 (six) hours as needed for wheezing or shortness of breath. 04/11/18   Hoyt Koch, MD  aspirin 81 MG tablet Take 81 mg by mouth 2 (two) times a week.    [provider]  baclofen (LIORESAL) 10 MG tablet TAKE 1 TABLET BY MOUTH TWICE A DAY AS NEEDED FOR MUSCLE SPASMS 05/21/19   Hoyt Koch, MD  Blood Glucose Monitoring Suppl (ACCU-CHEK AVIVA PLUS) w/Device KIT Used to check blood sugars twice daily 08/22/17   Hoyt Koch, MD  Colchicine (MITIGARE) 0.6 MG CAPS Take 1 tablet by mouth 2 (two) times daily. 03/05/20   Janith Lima, MD  diltiazem (CARDIZEM CD) 120 MG 24 hr capsule Take 1 capsule (120 mg total) by mouth daily. Take in addition to diltiazem 240 mg for a total of 360 mg daily.  May take one additional tablet as needed for breakthrough afib 01/28/20   Fay Records, MD  diltiazem (CARDIZEM CD) 240 MG 24 hr capsule Take 1 capsule (240 mg total) by mouth daily. Take in addition to diltiazem 120 for total of 360 mg daily 01/28/20   Fay Records, MD  famotidine (PEPCID) 20 MG tablet TAKE 1 TABLET BY MOUTH TWICE A DAY 04/10/20   Hoyt Koch, MD  hydrochlorothiazide (MICROZIDE) 12.5 MG capsule Take 12.5 mg by mouth as needed.    [provider]  Lancets (ACCU-CHEK SOFT TOUCH) lancets Use to check blood sugars twice day 08/22/17   Hoyt Koch, MD  loperamide (IMODIUM) 2 MG capsule Take 4 mg by mouth as needed for diarrhea or loose stools.    [provider]  losartan (COZAAR) 100 MG tablet TAKE 1 TABLET EVERY DAY (NEED MD APPOINTMENT FOR  REFILLS) 04/04/20   Hoyt Koch, MD  traMADol (ULTRAM) 50 MG tablet Take 1 tablet (50 mg total) by mouth daily as needed. 02/04/20   Hoyt Koch, MD    Allergies    Benazepril, Lopressor [metoprolol], Adhesive [tape], Morphine, Nifedipine, and Piroxicam  Review of Systems   Review of Systems  Cardiovascular: Positive for chest pain.  All other systems reviewed and are negative.   Physical Exam Updated Vital Signs BP (!) 101/56   Pulse (!) 132   Temp 98.6 F (37 C) (Oral)   Resp 20   Ht _0  (1.6 m)   Wt 87.1 kg   LMP 12/30/2000   SpO2 98%   BMI 34.01 kg/m   Physical Exam Vitals and nursing note reviewed.  Constitutional:      Appearance: She is well-developed and well-nourished.  HENT:     Head: Normocephalic and atraumatic.     Nose: Nose normal. No congestion or rhinorrhea.     Mouth/Throat:     Mouth: Mucous membranes are moist.     Pharynx: Oropharynx is clear.  Eyes:     Pupils: Pupils are equal, round, and reactive to light.  Cardiovascular:     Rate and Rhythm: Tachycardia present. Rhythm irregular.  Pulmonary:     Effort: No respiratory distress.     Breath sounds: No stridor. No wheezing or rhonchi.  Abdominal:     General: Abdomen is flat. There is no distension.  Musculoskeletal:  General: No swelling or tenderness. Normal range of motion.     Cervical back: Normal range of motion.  Skin:    General: Skin is warm and dry.  Neurological:     General: No focal deficit present.     Mental Status: She is alert.     ED Results / Procedures / Treatments   Labs (all labs ordered are listed, but only abnormal results are displayed) Labs Reviewed  BASIC METABOLIC PANEL - Abnormal; Notable for the following components:      Result Value   CO2 21 (*)    Glucose, Bld 237 (*)    BUN 34 (*)    Creatinine, Ser 1.30 (*)    GFR, Estimated 42 (*)    All other components within normal limits  TROPONIN I (HIGH SENSITIVITY) -  Abnormal; Notable for the following components:   Troponin I (High Sensitivity) 34 (*)    All other components within normal limits  SARS CORONAVIRUS 2 (TAT 6-24 HRS)  CBC  TROPONIN I (HIGH SENSITIVITY)    EKG EKG Interpretation  Date/Time:  Saturday April 19 2020 05:00:40 EST Ventricular Rate:  159 PR Interval:    QRS Duration: 74 QT Interval:  248 QTC Calculation: 403 R Axis:   -41 Text Interpretation: Atrial fibrillation with rapid ventricular response Left axis deviation Marked ST abnormality, possible inferior subendocardial injury Abnormal ECG now in afib Confirmed by Merrily Pew [phone]) on 04/19/2020 7:01:26 AM   Radiology DG Chest Portable 1 View  Result Date: 04/19/2020 CLINICAL DATA:  80 year old female with chest pain, atrial fibrillation, RVR. EXAM: PORTABLE CHEST 1 VIEW COMPARISON:  Portable chest 05/22/2015 and earlier. FINDINGS: Portable AP upright view at 0516 hours. Stable mild tortuosity of the descending thoracic aorta. Other mediastinal contours are within normal limits. Visualized tracheal air column is within normal limits. Allowing for portable technique the lungs are clear. No pneumothorax. Negative visible bowel gas and osseous structures. IMPRESSION: No acute cardiopulmonary abnormality. Electronically Signed   By: Genevie Ann M.D.   On: 04/19/2020 05:59    Procedures .Critical Care Performed by: Merrily Pew, MD Authorized by: Merrily Pew, MD   Critical care provider statement:    Critical care time (minutes):  45   Critical care was necessary to treat or prevent imminent or life-threatening deterioration of the following conditions:  Cardiac failure   Critical care was time spent personally by me on the following activities:  Discussions with consultants, evaluation of patient's response to treatment, examination of patient, ordering and performing treatments and interventions, ordering and review of laboratory studies, ordering and review of  radiographic studies, pulse oximetry, re-evaluation of patient's condition, obtaining history from patient or surrogate and review of old charts     Medications Ordered in ED Medications  diltiazem (CARDIZEM) 1 mg/mL load via infusion 20 mg (20 mg Intravenous Bolus from Bag 04/19/20 0704)    And  diltiazem (CARDIZEM) 125 mg in dextrose 5% 125 mL (1 mg/mL) infusion (5 mg/hr Intravenous New Bag/Given 04/19/20 0704)  diltiazem (CARDIZEM) injection 15 mg (15 mg Intravenous Given 04/19/20 0540)  fentaNYL (SUBLIMAZE) injection 100 mcg (100 mcg Intravenous Given 04/19/20 0628)  lactated ringers bolus 1,000 mL (1,000 mLs Intravenous New Bag/Given 04/19/20 0701)    ED Course  I have reviewed the triage vital signs and the nursing notes.  Pertinent labs & imaging results that were available during my care of the patient were reviewed by me and considered in my medical decision making (see chart  for details).    MDM Rules/Calculators/A&P                          afib rvr. Offered cardioversion, had bad experience previously, prefers medication. Didn't improve with bolus of dilt. Upped the dose and added infusion which made rate much better, will admit for same.   Final Clinical Impression(s) / ED Diagnoses Final diagnoses:  Atrial fibrillation with RVR [facility])    Rx / DC Orders ED Discharge Orders    None       Anne-Marie Genson, Corene Cornea, MD 04/19/20 [phone]

## 2020-04-19 NOTE — Progress Notes (Signed)
[name] for [name] Indication: atrial fibrillation  Allergies  Allergen Reactions  . Benazepril Other (See Comments)    coughing  . Lopressor [Metoprolol] Other (See Comments)    nervousness  . Adhesive [Tape] Other (See Comments)    redness  . Morphine Other (See Comments)       Makes you feel bad all over  . Nifedipine Hives and Itching  . Piroxicam Hives and Itching    Patient Measurements: Height: 5\' 3"  (160 cm) Weight: 90 kg (198 lb 8 oz) IBW/kg (Calculated) : 52.4 Heparin Dosing Weight: 72 kg  Vital Signs: Temp: 98 F (36.7 C) (02/26 1701) Temp Source: Oral (02/26 1701) BP: 122/66 (02/26 1701) Pulse Rate: 68 (02/26 1701)  Labs: Recent Labs    04/19/20 0509 04/19/20 0721 04/19/20 1140 04/19/20 1508 04/19/20 1829 04/19/20 2135  HGB 14.5  --   --   --   --   --   HCT 44.3  --   --   --   --   --   PLT 253  --   --   --   --   --   HEPARINUNFRC  --   --   --   --   --  0.66  CREATININE 1.30*  --   --   --   --   --   TROPONINIHS 11   < > 487* 972* 1,043*  --    < > = values in this interval not displayed.    Estimated Creatinine Clearance: 37.9 mL/min (A) (by C-G formula based on SCr of 1.3 mg/dL (H)).   Medical History: Past Medical History:  Diagnosis Date  . Allergic rhinitis   . Allergy   . Anxiety    and panic attacks;pt states that she is claustrophobic  . Asthma   . Atrial fibrillation ([place])    Used to take Coumadin-instructed to stop  . Cataract    bilateral surgery to remove  . Chronic back pain    hx buldging disc  . Colon cancer ([place]) 2007   s/p surgery and chemo  . Constipation    takes Colace prn  . Diabetes mellitus    glimepiride  . DJD (degenerative joint disease)   . Fibromyalgia    but doesn't take any meds  . Gastric ulcer   . GERD (gastroesophageal reflux disease)    uses Tums prn  . Gout    takes Colchicine prn  . History of blood clots    right lung in early 90's  .  History of colon polyps   . History of migraine headaches    last migraine 46yrs ago;Pt states she does get sinus headaches  . History of PSVT (paroxysmal supraventricular tachycardia) no current problems  . History of shingles   . HLD (hyperlipidemia)    diet control - no meds  . Hypertension    takes Micardis and Diltiazem daily  . Osteoarthritis   . Peptic ulcer disease   . Peripheral edema    takes Furosemide every other day  . Sleep apnea    doesn't use CPAP    Medications:  Scheduled:  . aspirin EC  81 mg Oral Daily  . atorvastatin  20 mg Oral Daily  . colchicine  0.6 mg Oral BID  . docusate sodium  100 mg Oral BID  . famotidine  20 mg Oral BID  . insulin aspart  0-15 Units Subcutaneous TID WC  . insulin aspart  0-5 Units Subcutaneous QHS  . sodium chloride flush  3 mL Intravenous Q12H    Assessment: Patient is a 26 yof that has a long standing hx of afib and is not currently anticoagulated due to pt preference. Pharmacy has been asked to dose heparin at this time while patient considers her options of oral anticoagulants at this time.  2/26 PM update:  Initial heparin level is therapeutic     Goal of Therapy:  Heparin level 0.3-0.7 units/ml Monitor platelets by anticoagulation protocol: Yes   Plan:  - Cont heparin at 1050 units/hr - Confirmatory heparin level with AM labs - Monitor patient for s/s of bleeding and CBC while on heparin   Narda Bonds, PharmD, [place] Pharmacist Phone: [phone]

## 2020-04-19 NOTE — ED Triage Notes (Signed)
Patient brought in by EMS, patient found in Afib RVR 120-220.  She was having some dizziness and weakness.  Patient did have some chest pain and tightness.  No nausea or vomiting.

## 2020-04-19 NOTE — Progress Notes (Signed)
  Echocardiogram 2D Echocardiogram has been performed.  [name] 04/19/2020, 3:27 PM

## 2020-04-20 DIAGNOSIS — E6609 Other obesity due to excess calories: Secondary | ICD-10-CM | POA: Diagnosis not present

## 2020-04-20 DIAGNOSIS — Z96652 Presence of left artificial knee joint: Secondary | ICD-10-CM | POA: Diagnosis present

## 2020-04-20 DIAGNOSIS — E785 Hyperlipidemia, unspecified: Secondary | ICD-10-CM

## 2020-04-20 DIAGNOSIS — Z85038 Personal history of other malignant neoplasm of large intestine: Secondary | ICD-10-CM | POA: Diagnosis not present

## 2020-04-20 DIAGNOSIS — I248 Other forms of acute ischemic heart disease: Secondary | ICD-10-CM | POA: Diagnosis present

## 2020-04-20 DIAGNOSIS — N1832 Chronic kidney disease, stage 3b: Secondary | ICD-10-CM | POA: Diagnosis not present

## 2020-04-20 DIAGNOSIS — I10 Essential (primary) hypertension: Secondary | ICD-10-CM | POA: Diagnosis not present

## 2020-04-20 DIAGNOSIS — K219 Gastro-esophageal reflux disease without esophagitis: Secondary | ICD-10-CM | POA: Diagnosis present

## 2020-04-20 DIAGNOSIS — Z9851 Tubal ligation status: Secondary | ICD-10-CM | POA: Diagnosis not present

## 2020-04-20 DIAGNOSIS — G4733 Obstructive sleep apnea (adult) (pediatric): Secondary | ICD-10-CM | POA: Diagnosis present

## 2020-04-20 DIAGNOSIS — E1122 Type 2 diabetes mellitus with diabetic chronic kidney disease: Secondary | ICD-10-CM | POA: Diagnosis present

## 2020-04-20 DIAGNOSIS — M5441 Lumbago with sciatica, right side: Secondary | ICD-10-CM

## 2020-04-20 DIAGNOSIS — Z9221 Personal history of antineoplastic chemotherapy: Secondary | ICD-10-CM | POA: Diagnosis not present

## 2020-04-20 DIAGNOSIS — M549 Dorsalgia, unspecified: Secondary | ICD-10-CM | POA: Diagnosis present

## 2020-04-20 DIAGNOSIS — G8929 Other chronic pain: Secondary | ICD-10-CM

## 2020-04-20 DIAGNOSIS — E1136 Type 2 diabetes mellitus with diabetic cataract: Secondary | ICD-10-CM | POA: Diagnosis present

## 2020-04-20 DIAGNOSIS — Z9049 Acquired absence of other specified parts of digestive tract: Secondary | ICD-10-CM | POA: Diagnosis not present

## 2020-04-20 DIAGNOSIS — E1169 Type 2 diabetes mellitus with other specified complication: Secondary | ICD-10-CM

## 2020-04-20 DIAGNOSIS — I480 Paroxysmal atrial fibrillation: Secondary | ICD-10-CM | POA: Diagnosis present

## 2020-04-20 DIAGNOSIS — Z6834 Body mass index (BMI) 34.0-34.9, adult: Secondary | ICD-10-CM

## 2020-04-20 DIAGNOSIS — I4891 Unspecified atrial fibrillation: Secondary | ICD-10-CM | POA: Diagnosis present

## 2020-04-20 DIAGNOSIS — Z981 Arthrodesis status: Secondary | ICD-10-CM | POA: Diagnosis not present

## 2020-04-20 DIAGNOSIS — Z20822 Contact with and (suspected) exposure to covid-19: Secondary | ICD-10-CM | POA: Diagnosis present

## 2020-04-20 DIAGNOSIS — R519 Headache, unspecified: Secondary | ICD-10-CM | POA: Diagnosis present

## 2020-04-20 DIAGNOSIS — J449 Chronic obstructive pulmonary disease, unspecified: Secondary | ICD-10-CM | POA: Diagnosis present

## 2020-04-20 DIAGNOSIS — Z800 Family history of malignant neoplasm of digestive organs: Secondary | ICD-10-CM | POA: Diagnosis not present

## 2020-04-20 DIAGNOSIS — I214 Non-ST elevation (NSTEMI) myocardial infarction: Secondary | ICD-10-CM | POA: Diagnosis not present

## 2020-04-20 DIAGNOSIS — I129 Hypertensive chronic kidney disease with stage 1 through stage 4 chronic kidney disease, or unspecified chronic kidney disease: Secondary | ICD-10-CM | POA: Diagnosis present

## 2020-04-20 LAB — BASIC METABOLIC PANEL
Anion gap: 12 (ref 5–15)
BUN: 34 mg/dL — ABNORMAL HIGH (ref 8–23)
CO2: 23 mmol/L (ref 22–32)
Calcium: 9.1 mg/dL (ref 8.9–10.3)
Chloride: 105 mmol/L (ref 98–111)
Creatinine, Ser: 1.32 mg/dL — ABNORMAL HIGH (ref 0.44–1.00)
GFR, Estimated: 41 mL/min — ABNORMAL LOW (ref >60)
Glucose, Bld: 155 mg/dL — ABNORMAL HIGH (ref 70–99)
Potassium: 3.6 mmol/L (ref 3.5–5.1)
Sodium: 140 mmol/L (ref 135–145)

## 2020-04-20 LAB — CBC
HCT: 37.3 % (ref 36.0–46.0)
Hemoglobin: 12.4 g/dL (ref 12.0–15.0)
MCH: 29.5 pg (ref 26.0–34.0)
MCHC: 33.2 g/dL (ref 30.0–36.0)
MCV: 88.6 fL (ref 80.0–100.0)
Platelets: 198 10*3/uL (ref 150–400)
RBC: 4.21 MIL/uL (ref 3.87–5.11)
RDW: 14.5 % (ref 11.5–15.5)
WBC: 9.3 10*3/uL (ref 4.0–10.5)
nRBC: 0 % (ref 0.0–0.2)

## 2020-04-20 LAB — GLUCOSE, CAPILLARY
Glucose-Capillary: 139 mg/dL — ABNORMAL HIGH (ref 70–99)
Glucose-Capillary: 162 mg/dL — ABNORMAL HIGH (ref 70–99)
Glucose-Capillary: 117 mg/dL — ABNORMAL HIGH (ref 70–99)
Glucose-Capillary: 177 mg/dL — ABNORMAL HIGH (ref 70–99)
Glucose-Capillary: 129 mg/dL — ABNORMAL HIGH (ref 70–99)
Glucose-Capillary: 122 mg/dL — ABNORMAL HIGH (ref 70–99)

## 2020-04-20 LAB — HEPARIN LEVEL (UNFRACTIONATED): Heparin Unfractionated: 0.62 IU/mL (ref 0.30–0.70)

## 2020-04-20 MED ORDER — SODIUM CHLORIDE 0.9 % WEIGHT BASED INFUSION
1.0000 mL/kg/h | INTRAVENOUS | Status: DC
  Administered 2020-04-20: 1 mL/kg/h via INTRAVENOUS

## 2020-04-20 MED ORDER — PATIENT'S GUIDE TO USING COUMADIN BOOK
Freq: Once | Status: DC
  Filled 2020-04-20: qty 1

## 2020-04-20 MED ORDER — SODIUM CHLORIDE 0.9 % IV SOLN: 250.0000 mL | INTRAVENOUS | Status: DC | PRN

## 2020-04-20 MED ORDER — SODIUM CHLORIDE 0.9% FLUSH: 3.0000 mL | INTRAVENOUS | Status: DC | PRN

## 2020-04-20 MED ORDER — ASPIRIN 81 MG PO CHEW
81.0000 mg | CHEWABLE_TABLET | ORAL | Status: AC
  Administered 2020-04-21: 81 mg via ORAL
  Filled 2020-04-20: qty 1

## 2020-04-20 MED ORDER — METOPROLOL TARTRATE 25 MG PO TABS
25.0000 mg | ORAL_TABLET | Freq: Two times a day (BID) | ORAL | Status: DC
  Administered 2020-04-20 (×2): 25 mg via ORAL
  Filled 2020-04-20 (×3): qty 1

## 2020-04-20 MED ORDER — COUMADIN BOOK
Freq: Once | Status: DC
  Filled 2020-04-20: qty 1

## 2020-04-20 MED ORDER — OFF THE BEAT BOOK
Freq: Once | Status: DC
  Filled 2020-04-20: qty 1

## 2020-04-20 NOTE — Progress Notes (Signed)
[facility] for [name] Indication: atrial fibrillation  Allergies  Allergen Reactions  . Benazepril Other (See Comments)    coughing  . Lopressor [Metoprolol] Other (See Comments)    nervousness  . Adhesive [Tape] Other (See Comments)    redness  . Morphine Other (See Comments)       Makes you feel bad all over  . Nifedipine Hives and Itching  . Piroxicam Hives and Itching    Patient Measurements: Height: 5\' 3"  (160 cm) Weight: 89.1 kg (196 lb 6.9 oz) IBW/kg (Calculated) : 52.4 Heparin Dosing Weight: 72 kg  Vital Signs: Temp: 98.4 F (36.9 C) (02/27 0418) Temp Source: Oral (02/27 0418) BP: 152/77 (02/27 0417) Pulse Rate: 56 (02/27 0417)  Labs: Recent Labs    04/19/20 0509 04/19/20 0721 04/19/20 1508 04/19/20 1829 04/19/20 2135 04/20/20 0258  HGB 14.5  --   --   --   --  12.4  HCT 44.3  --   --   --   --  37.3  PLT 253  --   --   --   --  198  HEPARINUNFRC  --   --   --   --  0.66 0.62  CREATININE 1.30*  --   --   --   --  1.32*  TROPONINIHS 11   < > 972* 1,043* 1,102*  --    < > = values in this interval not displayed.    Estimated Creatinine Clearance: 37.2 mL/min (A) (by C-G formula based on SCr of 1.32 mg/dL (H)).   Medical History: Past Medical History:  Diagnosis Date  . Allergic rhinitis   . Allergy   . Anxiety    and panic attacks;pt states that she is claustrophobic  . Asthma   . Atrial fibrillation ([place])    Used to take Coumadin-instructed to stop  . Cataract    bilateral surgery to remove  . Chronic back pain    hx buldging disc  . Colon cancer ([place]) 2007   s/p surgery and chemo  . Constipation    takes Colace prn  . Diabetes mellitus    glimepiride  . DJD (degenerative joint disease)   . Fibromyalgia    but doesn't take any meds  . Gastric ulcer   . GERD (gastroesophageal reflux disease)    uses Tums prn  . Gout    takes Colchicine prn  . History of blood clots    right lung in early 90's   . History of colon polyps   . History of migraine headaches    last migraine 51yrs ago;Pt states she does get sinus headaches  . History of PSVT (paroxysmal supraventricular tachycardia) no current problems  . History of shingles   . HLD (hyperlipidemia)    diet control - no meds  . Hypertension    takes Micardis and Diltiazem daily  . Osteoarthritis   . Peptic ulcer disease   . Peripheral edema    takes Furosemide every other day  . Sleep apnea    doesn't use CPAP    Medications:  Scheduled:  . aspirin EC  81 mg Oral Daily  . atorvastatin  20 mg Oral Daily  . colchicine  0.6 mg Oral BID  . docusate sodium  100 mg Oral BID  . famotidine  20 mg Oral BID  . insulin aspart  0-15 Units Subcutaneous TID WC  . insulin aspart  0-5 Units Subcutaneous QHS  . off the beat  book   Does not apply Once  . sodium chloride flush  3 mL Intravenous Q12H    Assessment: Patient is a 18 yof that has a long standing hx of afib and is not currently anticoagulated due to pt preference. Pharmacy has been asked to dose heparin at this time while patient considers her options of oral anticoagulants at this time.  Heparin level this morning remains therapeutic at 0.62. H/H and platelets wnl. No bleeding noted.    Goal of Therapy:  Heparin level 0.3-0.7 units/ml Monitor platelets by anticoagulation protocol: Yes   Plan:  Cont heparin at 1050 units/hr Daily heparin level and CBC Monitor patient for s/s of bleeding  F/u oral anticoagulant plans   Rebbeca Paul, PharmD PGY1 Pharmacy Resident 04/20/2020 7:36 AM  Please check [email or web address] for unit-specific pharmacy phone numbers.

## 2020-04-20 NOTE — Progress Notes (Signed)
Progress Note  Patient Name: [name] Date of Encounter: 04/20/2020  [facility] Cardiologist: Dorris Carnes, MD   Subjective   No CP or dyspnea  Inpatient Medications    Scheduled Meds: . aspirin EC  81 mg Oral Daily  . atorvastatin  20 mg Oral Daily  . colchicine  0.6 mg Oral BID  . docusate sodium  100 mg Oral BID  . famotidine  20 mg Oral BID  . insulin aspart  0-15 Units Subcutaneous TID WC  . insulin aspart  0-5 Units Subcutaneous QHS  . off the beat book   Does not apply Once  . sodium chloride flush  3 mL Intravenous Q12H   Continuous Infusions: . diltiazem (CARDIZEM) infusion Stopped (04/19/20 1746)  . heparin 1,050 Units/hr (04/19/20 1844)   PRN Meds: acetaminophen, albuterol, baclofen, nitroGLYCERIN, ondansetron (ZOFRAN) IV, traMADol   Vital Signs    Vitals:   04/19/20 2339 04/20/20 0417 04/20/20 0418 04/20/20 0613  BP:  (!) 152/77    Pulse:  (!) 56    Resp: 18     Temp:  98.4 F (36.9 C) 98.4 F (36.9 C)   TempSrc:  Oral Oral   SpO2:  97%    Weight:    89.1 kg  Height:        Intake/Output Summary (Last 24 hours) at 04/20/2020 0757 Last data filed at 04/19/2020 2359 Gross per 24 hour  Intake 904.86 ml  Output -  Net 904.86 ml   Last 3 Weights 04/20/2020 04/19/2020 04/19/2020  Weight (lbs) 196 lb 6.9 oz 198 lb 8 oz 192 lb  Weight (kg) 89.1 kg 90.039 kg 87.091 kg      Telemetry    Atrial fibrillation converting to sinus rhythm- Personally Reviewed  Physical Exam   GEN: No acute distress.   Neck: No JVD Cardiac: RRR, 2/6 systolic murmur Respiratory: Clear to auscultation bilaterally. GI: Soft, nontender, non-distended  MS: No edema Neuro:  Nonfocal  Psych: Normal affect   Labs    High Sensitivity Troponin:   Recent Labs  Lab 04/19/20 0928 04/19/20 1140 04/19/20 1508 04/19/20 1829 04/19/20 2135  TROPONINIHS 165* 487* 972* 1,043* 1,102*      Chemistry Recent Labs  Lab 04/19/20 0509 04/20/20 0258  NA 137 140  K 3.8  3.6  CL 104 105  CO2 21* 23  GLUCOSE 237* 155*  BUN 34* 34*  CREATININE 1.30* 1.32*  CALCIUM 10.0 9.1  GFRNONAA 42* 41*  ANIONGAP 12 12     Hematology Recent Labs  Lab 04/19/20 0509 04/20/20 0258  WBC 10.4 9.3  RBC 4.97 4.21  HGB 14.5 12.4  HCT 44.3 37.3  MCV 89.1 88.6  MCH 29.2 29.5  MCHC 32.7 33.2  RDW 14.1 14.5  PLT 253 198    Radiology    DG Chest Portable 1 View  Result Date: 04/19/2020 CLINICAL DATA:  80 year old female with chest pain, atrial fibrillation, RVR. EXAM: PORTABLE CHEST 1 VIEW COMPARISON:  Portable chest 05/22/2015 and earlier. FINDINGS: Portable AP upright view at 0516 hours. Stable mild tortuosity of the descending thoracic aorta. Other mediastinal contours are within normal limits. Visualized tracheal air column is within normal limits. Allowing for portable technique the lungs are clear. No pneumothorax. Negative visible bowel gas and osseous structures. IMPRESSION: No acute cardiopulmonary abnormality. Electronically Signed   By: Genevie Ann M.D.   On: 04/19/2020 05:59   ECHOCARDIOGRAM COMPLETE  Result Date: 04/19/2020    ECHOCARDIOGRAM REPORT   Patient  Name:   [name] Date of Exam: 04/19/2020 Medical Rec #:  [id number]      Height:       63.0 in Accession #:    [id number]     Weight:       198.5 lb Date of Birth:  04/29/1940      BSA:          1.927 m Patient Age:    37 years       BP:           142/100 mmHg Patient Gender: F              HR:           93 bpm. Exam Location:  Inpatient Procedure: 2D Echo, Cardiac Doppler and Color Doppler Indications:    Atrial Fibrillation I48.91  History:        Patient has prior history of Echocardiogram examinations, most                 recent 05/23/2015. Arrythmias:Atrial Fibrillation; Risk                 Factors:Hypertension, Diabetes, Dyslipidemia, Sleep Apnea and                 Former Smoker.  Sonographer:    Vickie Epley RDCS Referring Phys: [place]  1. Possible fusion of noncoronary and  left coronary cusps but no significant AS by doppler.  2. Left ventricular ejection fraction, by estimation, is 60 to 65%. The left ventricle has normal function. The left ventricle has no regional wall motion abnormalities. There is mild left ventricular hypertrophy. Left ventricular diastolic function could not be evaluated.  3. Right ventricular systolic function is normal. The right ventricular size is normal. Tricuspid regurgitation signal is inadequate for assessing PA pressure.  4. The mitral valve is normal in structure. Trivial mitral valve regurgitation. No evidence of mitral stenosis.  5. The aortic valve is abnormal. Aortic valve regurgitation is trivial. Mild to moderate aortic valve sclerosis/calcification is present, without any evidence of aortic stenosis.  6. The inferior vena cava is normal in size with greater than 50% respiratory variability, suggesting right atrial pressure of 3 mmHg. FINDINGS  Left Ventricle: Left ventricular ejection fraction, by estimation, is 60 to 65%. The left ventricle has normal function. The left ventricle has no regional wall motion abnormalities. The left ventricular internal cavity size was normal in size. There is  mild left ventricular hypertrophy. Left ventricular diastolic function could not be evaluated due to atrial fibrillation. Left ventricular diastolic function could not be evaluated. Right Ventricle: The right ventricular size is normal. Right ventricular systolic function is normal. Tricuspid regurgitation signal is inadequate for assessing PA pressure. The tricuspid regurgitant velocity is 2.51 m/s, and with an assumed right atrial  pressure of 3 mmHg, the estimated right ventricular systolic pressure is 82.9 mmHg. Left Atrium: Left atrial size was normal in size. Right Atrium: Right atrial size was normal in size. Pericardium: There is no evidence of pericardial effusion. Mitral Valve: The mitral valve is normal in structure. Mild mitral annular  calcification. Trivial mitral valve regurgitation. No evidence of mitral valve stenosis. Tricuspid Valve: The tricuspid valve is normal in structure. Tricuspid valve regurgitation is mild . No evidence of tricuspid stenosis. Aortic Valve: The aortic valve is abnormal. Aortic valve regurgitation is trivial. Mild to moderate aortic valve sclerosis/calcification is present, without any evidence of aortic stenosis. Pulmonic Valve: The pulmonic  valve was not well visualized. Pulmonic valve regurgitation is trivial. No evidence of pulmonic stenosis. Aorta: The aortic root is normal in size and structure. Venous: The inferior vena cava is normal in size with greater than 50% respiratory variability, suggesting right atrial pressure of 3 mmHg.  Additional Comments: Possible fusion of noncoronary and left coronary cusps but no significant AS by doppler.  LEFT VENTRICLE PLAX 2D LVIDd:         4.20 cm LVIDs:         3.10 cm LV PW:         0.90 cm LV IVS:        0.90 cm LVOT diam:     2.00 cm LV SV:         60 LV SV Index:   31 LVOT Area:     3.14 cm  LV Volumes (MOD) LV vol d, MOD A2C: 85.5 ml LV vol d, MOD A4C: 75.5 ml LV vol s, MOD A2C: 33.4 ml LV vol s, MOD A4C: 34.6 ml LV SV MOD A2C:     52.1 ml LV SV MOD A4C:     75.5 ml LV SV MOD BP:      49.3 ml RIGHT VENTRICLE TAPSE (M-mode): 1.6 cm LEFT ATRIUM             Index       RIGHT ATRIUM           Index LA diam:        4.50 cm 2.33 cm/m  RA Area:     18.20 cm LA Vol (A2C):   41.6 ml 21.58 ml/m RA Volume:   50.50 ml  26.20 ml/m LA Vol (A4C):   43.6 ml 22.62 ml/m LA Biplane Vol: 43.9 ml 22.78 ml/m  AORTIC VALVE LVOT Vmax:   91.85 cm/s LVOT Vmean:  68.150 cm/s LVOT VTI:    0.192 m  AORTA Ao Root diam: 3.50 cm TRICUSPID VALVE TR Peak grad:   25.2 mmHg TR Vmax:        251.00 cm/s  SHUNTS Systemic VTI:  0.19 m Systemic Diam: 2.00 cm Kirk Ruths MD Electronically signed by Kirk Ruths MD Signature Date/Time: 04/19/2020/3:29:05 PM    Final     Patient Profile     80  y.o. female with past medical history of paroxysmal atrial fibrillation, hypertension, hyperlipidemia, diabetes mellitus, obstructive sleep apnea, history of mild aortic stenosis, remote pulmonary embolus, previous resection of colon cancer admitted with chest pain and recurrent atrial fibrillation.  Echocardiogram shows normal LV function, mild left ventricular hypertrophy, aortic sclerosis without aortic stenosis, trace aortic insufficiency.  Assessment & Plan    1 paroxysmal atrial fibrillation-patient has converted to sinus rhythm. We will discontinue IV Cardizem. Given non-ST elevation myocardial infarction I would prefer that she be on a beta-blocker. Metoprolol is listed as causing "nervousness". I will try 25 mg twice daily to see if she will tolerate. Note echocardiogram shows preserved LV function. Continue heparin. Will transition to apixaban once all procedures complete. Will review options for atrial fibrillation with electrophysiology tomorrow. I would favor Tikosyn.  2 non-ST elevation myocardial infarction-troponin has increased to 1102. While this certainly could be demand ischemia she has her risk factors including diabetes mellitus and strong family history of coronary disease. She also complained of significant chest pain with this episode of atrial fibrillation which is unlike previous. I feel cardiac catheterization is warranted. The risks and benefits including myocardial infarction, CVA and death discussed and  she agrees to proceed. Creatinine mildly increased. Will hydrate prior to the procedure and limit dye. Follow renal function closely afterwards. We will continue with aspirin, statin and heparin. Add metoprolol as outlined.  3 hypertension-discontinue Cardizem and treat with metoprolol. Losartan is on hold but will resume following cardiac catheterization.  4 history of mild aortic stenosis-not evident on most recent echocardiogram.  5 hyperlipidemia-continue statin.  For  questions or updates, please contact [place] Please consult [email or web address] for contact info under        Signed, Kirk Ruths, MD  04/20/2020, 7:57 AM

## 2020-04-20 NOTE — Progress Notes (Signed)
Patient watched cath video with her husband

## 2020-04-20 NOTE — H&P (View-Only) (Signed)
Progress Note  Patient Name: [name] Date of Encounter: 04/20/2020  [facility] Cardiologist: Dorris Carnes, MD   Subjective   No CP or dyspnea  Inpatient Medications    Scheduled Meds: . aspirin EC  81 mg Oral Daily  . atorvastatin  20 mg Oral Daily  . colchicine  0.6 mg Oral BID  . docusate sodium  100 mg Oral BID  . famotidine  20 mg Oral BID  . insulin aspart  0-15 Units Subcutaneous TID WC  . insulin aspart  0-5 Units Subcutaneous QHS  . off the beat book   Does not apply Once  . sodium chloride flush  3 mL Intravenous Q12H   Continuous Infusions: . diltiazem (CARDIZEM) infusion Stopped (04/19/20 1746)  . heparin 1,050 Units/hr (04/19/20 1844)   PRN Meds: acetaminophen, albuterol, baclofen, nitroGLYCERIN, ondansetron (ZOFRAN) IV, traMADol   Vital Signs    Vitals:   04/19/20 2339 04/20/20 0417 04/20/20 0418 04/20/20 0613  BP:  (!) 152/77    Pulse:  (!) 56    Resp: 18     Temp:  98.4 F (36.9 C) 98.4 F (36.9 C)   TempSrc:  Oral Oral   SpO2:  97%    Weight:    89.1 kg  Height:        Intake/Output Summary (Last 24 hours) at 04/20/2020 0757 Last data filed at 04/19/2020 2359 Gross per 24 hour  Intake 904.86 ml  Output --  Net 904.86 ml   Last 3 Weights 04/20/2020 04/19/2020 04/19/2020  Weight (lbs) 196 lb 6.9 oz 198 lb 8 oz 192 lb  Weight (kg) 89.1 kg 90.039 kg 87.091 kg      Telemetry    Atrial fibrillation converting to sinus rhythm- Personally Reviewed  Physical Exam   GEN: No acute distress.   Neck: No JVD Cardiac: RRR, 2/6 systolic murmur Respiratory: Clear to auscultation bilaterally. GI: Soft, nontender, non-distended  MS: No edema Neuro:  Nonfocal  Psych: Normal affect   Labs    High Sensitivity Troponin:   Recent Labs  Lab 04/19/20 0928 04/19/20 1140 04/19/20 1508 04/19/20 1829 04/19/20 2135  TROPONINIHS 165* 487* 972* 1,043* 1,102*      Chemistry Recent Labs  Lab 04/19/20 0509 04/20/20 0258  NA 137 140  K  3.8 3.6  CL 104 105  CO2 21* 23  GLUCOSE 237* 155*  BUN 34* 34*  CREATININE 1.30* 1.32*  CALCIUM 10.0 9.1  GFRNONAA 42* 41*  ANIONGAP 12 12     Hematology Recent Labs  Lab 04/19/20 0509 04/20/20 0258  WBC 10.4 9.3  RBC 4.97 4.21  HGB 14.5 12.4  HCT 44.3 37.3  MCV 89.1 88.6  MCH 29.2 29.5  MCHC 32.7 33.2  RDW 14.1 14.5  PLT 253 198    Radiology    DG Chest Portable 1 View  Result Date: 04/19/2020 CLINICAL DATA:  80 year old female with chest pain, atrial fibrillation, RVR. EXAM: PORTABLE CHEST 1 VIEW COMPARISON:  Portable chest 05/22/2015 and earlier. FINDINGS: Portable AP upright view at 0516 hours. Stable mild tortuosity of the descending thoracic aorta. Other mediastinal contours are within normal limits. Visualized tracheal air column is within normal limits. Allowing for portable technique the lungs are clear. No pneumothorax. Negative visible bowel gas and osseous structures. IMPRESSION: No acute cardiopulmonary abnormality. Electronically Signed   By: Genevie Ann M.D.   On: 04/19/2020 05:59   ECHOCARDIOGRAM COMPLETE  Result Date: 04/19/2020    ECHOCARDIOGRAM REPORT   Patient  Name:   [name] Date of Exam: 04/19/2020 Medical Rec #:  [id number]      Height:       63.0 in Accession #:    [id number]     Weight:       198.5 lb Date of Birth:  10/07/1940      BSA:          1.927 m Patient Age:    80 years       BP:           142/100 mmHg Patient Gender: F              HR:           93 bpm. Exam Location:  Inpatient Procedure: 2D Echo, Cardiac Doppler and Color Doppler Indications:    Atrial Fibrillation I48.91  History:        Patient has prior history of Echocardiogram examinations, most                 recent 05/23/2015. Arrythmias:Atrial Fibrillation; Risk                 Factors:Hypertension, Diabetes, Dyslipidemia, Sleep Apnea and                 Former Smoker.  Sonographer:    Vickie Epley RDCS Referring Phys: [place]  1. Possible fusion of noncoronary  and left coronary cusps but no significant AS by doppler.  2. Left ventricular ejection fraction, by estimation, is 60 to 65%. The left ventricle has normal function. The left ventricle has no regional wall motion abnormalities. There is mild left ventricular hypertrophy. Left ventricular diastolic function could not be evaluated.  3. Right ventricular systolic function is normal. The right ventricular size is normal. Tricuspid regurgitation signal is inadequate for assessing PA pressure.  4. The mitral valve is normal in structure. Trivial mitral valve regurgitation. No evidence of mitral stenosis.  5. The aortic valve is abnormal. Aortic valve regurgitation is trivial. Mild to moderate aortic valve sclerosis/calcification is present, without any evidence of aortic stenosis.  6. The inferior vena cava is normal in size with greater than 50% respiratory variability, suggesting right atrial pressure of 3 mmHg. FINDINGS  Left Ventricle: Left ventricular ejection fraction, by estimation, is 60 to 65%. The left ventricle has normal function. The left ventricle has no regional wall motion abnormalities. The left ventricular internal cavity size was normal in size. There is  mild left ventricular hypertrophy. Left ventricular diastolic function could not be evaluated due to atrial fibrillation. Left ventricular diastolic function could not be evaluated. Right Ventricle: The right ventricular size is normal. Right ventricular systolic function is normal. Tricuspid regurgitation signal is inadequate for assessing PA pressure. The tricuspid regurgitant velocity is 2.51 m/s, and with an assumed right atrial  pressure of 3 mmHg, the estimated right ventricular systolic pressure is 66.5 mmHg. Left Atrium: Left atrial size was normal in size. Right Atrium: Right atrial size was normal in size. Pericardium: There is no evidence of pericardial effusion. Mitral Valve: The mitral valve is normal in structure. Mild mitral annular  calcification. Trivial mitral valve regurgitation. No evidence of mitral valve stenosis. Tricuspid Valve: The tricuspid valve is normal in structure. Tricuspid valve regurgitation is mild . No evidence of tricuspid stenosis. Aortic Valve: The aortic valve is abnormal. Aortic valve regurgitation is trivial. Mild to moderate aortic valve sclerosis/calcification is present, without any evidence of aortic stenosis. Pulmonic Valve: The pulmonic  valve was not well visualized. Pulmonic valve regurgitation is trivial. No evidence of pulmonic stenosis. Aorta: The aortic root is normal in size and structure. Venous: The inferior vena cava is normal in size with greater than 50% respiratory variability, suggesting right atrial pressure of 3 mmHg.  Additional Comments: Possible fusion of noncoronary and left coronary cusps but no significant AS by doppler.  LEFT VENTRICLE PLAX 2D LVIDd:         4.20 cm LVIDs:         3.10 cm LV PW:         0.90 cm LV IVS:        0.90 cm LVOT diam:     2.00 cm LV SV:         60 LV SV Index:   31 LVOT Area:     3.14 cm  LV Volumes (MOD) LV vol d, MOD A2C: 85.5 ml LV vol d, MOD A4C: 75.5 ml LV vol s, MOD A2C: 33.4 ml LV vol s, MOD A4C: 34.6 ml LV SV MOD A2C:     52.1 ml LV SV MOD A4C:     75.5 ml LV SV MOD BP:      49.3 ml RIGHT VENTRICLE TAPSE (M-mode): 1.6 cm LEFT ATRIUM             Index       RIGHT ATRIUM           Index LA diam:        4.50 cm 2.33 cm/m  RA Area:     18.20 cm LA Vol (A2C):   41.6 ml 21.58 ml/m RA Volume:   50.50 ml  26.20 ml/m LA Vol (A4C):   43.6 ml 22.62 ml/m LA Biplane Vol: 43.9 ml 22.78 ml/m  AORTIC VALVE LVOT Vmax:   91.85 cm/s LVOT Vmean:  68.150 cm/s LVOT VTI:    0.192 m  AORTA Ao Root diam: 3.50 cm TRICUSPID VALVE TR Peak grad:   25.2 mmHg TR Vmax:        251.00 cm/s  SHUNTS Systemic VTI:  0.19 m Systemic Diam: 2.00 cm Kirk Ruths MD Electronically signed by Kirk Ruths MD Signature Date/Time: 04/19/2020/3:29:05 PM    Final     Patient Profile     80  y.o. female with past medical history of paroxysmal atrial fibrillation, hypertension, hyperlipidemia, diabetes mellitus, obstructive sleep apnea, history of mild aortic stenosis, remote pulmonary embolus, previous resection of colon cancer admitted with chest pain and recurrent atrial fibrillation.  Echocardiogram shows normal LV function, mild left ventricular hypertrophy, aortic sclerosis without aortic stenosis, trace aortic insufficiency.  Assessment & Plan    1 paroxysmal atrial fibrillation-patient has converted to sinus rhythm. We will discontinue IV Cardizem. Given non-ST elevation myocardial infarction I would prefer that she be on a beta-blocker. Metoprolol is listed as causing "nervousness". I will try 25 mg twice daily to see if she will tolerate. Note echocardiogram shows preserved LV function. Continue heparin. Will transition to apixaban once all procedures complete. Will review options for atrial fibrillation with electrophysiology tomorrow. I would favor Tikosyn.  2 non-ST elevation myocardial infarction-troponin has increased to 1102. While this certainly could be demand ischemia she has her risk factors including diabetes mellitus and strong family history of coronary disease. She also complained of significant chest pain with this episode of atrial fibrillation which is unlike previous. I feel cardiac catheterization is warranted. The risks and benefits including myocardial infarction, CVA and death discussed and  she agrees to proceed. Creatinine mildly increased. Will hydrate prior to the procedure and limit dye. Follow renal function closely afterwards. We will continue with aspirin, statin and heparin. Add metoprolol as outlined.  3 hypertension-discontinue Cardizem and treat with metoprolol. Losartan is on hold but will resume following cardiac catheterization.  4 history of mild aortic stenosis-not evident on most recent echocardiogram.  5 hyperlipidemia-continue statin.  For  questions or updates, please contact [place] Please consult [email or web address] for contact info under        Signed, Kirk Ruths, MD  04/20/2020, 7:57 AM

## 2020-04-20 NOTE — Progress Notes (Addendum)
PROGRESS NOTE  [name] IRJ:[id number] DOB: 08-06-1940 DOA: 04/19/2020 PCP: Hoyt Koch, MD   LOS: 0 days   Brief narrative: [name] is a 80 y.o. female with medical history significant of OSA not on CPAP; HTN; HLD; DM; colon cancer (2007); chronic back pain; and atrial fibrillation presented to the hospital with dizziness weakness headache and jaw pain.  She also had chest pain with some radiation to the arm and back. She knew she was in afib but was also concerned maybe she was having a heart attack.    In the ED, patient was noted to have atrial fibrillation with RVR and was initially started on Cardizem drip.  Patient had refused to anticoagulation initially but at this time she is on anticoagulation.  Cardiology has seen the patient and is recommending cardiac catheterization at some point.  Assessment/Plan:  Principal Problem:   Atrial fibrillation with RVR (HCC) Active Problems:   Type 2 diabetes mellitus with hyperlipidemia (HCC)   Essential hypertension   SLEEP APNEA, OBSTRUCTIVE   Chronic back pain   Class 1 obesity due to excess calories with body mass index (BMI) of 34.0 to 34.9 in adult   Stage 3b chronic kidney disease (HCC)   A-fib (HCC)  Atrial fibrillation with RVR. Patient presented to the hospital with recurrent atrial fibrillation now with RVR.  Initially was on Cardizem drip.  Cardiology has been consulted at this time.  Patient does have mildly elevated troponins as well.  Was on aspirin as outpatient.  Initially was reluctant on anticoagulation but currently on heparin drip.  TSH of 0.7.  Check 2D echocardiogram with left ventricle ejection fraction of 60 to 65%. CHA2DS2-VASc Score is 5 (7.2-10% risk per year) and so patient would benefit from oral anticoagulation.  Continue metoprolol for now.  Chest pain with elevated troponin likely non-ST elevation MI.  Cardiology on board.  Will likely need cardiac authorization tomorrow.  Essential  hypertension Continue aspirin Lipitor, Lopressor.  Continue to hold HCTZ, Cardizem, Cozaar and  for now.  Hyperlipidemia. Continue Lipitor.  Diabetes mellitus type 2. -Last A1c was 7.1,  continue sliding scale insulin.  Will benefit from outpatient oral hypoglycemic events.  Stage 3b CKD Appears to be stable at this time we will continue to monitor creatinine levels.  Obstructive sleep apnea. CPAP.  History of remote colon cancer Status post surgery and chemotherapy.  COPD -Continue Albuterol prn  Chronic back pain -Continue Ultram.    Obesity -Patient would benefit from continued weight loss as outpatient.   DVT prophylaxis:  Lovenox subcu   Code Status: Full code  Family Communication:  I spoke with the patient's spouse on the phone and updated her about the clinical condition of the patient.   Status is: Inpatient  Remains inpatient appropriate because:IV treatments appropriate due to intensity of illness or inability to take PO and Inpatient level of care appropriate due to severity of illness   Dispo: The patient is from: Home              Anticipated d/c is to: Home  Anticipated day of discharge: 2 to 3 days              Patient currently is not medically stable to d/c.   Difficult to place patient No  Consultants:  Cardiology  Procedures:  None  Anti-infectives:  . None  Anti-infectives (From admission, onward)   None     Subjective: Today, patient was seen and examined at  bedside.  Denies any chest pain or dyspnea at this time.  No nausea vomiting fever or chills.  Objective: Vitals:   04/20/20 0418 04/20/20 0909  BP:  (!) 185/82  Pulse:    Resp:    Temp: 98.4 F (36.9 C)   SpO2:      Intake/Output Summary (Last 24 hours) at 04/20/2020 1158 Last data filed at 04/19/2020 2359 Gross per 24 hour  Intake 904.86 ml  Output --  Net 904.86 ml   Filed Weights   04/19/20 0544 04/19/20 1406 04/20/20 0613  Weight: 87.1 kg 90 kg 89.1  kg   Body mass index is 34.8 kg/m.   Physical Exam: GENERAL: Patient is alert awake and oriented. Not in obvious distress.  Obese HENT: No scleral pallor or icterus. Pupils equally reactive to light. Oral mucosa is moist NECK: is supple, no gross swelling noted. CHEST: Clear to auscultation. No crackles or wheezes.  Diminished breath sounds bilaterally. CVS: S1 and S2 heard, systolic murmur noted, regular rhythm at this time ABDOMEN: Soft, non-tender, bowel sounds are present. EXTREMITIES: No edema. CNS: Cranial nerves are intact. No focal motor deficits. SKIN: warm and dry without rashes.  Data Review: I have personally reviewed the following laboratory data and studies,  CBC: Recent Labs  Lab 04/19/20 0509 04/20/20 0258  WBC 10.4 9.3  HGB 14.5 12.4  HCT 44.3 37.3  MCV 89.1 88.6  PLT 253 466   Basic Metabolic Panel: Recent Labs  Lab 04/19/20 0509 04/20/20 0258  NA 137 140  K 3.8 3.6  CL 104 105  CO2 21* 23  GLUCOSE 237* 155*  BUN 34* 34*  CREATININE 1.30* 1.32*  CALCIUM 10.0 9.1   Liver Function Tests: No results for input(s): AST, ALT, ALKPHOS, BILITOT, PROT, ALBUMIN in the last 168 hours. No results for input(s): LIPASE, AMYLASE in the last 168 hours. No results for input(s): AMMONIA in the last 168 hours. Cardiac Enzymes: No results for input(s): CKTOTAL, CKMB, CKMBINDEX, TROPONINI in the last 168 hours. BNP (last 3 results) No results for input(s): BNP in the last 8760 hours.  ProBNP (last 3 results) No results for input(s): PROBNP in the last 8760 hours.  CBG: Recent Labs  Lab 04/19/20 1634 04/19/20 2127 04/20/20 0559 04/20/20 0735 04/20/20 1115  GLUCAP 190* 142* 139* 162* 117*   Recent Results (from the past 240 hour(s))  SARS CORONAVIRUS 2 (TAT 6-24 HRS) Nasopharyngeal Nasopharyngeal Swab     Status: None   Collection Time: 04/19/20  9:49 AM   Specimen: Nasopharyngeal Swab  Result Value Ref Range Status   SARS Coronavirus 2 NEGATIVE  NEGATIVE Final    Comment: (NOTE) SARS-CoV-2 target nucleic acids are NOT DETECTED.  The SARS-CoV-2 RNA is generally detectable in upper and lower respiratory specimens during the acute phase of infection. Negative results do not preclude SARS-CoV-2 infection, do not rule out co-infections with other pathogens, and should not be used as the sole basis for treatment or other patient management decisions. Negative results must be combined with clinical observations, patient history, and epidemiological information. The expected result is Negative.  Fact Sheet for Patients: SugarRoll.be  Fact Sheet for Healthcare Providers: https://www.woods-mathews.com/  This test is not yet approved or cleared by the [place] FDA and  has been authorized for detection and/or diagnosis of SARS-CoV-2 by FDA under an Emergency Use Authorization (EUA). This EUA will remain  in effect (meaning this test can be used) for the duration of the COVID-19 declaration under Se ction  564(b)(1) of the Act, 21 U.S.C. section 360bbb-3(b)(1), unless the authorization is terminated or revoked sooner.  Performed at [facility], [place] [address] [place], [place] [address]      Studies: DG Chest Portable 1 View  Result Date: 04/19/2020 CLINICAL DATA:  80 year old female with chest pain, atrial fibrillation, RVR. EXAM: PORTABLE CHEST 1 VIEW COMPARISON:  Portable chest 05/22/2015 and earlier. FINDINGS: Portable AP upright view at 0516 hours. Stable mild tortuosity of the descending thoracic aorta. Other mediastinal contours are within normal limits. Visualized tracheal air column is within normal limits. Allowing for portable technique the lungs are clear. No pneumothorax. Negative visible bowel gas and osseous structures. IMPRESSION: No acute cardiopulmonary abnormality. Electronically Signed   By: Genevie Ann M.D.   On: 04/19/2020 05:59   ECHOCARDIOGRAM COMPLETE  Result  Date: 04/19/2020    ECHOCARDIOGRAM REPORT   Patient Name:   [name] Date of Exam: 04/19/2020 Medical Rec #:  [id number]      Height:       63.0 in Accession #:    [id number]     Weight:       198.5 lb Date of Birth:  05-26-1940      BSA:          1.927 m Patient Age:    33 years       BP:           142/100 mmHg Patient Gender: F              HR:           93 bpm. Exam Location:  Inpatient Procedure: 2D Echo, Cardiac Doppler and Color Doppler Indications:    Atrial Fibrillation I48.91  History:        Patient has prior history of Echocardiogram examinations, most                 recent 05/23/2015. Arrythmias:Atrial Fibrillation; Risk                 Factors:Hypertension, Diabetes, Dyslipidemia, Sleep Apnea and                 Former Smoker.  Sonographer:    Vickie Epley RDCS Referring Phys: [place]  1. Possible fusion of noncoronary and left coronary cusps but no significant AS by doppler.  2. Left ventricular ejection fraction, by estimation, is 60 to 65%. The left ventricle has normal function. The left ventricle has no regional wall motion abnormalities. There is mild left ventricular hypertrophy. Left ventricular diastolic function could not be evaluated.  3. Right ventricular systolic function is normal. The right ventricular size is normal. Tricuspid regurgitation signal is inadequate for assessing PA pressure.  4. The mitral valve is normal in structure. Trivial mitral valve regurgitation. No evidence of mitral stenosis.  5. The aortic valve is abnormal. Aortic valve regurgitation is trivial. Mild to moderate aortic valve sclerosis/calcification is present, without any evidence of aortic stenosis.  6. The inferior vena cava is normal in size with greater than 50% respiratory variability, suggesting right atrial pressure of 3 mmHg. FINDINGS  Left Ventricle: Left ventricular ejection fraction, by estimation, is 60 to 65%. The left ventricle has normal function. The left ventricle has  no regional wall motion abnormalities. The left ventricular internal cavity size was normal in size. There is  mild left ventricular hypertrophy. Left ventricular diastolic function could not be evaluated due to atrial fibrillation. Left ventricular diastolic function could not  be evaluated. Right Ventricle: The right ventricular size is normal. Right ventricular systolic function is normal. Tricuspid regurgitation signal is inadequate for assessing PA pressure. The tricuspid regurgitant velocity is 2.51 m/s, and with an assumed right atrial  pressure of 3 mmHg, the estimated right ventricular systolic pressure is 61.6 mmHg. Left Atrium: Left atrial size was normal in size. Right Atrium: Right atrial size was normal in size. Pericardium: There is no evidence of pericardial effusion. Mitral Valve: The mitral valve is normal in structure. Mild mitral annular calcification. Trivial mitral valve regurgitation. No evidence of mitral valve stenosis. Tricuspid Valve: The tricuspid valve is normal in structure. Tricuspid valve regurgitation is mild . No evidence of tricuspid stenosis. Aortic Valve: The aortic valve is abnormal. Aortic valve regurgitation is trivial. Mild to moderate aortic valve sclerosis/calcification is present, without any evidence of aortic stenosis. Pulmonic Valve: The pulmonic valve was not well visualized. Pulmonic valve regurgitation is trivial. No evidence of pulmonic stenosis. Aorta: The aortic root is normal in size and structure. Venous: The inferior vena cava is normal in size with greater than 50% respiratory variability, suggesting right atrial pressure of 3 mmHg.  Additional Comments: Possible fusion of noncoronary and left coronary cusps but no significant AS by doppler.  LEFT VENTRICLE PLAX 2D LVIDd:         4.20 cm LVIDs:         3.10 cm LV PW:         0.90 cm LV IVS:        0.90 cm LVOT diam:     2.00 cm LV SV:         60 LV SV Index:   31 LVOT Area:     3.14 cm  LV Volumes (MOD) LV vol  d, MOD A2C: 85.5 ml LV vol d, MOD A4C: 75.5 ml LV vol s, MOD A2C: 33.4 ml LV vol s, MOD A4C: 34.6 ml LV SV MOD A2C:     52.1 ml LV SV MOD A4C:     75.5 ml LV SV MOD BP:      49.3 ml RIGHT VENTRICLE TAPSE (M-mode): 1.6 cm LEFT ATRIUM             Index       RIGHT ATRIUM           Index LA diam:        4.50 cm 2.33 cm/m  RA Area:     18.20 cm LA Vol (A2C):   41.6 ml 21.58 ml/m RA Volume:   50.50 ml  26.20 ml/m LA Vol (A4C):   43.6 ml 22.62 ml/m LA Biplane Vol: 43.9 ml 22.78 ml/m  AORTIC VALVE LVOT Vmax:   91.85 cm/s LVOT Vmean:  68.150 cm/s LVOT VTI:    0.192 m  AORTA Ao Root diam: 3.50 cm TRICUSPID VALVE TR Peak grad:   25.2 mmHg TR Vmax:        251.00 cm/s  SHUNTS Systemic VTI:  0.19 m Systemic Diam: 2.00 cm Kirk Ruths MD Electronically signed by Kirk Ruths MD Signature Date/Time: 04/19/2020/3:29:05 PM    Final       Flora Lipps, MD  [facility] 04/20/2020  If 7PM-7AM, please contact night-coverage

## 2020-04-21 ENCOUNTER — Encounter (HOSPITAL_COMMUNITY): Payer: Self-pay | Admitting: Cardiovascular Disease

## 2020-04-21 ENCOUNTER — Other Ambulatory Visit: Payer: Self-pay | Admitting: Physician Assistant

## 2020-04-21 ENCOUNTER — Inpatient Hospital Stay (HOSPITAL_COMMUNITY)
Admission: EM | Disposition: A | Discharge status: Home / Self Care | Payer: Self-pay | Source: Home / Self Care | Attending: Internal Medicine

## 2020-04-21 DIAGNOSIS — E6609 Other obesity due to excess calories: Secondary | ICD-10-CM | POA: Diagnosis not present

## 2020-04-21 DIAGNOSIS — I4891 Unspecified atrial fibrillation: Secondary | ICD-10-CM | POA: Diagnosis not present

## 2020-04-21 DIAGNOSIS — I214 Non-ST elevation (NSTEMI) myocardial infarction: Secondary | ICD-10-CM | POA: Diagnosis not present

## 2020-04-21 DIAGNOSIS — I2489 Other forms of acute ischemic heart disease: Secondary | ICD-10-CM | POA: Insufficient documentation

## 2020-04-21 DIAGNOSIS — I480 Paroxysmal atrial fibrillation: Secondary | ICD-10-CM

## 2020-04-21 DIAGNOSIS — I248 Other forms of acute ischemic heart disease: Secondary | ICD-10-CM | POA: Diagnosis not present

## 2020-04-21 DIAGNOSIS — M5441 Lumbago with sciatica, right side: Secondary | ICD-10-CM | POA: Diagnosis not present

## 2020-04-21 HISTORY — PX: LEFT HEART CATH AND CORONARY ANGIOGRAPHY: CATH118249

## 2020-04-21 LAB — CBC
HCT: 41.7 % (ref 36.0–46.0)
Hemoglobin: 14 g/dL (ref 12.0–15.0)
MCH: 29.7 pg (ref 26.0–34.0)
MCHC: 33.6 g/dL (ref 30.0–36.0)
MCV: 88.3 fL (ref 80.0–100.0)
Platelets: 212 10*3/uL (ref 150–400)
RBC: 4.72 MIL/uL (ref 3.87–5.11)
RDW: 14.3 % (ref 11.5–15.5)
WBC: 9.1 10*3/uL (ref 4.0–10.5)
nRBC: 0 % (ref 0.0–0.2)

## 2020-04-21 LAB — BASIC METABOLIC PANEL
Anion gap: 10 (ref 5–15)
BUN: 28 mg/dL — ABNORMAL HIGH (ref 8–23)
CO2: 23 mmol/L (ref 22–32)
Calcium: 9.1 mg/dL (ref 8.9–10.3)
Chloride: 107 mmol/L (ref 98–111)
Creatinine, Ser: 1.13 mg/dL — ABNORMAL HIGH (ref 0.44–1.00)
GFR, Estimated: 50 mL/min — ABNORMAL LOW (ref >60)
Glucose, Bld: 119 mg/dL — ABNORMAL HIGH (ref 70–99)
Potassium: 3.5 mmol/L (ref 3.5–5.1)
Sodium: 140 mmol/L (ref 135–145)

## 2020-04-21 LAB — GLUCOSE, CAPILLARY
Glucose-Capillary: 105 mg/dL — ABNORMAL HIGH (ref 70–99)
Glucose-Capillary: 96 mg/dL (ref 70–99)
Glucose-Capillary: 203 mg/dL — ABNORMAL HIGH (ref 70–99)
Glucose-Capillary: 140 mg/dL — ABNORMAL HIGH (ref 70–99)

## 2020-04-21 LAB — HEPARIN LEVEL (UNFRACTIONATED): Heparin Unfractionated: 0.52 IU/mL (ref 0.30–0.70)

## 2020-04-21 SURGERY — LEFT HEART CATH AND CORONARY ANGIOGRAPHY
Anesthesia: LOCAL

## 2020-04-21 MED ORDER — SODIUM CHLORIDE 0.9% FLUSH: 3.0000 mL | Freq: Two times a day (BID) | INTRAVENOUS | Status: DC

## 2020-04-21 MED ORDER — ONDANSETRON HCL 4 MG/2ML IJ SOLN: 4.0000 mg | Freq: Four times a day (QID) | INTRAMUSCULAR | Status: DC | PRN

## 2020-04-21 MED ORDER — LOPERAMIDE HCL 2 MG PO CAPS
2.0000 mg | ORAL_CAPSULE | ORAL | Status: DC | PRN
  Administered 2020-04-21: 2 mg via ORAL
  Filled 2020-04-21: qty 1

## 2020-04-21 MED ORDER — ACETAMINOPHEN 325 MG PO TABS: 650.0000 mg | ORAL_TABLET | ORAL | Status: DC | PRN

## 2020-04-21 MED ORDER — LIDOCAINE HCL (PF) 1 % IJ SOLN
INTRAMUSCULAR | Status: AC
  Filled 2020-04-21: qty 30

## 2020-04-21 MED ORDER — NITROGLYCERIN 1 MG/10 ML FOR IR/CATH LAB
INTRA_ARTERIAL | Status: AC
  Filled 2020-04-21: qty 10

## 2020-04-21 MED ORDER — MIDAZOLAM HCL 2 MG/2ML IJ SOLN
INTRAMUSCULAR | Status: AC
  Filled 2020-04-21: qty 2

## 2020-04-21 MED ORDER — DILTIAZEM HCL ER COATED BEADS 120 MG PO CP24
120.0000 mg | ORAL_CAPSULE | Freq: Every day | ORAL | Status: DC
  Administered 2020-04-21: 120 mg via ORAL
  Filled 2020-04-21: qty 1

## 2020-04-21 MED ORDER — SODIUM CHLORIDE 0.9 % IV SOLN: 250.0000 mL | INTRAVENOUS | Status: DC | PRN

## 2020-04-21 MED ORDER — FENTANYL CITRATE (PF) 100 MCG/2ML IJ SOLN
INTRAMUSCULAR | Status: DC | PRN
  Administered 2020-04-21 (×2): 25 ug via INTRAVENOUS

## 2020-04-21 MED ORDER — HEPARIN (PORCINE) IN NACL 1000-0.9 UT/500ML-% IV SOLN
INTRAVENOUS | Status: DC | PRN
  Administered 2020-04-21 (×2): 500 mL

## 2020-04-21 MED ORDER — HEPARIN SODIUM (PORCINE) 1000 UNIT/ML IJ SOLN
INTRAMUSCULAR | Status: DC | PRN
  Administered 2020-04-21: 4500 [IU] via INTRAVENOUS

## 2020-04-21 MED ORDER — FENTANYL CITRATE (PF) 100 MCG/2ML IJ SOLN
INTRAMUSCULAR | Status: AC
  Filled 2020-04-21: qty 2

## 2020-04-21 MED ORDER — MIDAZOLAM HCL 2 MG/2ML IJ SOLN
INTRAMUSCULAR | Status: DC | PRN
  Administered 2020-04-21: 1 mg via INTRAVENOUS

## 2020-04-21 MED ORDER — NITROGLYCERIN 1 MG/10 ML FOR IR/CATH LAB
INTRA_ARTERIAL | Status: DC | PRN
  Administered 2020-04-21: 3 mL via INTRA_ARTERIAL
  Administered 2020-04-21: 10 mL via INTRA_ARTERIAL

## 2020-04-21 MED ORDER — HYDRALAZINE HCL 20 MG/ML IJ SOLN: 10.0000 mg | INTRAMUSCULAR | Status: AC | PRN

## 2020-04-21 MED ORDER — HEPARIN (PORCINE) IN NACL 1000-0.9 UT/500ML-% IV SOLN
INTRAVENOUS | Status: AC
  Filled 2020-04-21: qty 1000

## 2020-04-21 MED ORDER — ATORVASTATIN CALCIUM 20 MG PO TABS: 20.0000 mg | ORAL_TABLET | Freq: Every day | ORAL | 2 refills | Status: DC

## 2020-04-21 MED ORDER — LOSARTAN POTASSIUM 100 MG PO TABS: 100.0000 mg | ORAL_TABLET | Freq: Every day | ORAL | Status: DC

## 2020-04-21 MED ORDER — DILTIAZEM HCL ER COATED BEADS 120 MG PO CP24: 120.0000 mg | ORAL_CAPSULE | Freq: Every day | ORAL | Status: DC

## 2020-04-21 MED ORDER — LABETALOL HCL 5 MG/ML IV SOLN: 10.0000 mg | INTRAVENOUS | Status: AC | PRN

## 2020-04-21 MED ORDER — FLECAINIDE ACETATE 50 MG PO TABS: 50.0000 mg | ORAL_TABLET | Freq: Two times a day (BID) | ORAL | 2 refills | Status: DC

## 2020-04-21 MED ORDER — LOSARTAN POTASSIUM 50 MG PO TABS
100.0000 mg | ORAL_TABLET | Freq: Every day | ORAL | Status: DC
  Administered 2020-04-21: 100 mg via ORAL
  Filled 2020-04-21: qty 2

## 2020-04-21 MED ORDER — HEPARIN SODIUM (PORCINE) 1000 UNIT/ML IJ SOLN
INTRAMUSCULAR | Status: AC
  Filled 2020-04-21: qty 1

## 2020-04-21 MED ORDER — VERAPAMIL HCL 2.5 MG/ML IV SOLN
INTRAVENOUS | Status: AC
  Filled 2020-04-21: qty 2

## 2020-04-21 MED ORDER — FLECAINIDE ACETATE 50 MG PO TABS
50.0000 mg | ORAL_TABLET | Freq: Two times a day (BID) | ORAL | Status: DC
  Administered 2020-04-21: 50 mg via ORAL
  Filled 2020-04-21: qty 1

## 2020-04-21 MED ORDER — APIXABAN 5 MG PO TABS: 5.0000 mg | ORAL_TABLET | Freq: Two times a day (BID) | ORAL | 2 refills | Status: DC

## 2020-04-21 MED ORDER — SODIUM CHLORIDE 0.9 % IV SOLN: INTRAVENOUS | Status: DC

## 2020-04-21 MED ORDER — LIDOCAINE HCL (PF) 1 % IJ SOLN
INTRAMUSCULAR | Status: DC | PRN
  Administered 2020-04-21: 2 mL via INTRADERMAL

## 2020-04-21 MED ORDER — SODIUM CHLORIDE 0.9% FLUSH
3.0000 mL | INTRAVENOUS | Status: DC | PRN
Start: 2020-04-21 — End: 2020-04-21

## 2020-04-21 MED ORDER — IOHEXOL 350 MG/ML SOLN
INTRAVENOUS | Status: DC | PRN
  Administered 2020-04-21: 55 mL via INTRA_ARTERIAL

## 2020-04-21 SURGICAL SUPPLY — 14 items
BAG SNAP BAND KOVER 36X36 (MISCELLANEOUS) ×1 IMPLANT
CATH INFINITI 5FR ANG PIGTAIL (CATHETERS) ×1 IMPLANT
CATH INFINITI JR4 5F (CATHETERS) ×1 IMPLANT
CATH OPTITORQUE TIG 4.0 5F (CATHETERS) ×1 IMPLANT
COVER DOME SNAP 22 D (MISCELLANEOUS) ×1 IMPLANT
DEVICE RAD COMP TR BAND LRG (VASCULAR PRODUCTS) ×1 IMPLANT
GLIDESHEATH SLEND A-KIT 6F 22G (SHEATH) ×1 IMPLANT
GUIDEWIRE INQWIRE 1.5J.035X260 (WIRE) IMPLANT
INQWIRE 1.5J .035X260CM (WIRE) ×2
KIT HEART LEFT (KITS) ×2 IMPLANT
PACK CARDIAC CATHETERIZATION (CUSTOM PROCEDURE TRAY) ×2 IMPLANT
TRANSDUCER W/STOPCOCK (MISCELLANEOUS) ×2 IMPLANT
TUBING CIL FLEX 10 FLL-RA (TUBING) ×2 IMPLANT
WIRE HI TORQ VERSACORE-J 145CM (WIRE) ×1 IMPLANT

## 2020-04-21 NOTE — Interval H&P Note (Signed)
Cath Lab Visit (complete for each Cath Lab visit)  Clinical Evaluation Leading to the Procedure:   ACS: Yes.    Non-ACS:    Anginal Classification: CCS II  Anti-ischemic medical therapy: Minimal Therapy (1 class of medications)  Non-Invasive Test Results: No non-invasive testing performed  Prior CABG: No previous CABG      History and Physical Interval Note:  04/21/2020 7:29 AM  [name]  has presented today for surgery, with the diagnosis of NSTEMI.  The various methods of treatment have been discussed with the patient and family. After consideration of risks, benefits and other options for treatment, the patient has consented to  Procedure(s): LEFT HEART CATH AND CORONARY ANGIOGRAPHY (N/A) as a surgical intervention.  The patient's history has been reviewed, patient examined, no change in status, stable for surgery.  I have reviewed the patient's chart and labs.  Questions were answered to the patient's satisfaction.     Quay Burow

## 2020-04-21 NOTE — Progress Notes (Signed)
Progress Note  Patient Name: [name] Date of Encounter: 04/21/2020  [facility] Cardiologist: Dorris Carnes, MD   Subjective   Pt denies CP or dyspnea  Inpatient Medications    Scheduled Meds: . aspirin EC  81 mg Oral Daily  . atorvastatin  20 mg Oral Daily  . colchicine  0.6 mg Oral BID  . docusate sodium  100 mg Oral BID  . famotidine  20 mg Oral BID  . insulin aspart  0-15 Units Subcutaneous TID WC  . insulin aspart  0-5 Units Subcutaneous QHS  . metoprolol tartrate  25 mg Oral BID  . off the beat book   Does not apply Once  . sodium chloride flush  3 mL Intravenous Q12H  . sodium chloride flush  3 mL Intravenous Q12H   Continuous Infusions: . sodium chloride 75 mL/hr at 04/21/20 0829  . sodium chloride     PRN Meds: sodium chloride, acetaminophen, acetaminophen, albuterol, baclofen, hydrALAZINE, labetalol, nitroGLYCERIN, ondansetron (ZOFRAN) IV, ondansetron (ZOFRAN) IV, sodium chloride flush, traMADol   Vital Signs    Vitals:   04/21/20 0759 04/21/20 0804 04/21/20 0809 04/21/20 0824  BP: (!) 148/77 139/72 (!) 149/80 (!) 160/76  Pulse: (!) 55 (!) 57 (!) 56 (!) 53  Resp: 12 16 19 18   Temp:    97.7 F (36.5 C)  TempSrc:    Oral  SpO2: 99% 97% 98% 97%  Weight:      Height:        Intake/Output Summary (Last 24 hours) at 04/21/2020 0831 Last data filed at 04/20/2020 2359 Gross per 24 hour  Intake 614.64 ml  Output -  Net 614.64 ml   Last 3 Weights 04/21/2020 04/20/2020 04/19/2020  Weight (lbs) 191 lb 3.2 oz 196 lb 6.9 oz 198 lb 8 oz  Weight (kg) 86.728 kg 89.1 kg 90.039 kg      Telemetry    NSR- Personally Reviewed  Physical Exam   GEN: No acute distress.  WD Neck: No JVD, supple Cardiac: RRR, 2/6 systolic murmur, no gallop Respiratory: CTA; no wheeze GI: Soft, nontender, non-distended, no masses MS: No edema, radial cath site with no hematoma Neuro:  grossly intact Psych: Normal affect   Labs    High Sensitivity Troponin:   Recent  Labs  Lab 04/19/20 0928 04/19/20 1140 04/19/20 1508 04/19/20 1829 04/19/20 2135  TROPONINIHS 165* 487* 972* 1,043* 1,102*      Chemistry Recent Labs  Lab 04/19/20 0509 04/20/20 0258 04/21/20 0157  NA 137 140 140  K 3.8 3.6 3.5  CL 104 105 107  CO2 21* 23 23  GLUCOSE 237* 155* 119*  BUN 34* 34* 28*  CREATININE 1.30* 1.32* 1.13*  CALCIUM 10.0 9.1 9.1  GFRNONAA 42* 41* 50*  ANIONGAP 12 12 10      Hematology Recent Labs  Lab 04/19/20 0509 04/20/20 0258 04/21/20 0157  WBC 10.4 9.3 9.1  RBC 4.97 4.21 4.72  HGB 14.5 12.4 14.0  HCT 44.3 37.3 41.7  MCV 89.1 88.6 88.3  MCH 29.2 29.5 29.7  MCHC 32.7 33.2 33.6  RDW 14.1 14.5 14.3  PLT 253 198 212    Radiology    CARDIAC CATHETERIZATION  Result Date: 04/21/2020 [name] is a 80 y.o. female  [id number] LOCATION:  FACILITY: [facility] PHYSICIAN: Quay Burow, M.D. 01-01-1941 DATE OF PROCEDURE:  04/21/2020 DATE OF DISCHARGE: CARDIAC CATHETERIZATION History obtained from chart review.80 y.o. female with past medical history of paroxysmal atrial fibrillation, hypertension, hyperlipidemia, diabetes mellitus,  obstructive sleep apnea, history of mild aortic stenosis, remote pulmonary embolus, previous resection of colon cancer admitted with chest pain and recurrent atrial fibrillation.  Echocardiogram shows normal LV function, mild left ventricular hypertrophy, aortic sclerosis without aortic stenosis, trace aortic insufficiency.  Troponins rose to the 1000 level suggesting non-STEMI.  The patient presents now for diagnostic coronary angiography to define her anatomy.   Ms. Ueda has essentially normal coronary arteries angiographically normal LV function by 2D echo.  I believe her chest pain and troponin leak were related to demand ischemia from her A. fib.  Medical therapy will be pursued.  The patient can start back on her oral anticoagulant for her PAF.  The sheath was removed and a TR band was placed on the right wrist to achieve  patent hemostasis.  The patient left the lab in stable condition. Quay Burow. MD, [facility] 04/21/2020 8:20 AM   ECHOCARDIOGRAM COMPLETE  Result Date: 04/19/2020    ECHOCARDIOGRAM REPORT   Patient Name:   [name] Date of Exam: 04/19/2020 Medical Rec #:  [id number]      Height:       63.0 in Accession #:    [id number]     Weight:       198.5 lb Date of Birth:  12-15-1940      BSA:          1.927 m Patient Age:    13 years       BP:           142/100 mmHg Patient Gender: F              HR:           93 bpm. Exam Location:  Inpatient Procedure: 2D Echo, Cardiac Doppler and Color Doppler Indications:    Atrial Fibrillation I48.91  History:        Patient has prior history of Echocardiogram examinations, most                 recent 05/23/2015. Arrythmias:Atrial Fibrillation; Risk                 Factors:Hypertension, Diabetes, Dyslipidemia, Sleep Apnea and                 Former Smoker.  Sonographer:    Vickie Epley RDCS Referring Phys: [place]  1. Possible fusion of noncoronary and left coronary cusps but no significant AS by doppler.  2. Left ventricular ejection fraction, by estimation, is 60 to 65%. The left ventricle has normal function. The left ventricle has no regional wall motion abnormalities. There is mild left ventricular hypertrophy. Left ventricular diastolic function could not be evaluated.  3. Right ventricular systolic function is normal. The right ventricular size is normal. Tricuspid regurgitation signal is inadequate for assessing PA pressure.  4. The mitral valve is normal in structure. Trivial mitral valve regurgitation. No evidence of mitral stenosis.  5. The aortic valve is abnormal. Aortic valve regurgitation is trivial. Mild to moderate aortic valve sclerosis/calcification is present, without any evidence of aortic stenosis.  6. The inferior vena cava is normal in size with greater than 50% respiratory variability, suggesting right atrial pressure of 3 mmHg. FINDINGS   Left Ventricle: Left ventricular ejection fraction, by estimation, is 60 to 65%. The left ventricle has normal function. The left ventricle has no regional wall motion abnormalities. The left ventricular internal cavity size was normal in size. There is  mild left ventricular hypertrophy. Left  ventricular diastolic function could not be evaluated due to atrial fibrillation. Left ventricular diastolic function could not be evaluated. Right Ventricle: The right ventricular size is normal. Right ventricular systolic function is normal. Tricuspid regurgitation signal is inadequate for assessing PA pressure. The tricuspid regurgitant velocity is 2.51 m/s, and with an assumed right atrial  pressure of 3 mmHg, the estimated right ventricular systolic pressure is 85.4 mmHg. Left Atrium: Left atrial size was normal in size. Right Atrium: Right atrial size was normal in size. Pericardium: There is no evidence of pericardial effusion. Mitral Valve: The mitral valve is normal in structure. Mild mitral annular calcification. Trivial mitral valve regurgitation. No evidence of mitral valve stenosis. Tricuspid Valve: The tricuspid valve is normal in structure. Tricuspid valve regurgitation is mild . No evidence of tricuspid stenosis. Aortic Valve: The aortic valve is abnormal. Aortic valve regurgitation is trivial. Mild to moderate aortic valve sclerosis/calcification is present, without any evidence of aortic stenosis. Pulmonic Valve: The pulmonic valve was not well visualized. Pulmonic valve regurgitation is trivial. No evidence of pulmonic stenosis. Aorta: The aortic root is normal in size and structure. Venous: The inferior vena cava is normal in size with greater than 50% respiratory variability, suggesting right atrial pressure of 3 mmHg.  Additional Comments: Possible fusion of noncoronary and left coronary cusps but no significant AS by doppler.  LEFT VENTRICLE PLAX 2D LVIDd:         4.20 cm LVIDs:         3.10 cm LV PW:          0.90 cm LV IVS:        0.90 cm LVOT diam:     2.00 cm LV SV:         60 LV SV Index:   31 LVOT Area:     3.14 cm  LV Volumes (MOD) LV vol d, MOD A2C: 85.5 ml LV vol d, MOD A4C: 75.5 ml LV vol s, MOD A2C: 33.4 ml LV vol s, MOD A4C: 34.6 ml LV SV MOD A2C:     52.1 ml LV SV MOD A4C:     75.5 ml LV SV MOD BP:      49.3 ml RIGHT VENTRICLE TAPSE (M-mode): 1.6 cm LEFT ATRIUM             Index       RIGHT ATRIUM           Index LA diam:        4.50 cm 2.33 cm/m  RA Area:     18.20 cm LA Vol (A2C):   41.6 ml 21.58 ml/m RA Volume:   50.50 ml  26.20 ml/m LA Vol (A4C):   43.6 ml 22.62 ml/m LA Biplane Vol: 43.9 ml 22.78 ml/m  AORTIC VALVE LVOT Vmax:   91.85 cm/s LVOT Vmean:  68.150 cm/s LVOT VTI:    0.192 m  AORTA Ao Root diam: 3.50 cm TRICUSPID VALVE TR Peak grad:   25.2 mmHg TR Vmax:        251.00 cm/s  SHUNTS Systemic VTI:  0.19 m Systemic Diam: 2.00 cm Kirk Ruths MD Electronically signed by Kirk Ruths MD Signature Date/Time: 04/19/2020/3:29:05 PM    Final     Patient Profile     81 y.o. female with past medical history of paroxysmal atrial fibrillation, hypertension, hyperlipidemia, diabetes mellitus, obstructive sleep apnea, history of mild aortic stenosis, remote pulmonary embolus, previous resection of colon cancer admitted with chest pain and recurrent atrial fibrillation.  Echocardiogram shows normal LV function, mild left ventricular hypertrophy, aortic sclerosis without aortic stenosis, trace aortic insufficiency.  Assessment & Plan    1 paroxysmal atrial fibrillation-patient remains in sinus rhythm today.  Her cardiac catheterization revealed no coronary disease.  Elevated troponin likely demand ischemia in the setting of atrial fibrillation.  Will discontinue metoprolol and instead treat with Cardizem CD 120 mg daily which was her home dose.  I will begin flecainide 50 mg twice daily to help maintain sinus rhythm.  She will need an exercise treadmill 1 to 2 weeks following discharge to  exclude exercise-induced ventricular tachycardia.  She is willing to try anticoagulation now.  We will begin apixaban 5 mg twice daily once cath site stable.  As outlined previously her LV function is normal.    2 non-ST elevation myocardial infarction-elevated troponin is likely secondary to demand ischemia as cardiac catheterization today reveals no coronary disease.  Continue statin in light of history of diabetes mellitus.  Discontinue aspirin given need for apixaban.  3 hypertension-blood pressure elevated.  Resume home dose of losartan and Cardizem.  4 history of mild aortic stenosis-not evident on most recent echocardiogram.  5 hyperlipidemia-continue statin.  Patient can be discharged later today if stable.  Continue present medications as listed in the [facility]  In addition would add apixaban 5 mg twice daily beginning tomorrow morning.  We will arrange exercise treadmill 1 to 2 weeks following discharge.  Follow-up APP 2 to 4 weeks.  Follow-up Dr. Harrington Challenger 3 months.  For questions or updates, please contact [place] Please consult [email or web address] for contact info under        Signed, Kirk Ruths, MD  04/21/2020, 8:31 AM

## 2020-04-21 NOTE — Plan of Care (Signed)
?  Problem: Education: ?Goal: Knowledge of General Education information will improve ?Description: Including pain rating scale, medication(s)/side effects and non-pharmacologic comfort measures ?Outcome: Progressing ?  ?Problem: Health Behavior/Discharge Planning: ?Goal: Ability to manage health-related needs will improve ?Outcome: Progressing ?  ?Problem: Education: ?Goal: Knowledge of disease or condition will improve ?Outcome: Progressing ?Goal: Understanding of medication regimen will improve ?Outcome: Progressing ?Goal: Individualized Educational Video(s) ?Outcome: Progressing ?  ?Problem: Activity: ?Goal: Ability to tolerate increased activity will improve ?Outcome: Progressing ?  ?Problem: Cardiac: ?Goal: Ability to achieve and maintain adequate cardiopulmonary perfusion will improve ?Outcome: Progressing ?  ?Problem: Health Behavior/Discharge Planning: ?Goal: Ability to safely manage health-related needs after discharge will improve ?Outcome: Progressing ?  ?

## 2020-04-21 NOTE — Discharge Summary (Signed)
Physician Discharge Summary  [name] UKG:[id number] DOB: 20-Nov-1940 DOA: 04/19/2020  PCP: Julie Koch, MD  Admit date: 04/19/2020 Discharge date: 04/21/2020  Admitted From: Home  Discharge disposition: Home   Recommendations for Outpatient Follow-Up:   . Follow up with your primary care provider in one week.  . Check CBC, BMP, magnesium in the next visit . Follow-up with cardiology clinic for possible stress test in 1 to 2 weeks. [name] Has been started on Eliquis and flecainide during hospitalization.  This will need to be monitored closely as outpatient . Patient's hemoglobin A1c was elevated.  Would benefit from adding oral hypoglycemic agents on outpatient basis.  Recommend diabetic diet at this time.   Discharge Diagnosis:   Principal Problem:   Atrial fibrillation with RVR (HCC) Active Problems:   Type 2 diabetes mellitus with hyperlipidemia (HCC)   Essential hypertension   SLEEP APNEA, OBSTRUCTIVE   Chronic back pain   Class 1 obesity due to excess calories with body mass index (BMI) of 34.0 to 34.9 in adult   Stage 3b chronic kidney disease ([place])   A-fib ([place])   Demand ischemia ([place])   Discharge Condition: Improved.  Diet recommendation: Low sodium, heart healthy.  Carbohydrate-modified.    Wound care: None.  Code status: Full.   History of Present Illness:   [name] a 80 y.o.femalewith medical history significant ofOSA not on CPAP; HTN; HLD; DM; colon cancer (2007); chronic back pain; and atrial fibrillation presented to the hospital with dizziness weakness headache and jaw pain.  She also had chest pain with some radiation to the arm and back.She knew she was in afib but was also concerned maybe she was having a heart attack.   In the ED, patient was noted to have atrial fibrillation with RVR and was initially started on Cardizem drip.  Patient had refused to anticoagulation initially but at this time she is on anticoagulation.   Cardiology has seen the patient and is recommending cardiac catheterization.   Hospital Course:   Following conditions were addressed during hospitalization as listed below,  Atrial fibrillation with RVR. Patient presented to the hospital with recurrent atrial fibrillation and RVR.  Initially was on Cardizem drip.  Cardiology was consulted for A. fib with RVR and elevated troponin.  Was on aspirin as outpatient.  Initially was reluctant on anticoagulation but subsequently was put on heparin drip.  TSH of 0.7.  Check 2D echocardiogram with left ventricle ejection fraction of 60 to 65%. CHA2DS2-VASc Scoreis5 (7.2-10% risk per year)and so patient would benefit from oral anticoagulation.  Patient is willing to try Eliquis on discharge.    Patient has been transitioned to Cardizem long-acting 120 mg daily.  Status post cardiac catheterization with no coronary artery disease.  Flecainide has been added to the regimen.  Will need to follow-up with cardiology in 1 to 2 weeks for stress test as outpatient.  Chest pain with elevated troponin likely demand ischemia. Cardiology was consulted.  Patient underwent cardiac catheterization with normal coronary arteries.  Essential hypertension Continue Cardizem, losartan  on discharge.  Hyperlipidemia. Continue Lipitor.  Diabetes mellitus type 2. -Last A1c was7.1,  Will benefit from outpatient oral hypoglycemic events.  Lifestyle modification and diabetic diet on discharge.  Stage 3b CKD Appears to be stable at this time.  Latest creatinine level of 1.1  Obstructive sleep apnea. Encouraged CPAP  History of remote colon cancer Status post surgery and chemotherapy.  Likely in remission  COPD Resume albuterol from  home  Chronic back pain -Continue Ultram.    Obesity -Patient would benefit from continued weight loss as outpatient.   Disposition.  At this time, patient is stable for disposition home.  Patient has been seen by cardiology  who recommended discharge home today.  Medical Consultants:    Cardiology  Procedures:    Cardiac catheterization 04/21/2020 Subjective:   Today, patient was seen and examined at bedside.Denies any chest pain, nausea vomiting, fever.    Wishes to go home.  Discharge Exam:   Vitals:   04/21/20 1045 04/21/20 1200  BP: (!) 147/94 (!) 141/71  Pulse: (!) 56 (!) 56  Resp:    Temp:    SpO2: 98% 97%   Vitals:   04/21/20 0809 04/21/20 0824 04/21/20 1045 04/21/20 1200  BP: (!) 149/80 (!) 160/76 (!) 147/94 (!) 141/71  Pulse: (!) 56 (!) 53 (!) 56 (!) 56  Resp: 19 18    Temp:  97.7 F (36.5 C)    TempSrc:  Oral    SpO2: 98% 97% 98% 97%  Weight:      Height:        General: Alert awake, not in obvious distress, obese HENT: pupils equally reacting to light,  No scleral pallor or icterus noted. Oral mucosa is moist.  Chest:  Clear breath sounds.  Diminished breath sounds bilaterally. No crackles or wheezes.  CVS: S1 &S2 heard, systolic murmur noted.  Regular rate and rhythm. Abdomen: Soft, nontender, nondistended.  Bowel sounds are heard.   Extremities: No cyanosis, clubbing or edema.  Peripheral pulses are palpable. Psych: Alert, awake and oriented, normal mood CNS:  No cranial nerve deficits.  Power equal in all extremities.   Skin: Warm and dry.  No rashes noted.  The results of significant diagnostics from this hospitalization (including imaging, microbiology, ancillary and laboratory) are listed below for reference.     Diagnostic Studies:   DG Chest Portable 1 View  Result Date: 04/19/2020 CLINICAL DATA:  80 year old female with chest pain, atrial fibrillation, RVR. EXAM: PORTABLE CHEST 1 VIEW COMPARISON:  Portable chest 05/22/2015 and earlier. FINDINGS: Portable AP upright view at 0516 hours. Stable mild tortuosity of the descending thoracic aorta. Other mediastinal contours are within normal limits. Visualized tracheal air column is within normal limits. Allowing for  portable technique the lungs are clear. No pneumothorax. Negative visible bowel gas and osseous structures. IMPRESSION: No acute cardiopulmonary abnormality. Electronically Signed   By: Genevie Ann M.D.   On: 04/19/2020 05:59   ECHOCARDIOGRAM COMPLETE  Result Date: 04/19/2020    ECHOCARDIOGRAM REPORT   Patient Name:   [name] Date of Exam: 04/19/2020 Medical Rec #:  [id number]      Height:       63.0 in Accession #:    [id number]     Weight:       198.5 lb Date of Birth:  Apr 21, 1940      BSA:          1.927 m Patient Age:    77 years       BP:           142/100 mmHg Patient Gender: F              HR:           93 bpm. Exam Location:  Inpatient Procedure: 2D Echo, Cardiac Doppler and Color Doppler Indications:    Atrial Fibrillation I48.91  History:        Patient has prior  history of Echocardiogram examinations, most                 recent 05/23/2015. Arrythmias:Atrial Fibrillation; Risk                 Factors:Hypertension, Diabetes, Dyslipidemia, Sleep Apnea and                 Former Smoker.  Sonographer:    Vickie Epley RDCS Referring Phys: [place]  1. Possible fusion of noncoronary and left coronary cusps but no significant AS by doppler.  2. Left ventricular ejection fraction, by estimation, is 60 to 65%. The left ventricle has normal function. The left ventricle has no regional wall motion abnormalities. There is mild left ventricular hypertrophy. Left ventricular diastolic function could not be evaluated.  3. Right ventricular systolic function is normal. The right ventricular size is normal. Tricuspid regurgitation signal is inadequate for assessing PA pressure.  4. The mitral valve is normal in structure. Trivial mitral valve regurgitation. No evidence of mitral stenosis.  5. The aortic valve is abnormal. Aortic valve regurgitation is trivial. Mild to moderate aortic valve sclerosis/calcification is present, without any evidence of aortic stenosis.  6. The inferior vena cava is  normal in size with greater than 50% respiratory variability, suggesting right atrial pressure of 3 mmHg. FINDINGS  Left Ventricle: Left ventricular ejection fraction, by estimation, is 60 to 65%. The left ventricle has normal function. The left ventricle has no regional wall motion abnormalities. The left ventricular internal cavity size was normal in size. There is  mild left ventricular hypertrophy. Left ventricular diastolic function could not be evaluated due to atrial fibrillation. Left ventricular diastolic function could not be evaluated. Right Ventricle: The right ventricular size is normal. Right ventricular systolic function is normal. Tricuspid regurgitation signal is inadequate for assessing PA pressure. The tricuspid regurgitant velocity is 2.51 m/s, and with an assumed right atrial  pressure of 3 mmHg, the estimated right ventricular systolic pressure is 09.9 mmHg. Left Atrium: Left atrial size was normal in size. Right Atrium: Right atrial size was normal in size. Pericardium: There is no evidence of pericardial effusion. Mitral Valve: The mitral valve is normal in structure. Mild mitral annular calcification. Trivial mitral valve regurgitation. No evidence of mitral valve stenosis. Tricuspid Valve: The tricuspid valve is normal in structure. Tricuspid valve regurgitation is mild . No evidence of tricuspid stenosis. Aortic Valve: The aortic valve is abnormal. Aortic valve regurgitation is trivial. Mild to moderate aortic valve sclerosis/calcification is present, without any evidence of aortic stenosis. Pulmonic Valve: The pulmonic valve was not well visualized. Pulmonic valve regurgitation is trivial. No evidence of pulmonic stenosis. Aorta: The aortic root is normal in size and structure. Venous: The inferior vena cava is normal in size with greater than 50% respiratory variability, suggesting right atrial pressure of 3 mmHg.  Additional Comments: Possible fusion of noncoronary and left coronary  cusps but no significant AS by doppler.  LEFT VENTRICLE PLAX 2D LVIDd:         4.20 cm LVIDs:         3.10 cm LV PW:         0.90 cm LV IVS:        0.90 cm LVOT diam:     2.00 cm LV SV:         60 LV SV Index:   31 LVOT Area:     3.14 cm  LV Volumes (MOD) LV vol d, MOD A2C:  85.5 ml LV vol d, MOD A4C: 75.5 ml LV vol s, MOD A2C: 33.4 ml LV vol s, MOD A4C: 34.6 ml LV SV MOD A2C:     52.1 ml LV SV MOD A4C:     75.5 ml LV SV MOD BP:      49.3 ml RIGHT VENTRICLE TAPSE (M-mode): 1.6 cm LEFT ATRIUM             Index       RIGHT ATRIUM           Index LA diam:        4.50 cm 2.33 cm/m  RA Area:     18.20 cm LA Vol (A2C):   41.6 ml 21.58 ml/m RA Volume:   50.50 ml  26.20 ml/m LA Vol (A4C):   43.6 ml 22.62 ml/m LA Biplane Vol: 43.9 ml 22.78 ml/m  AORTIC VALVE LVOT Vmax:   91.85 cm/s LVOT Vmean:  68.150 cm/s LVOT VTI:    0.192 m  AORTA Ao Root diam: 3.50 cm TRICUSPID VALVE TR Peak grad:   25.2 mmHg TR Vmax:        251.00 cm/s  SHUNTS Systemic VTI:  0.19 m Systemic Diam: 2.00 cm Kirk Ruths MD Electronically signed by Kirk Ruths MD Signature Date/Time: 04/19/2020/3:29:05 PM    Final      Labs:   Basic Metabolic Panel: Recent Labs  Lab 04/19/20 0509 04/20/20 0258 04/21/20 0157  NA 137 140 140  K 3.8 3.6 3.5  CL 104 105 107  CO2 21* 23 23  GLUCOSE 237* 155* 119*  BUN 34* 34* 28*  CREATININE 1.30* 1.32* 1.13*  CALCIUM 10.0 9.1 9.1   GFR Estimated Creatinine Clearance: 42.8 mL/min (A) (by C-G formula based on SCr of 1.13 mg/dL (H)). Liver Function Tests: No results for input(s): AST, ALT, ALKPHOS, BILITOT, PROT, ALBUMIN in the last 168 hours. No results for input(s): LIPASE, AMYLASE in the last 168 hours. No results for input(s): AMMONIA in the last 168 hours. Coagulation profile No results for input(s): INR, PROTIME in the last 168 hours.  CBC: Recent Labs  Lab 04/19/20 0509 04/20/20 0258 04/21/20 0157  WBC 10.4 9.3 9.1  HGB 14.5 12.4 14.0  HCT 44.3 37.3 41.7  MCV 89.1 88.6 88.3   PLT 253 198 212   Cardiac Enzymes: No results for input(s): CKTOTAL, CKMB, CKMBINDEX, TROPONINI in the last 168 hours. BNP: Invalid input(s): POCBNP CBG: Recent Labs  Lab 04/20/20 2202 04/21/20 0605 04/21/20 0825 04/21/20 1157 04/21/20 1341  GLUCAP 122* 105* 96 203* 140*   D-Dimer No results for input(s): DDIMER in the last 72 hours. Hgb A1c No results for input(s): HGBA1C in the last 72 hours. Lipid Profile No results for input(s): CHOL, HDL, LDLCALC, TRIG, CHOLHDL, LDLDIRECT in the last 72 hours. Thyroid function studies Recent Labs    04/19/20 0907  TSH 0.774   Anemia work up No results for input(s): VITAMINB12, FOLATE, FERRITIN, TIBC, IRON, RETICCTPCT in the last 72 hours. Microbiology Recent Results (from the past 240 hour(s))  SARS CORONAVIRUS 2 (TAT 6-24 HRS) Nasopharyngeal Nasopharyngeal Swab     Status: None   Collection Time: 04/19/20  9:49 AM   Specimen: Nasopharyngeal Swab  Result Value Ref Range Status   SARS Coronavirus 2 NEGATIVE NEGATIVE Final    Comment: (NOTE) SARS-CoV-2 target nucleic acids are NOT DETECTED.  The SARS-CoV-2 RNA is generally detectable in upper and lower respiratory specimens during the acute phase of infection. Negative results do not  preclude SARS-CoV-2 infection, do not rule out co-infections with other pathogens, and should not be used as the sole basis for treatment or other patient management decisions. Negative results must be combined with clinical observations, patient history, and epidemiological information. The expected result is Negative.  Fact Sheet for Patients: SugarRoll.be  Fact Sheet for Healthcare Providers: https://www.woods-mathews.com/  This test is not yet approved or cleared by the [place] FDA and  has been authorized for detection and/or diagnosis of SARS-CoV-2 by FDA under an Emergency Use Authorization (EUA). This EUA will remain  in effect (meaning  this test can be used) for the duration of the COVID-19 declaration under Se ction 564(b)(1) of the Act, 21 U.S.C. section 360bbb-3(b)(1), unless the authorization is terminated or revoked sooner.  Performed at [facility], [place] [address] [place], [place] [address]      Discharge Instructions:   Discharge Instructions    AMB Referral to Cardiac Rehabilitation - Phase II   Complete by: As directed    Diagnosis: NSTEMI   After initial evaluation and assessments completed: Virtual Based Care may be provided alone or in conjunction with Phase 2 Cardiac Rehab based on patient barriers.: Yes   Amb referral to AFIB Clinic   Complete by: As directed    Call MD for:   Complete by: As directed    Worsening symptoms, chest pain, increasing shortness of breath.   Diet - low sodium heart healthy   Complete by: As directed    Discharge instructions   Complete by: As directed    Follow-up with cardiology clinic (to be scheduled by cardiology).  you will likely need to have a stress test in 1 to 2 weeks.  Follow-up with your primary care physician in 1 to 2 weeks.  Continue to take any medications as prescribed.  Your dose of Cardizem has been changed to 120 mg daily.  You have been prescribed a blood thinner to be started from 04/22/2020.  Do not take aspirin with blood thinner.  Seek medical attention for worsening symptoms.   Increase activity slowly   Complete by: As directed      Allergies as of 04/21/2020      Reactions   Benazepril Other (See Comments)   coughing   Lopressor [metoprolol] Other (See Comments)   nervousness   Adhesive [tape] Other (See Comments)   redness   Morphine Other (See Comments)      Makes you feel bad all over   Nifedipine Hives, Itching   Piroxicam Hives, Itching      Medication List    STOP taking these medications   aspirin 81 MG tablet     TAKE these medications   Accu-Chek Aviva Plus test strip Generic drug: glucose blood TEST BLOOD  SUGAR TWICE DAILY   Accu-Chek Aviva Plus w/Device Kit Used to check blood sugars twice daily   accu-chek soft touch lancets Use to check blood sugars twice day   albuterol 108 (90 Base) MCG/ACT inhaler Commonly known as: VENTOLIN HFA Inhale 2 puffs into the lungs every 6 (six) hours as needed for wheezing or shortness of breath.   apixaban 5 MG Tabs tablet Commonly known as: ELIQUIS Take 1 tablet (5 mg total) by mouth 2 (two) times daily. Start taking on: April 22, 2020   atorvastatin 20 MG tablet Commonly known as: LIPITOR Take 1 tablet (20 mg total) by mouth daily. Start taking on: April 22, 2020   Colchicine 0.6 MG Caps Commonly known  as: Mitigare Take 1 tablet by mouth 2 (two) times daily.   diltiazem 120 MG 24 hr capsule Commonly known as: CARDIZEM CD Take 1 capsule (120 mg total) by mouth daily. What changed:   additional instructions  Another medication with the same name was removed. Continue taking this medication, and follow the directions you see here.   famotidine 20 MG tablet Commonly known as: PEPCID TAKE 1 TABLET BY MOUTH TWICE A DAY   flecainide 50 MG tablet Commonly known as: TAMBOCOR Take 1 tablet (50 mg total) by mouth every 12 (twelve) hours.   hydrocortisone cream 1 % Apply 1 application topically See admin instructions. 1 application under breast after showers and 1 application as needed for itching   losartan 100 MG tablet Commonly known as: COZAAR Take 1 tablet (100 mg total) by mouth daily. What changed: See the new instructions.   polyvinyl alcohol 1.4 % ophthalmic solution Commonly known as: LIQUIFILM TEARS Place 1 drop into both eyes as needed for dry eyes.   sodium chloride 0.65 % Soln nasal spray Commonly known as: OCEAN Place 1 spray into both nostrils as needed for congestion.   traMADol 50 MG tablet Commonly known as: ULTRAM Take 1 tablet (50 mg total) by mouth daily as needed. What changed: reasons to take this        Follow-up Information    Julie Koch, MD. Schedule an appointment as soon as possible for a visit in 1 week(s).   Specialty: Internal Medicine Contact information: [place] [address] [phone]        Fay Records, MD .   Specialty: Cardiology Contact information: [place] [address] [phone]                Time coordinating discharge: 39 minutes  Signed:  Laxman Pokhrel  [facility] 04/21/2020, 2:09 PM

## 2020-04-21 NOTE — TOC Benefit Eligibility Note (Signed)
Transition of Care [facility]) Benefit Eligibility Note    Patient Details  Name: [name] MRN: [id number] Date of Birth: 1940-10-26   Medication/Dose: Eliquis 5mg . BID  Covered?: Yes  Tier: 3 Drug  Prescription Coverage Preferred Pharmacy: [facility]  Spoke with Person/Company/Phone Number:: [place] [employer] PH# [phone]  Co-Pay: $45.00  Prior Approval: No  Deductible:  (No-Deductible)       Shelda Altes Phone Number: 04/21/2020, 12:36 PM

## 2020-04-23 ENCOUNTER — Telehealth: Payer: Self-pay | Admitting: Nurse Practitioner

## 2020-04-23 DIAGNOSIS — Z79899 Other long term (current) drug therapy: Secondary | ICD-10-CM

## 2020-04-23 DIAGNOSIS — I480 Paroxysmal atrial fibrillation: Secondary | ICD-10-CM

## 2020-04-23 NOTE — Telephone Encounter (Signed)
Received staff message from [place], [place] 1. TOC follow up in 1-2 weeks following heart cath - discharging today   2. Exercise treadmill stress test following new flecainide start - needs to be within 2 weeks - at [facility]   . Patient contacted regarding discharge from [facility] on 04/21/20.  Patient understands to follow up with Coletta Memos, NP on 04/29/20 at 11:15 am at [facility]. Patient understands discharge instructions? Yes Patient understands medications and regiment? Yes - see notes below Patient understands to bring all medications to this visit? Yes  Ask patient:  Are you enrolled in My Chart Yes  Spoke with patient who states she has stopped taking flecainide. She states she had a total of 3 doses but she has stopped it because it is making her feel nauseated, have headaches, shortness of breath and feels tired. She does not want to resume flecainide until she hears from Dr. Harrington Challenger that this is the best option for her. I advised that I will forward message to Dr. Harrington Challenger for advice. She resumed diltiazem 320 mg at home. Advised her that we will not schedule GXT at this time since she is not taking flecainide. Patient verbalized understanding and agreement and thanked me for the call.

## 2020-04-24 MED FILL — Verapamil HCl IV Soln 2.5 MG/ML: INTRAVENOUS | Qty: 2 | Status: AC

## 2020-04-28 NOTE — Progress Notes (Unsigned)
Cardiology Clinic Note   Patient Name: [name] Date of Encounter: 04/29/2020  Primary Care Provider:  Hoyt Koch, MD Primary Cardiologist:  Julie Carnes, MD  Patient Profile    [name] 80 year old female presents the clinic today for an evaluation for coronary artery disease and paroxysmal atrial fibrillation.  Past Medical History    Past Medical History:  Diagnosis Date  . Allergic rhinitis   . Allergy   . Anxiety    and panic attacks;pt states that she is claustrophobic  . Asthma   . Atrial fibrillation ([place])    Used to take Coumadin-instructed to stop  . Cataract    bilateral surgery to remove  . Chronic back pain    hx buldging disc  . Colon cancer ([place]) 2007   s/p surgery and chemo  . Constipation    takes Colace prn  . Diabetes mellitus    glimepiride  . DJD (degenerative joint disease)   . Fibromyalgia    but doesn't take any meds  . Gastric ulcer   . GERD (gastroesophageal reflux disease)    uses Tums prn  . Gout    takes Colchicine prn  . History of blood clots    right lung in early 90's  . History of colon polyps   . History of migraine headaches    last migraine 73yr ago;Pt states she does get sinus headaches  . History of PSVT (paroxysmal supraventricular tachycardia) no current problems  . History of shingles   . HLD (hyperlipidemia)    diet control - no meds  . Hypertension    takes Micardis and Diltiazem daily  . Osteoarthritis   . Peptic ulcer disease   . Peripheral edema    takes Furosemide every other day  . Sleep apnea    doesn't use CPAP   Past Surgical History:  Procedure Laterality Date  . ANKLE SURGERY     tumor-benign removed left ankle  . ANTERIOR CERVICAL DECOMP/DISCECTOMY FUSION  01/12/2011   Procedure: ANTERIOR CERVICAL DECOMPRESSION/DISCECTOMY FUSION 2 LEVELS;  Surgeon: [name];  [facility];  Service: Neurosurgery;  Laterality: Bilateral;  Cervical five-six, six-seven anterior  cervical discectomy and fusion with allograft and plating  . ANTERIOR CERVICAL DECOMP/DISCECTOMY FUSION  06/11/2011   Procedure: ANTERIOR CERVICAL DECOMPRESSION/DISCECTOMY FUSION 1 LEVEL/HARDWARE REMOVAL;  Surgeon: Julie Pitter MD;  [facility];  Service: Neurosurgery;  Laterality: N/A;  Cervical four-five anterior cervical decompression fusion with allograft and plating; Removal of Cervical five to seven plate  . bilateral cataracts removed    . caesarean section  1966/69/72   x 3  . CARDIAC CATHETERIZATION  90's and 2005  . CARPAL TUNNEL RELEASE Left 11/2013  . cataract surgery  2010  . COLECTOMY  2007   colon cancer  . COLONOSCOPY    . DILATION AND CURETTAGE OF UTERUS  02-19-11   & POLYP REMOVAL  . GANGLION CYST EXCISION  > [place]   removed from left pointer finger  . HEEL SPUR SURGERY Right 1992   x 2  . HYSTEROSCOPY WITH D & C  02/19/2011   Procedure: DILATATION AND CURETTAGE /HYSTEROSCOPY;  Surgeon: Julie AuerbachMD;  [facility];  Service: Gynecology;  Laterality: N/A;  requests one hour  . JOINT REPLACEMENT Left    knee  . knee arthrosocpy     bil;couple of years before knee replacement  . KNEE SURGERY     left TKA  .  LEFT HEART CATH AND CORONARY ANGIOGRAPHY N/A 04/21/2020   Procedure: LEFT HEART CATH AND CORONARY ANGIOGRAPHY;  Surgeon: Julie Harp, MD;  Location: [facility];  Service: Cardiovascular;  Laterality: N/A;  . LUMBAR [place] SURGERY  2008  . port a cath placed  2008  . port a cath removed  2008  . pyelonidal cystectomy     at age 77  . SHOULDER ARTHROSCOPY WITH SUBACROMIAL DECOMPRESSION AND OPEN ROTATOR C Right 03/16/2013   Procedure: RIGHT SHOULDER ARTHROSCOPY WITH SUBACROMIAL DECOMPRESSION AND OPEN ROTATOR CUFF REPAIR, OPEN DISTAL CLAVICLE  RESECTION POSSIBLE BICEP TENODESIS ;  Surgeon: Julie Schooling, MD;  Location: [facility];  Service: Orthopedics;  Laterality: Right;  . TRIGGER FINGER RELEASE Left    thumb, tendonitis, tumor  removed  . TUBAL LIGATION  1972    Allergies  Allergies  Allergen Reactions  . Benazepril Other (See Comments)    coughing  . Lopressor [Metoprolol] Other (See Comments)    nervousness  . Adhesive [Tape] Other (See Comments)    redness  . Morphine Other (See Comments)       Makes you feel bad all over  . Nifedipine Hives and Itching  . Piroxicam Hives and Itching    History of Present Illness    [name] is a PMH of paroxysmal atrial fibrillation, stage III CKD, type 2 diabetes, essential hypertension, chronic back pain, colon cancer 2007, and obesity.  Obstructive sleep apnea not on CPAP.  She presented to the hospital on 04/19/2020 and was discharged on 04/21/2020.  She complained of dizziness, weakness, and headache.  She was also noted to have jaw pain and chest pain with radiation to the arm and back.  She reported she knew she was in atrial fibrillation but was concerned about also having heart attack.  In the ED she was noted to have atrial fibrillation with RVR and was initiated on Cardizem drip.  She refused anticoagulation initially but then agreed to anticoagulation therapy.  She underwent cardiac catheterization on 04/21/2020.  It showed normal coronary angiography.  It was felt that her troponins were elevated due to demand ischemia from her atrial fibrillation.  Medical therapy was recommended.  She was discharged on flecainide with plans to follow-up at [facility] for exercise treadmill within 2 weeks.  However, she became nauseated while taking the medication and stopped.  Message has been sent to [name] for review.  She resumed diltiazem  daily.  She presents the clinic today for follow-up evaluation states felt nauseated, tired, dizzy, and had myalgias while she was on apixaban and flecainide.  She reports that she is feeling much better now on diltiazem and 81 mg aspirin.  We reviewed her increased risk of CVA.  She reports that she is aware of her risk and does  not wish to take blood thinning medication.  We went through step-by-step the CHA2DS2-VASc scoring.  She wishes to defer anticoagulation treatment at this time.  Her EKG today shows normal sinus rhythm left axis deviation 66 bpm she reports she is back to washing her vehicles and enjoys working in the yard.  We reviewed triggers for atrial fibrillation.  I will have her follow-up with [name] in 3 months.  Today she denies chest pain, shortness of breath, lower extremity edema, fatigue, palpitations, melena, hematuria, hemoptysis, diaphoresis, weakness, presyncope, syncope, orthopnea, and PND.   Home Medications    Prior to Admission medications   Medication Sig Start Date End Date Taking?  Authorizing Provider  ACCU-CHEK AVIVA PLUS test strip TEST BLOOD SUGAR TWICE DAILY 03/05/20   Julie Koch, MD  albuterol (PROVENTIL HFA;VENTOLIN HFA) 108 (90 Base) MCG/ACT inhaler Inhale 2 puffs into the lungs every 6 (six) hours as needed for wheezing or shortness of breath. 04/11/18   Julie Koch, MD  apixaban (ELIQUIS) 5 MG TABS tablet Take 1 tablet (5 mg total) by mouth 2 (two) times daily. 04/22/20 05/22/20  Pokhrel, Corrie Mckusick, MD  atorvastatin (LIPITOR) 20 MG tablet Take 1 tablet (20 mg total) by mouth daily. 04/22/20   Pokhrel, Corrie Mckusick, MD  Blood Glucose Monitoring Suppl (ACCU-CHEK AVIVA PLUS) w/Device KIT Used to check blood sugars twice daily 08/22/17   Julie Koch, MD  Colchicine (MITIGARE) 0.6 MG CAPS Take 1 tablet by mouth 2 (two) times daily. Patient not taking: Reported on 04/19/2020 03/05/20   Janith Lima, MD  diltiazem (CARDIZEM CD) 120 MG 24 hr capsule Take 1 capsule (120 mg total) by mouth daily. 04/21/20   Pokhrel, Corrie Mckusick, MD  famotidine (PEPCID) 20 MG tablet TAKE 1 TABLET BY MOUTH TWICE A DAY Patient not taking: Reported on 04/19/2020 04/10/20   Julie Koch, MD  flecainide (TAMBOCOR) 50 MG tablet Take 1 tablet (50 mg total) by mouth every 12 (twelve) hours. 04/21/20    Pokhrel, Corrie Mckusick, MD  hydrocortisone cream 1 % Apply 1 application topically See admin instructions. 1 application under breast after showers and 1 application as needed for itching    [provider]  Lancets (ACCU-CHEK SOFT TOUCH) lancets Use to check blood sugars twice day 08/22/17   Julie Koch, MD  losartan (COZAAR) 100 MG tablet Take 1 tablet (100 mg total) by mouth daily. 04/21/20   Pokhrel, Corrie Mckusick, MD  polyvinyl alcohol (LIQUIFILM TEARS) 1.4 % ophthalmic solution Place 1 drop into both eyes as needed for dry eyes.    [provider]  sodium chloride (OCEAN) 0.65 % SOLN nasal spray Place 1 spray into both nostrils as needed for congestion.    [provider]  traMADol (ULTRAM) 50 MG tablet Take 1 tablet (50 mg total) by mouth daily as needed. Patient taking differently: Take 50 mg by mouth daily as needed for moderate pain. 02/04/20   Julie Koch, MD    Family History    Family History  Problem Relation Age of Onset  . Heart attack Mother   . Stroke Mother   . Hypertension Mother   . Heart disease Father   . Hypertension Sister   . Kidney cancer Sister        left  . Bladder Cancer Brother   . Liver cancer Brother        spread to colon  . Coronary artery disease Other   . Anesthesia problems Neg Hx   . Hypotension Neg Hx   . Malignant hyperthermia Neg Hx   . Pseudochol deficiency Neg Hx   . Stomach cancer Neg Hx   . Rectal cancer Neg Hx    She indicated that her mother is deceased. She indicated that her father is deceased. She indicated that four of her five sisters are alive. She indicated that only one of her four brothers is alive. She indicated that her maternal grandmother is deceased. She indicated that her maternal grandfather is deceased. She indicated that her paternal grandmother is deceased. She indicated that her paternal grandfather is deceased. She indicated that the status of her neg hx is unknown. She indicated that  the  status of her other is unknown.  Social History    Social History   Socioeconomic History  . Marital status: Married    Spouse name: Not on file  . Number of children: 3  . Years of education: Not on file  . Highest education level: Not on file  Occupational History  . Occupation: retired  Tobacco Use  . Smoking status: Former Smoker    Packs/day: 1.50    Years: 30.00    Pack years: 45.00    Types: Cigarettes    Quit date: 02/23/1991    Years since quitting: 29.2  . Smokeless tobacco: Never Used  Vaping Use  . Vaping Use: Never used  Substance and Sexual Activity  . Alcohol use: No    Alcohol/week: 0.0 standard drinks  . Drug use: No  . Sexual activity: Yes    Partners: Male    Birth control/protection: Post-menopausal  Other Topics Concern  . Not on file  Social History Narrative   Regular exercise- no.   Social Determinants of Health   Financial Resource Strain: Not on file  Food Insecurity: Not on file  Transportation Needs: Not on file  Physical Activity: Not on file  Stress: Not on file  Social Connections: Not on file  Intimate Partner Violence: Not on file     Review of Systems    General:  No chills, fever, night sweats or weight changes.  Cardiovascular:  No chest pain, dyspnea on exertion, edema, orthopnea, palpitations, paroxysmal nocturnal dyspnea. Dermatological: No rash, lesions/masses Respiratory: No cough, dyspnea Urologic: No hematuria, dysuria Abdominal:   No nausea, vomiting, diarrhea, bright red blood per rectum, melena, or hematemesis Neurologic:  No visual changes, wkns, changes in mental status. All other systems reviewed and are otherwise negative except as noted above.  Physical Exam    VS:  BP 136/84   Pulse 72   Ht 5' 3"  (1.6 m)   Wt 194 lb 3.2 oz (88.1 kg)   LMP 12/30/2000   SpO2 98%   BMI 34.40 kg/m  , BMI Body mass index is 34.4 kg/m. GEN: Well nourished, well developed, in no acute distress. HEENT: normal. Neck:  Supple, no JVD, carotid bruits, or masses. Cardiac: RRR, no murmurs, rubs, or gallops. No clubbing, cyanosis, edema.  Radials/DP/PT 2+ and equal bilaterally.  Respiratory:  Respirations regular and unlabored, clear to auscultation bilaterally. GI: Soft, nontender, nondistended, BS + x 4. MS: no deformity or atrophy. Skin: warm and dry, no rash. Neuro:  Strength and sensation are intact. Psych: Normal affect.  Accessory Clinical Findings    Recent Labs: 03/03/2020: ALT 9 04/19/2020: TSH 0.774 04/21/2020: BUN 28; Creatinine, Ser 1.13; Hemoglobin 14.0; Platelets 212; Potassium 3.5; Sodium 140   Recent Lipid Panel    Component Value Date/Time   CHOL 236 (H) 02/04/2020 0926   CHOL 210 (H) 01/28/2020 0924   TRIG 190.0 (H) 02/04/2020 0926   HDL 59.20 02/04/2020 0926   HDL 58 01/28/2020 0924   CHOLHDL 4 02/04/2020 0926   VLDL 38.0 02/04/2020 0926   LDLCALC 139 (H) 02/04/2020 0926   LDLCALC 122 (H) 01/28/2020 0924   LDLDIRECT 123.0 08/03/2017 1031    ECG personally reviewed by me today-normal sinus rhythm left axis deviation 66 bpm- No acute changes  Echocardiogram 04/19/2020 IMPRESSIONS    1. Possible fusion of noncoronary and left coronary cusps but no  significant AS by doppler.  2. Left ventricular ejection fraction, by estimation, is 60 to 65%. The  left ventricle  has normal function. The left ventricle has no regional  wall motion abnormalities. There is mild left ventricular hypertrophy.  Left ventricular diastolic function  could not be evaluated.  3. Right ventricular systolic function is normal. The right ventricular  size is normal. Tricuspid regurgitation signal is inadequate for assessing  PA pressure.  4. The mitral valve is normal in structure. Trivial mitral valve  regurgitation. No evidence of mitral stenosis.  5. The aortic valve is abnormal. Aortic valve regurgitation is trivial.  Mild to moderate aortic valve sclerosis/calcification is present, without   any evidence of aortic stenosis.  6. The inferior vena cava is normal in size with greater than 50%  respiratory variability, suggesting right atrial pressure of 3 mmHg.  Cardiac catheterization 04/21/2020 History obtained from chart review.80 y.o.femalewith past medical history of paroxysmal atrial fibrillation, hypertension, hyperlipidemia, diabetes mellitus, obstructive sleep apnea, history of mild aortic stenosis, remote pulmonary embolus, previous resection of colon cancer admitted with chest pain and recurrent atrial fibrillation. Echocardiogram shows normal LV function, mild left ventricular hypertrophy,aortic sclerosis without aortic stenosis, trace aortic insufficiency.  Troponins rose to the 1000 level suggesting non-STEMI.  The patient presents now for diagnostic coronary angiography to define her anatomy.   IMPRESSION: Ms. Charlie has essentially normal coronary arteries angiographically normal LV function by 2D echo.  I believe her chest pain and troponin leak were related to demand ischemia from her A. fib.  Medical therapy will be pursued.  The patient can start back on her oral anticoagulant for her PAF.  The sheath was removed and a TR band was placed on the right wrist to achieve patent hemostasis.  The patient left the lab in stable condition.  Quay Burow. MD, [facility] 04/21/2020 8:20 AM Diagnostic Dominance: Right    Intervention    Assessment & Plan   1.  Atrial fibrillation with RVR/paroxysmal -EKG today shows normal sinus rhythm left axis deviation 66 bpm.  TSH 0.7.  Echocardiogram showed EF 60-65%.  .This patients CHA2DS2-VASc Score and unadjusted Ischemic Stroke Rate (% per year) is equal to 7.2 % stroke rate/year from a score of 5 Discharged on apixaban.  Initiated on flecainide however she became nauseated and stopped the medication.  ETT canceled.  She reports she also discontinued her apixaban and started back on 81 mg aspirin.  We reviewed her CHA2DS2-VASc  score and reviewed her risk.  She still wishes to defer anticoagulation dosing at this time. Continue aspirin, diltiazem Heart healthy low-sodium diet-salty 6 given Increase physical activity as tolerated   Essential hypertension-BP today 136/84.  Well-controlled at home. Continue diltiazem, losartan Heart healthy low-sodium diet-salty 6 given Increase physical activity as tolerated  Hyperlipidemia-02/04/2020: Cholesterol 236; HDL 59.20; LDL Cholesterol 139; Triglycerides 190.0; VLDL 38.0 Continue atorvastatin Heart healthy low-sodium diet-salty 6 given Increase physical activity as tolerated  OSA-reports intermittent CPAP compliance.  Discussed importance of CPAP use. 100% compliance encouraged  Chest pain/demand ischemia-underwent cardiac catheterization which showed normal coronary angiography.  Details above.  Cath site clean dry intact no drainage. Continue atorvastatin, diltiazem, Heart healthy low-sodium diet-salty 6 given Increase physical activity as tolerated  CKD stage III-creatinine 1.13 on 04/21/2020.  Baseline around 1.00. Follows with PCP  Disposition: Follow-up with [name] in 3 months.  Jossie Ng. Cleaver NP-C    04/29/2020, 11:40 AM [address] Office [phone] Fax [phone]  Notice: This dictation was prepared with Dragon dictation along with smaller phrase technology. Any transcriptional errors that result  from this process are unintentional and may not be corrected upon review.  I spent 15 minutes examining this patient, reviewing medications, and using patient centered shared decision making involving her cardiac care.  Prior to her visit I spent greater than 20 minutes reviewing her past medical history,  medications, and prior cardiac tests.

## 2020-04-29 ENCOUNTER — Encounter: Payer: Self-pay | Admitting: General Practice

## 2020-04-29 ENCOUNTER — Other Ambulatory Visit: Payer: Self-pay | Admitting: Internal Medicine

## 2020-04-29 ENCOUNTER — Ambulatory Visit: Payer: Medicare HMO | Admitting: General Practice

## 2020-04-29 ENCOUNTER — Other Ambulatory Visit: Payer: Self-pay

## 2020-04-29 VITALS — BP 136/84 | HR 72 | Ht 63.0 in | Wt 194.2 lb

## 2020-04-29 DIAGNOSIS — I480 Paroxysmal atrial fibrillation: Secondary | ICD-10-CM | POA: Diagnosis not present

## 2020-04-29 DIAGNOSIS — E1169 Type 2 diabetes mellitus with other specified complication: Secondary | ICD-10-CM | POA: Diagnosis not present

## 2020-04-29 DIAGNOSIS — E785 Hyperlipidemia, unspecified: Secondary | ICD-10-CM

## 2020-04-29 DIAGNOSIS — G4733 Obstructive sleep apnea (adult) (pediatric): Secondary | ICD-10-CM | POA: Diagnosis not present

## 2020-04-29 DIAGNOSIS — I248 Other forms of acute ischemic heart disease: Secondary | ICD-10-CM | POA: Diagnosis not present

## 2020-04-29 DIAGNOSIS — I10 Essential (primary) hypertension: Secondary | ICD-10-CM | POA: Diagnosis not present

## 2020-04-29 DIAGNOSIS — N183 Chronic kidney disease, stage 3 unspecified: Secondary | ICD-10-CM

## 2020-04-29 MED ORDER — ASPIRIN EC 81 MG PO TBEC: 81.0000 mg | DELAYED_RELEASE_TABLET | Freq: Every day | ORAL | 3 refills | Status: DC

## 2020-04-29 MED ORDER — DILTIAZEM HCL ER COATED BEADS 240 MG PO CP24: ORAL_CAPSULE | ORAL | 3 refills | Status: DC

## 2020-04-29 MED ORDER — DILTIAZEM HCL ER COATED BEADS 120 MG PO CP24: ORAL_CAPSULE | ORAL | Status: DC

## 2020-04-29 NOTE — Patient Instructions (Signed)
Medication Instructions:  START ASPIRIN 81MG   INCREASE DILTIAZEM TAKE 240MG  AND 120MG   *If you need a refill on your cardiac medications before your next appointment, please call your pharmacy*  Lab Work:   Testing/Procedures:  NONE    NONE  Special Instructions PLEASE READ AND FOLLOW SALTY 6-ATTACHED-1,800mg  daily  PLEASE INCREASE PHYSICAL ACTIVITY AS TOLERATED  Follow-Up: Your next appointment:  KEEP SCHEDULED APPOINTMENT In Person with Dorris Carnes, MD   At [facility], you and your health needs are our priority.  As part of our continuing mission to provide you with exceptional heart care, we have created designated Provider Care Teams.  These Care Teams include your primary Cardiologist (physician) and Advanced Practice Providers (APPs -  Physician Assistants and Nurse Practitioners) who all work together to provide you with the care you need, when you need it.            6 SALTY THINGS TO AVOID     1,800MG  DAILY

## 2020-05-01 DIAGNOSIS — M67431 Ganglion, right wrist: Secondary | ICD-10-CM | POA: Diagnosis not present

## 2020-05-12 ENCOUNTER — Telehealth: Payer: Self-pay

## 2020-05-12 NOTE — Telephone Encounter (Signed)
Received a call from the [facility] re: a GXT ordered as post hospital for Flecainide follow up but pt advised her that she was unable to walk on the treadmill.   According to Chales Abrahams NP note from 04/29/20 pt may not be on Flecainide but will forward to him to see if we can cancel the GXT order at this time.   "Atrial fibrillation with RVR/paroxysmal -EKG today shows normal sinus rhythm left axis deviation 66 bpm.  TSH 0.7.  Echocardiogram showed EF 60-65%.  .This patients CHA2DS2-VASc Score and unadjusted Ischemic Stroke Rate (% per year) is equal to 7.2 % stroke rate/year from a score of 5 Discharged on apixaban.  Initiated on flecainide however she became nauseated and stopped the medication.  ETT canceled.  She reports she also discontinued her apixaban and started back on 81 mg aspirin.  We reviewed her CHA2DS2-VASc score and reviewed her risk.  She still wishes to defer anticoagulation dosing at this time. Continue aspirin, diltiazem Heart healthy low-sodium diet-salty 6 given Increase physical activity as tolerated"

## 2020-05-15 ENCOUNTER — Telehealth: Payer: Self-pay

## 2020-05-15 NOTE — Telephone Encounter (Signed)
   [facility] Pre-operative Risk Assessment    HEARTCARE STAFF: - Please ensure there is not already an duplicate clearance open for this procedure. - Under Visit Info/Reason for Call, type in Other and utilize the format Clearance MM/DD/YY or Clearance TBD. Do not use dashes or single digits. - If request is for dental extraction, please clarify the # of teeth to be extracted.  Request for surgical clearance:  1. What type of surgery is being performed? Right Wrist Excision   2. When is this surgery scheduled? 05/19/20   3. What type of clearance is required (medical clearance vs. Pharmacy clearance to hold med vs. Both)? Both  4. Are there any medications that need to be held prior to surgery and how long? ASA   5. Practice name and name of physician performing surgery? [facility], Dr. Iran Planas    6. What is the office phone number? [phone]   7.   What is the office fax number? [phone]  8.   Anesthesia type (None, local, MAC, general) ?  IV Sedation and Block   Jacqulynn Cadet 05/15/2020, 5:01 PM  _________________________________________________________________   (provider comments below)

## 2020-05-16 NOTE — Telephone Encounter (Signed)
   Primary Cardiologist: Dorris Carnes, MD  Chart reviewed as part of pre-operative protocol coverage. Patient was contacted 05/16/2020 in reference to pre-operative risk assessment for pending surgery as outlined below.  [name] was last seen on 04/29/2020 by Coletta Memos NP.  Since that day, [name] has done well without chest pain or shortness of breath.  Recent cardiac catheterization in February 2022 showed essentially normal coronary arteries.  Although we initially placed her on Eliquis for her atrial fibrillation, however she has came off of Eliquis and transition to aspirin as she did not wish to take anticoagulation therapy. She may hold her aspirin for a few days prior to the procedure and restart as soon as possible after the procedure.  Talking with the patient, she says she has not taken aspirin for the past few days.  Therefore, based on ACC/AHA guidelines, the patient would be at acceptable risk for the planned procedure without further cardiovascular testing.   The patient was advised that if she develops new symptoms prior to surgery to contact our office to arrange for a follow-up visit, and she verbalized understanding.  I will route this recommendation to the requesting party via Epic fax function and remove from pre-op pool. Please call with questions.  [place], [place] 05/16/2020, 9:08 AM

## 2020-05-19 ENCOUNTER — Other Ambulatory Visit: Payer: Self-pay

## 2020-05-19 DIAGNOSIS — M67431 Ganglion, right wrist: Secondary | ICD-10-CM | POA: Diagnosis not present

## 2020-05-19 DIAGNOSIS — M67833 Other specified disorders of tendon, right wrist: Secondary | ICD-10-CM | POA: Diagnosis not present

## 2020-05-19 DIAGNOSIS — M65831 Other synovitis and tenosynovitis, right forearm: Secondary | ICD-10-CM | POA: Diagnosis not present

## 2020-06-12 ENCOUNTER — Other Ambulatory Visit: Payer: Self-pay | Admitting: Internal Medicine

## 2020-07-22 ENCOUNTER — Telehealth: Payer: Self-pay | Admitting: Internal Medicine

## 2020-07-22 DIAGNOSIS — M109 Gout, unspecified: Secondary | ICD-10-CM

## 2020-07-22 MED ORDER — COLCHICINE 0.6 MG PO CAPS: 1.0000 | ORAL_CAPSULE | Freq: Two times a day (BID) | ORAL | 0 refills | Status: DC

## 2020-07-22 NOTE — Telephone Encounter (Signed)
[facility] FYI:  --Caller states she is having a gout flare up on her left foot. Patient has a history of gout. Patient states her foot is warm to touch and red on the top of her foot. Denies a fever.  Advised to see PCP within 4 hours

## 2020-07-22 NOTE — Telephone Encounter (Signed)
Medication has been sent to the patient's pharmacy.  

## 2020-07-22 NOTE — Telephone Encounter (Signed)
1.Medication Requested: Colchicine (MITIGARE) 0.6 MG CAPS    2. Pharmacy (Name, Street, [place]): [id number] - [place], [place] - [address]  3. On Med List: yes   4. Last Visit with PCP: 03-03-20  5. Next visit date with PCP: n/a    Agent: Please be advised that RX refills may take up to 3 business days. We ask that you follow-up with your pharmacy.

## 2020-07-22 NOTE — Telephone Encounter (Signed)
LVM attempting to scheduled the patient to be seen in office today. Refill was sent to the patient's pharmacy.

## 2020-07-25 ENCOUNTER — Ambulatory Visit: Payer: Medicare HMO | Admitting: Internal Medicine

## 2020-07-30 ENCOUNTER — Other Ambulatory Visit: Payer: Self-pay | Admitting: Internal Medicine

## 2020-09-21 ENCOUNTER — Other Ambulatory Visit: Payer: Self-pay | Admitting: Internal Medicine

## 2020-09-30 ENCOUNTER — Telehealth: Payer: Self-pay | Admitting: Internal Medicine

## 2020-09-30 DIAGNOSIS — R42 Dizziness and giddiness: Secondary | ICD-10-CM

## 2020-09-30 NOTE — Telephone Encounter (Signed)
Reviewed telephone call   Please set up for carotid USN  Dx    dizziness  Will look at vertebral and carotid arteries   rule out stea

## 2020-09-30 NOTE — Telephone Encounter (Signed)
Spoke with the patient who reports that this morning she got up to fix breakfast and began to feel dizzy and could tell that her blood pressure was dropping. She states that she sat down and took her blood pressure. It was 111/57, HR 85. She states that this has happened in the past and occurs when she is using her arms. She states that it has also happened when she has been in the shower. She reports that she is also short of breath and has a headache. She is no longer dizzy and does not fee like she is going to pass out. Current blood pressure is 130/66. She states she is not having any palpitations and does not feel like she does when she is in A Fib. She has not taken any of her blood pressure medications this morning. Patient also reports that she has asthma and gets short of breath at times in which she has an inhaler for. I encouraged her to use it. She reports that she feels as if she gets up that her blood pressure will drop again. I advised her to rest and stay hydrated.  Patient also concerned about a lump that is under her breast bone. She states that it feels like growths that she has had in the past. She is concerned that this growth is close to her heart. I advised the patient that she needs to follow up with her PCP in regards to this as soon as possible.

## 2020-09-30 NOTE — Telephone Encounter (Signed)
Pt c/o BP issue: STAT if pt c/o blurred vision, one-sided weakness or slurred speech  1. What are your last 5 BP readings? Just one from this morning...Julie KitchenMarland KitchenMarland Cooper 130/69 , 119/57, 102/63, 121/66, 89/48   2. Are you having any other symptoms (ex. Dizziness, headache, blurred vision, passed out)? Feels faint, headache, when she uses arms she feels as if she is going to pass out   3. What is your BP issue? Blood pressure dropping   +++++++++++++++++++++++++++++++++++  Pt states that there is a growth on her chest  right at the bottom of her breast bone in between her ribs about the size of a golfball

## 2020-10-01 NOTE — Telephone Encounter (Signed)
Carotids ordered.  Called pt to let her know.  She has an appointment tomorrow with APP.

## 2020-10-02 ENCOUNTER — Other Ambulatory Visit: Payer: Self-pay

## 2020-10-02 ENCOUNTER — Ambulatory Visit: Payer: Medicare HMO | Admitting: Physician Assistant

## 2020-10-02 ENCOUNTER — Encounter: Payer: Self-pay | Admitting: Physician Assistant

## 2020-10-02 VITALS — BP 138/70 | HR 68 | Ht 63.0 in | Wt 194.8 lb

## 2020-10-02 DIAGNOSIS — I480 Paroxysmal atrial fibrillation: Secondary | ICD-10-CM

## 2020-10-02 DIAGNOSIS — R1012 Left upper quadrant pain: Secondary | ICD-10-CM | POA: Diagnosis not present

## 2020-10-02 DIAGNOSIS — R42 Dizziness and giddiness: Secondary | ICD-10-CM | POA: Diagnosis not present

## 2020-10-02 DIAGNOSIS — I10 Essential (primary) hypertension: Secondary | ICD-10-CM

## 2020-10-02 DIAGNOSIS — K439 Ventral hernia without obstruction or gangrene: Secondary | ICD-10-CM | POA: Diagnosis not present

## 2020-10-02 LAB — BASIC METABOLIC PANEL
BUN/Creatinine Ratio: 22 (ref 12–28)
BUN: 31 mg/dL — ABNORMAL HIGH (ref 8–27)
CO2: 21 mmol/L (ref 20–29)
Calcium: 9.7 mg/dL (ref 8.7–10.3)
Chloride: 102 mmol/L (ref 96–106)
Creatinine, Ser: 1.39 mg/dL — ABNORMAL HIGH (ref 0.57–1.00)
Glucose: 113 mg/dL — ABNORMAL HIGH (ref 65–99)
Potassium: 4.6 mmol/L (ref 3.5–5.2)
Sodium: 141 mmol/L (ref 134–144)
eGFR: 39 mL/min/{1.73_m2} — ABNORMAL LOW (ref >59)

## 2020-10-02 LAB — CBC
Hematocrit: 43.2 % (ref 34.0–46.6)
Hemoglobin: 14.5 g/dL (ref 11.1–15.9)
MCH: 30 pg (ref 26.6–33.0)
MCHC: 33.6 g/dL (ref 31.5–35.7)
MCV: 89 fL (ref 79–97)
Platelets: 273 10*3/uL (ref 150–450)
RBC: 4.84 x10E6/uL (ref 3.77–5.28)
RDW: 13.1 % (ref 11.7–15.4)
WBC: 9.7 10*3/uL (ref 3.4–10.8)

## 2020-10-02 NOTE — Progress Notes (Signed)
Cardiology Office Note:    Date:  10/02/2020   ID:  [name], DOB Aug 13, 1940, MRN [id number]  PCP:  Hoyt Koch, MD  [facility] Cardiologist:  Dorris Carnes, MD  [place] Electrophysiologist:  None   Chief Complaint: dizziness   History of Present Illness:    [name] is a 80 y.o. female with a hx of paroxysmal atrial fibrillation (declined anticoagulation) stage III CKD, type 2 diabetes, essential hypertension, chronic back pain, colon cancer 2007, obesity and obstructive sleep apnea not on CPAP presents for dizziness evaluation.   Admitted February 2022 for A. fib RVR.  Treated with diltiazem  drip.  Presenting symptoms was dizziness, weakness, headache and chest pain.  Cardiac cath showed normal coronaries.  It was felt that her elevated troponin more related to A. fib RVR.  She was discharged on flecainide but patient discontinued secondary to nausea.  She was also started on Eliquis but patient discontinued as well stating "I can't take it". Has easy bruising.  Patient is here for evaluation of dizziness.  2 days ago patient woke up and went to use bathroom.  She went to kitchen and making break fast when suddenly felt dizzy.  Felt like going to pass out.  She broke out in a sweat with heavy shoulder.  No chest pain.  She was nauseated but no vomiting.  She knew her blood pressure was low and it was 98/59.  Heart rate 85.  She lay down and symptoms resolved within 30 minutes with improved blood pressure of 130/60.  Heart rate 70. Reports symptoms gets worse with arm movement.  Patient reports she had few similar episode in past 2 months with systolic blood pressure in 70s.  Her symptoms is different than A. fib episode.  She denies palpitation, orthopnea, PND, syncope, lower extremity edema or melena.  Denies abdominal pain but noted left upper quadrant pain on exam with hernia about umbilical.  She also reports something growing at the xiphoid area.  She had a  previous soft tissue growth which needed to be takeout.   Past Medical History:  Diagnosis Date   Allergic rhinitis    Allergy    Anxiety    and panic attacks;pt states that she is claustrophobic   Asthma    Atrial fibrillation ([place])    Used to take Coumadin-instructed to stop   Cataract    bilateral surgery to remove   Chronic back pain    hx buldging disc   Colon cancer ([place]) 2007   s/p surgery and chemo   Constipation    takes Colace prn   Diabetes mellitus    glimepiride   DJD (degenerative joint disease)    Fibromyalgia    but doesn't take any meds   Gastric ulcer    GERD (gastroesophageal reflux disease)    uses Tums prn   Gout    takes Colchicine prn   History of blood clots    right lung in early 90's   History of colon polyps    History of migraine headaches    last migraine 54yr ago;Pt states she does get sinus headaches   History of PSVT (paroxysmal supraventricular tachycardia) no current problems   History of shingles    HLD (hyperlipidemia)    diet control - no meds   Hypertension    takes Micardis and Diltiazem daily   Osteoarthritis    Peptic ulcer disease    Peripheral edema    takes Furosemide  every other day   Sleep apnea    doesn't use CPAP    Past Surgical History:  Procedure Laterality Date   ANKLE SURGERY     tumor-benign removed left ankle   ANTERIOR CERVICAL DECOMP/DISCECTOMY FUSION  01/12/2011   Procedure: ANTERIOR CERVICAL DECOMPRESSION/DISCECTOMY FUSION 2 LEVELS;  Surgeon: Cooper Render Pool;  Location: [place] [facility];  Service: Neurosurgery;  Laterality: Bilateral;  Cervical five-six, six-seven anterior cervical discectomy and fusion with allograft and plating   ANTERIOR CERVICAL DECOMP/DISCECTOMY FUSION  06/11/2011   Procedure: ANTERIOR CERVICAL DECOMPRESSION/DISCECTOMY FUSION 1 LEVEL/HARDWARE REMOVAL;  Surgeon: Charlie Pitter, MD;  Location: [place] [facility];  Service: Neurosurgery;  Laterality: N/A;  Cervical four-five anterior cervical  decompression fusion with allograft and plating; Removal of Cervical five to seven plate   bilateral cataracts removed     caesarean section  1966/69/72   x 3   CARDIAC CATHETERIZATION  90's and 2005   CARPAL TUNNEL RELEASE Left 11/2013   cataract surgery  2010   COLECTOMY  2007   colon cancer   COLONOSCOPY     DILATION AND CURETTAGE OF UTERUS  02-19-11   & POLYP REMOVAL   GANGLION CYST EXCISION  > 7yr ago   removed from left pointer finger   HEEL SPUR SURGERY Right 1992   x 2   HYSTEROSCOPY WITH D & C  02/19/2011   Procedure: DILATATION AND CURETTAGE /HYSTEROSCOPY;  Surgeon: TAnastasio Auerbach MD;  [facility];  Service: Gynecology;  Laterality: N/A;  requests one hour   JOINT REPLACEMENT Left    knee   knee arthrosocpy     bil;couple of years before knee replacement   KNEE SURGERY     left TKA   LEFT HEART CATH AND CORONARY ANGIOGRAPHY N/A 04/21/2020   Procedure: LEFT HEART CATH AND CORONARY ANGIOGRAPHY;  Surgeon: BLorretta Harp MD;  [facility];  Service: Cardiovascular;  Laterality: N/A;   [place]  [place]   port a cath placed  2008   port a cath removed  2008   pyelonidal cystectomy     at age 80  SHOULDER ARTHROSCOPY WITH SUBACROMIAL DECOMPRESSION AND OPEN ROTATOR C Right 03/16/2013   Procedure: RIGHT SHOULDER ARTHROSCOPY WITH SUBACROMIAL DECOMPRESSION AND OPEN ROTATOR CUFF REPAIR, OPEN DISTAL CLAVICLE  RESECTION POSSIBLE BICEP TENODESIS ;  Surgeon: SAugustin Schooling MD;  [facility]  Service: Orthopedics;  Laterality: Right;   TRIGGER FINGER RELEASE Left    thumb, tendonitis, tumor removed   TUBAL LIGATION  1972    Current Medications: Current Meds  Medication Sig   ACCU-CHEK AVIVA PLUS test strip TEST BLOOD SUGAR TWICE DAILY   albuterol (PROVENTIL HFA;VENTOLIN HFA) 108 (90 Base) MCG/ACT inhaler Inhale 2 puffs into the lungs every 6 (six) hours as needed for wheezing or shortness of breath.   aspirin 325 MG tablet Take 325 mg  by mouth daily as needed. Only taking while afib episode   atorvastatin (LIPITOR) 20 MG tablet Take 1 tablet (20 mg total) by mouth daily.   Blood Glucose Monitoring Suppl (ACCU-CHEK AVIVA PLUS) w/Device KIT Used to check blood sugars twice daily   Colchicine (MITIGARE) 0.6 MG CAPS Take 1 tablet by mouth 2 (two) times daily.   diltiazem (CARDIZEM CD) 120 MG 24 hr capsule TAKE WITH 240MG=360MG DAILY   diltiazem (CARDIZEM CD) 240 MG 24 hr capsule TAKE WITH 120MG=360MG DAILY   hydrocortisone cream 1 % Apply 1 application topically See admin  instructions. 1 application under breast after showers and 1 application as needed for itching   Lancets (ACCU-CHEK SOFT TOUCH) lancets Use to check blood sugars twice day   losartan (COZAAR) 100 MG tablet Take 1 tablet (100 mg total) by mouth daily.   polyvinyl alcohol (LIQUIFILM TEARS) 1.4 % ophthalmic solution Place 1 drop into both eyes as needed for dry eyes.   sodium chloride (OCEAN) 0.65 % SOLN nasal spray Place 1 spray into both nostrils as needed for congestion.   traMADol (ULTRAM) 50 MG tablet Take 1 tablet (50 mg total) by mouth daily as needed.     Allergies:   Benazepril, Lopressor [metoprolol], Adhesive [tape], Morphine, Nifedipine, and Piroxicam   Social History   Socioeconomic History   Marital status: Married    Spouse name: Not on file   Number of children: 3   Years of education: Not on file   Highest education level: Not on file  Occupational History   Occupation: retired  Tobacco Use   Smoking status: Former    Packs/day: 1.50    Years: 30.00    Pack years: 45.00    Types: Cigarettes    Quit date: 02/23/1991    Years since quitting: 29.6   Smokeless tobacco: Never  Vaping Use   Vaping Use: Never used  Substance and Sexual Activity   Alcohol use: No    Alcohol/week: 0.0 standard drinks   Drug use: No   Sexual activity: Yes    Partners: Male    Birth control/protection: Post-menopausal  Other Topics Concern   Not on file   Social History Narrative   Regular exercise- no.   Social Determinants of Health   Financial Resource Strain: Not on file  Food Insecurity: Not on file  Transportation Needs: Not on file  Physical Activity: Not on file  Stress: Not on file  Social Connections: Not on file     Family History: The patient's family history includes Bladder Cancer in her brother; Coronary artery disease in an other family member; Heart attack in her mother; Heart disease in her father; Hypertension in her mother and sister; Kidney cancer in her sister; Liver cancer in her brother; Stroke in her mother. There is no history of Anesthesia problems, Hypotension, Malignant hyperthermia, Pseudochol deficiency, Stomach cancer, or Rectal cancer.    ROS:   Please see the history of present illness.    All other systems reviewed and are negative.   EKGs/Labs/Other Studies Reviewed:    The following studies were reviewed today:  LEFT HEART CATH AND CORONARY ANGIOGRAPHY 03/2020   IMPRESSION: [name] has essentially normal coronary arteries angiographically normal LV function by 2D echo.  I believe her chest pain and troponin leak were related to demand ischemia from her A. fib.  Medical therapy will be pursued.  The patient can start back on her oral anticoagulant for her PAF.  The sheath was removed and a TR band was placed on the right wrist to achieve patent hemostasis.  The patient left the lab in stable condition.   Echo 03/2020 1. Possible fusion of noncoronary and left coronary cusps but no  significant AS by doppler.   2. Left ventricular ejection fraction, by estimation, is 60 to 65%. The  left ventricle has normal function. The left ventricle has no regional  wall motion abnormalities. There is mild left ventricular hypertrophy.  Left ventricular diastolic function  could not be evaluated.   3. Right ventricular systolic function is normal. The right  ventricular  size is normal. Tricuspid  regurgitation signal is inadequate for assessing  PA pressure.   4. The mitral valve is normal in structure. Trivial mitral valve  regurgitation. No evidence of mitral stenosis.   5. The aortic valve is abnormal. Aortic valve regurgitation is trivial.  Mild to moderate aortic valve sclerosis/calcification is present, without  any evidence of aortic stenosis.   6. The inferior vena cava is normal in size with greater than 50%  respiratory variability, suggesting right atrial pressure of 3 mmHg.   EKG:  EKG is ordered today.  The ekg ordered today demonstrates NSR, QRS 61m  Recent Labs: 03/03/2020: ALT 9 04/19/2020: TSH 0.774 04/21/2020: BUN 28; Creatinine, Ser 1.13; Hemoglobin 14.0; Platelets 212; Potassium 3.5; Sodium 140  Recent Lipid Panel    Component Value Date/Time   CHOL 236 (H) 02/04/2020 0926   CHOL 210 (H) 01/28/2020 0924   TRIG 190.0 (H) 02/04/2020 0926   HDL 59.20 02/04/2020 0926   HDL 58 01/28/2020 0924   CHOLHDL 4 02/04/2020 0926   VLDL 38.0 02/04/2020 0926   LDLCALC 139 (H) 02/04/2020 0926   LDLCALC 122 (H) 01/28/2020 0924   LDLDIRECT 123.0 08/03/2017 1031     Risk Assessment/Calculations:    This indicates a 7.2% annual risk of stroke. The patient's score is based upon: CHF History: No HTN History: Yes Diabetes History: Yes Stroke History: No Vascular Disease History: No Age Score: 2 Gender Score: 1   Physical Exam:    VS:  BP 138/70   Pulse 68   Ht 5' 3"  (1.6 m)   Wt 194 lb 12.8 oz (88.4 kg)   LMP 12/30/2000   SpO2 99%   BMI 34.51 kg/m     Wt Readings from Last 3 Encounters:  10/02/20 194 lb 12.8 oz (88.4 kg)  04/29/20 194 lb 3.2 oz (88.1 kg)  04/21/20 191 lb 3.2 oz (86.7 kg)     GEN: Well nourished, well developed in no acute distress HEENT: Normal NECK: No JVD; No carotid bruits LYMPHATICS: No lymphadenopathy CARDIAC: RRR, 2/6 systolic murmurs, rubs, gallops RESPIRATORY:  Clear to auscultation without rales, wheezing or rhonchi   ABDOMEN: Soft, non-tender, non-distended MUSCULOSKELETAL:  No edema; No deformity  SKIN: Warm and dry NEUROLOGIC:  Alert and oriented x 3 PSYCHIATRIC:  Normal affect   ASSESSMENT AND PLAN:    Dizziness - 2 days ago patient had an episode of dizziness while working in [employer]  Associated with sweat and nausea.  Shoulder was heavy. Blood pressure was low.  She tried to move her arm and symptoms were getting worse.  Patient laid down with improved symptoms and blood pressure.  Had similar episode in past 1 to 2 months.  This is different than her atrial fibrillation episode. -Unknown etiology.   She had normal blood pressure in both arms.  ? Very mild pulse difference in L arm. Recent cardiac catheterization and echocardiogram were reassuring.  Checks x-ray was normal in February. -Records reviewed with DOD Dr. VIrish Lackalong with plan. -Murmur heard on exam but no evidence of valvular heart disease by echocardiogram in February.  She had possible fusion of noncoronary and left coronary cups by echo.   -No wide-complex QRS on EKG -Not orthostatic by vital during office visit. -Plan to get carotid Dopple as ordered.  Check CBC and BMP. -Close follow-up with Dr. RHarrington Challenger -See below  2.  Abdominal pain/above umbilical hernia -Patient denied any abdominal pain however noted tender abdomen at left upper quadrant  on exam.  She also has history of abdominal surgery.  She has huge hernia above her bellybutton.  Also reporting Knot under the xiphoid area.  Patient denies vomiting, constipation or diarrhea.  Unable to tell if symptoms were on empty stomach.  Recommended diet diary with symptom correlation.  She will follow-up with PCP for further work-up. -If persistent abdominal pain she will go to ER. -Questionable vagus nerve irritation leading to dizziness  3. PAF -Maintaining sinus rhythm.  Discussed anticoagulation but patient declined again.  She understands risk of stroke. - Only takes ASA  376m PRN  Medication Adjustments/Labs and Tests Ordered: Current medicines are reviewed at length with the patient today.  Concerns regarding medicines are outlined above.  Orders Placed This Encounter  Procedures   CBC   Basic Metabolic Panel (BMET)   EKG 12-Lead    No orders of the defined types were placed in this encounter.   Patient Instructions  Medication Instructions:  Your physician recommends that you continue on your current medications as directed. Please refer to the Current Medication list given to you today.  *If you need a refill on your cardiac medications before your next appointment, please call your pharmacy*   Lab Work: TODAY:  CBC & BMP  If you have labs (blood work) drawn today and your tests are completely normal, you will receive your results only by: [place](if you have MyChart) OR A paper copy in the mail If you have any lab test that is abnormal or we need to change your treatment, we will call you to review the results.   Testing/Procedures: Your physician has requested that you have a carotid duplex. This test is an ultrasound of the carotid arteries in your neck. It looks at blood flow through these arteries that supply the brain with blood. Allow one hour for this exam. There are no restrictions or special instructions.    Follow-Up: [facility] and your [facility] needs are our priority.  As part of our continuing mission to provide you with exceptional heart care, we have created designated Provider Care Teams.  These Care Teams include your primary Cardiologist (physician) and Advanced Practice Providers (APPs -  Physician Assistants and Nurse Practitioners) who all work together to provide you with the care you need, when you need it.  We recommend signing up for the patient portal called "MyChart".  Sign up information is provided on this After Visit Summary.  MyChart is used to connect with patients for Virtual Visits  (Telemedicine).  Patients are able to view lab/test results, encounter notes, upcoming appointments, etc.  Non-urgent messages can be sent to your provider as well.   To learn more about what you can do with MyChart, go to [email or web address]    Your next appointment:   2 month(s)  The format for your next appointment:   In Person  Provider:   You may see PDorris Carnes MD or one of the following Advanced Practice Providers on your designated Care Team:   SRichardson Dopp PA-C VRobbie Lis PA-C   Other Instructions    Signed, BLeanor Kail PUtah 10/02/2020 12:05 PM    [place]

## 2020-10-02 NOTE — Patient Instructions (Addendum)
Medication Instructions:  Your physician recommends that you continue on your current medications as directed. Please refer to the Current Medication list given to you today.  *If you need a refill on your cardiac medications before your next appointment, please call your pharmacy*   Lab Work: TODAY:  CBC & BMP  If you have labs (blood work) drawn today and your tests are completely normal, you will receive your results only by: [place] (if you have MyChart) OR A paper copy in the mail If you have any lab test that is abnormal or we need to change your treatment, we will call you to review the results.   Testing/Procedures: Your physician has requested that you have a carotid duplex. This test is an ultrasound of the carotid arteries in your neck. It looks at blood flow through these arteries that supply the brain with blood. Allow one hour for this exam. There are no restrictions or special instructions.    Follow-Up: At [facility], you and your health needs are our priority.  As part of our continuing mission to provide you with exceptional heart care, we have created designated Provider Care Teams.  These Care Teams include your primary Cardiologist (physician) and Advanced Practice Providers (APPs -  Physician Assistants and Nurse Practitioners) who all work together to provide you with the care you need, when you need it.  We recommend signing up for the patient portal called "MyChart".  Sign up information is provided on this After Visit Summary.  MyChart is used to connect with patients for Virtual Visits (Telemedicine).  Patients are able to view lab/test results, encounter notes, upcoming appointments, etc.  Non-urgent messages can be sent to your provider as well.   To learn more about what you can do with MyChart, go to [email or web address].    Your next appointment:   2 month(s)  The format for your next appointment:   In Person  Provider:   You may see  Dorris Carnes, MD or one of the following Advanced Practice Providers on your designated Care Team:   Richardson Dopp, PA-C Robbie Lis, [place]   Other Instructions

## 2020-10-06 ENCOUNTER — Ambulatory Visit: Payer: Medicare HMO | Admitting: Internal Medicine

## 2020-10-07 ENCOUNTER — Ambulatory Visit (INDEPENDENT_AMBULATORY_CARE_PROVIDER_SITE_OTHER): Payer: Medicare HMO | Admitting: Internal Medicine

## 2020-10-07 ENCOUNTER — Encounter: Payer: Self-pay | Admitting: Internal Medicine

## 2020-10-07 ENCOUNTER — Other Ambulatory Visit: Payer: Self-pay

## 2020-10-07 VITALS — BP 126/80 | HR 68 | Temp 98.3°F | Resp 18 | Ht 63.0 in | Wt 190.4 lb

## 2020-10-07 DIAGNOSIS — E1169 Type 2 diabetes mellitus with other specified complication: Secondary | ICD-10-CM | POA: Diagnosis not present

## 2020-10-07 DIAGNOSIS — E785 Hyperlipidemia, unspecified: Secondary | ICD-10-CM | POA: Diagnosis not present

## 2020-10-07 DIAGNOSIS — R1084 Generalized abdominal pain: Secondary | ICD-10-CM | POA: Diagnosis not present

## 2020-10-07 DIAGNOSIS — R42 Dizziness and giddiness: Secondary | ICD-10-CM | POA: Diagnosis not present

## 2020-10-07 DIAGNOSIS — I10 Essential (primary) hypertension: Secondary | ICD-10-CM | POA: Diagnosis not present

## 2020-10-07 DIAGNOSIS — K439 Ventral hernia without obstruction or gangrene: Secondary | ICD-10-CM | POA: Insufficient documentation

## 2020-10-07 LAB — HEMOGLOBIN A1C: Hgb A1c MFr Bld: 6.6 % — ABNORMAL HIGH (ref 4.6–6.5)

## 2020-10-07 LAB — VITAMIN B12: Vitamin B-12: 166 pg/mL — ABNORMAL LOW (ref 211–911)

## 2020-10-07 LAB — T4, FREE: Free T4: 0.86 ng/dL (ref 0.60–1.60)

## 2020-10-07 LAB — TSH: TSH: 2.38 u[IU]/mL (ref 0.35–5.50)

## 2020-10-07 MED ORDER — TRAMADOL HCL 50 MG PO TABS: 50.0000 mg | ORAL_TABLET | Freq: Every day | ORAL | 1 refills | Status: DC | PRN

## 2020-10-07 NOTE — Progress Notes (Signed)
   Subjective:   Patient ID: [name], female    DOB: [birth year], 80 y.o.   MRN: [id number]  HPI The patient is a 80 YO female coming in for concerns about hernia on stomach and intermittent low BP readings and other health concerns.  Review of Systems  Constitutional: Negative.   HENT: Negative.    Eyes: Negative.   Respiratory:  Negative for cough, chest tightness and shortness of breath.   Cardiovascular:  Negative for chest pain, palpitations and leg swelling.  Gastrointestinal:  Positive for abdominal distention and abdominal pain. Negative for constipation, diarrhea, nausea and vomiting.  Musculoskeletal:  Positive for arthralgias.  Skin: Negative.   Neurological:  Positive for light-headedness.  Psychiatric/Behavioral: Negative.     Objective:  Physical Exam Constitutional:      Appearance: She is well-developed. She is obese.  HENT:     Head: Normocephalic and atraumatic.  Cardiovascular:     Rate and Rhythm: Normal rate and regular rhythm.  Pulmonary:     Effort: Pulmonary effort is normal. No respiratory distress.     Breath sounds: Normal breath sounds. No wheezing or rales.  Abdominal:     General: Bowel sounds are normal. There is no distension.     Palpations: Abdomen is soft.     Tenderness: There is no abdominal tenderness. There is no rebound.     Hernia: A hernia is present.     Comments: Medium ventral hernia, prior scar from surgery midline.  Musculoskeletal:     Cervical back: Normal range of motion.  Skin:    General: Skin is warm and dry.  Neurological:     Mental Status: She is alert and oriented to person, place, and time.     Coordination: Coordination normal.    Vitals:   10/07/20 1017  BP: 126/80  Pulse: 68  Resp: 18  Temp: 98.3 F (36.8 C)  TempSrc: Oral  SpO2: 98%  Weight: 190 lb 6.4 oz (86.4 kg)  Height: '5\' 3"'$  (1.6 m)    This visit occurred during the SARS-CoV-2 public health emergency.  Safety protocols were in place,  including screening questions prior to the visit, additional usage of staff PPE, and extensive cleaning of exam room while observing appropriate contact time as indicated for disinfecting solutions.   Assessment & Plan:

## 2020-10-07 NOTE — Assessment & Plan Note (Signed)
Spoke with cardiology about this earlier this week and they did labs which were normal and plan to do carotid doppler. We will also do thyroid, vitamin and sugar labs to ensure no metabolic cause. These seem to happen once a month or less and she is asked to track dietary triggers (when eating and how much around episodes and fluid intake) to see if this has a pattern or trigger.

## 2020-10-07 NOTE — Assessment & Plan Note (Signed)
She does not have any symptoms currently. She does strain with constipation and is advised to avoid that. I would recommend against surgical fix at this time.

## 2020-10-07 NOTE — Assessment & Plan Note (Signed)
Review of her BP log indicates good control overall with 4 low BP readings with symptoms over the last 8 months. She is getting carotid doppler upcoming. She is encouraged to continue diltiazem and losartan for now.

## 2020-10-07 NOTE — Assessment & Plan Note (Signed)
No recent follow up and checking HgA1c today. Adjust as needed. Diet controlled currently. On ARB and statin.

## 2020-10-07 NOTE — Patient Instructions (Signed)
The hernia is likely not worth fixing at this time.  I would recommend to keep a food diary with the low blood pressure to see if there is a pattern to what you are eating or when.

## 2020-10-07 NOTE — Assessment & Plan Note (Signed)
She is not taking pepcid as she states it bothered her stomach. Her pain is much improved from January and we reviewed relevant findings from that CT scan Feb 2022.

## 2020-10-10 ENCOUNTER — Ambulatory Visit (HOSPITAL_COMMUNITY)
Admission: RE | Admit: 2020-10-10 | Discharge: 2020-10-10 | Disposition: A | Discharge status: Home / Self Care | Payer: Medicare HMO | Source: Ambulatory Visit | Attending: Cardiovascular Disease | Admitting: Cardiovascular Disease

## 2020-10-10 ENCOUNTER — Other Ambulatory Visit: Payer: Self-pay

## 2020-10-10 ENCOUNTER — Encounter (HOSPITAL_COMMUNITY): Payer: Medicare HMO

## 2020-10-10 DIAGNOSIS — R42 Dizziness and giddiness: Secondary | ICD-10-CM | POA: Insufficient documentation

## 2020-10-15 ENCOUNTER — Telehealth: Payer: Self-pay | Admitting: Internal Medicine

## 2020-10-15 NOTE — Telephone Encounter (Signed)
Please call patient with lab results

## 2020-10-15 NOTE — Telephone Encounter (Signed)
See result note.  

## 2020-10-17 ENCOUNTER — Telehealth: Payer: Self-pay | Admitting: *Deleted

## 2020-10-17 ENCOUNTER — Other Ambulatory Visit: Payer: Self-pay

## 2020-10-17 ENCOUNTER — Other Ambulatory Visit: Payer: Medicare HMO | Admitting: *Deleted

## 2020-10-17 ENCOUNTER — Ambulatory Visit (INDEPENDENT_AMBULATORY_CARE_PROVIDER_SITE_OTHER): Payer: Medicare HMO

## 2020-10-17 DIAGNOSIS — E538 Deficiency of other specified B group vitamins: Secondary | ICD-10-CM

## 2020-10-17 DIAGNOSIS — Z79899 Other long term (current) drug therapy: Secondary | ICD-10-CM

## 2020-10-17 LAB — LIPID PANEL
Chol/HDL Ratio: 2.3 ratio (ref 0.0–4.4)
Cholesterol, Total: 164 mg/dL (ref 100–199)
HDL: 72 mg/dL (ref >39)
LDL Chol Calc (NIH): 64 mg/dL (ref 0–99)
Triglycerides: 172 mg/dL — ABNORMAL HIGH (ref 0–149)
VLDL Cholesterol Cal: 28 mg/dL (ref 5–40)

## 2020-10-17 MED ORDER — CYANOCOBALAMIN 1000 MCG/ML IJ SOLN
1000.0000 ug | Freq: Once | INTRAMUSCULAR | Status: AC
  Administered 2020-10-17: 1000 ug via INTRAMUSCULAR

## 2020-10-17 NOTE — Telephone Encounter (Signed)
-----   Message from [place], MD sent at 10/14/2020 11:03 PM EDT ----- Mild plaquing of carotid arteries   Does not explain dizziness With this would recomm fasting lipids at her convenience

## 2020-10-17 NOTE — Progress Notes (Signed)
Pt here for monthly B12 injection per Dr. Sharlet Salina  B12 1063mg given IM, and pt tolerated injection well.  Next B12 injection scheduled for 01/02/21.

## 2020-10-23 ENCOUNTER — Other Ambulatory Visit: Payer: Self-pay | Admitting: Internal Medicine

## 2020-10-25 ENCOUNTER — Telehealth: Payer: Self-pay | Admitting: Internal Medicine

## 2020-10-25 NOTE — Telephone Encounter (Signed)
Refill request sent via fax on 10/21/20 for: Tramadol 50 MG tablet  Please advise.

## 2020-10-26 ENCOUNTER — Telehealth: Payer: Self-pay | Admitting: Internal Medicine

## 2020-10-28 MED ORDER — ACCU-CHEK AVIVA PLUS VI STRP: ORAL_STRIP | 1 refills | Status: DC

## 2020-10-28 NOTE — Telephone Encounter (Signed)
This is not due for refill until 12/07/20 based on last rx from our office. She should contact pharmacy.

## 2020-10-28 NOTE — Telephone Encounter (Signed)
Called patient. LVM letting her know that she contact her pharmacy in regards to this refill. Office number was provided.

## 2020-10-28 NOTE — Telephone Encounter (Signed)
Medication has been sent to the pharmacy. 

## 2020-10-28 NOTE — Telephone Encounter (Signed)
See below

## 2020-10-31 ENCOUNTER — Ambulatory Visit: Payer: Medicare HMO

## 2020-11-05 ENCOUNTER — Ambulatory Visit (INDEPENDENT_AMBULATORY_CARE_PROVIDER_SITE_OTHER): Payer: Medicare HMO

## 2020-11-05 ENCOUNTER — Other Ambulatory Visit: Payer: Self-pay

## 2020-11-05 DIAGNOSIS — E538 Deficiency of other specified B group vitamins: Secondary | ICD-10-CM | POA: Diagnosis not present

## 2020-11-05 MED ORDER — CYANOCOBALAMIN 1000 MCG/ML IJ SOLN
1000.0000 ug | Freq: Once | INTRAMUSCULAR | Status: AC
  Administered 2020-11-05: 1000 ug via INTRAMUSCULAR

## 2020-11-05 NOTE — Progress Notes (Signed)
B12 injection given w/o any complications.

## 2020-11-10 ENCOUNTER — Other Ambulatory Visit: Payer: Self-pay | Admitting: Internal Medicine

## 2020-11-19 ENCOUNTER — Ambulatory Visit (INDEPENDENT_AMBULATORY_CARE_PROVIDER_SITE_OTHER): Payer: Medicare HMO

## 2020-11-19 ENCOUNTER — Other Ambulatory Visit: Payer: Self-pay

## 2020-11-19 DIAGNOSIS — E538 Deficiency of other specified B group vitamins: Secondary | ICD-10-CM

## 2020-11-19 MED ORDER — CYANOCOBALAMIN 1000 MCG/ML IJ SOLN
1000.0000 ug | Freq: Once | INTRAMUSCULAR | Status: AC
  Administered 2020-11-19: 1000 ug via INTRAMUSCULAR

## 2020-11-19 NOTE — Progress Notes (Signed)
Pt was given B12 injection w/o any complications. 

## 2020-12-04 ENCOUNTER — Ambulatory Visit (INDEPENDENT_AMBULATORY_CARE_PROVIDER_SITE_OTHER): Payer: Medicare HMO

## 2020-12-04 ENCOUNTER — Other Ambulatory Visit: Payer: Self-pay

## 2020-12-04 DIAGNOSIS — E538 Deficiency of other specified B group vitamins: Secondary | ICD-10-CM | POA: Diagnosis not present

## 2020-12-04 MED ORDER — CYANOCOBALAMIN 1000 MCG/ML IJ SOLN
1000.0000 ug | Freq: Once | INTRAMUSCULAR | Status: AC
  Administered 2020-12-04: 1000 ug via INTRAMUSCULAR

## 2020-12-04 NOTE — Progress Notes (Signed)
Pt was given B12 w/o any complications. 

## 2020-12-05 ENCOUNTER — Ambulatory Visit: Payer: Medicare HMO

## 2020-12-10 ENCOUNTER — Ambulatory Visit: Payer: Medicare HMO

## 2021-01-01 NOTE — Progress Notes (Signed)
Cardiology Office Note   Date:  01/02/2021   ID:  [name], DOB 02/08/1941, MRN [id number]  PCP:  Hoyt Koch, MD  Cardiologist:   Dorris Carnes, MD    Pt presents for follow up of PAF and HTN     History of Present Illness: [name] is a 80 y.o. female with a history of PAF (pt refused anticoagulation), AS, HTN, DM, HL, PUD, asthma, gout, OSA, remote PE (1990s)   I saw her in clinic in 2017   She has been seen several times by Kathleen Argue, the last time as a televisit in Nov 2020  The pt says she is still having some afib  Last spell in October  Lasted 6 hours   Took extra 120 mg dilt    BP was normal but HR was up Prior to that, the previous spell  was in the spring  2021. Spells usually start at midnight or 2 AM      BP at home 130/ 70 to 80s    But BP can drop to 80s/  Cant take HCTZ as BP will drop    Pt says she is active at home   Cares for husband  Works in yard  No problems with CP    Breathing is OK    I saw the pt in Dec 2021  She was admitted in Feb 2022 with AFbi with RVR    Rx with diltiazem   Placed on flecanide which was discontinued due to nausea   Last see by [name]  in Aug 2022   COmplained of dizzienss .  BP low   Carotid USN showed mild CV dz    Since seen the pt denies dizziness  She says her breathing is OK  She has been busy with sweeping leaves    On Sunday her BP was high   Noted pain in both shoulders and between shoulder blades    Patient says leaves falling  Busy   Sunday night she hurt between shoulder blades    Took pain med    Next night hurt again  BP 189  Took 1/2losartan At other times BP has been low   90/   Even 72/ at one point  Hurt between shoulder blades when happened    Current Meds  Medication Sig   albuterol (PROVENTIL HFA;VENTOLIN HFA) 108 (90 Base) MCG/ACT inhaler Inhale 2 puffs into the lungs every 6 (six) hours as needed for wheezing or shortness of breath.   aspirin 325 MG tablet Take 325 mg by mouth daily as  needed. Only taking while afib episode   aspirin EC 81 MG tablet Take 1 tablet (81 mg total) by mouth daily. Swallow whole.   Blood Glucose Monitoring Suppl (ACCU-CHEK AVIVA PLUS) w/Device KIT Used to check blood sugars twice daily   Colchicine (MITIGARE) 0.6 MG CAPS Take 1 tablet by mouth 2 (two) times daily.   diltiazem (CARDIZEM CD) 120 MG 24 hr capsule TAKE 1 CAP DAILY. TAKE W/DILTIAZEM 240MG FOR 360MG/DAY. MAY TAKE 1 ADDITIONAL CAP AS NEEDED FOR BREAKTHROUGH AFIB   diltiazem (CARDIZEM CD) 240 MG 24 hr capsule TAKE WITH 120MG=360MG DAILY   glucose blood (ACCU-CHEK AVIVA PLUS) test strip TEST BLOOD SUGAR TWICE DAILY   Lancets (ACCU-CHEK SOFT TOUCH) lancets Use to check blood sugars twice day   losartan (COZAAR) 100 MG tablet Take 1 tablet (100 mg total) by mouth daily.   sodium chloride (OCEAN) 0.65 %  SOLN nasal spray Place 1 spray into both nostrils as needed for congestion.   traMADol (ULTRAM) 50 MG tablet Take 1 tablet (50 mg total) by mouth daily as needed.     Allergies:   Benazepril, Lopressor [metoprolol], Adhesive [tape], Morphine, Nifedipine, and Piroxicam   Past Medical History:  Diagnosis Date   Allergic rhinitis    Allergy    Anxiety    and panic attacks;pt states that she is claustrophobic   Asthma    Atrial fibrillation ([place])    Used to take Coumadin-instructed to stop   Cataract    bilateral surgery to remove   Chronic back pain    hx buldging disc   Colon cancer ([place]) 2007   s/p surgery and chemo   Constipation    takes Colace prn   Diabetes mellitus    glimepiride   DJD (degenerative joint disease)    Fibromyalgia    but doesn't take any meds   Gastric ulcer    GERD (gastroesophageal reflux disease)    uses Tums prn   Gout    takes Colchicine prn   History of blood clots    right lung in early 90's   History of colon polyps    History of migraine headaches    last migraine 43yr ago;Pt states she does get sinus headaches   History of PSVT (paroxysmal  supraventricular tachycardia) no current problems   History of shingles    HLD (hyperlipidemia)    diet control - no meds   Hypertension    takes Micardis and Diltiazem daily   Osteoarthritis    Peptic ulcer disease    Peripheral edema    takes Furosemide every other day   Sleep apnea    doesn't use CPAP    Past Surgical History:  Procedure Laterality Date   ANKLE SURGERY     tumor-benign removed left ankle   ANTERIOR CERVICAL DECOMP/DISCECTOMY FUSION  01/12/2011   Procedure: ANTERIOR CERVICAL DECOMPRESSION/DISCECTOMY FUSION 2 LEVELS;  Surgeon: HCooper RenderPool;  [facility];  Service: Neurosurgery;  Laterality: Bilateral;  Cervical five-six, six-seven anterior cervical discectomy and fusion with allograft and plating   ANTERIOR CERVICAL DECOMP/DISCECTOMY FUSION  06/11/2011   Procedure: ANTERIOR CERVICAL DECOMPRESSION/DISCECTOMY FUSION 1 LEVEL/HARDWARE REMOVAL;  Surgeon: HCharlie Pitter MD;  [facility];  Service: Neurosurgery;  Laterality: N/A;  Cervical four-five anterior cervical decompression fusion with allograft and plating; Removal of Cervical five to seven plate   bilateral cataracts removed     caesarean section  1966/69/72   x 3   CARDIAC CATHETERIZATION  90's and 2005   CARPAL TUNNEL RELEASE Left 11/2013   cataract surgery  2010   COLECTOMY  2007   colon cancer   COLONOSCOPY     DILATION AND CURETTAGE OF UTERUS  02-19-11   & POLYP REMOVAL   GANGLION CYST EXCISION  > [place]   removed from left pointer finger   HEEL SPUR SURGERY Right 1992   x 2   HYSTEROSCOPY WITH D & C  02/19/2011   Procedure: DILATATION AND CURETTAGE /HYSTEROSCOPY;  Surgeon: TiAnastasio AuerbachMD;  [facility];  Service: Gynecology;  Laterality: N/A;  requests one hour   JOINT REPLACEMENT Left    knee   knee arthrosocpy     bil;couple of years before knee replacement   KNEE SURGERY     left TKA   LEFT HEART CATH AND CORONARY ANGIOGRAPHY N/A 04/21/2020   Procedure: LEFT  HEART CATH AND CORONARY ANGIOGRAPHY;  Surgeon: Lorretta Harp, MD;  Location: [facility];  Service: Cardiovascular;  Laterality: N/A;   [place] SURGERY  2008   port a cath placed  2008   port a cath removed  2008   pyelonidal cystectomy     at age 64   SHOULDER ARTHROSCOPY WITH SUBACROMIAL DECOMPRESSION AND OPEN ROTATOR C Right 03/16/2013   Procedure: RIGHT SHOULDER ARTHROSCOPY WITH SUBACROMIAL DECOMPRESSION AND OPEN ROTATOR CUFF REPAIR, OPEN DISTAL CLAVICLE  RESECTION POSSIBLE BICEP TENODESIS ;  Surgeon: Augustin Schooling, MD;  Location: [facility];  Service: Orthopedics;  Laterality: Right;   TRIGGER FINGER RELEASE Left    thumb, tendonitis, tumor removed   TUBAL LIGATION  1972     Social History:  The patient  reports that she quit smoking about 29 years ago. Her smoking use included cigarettes. She has a 45.00 pack-year smoking history. She has never used smokeless tobacco. She reports that she does not drink alcohol and does not use drugs.   Family History:  The patient's family history includes Bladder Cancer in her brother; Coronary artery disease in an other family member; Heart attack in her mother; Heart disease in her father; Hypertension in her mother and sister; Kidney cancer in her sister; Liver cancer in her brother; Stroke in her mother.    ROS:  Please see the history of present illness. All other systems are reviewed and  Negative to the above problem except as noted.    PHYSICAL EXAM: VS:  BP (!) 136/98   Pulse 80   Ht 5' 3"  (1.6 m)   Wt 192 lb 12.8 oz (87.5 kg)   LMP 12/30/2000   SpO2 96%   BMI 34.15 kg/m   GEN: Well nourished, well developed, in no acute distress  HEENT: normal  Neck: no JVD, Cardiac: RRR; Gi II/VI systolic murmur LSB to base  No LE  edema  Chest  Tender in sub xiphoid region   Respiratory:  clear to auscultation bilaterally,  GI: soft, nontender, nondistended, + BS  No hepatomegaly  MS: no deformity Moving all extremities   Skin:  warm and dry, no rash Neuro:  Strength and sensation are intact Psych: euthymic mood, full affect   EKG   NOt done    Lipid Panel    Component Value Date/Time   CHOL 164 10/17/2020 1108   TRIG 172 (H) 10/17/2020 1108   HDL 72 10/17/2020 1108   CHOLHDL 2.3 10/17/2020 1108   CHOLHDL 4 02/04/2020 0926   VLDL 38.0 02/04/2020 0926   LDLCALC 64 10/17/2020 1108   LDLDIRECT 123.0 08/03/2017 1031      Wt Readings from Last 3 Encounters:  01/02/21 192 lb 12.8 oz (87.5 kg)  10/07/20 190 lb 6.4 oz (86.4 kg)  10/02/20 194 lb 12.8 oz (88.4 kg)      ASSESSMENT AND PLAN:  1.  PAF   Pt without significant symtpoms   Follow   Pt refuses anticoagulation  2  HTN  BP is labile   High and very low   I have asked her to keep log of bps, especially when low   Switch losartan to 50 bid    Recomm she come back  in spring with BP readings and BP cuff    3 AV Very mild AS   Follow      4   HL   Levels were excellent   Stopped lipitor however   WIlll follow  F/U in the spring wit hcuff and readings      Current medicines are reviewed at length with the patient today.  The patient does not have concerns regarding medicines.  Signed, Dorris Carnes, MD  01/02/2021 11:59 AM    [address], [place], [place]  [address] Phone: [phone]; Fax: [phone]

## 2021-01-02 ENCOUNTER — Other Ambulatory Visit: Payer: Self-pay

## 2021-01-02 ENCOUNTER — Encounter: Payer: Self-pay | Admitting: Internal Medicine

## 2021-01-02 ENCOUNTER — Ambulatory Visit: Payer: Medicare HMO | Admitting: Internal Medicine

## 2021-01-02 VITALS — BP 136/98 | HR 80 | Ht 63.0 in | Wt 192.8 lb

## 2021-01-02 DIAGNOSIS — I10 Essential (primary) hypertension: Secondary | ICD-10-CM | POA: Diagnosis not present

## 2021-01-02 MED ORDER — LOSARTAN POTASSIUM 100 MG PO TABS: 50.0000 mg | ORAL_TABLET | Freq: Two times a day (BID) | ORAL | 2 refills | Status: DC

## 2021-01-02 NOTE — Patient Instructions (Signed)
  Medication Instructions:   START TAKING 50 MG LOSARTAN IN MORNING AND TAKE 50 MG LOSARTAN IN THE EVENING   *If you need a refill on your cardiac medications before your next appointment, please call your pharmacy*   Lab Work: NONE ORDERED  TODAY    If you have labs (blood work) drawn today and your tests are completely normal, you will receive your results only by: MyChart Message (if you have MyChart) OR A paper copy in the mail If you have any lab test that is abnormal or we need to change your treatment, we will call you to review the results.   Testing/Procedures: NONE ORDERED  TODAY   Follow-Up: At [facility], you and your health needs are our priority.  As part of our continuing mission to provide you with exceptional heart care, we have created designated Provider Care Teams.  These Care Teams include your primary Cardiologist (physician) and Advanced Practice Providers (APPs -  Physician Assistants and Nurse Practitioners) who all work together to provide you with the care you need, when you need it.  We recommend signing up for the patient portal called "MyChart".  Sign up information is provided on this After Visit Summary.  MyChart is used to connect with patients for Virtual Visits (Telemedicine).  Patients are able to view lab/test results, encounter notes, upcoming appointments, etc.  Non-urgent messages can be sent to your provider as well.   To learn more about what you can do with MyChart, go to [email or web address].    Your next appointment:  BRING BLOOD PRESSURE CUFF TO VISIT  4 -5 month(s)  The format for your next appointment:   In Person  Provider:   Dorris Carnes, MD    Other Instructions

## 2021-02-12 ENCOUNTER — Other Ambulatory Visit: Payer: Self-pay | Admitting: Internal Medicine

## 2021-03-03 ENCOUNTER — Telehealth: Payer: Self-pay | Admitting: Internal Medicine

## 2021-03-03 NOTE — Telephone Encounter (Signed)
I spoke with patient. She reports she had 2 episodes of afib in December. Had an episode also on January 5-6. Lasted 15 hours. Was feeling stressed and had shoulder pain prior to this episode.  Since this episode she has been in SR but still feels flutters at times. Some shortness of breath with afib and skipping but also reports she has asthma. Current heart rate is 73 and BP is 140/69. She is asking if medications need to be adjusted.

## 2021-03-03 NOTE — Telephone Encounter (Signed)
Patient c/o Palpitations:  High priority if patient c/o lightheadedness, shortness of breath, or chest pain  How long have you had palpitations/irregular HR/ Afib? Are you having the symptoms now? Yes   Are you currently experiencing lightheadedness, SOB or CP? No   Do you have a history of afib (atrial fibrillation) or irregular heart rhythm? Yes   Have you checked your BP or HR? (document readings if available): No   Are you experiencing any other symptoms? No

## 2021-03-11 NOTE — Telephone Encounter (Signed)
Left a message for the pt to call back.  

## 2021-03-11 NOTE — Telephone Encounter (Signed)
Tried to  call pt    I would recomm she increase Flecanide to 75 mg bid  Follow  Will need EKG in 10 days    Keep on other meds

## 2021-03-12 ENCOUNTER — Telehealth: Payer: Self-pay | Admitting: Internal Medicine

## 2021-03-12 MED ORDER — TRUE METRIX BLOOD GLUCOSE TEST VI STRP: ORAL_STRIP | 12 refills | Status: DC

## 2021-03-12 MED ORDER — TRUE METRIX METER W/DEVICE KIT: 1.0000 | PACK | Freq: Two times a day (BID) | 0 refills | Status: DC

## 2021-03-12 NOTE — Telephone Encounter (Signed)
New meter has been sent to the pt's pharmacy

## 2021-03-12 NOTE — Telephone Encounter (Signed)
1.Medication Requested: True Metrix Meter & supplies  2. Pharmacy (Name, [place], [facility]):  [address], [place]  Phone:  [phone] Fax:  [phone]   3. On Med List: yes  4. Last Visit with PCP: 08.16.22  5. Next visit date with PCP: n/a   Agent: Please be advised that RX refills may take up to 3 business days. We ask that you follow-up with your pharmacy.

## 2021-03-13 ENCOUNTER — Other Ambulatory Visit: Payer: Self-pay

## 2021-03-13 NOTE — Telephone Encounter (Signed)
I spoke with the pt and she reports that she has been having episodes of "Afib" for a few months and lasting several hours... the longest was 1/5-02/27/21 lasting for 15 hours and her rate was up to 140.... she has dizziness and SOB associated with it.  She has not had any episodes since then but she is not on anticoagulation... she reports that she cannot tolerate... she is also not on the Flecainide anymore since this past Fall since it made her sick to her stomach.   I strongly urged her to call EMS if she has another episode with the palpitations and rapid rate not to drive herself to the ED... we also went over CVA symptoms..   I made her the next available DOD appt for this Monday 03/16/21.   She will call the MD on call or call EMS if any returning or worsening symptoms.

## 2021-03-16 ENCOUNTER — Ambulatory Visit: Payer: Medicare HMO | Admitting: Cardiology

## 2021-03-16 ENCOUNTER — Encounter: Payer: Self-pay | Admitting: Cardiology

## 2021-03-16 ENCOUNTER — Other Ambulatory Visit: Payer: Self-pay

## 2021-03-16 VITALS — BP 160/82 | HR 74 | Ht 63.0 in | Wt 195.4 lb

## 2021-03-16 DIAGNOSIS — I10 Essential (primary) hypertension: Secondary | ICD-10-CM

## 2021-03-16 DIAGNOSIS — E785 Hyperlipidemia, unspecified: Secondary | ICD-10-CM

## 2021-03-16 DIAGNOSIS — I480 Paroxysmal atrial fibrillation: Secondary | ICD-10-CM | POA: Diagnosis not present

## 2021-03-16 MED ORDER — APIXABAN 5 MG PO TABS: 5.0000 mg | ORAL_TABLET | Freq: Two times a day (BID) | ORAL | 3 refills | Status: DC

## 2021-03-16 NOTE — Progress Notes (Signed)
Cardiology Office Note:    Date:  03/16/2021   ID:  [name], DOB December 23, 1940, MRN [id number]  PCP:  Hoyt Koch, MD  Cardiologist:  Dorris Carnes, MD  Electrophysiologist:  None   Referring MD: Hoyt Koch, *   Chief Complaint  Patient presents with   Atrial Fibrillation    History of Present Illness:    [name] is a 81 y.o. female with a hx of paroxysmal atrial fibrillation who has declined anticoagulation, aortic stenosis, T2DM, hypertension, hyperlipidemia, asthma, OSA, remote PE, PUD who presents for follow-up.  She follows with Dr. Harrington Challenger.  She was admitted in February 2022 with A. fib with RVR and troponin elevation.  Echocardiogram 03/2020 showed normal biventricular function, aortic valve sclerosis/calcification but no stenosis.  LHC on 04/21/2020 showed normal coronary arteries.  She was started on flecainide 50 mg twice daily to maintain sinus rhythm as well as Eliquis.  However she did not take either medication.  She was last seen by Dr. Harrington Challenger 12/2020.  She reports she had an A. fib episode for 15 hours from 1/5 through 02/27/2021.  Heart rate up to 130s.  She was asymptomatic during this episode.  She is taking diltiazem 360 mg daily.  States that she has not tolerated warfarin in the past, states she felt very fatigued on the medication.  She is willing to try Eliquis.  Reports chronic dyspnea, denies any chest pain.    Past Medical History:  Diagnosis Date   Allergic rhinitis    Allergy    Anxiety    and panic attacks;pt states that she is claustrophobic   Asthma    Atrial fibrillation ([place])    Used to take Coumadin-instructed to stop   Cataract    bilateral surgery to remove   Chronic back pain    hx buldging disc   Colon cancer ([place]) 2007   s/p surgery and chemo   Constipation    takes Colace prn   Diabetes mellitus    glimepiride   DJD (degenerative joint disease)    Fibromyalgia    but doesn't take any meds   Gastric ulcer    GERD  (gastroesophageal reflux disease)    uses Tums prn   Gout    takes Colchicine prn   History of blood clots    right lung in early 90's   History of colon polyps    History of migraine headaches    last migraine 87yr ago;Pt states she does get sinus headaches   History of PSVT (paroxysmal supraventricular tachycardia) no current problems   History of shingles    HLD (hyperlipidemia)    diet control - no meds   Hypertension    takes Micardis and Diltiazem daily   Osteoarthritis    Peptic ulcer disease    Peripheral edema    takes Furosemide every other day   Sleep apnea    doesn't use CPAP    Past Surgical History:  Procedure Laterality Date   ANKLE SURGERY     tumor-benign removed left ankle   ANTERIOR CERVICAL DECOMP/DISCECTOMY FUSION  01/12/2011   Procedure: ANTERIOR CERVICAL DECOMPRESSION/DISCECTOMY FUSION 2 LEVELS;  Surgeon: HCooper RenderPool;  [facility];  Service: Neurosurgery;  Laterality: Bilateral;  Cervical five-six, six-seven anterior cervical discectomy and fusion with allograft and plating   ANTERIOR CERVICAL DECOMP/DISCECTOMY FUSION  06/11/2011   Procedure: ANTERIOR CERVICAL DECOMPRESSION/DISCECTOMY FUSION 1 LEVEL/HARDWARE REMOVAL;  Surgeon: HCharlie Pitter MD;  [facility];  Service: Neurosurgery;  Laterality: N/A;  Cervical four-five anterior cervical decompression fusion with allograft and plating; Removal of Cervical five to seven plate   bilateral cataracts removed     caesarean section  1966/69/72   x 3   CARDIAC CATHETERIZATION  90's and 2005   CARPAL TUNNEL RELEASE Left 11/2013   cataract surgery  2010   COLECTOMY  2007   colon cancer   COLONOSCOPY     DILATION AND CURETTAGE OF UTERUS  02-19-11   & POLYP REMOVAL   GANGLION CYST EXCISION  > 42yr ago   removed from left pointer finger   HEEL SPUR SURGERY Right 1992   x 2   HYSTEROSCOPY WITH D & C  02/19/2011   Procedure: DILATATION AND CURETTAGE /HYSTEROSCOPY;  Surgeon: TAnastasio Auerbach MD;  [facility];  Service: Gynecology;  Laterality: N/A;  requests one hour   JOINT REPLACEMENT Left    knee   knee arthrosocpy     bil;couple of years before knee replacement   KNEE SURGERY     left TKA   LEFT HEART CATH AND CORONARY ANGIOGRAPHY N/A 04/21/2020   Procedure: LEFT HEART CATH AND CORONARY ANGIOGRAPHY;  Surgeon: BLorretta Harp MD;  [facility];  Service: Cardiovascular;  Laterality: N/A;   [facility]  [facility]   port a cath placed  2008   port a cath removed  2008   pyelonidal cystectomy     at age 81  SHOULDER ARTHROSCOPY WITH SUBACROMIAL DECOMPRESSION AND OPEN ROTATOR C Right 03/16/2013   Procedure: RIGHT SHOULDER ARTHROSCOPY WITH SUBACROMIAL DECOMPRESSION AND OPEN ROTATOR CUFF REPAIR, OPEN DISTAL CLAVICLE  RESECTION POSSIBLE BICEP TENODESIS ;  Surgeon: SAugustin Schooling MD;  [facility]  Service: Orthopedics;  Laterality: Right;   TRIGGER FINGER RELEASE Left    thumb, tendonitis, tumor removed   TUBAL LIGATION  1972    Current Medications: Current Meds  Medication Sig   albuterol (PROVENTIL HFA;VENTOLIN HFA) 108 (90 Base) MCG/ACT inhaler Inhale 2 puffs into the lungs every 6 (six) hours as needed for wheezing or shortness of breath.   apixaban (ELIQUIS) 5 MG TABS tablet Take 1 tablet (5 mg total) by mouth 2 (two) times daily.   Blood Glucose Monitoring Suppl (TRUE METRIX METER) w/Device KIT 1 each by Does not apply route in the morning and at bedtime.   Colchicine (MITIGARE) 0.6 MG CAPS Take 1 tablet by mouth 2 (two) times daily.   diltiazem (CARDIZEM CD) 120 MG 24 hr capsule TAKE 1 CAP DAILY. TAKE W/DILTIAZEM 240MG FOR 360MG/DAY. MAY TAKE 1 ADDITIONAL CAP AS NEEDED FOR BREAKTHROUGH AFIB   diltiazem (CARDIZEM CD) 240 MG 24 hr capsule TAKE 1 CAPSULE BY MOUTH DAILY. TAKE IN ADDITION TO DILTIAZEM 120 FOR TOTAL OF 360 MG DAILY   glucose blood (TRUE METRIX BLOOD GLUCOSE TEST) test strip Use as instructed   hydrocortisone cream 1 %  Apply 1 application topically See admin instructions. 1 application under breast after showers and 1 application as needed for itching   Lancets (ACCU-CHEK SOFT TOUCH) lancets Use to check blood sugars twice day   losartan (COZAAR) 100 MG tablet Take 0.5 tablets (50 mg total) by mouth in the morning and at bedtime.   sodium chloride (OCEAN) 0.65 % SOLN nasal spray Place 1 spray into both nostrils as needed for congestion.   traMADol (ULTRAM) 50 MG tablet Take 1 tablet (50 mg total) by mouth daily as  needed.   [DISCONTINUED] aspirin 325 MG tablet Take 325 mg by mouth daily as needed. Only taking while afib episode   [DISCONTINUED] aspirin EC 81 MG tablet Take 1 tablet (81 mg total) by mouth daily. Swallow whole.   [DISCONTINUED] atorvastatin (LIPITOR) 20 MG tablet Take 1 tablet (20 mg total) by mouth daily.   [DISCONTINUED] famotidine (PEPCID) 20 MG tablet TAKE 1 TABLET BY MOUTH TWICE A DAY   [DISCONTINUED] polyvinyl alcohol (LIQUIFILM TEARS) 1.4 % ophthalmic solution Place 1 drop into both eyes as needed for dry eyes.     Allergies:   Benazepril, Lopressor [metoprolol], Adhesive [tape], Morphine, Nifedipine, and Piroxicam   Social History   Socioeconomic History   Marital status: Married    Spouse name: Not on file   Number of children: 3   Years of education: Not on file   Highest education level: Not on file  Occupational History   Occupation: retired  Tobacco Use   Smoking status: Former    Packs/day: 1.50    Years: 30.00    Pack years: 45.00    Types: Cigarettes    Quit date: 02/23/1991    Years since quitting: 30.0   Smokeless tobacco: Never  Vaping Use   Vaping Use: Never used  Substance and Sexual Activity   Alcohol use: No    Alcohol/week: 0.0 standard drinks   Drug use: No   Sexual activity: Yes    Partners: Male    Birth control/protection: Post-menopausal  Other Topics Concern   Not on file  Social History Narrative   Regular exercise- no.   Social  Determinants of Health   Financial Resource Strain: Not on file  Food Insecurity: Not on file  Transportation Needs: Not on file  Physical Activity: Not on file  Stress: Not on file  Social Connections: Not on file     Family History: The patient's family history includes Bladder Cancer in her brother; Coronary artery disease in an other family member; Heart attack in her mother; Heart disease in her father; Hypertension in her mother and sister; Kidney cancer in her sister; Liver cancer in her brother; Stroke in her mother. There is no history of Anesthesia problems, Hypotension, Malignant hyperthermia, Pseudochol deficiency, Stomach cancer, or Rectal cancer.  ROS:   Please see the history of present illness.     All other systems reviewed and are negative.  EKGs/Labs/Other Studies Reviewed:    The following studies were reviewed today:   EKG:   03/16/21: NSR, rate 74, LAD  Recent Labs: 10/02/2020: BUN 31; Creatinine, Ser 1.39; Hemoglobin 14.5; Platelets 273; Potassium 4.6; Sodium 141 10/07/2020: TSH 2.38  Recent Lipid Panel    Component Value Date/Time   CHOL 164 10/17/2020 1108   TRIG 172 (H) 10/17/2020 1108   HDL 72 10/17/2020 1108   CHOLHDL 2.3 10/17/2020 1108   CHOLHDL 4 02/04/2020 0926   VLDL 38.0 02/04/2020 0926   LDLCALC 64 10/17/2020 1108   LDLDIRECT 123.0 08/03/2017 1031    Physical Exam:    VS:  BP (!) 160/82    Pulse 74    Ht 5' 3"  (1.6 m)    Wt 195 lb 6.4 oz (88.6 kg)    LMP 12/30/2000    SpO2 98%    BMI 34.61 kg/m     Wt Readings from Last 3 Encounters:  03/16/21 195 lb 6.4 oz (88.6 kg)  01/02/21 192 lb 12.8 oz (87.5 kg)  10/07/20 190 lb 6.4 oz (86.4 kg)  GEN:  Well nourished, well developed in no acute distress HEENT: Normal NECK: No JVD; No carotid bruits LYMPHATICS: No lymphadenopathy CARDIAC: RRR, 2 out of 6 systolic murmur RESPIRATORY:  Clear to auscultation without rales, wheezing or rhonchi  ABDOMEN: Soft, non-tender,  non-distended MUSCULOSKELETAL:  No edema; No deformity  SKIN: Warm and dry NEUROLOGIC:  Alert and oriented x 3 PSYCHIATRIC:  Normal affect   ASSESSMENT:    1. PAF (paroxysmal atrial fibrillation) ([place])   2. Essential hypertension   3. Hyperlipidemia, unspecified hyperlipidemia type    PLAN:    Paroxysmal atrial fibrillation: CHA2DS2-VASc score 5 (hypertension, age x2, DM, female).  She has declined anticoagulation -She is agreeable to starting Eliquis.  We will start 5 mg twice daily and stop aspirin. -Continue diltiazem 360 mg daily -Previously did not tolerate flecainide due to nausea.  She is very symptomatic during A. fib episodes and would like to prevent recurrence.  Will refer to EP to evaluate for ablation versus antiarrhythmic  Hyperlipidemia: On atorvastatin 20 mg daily but reports has not been taking  Hypertension: On diltiazem 360 mg daily and losartan 50 mg daily    Medication Adjustments/Labs and Tests Ordered: Current medicines are reviewed at length with the patient today.  Concerns regarding medicines are outlined above.  Orders Placed This Encounter  Procedures   Ambulatory referral to Cardiac Electrophysiology   EKG 12-Lead   Meds ordered this encounter  Medications   apixaban (ELIQUIS) 5 MG TABS tablet    Sig: Take 1 tablet (5 mg total) by mouth 2 (two) times daily.    Dispense:  60 tablet    Refill:  3    Patient Instructions  Medication Instructions:   START: ELIQUIS 64m TWICE DAILY   SAMPLES GIVEN TODAY   STOP: ASPIRIN    *If you need a refill on your cardiac medications before your next appointment, please call your pharmacy*  Follow-Up: [facility] and your [facility] needs are our [facility].  As part of our continuing mission to provide you with exceptional heart care, we have created designated Provider Care Teams.  These Care Teams include your primary Cardiologist (physician) and Advanced Practice Providers (APPs -  Physician  Assistants and Nurse Practitioners) who all work together to provide you with the care you need, when you need it.  Your next appointment:   AS SCHEDULED   The format for your next appointment:   In Person  Provider:   PDorris Carnes MD     Other Instructions [place]ERRAL TO [place]    Signed, CDonato Heinz MD  03/16/2021 2:03 PM    [place]

## 2021-03-16 NOTE — Patient Instructions (Addendum)
Medication Instructions:   START: ELIQUIS 5mg  TWICE DAILY   SAMPLES GIVEN TODAY   STOP: ASPIRIN    *If you need a refill on your cardiac medications before your next appointment, please call your pharmacy*  Follow-Up: At [facility], you and your health needs are our priority.  As part of our continuing mission to provide you with exceptional heart care, we have created designated Provider Care Teams.  These Care Teams include your primary Cardiologist (physician) and Advanced Practice Providers (APPs -  Physician Assistants and Nurse Practitioners) who all work together to provide you with the care you need, when you need it.  Your next appointment:   AS SCHEDULED   The format for your next appointment:   In Person  Provider:   Dorris Carnes, MD     Other Instructions REFERRAL TO [place]

## 2021-03-24 ENCOUNTER — Ambulatory Visit (INDEPENDENT_AMBULATORY_CARE_PROVIDER_SITE_OTHER): Payer: Medicare HMO

## 2021-03-24 ENCOUNTER — Ambulatory Visit (INDEPENDENT_AMBULATORY_CARE_PROVIDER_SITE_OTHER): Payer: Medicare HMO | Admitting: Internal Medicine

## 2021-03-24 ENCOUNTER — Encounter: Payer: Self-pay | Admitting: Internal Medicine

## 2021-03-24 ENCOUNTER — Other Ambulatory Visit: Payer: Self-pay

## 2021-03-24 VITALS — BP 152/78 | HR 62 | Temp 98.3°F | Resp 18 | Ht 63.0 in | Wt 193.6 lb

## 2021-03-24 DIAGNOSIS — R1032 Left lower quadrant pain: Secondary | ICD-10-CM

## 2021-03-24 DIAGNOSIS — M25552 Pain in left hip: Secondary | ICD-10-CM

## 2021-03-24 DIAGNOSIS — M545 Low back pain, unspecified: Secondary | ICD-10-CM | POA: Diagnosis not present

## 2021-03-24 DIAGNOSIS — E1169 Type 2 diabetes mellitus with other specified complication: Secondary | ICD-10-CM

## 2021-03-24 DIAGNOSIS — E785 Hyperlipidemia, unspecified: Secondary | ICD-10-CM | POA: Diagnosis not present

## 2021-03-24 DIAGNOSIS — M47816 Spondylosis without myelopathy or radiculopathy, lumbar region: Secondary | ICD-10-CM | POA: Diagnosis not present

## 2021-03-24 LAB — LIPID PANEL
Cholesterol: 218 mg/dL — ABNORMAL HIGH (ref 0–200)
HDL: 63.5 mg/dL (ref >39.00)
NonHDL: 154.99
Total CHOL/HDL Ratio: 3
Triglycerides: 244 mg/dL — ABNORMAL HIGH (ref 0.0–149.0)
VLDL: 48.8 mg/dL — ABNORMAL HIGH (ref 0.0–40.0)

## 2021-03-24 LAB — LIPASE: Lipase: 30 U/L (ref 11.0–59.0)

## 2021-03-24 LAB — COMPREHENSIVE METABOLIC PANEL
ALT: 9 U/L (ref 0–35)
AST: 14 U/L (ref 0–37)
Albumin: 4.1 g/dL (ref 3.5–5.2)
Alkaline Phosphatase: 79 U/L (ref 39–117)
BUN: 21 mg/dL (ref 6–23)
CO2: 27 mEq/L (ref 19–32)
Calcium: 9.2 mg/dL (ref 8.4–10.5)
Chloride: 106 mEq/L (ref 96–112)
Creatinine, Ser: 1.14 mg/dL (ref 0.40–1.20)
GFR: 45.97 mL/min — ABNORMAL LOW (ref >60.00)
Glucose, Bld: 108 mg/dL — ABNORMAL HIGH (ref 70–99)
Potassium: 3.5 mEq/L (ref 3.5–5.1)
Sodium: 140 mEq/L (ref 135–145)
Total Bilirubin: 0.4 mg/dL (ref 0.2–1.2)
Total Protein: 6.4 g/dL (ref 6.0–8.3)

## 2021-03-24 LAB — LDL CHOLESTEROL, DIRECT: Direct LDL: 128 mg/dL

## 2021-03-24 LAB — CBC
HCT: 40 % (ref 36.0–46.0)
Hemoglobin: 13.2 g/dL (ref 12.0–15.0)
MCHC: 33.1 g/dL (ref 30.0–36.0)
MCV: 88.9 fl (ref 78.0–100.0)
Platelets: 234 10*3/uL (ref 150.0–400.0)
RBC: 4.5 Mil/uL (ref 3.87–5.11)
RDW: 14 % (ref 11.5–15.5)
WBC: 7.2 10*3/uL (ref 4.0–10.5)

## 2021-03-24 LAB — HEMOGLOBIN A1C: Hgb A1c MFr Bld: 6.3 % (ref 4.6–6.5)

## 2021-03-24 LAB — MICROALBUMIN / CREATININE URINE RATIO
Creatinine,U: 276.8 mg/dL
Microalb Creat Ratio: 3.9 mg/g (ref 0.0–30.0)
Microalb, Ur: 10.9 mg/dL — ABNORMAL HIGH (ref 0.0–1.9)

## 2021-03-24 NOTE — Patient Instructions (Signed)
We will check the labs and x-ray today.   If no cause for the pain we can get a CT scan of the stomach.

## 2021-03-24 NOTE — Progress Notes (Signed)
° °  Subjective:   Patient ID: [name], female    DOB: Oct 10, 1940, 81 y.o.   MRN: [id number]  Abdominal Pain Pertinent negatives include no constipation, diarrhea, nausea or vomiting.  The patient is a 81 YO female coming in for pain left lower quadrant. Started 2 weeks ago. Has known left and right inguinal hernias on prior CT. No known injury. Pain with sitting or walking. No triggers. Has not taken anything for it. Has tramadol at home but this causes constipation. Denies diarrhea, constipation, nausea, vomiting. No pain or change in pain with eating. Eating and drinking normally. Overall improved from last week but still daily and persistent.   Review of Systems  Constitutional: Negative.   HENT: Negative.    Eyes: Negative.   Respiratory:  Negative for cough, chest tightness and shortness of breath.   Cardiovascular:  Negative for chest pain, palpitations and leg swelling.  Gastrointestinal:  Positive for abdominal pain. Negative for abdominal distention, constipation, diarrhea, nausea and vomiting.  Musculoskeletal: Negative.   Skin: Negative.   Neurological: Negative.   Psychiatric/Behavioral: Negative.     Objective:  Physical Exam Constitutional:      Appearance: She is well-developed.  HENT:     Head: Normocephalic and atraumatic.  Cardiovascular:     Rate and Rhythm: Normal rate and regular rhythm.  Pulmonary:     Effort: Pulmonary effort is normal. No respiratory distress.     Breath sounds: Normal breath sounds. No wheezing or rales.  Abdominal:     General: Bowel sounds are normal. There is no distension.     Palpations: Abdomen is soft.     Tenderness: There is no abdominal tenderness. There is no rebound.     Comments: Pain left lower quadrant/left inguinal no worsening tenderness to palpation, no rebound or guarding and not referred from other sites on the abdomen.   Musculoskeletal:     Cervical back: Normal range of motion.  Skin:    General: Skin is warm  and dry.  Neurological:     Mental Status: She is alert and oriented to person, place, and time.     Coordination: Coordination normal.    Vitals:   03/24/21 1335  BP: (!) 152/78  Pulse: 62  Resp: 18  Temp: 98.3 F (36.8 C)  TempSrc: Oral  SpO2: 98%  Weight: 193 lb 9.6 oz (87.8 kg)  Height: 5\' 3"  (1.6 m)    This visit occurred during the SARS-CoV-2 public health emergency.  Safety protocols were in place, including screening questions prior to the visit, additional usage of staff PPE, and extensive cleaning of exam room while observing appropriate contact time as indicated for disinfecting solutions.   Assessment & Plan:

## 2021-03-24 NOTE — Assessment & Plan Note (Signed)
She has stopped taking statins. Checking lipid panel and likely will need to start statin if willing.

## 2021-03-24 NOTE — Assessment & Plan Note (Signed)
Suspect hip or lumbar could be etiology of her LLQ/inguinal pain. She does have known inguinal hernia so if no arthritis detected on x-ray lumbar and hip which are ordered today could consider CT scan.

## 2021-03-24 NOTE — Assessment & Plan Note (Signed)
Foot exam, HgA1c, lipid panel, microalbumin to creatinine ratio and CMP ordered today. Adjust as needed she is diet controlled currently.

## 2021-03-24 NOTE — Assessment & Plan Note (Signed)
Known left inguinal hernia which could contribute. She also has known L3-5 disease which could be worse. She does not have signs of GI infection. Checking CBC, CMP, lipase to rule out metabolic etiology. Checking x-ray hip and lumbar to rule out progression of known arthritis. If no clear etiology reasonable to do repeat CT abdomen/pelvis which could be helpful in planning for hernia repair if this is felt to be causative.

## 2021-04-09 ENCOUNTER — Telehealth: Payer: Self-pay

## 2021-04-09 NOTE — Telephone Encounter (Signed)
See below

## 2021-04-09 NOTE — Telephone Encounter (Signed)
Pt called in for results from 1/31. I advised pt of Dr. Charlynne Cousins result note stating sugar levels are good. The cholesterol is high and you should be on a cholesterol medicine if willing. The other labs are normal.   Pt agreed to getting a cholesterol medicine sent to [place], [place]

## 2021-04-14 ENCOUNTER — Other Ambulatory Visit: Payer: Self-pay | Admitting: General Practice

## 2021-04-15 MED ORDER — PRAVASTATIN SODIUM 20 MG PO TABS: 20.0000 mg | ORAL_TABLET | Freq: Every day | ORAL | 3 refills | Status: DC

## 2021-04-27 ENCOUNTER — Other Ambulatory Visit: Payer: Self-pay

## 2021-04-27 ENCOUNTER — Ambulatory Visit (INDEPENDENT_AMBULATORY_CARE_PROVIDER_SITE_OTHER): Payer: Medicare HMO | Admitting: Internal Medicine

## 2021-04-27 ENCOUNTER — Encounter: Payer: Self-pay | Admitting: Internal Medicine

## 2021-04-27 DIAGNOSIS — L237 Allergic contact dermatitis due to plants, except food: Secondary | ICD-10-CM | POA: Diagnosis not present

## 2021-04-27 MED ORDER — TRIAMCINOLONE ACETONIDE 0.5 % EX OINT: 1.0000 "application " | TOPICAL_OINTMENT | Freq: Two times a day (BID) | CUTANEOUS | 0 refills | Status: DC

## 2021-04-27 MED ORDER — METHYLPREDNISOLONE ACETATE 80 MG/ML IJ SUSP
80.0000 mg | Freq: Once | INTRAMUSCULAR | Status: AC
  Administered 2021-04-27: 80 mg via INTRAMUSCULAR

## 2021-04-27 NOTE — Progress Notes (Signed)
? ? ?Subjective:  ? ? Patient ID: [name], female    DOB: Mar 14, 1940, 81 y.o.   MRN: [id number] ? ?This visit occurred during the SARS-CoV-2 public health emergency.  Safety protocols were in place, including screening questions prior to the visit, additional usage of staff PPE, and extensive cleaning of exam room while observing appropriate contact time as indicated for disinfecting solutions. ? ? ? ?HPI ?[name] is here for  ?Chief Complaint  ?Patient presents with  ? Poison Ivy  ? ? ?She was doing yard work over the weekend and came in contact with poison ivy/oak and has a rash on bilateral hands, arms and it is spreading.  She has 1 area on her right posterior leg that is also poison ivy.  She has had this many times in the past and can become quite severe.  She wanted to come in quickly to get it under control-she typically has required a steroid injection in the past.  She has used over-the-counter anti-itch, cortisone creams with minimal improvement. ? ? ? ? ?Medications and allergies reviewed with patient and updated if appropriate. ? ?Current Outpatient Medications on File Prior to Visit  ?Medication Sig Dispense Refill  ? albuterol (PROVENTIL HFA;VENTOLIN HFA) 108 (90 Base) MCG/ACT inhaler Inhale 2 puffs into the lungs every 6 (six) hours as needed for wheezing or shortness of breath. 1 Inhaler 1  ? apixaban (ELIQUIS) 5 MG TABS tablet Take 1 tablet (5 mg total) by mouth 2 (two) times daily. 60 tablet 3  ? Blood Glucose Monitoring Suppl (TRUE METRIX METER) w/Device KIT 1 each by Does not apply route in the morning and at bedtime. 1 kit 0  ? Colchicine (MITIGARE) 0.6 MG CAPS Take 1 tablet by mouth 2 (two) times daily. 60 capsule 0  ? diltiazem (CARDIZEM CD) 120 MG 24 hr capsule TAKE 1 CAP DAILY. TAKE W/DILTIAZEM 240MG FOR 360MG/DAY. MAY TAKE 1 ADDITIONAL CAP AS NEEDED FOR BREAKTHROUGH AFIB 180 capsule 0  ? diltiazem (CARDIZEM CD) 240 MG 24 hr capsule TAKE 1 CAPSULE BY MOUTH DAILY. TAKE IN ADDITION TO  DILTIAZEM 120 FOR TOTAL OF 360 MG DAILY 90 capsule 3  ? glucose blood (TRUE METRIX BLOOD GLUCOSE TEST) test strip Use as instructed 100 each 12  ? hydrocortisone cream 1 % Apply 1 application topically See admin instructions. 1 application under breast after showers and ?1 application as needed for itching    ? Lancets (ACCU-CHEK SOFT TOUCH) lancets Use to check blood sugars twice day 300 each 1  ? losartan (COZAAR) 100 MG tablet Take 0.5 tablets (50 mg total) by mouth in the morning and at bedtime. 90 tablet 2  ? pravastatin (PRAVACHOL) 20 MG tablet Take 1 tablet (20 mg total) by mouth daily. 90 tablet 3  ? sodium chloride (OCEAN) 0.65 % SOLN nasal spray Place 1 spray into both nostrils as needed for congestion.    ? traMADol (ULTRAM) 50 MG tablet Take 1 tablet (50 mg total) by mouth daily as needed. 30 tablet 1  ? ?No current facility-administered medications on file prior to visit.  ? ? ?Review of Systems  ?Constitutional:  Negative for fever.  ?HENT:  Negative for sore throat.   ?Respiratory:  Negative for choking, shortness of breath and wheezing.   ?Skin:  Positive for rash.  ? ?   ?Objective:  ? ?Vitals:  ? 04/27/21 1620  ?BP: (!) 148/82  ?Pulse: 71  ?Temp: 98 ?F (36.7 ?C)  ?SpO2: 97%  ? ?BP  Readings from Last 3 Encounters:  ?04/27/21 (!) 148/82  ?03/24/21 (!) 152/78  ?03/16/21 (!) 160/82  ? ?Wt Readings from Last 3 Encounters:  ?04/27/21 193 lb 12.8 oz (87.9 kg)  ?03/24/21 193 lb 9.6 oz (87.8 kg)  ?03/16/21 195 lb 6.4 oz (88.6 kg)  ? ?Body mass index is 34.33 kg/m?. ? ?  ?Physical Exam ?Constitutional:   ?   General: She is not in acute distress. ?   Appearance: Normal appearance. She is not ill-appearing.  ?HENT:  ?   Head: Normocephalic and atraumatic.  ?Pulmonary:  ?   Effort: Pulmonary effort is normal.  ?Skin: ?   General: Skin is warm and dry.  ?   Findings: Rash (blister and papular rash in clusters on b/l hands and arms, no open wounds) present.  ?Neurological:  ?   Mental Status: She is alert.  ? ?    ? ? ? ? ? ?Assessment & Plan:  ? ? ?See Problem List for Assessment and Plan of chronic medical problems.  ? ? ? ? ?

## 2021-04-27 NOTE — Assessment & Plan Note (Signed)
Acute ?Rash is spreading and has had this several times and has always required a steroid injection ?Given severity will give steroid injection today-Depo-Medrol 80 mg IM x1 ?Triamcinolone 0.1% cream twice daily as needed ?She will monitor the rash and if it is not improving may need a course of oral steroids-she will let me know ?

## 2021-04-27 NOTE — Patient Instructions (Addendum)
? ? ? ?  Your had a steroid injection today. ? ? ? ?A steroid cream was sent to your pharmacy.  ? ? ? ?Return if symptoms worsen or fail to improve. ? ?

## 2021-04-28 ENCOUNTER — Institutional Professional Consult (permissible substitution): Payer: Medicare HMO | Admitting: Cardiology

## 2021-05-12 ENCOUNTER — Other Ambulatory Visit: Payer: Self-pay | Admitting: Internal Medicine

## 2021-05-12 ENCOUNTER — Ambulatory Visit: Payer: Medicare HMO | Admitting: Internal Medicine

## 2021-07-10 ENCOUNTER — Ambulatory Visit (INDEPENDENT_AMBULATORY_CARE_PROVIDER_SITE_OTHER): Payer: Medicare HMO

## 2021-07-10 ENCOUNTER — Telehealth: Payer: Self-pay

## 2021-07-10 DIAGNOSIS — Z0000 Encounter for general adult medical examination without abnormal findings: Secondary | ICD-10-CM

## 2021-07-10 NOTE — Telephone Encounter (Signed)
Pt is requesting a refill on:  Promethazine HCl (PHENERGAN PO)  Medication is on the Discontinued medication list  Pharmacy [id number] [place] [place] 03/24/21 ROV 07/16/21

## 2021-07-10 NOTE — Patient Instructions (Signed)
[name] , Thank you for taking time to come for your Medicare Wellness Visit. I appreciate your ongoing commitment to your health goals. Please review the following plan we discussed and let me know if I can assist you in the future.   Screening recommendations/referrals: Colonoscopy: no longer required  Mammogram: no longer required  Bone Density: declined  Recommended yearly ophthalmology/optometry visit for glaucoma screening and checkup Recommended yearly dental visit for hygiene and checkup  Vaccinations: Influenza vaccine: declined  Pneumococcal vaccine: declined  Tdap vaccine: 05/22/2014 Shingles vaccine: declined     Advanced directives: none   Conditions/risks identified: none   Next appointment: none    Preventive Care 79 Years and Older, Female Preventive care refers to lifestyle choices and visits with your health care provider that can promote health and wellness. What does preventive care include? A yearly physical exam. This is also called an annual well check. Dental exams once or twice a year. Routine eye exams. Ask your health care provider how often you should have your eyes checked. Personal lifestyle choices, including: Daily care of your teeth and gums. Regular physical activity. Eating a healthy diet. Avoiding tobacco and drug use. Limiting alcohol use. Practicing safe sex. Taking low-dose aspirin every day. Taking vitamin and mineral supplements as recommended by your health care provider. What happens during an annual well check? The services and screenings done by your health care provider during your annual well check will depend on your age, overall health, lifestyle risk factors, and family history of disease. Counseling  Your health care provider may ask you questions about your: Alcohol use. Tobacco use. Drug use. Emotional well-being. Home and relationship well-being. Sexual activity. Eating habits. History of falls. Memory and  ability to understand (cognition). Work and work Statistician. Reproductive health. Screening  You may have the following tests or measurements: Height, weight, and BMI. Blood pressure. Lipid and cholesterol levels. These may be checked every 5 years, or more frequently if you are over 62 years old. Skin check. Lung cancer screening. You may have this screening every year starting at age 44 if you have a 30-pack-year history of smoking and currently smoke or have quit within the past 15 years. Fecal occult blood test (FOBT) of the stool. You may have this test every year starting at age 37. Flexible sigmoidoscopy or colonoscopy. You may have a sigmoidoscopy every 5 years or a colonoscopy every 10 years starting at age 24. Hepatitis C blood test. Hepatitis B blood test. Sexually transmitted disease (STD) testing. Diabetes screening. This is done by checking your blood sugar (glucose) after you have not eaten for a while (fasting). You may have this done every 1-3 years. Bone density scan. This is done to screen for osteoporosis. You may have this done starting at age 34. Mammogram. This may be done every 1-2 years. Talk to your health care provider about how often you should have regular mammograms. Talk with your health care provider about your test results, treatment options, and if necessary, the need for more tests. Vaccines  Your health care provider may recommend certain vaccines, such as: Influenza vaccine. This is recommended every year. Tetanus, diphtheria, and acellular pertussis (Tdap, Td) vaccine. You may need a Td booster every 10 years. Zoster vaccine. You may need this after age 26. Pneumococcal 13-valent conjugate (PCV13) vaccine. One dose is recommended after age 76. Pneumococcal polysaccharide (PPSV23) vaccine. One dose is recommended after age 20. Talk to your health care provider about which screenings and  vaccines you need and how often you need them. This information is  not intended to replace advice given to you by your health care provider. Make sure you discuss any questions you have with your health care provider. Document Released: 03/07/2015 Document Revised: 10/29/2015 Document Reviewed: 12/10/2014 Elsevier Interactive Patient Education  2017 [place] Prevention in the Home Falls can cause injuries. They can happen to people of all ages. There are many things you can do to make your home safe and to help prevent falls. What can I do on the outside of my home? Regularly fix the edges of walkways and driveways and fix any cracks. Remove anything that might make you trip as you walk through a door, such as a raised step or threshold. Trim any bushes or trees on the path to your home. Use bright outdoor lighting. Clear any walking paths of anything that might make someone trip, such as rocks or tools. Regularly check to see if handrails are loose or broken. Make sure that both sides of any steps have handrails. Any raised decks and porches should have guardrails on the edges. Have any leaves, snow, or ice cleared regularly. Use sand or salt on walking paths during winter. Clean up any spills in your garage right away. This includes oil or grease spills. What can I do in the bathroom? Use night lights. Install grab bars by the toilet and in the tub and shower. Do not use towel bars as grab bars. Use non-skid mats or decals in the tub or shower. If you need to sit down in the shower, use a plastic, non-slip stool. Keep the floor dry. Clean up any water that spills on the floor as soon as it happens. Remove soap buildup in the tub or shower regularly. Attach bath mats securely with double-sided non-slip rug tape. Do not have throw rugs and other things on the floor that can make you trip. What can I do in the bedroom? Use night lights. Make sure that you have a light by your bed that is easy to reach. Do not use any sheets or blankets that  are too big for your bed. They should not hang down onto the floor. Have a firm chair that has side arms. You can use this for support while you get dressed. Do not have throw rugs and other things on the floor that can make you trip. What can I do in the kitchen? Clean up any spills right away. Avoid walking on wet floors. Keep items that you use a lot in easy-to-reach places. If you need to reach something above you, use a strong step stool that has a grab bar. Keep electrical cords out of the way. Do not use floor polish or wax that makes floors slippery. If you must use wax, use non-skid floor wax. Do not have throw rugs and other things on the floor that can make you trip. What can I do with my stairs? Do not leave any items on the stairs. Make sure that there are handrails on both sides of the stairs and use them. Fix handrails that are broken or loose. Make sure that handrails are as long as the stairways. Check any carpeting to make sure that it is firmly attached to the stairs. Fix any carpet that is loose or worn. Avoid having throw rugs at the top or bottom of the stairs. If you do have throw rugs, attach them to the floor with carpet tape. Make  sure that you have a light switch at the top of the stairs and the bottom of the stairs. If you do not have them, ask someone to add them for you. What else can I do to help prevent falls? Wear shoes that: Do not have high heels. Have rubber bottoms. Are comfortable and fit you well. Are closed at the toe. Do not wear sandals. If you use a stepladder: Make sure that it is fully opened. Do not climb a closed stepladder. Make sure that both sides of the stepladder are locked into place. Ask someone to hold it for you, if possible. Clearly mark and make sure that you can see: Any grab bars or handrails. First and last steps. Where the edge of each step is. Use tools that help you move around (mobility aids) if they are needed. These  include: Canes. Walkers. Scooters. Crutches. Turn on the lights when you go into a dark area. Replace any light bulbs as soon as they burn out. Set up your furniture so you have a clear path. Avoid moving your furniture around. If any of your floors are uneven, fix them. If there are any pets around you, be aware of where they are. Review your medicines with your doctor. Some medicines can make you feel dizzy. This can increase your chance of falling. Ask your doctor what other things that you can do to help prevent falls. This information is not intended to replace advice given to you by your health care provider. Make sure you discuss any questions you have with your health care provider. Document Released: 12/05/2008 Document Revised: 07/17/2015 Document Reviewed: 03/15/2014 Elsevier Interactive Patient Education  2017 [employer].

## 2021-07-10 NOTE — Progress Notes (Addendum)
Subjective:   [name] is a 81 y.o. female who presents for Medicare Annual (Subsequent) preventive examination.   I connected with Lanney Gins today by telephone and verified that I am speaking with the correct person using two identifiers. Location patient: home Location provider: work Persons participating in the virtual visit: patient, provider.   I discussed the limitations, risks, security and privacy concerns of performing an evaluation and management service by telephone and the availability of in person appointments. I also discussed with the patient that there may be a patient responsible charge related to this service. The patient expressed understanding and verbally consented to this telephonic visit.    Interactive audio and video telecommunications were attempted between this provider and patient, however failed, due to patient having technical difficulties OR patient did not have access to video capability.  We continued and completed visit with audio only.    Review of Systems     Cardiac Risk Factors include: advanced age (>54mn, >>15women);dyslipidemia     Objective:    Today's Vitals   There is no height or weight on file to calculate BMI.     07/10/2021   10:56 AM 04/19/2020    2:00 PM 10/18/2019    1:13 PM 12/24/2015    3:04 PM 07/15/2015    9:54 AM 05/22/2015   10:04 AM 01/24/2015    9:48 PM  Advanced Directives  Does Patient Have a Medical Advance Directive? Yes No No No No No No  Type of [place] will        Copy of [place] Chart? No - copy requested        Would patient like information on creating a medical advance directive?  No - Patient declined No - Patient declined No - patient declined information No - patient declined information No - patient declined information No - patient declined information    Current Medications (verified) Outpatient Encounter Medications as of  07/10/2021  Medication Sig   albuterol (PROVENTIL HFA;VENTOLIN HFA) 108 (90 Base) MCG/ACT inhaler Inhale 2 puffs into the lungs every 6 (six) hours as needed for wheezing or shortness of breath.   Blood Glucose Monitoring Suppl (TRUE METRIX METER) w/Device KIT 1 each by Does not apply route in the morning and at bedtime.   Colchicine (MITIGARE) 0.6 MG CAPS Take 1 tablet by mouth 2 (two) times daily.   diltiazem (CARDIZEM CD) 120 MG 24 hr capsule TAKE 1 CAP DAILY. TAKE W/DILTIAZEM 240MG FOR 360MG/DAY. MAY TAKE 1 ADDITIONAL CAP AS NEEDED FOR BREAKTHROUGH AFIB   diltiazem (CARDIZEM CD) 240 MG 24 hr capsule TAKE 1 CAPSULE BY MOUTH DAILY. TAKE IN ADDITION TO DILTIAZEM 120 FOR TOTAL OF 360 MG DAILY   glucose blood (TRUE METRIX BLOOD GLUCOSE TEST) test strip Use as instructed   Lancets (ACCU-CHEK SOFT TOUCH) lancets Use to check blood sugars twice day   losartan (COZAAR) 100 MG tablet Take 0.5 tablets (50 mg total) by mouth in the morning and at bedtime.   sodium chloride (OCEAN) 0.65 % SOLN nasal spray Place 1 spray into both nostrils as needed for congestion.   traMADol (ULTRAM) 50 MG tablet TAKE 1 TABLET BY MOUTH DAILY AS NEEDED.   apixaban (ELIQUIS) 5 MG TABS tablet Take 1 tablet (5 mg total) by mouth 2 (two) times daily. (Patient not taking: Reported on 07/10/2021)   hydrocortisone cream 1 % Apply 1 application topically See admin instructions. 1 application  under breast after showers and 1 application as needed for itching (Patient not taking: Reported on 07/10/2021)   pravastatin (PRAVACHOL) 20 MG tablet Take 1 tablet (20 mg total) by mouth daily. (Patient not taking: Reported on 07/10/2021)   triamcinolone ointment (KENALOG) 0.5 % Apply 1 application. topically 2 (two) times daily. (Patient not taking: Reported on 07/10/2021)   No facility-administered encounter medications on file as of 07/10/2021.    Allergies (verified) Benazepril, Lopressor [metoprolol], Adhesive [tape], Morphine, Nifedipine,  and Piroxicam   History: Past Medical History:  Diagnosis Date   Allergic rhinitis    Allergy    Anxiety    and panic attacks;pt states that she is claustrophobic   Asthma    Atrial fibrillation ([place])    Used to take Coumadin-instructed to stop   Cataract    bilateral surgery to remove   Chronic back pain    hx buldging disc   Colon cancer ([place]) 2007   s/p surgery and chemo   Constipation    takes Colace prn   Diabetes mellitus    glimepiride   DJD (degenerative joint disease)    Fibromyalgia    but doesn't take any meds   Gastric ulcer    GERD (gastroesophageal reflux disease)    uses Tums prn   Gout    takes Colchicine prn   History of blood clots    right lung in early 90's   History of colon polyps    History of migraine headaches    last migraine 27yr ago;Pt states she does get sinus headaches   History of PSVT (paroxysmal supraventricular tachycardia) no current problems   History of shingles    HLD (hyperlipidemia)    diet control - no meds   Hypertension    takes Micardis and Diltiazem daily   Osteoarthritis    Peptic ulcer disease    Peripheral edema    takes Furosemide every other day   Sleep apnea    doesn't use CPAP   Past Surgical History:  Procedure Laterality Date   ANKLE SURGERY     tumor-benign removed left ankle   ANTERIOR CERVICAL DECOMP/DISCECTOMY FUSION  01/12/2011   Procedure: ANTERIOR CERVICAL DECOMPRESSION/DISCECTOMY FUSION 2 LEVELS;  Surgeon: HCooper RenderPool;  [facility];  Service: Neurosurgery;  Laterality: Bilateral;  Cervical five-six, six-seven anterior cervical discectomy and fusion with allograft and plating   ANTERIOR CERVICAL DECOMP/DISCECTOMY FUSION  06/11/2011   Procedure: ANTERIOR CERVICAL DECOMPRESSION/DISCECTOMY FUSION 1 LEVEL/HARDWARE REMOVAL;  Surgeon: HCharlie Pitter MD;  [facility];  Service: Neurosurgery;  Laterality: N/A;  Cervical four-five anterior cervical decompression fusion with allograft and  plating; Removal of Cervical five to seven plate   bilateral cataracts removed     caesarean section  1966/69/72   x 3   CARDIAC CATHETERIZATION  90's and 2005   CARPAL TUNNEL RELEASE Left 11/2013   cataract surgery  2010   COLECTOMY  2007   colon cancer   COLONOSCOPY     DILATION AND CURETTAGE OF UTERUS  02-19-11   & POLYP REMOVAL   GANGLION CYST EXCISION  > 89[place]ago   removed from left pointer finger   HEEL SPUR SURGERY Right 1992   x 2   HYSTEROSCOPY WITH D & C  02/19/2011   Procedure: DILATATION AND CURETTAGE /HYSTEROSCOPY;  Surgeon: TiAnastasio AuerbachMD;  [facility];  Service: Gynecology;  Laterality: N/A;  requests one hour   JOINT REPLACEMENT Left  knee   knee arthrosocpy     bil;couple of years before knee replacement   KNEE SURGERY     left TKA   LEFT HEART CATH AND CORONARY ANGIOGRAPHY N/A 04/21/2020   Procedure: LEFT HEART CATH AND CORONARY ANGIOGRAPHY;  Surgeon: Lorretta Harp, MD;  Location: [facility];  Service: Cardiovascular;  Laterality: N/A;   [place] SURGERY  2008   port a cath placed  2008   port a cath removed  2008   pyelonidal cystectomy     at age 77   SHOULDER ARTHROSCOPY WITH SUBACROMIAL DECOMPRESSION AND OPEN ROTATOR C Right 03/16/2013   Procedure: RIGHT SHOULDER ARTHROSCOPY WITH SUBACROMIAL DECOMPRESSION AND OPEN ROTATOR CUFF REPAIR, OPEN DISTAL CLAVICLE  RESECTION POSSIBLE BICEP TENODESIS ;  Surgeon: Augustin Schooling, MD;  Location: [facility];  Service: Orthopedics;  Laterality: Right;   TRIGGER FINGER RELEASE Left    thumb, tendonitis, tumor removed   TUBAL LIGATION  1972   Family History  Problem Relation Age of Onset   Heart attack Mother    Stroke Mother    Hypertension Mother    Heart disease Father    Hypertension Sister    Kidney cancer Sister        left   Bladder Cancer Brother    Liver cancer Brother        spread to colon   Coronary artery disease Other    Anesthesia problems Neg Hx    Hypotension Neg Hx     Malignant hyperthermia Neg Hx    Pseudochol deficiency Neg Hx    Stomach cancer Neg Hx    Rectal cancer Neg Hx    Social History   Socioeconomic History   Marital status: Married    Spouse name: Not on file   Number of children: 3   Years of education: Not on file   Highest education level: Not on file  Occupational History   Occupation: retired  Tobacco Use   Smoking status: Former    Packs/day: 1.50    Years: 30.00    Pack years: 45.00    Types: Cigarettes    Quit date: 02/23/1991    Years since quitting: 30.3   Smokeless tobacco: Never  Vaping Use   Vaping Use: Never used  Substance and Sexual Activity   Alcohol use: No    Alcohol/week: 0.0 standard drinks   Drug use: No   Sexual activity: Yes    Partners: Male    Birth control/protection: Post-menopausal  Other Topics Concern   Not on file  Social History Narrative   Regular exercise- no.   Social Determinants of Health   Financial Resource Strain: Low Risk    Difficulty of Paying Living Expenses: Not hard at all  Food Insecurity: No Food Insecurity   Worried About Charity fundraiser in the Last Year: Never true   [place] in the Last Year: Never true  Transportation Needs: No Transportation Needs   Lack of Transportation (Medical): No   Lack of Transportation (Non-Medical): No  Physical Activity: Insufficiently Active   Days of Exercise per Week: 2 days   Minutes of Exercise per Session: 20 min  Stress: No Stress Concern Present   Feeling of Stress : Not at all  Social Connections: Moderately Integrated   Frequency of Communication with Friends and Family: Twice a week   Frequency of Social Gatherings with Friends and Family: Twice a week   Attends Religious Services: 1  to 4 times per year   Active Member of Clubs or Organizations: No   Attends Archivist Meetings: Never   Marital Status: Married    Tobacco Counseling Counseling given: Not Answered   Clinical Intake:  Pre-visit  preparation completed: Yes  Pain : No/denies pain     Nutritional Risks: None Diabetes: No  How often do you need to have someone help you when you read instructions, pamphlets, or other written materials from your doctor or pharmacy?: 2 - Rarely What is the last grade level you completed in school?: 8 th grade  Diabetic?no   Interpreter Needed?: No  Information entered by :: L.Azlee Monforte,LPN   Activities of Daily Living    07/10/2021   10:59 AM  In your present state of health, do you have any difficulty performing the following activities:  Hearing? 0  Vision? 0  Difficulty concentrating or making decisions? 0  Walking or climbing stairs? 0  Dressing or bathing? 0  Doing errands, shopping? 0  Preparing Food and eating ? N  Using the Toilet? N  In the past six months, have you accidently leaked urine? N  Do you have problems with loss of bowel control? N  Managing your Medications? N  Managing your Finances? N  Housekeeping or managing your Housekeeping? N    Patient Care Team: Hoyt Koch, MD as PCP - General (Internal Medicine) Fay Records, MD as PCP - Cardiology (Cardiology) Fay Records, MD as Consulting Physician (Cardiology)  Indicate any recent Medical Services you may have received from other than [facility] providers in the past year (date may be approximate).     Assessment:   This is a routine wellness examination for Ishia.  Hearing/Vision screen Vision Screening - Comments:: Annual eye exams wears glasses   Dietary issues and exercise activities discussed: Current Exercise Habits: Home exercise routine, Type of exercise: walking, Time (Minutes): 30, Frequency (Times/Week): 2, Weekly Exercise (Minutes/Week): 60, Intensity: Mild, Exercise limited by: cardiac condition(s)   Goals Addressed   None    Depression Screen    07/10/2021   10:57 AM 07/10/2021   10:55 AM 04/27/2021    4:29 PM 03/24/2021    1:39 PM 02/04/2020    8:50 AM 08/03/2017    10:06 AM 02/28/2015   11:32 AM  PHQ 2/9 Scores  PHQ - 2 Score 0 0 0 0 1 0 0    Fall Risk    07/10/2021   10:57 AM 04/27/2021    4:28 PM 03/24/2021    1:39 PM 02/04/2020    8:50 AM 08/03/2017   10:06 AM  Fall Risk   Falls in the past year? 0 0 0 1 No  Number falls in past yr: 0 0 0 0   Injury with Fall? 0 0 0 1   Risk for fall due to :  No Fall Risks     Follow up Falls evaluation completed Falls evaluation completed       Thor:  Any stairs in or around the home? No  If so, are there any without handrails? No  Home free of loose throw rugs in walkways, pet beds, electrical cords, etc? Yes  Adequate lighting in your home to reduce risk of falls? Yes   ASSISTIVE DEVICES UTILIZED TO PREVENT FALLS:  Life alert? No  Use of a cane, walker or w/c? No  Grab bars in the bathroom? Yes  Shower chair or bench in shower?  Yes  Elevated toilet seat or a handicapped toilet? Yes    Cognitive Function:    Normal cognitive status assessed by telephone conversation by this Nurse Health Advisor. No abnormalities found.      Immunizations Immunization History  Administered Date(s) Administered   PFIZER(Purple Top)SARS-COV-2 Vaccination 11/23/2019, 12/17/2019   Td 03/26/2003   Tdap 05/22/2014    TDAP status: Up to date  Flu Vaccine status: Declined, Education has been provided regarding the importance of this vaccine but patient still declined. Advised may receive this vaccine at local pharmacy or Health Dept. Aware to provide a copy of the vaccination record if obtained from local pharmacy or Health Dept. Verbalized acceptance and understanding.  Pneumococcal vaccine status: Declined,  Education has been provided regarding the importance of this vaccine but patient still declined. Advised may receive this vaccine at local pharmacy or Health Dept. Aware to provide a copy of the vaccination record if obtained from local pharmacy or Health Dept. Verbalized  acceptance and understanding.   Covid-19 vaccine status: Completed vaccines  Qualifies for Shingles Vaccine? Yes   Zostavax completed No   Shingrix Completed?: No.    Education has been provided regarding the importance of this vaccine. Patient has been advised to call insurance company to determine out of pocket expense if they have not yet received this vaccine. Advised may also receive vaccine at local pharmacy or Health Dept. Verbalized acceptance and understanding.  Screening Tests Health Maintenance  Topic Date Due   Hepatitis C Screening  Never done   Zoster Vaccines- Shingrix (1 of 2) Never done   Pneumonia Vaccine 41+ Years old (1 - PCV) Never done   DEXA SCAN  Never done   OPHTHALMOLOGY EXAM  12/31/2018   COVID-19 Vaccine (3 - Pfizer risk series) 01/14/2020   HEMOGLOBIN A1C  09/21/2021   INFLUENZA VACCINE  09/22/2021   FOOT EXAM  03/24/2022   COLONOSCOPY (Pts 45-74yr Insurance coverage will need to be confirmed)  09/22/2022   TETANUS/TDAP  05/21/2024   HPV VACCINES  Aged Out    Health Maintenance  Health Maintenance Due  Topic Date Due   Hepatitis C Screening  Never done   Zoster Vaccines- Shingrix (1 of 2) Never done   Pneumonia Vaccine 79 Years old (1 - PCV) Never done   DEXA SCAN  Never done   OPHTHALMOLOGY EXAM  12/31/2018   COVID-19 Vaccine (3 - Pfizer risk series) 01/14/2020    Colorectal cancer screening: No longer required.   Mammogram status: No longer required due to age.  Bone Density status: Ordered patient declined . Pt provided with contact info and advised to call to schedule appt.  Lung Cancer Screening: (Low Dose CT Chest recommended if Age 79-80years, 30 pack-year currently smoking OR have quit w/in 15years.) does not qualify.   Lung Cancer Screening Referral: n/a  Additional Screening:  Hepatitis C Screening: does not qualify;   Vision Screening: Recommended annual ophthalmology exams for early detection of glaucoma and other  disorders of the eye. Is the patient up to date with their annual eye exam?  Yes  Who is the provider or what is the name of the office in which the patient attends annual eye exams? Dr.McCuen  If pt is not established with a provider, would they like to be referred to a provider to establish care? No .   Dental Screening: Recommended annual dental exams for proper oral hygiene  Community Resource Referral / Chronic Care Management: CRR required this visit?  No   CCM required this visit?  No      Plan:     I have personally reviewed and noted the following in the patient's chart:   Medical and social history Use of alcohol, tobacco or illicit drugs  Current medications and supplements including opioid prescriptions.  Functional ability and status Nutritional status Physical activity Advanced directives List of other physicians Hospitalizations, surgeries, and ER visits in previous 12 months Vitals Screenings to include cognitive, depression, and falls Referrals and appointments  In addition, I have reviewed and discussed with patient certain preventive protocols, quality metrics, and best practice recommendations. A written personalized care plan for preventive services as well as general preventive health recommendations were provided to patient.     Randel Pigg, LPN   02/25/1279   Nurse Notes: none

## 2021-07-13 NOTE — Telephone Encounter (Signed)
Noted  

## 2021-07-13 NOTE — Telephone Encounter (Signed)
This is not a current medication and should not be refilled

## 2021-07-16 ENCOUNTER — Ambulatory Visit (INDEPENDENT_AMBULATORY_CARE_PROVIDER_SITE_OTHER): Payer: Medicare HMO | Admitting: Internal Medicine

## 2021-07-16 ENCOUNTER — Encounter: Payer: Self-pay | Admitting: Internal Medicine

## 2021-07-16 VITALS — BP 130/72 | HR 63 | Resp 18 | Ht 63.0 in | Wt 196.8 lb

## 2021-07-16 DIAGNOSIS — R0602 Shortness of breath: Secondary | ICD-10-CM

## 2021-07-16 DIAGNOSIS — J4520 Mild intermittent asthma, uncomplicated: Secondary | ICD-10-CM

## 2021-07-16 DIAGNOSIS — M791 Myalgia, unspecified site: Secondary | ICD-10-CM

## 2021-07-16 DIAGNOSIS — I4891 Unspecified atrial fibrillation: Secondary | ICD-10-CM

## 2021-07-16 LAB — CBC
HCT: 39.9 % (ref 36.0–46.0)
Hemoglobin: 13.4 g/dL (ref 12.0–15.0)
MCHC: 33.7 g/dL (ref 30.0–36.0)
MCV: 89.6 fl (ref 78.0–100.0)
Platelets: 233 10*3/uL (ref 150.0–400.0)
RBC: 4.45 Mil/uL (ref 3.87–5.11)
RDW: 14.4 % (ref 11.5–15.5)
WBC: 8.1 10*3/uL (ref 4.0–10.5)

## 2021-07-16 LAB — COMPREHENSIVE METABOLIC PANEL
ALT: 11 U/L (ref 0–35)
AST: 16 U/L (ref 0–37)
Albumin: 4.3 g/dL (ref 3.5–5.2)
Alkaline Phosphatase: 90 U/L (ref 39–117)
BUN: 28 mg/dL — ABNORMAL HIGH (ref 6–23)
CO2: 29 mEq/L (ref 19–32)
Calcium: 9.5 mg/dL (ref 8.4–10.5)
Chloride: 103 mEq/L (ref 96–112)
Creatinine, Ser: 1.25 mg/dL — ABNORMAL HIGH (ref 0.40–1.20)
GFR: 41.07 mL/min — ABNORMAL LOW (ref >60.00)
Glucose, Bld: 99 mg/dL (ref 70–99)
Potassium: 4.3 mEq/L (ref 3.5–5.1)
Sodium: 139 mEq/L (ref 135–145)
Total Bilirubin: 0.5 mg/dL (ref 0.2–1.2)
Total Protein: 6.6 g/dL (ref 6.0–8.3)

## 2021-07-16 LAB — VITAMIN B12: Vitamin B-12: 301 pg/mL (ref 211–911)

## 2021-07-16 LAB — BRAIN NATRIURETIC PEPTIDE: Pro B Natriuretic peptide (BNP): 112 pg/mL — ABNORMAL HIGH (ref 0.0–100.0)

## 2021-07-16 LAB — TSH: TSH: 4.1 u[IU]/mL (ref 0.35–5.50)

## 2021-07-16 LAB — MAGNESIUM: Magnesium: 2.2 mg/dL (ref 1.5–2.5)

## 2021-07-16 LAB — VITAMIN D 25 HYDROXY (VIT D DEFICIENCY, FRACTURES): VITD: 23.02 ng/mL — ABNORMAL LOW (ref 30.00–100.00)

## 2021-07-16 LAB — CK: Total CK: 59 U/L (ref 7–177)

## 2021-07-16 MED ORDER — ONDANSETRON HCL 4 MG PO TABS: 4.0000 mg | ORAL_TABLET | Freq: Three times a day (TID) | ORAL | 0 refills | Status: DC | PRN

## 2021-07-16 NOTE — Progress Notes (Signed)
   Subjective:   Patient ID: [name], female    DOB: 1940/07/13, 81 y.o.   MRN: [id number]  HPI The patient is having muscle aches and SOB on exertion lately. Feels this is worsening in the last few months maybe. Muscles are much worse.   Review of Systems  Constitutional: Negative.   HENT: Negative.    Eyes: Negative.   Respiratory:  Positive for shortness of breath. Negative for cough and chest tightness.   Cardiovascular:  Negative for chest pain, palpitations and leg swelling.  Gastrointestinal:  Negative for abdominal distention, abdominal pain, constipation, diarrhea, nausea and vomiting.  Musculoskeletal:  Positive for arthralgias and myalgias.  Skin: Negative.   Neurological: Negative.   Psychiatric/Behavioral: Negative.     Objective:  Physical Exam Constitutional:      Appearance: She is well-developed. She is obese.  HENT:     Head: Normocephalic and atraumatic.  Cardiovascular:     Rate and Rhythm: Normal rate and regular rhythm.  Pulmonary:     Effort: Pulmonary effort is normal. No respiratory distress.     Breath sounds: Normal breath sounds. No wheezing or rales.  Abdominal:     General: Bowel sounds are normal. There is no distension.     Palpations: Abdomen is soft.     Tenderness: There is no abdominal tenderness. There is no rebound.  Musculoskeletal:        General: Tenderness present.     Cervical back: Normal range of motion.  Skin:    General: Skin is warm and dry.  Neurological:     Mental Status: She is alert and oriented to person, place, and time.     Coordination: Coordination normal.    Vitals:   07/16/21 0947  BP: 130/72  Pulse: 63  Resp: 18  SpO2: 98%  Weight: 196 lb 12.8 oz (89.3 kg)  Height: '5\' 3"'$  (1.6 m)    Assessment & Plan:

## 2021-07-16 NOTE — Patient Instructions (Signed)
We are going to check labs today to help figure out what is going on.  We have sent in zofran for nausea to use if needed.

## 2021-07-17 ENCOUNTER — Other Ambulatory Visit: Payer: Self-pay | Admitting: Internal Medicine

## 2021-07-17 DIAGNOSIS — M791 Myalgia, unspecified site: Secondary | ICD-10-CM | POA: Insufficient documentation

## 2021-07-17 DIAGNOSIS — R0602 Shortness of breath: Secondary | ICD-10-CM | POA: Insufficient documentation

## 2021-07-17 MED ORDER — VITAMIN D (ERGOCALCIFEROL) 1.25 MG (50000 UNIT) PO CAPS: 50000.0000 [IU] | ORAL_CAPSULE | ORAL | 0 refills | Status: DC

## 2021-07-17 NOTE — Assessment & Plan Note (Addendum)
Sounds to be worsening lately. Unclear etiology. Checking CK and BNP and CBC and CMP and B12 and TSH and vitamin D to rule out heart failure, muscle pain, anemia, kidney and liver problems. If no etiology will try prednisone course given concurrent asthma although lungs are clear and this does not sound like a clear asthma flare. If this does not help may need to see her cardiologist to rule out cardiac etiology.

## 2021-07-17 NOTE — Assessment & Plan Note (Signed)
No clear flare today and she is not really getting relief from SOB with albuterol inhaler. No wheezing on exam and good airflow. Continue albuterol prn and checking labs to assess. If no etiology in labs we could consider steroid course to see If she gets improvement in SOB on exertion.

## 2021-07-17 NOTE — Assessment & Plan Note (Signed)
Checking CMP and CBC and magnesium and B12 and vitamin D and TSH for metabolic etiology. Advised to try calcium or magnesium supplement and hydration to help.

## 2021-07-17 NOTE — Assessment & Plan Note (Signed)
It is possible that her A fib is causing SOB on exertion. She sounds regular today. Checking labs including CBC, CMP, BNP, CK, magnesium, B12, TSH, vitamin D. If no etiology it may be worthwhile to return to cardiology to assess if the SOB is related to A fib episodes or other cardiac etiology.

## 2021-07-31 ENCOUNTER — Other Ambulatory Visit: Payer: Self-pay | Admitting: Internal Medicine

## 2021-08-20 ENCOUNTER — Other Ambulatory Visit: Payer: Self-pay | Admitting: Internal Medicine

## 2021-09-15 ENCOUNTER — Other Ambulatory Visit: Payer: Self-pay | Admitting: General Practice

## 2021-09-24 ENCOUNTER — Other Ambulatory Visit: Payer: Self-pay | Admitting: *Deleted

## 2021-09-24 NOTE — Patient Outreach (Signed)
  Care Coordination   Initial Visit Note   09/24/2021 Name: DESMA WILKOWSKI MRN: [id number] DOB: 03-03-1940  KATALENA MALVEAUX is a 81 y.o. year old female who sees Hoyt Koch, MD for primary care. I spoke with  [name] by phone today RN discussed services available at [facility] [employer] [employer]. Patient declined services.    SDOH assessments and interventions completed:  Yes     Care Coordination Interventions Activated:  No  Care Coordination Interventions:  No, not indicated   Follow up plan: No further intervention required.   Encounter Outcome:  Pt. [facility] Care Management [phone]

## 2021-12-14 ENCOUNTER — Other Ambulatory Visit: Payer: Self-pay | Admitting: Internal Medicine

## 2021-12-18 ENCOUNTER — Ambulatory Visit (INDEPENDENT_AMBULATORY_CARE_PROVIDER_SITE_OTHER): Payer: Medicare HMO

## 2021-12-18 ENCOUNTER — Ambulatory Visit: Payer: Medicare HMO | Admitting: Podiatry

## 2021-12-18 ENCOUNTER — Encounter: Payer: Self-pay | Admitting: Podiatry

## 2021-12-18 DIAGNOSIS — M779 Enthesopathy, unspecified: Secondary | ICD-10-CM | POA: Diagnosis not present

## 2021-12-18 DIAGNOSIS — M778 Other enthesopathies, not elsewhere classified: Secondary | ICD-10-CM

## 2021-12-18 DIAGNOSIS — L600 Ingrowing nail: Secondary | ICD-10-CM

## 2021-12-18 NOTE — Patient Instructions (Signed)

## 2021-12-19 NOTE — Progress Notes (Signed)
Subjective:   Patient ID: [name], female   DOB: 81 y.o.   MRN: [id number]   HPI Patient presents stating she has a lot of pain on top of her left foot that is been present for a number of months and does not remember injury and also states she has chronic damage of her big toenails both feet that are painful and she cannot cut.  States that she would like to have them removed permanently as she is not able to take care of them.  Patient does not smoke does like to be active   ROS      Objective:  Physical Exam  Neurovascular status intact muscle strength adequate with patient found to have deformed hallux nails bilateral that are dystrophic pressed into the corners slight redness no active drainage noted with inflammation pain midtarsal joint left with fluid buildup around the extensor complex     Assessment:  Chronic structural nail disease hallux bilateral along with probable arthritis midtarsal joint left with inflammatory extensor tendinitis     Plan:  H&P x-ray left reviewed discussed nail removal explained procedures risk patient signed consent form and after they were signed I anesthetized 60 mg like Marcaine mixture I did sterile prep to each toe and using sterile instrumentation remove the hallux nails.  Exposed brace applied phenol for applications 30 seconds followed by alcohol lavage sterile dressing instructed on leaving these on 24 hours and take them off earlier if any throbbing were to occur.  The extensor complex was prepped and injected 3 mg Dexasone Kenalog 5 mg Xylocaine after explaining chances for rupture associated with injection treatment.  Reappoint as needed may require CT scan of the left with possible fusion if symptoms do not improve  X-rays indicate that there is multiple signs of spurring and narrowing of the joint surfaces midtarsal joint left consistent with arthritis

## 2022-01-04 ENCOUNTER — Ambulatory Visit: Payer: Medicare HMO | Admitting: Podiatry

## 2022-01-16 DIAGNOSIS — Z0100 Encounter for examination of eyes and vision without abnormal findings: Secondary | ICD-10-CM | POA: Diagnosis not present

## 2022-01-16 DIAGNOSIS — H43393 Other vitreous opacities, bilateral: Secondary | ICD-10-CM | POA: Diagnosis not present

## 2022-01-16 DIAGNOSIS — H524 Presbyopia: Secondary | ICD-10-CM | POA: Diagnosis not present

## 2022-03-24 ENCOUNTER — Other Ambulatory Visit: Payer: Self-pay | Admitting: Internal Medicine

## 2022-04-07 ENCOUNTER — Other Ambulatory Visit: Payer: Self-pay | Admitting: Internal Medicine

## 2022-04-21 ENCOUNTER — Other Ambulatory Visit: Payer: Self-pay | Admitting: Internal Medicine

## 2022-04-21 NOTE — Telephone Encounter (Signed)
Ok to pcp please 

## 2022-05-10 ENCOUNTER — Other Ambulatory Visit: Payer: Self-pay | Admitting: Internal Medicine

## 2022-05-23 ENCOUNTER — Other Ambulatory Visit: Payer: Self-pay | Admitting: Internal Medicine

## 2022-06-09 ENCOUNTER — Other Ambulatory Visit: Payer: Self-pay | Admitting: Internal Medicine

## 2022-06-09 MED ORDER — DILTIAZEM HCL ER COATED BEADS 120 MG PO CP24: ORAL_CAPSULE | ORAL | 0 refills | Status: DC

## 2022-06-14 ENCOUNTER — Other Ambulatory Visit: Payer: Self-pay | Admitting: Internal Medicine

## 2022-06-25 ENCOUNTER — Other Ambulatory Visit: Payer: Self-pay | Admitting: Internal Medicine

## 2022-06-28 ENCOUNTER — Telehealth: Payer: Self-pay | Admitting: Internal Medicine

## 2022-06-28 NOTE — Telephone Encounter (Signed)
Contacted [name] to schedule their annual wellness visit. Appointment made for 07/13/2022.  [facility] Care Guide [facility] AW[facility] TEAM Direct Dial: [phone]

## 2022-07-02 ENCOUNTER — Encounter: Payer: Self-pay | Admitting: Internal Medicine

## 2022-07-02 ENCOUNTER — Ambulatory Visit (INDEPENDENT_AMBULATORY_CARE_PROVIDER_SITE_OTHER): Payer: Medicare HMO | Admitting: Internal Medicine

## 2022-07-02 ENCOUNTER — Other Ambulatory Visit: Payer: Self-pay | Admitting: Internal Medicine

## 2022-07-02 VITALS — BP 130/80 | HR 81 | Temp 98.0°F | Ht 63.0 in | Wt 200.0 lb

## 2022-07-02 DIAGNOSIS — I10 Essential (primary) hypertension: Secondary | ICD-10-CM

## 2022-07-02 DIAGNOSIS — M109 Gout, unspecified: Secondary | ICD-10-CM

## 2022-07-02 DIAGNOSIS — N1832 Chronic kidney disease, stage 3b: Secondary | ICD-10-CM | POA: Diagnosis not present

## 2022-07-02 DIAGNOSIS — E1169 Type 2 diabetes mellitus with other specified complication: Secondary | ICD-10-CM | POA: Diagnosis not present

## 2022-07-02 DIAGNOSIS — I480 Paroxysmal atrial fibrillation: Secondary | ICD-10-CM

## 2022-07-02 DIAGNOSIS — M5441 Lumbago with sciatica, right side: Secondary | ICD-10-CM

## 2022-07-02 DIAGNOSIS — Z85038 Personal history of other malignant neoplasm of large intestine: Secondary | ICD-10-CM | POA: Diagnosis not present

## 2022-07-02 DIAGNOSIS — E785 Hyperlipidemia, unspecified: Secondary | ICD-10-CM

## 2022-07-02 DIAGNOSIS — Z0000 Encounter for general adult medical examination without abnormal findings: Secondary | ICD-10-CM | POA: Diagnosis not present

## 2022-07-02 DIAGNOSIS — G8929 Other chronic pain: Secondary | ICD-10-CM

## 2022-07-02 LAB — LIPID PANEL
Cholesterol: 220 mg/dL — ABNORMAL HIGH (ref 0–200)
HDL: 60.3 mg/dL (ref >39.00)
LDL Cholesterol: 132 mg/dL — ABNORMAL HIGH (ref 0–99)
NonHDL: 160.06
Total CHOL/HDL Ratio: 4
Triglycerides: 140 mg/dL (ref 0.0–149.0)
VLDL: 28 mg/dL (ref 0.0–40.0)

## 2022-07-02 LAB — COMPREHENSIVE METABOLIC PANEL
ALT: 10 U/L (ref 0–35)
AST: 17 U/L (ref 0–37)
Albumin: 4.2 g/dL (ref 3.5–5.2)
Alkaline Phosphatase: 86 U/L (ref 39–117)
BUN: 26 mg/dL — ABNORMAL HIGH (ref 6–23)
CO2: 26 mEq/L (ref 19–32)
Calcium: 9.5 mg/dL (ref 8.4–10.5)
Chloride: 103 mEq/L (ref 96–112)
Creatinine, Ser: 1.61 mg/dL — ABNORMAL HIGH (ref 0.40–1.20)
GFR: 30.11 mL/min — ABNORMAL LOW (ref >60.00)
Glucose, Bld: 96 mg/dL (ref 70–99)
Potassium: 4 mEq/L (ref 3.5–5.1)
Sodium: 139 mEq/L (ref 135–145)
Total Bilirubin: 0.5 mg/dL (ref 0.2–1.2)
Total Protein: 6.7 g/dL (ref 6.0–8.3)

## 2022-07-02 LAB — CBC
HCT: 40.1 % (ref 36.0–46.0)
Hemoglobin: 13.6 g/dL (ref 12.0–15.0)
MCHC: 33.9 g/dL (ref 30.0–36.0)
MCV: 89.2 fl (ref 78.0–100.0)
Platelets: 259 10*3/uL (ref 150.0–400.0)
RBC: 4.49 Mil/uL (ref 3.87–5.11)
RDW: 13.9 % (ref 11.5–15.5)
WBC: 9 10*3/uL (ref 4.0–10.5)

## 2022-07-02 LAB — MICROALBUMIN / CREATININE URINE RATIO
Creatinine,U: 271.4 mg/dL
Microalb Creat Ratio: 4.5 mg/g (ref 0.0–30.0)
Microalb, Ur: 12.3 mg/dL — ABNORMAL HIGH (ref 0.0–1.9)

## 2022-07-02 LAB — HEMOGLOBIN A1C: Hgb A1c MFr Bld: 6.5 % (ref 4.6–6.5)

## 2022-07-02 MED ORDER — TRAMADOL HCL 50 MG PO TABS: 50.0000 mg | ORAL_TABLET | Freq: Every day | ORAL | 5 refills | Status: DC | PRN

## 2022-07-02 MED ORDER — ALBUTEROL SULFATE HFA 108 (90 BASE) MCG/ACT IN AERS: 2.0000 | INHALATION_SPRAY | Freq: Four times a day (QID) | RESPIRATORY_TRACT | 1 refills | Status: DC | PRN

## 2022-07-02 MED ORDER — TRUE METRIX METER W/DEVICE KIT: 1.0000 | PACK | Freq: Two times a day (BID) | 0 refills | Status: DC

## 2022-07-02 MED ORDER — TRUE METRIX BLOOD GLUCOSE TEST VI STRP: ORAL_STRIP | 0 refills | Status: DC

## 2022-07-02 MED ORDER — ONDANSETRON HCL 4 MG PO TABS: 4.0000 mg | ORAL_TABLET | Freq: Three times a day (TID) | ORAL | 0 refills | Status: DC | PRN

## 2022-07-02 MED ORDER — COLCHICINE 0.6 MG PO CAPS: 0.6000 mg | ORAL_CAPSULE | Freq: Every day | ORAL | 0 refills | Status: DC | PRN

## 2022-07-02 MED ORDER — PRAVASTATIN SODIUM 20 MG PO TABS: 20.0000 mg | ORAL_TABLET | Freq: Every day | ORAL | 3 refills | Status: DC

## 2022-07-02 NOTE — Patient Instructions (Signed)
We are checking the labs today and have refilled all the medicines.

## 2022-07-02 NOTE — Assessment & Plan Note (Signed)
Checking CEA, no sign of recurrence clinically.

## 2022-07-02 NOTE — Assessment & Plan Note (Signed)
BP at goal continue diltiazem 360 mg daily and losartan 100 mg daily. Checking CMP and adjust as needed.

## 2022-07-02 NOTE — Assessment & Plan Note (Signed)
Checking HgA1c, microalbumin to creatinine ratio, foot exam done, lipid panel and CMP. Adjust as needed. Diet controlled. On ARB and statin.

## 2022-07-02 NOTE — Assessment & Plan Note (Signed)
Checking CMP and adjust as needed. Checking DM status, BP at goal.

## 2022-07-02 NOTE — Assessment & Plan Note (Signed)
Having more episodes lately. Taking diltiazem 360 mg daily and on eliquis 5 mg BID for stroke prevention.

## 2022-07-02 NOTE — Assessment & Plan Note (Signed)
BMI 35 and complicated by hypertension, hyperlipidemia, paroxsymal A fib. Counseled on diet and exercise.

## 2022-07-02 NOTE — Assessment & Plan Note (Signed)
Checking lipid panel and adjust pravastatin 20 mg daily as needed for goal LDL<100.

## 2022-07-02 NOTE — Assessment & Plan Note (Signed)
No flare today but needs refill on colchicine in case.

## 2022-07-02 NOTE — Assessment & Plan Note (Signed)
Flu shot yearly. Pneumonia declines. Shingrix declines. Tetanus due 2026. Colonoscopy aged out. Mammogram aged out, pap smear aged out and dexa complete. Counseled about sun safety and mole surveillance. Counseled about the dangers of distracted driving. Given 10 year screening recommendations.

## 2022-07-02 NOTE — Assessment & Plan Note (Signed)
Takes tramadol 50 mg daily for pain and refilled today for 6 month supply. Checked [place] database and no inappropriate fills.

## 2022-07-02 NOTE — Progress Notes (Signed)
   Subjective:   Patient ID: [name], female    DOB: 12/02/1940, 82 y.o.   MRN: [id number]  HPI The patient is here for physical.  PMH, [facility], social history reviewed and updated  Review of Systems  Constitutional: Negative.   HENT: Negative.    Eyes: Negative.   Respiratory:  Negative for cough, chest tightness and shortness of breath.   Cardiovascular:  Positive for palpitations. Negative for chest pain and leg swelling.  Gastrointestinal:  Negative for abdominal distention, abdominal pain, constipation, diarrhea, nausea and vomiting.  Musculoskeletal:  Positive for arthralgias and myalgias.  Skin: Negative.   Neurological: Negative.   Psychiatric/Behavioral: Negative.      Objective:  Physical Exam Constitutional:      Appearance: She is well-developed.  HENT:     Head: Normocephalic and atraumatic.  Cardiovascular:     Rate and Rhythm: Normal rate and regular rhythm.  Pulmonary:     Effort: Pulmonary effort is normal. No respiratory distress.     Breath sounds: Normal breath sounds. No wheezing or rales.  Abdominal:     General: Bowel sounds are normal. There is no distension.     Palpations: Abdomen is soft.     Tenderness: There is no abdominal tenderness. There is no rebound.  Musculoskeletal:        General: Tenderness present.     Cervical back: Normal range of motion.  Skin:    General: Skin is warm and dry.     Comments: Foot exam done  Neurological:     Mental Status: She is alert and oriented to person, place, and time.     Coordination: Coordination normal.     Vitals:   07/02/22 1426  BP: 130/80  Pulse: 81  Temp: 98 F (36.7 C)  TempSrc: Oral  SpO2: 97%  Weight: 200 lb (90.7 kg)  Height: 5\' 3"  (1.6 m)    Assessment & Plan:

## 2022-07-03 LAB — CEA: CEA: 3 ng/mL — ABNORMAL HIGH

## 2022-07-05 ENCOUNTER — Other Ambulatory Visit: Payer: Self-pay | Admitting: Internal Medicine

## 2022-07-05 ENCOUNTER — Telehealth: Payer: Self-pay | Admitting: Internal Medicine

## 2022-07-05 ENCOUNTER — Other Ambulatory Visit: Payer: Self-pay

## 2022-07-05 DIAGNOSIS — M109 Gout, unspecified: Secondary | ICD-10-CM

## 2022-07-05 MED ORDER — ALBUTEROL SULFATE HFA 108 (90 BASE) MCG/ACT IN AERS: 2.0000 | INHALATION_SPRAY | Freq: Four times a day (QID) | RESPIRATORY_TRACT | 1 refills | Status: AC | PRN

## 2022-07-05 MED ORDER — TRUE METRIX BLOOD GLUCOSE TEST VI STRP: ORAL_STRIP | 0 refills | Status: DC

## 2022-07-05 MED ORDER — ONDANSETRON HCL 4 MG PO TABS: 4.0000 mg | ORAL_TABLET | Freq: Three times a day (TID) | ORAL | 0 refills | Status: DC | PRN

## 2022-07-05 MED ORDER — TRUE METRIX METER W/DEVICE KIT: 1.0000 | PACK | Freq: Two times a day (BID) | 0 refills | Status: DC

## 2022-07-05 MED ORDER — COLCHICINE 0.6 MG PO CAPS: 0.6000 mg | ORAL_CAPSULE | Freq: Every day | ORAL | 0 refills | Status: DC | PRN

## 2022-07-05 MED ORDER — PRAVASTATIN SODIUM 20 MG PO TABS: 20.0000 mg | ORAL_TABLET | Freq: Every day | ORAL | 3 refills | Status: DC

## 2022-07-05 NOTE — Telephone Encounter (Signed)
Patient said that her prescriptions were sent to [facility] [address] last week.  They all need to be cancelled except the tramadol and sent to [facility].

## 2022-07-05 NOTE — Telephone Encounter (Signed)
Resent this to [facility]

## 2022-07-07 ENCOUNTER — Other Ambulatory Visit: Payer: Self-pay | Admitting: Internal Medicine

## 2022-07-13 ENCOUNTER — Ambulatory Visit (INDEPENDENT_AMBULATORY_CARE_PROVIDER_SITE_OTHER): Payer: Medicare HMO

## 2022-07-13 VITALS — Ht 63.0 in | Wt 200.0 lb

## 2022-07-13 DIAGNOSIS — Z0000 Encounter for general adult medical examination without abnormal findings: Secondary | ICD-10-CM

## 2022-07-13 NOTE — Progress Notes (Signed)
I connected with  [name] on 07/13/22 by a audio enabled telemedicine application and verified that I am speaking with the correct person using two identifiers.  Patient Location: Home  Provider Location: Office/Clinic  I discussed the limitations of evaluation and management by telemedicine. The patient expressed understanding and agreed to proceed.  Subjective:   [name] is a 82 y.o. female who presents for Medicare Annual (Subsequent) preventive examination.  Review of Systems     Cardiac Risk Factors include: advanced age (>78men, >59 women);diabetes mellitus;dyslipidemia;family history of premature cardiovascular disease;hypertension;obesity (BMI >30kg/m2);sedentary lifestyle     Objective:    Today's Vitals   07/13/22 1331  Weight: 200 lb (90.7 kg)  Height: 5\' 3"  (1.6 m)  PainSc: 0-No pain   Body mass index is 35.43 kg/m.     07/13/2022    1:33 PM 07/10/2021   10:56 AM 04/19/2020    2:00 PM 10/18/2019    1:13 PM 12/24/2015    3:04 PM 07/15/2015    9:54 AM 05/22/2015   10:04 AM  Advanced Directives  Does Patient Have a Medical Advance Directive? No Yes No No No No No  Type of Furniture conservator/restorer;Living will       Copy of Healthcare Power of Attorney in Chart?  No - copy requested       Would patient like information on creating a medical advance directive? No - Patient declined  No - Patient declined No - Patient declined No - patient declined information No - patient declined information No - patient declined information    Current Medications (verified) Outpatient Encounter Medications as of 07/13/2022  Medication Sig   albuterol (VENTOLIN HFA) 108 (90 Base) MCG/ACT inhaler Inhale 2 puffs into the lungs every 6 (six) hours as needed for wheezing or shortness of breath.   apixaban (ELIQUIS) 5 MG TABS tablet Take 1 tablet (5 mg total) by mouth 2 (two) times daily.   Blood Glucose Monitoring Suppl (TRUE METRIX METER) w/Device KIT  1 each by Does not apply route in the morning and at bedtime.   Colchicine (MITIGARE) 0.6 MG CAPS Take 1 capsule (0.6 mg total) by mouth daily as needed (gout flare).   diltiazem (CARDIZEM CD) 120 MG 24 hr capsule TAKE 1 CAP DAILY. TAKE W/DILTIAZEM 240MG  FOR 360MG /DAY. MAY TAKE 1 ADDITIONAL CAP AS NEEDED FOR BREAKTHROUGH AFIB   diltiazem (CARDIZEM CD) 240 MG 24 hr capsule TAKE 1 CAPSULE EVERY DAY IN ADDITION TO DILTIAZEM 120MG  FOR TOTAL OF 360MG  DAILY (APPOINTMENT IS NEEDED FOR REFILLS)   glucose blood (TRUE METRIX BLOOD GLUCOSE TEST) test strip Use to check blood sugars twice a day   hydrocortisone cream 1 % Apply 1 application  topically See admin instructions. 1 application under breast after showers and 1 application as needed for itching   Lancets (ACCU-CHEK SOFT TOUCH) lancets Use to check blood sugars twice day   losartan (COZAAR) 100 MG tablet TAKE 1 TABLET EVERY DAY   ondansetron (ZOFRAN) 4 MG tablet Take 1 tablet (4 mg total) by mouth every 8 (eight) hours as needed for nausea or vomiting.   pravastatin (PRAVACHOL) 20 MG tablet Take 1 tablet (20 mg total) by mouth daily.   sodium chloride (OCEAN) 0.65 % SOLN nasal spray Place 1 spray into both nostrils as needed for congestion.   traMADol (ULTRAM) 50 MG tablet Take 1 tablet (50 mg total) by mouth daily as needed.   triamcinolone ointment (KENALOG) 0.5 %  Apply 1 application. topically 2 (two) times daily.   No facility-administered encounter medications on file as of 07/13/2022.    Allergies (verified) Benazepril, Lopressor [metoprolol], Adhesive [tape], Morphine, Nifedipine, and Piroxicam   History: Past Medical History:  Diagnosis Date   Allergic rhinitis    Allergy    Anxiety    and panic attacks;pt states that she is claustrophobic   Asthma    Atrial fibrillation (HCC)    Used to take Coumadin-instructed to stop   Cataract    bilateral surgery to remove   Chronic back pain    hx buldging disc   Colon cancer (HCC) 2007    s/p surgery and chemo   Constipation    takes Colace prn   Diabetes mellitus    glimepiride   DJD (degenerative joint disease)    Fibromyalgia    but doesn't take any meds   Gastric ulcer    GERD (gastroesophageal reflux disease)    uses Tums prn   Gout    takes Colchicine prn   History of blood clots    right lung in early 90's   History of colon polyps    History of migraine headaches    last migraine 34yrs ago;Pt states she does get sinus headaches   History of PSVT (paroxysmal supraventricular tachycardia) no current problems   History of shingles    HLD (hyperlipidemia)    diet control - no meds   Hypertension    takes Micardis and Diltiazem daily   Osteoarthritis    Peptic ulcer disease    Peripheral edema    takes Furosemide every other day   Sleep apnea    doesn't use CPAP   Past Surgical History:  Procedure Laterality Date   ANKLE SURGERY     tumor-benign removed left ankle   ANTERIOR CERVICAL DECOMP/DISCECTOMY FUSION  01/12/2011   Procedure: ANTERIOR CERVICAL DECOMPRESSION/DISCECTOMY FUSION 2 LEVELS;  Surgeon: Kathaleen Maser Pool;  Location: [facility];  Service: Neurosurgery;  Laterality: Bilateral;  Cervical five-six, six-seven anterior cervical discectomy and fusion with allograft and plating   ANTERIOR CERVICAL DECOMP/DISCECTOMY FUSION  06/11/2011   Procedure: ANTERIOR CERVICAL DECOMPRESSION/DISCECTOMY FUSION 1 LEVEL/HARDWARE REMOVAL;  Surgeon: Temple Pacini, MD;  Location: [facility];  Service: Neurosurgery;  Laterality: N/A;  Cervical four-five anterior cervical decompression fusion with allograft and plating; Removal of Cervical five to seven plate   bilateral cataracts removed     caesarean section  1966/69/72   x 3   CARDIAC CATHETERIZATION  90's and 2005   CARPAL TUNNEL RELEASE Left 11/2013   cataract surgery  2010   COLECTOMY  2007   colon cancer   COLONOSCOPY     DILATION AND CURETTAGE OF UTERUS  02-19-11   & POLYP REMOVAL   GANGLION CYST EXCISION   > 83yrs ago   removed from left pointer finger   HEEL SPUR SURGERY Right 1992   x 2   HYSTEROSCOPY WITH D & C  02/19/2011   Procedure: DILATATION AND CURETTAGE /HYSTEROSCOPY;  Surgeon: Dara Lords, MD;  Location: [facility];  Service: Gynecology;  Laterality: N/A;  requests one hour   JOINT REPLACEMENT Left    knee   knee arthrosocpy     bil;couple of years before knee replacement   KNEE SURGERY     left TKA   LEFT HEART CATH AND CORONARY ANGIOGRAPHY N/A 04/21/2020   Procedure: LEFT HEART CATH AND CORONARY ANGIOGRAPHY;  Surgeon: Runell Gess, MD;  Location:  [facility];  Service: Cardiovascular;  Laterality: N/A;   LUMBAR DISC SURGERY  2008   port a cath placed  2008   port a cath removed  2008   pyelonidal cystectomy     at age 49   SHOULDER ARTHROSCOPY WITH SUBACROMIAL DECOMPRESSION AND OPEN ROTATOR C Right 03/16/2013   Procedure: RIGHT SHOULDER ARTHROSCOPY WITH SUBACROMIAL DECOMPRESSION AND OPEN ROTATOR CUFF REPAIR, OPEN DISTAL CLAVICLE  RESECTION POSSIBLE BICEP TENODESIS ;  Surgeon: Verlee Rossetti, MD;  Location: [facility] OR;  Service: Orthopedics;  Laterality: Right;   TRIGGER FINGER RELEASE Left    thumb, tendonitis, tumor removed   TUBAL LIGATION  1972   Family History  Problem Relation Age of Onset   Heart attack Mother    Stroke Mother    Hypertension Mother    Heart disease Father    Hypertension Sister    Kidney cancer Sister        left   Bladder Cancer Brother    Liver cancer Brother        spread to colon   Coronary artery disease Other    Anesthesia problems Neg Hx    Hypotension Neg Hx    Malignant hyperthermia Neg Hx    Pseudochol deficiency Neg Hx    Stomach cancer Neg Hx    Rectal cancer Neg Hx    Social History   Socioeconomic History   Marital status: Married    Spouse name: Not on file   Number of children: 3   Years of education: Not on file   Highest education level: Not on file  Occupational History   Occupation: retired  Tobacco  Use   Smoking status: Former    Packs/day: 1.50    Years: 30.00    Additional pack years: 0.00    Total pack years: 45.00    Types: Cigarettes    Quit date: 02/23/1991    Years since quitting: 31.4   Smokeless tobacco: Never  Vaping Use   Vaping Use: Never used  Substance and Sexual Activity   Alcohol use: No    Alcohol/week: 0.0 standard drinks of alcohol   Drug use: No   Sexual activity: Yes    Partners: Male    Birth control/protection: Post-menopausal  Other Topics Concern   Not on file  Social History Narrative   Regular exercise- no.   Social Determinants of Health   Financial Resource Strain: Low Risk  (07/13/2022)   Overall Financial Resource Strain (CARDIA)    Difficulty of Paying Living Expenses: Not hard at all  Food Insecurity: No Food Insecurity (07/13/2022)   Hunger Vital Sign    Worried About Running Out of Food in the Last Year: Never true    Ran Out of Food in the Last Year: Never true  Transportation Needs: No Transportation Needs (07/13/2022)   PRAPARE - Administrator, Civil Service (Medical): No    Lack of Transportation (Non-Medical): No  Physical Activity: Inactive (07/13/2022)   Exercise Vital Sign    Days of Exercise per Week: 0 days    Minutes of Exercise per Session: 0 min  Stress: No Stress Concern Present (07/13/2022)   [employer] of Occupational Health - Occupational Stress Questionnaire    Feeling of Stress : Not at all  Social Connections: Socially Integrated (07/13/2022)   Social Connection and Isolation Panel [NHANES]    Frequency of Communication with Friends and Family: More than three times a week  Frequency of Social Gatherings with Friends and Family: More than three times a week    Attends Religious Services: More than 4 times per year    Active Member of Clubs or Organizations: Yes    Attends Engineer, structural: More than 4 times per year    Marital Status: Married    Tobacco Counseling Counseling  given: Not Answered   Clinical Intake:  Pre-visit preparation completed: Yes  Pain : No/denies pain Pain Score: 0-No pain     BMI - recorded: 35.43 Nutritional Status: BMI > 30  Obese Nutritional Risks: None Diabetes: No  How often do you need to have someone help you when you read instructions, pamphlets, or other written materials from your doctor or pharmacy?: 1 - Never What is the last grade level you completed in school?: 8th grade Nutrition Risk Assessment:  Has the patient had any N/V/D within the last 2 months?  No  Does the patient have any non-healing wounds?  No  Has the patient had any unintentional weight loss or weight gain?  No   Diabetes:  Is the patient diabetic?  Yes  If diabetic, was a CBG obtained today?  No  Did the patient bring in their glucometer from home?  No  How often do you monitor your CBG's? Twice per day.   Financial Strains and Diabetes Management:  Are you having any financial strains with the device, your supplies or your medication? No .  Does the patient want to be seen by Chronic Care Management for management of their diabetes?  No  Would the patient like to be referred to a Nutritionist or for Diabetic Management?  No   Diabetic Exams:  Diabetic Eye Exam: Completed 01/16/2022 Diabetic Foot Exam: Completed 07/02/2022   Interpreter Needed?: No  Information entered by :: Susie Cassette, LPN.   Activities of Daily Living    07/13/2022    1:35 PM  In your present state of health, do you have any difficulty performing the following activities:  Hearing? 0  Vision? 0  Difficulty concentrating or making decisions? 0  Walking or climbing stairs? 0  Dressing or bathing? 0  Doing errands, shopping? 0  Preparing Food and eating ? N  Using the Toilet? N  In the past six months, have you accidently leaked urine? Y  Comment Nocturia  Do you have problems with loss of bowel control? N  Managing your Medications? N  Managing  your Finances? N  Housekeeping or managing your Housekeeping? N    Patient Care Team: Myrlene Broker, MD as PCP - General (Internal Medicine) Pricilla Riffle, MD as PCP - Cardiology (Cardiology) Pricilla Riffle, MD as Consulting Physician (Cardiology) Maris Berger, MD as Consulting Physician (Ophthalmology)  Indicate any recent Medical Services you may have received from other than [facility] providers in the past year (date may be approximate).     Assessment:   This is a routine wellness examination for Luvern.  Hearing/Vision screen Hearing Screening - Comments:: Denies hearing difficulties.   Vision Screening - Comments:: Wears rx glasses - up to date with routine eye exams with Maris Berger, MD.   Dietary issues and exercise activities discussed: Current Exercise Habits: The patient does not participate in regular exercise at present, Exercise limited by: orthopedic condition(s);respiratory conditions(s) (right knee pain)   Goals Addressed             This Visit's Progress    My goal for 2024 is to lose  weight by the end of the year so that I can have right knee replacement surgery.        Depression Screen    07/13/2022    1:34 PM 07/02/2022    2:21 PM 07/16/2021    9:56 AM 07/10/2021   10:57 AM 07/10/2021   10:55 AM 04/27/2021    4:29 PM 03/24/2021    1:39 PM  PHQ 2/9 Scores  PHQ - 2 Score 0 0 0 0 0 0 0  PHQ- 9 Score 0 0 0        Fall Risk    07/13/2022    1:34 PM 07/02/2022    2:21 PM 07/16/2021    9:56 AM 07/10/2021   10:57 AM 04/27/2021    4:28 PM  Fall Risk   Falls in the past year? 0 0 0 0 0  Number falls in past yr: 0 0 0 0 0  Injury with Fall? 0 0 0 0 0  Risk for fall due to : No Fall Risks No Fall Risks   No Fall Risks  Follow up Falls prevention discussed Falls evaluation completed  Falls evaluation completed Falls evaluation completed    FALL RISK PREVENTION PERTAINING TO THE HOME:  Any stairs in or around the home? No  If so, are there any  without handrails? No  Home free of loose throw rugs in walkways, pet beds, electrical cords, etc? Yes  Adequate lighting in your home to reduce risk of falls? Yes   ASSISTIVE DEVICES UTILIZED TO PREVENT FALLS:  Life alert? No  Use of a cane, walker or w/c? No  Grab bars in the bathroom? Yes  Shower chair or bench in shower? Yes  Elevated toilet seat or a handicapped toilet? Yes   TIMED UP AND GO:  Was the test performed? No . Telephonic Visit  Cognitive Function:        07/13/2022    1:43 PM  6CIT Screen  What Year? 0 points  What month? 0 points  What time? 0 points  Count back from 20 0 points  Months in reverse 0 points  Repeat phrase 0 points  Total Score 0 points    Immunizations Immunization History  Administered Date(s) Administered   PFIZER(Purple Top)SARS-COV-2 Vaccination 11/23/2019, 12/17/2019   Td 03/26/2003   Tdap 05/22/2014    TDAP status: Up to date  Flu Vaccine status: Declined, Education has been provided regarding the importance of this vaccine but patient still declined. Advised may receive this vaccine at local pharmacy or Health Dept. Aware to provide a copy of the vaccination record if obtained from local pharmacy or Health Dept. Verbalized acceptance and understanding.  Pneumococcal vaccine status: Declined,  Education has been provided regarding the importance of this vaccine but patient still declined. Advised may receive this vaccine at local pharmacy or Health Dept. Aware to provide a copy of the vaccination record if obtained from local pharmacy or Health Dept. Verbalized acceptance and understanding.   Covid-19 vaccine status: Completed vaccines  Qualifies for Shingles Vaccine? Yes   Zostavax completed No   Shingrix Completed?: No.    Education has been provided regarding the importance of this vaccine. Patient has been advised to call insurance company to determine out of pocket expense if they have not yet received this vaccine. Advised  may also receive vaccine at local pharmacy or Health Dept. Verbalized acceptance and understanding.  Screening Tests Health Maintenance  Topic Date Due   Pneumonia Vaccine 98+ Years old (  1 of 2 - PCV) Never done   Zoster Vaccines- Shingrix (1 of 2) Never done   DEXA SCAN  Never done   OPHTHALMOLOGY EXAM  12/31/2018   COVID-19 Vaccine (3 - Pfizer risk series) 07/18/2022 (Originally 01/14/2020)   COLONOSCOPY (Pts 45-68yrs Insurance coverage will need to be confirmed)  09/22/2022   INFLUENZA VACCINE  09/23/2022   HEMOGLOBIN A1C  01/02/2023   Diabetic kidney evaluation - eGFR measurement  07/02/2023   Diabetic kidney evaluation - Urine ACR  07/02/2023   FOOT EXAM  07/02/2023   Medicare Annual Wellness (AWV)  07/13/2023   DTaP/Tdap/Td (3 - Td or Tdap) 05/21/2024   HPV VACCINES  Aged Out    Health Maintenance  Health Maintenance Due  Topic Date Due   Pneumonia Vaccine 67+ Years old (1 of 2 - PCV) Never done   Zoster Vaccines- Shingrix (1 of 2) Never done   DEXA SCAN  Never done   OPHTHALMOLOGY EXAM  12/31/2018    Colorectal cancer screening: No longer required.   Mammogram status: No longer required due to patient denial.  Bone Density Scan: Never done/No record  Lung Cancer Screening: (Low Dose CT Chest recommended if Age 63-80 years, 30 pack-year currently smoking OR have quit w/in 15years.) does not qualify.   Lung Cancer Screening Referral: No  Additional Screening:  Hepatitis C Screening: does not qualify; Completed: No  Vision Screening: Recommended annual ophthalmology exams for early detection of glaucoma and other disorders of the eye. Is the patient up to date with their annual eye exam?  Yes  Who is the provider or what is the name of the office in which the patient attends annual eye exams? Maris Berger, MD. If pt is not established with a provider, would they like to be referred to a provider to establish care? No .   Dental Screening: Recommended annual  dental exams for proper oral hygiene  Community Resource Referral / Chronic Care Management: CRR required this visit?  No   CCM required this visit?  No      Plan:     I have personally reviewed and noted the following in the patient's chart:   Medical and social history Use of alcohol, tobacco or illicit drugs  Current medications and supplements including opioid prescriptions. Patient is not currently taking opioid prescriptions. Functional ability and status Nutritional status Physical activity Advanced directives List of other physicians Hospitalizations, surgeries, and ER visits in previous 12 months Vitals Screenings to include cognitive, depression, and falls Referrals and appointments  In addition, I have reviewed and discussed with patient certain preventive protocols, quality metrics, and best practice recommendations. A written personalized care plan for preventive services as well as general preventive health recommendations were provided to patient.     Mickeal Needy, LPN   10/21/5619   Nurse Notes: Normal cognitive status assessed by direct observation via telephone conversation by this Nurse Health Advisor. No abnormalities found.

## 2022-07-13 NOTE — Patient Instructions (Addendum)
[name] , Thank you for taking time to come for your Medicare Wellness Visit. I appreciate your ongoing commitment to your health goals. Please review the following plan we discussed and let me know if I can assist you in the future.   These are the goals we discussed:  Goals      My goal for 2024 is to lose weight by the end of the year so that I can have right knee replacement surgery.        This is a list of the screening recommended for you and due dates:  Health Maintenance  Topic Date Due   Pneumonia Vaccine (1 of 2 - PCV) Never done   Zoster (Shingles) Vaccine (1 of 2) Never done   DEXA scan (bone density measurement)  Never done   Eye exam for diabetics  12/31/2018   COVID-19 Vaccine (3 - Pfizer risk series) 07/18/2022*   Colon Cancer Screening  09/22/2022   Flu Shot  09/23/2022   Hemoglobin A1C  01/02/2023   Yearly kidney function blood test for diabetes  07/02/2023   Yearly kidney health urinalysis for diabetes  07/02/2023   Complete foot exam   07/02/2023   Medicare Annual Wellness Visit  07/13/2023   DTaP/Tdap/Td vaccine (3 - Td or Tdap) 05/21/2024   HPV Vaccine  Aged Out  *Topic was postponed. The date shown is not the original due date.    Advanced directives: No  Conditions/risks identified: Yes  Next appointment: Follow up in one year for your annual wellness visit.   Preventive Care 80 Years and Older, Female Preventive care refers to lifestyle choices and visits with your health care provider that can promote health and wellness. What does preventive care include? A yearly physical exam. This is also called an annual well check. Dental exams once or twice a year. Routine eye exams. Ask your health care provider how often you should have your eyes checked. Personal lifestyle choices, including: Daily care of your teeth and gums. Regular physical activity. Eating a healthy diet. Avoiding tobacco and drug use. Limiting alcohol use. Practicing safe  sex. Taking low-dose aspirin every day. Taking vitamin and mineral supplements as recommended by your health care provider. What happens during an annual well check? The services and screenings done by your health care provider during your annual well check will depend on your age, overall health, lifestyle risk factors, and family history of disease. Counseling  Your health care provider may ask you questions about your: Alcohol use. Tobacco use. Drug use. Emotional well-being. Home and relationship well-being. Sexual activity. Eating habits. History of falls. Memory and ability to understand (cognition). Work and work Astronomer. Reproductive health. Screening  You may have the following tests or measurements: Height, weight, and BMI. Blood pressure. Lipid and cholesterol levels. These may be checked every 5 years, or more frequently if you are over 52 years old. Skin check. Lung cancer screening. You may have this screening every year starting at age 28 if you have a 30-pack-year history of smoking and currently smoke or have quit within the past 15 years. Fecal occult blood test (FOBT) of the stool. You may have this test every year starting at age 18. Flexible sigmoidoscopy or colonoscopy. You may have a sigmoidoscopy every 5 years or a colonoscopy every 10 years starting at age 60. Hepatitis C blood test. Hepatitis B blood test. Sexually transmitted disease (STD) testing. Diabetes screening. This is done by checking your blood sugar (glucose) after you  have not eaten for a while (fasting). You may have this done every 1-3 years. Bone density scan. This is done to screen for osteoporosis. You may have this done starting at age 48. Mammogram. This may be done every 1-2 years. Talk to your health care provider about how often you should have regular mammograms. Talk with your health care provider about your test results, treatment options, and if necessary, the need for more  tests. Vaccines  Your health care provider may recommend certain vaccines, such as: Influenza vaccine. This is recommended every year. Tetanus, diphtheria, and acellular pertussis (Tdap, Td) vaccine. You may need a Td booster every 10 years. Zoster vaccine. You may need this after age 26. Pneumococcal 13-valent conjugate (PCV13) vaccine. One dose is recommended after age 50. Pneumococcal polysaccharide (PPSV23) vaccine. One dose is recommended after age 90. Talk to your health care provider about which screenings and vaccines you need and how often you need them. This information is not intended to replace advice given to you by your health care provider. Make sure you discuss any questions you have with your health care provider. Document Released: 03/07/2015 Document Revised: 10/29/2015 Document Reviewed: 12/10/2014 Elsevier Interactive Patient Education  2017 [employer].  Fall Prevention in the Home Falls can cause injuries. They can happen to people of all ages. There are many things you can do to make your home safe and to help prevent falls. What can I do on the outside of my home? Regularly fix the edges of walkways and driveways and fix any cracks. Remove anything that might make you trip as you walk through a door, such as a raised step or threshold. Trim any bushes or trees on the path to your home. Use bright outdoor lighting. Clear any walking paths of anything that might make someone trip, such as rocks or tools. Regularly check to see if handrails are loose or broken. Make sure that both sides of any steps have handrails. Any raised decks and porches should have guardrails on the edges. Have any leaves, snow, or ice cleared regularly. Use sand or salt on walking paths during winter. Clean up any spills in your garage right away. This includes oil or grease spills. What can I do in the bathroom? Use night lights. Install grab bars by the toilet and in the tub and shower.  Do not use towel bars as grab bars. Use non-skid mats or decals in the tub or shower. If you need to sit down in the shower, use a plastic, non-slip stool. Keep the floor dry. Clean up any water that spills on the floor as soon as it happens. Remove soap buildup in the tub or shower regularly. Attach bath mats securely with double-sided non-slip rug tape. Do not have throw rugs and other things on the floor that can make you trip. What can I do in the bedroom? Use night lights. Make sure that you have a light by your bed that is easy to reach. Do not use any sheets or blankets that are too big for your bed. They should not hang down onto the floor. Have a firm chair that has side arms. You can use this for support while you get dressed. Do not have throw rugs and other things on the floor that can make you trip. What can I do in the kitchen? Clean up any spills right away. Avoid walking on wet floors. Keep items that you use a lot in easy-to-reach places. If you need  to reach something above you, use a strong step stool that has a grab bar. Keep electrical cords out of the way. Do not use floor polish or wax that makes floors slippery. If you must use wax, use non-skid floor wax. Do not have throw rugs and other things on the floor that can make you trip. What can I do with my stairs? Do not leave any items on the stairs. Make sure that there are handrails on both sides of the stairs and use them. Fix handrails that are broken or loose. Make sure that handrails are as long as the stairways. Check any carpeting to make sure that it is firmly attached to the stairs. Fix any carpet that is loose or worn. Avoid having throw rugs at the top or bottom of the stairs. If you do have throw rugs, attach them to the floor with carpet tape. Make sure that you have a light switch at the top of the stairs and the bottom of the stairs. If you do not have them, ask someone to add them for you. What else  can I do to help prevent falls? Wear shoes that: Do not have high heels. Have rubber bottoms. Are comfortable and fit you well. Are closed at the toe. Do not wear sandals. If you use a stepladder: Make sure that it is fully opened. Do not climb a closed stepladder. Make sure that both sides of the stepladder are locked into place. Ask someone to hold it for you, if possible. Clearly mark and make sure that you can see: Any grab bars or handrails. First and last steps. Where the edge of each step is. Use tools that help you move around (mobility aids) if they are needed. These include: Canes. Walkers. Scooters. Crutches. Turn on the lights when you go into a dark area. Replace any light bulbs as soon as they burn out. Set up your furniture so you have a clear path. Avoid moving your furniture around. If any of your floors are uneven, fix them. If there are any pets around you, be aware of where they are. Review your medicines with your doctor. Some medicines can make you feel dizzy. This can increase your chance of falling. Ask your doctor what other things that you can do to help prevent falls. This information is not intended to replace advice given to you by your health care provider. Make sure you discuss any questions you have with your health care provider. Document Released: 12/05/2008 Document Revised: 07/17/2015 Document Reviewed: 03/15/2014 Elsevier Interactive Patient Education  2017 [employer].

## 2022-08-16 ENCOUNTER — Other Ambulatory Visit: Payer: Self-pay | Admitting: Internal Medicine

## 2022-08-20 DIAGNOSIS — H52203 Unspecified astigmatism, bilateral: Secondary | ICD-10-CM | POA: Diagnosis not present

## 2022-08-20 DIAGNOSIS — H35371 Puckering of macula, right eye: Secondary | ICD-10-CM | POA: Diagnosis not present

## 2022-08-20 DIAGNOSIS — Z961 Presence of intraocular lens: Secondary | ICD-10-CM | POA: Diagnosis not present

## 2022-08-28 ENCOUNTER — Other Ambulatory Visit: Payer: Self-pay | Admitting: Internal Medicine

## 2022-09-10 DIAGNOSIS — M1711 Unilateral primary osteoarthritis, right knee: Secondary | ICD-10-CM | POA: Diagnosis not present

## 2022-09-10 DIAGNOSIS — Z96652 Presence of left artificial knee joint: Secondary | ICD-10-CM | POA: Diagnosis not present

## 2022-09-10 DIAGNOSIS — M25561 Pain in right knee: Secondary | ICD-10-CM | POA: Diagnosis not present

## 2022-09-14 ENCOUNTER — Telehealth: Payer: Self-pay | Admitting: Internal Medicine

## 2022-09-14 ENCOUNTER — Telehealth: Payer: Self-pay | Admitting: *Deleted

## 2022-09-14 NOTE — Telephone Encounter (Signed)
We have received surgical clearance forms from [facility] and they have been placed in the provider box.   Upon completion please fax to: [phone]

## 2022-09-14 NOTE — Telephone Encounter (Signed)
Pt had called our office and wanted to let us know that she will be having surgery with Dr. Ferd Hibbs in 10/2022 and the surgery scheduler will fax over a clearance request to our office.

## 2022-09-14 NOTE — Telephone Encounter (Signed)
   Name: [name]  DOB: January 11, 1941  MRN: [id number]  Primary Cardiologist: Dietrich Pates, MD  Chart reviewed as part of pre-operative protocol coverage. Because of [name]'s past medical history and time since last visit, she will require a follow-up in-office visit in order to better assess preoperative cardiovascular risk.  Pre-op covering staff: - Please schedule appointment and call patient to inform them. If patient already had an upcoming appointment within acceptable timeframe, please add "pre-op clearance" to the appointment notes so provider is aware. - Please contact requesting surgeon's office via preferred method (i.e, phone, fax) to inform them of need for appointment prior to surgery.  This message will also be routed to pharmacy pool for input on holding Eliquis as requested below so that this information is available to the clearing provider at time of patient's appointment.   Joylene Grapes, NP  09/14/2022, 12:32 PM

## 2022-09-14 NOTE — Telephone Encounter (Signed)
S/w pt scheduled appt with Jari Favre, PA on Aug 8 @ 10:05.  Will remove from pre op pool.

## 2022-09-14 NOTE — Telephone Encounter (Signed)
Patient with diagnosis of afib on Eliquis for anticoagulation.    Procedure: TKA Date of procedure: 11/02/22  CHA2DS2-VASc Score = 5  This indicates a 7.2% annual risk of stroke. The patient's score is based upon: CHF History: 0 HTN History: 1 Diabetes History: 1 Stroke History: 0 Vascular Disease History: 0 Age Score: 2 Gender Score: 1   CrCl 20mL/min Platelet count 259K  Per office protocol, patient can hold Eliquis for 3 days prior to procedure as long as no new clinical concerns are noted at upcoming office visit.  **This guidance is not considered finalized until pre-operative APP has relayed final recommendations.**

## 2022-09-14 NOTE — Telephone Encounter (Signed)
   Pre-operative Risk Assessment    Patient Name: [name]  DOB: 1940-06-08 MRN: [id number]      Request for Surgical Clearance    Procedure:   Right Total Knee Arthroplasty  Date of Surgery:  Clearance 11/02/22                                 Surgeon:  Dr. Durene Romans Surgeon's Group or Practice Name:  [facility] Phone number:  [phone] Fax number:  [phone]   Type of Clearance Requested:   - Medical    Type of Anesthesia:  Spinal   Additional requests/questions:    Signed, Emmit Pomfret   09/14/2022, 10:50 AM

## 2022-09-16 NOTE — Telephone Encounter (Signed)
Forms received, placed in providers box for signature

## 2022-09-17 NOTE — Telephone Encounter (Signed)
Called pt inform her she needed office visit. Pt states she has an appt sch for 10/05/22.Marland KitchenRaechel Chute

## 2022-09-17 NOTE — Telephone Encounter (Signed)
Needs visit with EKG

## 2022-09-22 ENCOUNTER — Encounter (INDEPENDENT_AMBULATORY_CARE_PROVIDER_SITE_OTHER): Payer: Self-pay

## 2022-09-29 NOTE — Progress Notes (Unsigned)
Cardiology Office Note:  .   Date:  09/30/2022  ID:  [name], DOB [birth year], MRN [id number] PCP: Myrlene Broker, MD  [facility] Providers Cardiologist:  Dietrich Pates, MD {  History of Present Illness: .   [name] is a 82 y.o. female with a past medical history of paroxysmal atrial fibrillation (declined anticoagulation), aortic stenosis, T2 DM (diet controlled), HTN, HLD, asthma, OSA, remote PE, PUD here for follow-up and preop clearance.  Last seen by Dr. Tenny Craw 03/16/2021.  History admission February 2022 with atrial fibrillation and RVR, troponin elevated.  Echo 2/22 showed normal biventricular function, aortic valve sclerosis/calcification but no stenosis.  L HC on 04/21/2020 showed normal coronary arteries.  Started on flecainide 50 mg twice a day to maintain normal sinus rhythm as well as Eliquis.  However, she did not take either medicine.  Seen by Dr. Tenny Craw 11/22.  When she was last seen in the clinic she reports that she had an A-fib episode for about 15 hours where her heart rate got up to the 130s.  Was asymptomatic during the episode.  Ended up taking her diltiazem 360 mg daily.  States that she did not tolerate warfarin in the past and still very fatigued on medication.  Willing to try Eliquis.  Reported chronic dyspnea and denied chest pain at that time.  Today, she tells me she has not had any cardiovascular symptoms.  No shortness of breath or chest pain.  She had her left knee done about 21 years ago and now needs a right total knee.  It is currently filled with fluid.  She is off her Eliquis because it causes her a lot of fatigue.  She does not want to be on blood thinners due to this.  I explained to her that she has a greater than 7% chance of stroke annually.  She understands this risk.  She has met minimum METs on the DASI.  Overall, low risk for upcoming surgery.  Reports no shortness of breath nor dyspnea on exertion. Reports no chest pain, pressure, or  tightness. No edema, orthopnea, PND. Reports no palpitations.   ROS: Pertinent ROS in HPI  Studies Reviewed: [name]   EKG Interpretation Date/Time:  Thursday September 30 2022 10:03:22 EDT Ventricular Rate:  70 PR Interval:  188 QRS Duration:  76 QT Interval:  380 QTC Calculation: 410 R Axis:   -45  Text Interpretation: Normal sinus rhythm Left anterior fascicular block Cannot rule out Anterior infarct , age undetermined When compared with ECG of 19-Apr-2020 17:42, Minimal criteria for Anterior infarct are now Present Confirmed by Jari Favre [phone]) on 09/30/2022 10:17:50 AM     Risk Assessment/Calculations:    CHA2DS2-VASc Score = 5   This indicates a 7.2% annual risk of stroke. The patient's score is based upon: CHF History: 0 HTN History: 1 Diabetes History: 1 Stroke History: 0 Vascular Disease History: 0 Age Score: 2 Gender Score: 1      Physical Exam:   VS:  BP 120/64   Pulse 70   Ht 5\' 3"  (1.6 m)   Wt 196 lb 6.4 oz (89.1 kg)   LMP 12/30/2000   SpO2 96%   BMI 34.79 kg/m    Wt Readings from Last 3 Encounters:  09/30/22 196 lb 6.4 oz (89.1 kg)  07/13/22 200 lb (90.7 kg)  07/02/22 200 lb (90.7 kg)    GEN: Well nourished, well developed in no acute distress NECK: No JVD; No carotid bruits  CARDIAC: RRR, no murmurs, rubs, gallops RESPIRATORY:  Clear to auscultation without rales, wheezing or rhonchi  ABDOMEN: Soft, non-tender, non-distended EXTREMITIES:  No edema; No deformity   ASSESSMENT AND PLAN: .   1.  Preop clearance  Ms. Rendleman's perioperative risk of a major cardiac event is 0.4% according to the Revised Cardiac Risk Index (RCRI).  Therefore, she is at low risk for perioperative complications.   Her functional capacity is good at 5.07 METs according to the Duke Activity Status Index (DASI). Recommendations: According to ACC/AHA guidelines, no further cardiovascular testing needed.  The patient may proceed to surgery at acceptable risk.     2. PAF -she takes  360 of diltiazem daily and an extra 120 of diltiazem when she is in A-fib as well as a baby aspirin -off Eliquis due to fatigue  -watchman candidate?-Discussed with patient today and appears uninterested  3.  HTN -Blood pressure well-controlled, 120/64 -Would continue current medication regimen -Continue low-sodium, heart healthy diet  4.  HLD   -Recent LDL 132 HDL 60 triglycerides 140. -No previous history of coronary artery disease goal will be less than 100 -She is on pravastatin which is prescribed by her PCP   Dispo: Follow-up in 6 months with Dr. Tenny Craw  Signed, Sharlene Dory, PA-C

## 2022-09-30 ENCOUNTER — Ambulatory Visit: Payer: Medicare HMO | Attending: Physician Assistant | Admitting: Physician Assistant

## 2022-09-30 ENCOUNTER — Encounter: Payer: Self-pay | Admitting: Physician Assistant

## 2022-09-30 VITALS — BP 120/64 | HR 70 | Ht 63.0 in | Wt 196.4 lb

## 2022-09-30 DIAGNOSIS — I480 Paroxysmal atrial fibrillation: Secondary | ICD-10-CM

## 2022-09-30 DIAGNOSIS — E785 Hyperlipidemia, unspecified: Secondary | ICD-10-CM | POA: Diagnosis not present

## 2022-09-30 DIAGNOSIS — I10 Essential (primary) hypertension: Secondary | ICD-10-CM | POA: Diagnosis not present

## 2022-09-30 NOTE — Patient Instructions (Signed)
Medication Instructions:  Your physician recommends that you continue on your current medications as directed. Please refer to the Current Medication list given to you today.  *If you need a refill on your cardiac medications before your next appointment, please call your pharmacy*  Follow-Up: At [facility], you and your health needs are our priority.  As part of our continuing mission to provide you with exceptional heart care, we have created designated Provider Care Teams.  These Care Teams include your primary Cardiologist (physician) and Advanced Practice Providers (APPs -  Physician Assistants and Nurse Practitioners) who all work together to provide you with the care you need, when you need it.   Your next appointment:   6 month(s)  Provider:   Dietrich Pates, MD

## 2022-10-05 ENCOUNTER — Ambulatory Visit (INDEPENDENT_AMBULATORY_CARE_PROVIDER_SITE_OTHER): Payer: Medicare HMO | Admitting: Internal Medicine

## 2022-10-05 ENCOUNTER — Encounter: Payer: Self-pay | Admitting: Internal Medicine

## 2022-10-05 VITALS — BP 138/80 | HR 74 | Temp 98.3°F | Ht 63.0 in | Wt 195.0 lb

## 2022-10-05 DIAGNOSIS — I10 Essential (primary) hypertension: Secondary | ICD-10-CM | POA: Diagnosis not present

## 2022-10-05 DIAGNOSIS — Z01818 Encounter for other preprocedural examination: Secondary | ICD-10-CM | POA: Diagnosis not present

## 2022-10-05 DIAGNOSIS — J4520 Mild intermittent asthma, uncomplicated: Secondary | ICD-10-CM | POA: Diagnosis not present

## 2022-10-05 NOTE — Assessment & Plan Note (Signed)
No flare today and uses inhaler as needed only. Okay to proceed without further testing to surgery.

## 2022-10-05 NOTE — Assessment & Plan Note (Signed)
Stable and at goal. Continue same diltiazem 360 mg daily and losartan 100 mg daily

## 2022-10-05 NOTE — Progress Notes (Signed)
   Subjective:   Patient ID: [name], female    DOB: 20-Oct-1940, 82 y.o.   MRN: [id number]  HPI The patient is an 82 YO female coming in for pre-op assessment. Right knee replacement in a month or so. No chest pains or change in SOB.   PMH, [facility], social history reviewed and updated  Review of Systems  Constitutional: Negative.   HENT: Negative.    Eyes: Negative.   Respiratory:  Negative for cough, chest tightness and shortness of breath.   Cardiovascular:  Negative for chest pain, palpitations and leg swelling.  Gastrointestinal:  Negative for abdominal distention, abdominal pain, constipation, diarrhea, nausea and vomiting.  Musculoskeletal:  Positive for arthralgias and gait problem.  Skin: Negative.   Psychiatric/Behavioral: Negative.      Objective:  Physical Exam Constitutional:      Appearance: She is well-developed.  HENT:     Head: Normocephalic and atraumatic.  Cardiovascular:     Rate and Rhythm: Normal rate and regular rhythm.  Pulmonary:     Effort: Pulmonary effort is normal. No respiratory distress.     Breath sounds: Normal breath sounds. No wheezing or rales.  Abdominal:     General: Bowel sounds are normal. There is no distension.     Palpations: Abdomen is soft.     Tenderness: There is no abdominal tenderness. There is no rebound.  Musculoskeletal:        General: Tenderness present.     Cervical back: Normal range of motion.  Skin:    General: Skin is warm and dry.  Neurological:     Mental Status: She is alert and oriented to person, place, and time.     Coordination: Coordination abnormal.     Vitals:   10/05/22 1048 10/05/22 1702  BP: (!) 140/80 138/80  Pulse: 74   Temp: 98.3 F (36.8 C)   TempSrc: Oral   SpO2: 97%   Weight: 195 lb (88.5 kg)   Height: 5\' 3"  (1.6 m)     Assessment & Plan:  Visit time 20 minutes in face to face communication with patient and coordination of care, additional 10 minutes spent in record review,  coordination or care, ordering tests, communicating/referring to other healthcare professionals, documenting in medical records all on the same day of the visit for total time 30 minutes spent on the visit.

## 2022-10-05 NOTE — Assessment & Plan Note (Signed)
EKG reviewed and without changes. No new chest pains or SOB. Okay to proceed without further testing. Reviewed need for bowel regimen with post op opioids.

## 2022-10-14 DIAGNOSIS — Z0100 Encounter for examination of eyes and vision without abnormal findings: Secondary | ICD-10-CM | POA: Diagnosis not present

## 2022-10-18 DIAGNOSIS — M1711 Unilateral primary osteoarthritis, right knee: Secondary | ICD-10-CM | POA: Diagnosis not present

## 2022-10-18 DIAGNOSIS — M25561 Pain in right knee: Secondary | ICD-10-CM | POA: Diagnosis not present

## 2022-10-18 DIAGNOSIS — M25661 Stiffness of right knee, not elsewhere classified: Secondary | ICD-10-CM | POA: Diagnosis not present

## 2022-10-20 NOTE — Patient Instructions (Signed)
DUE TO COVID-19 ONLY TWO VISITORS  (aged 80 and older)  ARE ALLOWED TO COME WITH YOU AND STAY IN THE WAITING ROOM ONLY DURING PRE OP AND PROCEDURE.   **NO VISITORS ARE ALLOWED IN THE SHORT STAY AREA OR RECOVERY ROOM!!**  IF YOU WILL BE ADMITTED INTO THE HOSPITAL YOU ARE ALLOWED ONLY FOUR SUPPORT PEOPLE DURING VISITATION HOURS ONLY (7 AM -8PM)   The support person(s) must pass our screening, gel in and out, and wear a mask at all times, including in the patient's room. Patients must also wear a mask when staff or their support person are in the room. Visitors GUEST BADGE MUST BE WORN VISIBLY  One adult visitor may remain with you overnight and MUST be in the room by 8 P.M.     Your procedure is scheduled on: 11/02/22   Report to [facility] Main Entrance    Report to admitting at : 11:50 AM   Call this number if you have problems the morning of surgery [phone]   Do not eat food :After Midnight.   After Midnight you may have the following liquids until : 11:00 AM DAY OF SURGERY  Water Black Coffee (sugar ok, NO MILK/CREAM OR CREAMERS)  Tea (sugar ok, NO MILK/CREAM OR CREAMERS) regular and decaf                             Plain Jell-O (NO RED)                                           Fruit ices (not with fruit pulp, NO RED)                                     Popsicles (NO RED)                                                                  Juice: apple, WHITE grape, WHITE cranberry Sports drinks like Gatorade (NO RED)    The day of surgery:  Drink ONE (1) Pre-Surgery Clear G2 at : 11:00 AM the morning of surgery. Drink in one sitting. Do not sip.  This drink was given to you during your hospital  pre-op appointment visit. Nothing else to drink after completing the  Pre-Surgery Clear Ensure or G2.          If you have questions, please contact your surgeon's office.  FOLLOW ANY ADDITIONAL PRE OP INSTRUCTIONS YOU RECEIVED FROM YOUR SURGEON'S OFFICE!!!   Oral  Hygiene is also important to reduce your risk of infection.                                    Remember - BRUSH YOUR TEETH THE MORNING OF SURGERY WITH YOUR REGULAR TOOTHPASTE  DENTURES WILL BE REMOVED PRIOR TO SURGERY PLEASE DO NOT APPLY "Poly grip" OR ADHESIVES!!!   Do NOT smoke after Midnight   Take these medicines the morning of  surgery with A SIP OF WATER: diltiazem,colchicine.Use inhalers as usual.Tramadol as needed.  DO NOT TAKE ANY ORAL DIABETIC MEDICATIONS DAY OF YOUR SURGERY  Bring CPAP mask and tubing day of surgery.                              You may not have any metal on your body including hair pins, jewelry, and body piercing             Do not wear make-up, lotions, powders, perfumes/cologne, or deodorant  Do not wear nail polish including gel and S&S, artificial/acrylic nails, or any other type of covering on natural nails including finger and toenails. If you have artificial nails, gel coating, etc. that needs to be removed by a nail salon please have this removed prior to surgery or surgery may need to be canceled/ delayed if the surgeon/ anesthesia feels like they are unable to be safely monitored.   Do not shave  48 hours prior to surgery.    Do not bring valuables to the hospital. [facility] IS NOT             RESPONSIBLE   FOR VALUABLES.   Contacts, glasses, or bridgework may not be worn into surgery.   Bring small overnight bag day of surgery.   DO NOT BRING YOUR HOME MEDICATIONS TO THE HOSPITAL. PHARMACY WILL DISPENSE MEDICATIONS LISTED ON YOUR MEDICATION LIST TO YOU DURING YOUR ADMISSION IN THE HOSPITAL!    Patients discharged on the day of surgery will not be allowed to drive home.  Someone NEEDS to stay with you for the first 24 hours after anesthesia.   Special Instructions: Bring a copy of your healthcare power of attorney and living will documents         the day of surgery if you haven't scanned them before.              Please read over the  following fact sheets you were given: IF YOU HAVE QUESTIONS ABOUT YOUR PRE-OP INSTRUCTIONS PLEASE CALL [phone]      Pre-operative 5 CHG Bath Instructions   You can play a key role in reducing the risk of infection after surgery. Your skin needs to be as free of germs as possible. You can reduce the number of germs on your skin by washing with CHG (chlorhexidine gluconate) soap before surgery. CHG is an antiseptic soap that kills germs and continues to kill germs even after washing.   DO NOT use if you have an allergy to chlorhexidine/CHG or antibacterial soaps. If your skin becomes reddened or irritated, stop using the CHG and notify one of our RNs at : [phone].   Please shower with the CHG soap starting 4 days before surgery using the following schedule:     Please keep in mind the following:  DO NOT shave, including legs and underarms, starting the day of your first shower.   You may shave your face at any point before/day of surgery.  Place clean sheets on your bed the day you start using CHG soap. Use a clean washcloth (not used since being washed) for each shower. DO NOT sleep with pets once you start using the CHG.   CHG Shower Instructions:  If you choose to wash your hair and private area, wash first with your normal shampoo/soap.  After you use shampoo/soap, rinse your hair and body thoroughly to remove shampoo/soap residue.  Turn the water OFF and apply about 3 tablespoons (45 ml) of CHG soap to a CLEAN washcloth.  Apply CHG soap ONLY FROM YOUR NECK DOWN TO YOUR TOES (washing for 3-5 minutes)  DO NOT use CHG soap on face, private areas, open wounds, or sores.  Pay special attention to the area where your surgery is being performed.  If you are having back surgery, having someone wash your back for you may be helpful. Wait 2 minutes after CHG soap is applied, then you may rinse off the CHG soap.  Pat dry with a clean towel  Put on clean clothes/pajamas   If you  choose to wear lotion, please use ONLY the CHG-compatible lotions on the back of this paper.     Additional instructions for the day of surgery: DO NOT APPLY any lotions, deodorants, cologne, or perfumes.   Put on clean/comfortable clothes.  Brush your teeth.  Ask your nurse before applying any prescription medications to the skin.    CHG Compatible Lotions   Aveeno Moisturizing lotion  Cetaphil Moisturizing Cream  Cetaphil Moisturizing Lotion  Clairol Herbal Essence Moisturizing Lotion, Dry Skin  Clairol Herbal Essence Moisturizing Lotion, Extra Dry Skin  Clairol Herbal Essence Moisturizing Lotion, Normal Skin  Curel Age Defying Therapeutic Moisturizing Lotion with Alpha Hydroxy  Curel Extreme Care Body Lotion  Curel Soothing Hands Moisturizing Hand Lotion  Curel Therapeutic Moisturizing Cream, Fragrance-Free  Curel Therapeutic Moisturizing Lotion, Fragrance-Free  Curel Therapeutic Moisturizing Lotion, Original Formula  Eucerin Daily Replenishing Lotion  Eucerin Dry Skin Therapy Plus Alpha Hydroxy Crme  Eucerin Dry Skin Therapy Plus Alpha Hydroxy Lotion  Eucerin Original Crme  Eucerin Original Lotion  Eucerin Plus Crme Eucerin Plus Lotion  Eucerin TriLipid Replenishing Lotion  Keri Anti-Bacterial Hand Lotion  Keri Deep Conditioning Original Lotion Dry Skin Formula Softly Scented  Keri Deep Conditioning Original Lotion, Fragrance Free Sensitive Skin Formula  Keri Lotion Fast Absorbing Fragrance Free Sensitive Skin Formula  Keri Lotion Fast Absorbing Softly Scented Dry Skin Formula  Keri Original Lotion  Keri Skin Renewal Lotion Keri Silky Smooth Lotion  Keri Silky Smooth Sensitive Skin Lotion  Nivea Body Creamy Conditioning Oil  Nivea Body Extra Enriched Lotion  Nivea Body Original Lotion  Nivea Body Sheer Moisturizing Lotion Nivea Crme  Nivea Skin Firming Lotion  NutraDerm 30 Skin Lotion  NutraDerm Skin Lotion  NutraDerm Therapeutic Skin Cream  NutraDerm  Therapeutic Skin Lotion  ProShield Protective Hand Cream  Provon moisturizing lotion   Incentive Spirometer  An incentive spirometer is a tool that can help keep your lungs clear and active. This tool measures how well you are filling your lungs with each breath. Taking long deep breaths may help reverse or decrease the chance of developing breathing (pulmonary) problems (especially infection) following: A long period of time when you are unable to move or be active. BEFORE THE PROCEDURE  If the spirometer includes an indicator to show your best effort, your nurse or respiratory therapist will set it to a desired goal. If possible, sit up straight or lean slightly forward. Try not to slouch. Hold the incentive spirometer in an upright position. INSTRUCTIONS FOR USE  Sit on the edge of your bed if possible, or sit up as far as you can in bed or on a chair. Hold the incentive spirometer in an upright position. Breathe out normally. Place the mouthpiece in your mouth and seal your lips tightly around it. Breathe in slowly and as deeply as possible, raising the piston  or the ball toward the top of the column. Hold your breath for 3-5 seconds or for as long as possible. Allow the piston or ball to fall to the bottom of the column. Remove the mouthpiece from your mouth and breathe out normally. Rest for a few seconds and repeat Steps 1 through 7 at least 10 times every 1-2 hours when you are awake. Take your time and take a few normal breaths between deep breaths. The spirometer may include an indicator to show your best effort. Use the indicator as a goal to work toward during each repetition. After each set of 10 deep breaths, practice coughing to be sure your lungs are clear. If you have an incision (the cut made at the time of surgery), support your incision when coughing by placing a pillow or rolled up towels firmly against it. Once you are able to get out of bed, walk around indoors and cough  well. You may stop using the incentive spirometer when instructed by your caregiver.  RISKS AND COMPLICATIONS Take your time so you do not get dizzy or light-headed. If you are in pain, you may need to take or ask for pain medication before doing incentive spirometry. It is harder to take a deep breath if you are having pain. AFTER USE Rest and breathe slowly and easily. It can be helpful to keep track of a log of your progress. Your caregiver can provide you with a simple table to help with this. If you are using the spirometer at home, follow these instructions: SEEK MEDICAL CARE IF:  You are having difficultly using the spirometer. You have trouble using the spirometer as often as instructed. Your pain medication is not giving enough relief while using the spirometer. You develop fever of 100.5 F (38.1 C) or higher. SEEK IMMEDIATE MEDICAL CARE IF:  You cough up bloody sputum that had not been present before. You develop fever of 102 F (38.9 C) or greater. You develop worsening pain at or near the incision site. MAKE SURE YOU:  Understand these instructions. Will watch your condition. Will get help right away if you are not doing well or get worse. Document Released: 06/21/2006 Document Revised: 05/03/2011 Document Reviewed: 08/22/2006 [facility] [employer] Patient Information 2014 [place], [place].   ________________________________________________________________________

## 2022-10-21 ENCOUNTER — Other Ambulatory Visit: Payer: Self-pay

## 2022-10-21 ENCOUNTER — Encounter (HOSPITAL_COMMUNITY): Payer: Self-pay

## 2022-10-21 ENCOUNTER — Encounter (HOSPITAL_COMMUNITY)
Admission: RE | Admit: 2022-10-21 | Discharge: 2022-10-21 | Disposition: A | Discharge status: Home / Self Care | Payer: Medicare HMO | Source: Ambulatory Visit | Attending: Orthopedic Surgery | Admitting: Orthopedic Surgery

## 2022-10-21 VITALS — BP 163/71 | HR 64 | Temp 98.0°F | Ht 63.0 in | Wt 196.0 lb

## 2022-10-21 DIAGNOSIS — E785 Hyperlipidemia, unspecified: Secondary | ICD-10-CM | POA: Diagnosis not present

## 2022-10-21 DIAGNOSIS — Z01818 Encounter for other preprocedural examination: Secondary | ICD-10-CM | POA: Diagnosis present

## 2022-10-21 DIAGNOSIS — Z01812 Encounter for preprocedural laboratory examination: Secondary | ICD-10-CM | POA: Diagnosis not present

## 2022-10-21 DIAGNOSIS — E1169 Type 2 diabetes mellitus with other specified complication: Secondary | ICD-10-CM | POA: Insufficient documentation

## 2022-10-21 DIAGNOSIS — I10 Essential (primary) hypertension: Secondary | ICD-10-CM | POA: Insufficient documentation

## 2022-10-21 HISTORY — DX: Cardiac arrhythmia, unspecified: I49.9

## 2022-10-21 HISTORY — DX: Cardiac murmur, unspecified: R01.1

## 2022-10-21 HISTORY — DX: Dyspnea, unspecified: R06.00

## 2022-10-21 LAB — BASIC METABOLIC PANEL
Anion gap: 8 (ref 5–15)
BUN: 35 mg/dL — ABNORMAL HIGH (ref 8–23)
CO2: 23 mmol/L (ref 22–32)
Calcium: 9 mg/dL (ref 8.9–10.3)
Chloride: 107 mmol/L (ref 98–111)
Creatinine, Ser: 1.31 mg/dL — ABNORMAL HIGH (ref 0.44–1.00)
GFR, Estimated: 41 mL/min — ABNORMAL LOW (ref >60)
Glucose, Bld: 178 mg/dL — ABNORMAL HIGH (ref 70–99)
Potassium: 4.1 mmol/L (ref 3.5–5.1)
Sodium: 138 mmol/L (ref 135–145)

## 2022-10-21 LAB — GLUCOSE, CAPILLARY: Glucose-Capillary: 125 mg/dL — ABNORMAL HIGH (ref 70–99)

## 2022-10-21 LAB — SURGICAL PCR SCREEN
MRSA, PCR: NEGATIVE
Staphylococcus aureus: NEGATIVE

## 2022-10-21 LAB — CBC
HCT: 40.7 % (ref 36.0–46.0)
Hemoglobin: 13.3 g/dL (ref 12.0–15.0)
MCH: 29.6 pg (ref 26.0–34.0)
MCHC: 32.7 g/dL (ref 30.0–36.0)
MCV: 90.4 fL (ref 80.0–100.0)
Platelets: 238 10*3/uL (ref 150–400)
RBC: 4.5 MIL/uL (ref 3.87–5.11)
RDW: 13.3 % (ref 11.5–15.5)
WBC: 7.9 10*3/uL (ref 4.0–10.5)
nRBC: 0 % (ref 0.0–0.2)

## 2022-10-21 LAB — HEMOGLOBIN A1C
Hgb A1c MFr Bld: 6.3 % — ABNORMAL HIGH (ref 4.8–5.6)
Mean Plasma Glucose: 134.11 mg/dL

## 2022-10-21 MED ORDER — LOSARTAN POTASSIUM 100 MG PO TABS: 100.0000 mg | ORAL_TABLET | Freq: Every day | ORAL | 3 refills | Status: DC

## 2022-10-21 NOTE — Progress Notes (Signed)
For Short Stay: COVID SWAB appointment date:  Bowel Prep reminder:   For Anesthesia: PCP - Dr. Hillard Danker. LOV: 10/05/22 Cardiologist - Dr. Dietrich Pates. Clearance: Jari Favre: PA: 09/30/22  Chest x-ray -  EKG - 09/30/22 Stress Test -  ECHO - 04/19/20 Cardiac Cath - 04/21/20 Pacemaker/ICD device last checked: Pacemaker orders received: Device Rep notified:  Spinal Cord Stimulator:  Sleep Study - Yes CPAP - NO  Fasting Blood Sugar - N/A Checks Blood Sugar ___3__ times a week. Date and result of last Hgb A1c-  Last dose of GLP1 agonist- N/A GLP1 instructions:   Last dose of SGLT-2 inhibitors- N/A SGLT-2 instructions:   Blood Thinner Instructions: Aspirin Instructions: Last Dose:  Activity level: Can go up a flight of stairs and activities of daily living without stopping and without chest pain and/or shortness of breath   Able to exercise without chest pain and/or shortness of breath   Unable to go up a flight of stairs without shortness of breath    Anesthesia review: Hx: Afib,OSA(NO CPAP),HTN,PSVT,DIA.  Patient denies shortness of breath, fever, cough and chest pain at PAT appointment   Patient verbalized understanding of instructions that were given to them at the PAT appointment. Patient was also instructed that they will need to review over the PAT instructions again at home before surgery.

## 2022-10-26 NOTE — Anesthesia Preprocedure Evaluation (Addendum)
Anesthesia Evaluation  Patient identified by MRN, date of birth, ID band Patient awake    Reviewed: Allergy & Precautions, NPO status , Patient's Chart, lab work & pertinent test results  Airway Mallampati: II  TM Distance: >3 FB     Dental no notable dental hx.    Pulmonary asthma , sleep apnea , former smoker   Pulmonary exam normal        Cardiovascular hypertension, Pt. on medications + dysrhythmias Atrial Fibrillation  Rhythm:Irregular Rate:Normal   CV: Echo 04/19/2020 1. Possible fusion of noncoronary and left coronary cusps but no  significant AS by doppler.   2. Left ventricular ejection fraction, by estimation, is 60 to 65%. The  left ventricle has normal function. The left ventricle has no regional  wall motion abnormalities. There is mild left ventricular hypertrophy.  Left ventricular diastolic function  could not be evaluated.   3. Right ventricular systolic function is normal. The right ventricular  size is normal. Tricuspid regurgitation signal is inadequate for assessing  PA pressure.   4. The mitral valve is normal in structure. Trivial mitral valve  regurgitation. No evidence of mitral stenosis.   5. The aortic valve is abnormal. Aortic valve regurgitation is trivial.  Mild to moderate aortic valve sclerosis/calcification is present, without  any evidence of aortic stenosis.   6. The inferior vena cava is normal in size with greater than 50%  respiratory variability, suggesting right atrial pressure of 3 mmHg.     Neuro/Psych   Anxiety     negative neurological ROS     GI/Hepatic Neg liver ROS, PUD,GERD  ,,  Endo/Other  diabetes    Renal/GU   negative genitourinary   Musculoskeletal  (+) Arthritis , Osteoarthritis,  Fibromyalgia -  Abdominal Normal abdominal exam  (+)   Peds  Hematology Lab Results      Component                Value               Date                      WBC                       7.9                 10/21/2022                HGB                      13.3                10/21/2022                HCT                      40.7                10/21/2022                MCV                      90.4                10/21/2022                PLT  238                 10/21/2022              Anesthesia Other Findings   Reproductive/Obstetrics                             Anesthesia Physical Anesthesia Plan  ASA: 3  Anesthesia Plan: MAC, Regional and Spinal   Post-op Pain Management: Regional block*   Induction: Intravenous  PONV Risk Score and Plan: 2 and Ondansetron, Dexamethasone, Propofol infusion and Treatment may vary due to age or medical condition  Airway Management Planned: Simple Face Mask and Nasal Cannula  Additional Equipment: None  Intra-op Plan:   Post-operative Plan:   Informed Consent: I have reviewed the patients History and Physical, chart, labs and discussed the procedure including the risks, benefits and alternatives for the proposed anesthesia with the patient or authorized representative who has indicated his/her understanding and acceptance.     Dental advisory given  Plan Discussed with: CRNA  Anesthesia Plan Comments: (See PAT note 10/21/2022)       Anesthesia Quick Evaluation

## 2022-10-26 NOTE — Progress Notes (Signed)
Anesthesia Chart Review   Case: [id number] Date/Time: 11/02/22 1405   Procedure: TOTAL KNEE ARTHROPLASTY (Right: Knee)   Anesthesia type: Spinal   Pre-op diagnosis: Right knee osteoarthritis   Location: [facility] ROOM 10 / [facility]   Surgeons: Durene Romans, MD       DISCUSSION:82 y.o. former smoker with history of asthma, HTN, sleep apnea, DM II, paroxysmal atrial fibrillation, right knee OA scheduled for above procedure 11/02/2022 with Dr. Durene Romans.  Per cardiology preoperative evaluation 09/30/2022, "[name]'s perioperative risk of a major cardiac event is 0.4% according to the Revised Cardiac Risk Index (RCRI).  Therefore, she is at low risk for perioperative complications.   Her functional capacity is good at 5.07 METs according to the Duke Activity Status Index (DASI). Recommendations: According to ACC/AHA guidelines, no further cardiovascular testing needed.  The patient may proceed to surgery at acceptable risk." VS: BP (!) 163/71   Pulse 64   Temp 36.7 C (Oral)   Ht 5\' 3"  (1.6 m)   Wt 88.9 kg   LMP 12/30/2000   SpO2 97%   BMI 34.72 kg/m   PROVIDERS: Myrlene Broker, MD is PCP  Cardiologist - Dr. Dietrich Pates  LABS: Labs reviewed: Acceptable for surgery. (all labs ordered are listed, but only abnormal results are displayed)  Labs Reviewed  HEMOGLOBIN A1C - Abnormal; Notable for the following components:      Result Value   Hgb A1c MFr Bld 6.3 (*)    All other components within normal limits  BASIC METABOLIC PANEL - Abnormal; Notable for the following components:   Glucose, Bld 178 (*)    BUN 35 (*)    Creatinine, Ser 1.31 (*)    GFR, Estimated 41 (*)    All other components within normal limits  GLUCOSE, CAPILLARY - Abnormal; Notable for the following components:   Glucose-Capillary 125 (*)    All other components within normal limits  SURGICAL PCR SCREEN  CBC     IMAGES:   EKG:   CV: Echo 04/19/2020 1. Possible fusion of noncoronary and left  coronary cusps but no  significant AS by doppler.   2. Left ventricular ejection fraction, by estimation, is 60 to 65%. The  left ventricle has normal function. The left ventricle has no regional  wall motion abnormalities. There is mild left ventricular hypertrophy.  Left ventricular diastolic function  could not be evaluated.   3. Right ventricular systolic function is normal. The right ventricular  size is normal. Tricuspid regurgitation signal is inadequate for assessing  PA pressure.   4. The mitral valve is normal in structure. Trivial mitral valve  regurgitation. No evidence of mitral stenosis.   5. The aortic valve is abnormal. Aortic valve regurgitation is trivial.  Mild to moderate aortic valve sclerosis/calcification is present, without  any evidence of aortic stenosis.   6. The inferior vena cava is normal in size with greater than 50%  respiratory variability, suggesting right atrial pressure of 3 mmHg.  Past Medical History:  Diagnosis Date   Allergic rhinitis    Allergy    Anxiety    and panic attacks;pt states that she is claustrophobic   Asthma    Atrial fibrillation (HCC)    Used to take Coumadin-instructed to stop   Cataract    bilateral surgery to remove   Chronic back pain    hx buldging disc   Colon cancer (HCC) 2007   s/p surgery and chemo   Constipation  takes Colace prn   Diabetes mellitus    glimepiride   DJD (degenerative joint disease)    Dyspnea    Dysrhythmia    Fibromyalgia    but doesn't take any meds   Gastric ulcer    GERD (gastroesophageal reflux disease)    uses Tums prn   Gout    takes Colchicine prn   Heart murmur    History of blood clots    right lung in early 90's   History of colon polyps    History of migraine headaches    last migraine 17yrs ago;Pt states she does get sinus headaches   History of PSVT (paroxysmal supraventricular tachycardia) no current problems   History of shingles    HLD (hyperlipidemia)    diet  control - no meds   Hypertension    takes Micardis and Diltiazem daily   Osteoarthritis    Peptic ulcer disease    Peripheral edema    takes Furosemide every other day   Sleep apnea    doesn't use CPAP    Past Surgical History:  Procedure Laterality Date   ANKLE SURGERY     tumor-benign removed left ankle   ANTERIOR CERVICAL DECOMP/DISCECTOMY FUSION  01/12/2011   Procedure: ANTERIOR CERVICAL DECOMPRESSION/DISCECTOMY FUSION 2 LEVELS;  Surgeon: Kathaleen Maser Pool;  Location: [facility];  Service: Neurosurgery;  Laterality: Bilateral;  Cervical five-six, six-seven anterior cervical discectomy and fusion with allograft and plating   ANTERIOR CERVICAL DECOMP/DISCECTOMY FUSION  06/11/2011   Procedure: ANTERIOR CERVICAL DECOMPRESSION/DISCECTOMY FUSION 1 LEVEL/HARDWARE REMOVAL;  Surgeon: Temple Pacini, MD;  Location: [facility];  Service: Neurosurgery;  Laterality: N/A;  Cervical four-five anterior cervical decompression fusion with allograft and plating; Removal of Cervical five to seven plate   bilateral cataracts removed     caesarean section  1966/69/72   x 3   CARDIAC CATHETERIZATION  90's and 2005   CARPAL TUNNEL RELEASE Left 11/2013   cataract surgery  2010   COLECTOMY  2007   colon cancer   COLONOSCOPY     DILATION AND CURETTAGE OF UTERUS  02-19-11   & POLYP REMOVAL   GANGLION CYST EXCISION  > 61yrs ago   removed from left pointer finger   HEEL SPUR SURGERY Right 1992   x 2   HYSTEROSCOPY WITH D & C  02/19/2011   Procedure: DILATATION AND CURETTAGE /HYSTEROSCOPY;  Surgeon: Dara Lords, MD;  Location: [facility];  Service: Gynecology;  Laterality: N/A;  requests one hour   JOINT REPLACEMENT Left    knee   knee arthrosocpy     bil;couple of years before knee replacement   KNEE SURGERY     left TKA   LEFT HEART CATH AND CORONARY ANGIOGRAPHY N/A 04/21/2020   Procedure: LEFT HEART CATH AND CORONARY ANGIOGRAPHY;  Surgeon: Runell Gess, MD;  Location: [facility];   Service: Cardiovascular;  Laterality: N/A;   LUMBAR DISC SURGERY  2008   port a cath placed  2008   port a cath removed  2008   pyelonidal cystectomy     at age 19   SHOULDER ARTHROSCOPY WITH SUBACROMIAL DECOMPRESSION AND OPEN ROTATOR C Right 03/16/2013   Procedure: RIGHT SHOULDER ARTHROSCOPY WITH SUBACROMIAL DECOMPRESSION AND OPEN ROTATOR CUFF REPAIR, OPEN DISTAL CLAVICLE  RESECTION POSSIBLE BICEP TENODESIS ;  Surgeon: Verlee Rossetti, MD;  Location: [facility];  Service: Orthopedics;  Laterality: Right;   TRIGGER FINGER RELEASE Left    thumb, tendonitis, tumor  removed   TUBAL LIGATION  1972    MEDICATIONS:  albuterol (VENTOLIN HFA) 108 (90 Base) MCG/ACT inhaler   amoxicillin (AMOXIL) 500 MG capsule   Blood Glucose Monitoring Suppl (TRUE METRIX METER) w/Device KIT   Colchicine (MITIGARE) 0.6 MG CAPS   diltiazem (CARDIZEM CD) 120 MG 24 hr capsule   diltiazem (CARDIZEM CD) 240 MG 24 hr capsule   docusate sodium (COLACE) 100 MG capsule   glucose blood (TRUE METRIX BLOOD GLUCOSE TEST) test strip   hydrocortisone cream 1 %   Lancets (ACCU-CHEK SOFT TOUCH) lancets   losartan (COZAAR) 100 MG tablet   ondansetron (ZOFRAN) 4 MG tablet   pravastatin (PRAVACHOL) 20 MG tablet   sodium chloride (OCEAN) 0.65 % SOLN nasal spray   traMADol (ULTRAM) 50 MG tablet   triamcinolone ointment (KENALOG) 0.5 %   No current facility-administered medications for this encounter.    Jodell Cipro Ward, PA-C [facility] [phone]

## 2022-11-01 NOTE — Progress Notes (Signed)
Patient aware to arrive at 1030 for 1255 surgery 11/02/22

## 2022-11-02 ENCOUNTER — Other Ambulatory Visit: Payer: Self-pay

## 2022-11-02 ENCOUNTER — Encounter (HOSPITAL_COMMUNITY): Payer: Self-pay | Admitting: Orthopedic Surgery

## 2022-11-02 ENCOUNTER — Ambulatory Visit (HOSPITAL_COMMUNITY): Payer: Medicare HMO | Admitting: Registered Nurse

## 2022-11-02 ENCOUNTER — Observation Stay (HOSPITAL_COMMUNITY)
Admission: RE | Admit: 2022-11-02 | Discharge: 2022-11-03 | Disposition: A | Discharge status: Home / Self Care | Payer: Medicare HMO | Source: Ambulatory Visit | Attending: Orthopedic Surgery | Admitting: Orthopedic Surgery

## 2022-11-02 ENCOUNTER — Ambulatory Visit (HOSPITAL_COMMUNITY): Payer: Medicare HMO | Admitting: Physician Assistant

## 2022-11-02 ENCOUNTER — Encounter (HOSPITAL_COMMUNITY)
Admission: RE | Disposition: A | Discharge status: Home / Self Care | Payer: Self-pay | Source: Ambulatory Visit | Attending: Orthopedic Surgery

## 2022-11-02 DIAGNOSIS — I129 Hypertensive chronic kidney disease with stage 1 through stage 4 chronic kidney disease, or unspecified chronic kidney disease: Secondary | ICD-10-CM | POA: Insufficient documentation

## 2022-11-02 DIAGNOSIS — Z87891 Personal history of nicotine dependence: Secondary | ICD-10-CM | POA: Insufficient documentation

## 2022-11-02 DIAGNOSIS — M1711 Unilateral primary osteoarthritis, right knee: Secondary | ICD-10-CM

## 2022-11-02 DIAGNOSIS — G4730 Sleep apnea, unspecified: Secondary | ICD-10-CM | POA: Diagnosis not present

## 2022-11-02 DIAGNOSIS — I10 Essential (primary) hypertension: Secondary | ICD-10-CM | POA: Diagnosis not present

## 2022-11-02 DIAGNOSIS — N1832 Chronic kidney disease, stage 3b: Secondary | ICD-10-CM | POA: Insufficient documentation

## 2022-11-02 DIAGNOSIS — I4891 Unspecified atrial fibrillation: Secondary | ICD-10-CM | POA: Diagnosis not present

## 2022-11-02 DIAGNOSIS — J449 Chronic obstructive pulmonary disease, unspecified: Secondary | ICD-10-CM | POA: Diagnosis not present

## 2022-11-02 DIAGNOSIS — Z79899 Other long term (current) drug therapy: Secondary | ICD-10-CM | POA: Diagnosis not present

## 2022-11-02 DIAGNOSIS — Z85038 Personal history of other malignant neoplasm of large intestine: Secondary | ICD-10-CM | POA: Diagnosis not present

## 2022-11-02 DIAGNOSIS — J45909 Unspecified asthma, uncomplicated: Secondary | ICD-10-CM | POA: Diagnosis not present

## 2022-11-02 DIAGNOSIS — Z96651 Presence of right artificial knee joint: Principal | ICD-10-CM

## 2022-11-02 DIAGNOSIS — G8918 Other acute postprocedural pain: Secondary | ICD-10-CM | POA: Diagnosis not present

## 2022-11-02 DIAGNOSIS — Z96652 Presence of left artificial knee joint: Secondary | ICD-10-CM | POA: Insufficient documentation

## 2022-11-02 HISTORY — PX: TOTAL KNEE ARTHROPLASTY: SHX125

## 2022-11-02 LAB — GLUCOSE, CAPILLARY
Glucose-Capillary: 127 mg/dL — ABNORMAL HIGH (ref 70–99)
Glucose-Capillary: 146 mg/dL — ABNORMAL HIGH (ref 70–99)
Glucose-Capillary: 211 mg/dL — ABNORMAL HIGH (ref 70–99)
Glucose-Capillary: 201 mg/dL — ABNORMAL HIGH (ref 70–99)

## 2022-11-02 SURGERY — ARTHROPLASTY, KNEE, TOTAL
Anesthesia: Monitor Anesthesia Care | Site: Knee | Laterality: Right

## 2022-11-02 MED ORDER — EPHEDRINE 5 MG/ML INJ
INTRAVENOUS | Status: AC
  Filled 2022-11-02: qty 5

## 2022-11-02 MED ORDER — HYDROMORPHONE HCL 1 MG/ML IJ SOLN
0.5000 mg | INTRAMUSCULAR | Status: DC | PRN
  Administered 2022-11-02: 0.5 mg via INTRAVENOUS

## 2022-11-02 MED ORDER — HYDROMORPHONE HCL 1 MG/ML IJ SOLN
0.5000 mg | Freq: Once | INTRAMUSCULAR | Status: AC
  Administered 2022-11-02: 0.5 mg via INTRAVENOUS

## 2022-11-02 MED ORDER — RIVAROXABAN 10 MG PO TABS
10.0000 mg | ORAL_TABLET | Freq: Every day | ORAL | Status: DC
  Administered 2022-11-03: 10 mg via ORAL
  Filled 2022-11-02: qty 1

## 2022-11-02 MED ORDER — METHOCARBAMOL 500 MG PO TABS
500.0000 mg | ORAL_TABLET | Freq: Four times a day (QID) | ORAL | Status: DC | PRN
  Administered 2022-11-02: 500 mg via ORAL
  Filled 2022-11-02: qty 1

## 2022-11-02 MED ORDER — KETOROLAC TROMETHAMINE 30 MG/ML IJ SOLN
INTRAMUSCULAR | Status: DC | PRN
  Administered 2022-11-02: 30 mg

## 2022-11-02 MED ORDER — BUPIVACAINE-EPINEPHRINE (PF) 0.25% -1:200000 IJ SOLN
INTRAMUSCULAR | Status: DC | PRN
  Administered 2022-11-02: 30 mL

## 2022-11-02 MED ORDER — HYDROMORPHONE HCL 1 MG/ML IJ SOLN
INTRAMUSCULAR | Status: AC
  Filled 2022-11-02: qty 1

## 2022-11-02 MED ORDER — SODIUM CHLORIDE (PF) 0.9 % IJ SOLN
INTRAMUSCULAR | Status: DC | PRN
  Administered 2022-11-02: 30 mL

## 2022-11-02 MED ORDER — 0.9 % SODIUM CHLORIDE (POUR BTL) OPTIME
TOPICAL | Status: DC | PRN
  Administered 2022-11-02: 1000 mL

## 2022-11-02 MED ORDER — BUPIVACAINE IN DEXTROSE 0.75-8.25 % IT SOLN
INTRATHECAL | Status: DC | PRN
Start: 2022-11-02 — End: 2022-11-02
  Administered 2022-11-02: 1.6 mL via INTRATHECAL

## 2022-11-02 MED ORDER — ACETAMINOPHEN 10 MG/ML IV SOLN
1000.0000 mg | Freq: Once | INTRAVENOUS | Status: DC | PRN
  Administered 2022-11-02: 1000 mg via INTRAVENOUS

## 2022-11-02 MED ORDER — FENTANYL CITRATE PF 50 MCG/ML IJ SOSY
PREFILLED_SYRINGE | INTRAMUSCULAR | Status: AC
  Filled 2022-11-02: qty 2

## 2022-11-02 MED ORDER — SODIUM CHLORIDE 0.9 % IV SOLN: INTRAVENOUS | Status: DC

## 2022-11-02 MED ORDER — METOCLOPRAMIDE HCL 5 MG PO TABS: 5.0000 mg | ORAL_TABLET | Freq: Three times a day (TID) | ORAL | Status: DC | PRN

## 2022-11-02 MED ORDER — DEXAMETHASONE SODIUM PHOSPHATE 10 MG/ML IJ SOLN
INTRAMUSCULAR | Status: DC | PRN
  Administered 2022-11-02: 10 mg

## 2022-11-02 MED ORDER — DEXAMETHASONE SODIUM PHOSPHATE 4 MG/ML IJ SOLN
INTRAMUSCULAR | Status: DC | PRN
Start: 2022-11-02 — End: 2022-11-02
  Administered 2022-11-02: 10 mg via INTRAVENOUS

## 2022-11-02 MED ORDER — ORAL CARE MOUTH RINSE: 15.0000 mL | Freq: Once | OROMUCOSAL | Status: AC

## 2022-11-02 MED ORDER — LACTATED RINGERS IV SOLN: INTRAVENOUS | Status: DC

## 2022-11-02 MED ORDER — SODIUM CHLORIDE (PF) 0.9 % IJ SOLN
INTRAMUSCULAR | Status: AC
  Filled 2022-11-02: qty 50

## 2022-11-02 MED ORDER — ONDANSETRON HCL 4 MG PO TABS
4.0000 mg | ORAL_TABLET | Freq: Four times a day (QID) | ORAL | Status: DC | PRN
  Administered 2022-11-03: 4 mg via ORAL

## 2022-11-02 MED ORDER — DILTIAZEM HCL ER COATED BEADS 240 MG PO CP24: 240.0000 mg | ORAL_CAPSULE | Freq: Every day | ORAL | Status: DC

## 2022-11-02 MED ORDER — BUPIVACAINE-EPINEPHRINE (PF) 0.5% -1:200000 IJ SOLN
INTRAMUSCULAR | Status: AC
  Filled 2022-11-02: qty 30

## 2022-11-02 MED ORDER — MIDAZOLAM HCL 2 MG/2ML IJ SOLN
2.0000 mg | INTRAMUSCULAR | Status: DC
  Filled 2022-11-02: qty 2

## 2022-11-02 MED ORDER — POLYETHYLENE GLYCOL 3350 17 G PO PACK
17.0000 g | PACK | Freq: Two times a day (BID) | ORAL | Status: DC
  Administered 2022-11-02 – 2022-11-03 (×2): 17 g via ORAL
  Filled 2022-11-02: qty 1

## 2022-11-02 MED ORDER — SENNA 8.6 MG PO TABS
2.0000 | ORAL_TABLET | Freq: Every day | ORAL | Status: DC
  Administered 2022-11-02: 17.2 mg via ORAL
  Filled 2022-11-02: qty 2

## 2022-11-02 MED ORDER — ALBUTEROL SULFATE (2.5 MG/3ML) 0.083% IN NEBU: 2.5000 mg | INHALATION_SOLUTION | Freq: Four times a day (QID) | RESPIRATORY_TRACT | Status: DC | PRN

## 2022-11-02 MED ORDER — TRANEXAMIC ACID-NACL 1000-0.7 MG/100ML-% IV SOLN
INTRAVENOUS | Status: AC
  Filled 2022-11-02: qty 100

## 2022-11-02 MED ORDER — PROPOFOL 1000 MG/100ML IV EMUL
INTRAVENOUS | Status: AC
  Filled 2022-11-02: qty 100

## 2022-11-02 MED ORDER — BISACODYL 10 MG RE SUPP: 10.0000 mg | Freq: Every day | RECTAL | Status: DC | PRN

## 2022-11-02 MED ORDER — FENTANYL CITRATE PF 50 MCG/ML IJ SOSY
100.0000 ug | PREFILLED_SYRINGE | INTRAMUSCULAR | Status: DC
  Administered 2022-11-02 (×3): 50 ug via INTRAVENOUS
  Filled 2022-11-02: qty 2

## 2022-11-02 MED ORDER — INSULIN ASPART 100 UNIT/ML IJ SOLN
0.0000 [IU] | Freq: Three times a day (TID) | INTRAMUSCULAR | Status: DC
  Administered 2022-11-02: 5 [IU] via SUBCUTANEOUS
  Administered 2022-11-03: 3 [IU] via SUBCUTANEOUS

## 2022-11-02 MED ORDER — FENTANYL CITRATE PF 50 MCG/ML IJ SOSY
PREFILLED_SYRINGE | INTRAMUSCULAR | Status: AC
  Filled 2022-11-02: qty 1

## 2022-11-02 MED ORDER — POVIDONE-IODINE 10 % EX SWAB: 2.0000 | Freq: Once | CUTANEOUS | Status: DC

## 2022-11-02 MED ORDER — PHENOL 1.4 % MT LIQD: 1.0000 | OROMUCOSAL | Status: DC | PRN

## 2022-11-02 MED ORDER — ONDANSETRON HCL 4 MG/2ML IJ SOLN: 4.0000 mg | Freq: Four times a day (QID) | INTRAMUSCULAR | Status: DC | PRN

## 2022-11-02 MED ORDER — EPHEDRINE SULFATE (PRESSORS) 50 MG/ML IJ SOLN
INTRAMUSCULAR | Status: DC | PRN
Start: 2022-11-02 — End: 2022-11-02
  Administered 2022-11-02: 10 mg via INTRAVENOUS

## 2022-11-02 MED ORDER — DILTIAZEM HCL ER COATED BEADS 120 MG PO CP24: 120.0000 mg | ORAL_CAPSULE | Freq: Every day | ORAL | Status: DC

## 2022-11-02 MED ORDER — METHOCARBAMOL 500 MG IVPB - SIMPLE MED
INTRAVENOUS | Status: AC
  Filled 2022-11-02: qty 55

## 2022-11-02 MED ORDER — PRAVASTATIN SODIUM 20 MG PO TABS
20.0000 mg | ORAL_TABLET | Freq: Every day | ORAL | Status: DC
  Administered 2022-11-03: 20 mg via ORAL
  Filled 2022-11-02: qty 1

## 2022-11-02 MED ORDER — CEFAZOLIN SODIUM-DEXTROSE 2-4 GM/100ML-% IV SOLN
2.0000 g | INTRAVENOUS | Status: AC
  Administered 2022-11-02: 2 g via INTRAVENOUS
  Filled 2022-11-02: qty 100

## 2022-11-02 MED ORDER — DIPHENHYDRAMINE HCL 12.5 MG/5ML PO ELIX: 12.5000 mg | ORAL_SOLUTION | ORAL | Status: DC | PRN

## 2022-11-02 MED ORDER — OXYCODONE HCL 5 MG PO TABS: 5.0000 mg | ORAL_TABLET | ORAL | Status: DC | PRN

## 2022-11-02 MED ORDER — OXYCODONE HCL 5 MG PO TABS
10.0000 mg | ORAL_TABLET | ORAL | Status: DC | PRN
  Administered 2022-11-02: 15 mg via ORAL
  Administered 2022-11-02: 10 mg via ORAL
  Administered 2022-11-02 – 2022-11-03 (×3): 15 mg via ORAL
  Filled 2022-11-02 (×4): qty 3

## 2022-11-02 MED ORDER — ACETAMINOPHEN 500 MG PO TABS
1000.0000 mg | ORAL_TABLET | Freq: Four times a day (QID) | ORAL | Status: DC
  Administered 2022-11-02 – 2022-11-03 (×2): 1000 mg via ORAL
  Filled 2022-11-02 (×3): qty 2

## 2022-11-02 MED ORDER — PROPOFOL 500 MG/50ML IV EMUL
INTRAVENOUS | Status: DC | PRN
  Administered 2022-11-02: 20 mg via INTRAVENOUS
  Administered 2022-11-02: 40 mg via INTRAVENOUS
  Administered 2022-11-02: 30 mg via INTRAVENOUS
  Administered 2022-11-02 (×2): 50 mg via INTRAVENOUS
  Administered 2022-11-02: 30 mg via INTRAVENOUS
  Administered 2022-11-02: 50 ug/kg/min via INTRAVENOUS
  Administered 2022-11-02: 30 mg via INTRAVENOUS
  Administered 2022-11-02: 20 mg via INTRAVENOUS

## 2022-11-02 MED ORDER — SODIUM CHLORIDE 0.9 % IR SOLN
Status: DC | PRN
  Administered 2022-11-02: 1000 mL

## 2022-11-02 MED ORDER — MENTHOL 3 MG MT LOZG: 1.0000 | LOZENGE | OROMUCOSAL | Status: DC | PRN

## 2022-11-02 MED ORDER — LOSARTAN POTASSIUM 50 MG PO TABS
100.0000 mg | ORAL_TABLET | Freq: Every day | ORAL | Status: DC
  Administered 2022-11-03: 100 mg via ORAL
  Filled 2022-11-02: qty 2

## 2022-11-02 MED ORDER — FENTANYL CITRATE (PF) 100 MCG/2ML IJ SOLN
INTRAMUSCULAR | Status: AC
  Filled 2022-11-02: qty 2

## 2022-11-02 MED ORDER — CEFAZOLIN SODIUM-DEXTROSE 2-4 GM/100ML-% IV SOLN
2.0000 g | Freq: Four times a day (QID) | INTRAVENOUS | Status: AC
  Administered 2022-11-02 (×2): 2 g via INTRAVENOUS
  Filled 2022-11-02 (×2): qty 100

## 2022-11-02 MED ORDER — LOSARTAN POTASSIUM 50 MG PO TABS: 100.0000 mg | ORAL_TABLET | Freq: Every day | ORAL | Status: DC

## 2022-11-02 MED ORDER — DEXAMETHASONE SODIUM PHOSPHATE 10 MG/ML IJ SOLN: 8.0000 mg | Freq: Once | INTRAMUSCULAR | Status: DC

## 2022-11-02 MED ORDER — DILTIAZEM HCL ER COATED BEADS 180 MG PO CP24
360.0000 mg | ORAL_CAPSULE | Freq: Every day | ORAL | Status: DC
  Administered 2022-11-03: 360 mg via ORAL
  Filled 2022-11-02: qty 2

## 2022-11-02 MED ORDER — FENTANYL CITRATE PF 50 MCG/ML IJ SOSY
25.0000 ug | PREFILLED_SYRINGE | INTRAMUSCULAR | Status: DC | PRN
  Administered 2022-11-02 (×3): 50 ug via INTRAVENOUS

## 2022-11-02 MED ORDER — TRANEXAMIC ACID-NACL 1000-0.7 MG/100ML-% IV SOLN
1000.0000 mg | Freq: Once | INTRAVENOUS | Status: AC
  Administered 2022-11-02: 1000 mg via INTRAVENOUS

## 2022-11-02 MED ORDER — TRANEXAMIC ACID-NACL 1000-0.7 MG/100ML-% IV SOLN
1000.0000 mg | INTRAVENOUS | Status: AC
  Administered 2022-11-02: 1000 mg via INTRAVENOUS
  Filled 2022-11-02: qty 100

## 2022-11-02 MED ORDER — OXYCODONE HCL 5 MG PO TABS
ORAL_TABLET | ORAL | Status: AC
  Filled 2022-11-02: qty 2

## 2022-11-02 MED ORDER — ACETAMINOPHEN 10 MG/ML IV SOLN
INTRAVENOUS | Status: AC
  Filled 2022-11-02: qty 100

## 2022-11-02 MED ORDER — ALUM & MAG HYDROXIDE-SIMETH 200-200-20 MG/5ML PO SUSP: 30.0000 mL | ORAL | Status: DC | PRN

## 2022-11-02 MED ORDER — STERILE WATER FOR IRRIGATION IR SOLN
Status: DC | PRN
  Administered 2022-11-02: 2000 mL

## 2022-11-02 MED ORDER — ROPIVACAINE HCL 7.5 MG/ML IJ SOLN
INTRAMUSCULAR | Status: DC | PRN
  Administered 2022-11-02: 20 mL via PERINEURAL

## 2022-11-02 MED ORDER — CHLORHEXIDINE GLUCONATE 0.12 % MT SOLN
15.0000 mL | Freq: Once | OROMUCOSAL | Status: AC
  Administered 2022-11-02: 15 mL via OROMUCOSAL

## 2022-11-02 MED ORDER — KETOROLAC TROMETHAMINE 30 MG/ML IJ SOLN
INTRAMUSCULAR | Status: AC
  Filled 2022-11-02: qty 1

## 2022-11-02 MED ORDER — DEXAMETHASONE SODIUM PHOSPHATE 10 MG/ML IJ SOLN
10.0000 mg | Freq: Once | INTRAMUSCULAR | Status: AC
  Administered 2022-11-03: 10 mg via INTRAVENOUS
  Filled 2022-11-02: qty 1

## 2022-11-02 MED ORDER — LACTATED RINGERS IV SOLN: INTRAVENOUS | Status: DC | PRN

## 2022-11-02 MED ORDER — ALBUTEROL SULFATE HFA 108 (90 BASE) MCG/ACT IN AERS: 2.0000 | INHALATION_SPRAY | Freq: Four times a day (QID) | RESPIRATORY_TRACT | Status: DC | PRN

## 2022-11-02 MED ORDER — METHOCARBAMOL 500 MG IVPB - SIMPLE MED
500.0000 mg | Freq: Four times a day (QID) | INTRAVENOUS | Status: DC | PRN
  Administered 2022-11-02: 500 mg via INTRAVENOUS

## 2022-11-02 MED ORDER — METOCLOPRAMIDE HCL 5 MG/ML IJ SOLN: 5.0000 mg | Freq: Three times a day (TID) | INTRAMUSCULAR | Status: DC | PRN

## 2022-11-02 SURGICAL SUPPLY — 57 items
ADH SKN CLS APL DERMABOND .7 (GAUZE/BANDAGES/DRESSINGS) ×1
ATTUNE MED ANAT PAT 32 KNEE (Knees) IMPLANT
ATTUNE PSFEM RTSZ4 NARCEM KNEE (Femur) IMPLANT
ATTUNE PSRP INSR SZ4 8 KNEE (Insert) IMPLANT
BAG COUNTER SPONGE SURGICOUNT (BAG) IMPLANT
BAG SPEC THK2 15X12 ZIP CLS (MISCELLANEOUS)
BAG SPNG CNTER NS LX DISP (BAG)
BAG ZIPLOCK 12X15 (MISCELLANEOUS) IMPLANT
BASEPLATE TIBIAL ROTATING SZ 4 (Knees) IMPLANT
BLADE SAW SGTL 13.0X1.19X90.0M (BLADE) ×1 IMPLANT
BNDG CMPR 6 X 5 YARDS HK CLSR (GAUZE/BANDAGES/DRESSINGS) ×1
BNDG CMPR MED 15X6 ELC VLCR LF (GAUZE/BANDAGES/DRESSINGS) ×1
BNDG ELASTIC 6INX 5YD STR LF (GAUZE/BANDAGES/DRESSINGS) ×1 IMPLANT
BNDG ELASTIC 6X15 VLCR STRL LF (GAUZE/BANDAGES/DRESSINGS) IMPLANT
BOWL SMART MIX CTS (DISPOSABLE) ×1 IMPLANT
BSPLAT TIB 4 CMNT ROT PLAT STR (Knees) ×1 IMPLANT
CEMENT HV SMART SET (Cement) IMPLANT
COVER SURGICAL LIGHT HANDLE (MISCELLANEOUS) ×1 IMPLANT
CUFF TOURN SGL QUICK 34 (TOURNIQUET CUFF) ×1
CUFF TRNQT CYL 34X4.125X (TOURNIQUET CUFF) ×1 IMPLANT
DERMABOND ADVANCED .7 DNX12 (GAUZE/BANDAGES/DRESSINGS) ×1 IMPLANT
DRAPE U-SHAPE 47X51 STRL (DRAPES) ×1 IMPLANT
DRESSING AQUACEL AG SP 3.5X10 (GAUZE/BANDAGES/DRESSINGS) ×1 IMPLANT
DRSG AQUACEL AG ADV 3.5X10 (GAUZE/BANDAGES/DRESSINGS) IMPLANT
DRSG AQUACEL AG SP 3.5X10 (GAUZE/BANDAGES/DRESSINGS) ×1
DURAPREP 26ML APPLICATOR (WOUND CARE) ×2 IMPLANT
ELECT REM PT RETURN 15FT ADLT (MISCELLANEOUS) ×1 IMPLANT
GLOVE BIO SURGEON STRL SZ 6 (GLOVE) ×1 IMPLANT
GLOVE BIOGEL PI IND STRL 6.5 (GLOVE) ×1 IMPLANT
GLOVE BIOGEL PI IND STRL 7.5 (GLOVE) ×1 IMPLANT
GLOVE ORTHO TXT STRL SZ7.5 (GLOVE) ×2 IMPLANT
GOWN STRL REUS W/ TWL LRG LVL3 (GOWN DISPOSABLE) ×2 IMPLANT
GOWN STRL REUS W/TWL LRG LVL3 (GOWN DISPOSABLE) ×2
HANDPIECE INTERPULSE COAX TIP (DISPOSABLE) ×1
HOLDER FOLEY CATH W/STRAP (MISCELLANEOUS) IMPLANT
KIT TURNOVER KIT A (KITS) IMPLANT
MANIFOLD NEPTUNE II (INSTRUMENTS) ×1 IMPLANT
NDL SAFETY ECLIP 18X1.5 (MISCELLANEOUS) IMPLANT
NS IRRIG 1000ML POUR BTL (IV SOLUTION) ×1 IMPLANT
PACK ICE MAXI GEL EZY WRAP (MISCELLANEOUS) IMPLANT
PACK TOTAL KNEE CUSTOM (KITS) ×1 IMPLANT
PIN STEINMAN FIXATION KNEE (PIN) IMPLANT
PROTECTOR NERVE ULNAR (MISCELLANEOUS) ×1 IMPLANT
SET HNDPC FAN SPRY TIP SCT (DISPOSABLE) ×1 IMPLANT
SET PAD KNEE POSITIONER (MISCELLANEOUS) ×1 IMPLANT
SPIKE FLUID TRANSFER (MISCELLANEOUS) ×2 IMPLANT
SUT MNCRL AB 4-0 PS2 18 (SUTURE) ×1 IMPLANT
SUT STRATAFIX PDS+ 0 24IN (SUTURE) ×1 IMPLANT
SUT VIC AB 1 CT1 36 (SUTURE) ×1 IMPLANT
SUT VIC AB 2-0 CT1 27 (SUTURE) ×3
SUT VIC AB 2-0 CT1 TAPERPNT 27 (SUTURE) ×2 IMPLANT
SYR 3ML LL SCALE MARK (SYRINGE) ×1 IMPLANT
TOWEL GREEN STERILE FF (TOWEL DISPOSABLE) ×1 IMPLANT
TRAY FOLEY MTR SLVR 16FR STAT (SET/KITS/TRAYS/PACK) ×1 IMPLANT
TUBE SUCTION HIGH CAP CLEAR NV (SUCTIONS) ×1 IMPLANT
WATER STERILE IRR 1000ML POUR (IV SOLUTION) ×2 IMPLANT
WRAP KNEE MAXI GEL POST OP (GAUZE/BANDAGES/DRESSINGS) ×1 IMPLANT

## 2022-11-02 NOTE — Anesthesia Procedure Notes (Signed)
Anesthesia Regional Block: Adductor canal block   Pre-Anesthetic Checklist: , timeout performed,  Correct Patient, Correct Site, Correct Laterality,  Correct Procedure, Correct Position, site marked,  Risks and benefits discussed,  Surgical consent,  Pre-op evaluation,  At surgeon's request and post-op pain management  Laterality: Right  Prep: Dura Prep       Needles:  Injection technique: Single-shot  Needle Type: Echogenic Stimulator Needle     Needle Length: 10cm  Needle Gauge: 20     Additional Needles:   Procedures:,,,, ultrasound used (permanent image in chart),,    Narrative:  Start time: 11/02/2022 11:20 AM End time: 11/02/2022 11:25 AM Injection made incrementally with aspirations every 5 mL.  Performed by: Personally  Anesthesiologist: Atilano Median, DO  Additional Notes: Patient identified. Risks/Benefits/Options discussed with patient including but not limited to bleeding, infection, nerve damage, failed block, incomplete pain control. Patient expressed understanding and wished to proceed. All questions were answered. Sterile technique was used throughout the entire procedure. Please see nursing notes for vital signs. Aspirated in 5cc intervals with injection for negative confirmation. Patient was given instructions on fall risk and not to get out of bed. All questions and concerns addressed with instructions to call with any issues or inadequate analgesia.

## 2022-11-02 NOTE — Anesthesia Procedure Notes (Signed)
Spinal  Patient location during procedure: OR Start time: 11/02/2022 11:55 AM End time: 11/02/2022 12:03 PM Staffing Performed: anesthesiologist  Anesthesiologist: Atilano Median, DO Performed by: Atilano Median, DO Authorized by: Atilano Median, DO   Preanesthetic Checklist Completed: patient identified, IV checked, site marked, risks and benefits discussed, surgical consent, monitors and equipment checked, pre-op evaluation and timeout performed Spinal Block Patient position: sitting Prep: DuraPrep Patient monitoring: heart rate, cardiac monitor, continuous pulse ox and blood pressure Approach: midline Location: L3-4 Injection technique: single-shot Needle Needle type: Pencan  Needle gauge: 24 G Needle length: 10 cm Assessment Events: CSF return Additional Notes - initial attempt unsuccessful, Helmut Muster MD to assist with successful placement  Patient identified. Risks/Benefits/Options discussed with patient including but not limited to bleeding, infection, nerve damage, paralysis, failed block, incomplete pain control, headache, blood pressure changes, nausea, vomiting, reactions to medications, itching and postpartum back pain. Confirmed with bedside nurse the patient's most recent platelet count. Confirmed with patient that they are not currently taking any anticoagulation, have any bleeding history or any family history of bleeding disorders. Patient expressed understanding and wished to proceed. All questions were answered. Sterile technique was used throughout the entire procedure. Please see nursing notes for vital signs. Warning signs of high block given to the patient including shortness of breath, tingling/numbness in hands, complete motor block, or any concerning symptoms with instructions to call for help. Patient was given instructions on fall risk and not to get out of bed. All questions and concerns addressed with instructions to call with any issues or  inadequate analgesia.

## 2022-11-02 NOTE — Discharge Instructions (Addendum)
INSTRUCTIONS AFTER JOINT REPLACEMENT   Remove items at home which could result in a fall. This includes throw rugs or furniture in walking pathways ICE to the affected joint every three hours while awake for 30 minutes at a time, for at least the first 3-5 days, and then as needed for pain and swelling.  Continue to use ice for pain and swelling. You may notice swelling that will progress down to the foot and ankle.  This is normal after surgery.  Elevate your leg when you are not up walking on it.   Continue to use the breathing machine you got in the hospital (incentive spirometer) which will help keep your temperature down.  It is common for your temperature to cycle up and down following surgery, especially at night when you are not up moving around and exerting yourself.  The breathing machine keeps your lungs expanded and your temperature down.   DIET:  As you were doing prior to hospitalization, we recommend a well-balanced diet.  DRESSING / WOUND CARE / SHOWERING  Keep the surgical dressing until follow up.  The dressing is water proof, so you can shower without any extra covering.  IF THE DRESSING FALLS OFF or the wound gets wet inside, change the dressing with sterile gauze.  Please use good hand washing techniques before changing the dressing.  Do not use any lotions or creams on the incision until instructed by your surgeon.    ACTIVITY  Increase activity slowly as tolerated, but follow the weight bearing instructions below.   No driving for 6 weeks or until further direction given by your physician.  You cannot drive while taking narcotics.  No lifting or carrying greater than 10 lbs. until further directed by your surgeon. Avoid periods of inactivity such as sitting longer than an hour when not asleep. This helps prevent blood clots.  You may return to work once you are authorized by your doctor.     WEIGHT BEARING   Weight bearing as tolerated with assist device (walker, cane,  etc) as directed, use it as long as suggested by your surgeon or therapist, typically at least 4-6 weeks.   EXERCISES  Results after joint replacement surgery are often greatly improved when you follow the exercise, range of motion and muscle strengthening exercises prescribed by your doctor. Safety measures are also important to protect the joint from further injury. Any time any of these exercises cause you to have increased pain or swelling, decrease what you are doing until you are comfortable again and then slowly increase them. If you have problems or questions, call your caregiver or physical therapist for advice.   Rehabilitation is important following a joint replacement. After just a few days of immobilization, the muscles of the leg can become weakened and shrink (atrophy).  These exercises are designed to build up the tone and strength of the thigh and leg muscles and to improve motion. Often times heat used for twenty to thirty minutes before working out will loosen up your tissues and help with improving the range of motion but do not use heat for the first two weeks following surgery (sometimes heat can increase post-operative swelling).   These exercises can be done on a training (exercise) mat, on the floor, on a table or on a bed. Use whatever works the best and is most comfortable for you.    Use music or television while you are exercising so that the exercises are a pleasant break in your   day. This will make your life better with the exercises acting as a break in your routine that you can look forward to.   Perform all exercises about fifteen times, three times per day or as directed.  You should exercise both the operative leg and the other leg as well.  Exercises include:   Quad Sets - Tighten up the muscle on the front of the thigh (Quad) and hold for 5-10 seconds.   Straight Leg Raises - With your knee straight (if you were given a brace, keep it on), lift the leg to 60  degrees, hold for 3 seconds, and slowly lower the leg.  Perform this exercise against resistance later as your leg gets stronger.  Leg Slides: Lying on your back, slowly slide your foot toward your buttocks, bending your knee up off the floor (only go as far as is comfortable). Then slowly slide your foot back down until your leg is flat on the floor again.  Angel Wings: Lying on your back spread your legs to the side as far apart as you can without causing discomfort.  Hamstring Strength:  Lying on your back, push your heel against the floor with your leg straight by tightening up the muscles of your buttocks.  Repeat, but this time bend your knee to a comfortable angle, and push your heel against the floor.  You may put a pillow under the heel to make it more comfortable if necessary.   A rehabilitation program following joint replacement surgery can speed recovery and prevent re-injury in the future due to weakened muscles. Contact your doctor or a physical therapist for more information on knee rehabilitation.    CONSTIPATION  Constipation is defined medically as fewer than three stools per week and severe constipation as less than one stool per week.  Even if you have a regular bowel pattern at home, your normal regimen is likely to be disrupted due to multiple reasons following surgery.  Combination of anesthesia, postoperative narcotics, change in appetite and fluid intake all can affect your bowels.   YOU MUST use at least one of the following options; they are listed in order of increasing strength to get the job done.  They are all available over the counter, and you may need to use some, POSSIBLY even all of these options:    Drink plenty of fluids (prune juice may be helpful) and high fiber foods Colace 100 mg by mouth twice a day  Senokot for constipation as directed and as needed Dulcolax (bisacodyl), take with full glass of water  Miralax (polyethylene glycol) once or twice a day as  needed.  If you have tried all these things and are unable to have a bowel movement in the first 3-4 days after surgery call either your surgeon or your primary doctor.    If you experience loose stools or diarrhea, hold the medications until you stool forms back up.  If your symptoms do not get better within 1 week or if they get worse, check with your doctor.  If you experience "the worst abdominal pain ever" or develop nausea or vomiting, please contact the office immediately for further recommendations for treatment.   ITCHING:  If you experience itching with your medications, try taking only a single pain pill, or even half a pain pill at a time.  You can also use Benadryl over the counter for itching or also to help with sleep.   TED HOSE STOCKINGS:  Use stockings on both   legs until for at least 2 weeks or as directed by physician office. They may be removed at night for sleeping.  MEDICATIONS:  See your medication summary on the "After Visit Summary" that nursing will review with you.  You may have some home medications which will be placed on hold until you complete the course of blood thinner medication.  It is important for you to complete the blood thinner medication as prescribed.  PRECAUTIONS:  If you experience chest pain or shortness of breath - call 911 immediately for transfer to the hospital emergency department.   If you develop a fever greater that 101 F, purulent drainage from wound, increased redness or drainage from wound, foul odor from the wound/dressing, or calf pain - CONTACT YOUR SURGEON.                                                   FOLLOW-UP APPOINTMENTS:  If you do not already have a post-op appointment, please call the office for an appointment to be seen by your surgeon.  Guidelines for how soon to be seen are listed in your "After Visit Summary", but are typically between 1-4 weeks after surgery.  OTHER INSTRUCTIONS:   Knee Replacement:  Do not place pillow  under knee, focus on keeping the knee straight while resting. CPM instructions: 0-90 degrees, 2 hours in the morning, 2 hours in the afternoon, and 2 hours in the evening. Place foam block, curve side up under heel at all times except when in CPM or when walking.  DO NOT modify, tear, cut, or change the foam block in any way.  POST-OPERATIVE OPIOID TAPER INSTRUCTIONS: It is important to wean off of your opioid medication as soon as possible. If you do not need pain medication after your surgery it is ok to stop day one. Opioids include: Codeine, Hydrocodone(Norco, Vicodin), Oxycodone(Percocet, oxycontin) and hydromorphone amongst others.  Long term and even short term use of opiods can cause: Increased pain response Dependence Constipation Depression Respiratory depression And more.  Withdrawal symptoms can include Flu like symptoms Nausea, vomiting And more Techniques to manage these symptoms Hydrate well Eat regular healthy meals Stay active Use relaxation techniques(deep breathing, meditating, yoga) Do Not substitute Alcohol to help with tapering If you have been on opioids for less than two weeks and do not have pain than it is ok to stop all together.  Plan to wean off of opioids This plan should start within one week post op of your joint replacement. Maintain the same interval or time between taking each dose and first decrease the dose.  Cut the total daily intake of opioids by one tablet each day Next start to increase the time between doses. The last dose that should be eliminated is the evening dose.   MAKE SURE YOU:  Understand these instructions.  Get help right away if you are not doing well or get worse.    Thank you for letting us be a part of your medical care team.  It is a privilege we respect greatly.  We hope these instructions will help you stay on track for a fast and full recovery!     Information on my medicine - XARELTO (Rivaroxaban)  This  medication education was reviewed with me or my healthcare representative as part of my discharge preparation.      Why was Xarelto prescribed for you? Xarelto was prescribed for you to reduce the risk of blood clots forming after orthopedic surgery. The medical term for these abnormal blood clots is venous thromboembolism (VTE).  What do you need to know about xarelto ? Take your Xarelto ONCE DAILY at the same time every day. You may take it either with or without food.  If you have difficulty swallowing the tablet whole, you may crush it and mix in applesauce just prior to taking your dose.  Take Xarelto exactly as prescribed by your doctor and DO NOT stop taking Xarelto without talking to the doctor who prescribed the medication.  Stopping without other VTE prevention medication to take the place of Xarelto may increase your risk of developing a clot.  After discharge, you should have regular check-up appointments with your healthcare provider that is prescribing your Xarelto.    What do you do if you miss a dose? If you miss a dose, take it as soon as you remember on the same day then continue your regularly scheduled once daily regimen the next day. Do not take two doses of Xarelto on the same day.   Important Safety Information A possible side effect of Xarelto is bleeding. You should call your healthcare provider right away if you experience any of the following: Bleeding from an injury or your nose that does not stop. Unusual colored urine (red or dark brown) or unusual colored stools (red or black). Unusual bruising for unknown reasons. A serious fall or if you hit your head (even if there is no bleeding).  Some medicines may interact with Xarelto and might increase your risk of bleeding while on Xarelto. To help avoid this, consult your healthcare provider or pharmacist prior to using any new prescription or non-prescription medications, including herbals, vitamins,  non-steroidal anti-inflammatory drugs (NSAIDs) and supplements.  This website has more information on Xarelto: www.xarelto.com.   

## 2022-11-02 NOTE — Evaluation (Signed)
Physical Therapy Evaluation Patient Details Name: [name] MRN: [id number] DOB: Dec 21, 1940 Today's Date: 11/02/2022  History of Present Illness  82 yo female presents to therapy s/p R TKA on 11/02/2022 due to failure of conservative measures. Pt PMH includes but is not limited to: A-fib, CKD III, gout, chronic back pain s/p ACDF and lumbar surgery, fibromyalgia, HLD, DM II, OSA, colon ca s/p resection and chemo, HTN, L TKA, and R shoulder arthroscopy.  Clinical Impression      KHADESIA WADZINSKI is a 82 y.o. female POD 0 s/p R TKA. Patient reports IND with mobility at baseline. Patient is now limited by functional impairments (see PT problem list below) and requires CGA for bed mobility and CGA for transfers. Patient was able to ambulate 50 feet with RW and CGA level of assist. Patient instructed in exercise to facilitate ROM and circulation to manage edema. Patient will benefit from continued skilled PT interventions to address impairments and progress towards PLOF. Acute PT will follow to progress mobility and stair training in preparation for safe discharge home with family support and OPPT services.     If plan is discharge home, recommend the following: A little help with walking and/or transfers;A little help with bathing/dressing/bathroom;Assistance with cooking/housework;Assist for transportation;Help with stairs or ramp for entrance   Can travel by private vehicle        Equipment Recommendations Rolling walker (2 wheels) (may benefit from youth height)  Recommendations for Other Services       Functional Status Assessment Patient has had a recent decline in their functional status and demonstrates the ability to make significant improvements in function in a reasonable and predictable amount of time.     Precautions / Restrictions Precautions Precautions: Knee;Fall Restrictions Weight Bearing Restrictions: No      Mobility  Bed Mobility Overal bed mobility: Needs  Assistance Bed Mobility: Supine to Sit     Supine to sit: Contact guard, HOB elevated, Used rails     General bed mobility comments: cues and increased time to scoot to EOB    Transfers Overall transfer level: Needs assistance Equipment used: Rolling walker (2 wheels) Transfers: Sit to/from Stand Sit to Stand: Contact guard assist, From elevated surface           General transfer comment: min ceus pt able to push with one UE    Ambulation/Gait Ambulation/Gait assistance: Contact guard assist Gait Distance (Feet): 50 Feet Assistive device: Rolling walker (2 wheels) Gait Pattern/deviations: Step-to pattern, Antalgic, Trunk flexed Gait velocity: decreased     General Gait Details: min cues for safety with turns, sequencing  Stairs            Wheelchair Mobility     Tilt Bed    Modified Rankin (Stroke Patients Only)       Balance Overall balance assessment: Needs assistance Sitting-balance support: Feet supported Sitting balance-Leahy Scale: Good     Standing balance support: Bilateral upper extremity supported, During functional activity, Reliant on assistive device for balance Standing balance-Leahy Scale: Poor                               Pertinent Vitals/Pain Pain Assessment Pain Assessment: 0-10 Pain Score: 3  Pain Location: R knee and LE Pain Descriptors / Indicators: Aching, Constant, Discomfort, Operative site guarding Pain Intervention(s): Limited activity within patient's tolerance, Monitored during session, Premedicated before session, Ice applied    Home Living Family/patient expects  to be discharged to:: Private residence Living Arrangements: Spouse/significant other Available Help at Discharge: Family Type of Home: House Home Access: Stairs to enter Entrance Stairs-Rails: None Entrance Stairs-Number of Steps: 1   Home Layout: One level Home Equipment: Agricultural consultant (2 wheels);Cane - single point;Shower seat;Grab  bars - tub/shower      Prior Function Prior Level of Function : Independent/Modified Independent;Driving             Mobility Comments: IND no AD for all ADLs, self care tasks and IADLs       Extremity/Trunk Assessment        Lower Extremity Assessment Lower Extremity Assessment: RLE deficits/detail RLE Deficits / Details: ankle DF/PF 4/5; SLR AA for first trial and second attempt pt able to complete SLF < 10 degree lag RLE Sensation: decreased light touch (R foot)    Cervical / Trunk Assessment Cervical / Trunk Assessment: Neck Surgery;Back Surgery  Communication   Communication Communication: No apparent difficulties  Cognition Arousal: Alert Behavior During Therapy: WFL for tasks assessed/performed Overall Cognitive Status: Within Functional Limits for tasks assessed                                          General Comments      Exercises Total Joint Exercises Ankle Circles/Pumps: AROM, Both, 15 reps   Assessment/Plan    PT Assessment Patient needs continued PT services  PT Problem List Decreased strength;Decreased range of motion;Decreased activity tolerance;Decreased mobility;Decreased coordination;Decreased balance;Pain       PT Treatment Interventions DME instruction;Gait training;Stair training;Functional mobility training;Therapeutic activities;Therapeutic exercise;Balance training;Neuromuscular re-education;Patient/family education;Modalities    PT Goals (Current goals can be found in the Care Plan section)  Acute Rehab PT Goals Patient Stated Goal: to be able to get back to yard work and plant new flower beds in the spring PT Goal Formulation: With patient Time For Goal Achievement: 11/16/22 Potential to Achieve Goals: Good    Frequency 7X/week     Co-evaluation               AM-PAC PT "6 Clicks" Mobility  Outcome Measure Help needed turning from your back to your side while in a flat bed without using bedrails?: A  Little Help needed moving from lying on your back to sitting on the side of a flat bed without using bedrails?: A Little Help needed moving to and from a bed to a chair (including a wheelchair)?: A Little Help needed standing up from a chair using your arms (e.g., wheelchair or bedside chair)?: A Little Help needed to walk in hospital room?: A Little Help needed climbing 3-5 steps with a railing? : A Lot 6 Click Score: 17    End of Session Equipment Utilized During Treatment: Gait belt Activity Tolerance: Patient tolerated treatment well;No increased pain Patient left: in chair;with call bell/phone within reach Nurse Communication: Mobility status PT Visit Diagnosis: Unsteadiness on feet (R26.81);Other abnormalities of gait and mobility (R26.89);Difficulty in walking, not elsewhere classified (R26.2);Pain Pain - Right/Left: Right Pain - part of body: Knee;Leg    Time: 9528-4132 PT Time Calculation (min) (ACUTE ONLY): 33 min   Charges:   PT Evaluation $PT Eval Low Complexity: 1 Low PT Treatments $Gait Training: 8-22 mins PT General Charges $$ ACUTE PT VISIT: 1 Visit         Johnny Bridge, PT Acute Rehab   Jacqualyn Posey 11/02/2022, 7:02  PM

## 2022-11-02 NOTE — Anesthesia Postprocedure Evaluation (Signed)
Anesthesia Post Note  Patient: ARISBET DETERDING  Procedure(s) Performed: TOTAL KNEE ARTHROPLASTY (Right: Knee)     Patient location during evaluation: PACU Anesthesia Type: Regional, Spinal and MAC Level of consciousness: awake and alert Pain management: pain level controlled Vital Signs Assessment: post-procedure vital signs reviewed and stable Respiratory status: spontaneous breathing, nonlabored ventilation, respiratory function stable and patient connected to nasal cannula oxygen Cardiovascular status: stable and blood pressure returned to baseline Postop Assessment: no apparent nausea or vomiting Anesthetic complications: no  No notable events documented.  Last Vitals:  Vitals:   11/02/22 1645 11/02/22 1712  BP: (!) 156/81 (!) 155/83  Pulse: 70 70  Resp: 15 16  Temp: 36.6 C   SpO2: 100% 99%    Last Pain:  Vitals:   11/02/22 1712  TempSrc:   PainSc: 3                  Doralene Glanz P Rosalee Tolley

## 2022-11-02 NOTE — Progress Notes (Signed)
Attempted report 

## 2022-11-02 NOTE — Interval H&P Note (Signed)
History and Physical Interval Note:  11/02/2022 10:08 AM  [name]  has presented today for surgery, with the diagnosis of Right knee osteoarthritis.  The various methods of treatment have been discussed with the patient and family. After consideration of risks, benefits and other options for treatment, the patient has consented to  Procedure(s): TOTAL KNEE ARTHROPLASTY (Right) as a surgical intervention.  The patient's history has been reviewed, patient examined, no change in status, stable for surgery.  I have reviewed the patient's chart and labs.  Questions were answered to the patient's satisfaction.     Shelda Pal

## 2022-11-02 NOTE — Op Note (Signed)
NAME:  [name]                      MEDICAL RECORD NO.:  [id number]                             FACILITY:  [facility]      PHYSICIAN:  Madlyn Frankel. Charlann Boxer, M.D.  DATE OF BIRTH:  [birth year]      DATE OF PROCEDURE:  11/02/2022                                     OPERATIVE REPORT         PREOPERATIVE DIAGNOSIS:  Right knee osteoarthritis.      POSTOPERATIVE DIAGNOSIS:  Right knee osteoarthritis.      FINDINGS:  The patient was noted to have complete loss of cartilage and   bone-on-bone arthritis with associated osteophytes in all three compartments of   the knee with a significant synovitis and associated effusion.  The patient had failed months of conservative treatment including medications, injection therapy, activity modification.     PROCEDURE:  Right total knee replacement.      COMPONENTS USED:  DePuy Attune RP PS knee   system, a size 4N femur, 4 tibia, size 8 mm PS AOX insert, and 32 anatomic patellar   button.      SURGEON:  Madlyn Frankel. Charlann Boxer, M.D.      ASSISTANT:  Rosalene Billings, PA-C.      ANESTHESIA:  Regional and Spinal.      SPECIMENS:  None.      COMPLICATION:  None.      DRAINS:  None.  EBL: <200 cc      TOURNIQUET TIME:  36 min at 225 mmHg     The patient was stable to the recovery room.      INDICATION FOR PROCEDURE:  [name] is a 82 y.o. female patient of   mine.  The patient had been seen, evaluated, and treated for months conservatively in the   office with medication, activity modification, and injections.  The patient had   radiographic changes of bone-on-bone arthritis with endplate sclerosis and osteophytes noted.  Based on the radiographic changes and failed conservative measures, the patient   decided to proceed with definitive treatment, total knee replacement.  Risks of infection, DVT, component failure, need for revision surgery, neurovascular injury were reviewed in the office setting.  The postop course was reviewed stressing the efforts  to maximize post-operative satisfaction and function.  Consent was obtained for benefit of pain   relief.      PROCEDURE IN DETAIL:  The patient was brought to the operative theater.   Once adequate anesthesia, preoperative antibiotics, 2 gm of Ancef,1 gm of Tranexamic Acid, and 10 mg of Decadron administered, the patient was positioned supine with a right thigh tourniquet placed.  The  right lower extremity was prepped and draped in sterile fashion.  A time-   out was performed identifying the patient, planned procedure, and the appropriate extremity.      The right lower extremity was placed in the [facility] leg holder.  The leg was   exsanguinated, tourniquet elevated to 225 mmHg.  A midline incision was   made followed by median parapatellar arthrotomy.  Following initial   exposure, attention was first directed to the  patella.  Precut   measurement was noted to be 20 mm.  I resected down to 13 mm and used a   32 anatomic patellar button to restore patellar height as well as cover the cut surface.      The lug holes were drilled and a metal shim was placed to protect the   patella from retractors and saw blade during the procedure.      At this point, attention was now directed to the femur.  The femoral   canal was opened with a drill, irrigated to try to prevent fat emboli.  An   intramedullary rod was passed at 3 degrees valgus, 9 mm of bone was   resected off the distal femur.  Following this resection, the tibia was   subluxated anteriorly.  Using the extramedullary guide, 2 mm of bone was resected off   the proximal medial tibia.  We confirmed the gap would be   stable medially and laterally with a size 5 spacer block as well as confirmed that the tibial cut was perpendicular in the coronal plane, checking with an alignment rod.      Once this was done, I sized the femur to be a size 4 in the anterior-   posterior dimension, chose a narrow component based on medial and   lateral  dimension.  The size 4 rotation block was then pinned in   position anterior referenced using the C-clamp to set rotation.  The   anterior, posterior, and  chamfer cuts were made without difficulty nor   notching making certain that I was along the anterior cortex to help   with flexion gap stability.      The final box cut was made off the lateral aspect of distal femur.      At this point, the tibia was sized to be a size 4.  The size 4 tray was   then pinned in position through the medial third of the tubercle,   drilled, and keel punched.  Trial reduction was now carried with a 4 femur,  4 tibia, a size 8 mm RP PS insert, and the 32 anatomic patella botton.  The knee was brought to full extension with good flexion stability with the patella   tracking through the trochlea without application of pressure.  Given   all these findings the trial components removed.  Final components were   opened and cement was mixed.  The knee was irrigated with normal saline solution and pulse lavage.  The synovial lining was   then injected with 30 cc of 0.25% Marcaine with epinephrine, 1 cc of Toradol and 30 cc of NS for a total of 61 cc.     Final implants were then cemented onto cleaned and dried cut surfaces of bone with the knee brought to extension with a size 8 mm RP PS trial insert.      Once the cement had fully cured, excess cement was removed   throughout the knee.  I confirmed that I was satisfied with the range of   motion and stability, and the final size 8 mm RP PS AOX insert was chosen.  It was   placed into the knee.      The tourniquet had been let down at 36 minutes.  No significant   hemostasis was required.  The extensor mechanism was then reapproximated using #1 Vicryl and #1 Stratafix sutures with the knee   in flexion.  The  remaining wound was closed with 2-0 Vicryl and running 4-0 Monocryl.   The knee was cleaned, dried, dressed sterilely using Dermabond and   Aquacel  dressing.  The patient was then   brought to recovery room in stable condition, tolerating the procedure   well.   Please note that Physician Assistant, Rosalene Billings, PA-C was present for the entirety of the case, and was utilized for pre-operative positioning, peri-operative retractor management, general facilitation of the procedure and for primary wound closure at the end of the case.              Madlyn Frankel Charlann Boxer, M.D.    11/02/2022 10:08 AM

## 2022-11-02 NOTE — Care Plan (Signed)
Ortho Bundle Case Management Note  Patient Details  Name: [name] MRN: [id number] Date of Birth: 05/16/1940                  R TKA on 11/02/22.  DCP: Home with husband and oldest daughter.  DME: No needs. Has RW and cane.  PT: [facility]   DME Arranged:  N/A DME Agency:      Additional Comments: Please contact me with any questions of if this plan should need to change.    Despina Pole, Case Manager  [facility]  [phone] 11/02/2022, 3:33 PM

## 2022-11-02 NOTE — H&P (Signed)
TOTAL KNEE ADMISSION H&P  Patient is being admitted for right total knee arthroplasty.  Therapy Plans: outpatient therapy at [facility] Disposition: Home with husband and daughter Planned DVT Prophylaxis: Xarelto 10mg  daily DME needed: none PCP: Dr. Okey Dupre - clearance received Cardio: Dr. Tenny Craw - clearance received (PAF - does not tolerate long term eliquis/xarelto, on asa) TXA: IV Allergies: morphine - chest pain Anesthesia Concerns: Has had negative experiences with feeling surgeries despite local/epidural anesthesia BMI: 36 Last HgbA1c: Not diabetic   Other: - Hx of left TKA in 2001 with Dr. Lequita Halt -- Difficult time related to bleeding/hematoma, arthrofibrosis - hx of colon resection - Thinks she had a PE when younger - nothing since (does have frequent a fib episodes) - Remembered having withdrawal symptoms with pain meds last time  Subjective:  Chief Complaint:right knee pain.  HPI: CEDELLA Cooper, 82 y.o. female, has a history of pain and functional disability in the right knee due to arthritis and has failed non-surgical conservative treatments for greater than 12 weeks to includeNSAID's and/or analgesics, corticosteriod injections, and activity modification.  Onset of symptoms was gradual, starting 2 years ago with gradually worsening course since that time. The patient noted prior procedures on the knee to include  arthroscopy and menisectomy on the right knee(s).  Patient currently rates pain in the right knee(s) at 8 out of 10 with activity. Patient has worsening of pain with activity and weight bearing and pain that interferes with activities of daily living.  Patient has evidence of joint space narrowing by imaging studies. There is no active infection.  Patient Active Problem List   Diagnosis Date Noted   Ventral hernia 10/07/2020   Demand ischemia    A-fib (HCC) 04/20/2020   Stage 3b chronic kidney disease (HCC) 04/19/2020   Gouty arthritis of left foot 03/05/2020   Right  hip pain 02/04/2020   Right foot pain 02/04/2020   Left hip pain 08/05/2017   Morbid obesity (HCC) 06/02/2016   Renal cyst 01/12/2016   Chronic back pain 05/23/2015   Degeneration of intervertebral disc of lumbosacral region 04/11/2015   Fibromyalgia 04/11/2015   Routine general medical examination at a health care facility 03/15/2013   Pre-op evaluation 02/10/2011   Hyperlipidemia associated with type 2 diabetes mellitus (HCC) 02/10/2011   SLEEP APNEA, OBSTRUCTIVE 04/24/2010   Type 2 diabetes mellitus with hyperlipidemia (HCC) 12/25/2008   History of malignant neoplasm of large intestine 12/25/2008   Essential hypertension 01/03/2007   Asthma 01/03/2007   Past Medical History:  Diagnosis Date   Allergic rhinitis    Allergy    Anxiety    and panic attacks;pt states that she is claustrophobic   Asthma    Atrial fibrillation (HCC)    Used to take Coumadin-instructed to stop   Cataract    bilateral surgery to remove   Chronic back pain    hx buldging disc   Colon cancer (HCC) 2007   s/p surgery and chemo   Constipation    takes Colace prn   Diabetes mellitus    glimepiride   DJD (degenerative joint disease)    Dyspnea    Dysrhythmia    Fibromyalgia    but doesn't take any meds   Gastric ulcer    GERD (gastroesophageal reflux disease)    uses Tums prn   Gout    takes Colchicine prn   Heart murmur    History of blood clots    right lung in early 90's   History of  colon polyps    History of migraine headaches    last migraine 14yrs ago;Pt states she does get sinus headaches   History of PSVT (paroxysmal supraventricular tachycardia) no current problems   History of shingles    HLD (hyperlipidemia)    diet control - no meds   Hypertension    takes Micardis and Diltiazem daily   Osteoarthritis    Peptic ulcer disease    Peripheral edema    takes Furosemide every other day   Sleep apnea    doesn't use CPAP    Past Surgical History:  Procedure Laterality Date    ANKLE SURGERY     tumor-benign removed left ankle   ANTERIOR CERVICAL DECOMP/DISCECTOMY FUSION  01/12/2011   Procedure: ANTERIOR CERVICAL DECOMPRESSION/DISCECTOMY FUSION 2 LEVELS;  Surgeon: Kathaleen Maser Pool;  Location: [facility];  Service: Neurosurgery;  Laterality: Bilateral;  Cervical five-six, six-seven anterior cervical discectomy and fusion with allograft and plating   ANTERIOR CERVICAL DECOMP/DISCECTOMY FUSION  06/11/2011   Procedure: ANTERIOR CERVICAL DECOMPRESSION/DISCECTOMY FUSION 1 LEVEL/HARDWARE REMOVAL;  Surgeon: Temple Pacini, MD;  Location: [facility];  Service: Neurosurgery;  Laterality: N/A;  Cervical four-five anterior cervical decompression fusion with allograft and plating; Removal of Cervical five to seven plate   bilateral cataracts removed     caesarean section  1966/69/72   x 3   CARDIAC CATHETERIZATION  90's and 2005   CARPAL TUNNEL RELEASE Left 11/2013   cataract surgery  2010   COLECTOMY  2007   colon cancer   COLONOSCOPY     DILATION AND CURETTAGE OF UTERUS  02-19-11   & POLYP REMOVAL   GANGLION CYST EXCISION  > 24yrs ago   removed from left pointer finger   HEEL SPUR SURGERY Right 1992   x 2   HYSTEROSCOPY WITH D & C  02/19/2011   Procedure: DILATATION AND CURETTAGE /HYSTEROSCOPY;  Surgeon: Dara Lords, MD;  Location: [facility];  Service: Gynecology;  Laterality: N/A;  requests one hour   JOINT REPLACEMENT Left    knee   knee arthrosocpy     bil;couple of years before knee replacement   KNEE SURGERY     left TKA   LEFT HEART CATH AND CORONARY ANGIOGRAPHY N/A 04/21/2020   Procedure: LEFT HEART CATH AND CORONARY ANGIOGRAPHY;  Surgeon: Runell Gess, MD;  Location: [facility];  Service: Cardiovascular;  Laterality: N/A;   LUMBAR DISC SURGERY  2008   port a cath placed  2008   port a cath removed  2008   pyelonidal cystectomy     at age 35   SHOULDER ARTHROSCOPY WITH SUBACROMIAL DECOMPRESSION AND OPEN ROTATOR C Right 03/16/2013   Procedure:  RIGHT SHOULDER ARTHROSCOPY WITH SUBACROMIAL DECOMPRESSION AND OPEN ROTATOR CUFF REPAIR, OPEN DISTAL CLAVICLE  RESECTION POSSIBLE BICEP TENODESIS ;  Surgeon: Verlee Rossetti, MD;  Location: [facility];  Service: Orthopedics;  Laterality: Right;   TRIGGER FINGER RELEASE Left    thumb, tendonitis, tumor removed   TUBAL LIGATION  1972    No current facility-administered medications for this encounter.   Current Outpatient Medications  Medication Sig Dispense Refill Last Dose   albuterol (VENTOLIN HFA) 108 (90 Base) MCG/ACT inhaler Inhale 2 puffs into the lungs every 6 (six) hours as needed for wheezing or shortness of breath. 1 each 1    amoxicillin (AMOXIL) 500 MG capsule Take 500 mg by mouth 2 (two) times daily.      Colchicine (MITIGARE) 0.6 MG  CAPS Take 1 capsule (0.6 mg total) by mouth daily as needed (gout flare). 30 capsule 0    diltiazem (CARDIZEM CD) 120 MG 24 hr capsule TAKE 1 CAP DAILY. TAKE W/DILTIAZEM 240MG  FOR 360MG /DAY. MAY TAKE 1 ADDITIONAL CAP AS NEEDED FOR BREAKTHROUGH AFIB 22 capsule 0    diltiazem (CARDIZEM CD) 240 MG 24 hr capsule TAKE 1 CAPSULE EVERY DAY IN ADDITION TO DILTIAZEM 120MG  FOR TOTAL OF 360MG  DAILY (APPOINTMENT IS NEEDED FOR REFILLS) 15 capsule 0    docusate sodium (COLACE) 100 MG capsule Take 100 mg by mouth daily as needed for mild constipation.      hydrocortisone cream 1 % Apply 1 application  topically See admin instructions. 1 application under breast after showers and 1 application as needed for itching      ondansetron (ZOFRAN) 4 MG tablet Take 1 tablet (4 mg total) by mouth every 8 (eight) hours as needed for nausea or vomiting. 20 tablet 0    pravastatin (PRAVACHOL) 20 MG tablet Take 1 tablet (20 mg total) by mouth daily. 90 tablet 3    sodium chloride (OCEAN) 0.65 % SOLN nasal spray Place 1 spray into both nostrils as needed for congestion.      traMADol (ULTRAM) 50 MG tablet Take 1 tablet (50 mg total) by mouth daily as needed. 30 tablet 5    Blood Glucose  Monitoring Suppl (TRUE METRIX METER) w/Device KIT 1 each by Does not apply route in the morning and at bedtime. 1 kit 0    glucose blood (TRUE METRIX BLOOD GLUCOSE TEST) test strip TEST BLOOD SUGAR AS DIRECTED (ANNUAL APPT DUE IN MAY, SEE MD FOR FUTURE REFILLS) 200 strip 5    Lancets (ACCU-CHEK SOFT TOUCH) lancets Use to check blood sugars twice day 300 each 1    losartan (COZAAR) 100 MG tablet Take 1 tablet (100 mg total) by mouth daily. 90 tablet 3    triamcinolone ointment (KENALOG) 0.5 % Apply 1 application. topically 2 (two) times daily. (Patient not taking: Reported on 10/12/2022) 30 g 0 Not Taking   Allergies  Allergen Reactions   Benazepril Cough   Lopressor [Metoprolol] Other (See Comments)    nervousness   Adhesive [Tape] Other (See Comments)    redness   Morphine Other (See Comments)       Makes you feel bad all over   Nifedipine Hives and Itching   Piroxicam Hives and Itching    Social History   Tobacco Use   Smoking status: Former    Current packs/day: 0.00    Average packs/day: 1.5 packs/day for 30.0 years (45.0 ttl pk-yrs)    Types: Cigarettes    Start date: 02/22/1961    Quit date: 02/23/1991    Years since quitting: 31.7   Smokeless tobacco: Never  Substance Use Topics   Alcohol use: No    Alcohol/week: 0.0 standard drinks of alcohol    Family History  Problem Relation Age of Onset   Heart attack Mother    Stroke Mother    Hypertension Mother    Heart disease Father    Hypertension Sister    Kidney cancer Sister        left   Bladder Cancer Brother    Liver cancer Brother        spread to colon   Coronary artery disease Other    Anesthesia problems Neg Hx    Hypotension Neg Hx    Malignant hyperthermia Neg Hx    Pseudochol deficiency Neg  Hx    Stomach cancer Neg Hx    Rectal cancer Neg Hx      Review of Systems  Constitutional:  Negative for chills and fever.  Respiratory:  Negative for cough and shortness of breath.   Cardiovascular:  Negative  for chest pain.  Gastrointestinal:  Negative for nausea and vomiting.  Musculoskeletal:  Positive for arthralgias.     Objective:  Physical Exam Well nourished and well developed. General: Alert and oriented x3, cooperative and pleasant, no acute distress. Head: normocephalic, atraumatic, neck supple. Eyes: EOMI.  Musculoskeletal: Right knee exam: Mild to moderate effusion without erythema or warmth Significant genu valgum with passive correctable deformity with pain Slight flexion contracture with flexion over 110 degrees with tightness and crepitation over the anterior lateral aspect the knee Mild lower extremity edema without erythema and no calf tenderness  Calves soft and nontender. Motor function intact in LE. Strength 5/5 LE bilaterally. Neuro: Distal pulses 2+. Sensation to light touch intact in LE.  Vital signs in last 24 hours:    Labs:   Estimated body mass index is 34.72 kg/m as calculated from the following:   Height as of 10/21/22: 5\' 3"  (1.6 m).   Weight as of 10/21/22: 88.9 kg.   Imaging Review Plain radiographs demonstrate severe degenerative joint disease of the right knee(s). The overall alignment isneutral. The bone quality appears to be adequate for age and reported activity level.      Assessment/Plan:  End stage arthritis, right knee   The patient history, physical examination, clinical judgment of the provider and imaging studies are consistent with end stage degenerative joint disease of the right knee(s) and total knee arthroplasty is deemed medically necessary. The treatment options including medical management, injection therapy arthroscopy and arthroplasty were discussed at length. The risks and benefits of total knee arthroplasty were presented and reviewed. The risks due to aseptic loosening, infection, stiffness, patella tracking problems, thromboembolic complications and other imponderables were discussed. The patient acknowledged the  explanation, agreed to proceed with the plan and consent was signed. Patient is being admitted for inpatient treatment for surgery, pain control, PT, OT, prophylactic antibiotics, VTE prophylaxis, progressive ambulation and ADL's and discharge planning. The patient is planning to be discharged  home.     Patient's anticipated LOS is less than 2 midnights, meeting these requirements: - Younger than 105 - Lives within 1 hour of care - Has a competent adult at home to recover with post-op recover - NO history of  - Chronic pain requiring opiods  - Diabetes  - Coronary Artery Disease  - Heart failure  - Heart attack  - Stroke  - DVT/VTE  - Cardiac arrhythmia  - Respiratory Failure/COPD  - Renal failure  - Anemia  - Advanced Liver disease   Rosalene Billings, PA-C Orthopedic Surgery [facility] [place] [phone]

## 2022-11-02 NOTE — Transfer of Care (Signed)
Immediate Anesthesia Transfer of Care Note  Patient: [name]  Procedure(s) Performed: TOTAL KNEE ARTHROPLASTY (Right: Knee)  Patient Location: PACU  Anesthesia Type:Regional, Spinal, and MAC combined with regional for post-op pain  Level of Consciousness: awake and patient cooperative  Airway & Oxygen Therapy: Patient Spontanous Breathing and Patient connected to face mask oxygen  Post-op Assessment: Report given to RN and Patient moving all extremities  Post vital signs: Reviewed and stable  Last Vitals:  Vitals Value Taken Time  BP 149/70 11/02/22 1400  Temp    Pulse 60 11/02/22 1402  Resp 17 11/02/22 1402  SpO2 99 % 11/02/22 1402  Vitals shown include unfiled device data.  Last Pain:  Vitals:   11/02/22 0925  TempSrc: Oral         Complications: No notable events documented.

## 2022-11-02 NOTE — Anesthesia Procedure Notes (Signed)
Procedure Name: General with mask airway Date/Time: 11/02/2022 11:52 AM  Performed by: Lily Lovings, CRNAPre-anesthesia Checklist: Patient identified, Emergency Drugs available, Suction available, Patient being monitored and Timeout performed Patient Re-evaluated:Patient Re-evaluated prior to induction Oxygen Delivery Method: Simple face mask Preoxygenation: Pre-oxygenation with 100% oxygen Induction Type: IV induction

## 2022-11-03 ENCOUNTER — Encounter (HOSPITAL_COMMUNITY): Payer: Self-pay | Admitting: Orthopedic Surgery

## 2022-11-03 DIAGNOSIS — I4891 Unspecified atrial fibrillation: Secondary | ICD-10-CM | POA: Diagnosis not present

## 2022-11-03 DIAGNOSIS — Z87891 Personal history of nicotine dependence: Secondary | ICD-10-CM | POA: Diagnosis not present

## 2022-11-03 DIAGNOSIS — J45909 Unspecified asthma, uncomplicated: Secondary | ICD-10-CM | POA: Diagnosis not present

## 2022-11-03 DIAGNOSIS — Z96652 Presence of left artificial knee joint: Secondary | ICD-10-CM | POA: Diagnosis not present

## 2022-11-03 DIAGNOSIS — Z85038 Personal history of other malignant neoplasm of large intestine: Secondary | ICD-10-CM | POA: Diagnosis not present

## 2022-11-03 DIAGNOSIS — I129 Hypertensive chronic kidney disease with stage 1 through stage 4 chronic kidney disease, or unspecified chronic kidney disease: Secondary | ICD-10-CM | POA: Diagnosis not present

## 2022-11-03 DIAGNOSIS — N1832 Chronic kidney disease, stage 3b: Secondary | ICD-10-CM | POA: Diagnosis not present

## 2022-11-03 DIAGNOSIS — Z79899 Other long term (current) drug therapy: Secondary | ICD-10-CM | POA: Diagnosis not present

## 2022-11-03 DIAGNOSIS — M1711 Unilateral primary osteoarthritis, right knee: Secondary | ICD-10-CM | POA: Diagnosis not present

## 2022-11-03 LAB — BASIC METABOLIC PANEL
Anion gap: 11 (ref 5–15)
BUN: 23 mg/dL (ref 8–23)
CO2: 22 mmol/L (ref 22–32)
Calcium: 9 mg/dL (ref 8.9–10.3)
Chloride: 100 mmol/L (ref 98–111)
Creatinine, Ser: 1.15 mg/dL — ABNORMAL HIGH (ref 0.44–1.00)
GFR, Estimated: 48 mL/min — ABNORMAL LOW (ref >60)
Glucose, Bld: 212 mg/dL — ABNORMAL HIGH (ref 70–99)
Potassium: 4 mmol/L (ref 3.5–5.1)
Sodium: 133 mmol/L — ABNORMAL LOW (ref 135–145)

## 2022-11-03 LAB — CBC
HCT: 35.3 % — ABNORMAL LOW (ref 36.0–46.0)
Hemoglobin: 11.5 g/dL — ABNORMAL LOW (ref 12.0–15.0)
MCH: 29.4 pg (ref 26.0–34.0)
MCHC: 32.6 g/dL (ref 30.0–36.0)
MCV: 90.3 fL (ref 80.0–100.0)
Platelets: 214 10*3/uL (ref 150–400)
RBC: 3.91 MIL/uL (ref 3.87–5.11)
RDW: 13 % (ref 11.5–15.5)
WBC: 10.6 10*3/uL — ABNORMAL HIGH (ref 4.0–10.5)
nRBC: 0 % (ref 0.0–0.2)

## 2022-11-03 LAB — GLUCOSE, CAPILLARY: Glucose-Capillary: 209 mg/dL — ABNORMAL HIGH (ref 70–99)

## 2022-11-03 MED ORDER — METHOCARBAMOL 500 MG PO TABS: 500.0000 mg | ORAL_TABLET | Freq: Four times a day (QID) | ORAL | 2 refills | Status: DC | PRN

## 2022-11-03 MED ORDER — OXYCODONE HCL 5 MG PO TABS: 5.0000 mg | ORAL_TABLET | ORAL | 0 refills | Status: DC | PRN

## 2022-11-03 MED ORDER — POLYETHYLENE GLYCOL 3350 17 G PO PACK: 17.0000 g | PACK | Freq: Two times a day (BID) | ORAL | 0 refills | Status: DC

## 2022-11-03 MED ORDER — SENNA 8.6 MG PO TABS: 2.0000 | ORAL_TABLET | Freq: Every day | ORAL | 0 refills | Status: DC

## 2022-11-03 MED ORDER — RIVAROXABAN 10 MG PO TABS: 10.0000 mg | ORAL_TABLET | Freq: Every day | ORAL | 0 refills | Status: DC

## 2022-11-03 NOTE — Plan of Care (Signed)
  Problem: Education: Goal: Ability to describe self-care measures that may prevent or decrease complications (Diabetes Survival Skills Education) will improve Outcome: Progressing   Problem: Education: Goal: Knowledge of the prescribed therapeutic regimen will improve Outcome: Progressing   Problem: Activity: Goal: Ability to avoid complications of mobility impairment will improve Outcome: Progressing   

## 2022-11-03 NOTE — TOC Transition Note (Signed)
Transition of Care [facility]) - CM/SW Discharge Note  Patient Details  Name: [name] MRN: [id number] Date of Birth: 07-Jul-1940  [facility]) CM/SW Contact:  Ewing Schlein, LCSW Phone Number: 11/03/2022, 10:22 AM  Clinical Narrative: Patient is expected to discharge home after working with PT. CSW met with patient and family to confirm discharge plan. Patient will go home with OPPT at [facility]. Patient has a rolling walker and cane at home, so there are no DME needs at this time. TOC signing off.    Final next level of care: OP Rehab Barriers to Discharge: No Barriers Identified  Patient Goals and CMS Choice Choice offered to / list presented to : NA  Discharge Plan and Services Additional resources added to the After Visit Summary for          DME Arranged: N/A DME Agency: NA  Social Determinants of Health (SDOH) Interventions SDOH Screenings   Food Insecurity: No Food Insecurity (11/02/2022)  Housing: Low Risk  (11/02/2022)  Transportation Needs: No Transportation Needs (11/02/2022)  Utilities: Not At Risk (11/02/2022)  Alcohol Screen: Low Risk  (07/13/2022)  Depression (PHQ2-9): Low Risk  (10/05/2022)  Financial Resource Strain: Low Risk  (07/13/2022)  Physical Activity: Inactive (07/13/2022)  Social Connections: Socially Integrated (07/13/2022)  Stress: No Stress Concern Present (07/13/2022)  Tobacco Use: Medium Risk (11/02/2022)   Readmission Risk Interventions     No data to display

## 2022-11-03 NOTE — Progress Notes (Signed)
Physical Therapy Treatment Patient Details Name: [name] MRN: [id number] DOB: 04-Aug-1940 Today's Date: 11/03/2022   History of Present Illness 82 yo female presents to therapy s/p R TKA on 11/02/2022 due to failure of conservative measures. Pt PMH includes but is not limited to: A-fib, CKD III, gout, chronic back pain s/p ACDF and lumbar surgery, fibromyalgia, HLD, DM II, OSA, colon ca s/p resection and chemo, HTN, L TKA, and R shoulder arthroscopy.    PT Comments  Pt is progressing well with mobility and is ready to DC home from a PT standpoint. She ambulated 46' with RW, completed stair training, and demonstrates good understanding of HEP.     If plan is discharge home, recommend the following: A little help with bathing/dressing/bathroom;Assistance with cooking/housework;Assist for transportation;Help with stairs or ramp for entrance   Can travel by private vehicle        Equipment Recommendations  Rolling walker (2 wheels) (may benefit from youth height)    Recommendations for Other Services       Precautions / Restrictions Precautions Precautions: Knee;Fall Precaution Booklet Issued: Yes (comment) Precaution Comments: reviewed no pillow under knee with pt and daughter Restrictions Weight Bearing Restrictions: No     Mobility  Bed Mobility Overal bed mobility: Modified Independent Bed Mobility: Supine to Sit     Supine to sit: HOB elevated, Used rails, Modified independent (Device/Increase time)          Transfers Overall transfer level: Needs assistance Equipment used: Rolling walker (2 wheels) Transfers: Sit to/from Stand Sit to Stand: Supervision           General transfer comment: VCs hand placement    Ambulation/Gait Ambulation/Gait assistance: Supervision Gait Distance (Feet): 130 Feet Assistive device: Rolling walker (2 wheels) Gait Pattern/deviations: Step-to pattern, Antalgic, Trunk flexed Gait velocity: decreased     General Gait  Details: steady, no loss of balance   Stairs Stairs: Yes Stairs assistance: Min assist Stair Management: No rails, Backwards, With walker Number of Stairs: 1 General stair comments: VCs sequencing, daughter present and steadied RW   Wheelchair Mobility     Tilt Bed    Modified Rankin (Stroke Patients Only)       Balance Overall balance assessment: Needs assistance Sitting-balance support: Feet supported Sitting balance-Leahy Scale: Good     Standing balance support: Bilateral upper extremity supported, During functional activity, Reliant on assistive device for balance Standing balance-Leahy Scale: Poor                              Cognition Arousal: Alert Behavior During Therapy: WFL for tasks assessed/performed Overall Cognitive Status: Within Functional Limits for tasks assessed                                          Exercises Total Joint Exercises Ankle Circles/Pumps: AROM, Both, 15 reps, Supine Quad Sets: AROM, Both, 5 reps, Supine Short Arc Quad: AROM, Right, 10 reps, Supine Heel Slides: AAROM, Right, 5 reps, Supine Hip ABduction/ADduction: AAROM, Right, 5 reps, Supine Straight Leg Raises: AROM, Right, 5 reps, Supine Long Arc Quad: AROM, Right, 5 reps, Seated Knee Flexion: AAROM, Right, 10 reps, Seated Goniometric ROM: 5-80* AAROM R Knee    General Comments        Pertinent Vitals/Pain Pain Assessment Pain Score: 4  Pain Location: R knee Pain  Descriptors / Indicators: Aching, Constant, Discomfort, Operative site guarding Pain Intervention(s): Limited activity within patient's tolerance, Monitored during session, Premedicated before session, Ice applied    Home Living                          Prior Function            PT Goals (current goals can now be found in the care plan section) Acute Rehab PT Goals Patient Stated Goal: to be able to get back to yard work and plant new flower beds in the  spring PT Goal Formulation: With patient Time For Goal Achievement: 11/16/22 Potential to Achieve Goals: Good Progress towards PT goals: Goals met/education completed, patient discharged from PT    Frequency    7X/week      PT Plan      Co-evaluation              AM-PAC PT "6 Clicks" Mobility   Outcome Measure  Help needed turning from your back to your side while in a flat bed without using bedrails?: None Help needed moving from lying on your back to sitting on the side of a flat bed without using bedrails?: A Little Help needed moving to and from a bed to a chair (including a wheelchair)?: None Help needed standing up from a chair using your arms (e.g., wheelchair or bedside chair)?: None Help needed to walk in hospital room?: None Help needed climbing 3-5 steps with a railing? : A Little 6 Click Score: 22    End of Session Equipment Utilized During Treatment: Gait belt Activity Tolerance: Patient tolerated treatment well;No increased pain Patient left: in chair;with call bell/phone within reach;with family/visitor present Nurse Communication: Mobility status PT Visit Diagnosis: Unsteadiness on feet (R26.81);Other abnormalities of gait and mobility (R26.89);Difficulty in walking, not elsewhere classified (R26.2);Pain Pain - Right/Left: Right Pain - part of body: Knee     Time: 0900-0929 PT Time Calculation (min) (ACUTE ONLY): 29 min  Charges:    $Gait Training: 8-22 mins $Therapeutic Exercise: 8-22 mins PT General Charges $$ ACUTE PT VISIT: 1 Visit                     Ralene Bathe Kistler PT 11/03/2022  Acute Rehabilitation Services  Office [phone]

## 2022-11-05 DIAGNOSIS — J4521 Mild intermittent asthma with (acute) exacerbation: Secondary | ICD-10-CM | POA: Diagnosis not present

## 2022-11-08 ENCOUNTER — Ambulatory Visit: Payer: Medicare HMO | Admitting: Internal Medicine

## 2022-11-08 DIAGNOSIS — M25561 Pain in right knee: Secondary | ICD-10-CM | POA: Diagnosis not present

## 2022-11-08 DIAGNOSIS — M25661 Stiffness of right knee, not elsewhere classified: Secondary | ICD-10-CM | POA: Diagnosis not present

## 2022-11-08 NOTE — Discharge Summary (Signed)
Patient ID: [name] MRN: [id number] DOB/AGE: 1941-02-15 82 y.o.  Admit date: 11/02/2022 Discharge date: 11/03/2022  Admission Diagnoses:  Right knee osteoarthritis  Discharge Diagnoses:  Principal Problem:   S/P total knee arthroplasty, right   Past Medical History:  Diagnosis Date   Allergic rhinitis    Allergy    Anxiety    and panic attacks;pt states that she is claustrophobic   Asthma    Atrial fibrillation (HCC)    Used to take Coumadin-instructed to stop   Cataract    bilateral surgery to remove   Chronic back pain    hx buldging disc   Colon cancer (HCC) 2007   s/p surgery and chemo   Constipation    takes Colace prn   Diabetes mellitus    glimepiride   DJD (degenerative joint disease)    Dyspnea    Dysrhythmia    Fibromyalgia    but doesn't take any meds   Gastric ulcer    GERD (gastroesophageal reflux disease)    uses Tums prn   Gout    takes Colchicine prn   Heart murmur    History of blood clots    right lung in early 90's   History of colon polyps    History of migraine headaches    last migraine 50yrs ago;Pt states she does get sinus headaches   History of PSVT (paroxysmal supraventricular tachycardia) no current problems   History of shingles    HLD (hyperlipidemia)    diet control - no meds   Hypertension    takes Micardis and Diltiazem daily   Osteoarthritis    Peptic ulcer disease    Peripheral edema    takes Furosemide every other day   Sleep apnea    doesn't use CPAP    Surgeries: Procedure(s): TOTAL KNEE ARTHROPLASTY on 11/02/2022   Consultants:   Discharged Condition: Improved  Hospital Course: [name] is an 82 y.o. female who was admitted 11/02/2022 for operative treatment ofS/P total knee arthroplasty, right. Patient has severe unremitting pain that affects sleep, daily activities, and work/hobbies. After pre-op clearance the patient was taken to the operating room on 11/02/2022 and underwent  Procedure(s): TOTAL  KNEE ARTHROPLASTY.    Patient was given perioperative antibiotics:  Anti-infectives (From admission, onward)    Start     Dose/Rate Route Frequency Ordered Stop   11/02/22 1800  ceFAZolin (ANCEF) IVPB 2g/100 mL premix        2 g 200 mL/hr over 30 Minutes Intravenous Every 6 hours 11/02/22 1526 11/03/22 0000   11/02/22 0945  ceFAZolin (ANCEF) IVPB 2g/100 mL premix        2 g 200 mL/hr over 30 Minutes Intravenous On call to O.R. 11/02/22 0932 11/02/22 1217        Patient was given sequential compression devices, early ambulation, and chemoprophylaxis to prevent DVT. Patient worked with PT and was meeting their goals regarding safe ambulation and transfers.  Patient benefited maximally from hospital stay and there were no complications.    Recent vital signs: No data found.   Recent laboratory studies: No results for input(s): "WBC", "HGB", "HCT", "PLT", "NA", "K", "CL", "CO2", "BUN", "CREATININE", "GLUCOSE", "INR", "CALCIUM" in the last 72 hours.  Invalid input(s): "PT", "2"   Discharge Medications:   Allergies as of 11/03/2022       Reactions   Benazepril Cough   Lopressor [metoprolol] Other (See Comments)   nervousness   Adhesive [tape] Other (See Comments)  redness   Morphine Other (See Comments)      Makes you feel bad all over   Nifedipine Hives, Itching   Piroxicam Hives, Itching        Medication List     STOP taking these medications    traMADol 50 MG tablet Commonly known as: ULTRAM       TAKE these medications    accu-chek soft touch lancets Use to check blood sugars twice day   albuterol 108 (90 Base) MCG/ACT inhaler Commonly known as: VENTOLIN HFA Inhale 2 puffs into the lungs every 6 (six) hours as needed for wheezing or shortness of breath.   amoxicillin 500 MG capsule Commonly known as: AMOXIL Take 500 mg by mouth 2 (two) times daily.   Colchicine 0.6 MG Caps Commonly known as: Mitigare Take 1 capsule (0.6 mg total) by mouth daily as  needed (gout flare).   diltiazem 240 MG 24 hr capsule Commonly known as: CARDIZEM CD TAKE 1 CAPSULE EVERY DAY IN ADDITION TO DILTIAZEM 120MG  FOR TOTAL OF 360MG  DAILY (APPOINTMENT IS NEEDED FOR REFILLS)   diltiazem 120 MG 24 hr capsule Commonly known as: CARDIZEM CD TAKE 1 CAP DAILY. TAKE W/DILTIAZEM 240MG  FOR 360MG /DAY. MAY TAKE 1 ADDITIONAL CAP AS NEEDED FOR BREAKTHROUGH AFIB   docusate sodium 100 MG capsule Commonly known as: COLACE Take 100 mg by mouth daily as needed for mild constipation.   hydrocortisone cream 1 % Apply 1 application  topically See admin instructions. 1 application under breast after showers and 1 application as needed for itching   losartan 100 MG tablet Commonly known as: COZAAR Take 1 tablet (100 mg total) by mouth daily.   methocarbamol 500 MG tablet Commonly known as: ROBAXIN Take 1 tablet (500 mg total) by mouth every 6 (six) hours as needed for muscle spasms.   ondansetron 4 MG tablet Commonly known as: Zofran Take 1 tablet (4 mg total) by mouth every 8 (eight) hours as needed for nausea or vomiting.   oxyCODONE 5 MG immediate release tablet Commonly known as: Oxy IR/ROXICODONE Take 1 tablet (5 mg total) by mouth every 4 (four) hours as needed for severe pain.   polyethylene glycol 17 g packet Commonly known as: MIRALAX / GLYCOLAX Take 17 g by mouth 2 (two) times daily.   pravastatin 20 MG tablet Commonly known as: PRAVACHOL Take 1 tablet (20 mg total) by mouth daily.   rivaroxaban 10 MG Tabs tablet Commonly known as: XARELTO Take 1 tablet (10 mg total) by mouth daily with breakfast for 14 days. Then take aspirin 81 mg twice daily for a month.   senna 8.6 MG Tabs tablet Commonly known as: SENOKOT Take 2 tablets (17.2 mg total) by mouth at bedtime.   sodium chloride 0.65 % Soln nasal spray Commonly known as: OCEAN Place 1 spray into both nostrils as needed for congestion.   triamcinolone ointment 0.5 % Commonly known as:  KENALOG Apply 1 application. topically 2 (two) times daily.   True Metrix Blood Glucose Test test strip Generic drug: glucose blood TEST BLOOD SUGAR AS DIRECTED (ANNUAL APPT DUE IN MAY, SEE MD FOR FUTURE REFILLS)   True Metrix Meter w/Device Kit 1 each by Does not apply route in the morning and at bedtime.               Discharge Care Instructions  (From admission, onward)           Start     Ordered   11/03/22 0000  Change dressing       Comments: Maintain surgical dressing until follow up in the clinic. If the edges start to pull up, may reinforce with tape. If the dressing is no longer working, may remove and cover with gauze and tape, but must keep the area dry and clean.  Call with any questions or concerns.   11/03/22 1114            Diagnostic Studies: No results found.  Disposition: Discharge disposition: 01-Home or Self Care       Discharge Instructions     Call MD / Call 911   Complete by: As directed    If you experience chest pain or shortness of breath, CALL 911 and be transported to the hospital emergency room.  If you develope a fever above 101 F, pus (white drainage) or increased drainage or redness at the wound, or calf pain, call your surgeon's office.   Change dressing   Complete by: As directed    Maintain surgical dressing until follow up in the clinic. If the edges start to pull up, may reinforce with tape. If the dressing is no longer working, may remove and cover with gauze and tape, but must keep the area dry and clean.  Call with any questions or concerns.   Constipation Prevention   Complete by: As directed    Drink plenty of fluids.  Prune juice may be helpful.  You may use a stool softener, such as Colace (over the counter) 100 mg twice a day.  Use MiraLax (over the counter) for constipation as needed.   Diet - low sodium heart healthy   Complete by: As directed    Increase activity slowly as tolerated   Complete by: As directed     Weight bearing as tolerated with assist device (walker, cane, etc) as directed, use it as long as suggested by your surgeon or therapist, typically at least 4-6 weeks.   Post-operative opioid taper instructions:   Complete by: As directed    POST-OPERATIVE OPIOID TAPER INSTRUCTIONS: It is important to wean off of your opioid medication as soon as possible. If you do not need pain medication after your surgery it is ok to stop day one. Opioids include: Codeine, Hydrocodone(Norco, Vicodin), Oxycodone(Percocet, oxycontin) and hydromorphone amongst others.  Long term and even short term use of opiods can cause: Increased pain response Dependence Constipation Depression Respiratory depression And more.  Withdrawal symptoms can include Flu like symptoms Nausea, vomiting And more Techniques to manage these symptoms Hydrate well Eat regular healthy meals Stay active Use relaxation techniques(deep breathing, meditating, yoga) Do Not substitute Alcohol to help with tapering If you have been on opioids for less than two weeks and do not have pain than it is ok to stop all together.  Plan to wean off of opioids This plan should start within one week post op of your joint replacement. Maintain the same interval or time between taking each dose and first decrease the dose.  Cut the total daily intake of opioids by one tablet each day Next start to increase the time between doses. The last dose that should be eliminated is the evening dose.      TED hose   Complete by: As directed    Use stockings (TED hose) for 2 weeks on both leg(s).  You may remove them at night for sleeping.        Follow-up Information     Durene Romans, MD. Go on 11/15/2022.  Specialty: Orthopedic Surgery Why: You are scheduled for first post op appt on Monday Sept 23 at 9:30am. Contact information: [address] [place] [address] [phone]                  Signed: Cassandria Anger 11/08/2022, 2:59 PM

## 2022-11-10 DIAGNOSIS — M25661 Stiffness of right knee, not elsewhere classified: Secondary | ICD-10-CM | POA: Diagnosis not present

## 2022-11-10 DIAGNOSIS — M25561 Pain in right knee: Secondary | ICD-10-CM | POA: Diagnosis not present

## 2022-11-17 DIAGNOSIS — M25561 Pain in right knee: Secondary | ICD-10-CM | POA: Diagnosis not present

## 2022-11-17 DIAGNOSIS — M25661 Stiffness of right knee, not elsewhere classified: Secondary | ICD-10-CM | POA: Diagnosis not present

## 2022-12-08 ENCOUNTER — Telehealth: Payer: Self-pay | Admitting: Internal Medicine

## 2022-12-08 NOTE — Telephone Encounter (Signed)
STAT if HR is under 50 or over 120 (normal HR is 60-100 beats per minute)  What is your heart rate? 111  Do you have a log of your heart rate readings (document readings)? 93, 104, 98  Do you have any other symptoms? Pt states she is experiencing high heart rate and sometimes her Bp will drop randomly. Please advise

## 2022-12-08 NOTE — Telephone Encounter (Signed)
Pt called to report that since her knee replacement on 11/02/22 she has been having an irregular heart beat and fluctuating BP sometimes peaking about 170 systolic and 110 diastolic... she denies SOB, no dizziness,,, but she has pain between her shoulder blades on and off with exertion but relieved with rest.   She will see our DOD tomorrow at 1:30 om but until then if anything changes or worsens she will go to the ED for more immediate assessment.

## 2022-12-09 ENCOUNTER — Ambulatory Visit: Payer: Medicare HMO | Attending: Cardiology | Admitting: Cardiology

## 2022-12-09 ENCOUNTER — Encounter: Payer: Self-pay | Admitting: Cardiology

## 2022-12-09 ENCOUNTER — Other Ambulatory Visit: Payer: Self-pay | Admitting: Cardiology

## 2022-12-09 ENCOUNTER — Ambulatory Visit: Payer: Medicare HMO | Attending: Cardiology

## 2022-12-09 VITALS — BP 142/78 | HR 90 | Ht 63.0 in | Wt 184.2 lb

## 2022-12-09 DIAGNOSIS — R072 Precordial pain: Secondary | ICD-10-CM

## 2022-12-09 DIAGNOSIS — Q2381 Bicuspid aortic valve: Secondary | ICD-10-CM | POA: Diagnosis not present

## 2022-12-09 DIAGNOSIS — I10 Essential (primary) hypertension: Secondary | ICD-10-CM | POA: Diagnosis not present

## 2022-12-09 DIAGNOSIS — I480 Paroxysmal atrial fibrillation: Secondary | ICD-10-CM | POA: Diagnosis not present

## 2022-12-09 NOTE — Progress Notes (Signed)
Cardiology Office Note:  .   Date:  12/09/2022  ID:  [name], DOB 03-26-1940, MRN [id number] PCP: Myrlene Broker, MD  [facility] Providers Cardiologist:  Seen by multiple [facility] providers PCP: Myrlene Broker, MD  Chief Complaint  Patient presents with   Chest Pain      History of Present Illness: .    [name] is a 82 y.o. female with hypertension, hyperlipidemia, type 2 diabetes mellitus, PAF, OSA, remote history of PE, peptic ulcer disease  Patient was most recently seen by Jari Favre, PA-C in 09/2022 for preop evaluation.  At that time, she was noted to have good functional capacity, and deemed low risk for upcoming left knee surgery. Patient made today's appointment due to pain between her shoulder bladed and irregular heart rate.  Patient has multiple complaitns today.  Patient underwent knee surgery without any perioperative cardiac events.  However, she has had a rough few weeks with ehr Rt knee surgery and related pain. She is still having some pain in her knee. She has noticed that there are episodes her blood pressure and heart rate run hgiher than usual, at rest.  At other times, she has episodes of pain in her upper chest and upper back radiating to both arms during her usual house chores, and improves with rest.  During these episodes, she notices her blood pressure is down to SBP in 80s.  She reports drinking only 2-3 bottles of water every day.  Vitals:   12/09/22 1339  BP: (!) 142/78  Pulse: 90  SpO2: 98%     ROS:  Review of Systems  Cardiovascular:  Positive for chest pain (and shouldre pain). Negative for dyspnea on exertion, leg swelling, palpitations and syncope.     Studies Reviewed: [name]       Labs 10/2022  EKG 12/09/2022: Normal sinus rhythm Low voltage QRS Poor R wave progression When compared with ECG of 30-Sep-2022 10:03, No significant change since   Echocardiogram 03/2020:  1. Possible fusion of  noncoronary and left coronary cusps but no  significant AS by doppler.   2. Left ventricular ejection fraction, by estimation, is 60 to 65%. The  left ventricle has normal function. The left ventricle has no regional  wall motion abnormalities. There is mild left ventricular hypertrophy.  Left ventricular diastolic function  could not be evaluated.   3. Right ventricular systolic function is normal. The right ventricular  size is normal. Tricuspid regurgitation signal is inadequate for assessing  PA pressure.   4. The mitral valve is normal in structure. Trivial mitral valve  regurgitation. No evidence of mitral stenosis.   5. The aortic valve is abnormal. Aortic valve regurgitation is trivial.  Mild to moderate aortic valve sclerosis/calcification is present, without  any evidence of aortic stenosis.   6. The inferior vena cava is normal in size with greater than 50%  respiratory variability, suggesting right atrial pressure of 3 mmHg.    Risk Assessment/Calculations:    CHA2DS2-VASc Score = 5  This indicates a 7.2% annual risk of stroke. The patient's score is based upon: CHF History: 0 HTN History: 1 Diabetes History: 1 Stroke History: 0 Vascular Disease History: 0 Age Score: 2 Gender Score: 1      Physical Exam:   Physical Exam Vitals and nursing note reviewed.  Constitutional:      General: She is not in acute distress. Neck:     Vascular: No JVD.  Cardiovascular:  Rate and Rhythm: Normal rate and regular rhythm.     Heart sounds: Normal heart sounds. No murmur heard. Pulmonary:     Effort: Pulmonary effort is normal.     Breath sounds: Normal breath sounds. No wheezing or rales.  Musculoskeletal:     Right lower leg: No edema.     Left lower leg: No edema.      VISIT DIAGNOSES:   ICD-10-CM   1. PAF (paroxysmal atrial fibrillation) (HCC)  I48.0 EKG 12-Lead    2. Essential hypertension  I10     3. Paroxysmal atrial fibrillation (HCC)  I48.0         ASSESSMENT AND PLAN: .    [name] is a 82 y.o. female with hypertension, hyperlipidemia, type 2 diabetes mellitus, PAF, OSA, remote history of PE, peptic ulcer disease  PAF: Currently on Diltiazem 360 mg + additional 120 mg as needed. Recent episodes of rapid heart rate could be sinus tachycardia in setting of pain, or could be paroxysmal A-fib. CHA2DS2-VASc Score = 5 . This indicates a 7.2% annual risk of stroke. She is not on anticoagulation at this time. She reportelfy had fatigue due to Afib and was not interested in [place] procedure as of 09/2022.  This may be discussed further showed 2-week Zio monitor shows no A-fib.  Chest/shoulder pain: Symptoms are not clearly related to angina.  However, she has respecters for CAD and episodes of exertional hypertension.  In the setting of her known bicuspid valve, I recommend echocardiogram, as well as Lexiscan nuclear stress test.  She is unable to walk on the treadmill due to her recent knee surgery.  Separately, have asked her to increase hydration due to episodes of hypotension.   Informed Consent   Shared Decision Making/Informed Consent{ The risks [chest pain, shortness of breath, cardiac arrhythmias, dizziness, blood pressure fluctuations, myocardial infarction, stroke/transient ischemic attack, nausea, vomiting, allergic reaction, radiation exposure, metallic taste sensation and life-threatening complications (estimated to be 1 in 10,000)], benefits (risk stratification, diagnosing coronary artery disease, treatment guidance) and alternatives of a nuclear stress test were discussed in detail with Ms. Mumby and she agrees to proceed.       No orders of the defined types were placed in this encounter.    F/u in 3 months  Signed, Elder Negus, MD

## 2022-12-09 NOTE — Progress Notes (Unsigned)
ZIO XT serial # [id number] from office inventory applied to patient using tincture of benzoin.

## 2022-12-09 NOTE — Patient Instructions (Signed)
Medication Instructions:  Your physician recommends that you continue on your current medications as directed. Please refer to the Current Medication list given to you today.  *If you need a refill on your cardiac medications before your next appointment, please call your pharmacy*  Lab Work: If you have labs (blood work) drawn today and your tests are completely normal, you will receive your results only by: MyChart Message (if you have MyChart) OR A paper copy in the mail If you have any lab test that is abnormal or we need to change your treatment, we will call you to review the results.  Testing/Procedures: Your physician has requested that you have an echocardiogram. Echocardiography is a painless test that uses sound waves to create images of your heart. It provides your doctor with information about the size and shape of your heart and how well your heart's chambers and valves are working. This procedure takes approximately one hour. There are no restrictions for this procedure. Please do NOT wear cologne, perfume, aftershave, or lotions (deodorant is allowed). Please arrive 15 minutes prior to your appointment time.  Your physician has requested that you have a lexiscan myoview. For further information please visit [email or web address] Please follow instruction sheet, as given.  Your physician has recommended that you wear a monitor. Monitors are medical devices that record the heart's electrical activity. Doctors most often [place] these monitors to diagnose arrhythmias. Arrhythmias are problems with the speed or rhythm of the heartbeat. The monitor is a small, portable device. You can wear one while you do your normal daily activities. This is usually used to diagnose what is causing palpitations/syncope (passing out).  Follow-Up: At [facility], you and your health needs are our priority.  As part of our continuing mission to provide you with exceptional heart care, we have  created designated Provider Care Teams.  These Care Teams include your primary Cardiologist (physician) and Advanced Practice Providers (APPs -  Physician Assistants and Nurse Practitioners) who all work together to provide you with the care you need, when you need it.  We recommend signing up for the patient portal called "MyChart".  Sign up information is provided on this After Visit Summary.  MyChart is used to connect with patients for Virtual Visits (Telemedicine).  Patients are able to view lab/test results, encounter notes, upcoming appointments, etc.  Non-urgent messages can be sent to your provider as well.   To learn more about what you can do with MyChart, go to [email or web address].    Your next appointment:   3 month(s)  Provider:   Elder Negus, MD     Other Instructions Diet & Lifestyle recommendations:  Physical activity recommendation (The Physical Activity Guidelines for Americans. JAMA 2018;Nov 12) At least 150-300 minutes a week of moderate-intensity, or 75-150 minutes a week of vigorous-intensity aerobic physical activity, or an equivalent combination of moderate- and vigorous-intensity aerobic activity. Adults should perform muscle-strengthening activities on 2 or more days a week. Older adults should do multicomponent physical activity that includes balance training as well as aerobic and muscle-strengthening activities. Benefits of increased physical activity include lower risk of mortality including cardiovascular mortality, lower risk of cardiovascular events and associated risk factors (hypertension and diabetes), and lower risk of many cancers (including bladder, breast, colon, endometrium, esophagus, kidney, lung, and stomach). Additional improvments have been seen in cognition, risk of dementia, anxiety and depression, improved bone health, lower risk of falls, and associated injuries.  Dietary recommendation The 2019 ACC/AHA guidelines promote nutrition  as a  main fixture of cardiovascular wellness, with a recommendation for a varied diet of fruit, vegetables, fish, legumes, and whole grains (Class I), as well as recommendations to reduce sodium, cholesterol, processed meats, and refined sugars (Class IIa recommendation).10 Sodium intake, a topic of some controversy as of late, is recommended to be kept at 1,500 mg/day or less, far below the average daily intake in the [place] of 3,409 mg/day, and notably below that of previous [place] recommendations for 300mg /day.10,11 For those unable to reach 1,500 mg/day, they recommend at least a reduction of 1000 mg/day.  A Pesco-Mediterranean Diet With Intermittent Fasting: JACC Review Topic of the Week. J Am Coll Cardiol 2020;76:1484-1493 Pes[place]-Mediterranean diet, it is supplemented with extra-virgin olive oil (EVOO), which is the principle fat source, along with moderate amounts of dairy (particularly yogurt and cheese) and eggs, as well as modest amounts of alcohol consumption (ideally red wine with the evening meal), but few red and processed meats.

## 2022-12-13 ENCOUNTER — Telehealth (HOSPITAL_COMMUNITY): Payer: Self-pay | Admitting: Cardiology

## 2022-12-13 NOTE — Telephone Encounter (Signed)
Patient called and cancelled Myoview and does not wish to reschedule. She states she does not want to be exposed to anymore radiation since she has been in the past with her cancer treatment. Order will be removed from the echo/nuc WQ. Thank you.

## 2022-12-13 NOTE — Telephone Encounter (Signed)
Noted ? ?Thanks ?MJP ? ?

## 2022-12-16 DIAGNOSIS — Z471 Aftercare following joint replacement surgery: Secondary | ICD-10-CM | POA: Diagnosis not present

## 2022-12-16 DIAGNOSIS — Z96651 Presence of right artificial knee joint: Secondary | ICD-10-CM | POA: Diagnosis not present

## 2022-12-22 ENCOUNTER — Encounter (HOSPITAL_COMMUNITY): Payer: Medicare HMO

## 2022-12-28 ENCOUNTER — Telehealth: Payer: Self-pay | Admitting: *Deleted

## 2022-12-28 DIAGNOSIS — I480 Paroxysmal atrial fibrillation: Secondary | ICD-10-CM | POA: Diagnosis not present

## 2022-12-28 DIAGNOSIS — I10 Essential (primary) hypertension: Secondary | ICD-10-CM | POA: Diagnosis not present

## 2022-12-28 NOTE — Telephone Encounter (Signed)
-----   Message from [facility] sent at 12/28/2022  3:19 PM EST ----- Episodes of rapid heart beat noted, but no Afib seen. If symptoms are frequent, consider adding metoprolol tartrate 25 mg bid.  Thanks MJP

## 2022-12-28 NOTE — Telephone Encounter (Signed)
Monitor  results reviewed with patient.  She reports heart rate is elevated often.  Makes her very tired and worn out. Will forward to Dr Tenny Craw to see if she would like patient to start metoprolol.  Patient uses [facility] mail order

## 2022-12-28 NOTE — Progress Notes (Signed)
Episodes of rapid heart beat noted, but no Afib seen. If symptoms are frequent, consider adding metoprolol tartrate 25 mg bid.  Thanks MJP

## 2022-12-30 NOTE — Telephone Encounter (Signed)
Yes, I would recomm trying metoprolol 25 bid     Can start daily at first then increase Follow BP and HR

## 2022-12-31 MED ORDER — METOPROLOL TARTRATE 25 MG PO TABS: 25.0000 mg | ORAL_TABLET | Freq: Two times a day (BID) | ORAL | 3 refills | Status: DC

## 2022-12-31 NOTE — Telephone Encounter (Signed)
Pt advised and agreed 

## 2023-01-06 ENCOUNTER — Other Ambulatory Visit (HOSPITAL_COMMUNITY): Payer: Medicare HMO

## 2023-01-12 ENCOUNTER — Other Ambulatory Visit: Payer: Self-pay | Admitting: Internal Medicine

## 2023-02-03 ENCOUNTER — Other Ambulatory Visit: Payer: Self-pay | Admitting: Internal Medicine

## 2023-02-07 ENCOUNTER — Other Ambulatory Visit: Payer: Self-pay | Admitting: Internal Medicine

## 2023-02-09 ENCOUNTER — Telehealth: Payer: Self-pay | Admitting: Internal Medicine

## 2023-02-09 NOTE — Telephone Encounter (Signed)
Patient dropped off document Handicap Placard, to be filled out by provider. Patient requested to send it back via Call Patient to pick up within 7-days. Document is located in providers tray at front office.Please advise at Mobile [phone] (mobile)

## 2023-02-09 NOTE — Telephone Encounter (Signed)
Placed inside office box 

## 2023-02-10 NOTE — Telephone Encounter (Signed)
Form is placed up front

## 2023-02-11 NOTE — Telephone Encounter (Signed)
Patient has picked up forms.

## 2023-02-14 ENCOUNTER — Ambulatory Visit (HOSPITAL_COMMUNITY): Payer: Medicare HMO

## 2023-02-21 ENCOUNTER — Other Ambulatory Visit: Payer: Self-pay | Admitting: Internal Medicine

## 2023-03-18 ENCOUNTER — Ambulatory Visit: Payer: Medicare HMO | Admitting: Cardiology

## 2023-05-12 ENCOUNTER — Encounter: Payer: Self-pay | Admitting: Internal Medicine

## 2023-05-17 ENCOUNTER — Other Ambulatory Visit: Payer: Self-pay | Admitting: Internal Medicine

## 2023-07-01 ENCOUNTER — Ambulatory Visit

## 2023-07-12 ENCOUNTER — Ambulatory Visit (INDEPENDENT_AMBULATORY_CARE_PROVIDER_SITE_OTHER)

## 2023-07-12 VITALS — Ht 63.0 in | Wt 184.0 lb

## 2023-07-12 DIAGNOSIS — Z1231 Encounter for screening mammogram for malignant neoplasm of breast: Secondary | ICD-10-CM | POA: Diagnosis not present

## 2023-07-12 DIAGNOSIS — Z0000 Encounter for general adult medical examination without abnormal findings: Secondary | ICD-10-CM | POA: Diagnosis not present

## 2023-07-12 DIAGNOSIS — E785 Hyperlipidemia, unspecified: Secondary | ICD-10-CM | POA: Diagnosis not present

## 2023-07-12 DIAGNOSIS — E1169 Type 2 diabetes mellitus with other specified complication: Secondary | ICD-10-CM | POA: Diagnosis not present

## 2023-07-12 NOTE — Progress Notes (Signed)
 Subjective:   [name] is a 83 y.o. who presents for a Medicare Wellness preventive visit.  As a reminder, Annual Wellness Visits don't include a physical exam, and some assessments may be limited, especially if this visit is performed virtually. We may recommend an in-person follow-up visit with your provider if needed.  Visit Complete: Virtual I connected with  [name] on 07/12/23 by a audio enabled telemedicine application and verified that I am speaking with the correct person using two identifiers.  Patient Location: Home  Provider Location: Home Office  I discussed the limitations of evaluation and management by telemedicine. The patient expressed understanding and agreed to proceed.  Vital Signs: Because this visit was a virtual/telehealth visit, some criteria may be missing or patient reported. Any vitals not documented were not able to be obtained and vitals that have been documented are patient reported.  VideoDeclined- This patient declined Librarian, academic. Therefore the visit was completed with audio only.  Persons Participating in Visit: Patient.  AWV Questionnaire: Yes: Patient Medicare AWV questionnaire was completed by the patient on 07/12/2023; I have confirmed that all information answered by patient is correct and no changes since this date.  Cardiac Risk Factors include: advanced age (>34men, >8 women);diabetes mellitus;dyslipidemia;hypertension;Other (see comment);obesity (BMI >30kg/m2), Risk factor comments: SA, A-fib, CKD 3b     Objective:     Today's Vitals   07/12/23 1337  Weight: 184 lb (83.5 kg)  Height: 5\' 3"  (1.6 m)   Body mass index is 32.59 kg/m.     07/12/2023    1:52 PM 11/02/2022    9:56 AM 10/21/2022   11:38 AM 07/13/2022    1:33 PM 07/10/2021   10:56 AM 04/19/2020    2:00 PM 10/18/2019    1:13 PM  Advanced Directives  Does Patient Have a Medical Advance Directive? No No No No Yes No No  Type of  Agricultural consultant;Living will    Copy of Healthcare Power of Attorney in Chart?     No - copy requested    Would patient like information on creating a medical advance directive?  No - Patient declined  No - Patient declined  No - Patient declined No - Patient declined    Current Medications (verified) Outpatient Encounter Medications as of 07/12/2023  Medication Sig   albuterol  (VENTOLIN  HFA) 108 (90 Base) MCG/ACT inhaler Inhale 2 puffs into the lungs every 6 (six) hours as needed for wheezing or shortness of breath.   allopurinol (ZYLOPRIM) 100 MG tablet Take 100 mg by mouth daily.   Colchicine  (MITIGARE ) 0.6 MG CAPS Take 1 capsule (0.6 mg total) by mouth daily as needed (gout flare).   diltiazem  (CARDIZEM  CD) 120 MG 24 hr capsule TAKE 1 CAPSULE DAILY (WITH DILTIAZEM  240MG  TO TOTAL 360MG /DAY) MAY TAKE 1 ADDITIONAL CAP AS NEEDED FOR BREAKTHROUGH AFIB   diltiazem  (CARDIZEM  CD) 240 MG 24 hr capsule TAKE 1 CAPSULE EVERY DAY IN ADDITION TO DILTIAZEM  120MG  FOR TOTAL OF 360MG  DAILY   losartan  (COZAAR ) 100 MG tablet Take 1 tablet (100 mg total) by mouth daily.   ondansetron  (ZOFRAN ) 4 MG tablet TAKE 1 TABLET BY MOUTH EVERY 8 HOURS AS NEEDED FOR NAUSEA AND VOMITING   sodium chloride  (OCEAN) 0.65 % SOLN nasal spray Place 1 spray into both nostrils as needed for congestion.   amoxicillin  (AMOXIL ) 500 MG capsule Take 500 mg by mouth 2 (two) times daily. (Patient not  taking: Reported on 12/09/2022)   Blood Glucose Monitoring Suppl (TRUE METRIX METER) w/Device KIT 1 each by Does not apply route in the morning and at bedtime. (Patient not taking: Reported on 07/12/2023)   docusate sodium  (COLACE) 100 MG capsule Take 100 mg by mouth daily as needed for mild constipation. (Patient not taking: Reported on 07/12/2023)   glucose blood (TRUE METRIX BLOOD GLUCOSE TEST) test strip TEST BLOOD SUGAR AS DIRECTED (ANNUAL APPT DUE IN MAY, SEE MD FOR FUTURE REFILLS) (Patient not taking:  Reported on 07/12/2023)   hydrocortisone cream 1 % Apply 1 application  topically See admin instructions. 1 application under breast after showers and 1 application as needed for itching (Patient not taking: Reported on 07/12/2023)   Lancets (ACCU-CHEK SOFT TOUCH) lancets Use to check blood sugars twice day (Patient not taking: Reported on 07/12/2023)   methocarbamol  (ROBAXIN ) 500 MG tablet Take 1 tablet (500 mg total) by mouth every 6 (six) hours as needed for muscle spasms. (Patient not taking: Reported on 07/12/2023)   metoprolol  tartrate (LOPRESSOR ) 25 MG tablet Take 1 tablet (25 mg total) by mouth 2 (two) times daily. (Patient not taking: Reported on 07/12/2023)   oxyCODONE  (OXY IR/ROXICODONE ) 5 MG immediate release tablet Take 1 tablet (5 mg total) by mouth every 4 (four) hours as needed for severe pain. (Patient not taking: Reported on 07/12/2023)   polyethylene glycol (MIRALAX  / GLYCOLAX ) 17 g packet Take 17 g by mouth 2 (two) times daily.   pravastatin  (PRAVACHOL ) 20 MG tablet Take 1 tablet (20 mg total) by mouth daily. (Patient not taking: Reported on 12/09/2022)   rivaroxaban  (XARELTO ) 10 MG TABS tablet Take 1 tablet (10 mg total) by mouth daily with breakfast for 14 days. Then take aspirin  81 mg twice daily for a month.   senna (SENOKOT) 8.6 MG TABS tablet Take 2 tablets (17.2 mg total) by mouth at bedtime. (Patient not taking: Reported on 07/12/2023)   triamcinolone  ointment (KENALOG ) 0.5 % Apply 1 application. topically 2 (two) times daily. (Patient not taking: Reported on 10/12/2022)   No facility-administered encounter medications on file as of 07/12/2023.    Allergies (verified) Benazepril , Lopressor  [metoprolol ], Adhesive [tape], Morphine , Nifedipine, and Piroxicam   History: Past Medical History:  Diagnosis Date   Allergic rhinitis    Allergy    Anxiety    and panic attacks;pt states that she is claustrophobic   Asthma    Atrial fibrillation (HCC)    Used to take  Coumadin -instructed to stop   Cataract    bilateral surgery to remove   Chronic back pain    hx buldging disc   Colon cancer (HCC) 2007   s/p surgery and chemo   Constipation    takes Colace prn   Diabetes mellitus    glimepiride    DJD (degenerative joint disease)    Dyspnea    Dysrhythmia    Fibromyalgia    but doesn't take any meds   Gastric ulcer    GERD (gastroesophageal reflux disease)    uses Tums prn   Gout    takes Colchicine  prn   Heart murmur    History of blood clots    right lung in early 90's   History of colon polyps    History of migraine headaches    last migraine 80yrs ago;Pt states she does get sinus headaches   History of PSVT (paroxysmal supraventricular tachycardia) no current problems   History of shingles    HLD (hyperlipidemia)    diet  control - no meds   Hypertension    takes Micardis  and Diltiazem  daily   Osteoarthritis    Peptic ulcer disease    Peripheral edema    takes Furosemide  every other day   Sleep apnea    doesn't use CPAP   Past Surgical History:  Procedure Laterality Date   ANKLE SURGERY     tumor-benign removed left ankle   ANTERIOR CERVICAL DECOMP/DISCECTOMY FUSION  01/12/2011   Procedure: ANTERIOR CERVICAL DECOMPRESSION/DISCECTOMY FUSION 2 LEVELS;  Surgeon: Awilda Bogus Pool;  Location: [facility];  Service: Neurosurgery;  Laterality: Bilateral;  Cervical five-six, six-seven anterior cervical discectomy and fusion with allograft and plating   ANTERIOR CERVICAL DECOMP/DISCECTOMY FUSION  06/11/2011   Procedure: ANTERIOR CERVICAL DECOMPRESSION/DISCECTOMY FUSION 1 LEVEL/HARDWARE REMOVAL;  Surgeon: Baruch Bosch, MD;  Location: [facility];  Service: Neurosurgery;  Laterality: N/A;  Cervical four-five anterior cervical decompression fusion with allograft and plating; Removal of Cervical five to seven plate   bilateral cataracts removed     caesarean section  1966/69/72   x 3   CARDIAC CATHETERIZATION  90's and 2005   CARPAL TUNNEL  RELEASE Left 11/2013   cataract surgery  2010   COLECTOMY  2007   colon cancer   COLONOSCOPY     DILATION AND CURETTAGE OF UTERUS  02-19-11   & POLYP REMOVAL   GANGLION CYST EXCISION  > 87yrs ago   removed from left pointer finger   HEEL SPUR SURGERY Right 1992   x 2   HYSTEROSCOPY WITH D & C  02/19/2011   Procedure: DILATATION AND CURETTAGE /HYSTEROSCOPY;  Surgeon: Lacretia Piccolo, MD;  Location: [facility];  Service: Gynecology;  Laterality: N/A;  requests one hour   JOINT REPLACEMENT Left    knee   knee arthrosocpy     bil;couple of years before knee replacement   KNEE SURGERY     left TKA   LEFT HEART CATH AND CORONARY ANGIOGRAPHY N/A 04/21/2020   Procedure: LEFT HEART CATH AND CORONARY ANGIOGRAPHY;  Surgeon: Avanell Leigh, MD;  Location: [facility];  Service: Cardiovascular;  Laterality: N/A;   LUMBAR DISC SURGERY  2008   port a cath placed  2008   port a cath removed  2008   pyelonidal cystectomy     at age 78   SHOULDER ARTHROSCOPY WITH SUBACROMIAL DECOMPRESSION AND OPEN ROTATOR C Right 03/16/2013   Procedure: RIGHT SHOULDER ARTHROSCOPY WITH SUBACROMIAL DECOMPRESSION AND OPEN ROTATOR CUFF REPAIR, OPEN DISTAL CLAVICLE  RESECTION POSSIBLE BICEP TENODESIS ;  Surgeon: Lorriane Rote, MD;  Location: [facility];  Service: Orthopedics;  Laterality: Right;   TOTAL KNEE ARTHROPLASTY Right 11/02/2022   Procedure: TOTAL KNEE ARTHROPLASTY;  Surgeon: Claiborne Crew, MD;  Location: [facility];  Service: Orthopedics;  Laterality: Right;   TRIGGER FINGER RELEASE Left    thumb, tendonitis, tumor removed   TUBAL LIGATION  1972   Family History  Problem Relation Age of Onset   Heart attack Mother    Stroke Mother    Hypertension Mother    Heart disease Father    Hypertension Sister    Kidney cancer Sister        left   Bladder Cancer Brother    Liver cancer Brother        spread to colon   Coronary artery disease Other    Anesthesia problems Neg Hx    Hypotension Neg Hx     Malignant hyperthermia Neg Hx  Pseudochol deficiency Neg Hx    Stomach cancer Neg Hx    Rectal cancer Neg Hx    Social History   Socioeconomic History   Marital status: Married    Spouse name: Currie Douse   Number of children: 3   Years of education: Not on file   Highest education level: Not on file  Occupational History   Occupation: retired  Tobacco Use   Smoking status: Former    Current packs/day: 0.00    Average packs/day: 1.5 packs/day for 30.0 years (45.0 ttl pk-yrs)    Types: Cigarettes    Start date: 02/22/1961    Quit date: 02/23/1991    Years since quitting: 32.4   Smokeless tobacco: Never  Vaping Use   Vaping status: Never Used  Substance and Sexual Activity   Alcohol use: No    Alcohol/week: 0.0 standard drinks of alcohol   Drug use: No   Sexual activity: Yes    Partners: Male    Birth control/protection: Post-menopausal  Other Topics Concern   Not on file  Social History Narrative   Regular exercise- no.   Lives with husband/2025   Social Drivers of Health   Financial Resource Strain: Low Risk  (07/12/2023)   Overall Financial Resource Strain (CARDIA)    Difficulty of Paying Living Expenses: Not hard at all  Food Insecurity: No Food Insecurity (07/12/2023)   Hunger Vital Sign    Worried About Running Out of Food in the Last Year: Never true    Ran Out of Food in the Last Year: Never true  Transportation Needs: No Transportation Needs (07/12/2023)   PRAPARE - Administrator, Civil Service (Medical): No    Lack of Transportation (Non-Medical): No  Physical Activity: Inactive (07/12/2023)   Exercise Vital Sign    Days of Exercise per Week: 0 days    Minutes of Exercise per Session: 0 min  Stress: No Stress Concern Present (07/12/2023)   [employer] of Occupational Health - Occupational Stress Questionnaire    Feeling of Stress : Only a little  Social Connections: Moderately Isolated (07/12/2023)   Social Connection and Isolation Panel  [NHANES]    Frequency of Communication with Friends and Family: More than three times a week    Frequency of Social Gatherings with Friends and Family: More than three times a week    Attends Religious Services: Never    Database administrator or Organizations: No    Attends Engineer, structural: Never    Marital Status: Married    Tobacco Counseling Counseling given: Not Answered    Clinical Intake:  Pre-visit preparation completed: Yes  Pain : No/denies pain     BMI - recorded: 32.59 Nutritional Status: BMI > 30  Obese Nutritional Risks: Nausea/ vomitting/ diarrhea Diabetes: Yes CBG done?: No Did pt. bring in CBG monitor from home?: No  Lab Results  Component Value Date   HGBA1C 6.3 (H) 10/21/2022   HGBA1C 6.5 07/02/2022   HGBA1C 6.3 03/24/2021     How often do you need to have someone help you when you read instructions, pamphlets, or other written materials from your doctor or pharmacy?: 1 - Never  Interpreter Needed?: No  Information entered by :: Haaris Metallo, RMA   Activities of Daily Living     07/12/2023    1:45 PM 11/02/2022    5:12 PM  In your present state of health, do you have any difficulty performing the following activities:  Hearing? 0  0  Vision? 0 0  Difficulty concentrating or making decisions? 0 0  Walking or climbing stairs? 0 1  Dressing or bathing? 0 0  Doing errands, shopping? 0 0  Preparing Food and eating ? N   Using the Toilet? N   In the past six months, have you accidently leaked urine? N   Do you have problems with loss of bowel control? N   Managing your Medications? N   Managing your Finances? N   Housekeeping or managing your Housekeeping? N     Patient Care Team: Adelia Homestead, MD as PCP - General (Internal Medicine) Cody Das, MD as PCP - Cardiology (Cardiology) Elmyra Haggard, MD as Consulting Physician (Cardiology) Dema Filler, MD as Consulting Physician  (Ophthalmology)  Indicate any recent Medical Services you may have received from other than [facility] providers in the past year (date may be approximate).     Assessment:    This is a routine wellness examination for [name].  Hearing/Vision screen Hearing Screening - Comments:: Denies hearing difficulties   Vision Screening - Comments:: Wears eyeglasses/ [place]   Goals Addressed             This Visit's Progress    My goal for 2024 is to lose weight by the end of the year so that I can have right knee replacement surgery.   On track      Depression Screen     07/12/2023    1:56 PM 10/05/2022   10:51 AM 07/13/2022    1:34 PM 07/02/2022    2:21 PM 07/16/2021    9:56 AM 07/10/2021   10:57 AM 07/10/2021   10:55 AM  PHQ 2/9 Scores  PHQ - 2 Score 0 0 0 0 0 0 0  PHQ- 9 Score 1 0 0 0 0      Fall Risk     07/12/2023    1:53 PM 10/05/2022   10:50 AM 07/13/2022    1:34 PM 07/02/2022    2:21 PM 07/16/2021    9:56 AM  Fall Risk   Falls in the past year? 0 0 0 0 0  Number falls in past yr: 0 0 0 0 0  Injury with Fall? 0 0 0 0 0  Risk for fall due to :   No Fall Risks No Fall Risks   Follow up Falls evaluation completed;Falls prevention discussed Falls evaluation completed Falls prevention discussed Falls evaluation completed     MEDICARE RISK AT HOME:  Medicare Risk at Home Any stairs in or around the home?: No If so, are there any without handrails?: No Home free of loose throw rugs in walkways, pet beds, electrical cords, etc?: Yes Adequate lighting in your home to reduce risk of falls?: Yes Life alert?: No Use of a cane, walker or w/c?: No Grab bars in the bathroom?: Yes Shower chair or bench in shower?: Yes Elevated toilet seat or a handicapped toilet?: Yes  TIMED UP AND GO:  Was the test performed?  No  Cognitive Function: Declined/Normal: No cognitive concerns noted by patient or family. Patient alert, oriented, able to answer questions appropriately and recall recent  events. No signs of memory loss or confusion.        07/13/2022    1:43 PM  6CIT Screen  What Year? 0 points  What month? 0 points  What time? 0 points  Count back from 20 0 points  Months in reverse 0 points  Repeat phrase 0  points  Total Score 0 points    Immunizations Immunization History  Administered Date(s) Administered   PFIZER(Purple Top)SARS-COV-2 Vaccination 11/23/2019, 12/17/2019   Td 03/26/2003   Tdap 05/22/2014    Screening Tests Health Maintenance  Topic Date Due   Pneumonia Vaccine 63+ Years old (1 of 2 - PCV) Never done   Zoster Vaccines- Shingrix (1 of 2) Never done   DEXA SCAN  Never done   OPHTHALMOLOGY EXAM  12/31/2018   COVID-19 Vaccine (3 - Pfizer risk series) 01/14/2020   Colonoscopy  09/22/2022   HEMOGLOBIN A1C  04/22/2023   Diabetic kidney evaluation - Urine ACR  07/02/2023   FOOT EXAM  07/02/2023   Medicare Annual Wellness (AWV)  07/13/2023   INFLUENZA VACCINE  09/23/2023   Diabetic kidney evaluation - eGFR measurement  11/03/2023   DTaP/Tdap/Td (3 - Td or Tdap) 05/21/2024   HPV VACCINES  Aged Out   Meningococcal B Vaccine  Aged Out    Health Maintenance  Health Maintenance Due  Topic Date Due   Pneumonia Vaccine 23+ Years old (1 of 2 - PCV) Never done   Zoster Vaccines- Shingrix (1 of 2) Never done   DEXA SCAN  Never done   OPHTHALMOLOGY EXAM  12/31/2018   COVID-19 Vaccine (3 - Pfizer risk series) 01/14/2020   Colonoscopy  09/22/2022   HEMOGLOBIN A1C  04/22/2023   Diabetic kidney evaluation - Urine ACR  07/02/2023   FOOT EXAM  07/02/2023   Medicare Annual Wellness (AWV)  07/13/2023   Health Maintenance Items Addressed: DEXA ordered, A1C, UACR (Urine Albumin:Creatinine Ratio), See Nurse Notes  Additional Screening:  Vision Screening: Recommended annual ophthalmology exams for early detection of glaucoma and other disorders of the eye.  Dental Screening: Recommended annual dental exams for proper oral hygiene  Community  Resource Referral / Chronic Care Management: CRR required this visit?  No   CCM required this visit?  No   Plan:    I have personally reviewed and noted the following in the patient's chart:   Medical and social history Use of alcohol, tobacco or illicit drugs  Current medications and supplements including opioid prescriptions. Patient is not currently taking opioid prescriptions. Functional ability and status Nutritional status Physical activity Advanced directives List of other physicians Hospitalizations, surgeries, and ER visits in previous 12 months Vitals Screenings to include cognitive, depression, and falls Referrals and appointments  In addition, I have reviewed and discussed with patient certain preventive protocols, quality metrics, and best practice recommendations. A written personalized care plan for preventive services as well as general preventive health recommendations were provided to patient.   Yojan Paskett L Madylin Fairbank, CMA   07/12/2023   After Visit Summary: (MyChart) Due to this being a telephonic visit, the after visit summary with patients personalized plan was offered to patient via MyChart   Notes: Please refer to Routing Comments.

## 2023-07-12 NOTE — Patient Instructions (Signed)
 [name] , Thank you for taking time out of your busy schedule to complete your Annual Wellness Visit with me. I enjoyed our conversation and look forward to speaking with you again next year. I, as well as your care team,  appreciate your ongoing commitment to your health goals. Please review the following plan we discussed and let me know if I can assist you in the future. Your Game plan/ To Do List    Referrals: If you haven't heard from the office you've been referred to, please reach out to them at the phone provided.  You have an order for:  [x]   Bone Density     Please call for appointment:  [facility] [address] [place], [place] [address] [phone]  Make sure to wear two-piece clothing.  No lotions, powders, or deodorants the day of the appointment. Make sure to bring picture ID and insurance card.  Bring list of medications you are currently taking including any supplements.   Follow up Visits: Next Medicare AWV with our clinical staff: 07/12/2024.   Have you seen your provider in the last 6 months (3 months if uncontrolled diabetes)? Yes Next Office Visit with your provider: 10  Clinician Recommendations:  Aim for 30 minutes of exercise or brisk walking, 6-8 glasses of water , and 5 servings of fruits and vegetables each day.       This is a list of the screening recommended for you and due dates:  Health Maintenance  Topic Date Due   Pneumonia Vaccine (1 of 2 - PCV) Never done   Zoster (Shingles) Vaccine (1 of 2) Never done   DEXA scan (bone density measurement)  Never done   Eye exam for diabetics  12/31/2018   COVID-19 Vaccine (3 - Pfizer risk series) 01/14/2020   Colon Cancer Screening  09/22/2022   Hemoglobin A1C  04/22/2023   Yearly kidney health urinalysis for diabetes  07/02/2023   Complete foot exam   07/02/2023   Medicare Annual Wellness Visit  07/13/2023   Flu Shot  09/23/2023   Yearly kidney function blood test for diabetes   11/03/2023   DTaP/Tdap/Td vaccine (3 - Td or Tdap) 05/21/2024   HPV Vaccine  Aged Out   Meningitis B Vaccine  Aged Out    Advanced directives: (Copy Requested) Please bring a copy of your health care power of attorney and living will to the office to be added to your chart at your convenience. You can mail to [facility] [address] [place], [place] [address] or email to [email or web address] Advance Care Planning is important because it:  [x]  Makes sure you receive the medical care that is consistent with your values, goals, and preferences  [x]  It provides guidance to your family and loved ones and reduces their decisional burden about whether or not they are making the right decisions based on your wishes.  Follow the link provided in your after visit summary or read over the paperwork we have mailed to you to help you started getting your Advance Directives in place. If you need assistance in completing these, please reach out to us  so that we can help you!  See attachments for Preventive Care and Fall Prevention Tips.

## 2023-07-13 NOTE — Addendum Note (Signed)
 Addended by: Yaser Harvill L on: 07/13/2023 09:29 AM   Modules accepted: Orders

## 2023-07-25 ENCOUNTER — Ambulatory Visit (HOSPITAL_BASED_OUTPATIENT_CLINIC_OR_DEPARTMENT_OTHER)

## 2023-08-03 ENCOUNTER — Other Ambulatory Visit (HOSPITAL_BASED_OUTPATIENT_CLINIC_OR_DEPARTMENT_OTHER)

## 2023-08-10 ENCOUNTER — Other Ambulatory Visit: Payer: Self-pay | Admitting: Internal Medicine

## 2023-08-10 MED ORDER — DILTIAZEM HCL ER COATED BEADS 120 MG PO CP24: ORAL_CAPSULE | ORAL | 0 refills | Status: DC

## 2023-08-10 NOTE — Telephone Encounter (Signed)
 Copied from CRM [id number]. Topic: Clinical - Medication Refill >> Aug 10, 2023  8:58 AM Dorisann Garre T wrote: Medication: diltiazem  (CARDIZEM  CD) 120 MG 24 hr capsule [[id number]]  Has the patient contacted their pharmacy? Yes (Agent: If no, request that the patient contact the pharmacy for the refill. If patient does not wish to contact the pharmacy document the reason why and proceed with request.) (Agent: If yes, when and what did the pharmacy advise?)  This is the patient's preferred pharmacy:  [facility] - [place], [place] - [address] [place] [address] Phone: [phone] Fax: [phone]  Is this the correct pharmacy for this prescription? Yes If no, delete pharmacy and type the correct one.   Has the prescription been filled recently? No  Is the patient out of the medication? Yes  Has the patient been seen for an appointment in the last year OR does the patient have an upcoming appointment? Yes  Can we respond through MyChart? Yes  Agent: Please be advised that Rx refills may take up to 3 business days. We ask that you follow-up with your pharmacy.

## 2023-08-19 ENCOUNTER — Other Ambulatory Visit: Payer: Self-pay | Admitting: Internal Medicine

## 2023-10-05 ENCOUNTER — Other Ambulatory Visit: Payer: Self-pay | Admitting: Internal Medicine

## 2023-10-11 ENCOUNTER — Other Ambulatory Visit: Payer: Self-pay

## 2023-10-11 ENCOUNTER — Emergency Department (HOSPITAL_COMMUNITY)

## 2023-10-11 ENCOUNTER — Emergency Department (HOSPITAL_COMMUNITY)
Admission: EM | Admit: 2023-10-11 | Discharge: 2023-10-11 | Disposition: A | Discharge status: Home / Self Care | Attending: Emergency Medicine | Admitting: Emergency Medicine

## 2023-10-11 ENCOUNTER — Telehealth (HOSPITAL_COMMUNITY): Payer: Self-pay

## 2023-10-11 DIAGNOSIS — Z85038 Personal history of other malignant neoplasm of large intestine: Secondary | ICD-10-CM | POA: Insufficient documentation

## 2023-10-11 DIAGNOSIS — Z7982 Long term (current) use of aspirin: Secondary | ICD-10-CM | POA: Insufficient documentation

## 2023-10-11 DIAGNOSIS — I10 Essential (primary) hypertension: Secondary | ICD-10-CM | POA: Diagnosis not present

## 2023-10-11 DIAGNOSIS — J45909 Unspecified asthma, uncomplicated: Secondary | ICD-10-CM | POA: Diagnosis not present

## 2023-10-11 DIAGNOSIS — Z79899 Other long term (current) drug therapy: Secondary | ICD-10-CM | POA: Diagnosis not present

## 2023-10-11 DIAGNOSIS — Z96653 Presence of artificial knee joint, bilateral: Secondary | ICD-10-CM | POA: Insufficient documentation

## 2023-10-11 DIAGNOSIS — R42 Dizziness and giddiness: Secondary | ICD-10-CM | POA: Diagnosis not present

## 2023-10-11 DIAGNOSIS — E119 Type 2 diabetes mellitus without complications: Secondary | ICD-10-CM | POA: Diagnosis not present

## 2023-10-11 DIAGNOSIS — I4891 Unspecified atrial fibrillation: Secondary | ICD-10-CM | POA: Diagnosis not present

## 2023-10-11 DIAGNOSIS — R079 Chest pain, unspecified: Secondary | ICD-10-CM | POA: Diagnosis not present

## 2023-10-11 DIAGNOSIS — I700 Atherosclerosis of aorta: Secondary | ICD-10-CM | POA: Diagnosis not present

## 2023-10-11 DIAGNOSIS — I499 Cardiac arrhythmia, unspecified: Secondary | ICD-10-CM | POA: Diagnosis not present

## 2023-10-11 DIAGNOSIS — G4489 Other headache syndrome: Secondary | ICD-10-CM | POA: Diagnosis not present

## 2023-10-11 DIAGNOSIS — R0602 Shortness of breath: Secondary | ICD-10-CM | POA: Diagnosis not present

## 2023-10-11 DIAGNOSIS — I771 Stricture of artery: Secondary | ICD-10-CM | POA: Diagnosis not present

## 2023-10-11 DIAGNOSIS — Z87891 Personal history of nicotine dependence: Secondary | ICD-10-CM | POA: Insufficient documentation

## 2023-10-11 LAB — CBC WITH DIFFERENTIAL/PLATELET
Abs Immature Granulocytes: 0.01 K/uL (ref 0.00–0.07)
Basophils Absolute: 0.1 K/uL (ref 0.0–0.1)
Basophils Relative: 1 %
Eosinophils Absolute: 0.3 K/uL (ref 0.0–0.5)
Eosinophils Relative: 4 %
HCT: 43.3 % (ref 36.0–46.0)
Hemoglobin: 14.6 g/dL (ref 12.0–15.0)
Immature Granulocytes: 0 %
Lymphocytes Relative: 36 %
Lymphs Abs: 2.8 K/uL (ref 0.7–4.0)
MCH: 30.2 pg (ref 26.0–34.0)
MCHC: 33.7 g/dL (ref 30.0–36.0)
MCV: 89.5 fL (ref 80.0–100.0)
Monocytes Absolute: 0.5 K/uL (ref 0.1–1.0)
Monocytes Relative: 6 %
Neutro Abs: 4.1 K/uL (ref 1.7–7.7)
Neutrophils Relative %: 53 %
Platelets: 236 K/uL (ref 150–400)
RBC: 4.84 MIL/uL (ref 3.87–5.11)
RDW: 13.5 % (ref 11.5–15.5)
WBC: 7.7 K/uL (ref 4.0–10.5)
nRBC: 0 % (ref 0.0–0.2)

## 2023-10-11 LAB — COMPREHENSIVE METABOLIC PANEL WITH GFR
ALT: 12 U/L (ref 0–44)
AST: 19 U/L (ref 15–41)
Albumin: 3.7 g/dL (ref 3.5–5.0)
Alkaline Phosphatase: 89 U/L (ref 38–126)
Anion gap: 12 (ref 5–15)
BUN: 20 mg/dL (ref 8–23)
CO2: 21 mmol/L — ABNORMAL LOW (ref 22–32)
Calcium: 9.2 mg/dL (ref 8.9–10.3)
Chloride: 106 mmol/L (ref 98–111)
Creatinine, Ser: 1.08 mg/dL — ABNORMAL HIGH (ref 0.44–1.00)
GFR, Estimated: 52 mL/min — ABNORMAL LOW (ref >60)
Glucose, Bld: 134 mg/dL — ABNORMAL HIGH (ref 70–99)
Potassium: 3.5 mmol/L (ref 3.5–5.1)
Sodium: 139 mmol/L (ref 135–145)
Total Bilirubin: 0.8 mg/dL (ref 0.0–1.2)
Total Protein: 6.4 g/dL — ABNORMAL LOW (ref 6.5–8.1)

## 2023-10-11 LAB — TROPONIN I (HIGH SENSITIVITY)
Troponin I (High Sensitivity): 12 ng/L (ref <18)
Troponin I (High Sensitivity): 18 ng/L — ABNORMAL HIGH (ref <18)

## 2023-10-11 LAB — TSH: TSH: 2.218 u[IU]/mL (ref 0.350–4.500)

## 2023-10-11 LAB — T4, FREE: Free T4: 1.07 ng/dL (ref 0.61–1.12)

## 2023-10-11 LAB — MAGNESIUM: Magnesium: 1.9 mg/dL (ref 1.7–2.4)

## 2023-10-11 MED ORDER — SODIUM CHLORIDE 0.9 % IV BOLUS
1000.0000 mL | Freq: Once | INTRAVENOUS | Status: AC
  Administered 2023-10-11: 1000 mL via INTRAVENOUS

## 2023-10-11 MED ORDER — DILTIAZEM HCL 25 MG/5ML IV SOLN
20.0000 mg | Freq: Once | INTRAVENOUS | Status: AC
  Administered 2023-10-11: 20 mg via INTRAVENOUS
  Filled 2023-10-11: qty 5

## 2023-10-11 NOTE — Discharge Instructions (Addendum)
 It was a pleasure caring for you today in the emergency department.  Please follow-up with your cardiologist.  It is recommended that you start anticoagulation but this seems to have given you side effects in the past that are undesirable.  Recommend that you discuss further with your cardiologist.  In the meantime you can take baby aspirin  daily in the morning with food.    Return to the Emergency Department if you have unusual chest pain, pressure, or discomfort, shortness of breath, nausea, vomiting, burping, heartburn, tingling upper body parts, sweating, cold, clammy skin, or racing heartbeat. Call 911 if you think you are having a heart attack. Take all cardiac medications as prescribed - notify your doctor if you have any side effects. Follow cardiac diet - avoid fatty & fried foods, don't eat too much red meat, eat lots of fruits & vegetables, and dairy products should be low fat.  Avoid stimulants such as excessive caffeine or energy drinks.  Be sure to get plenty of rest. Please lose weight if you are overweight. Become more active with walking, gardening, or any other activity that gets you to moving.    Please return to the emergency department immediately for any new or concerning symptoms, or if you get worse.

## 2023-10-11 NOTE — Telephone Encounter (Signed)
Reached out to patient to schedule ED f/u appointment. No answer left voicemail.

## 2023-10-11 NOTE — ED Provider Notes (Signed)
 [place] EMERGENCY DEPARTMENT AT [facility] Provider Note  CSN: [id number] Arrival date & time: 10/11/23 0848  Chief Complaint(s) Atrial Fibrillation  HPI [name] is a 83 y.o. female with past medical history as below, significant for atrial fibrillation not on anticoagulation, fibromyalgia, hypertension, hyperlipidemia, type II DM, obesity who presents to the ED with complaint of chest pain, atrial fibrillation  Patient reports that she has atrial fibrillation, she follows with Dr. Okey.  She is not on anticoagulation because patient reports that she cannot function while she is on blood thinners and she has to care for her husband who has dementia.  Patient reports that around 5 AM she got up to use the restroom, when she got back to her bed she fell like she was in A-fib.  She attempted to take some deep breaths and rest which did not alleviate the symptoms, she took her regular medications including diltiazem  and aspirin  which did improve her blood pressure but she was still in A-fib.  On EMS arrival patient A-fib with RVR, she was given diltiazem  10 mg x 2 with mild improvement to her symptoms.  Patient ports she is feeling better upon arrival to the ER, no longer having any chest discomfort or dyspnea.  Feels her palpitations have improved from earlier.  Past Medical History Past Medical History:  Diagnosis Date   Allergic rhinitis    Allergy    Anxiety    and panic attacks;pt states that she is claustrophobic   Asthma    Atrial fibrillation (HCC)    Used to take Coumadin -instructed to stop   Cataract    bilateral surgery to remove   Chronic back pain    hx buldging disc   Colon cancer (HCC) 2007   s/p surgery and chemo   Constipation    takes Colace prn   Diabetes mellitus    glimepiride    DJD (degenerative joint disease)    Dyspnea    Dysrhythmia    Fibromyalgia    but doesn't take any meds   Gastric ulcer    GERD (gastroesophageal reflux disease)     uses Tums prn   Gout    takes Colchicine  prn   Heart murmur    History of blood clots    right lung in early 90's   History of colon polyps    History of migraine headaches    last migraine 98yrs ago;Pt states she does get sinus headaches   History of PSVT (paroxysmal supraventricular tachycardia) no current problems   History of shingles    HLD (hyperlipidemia)    diet control - no meds   Hypertension    takes Micardis  and Diltiazem  daily   Osteoarthritis    Peptic ulcer disease    Peripheral edema    takes Furosemide  every other day   Sleep apnea    doesn't use CPAP   Patient Active Problem List   Diagnosis Date Noted   S/P total knee arthroplasty, right 11/02/2022   Ventral hernia 10/07/2020   Demand ischemia (HCC)    A-fib (HCC) 04/20/2020   Stage 3b chronic kidney disease (HCC) 04/19/2020   Gouty arthritis of left foot 03/05/2020   Right hip pain 02/04/2020   Right foot pain 02/04/2020   Left hip pain 08/05/2017   Morbid obesity (HCC) 06/02/2016   Renal cyst 01/12/2016   Chronic back pain 05/23/2015   PAF (paroxysmal atrial fibrillation) (HCC) 05/22/2015   Degeneration of intervertebral disc of lumbosacral region 04/11/2015  Fibromyalgia 04/11/2015   Routine general medical examination at a health care facility 03/15/2013   Pre-op evaluation 02/10/2011   Hyperlipidemia associated with type 2 diabetes mellitus (HCC) 02/10/2011   SLEEP APNEA, OBSTRUCTIVE 04/24/2010   Type 2 diabetes mellitus with hyperlipidemia (HCC) 12/25/2008   History of malignant neoplasm of large intestine 12/25/2008   Essential hypertension 01/03/2007   Asthma 01/03/2007   Home Medication(s) Prior to Admission medications   Medication Sig Start Date End Date Taking? Authorizing Provider  albuterol  (VENTOLIN  HFA) 108 (90 Base) MCG/ACT inhaler Inhale 2 puffs into the lungs every 6 (six) hours as needed for wheezing or shortness of breath. 07/05/22  Yes Rollene Almarie LABOR, MD  aspirin  EC  81 MG tablet Take 81-325 mg by mouth daily as needed for moderate pain (pain score 4-6).   Yes [provider]  Colchicine  (MITIGARE ) 0.6 MG CAPS Take 1 capsule (0.6 mg total) by mouth daily as needed (gout flare). 07/05/22  Yes Rollene Almarie LABOR, MD  diltiazem  (CARDIZEM  CD) 120 MG 24 hr capsule TAKE 1 CAP DAILY W/DILTIAZEM  240MG  TO EQUAL 360MG /DAY. MAY TAKE 1 EXTRA CAP AS NEEDED FOR BREAK THRU AFIB *MAKE APPT* 10/06/23  Yes Patwardhan, Manish J, MD  diltiazem  (CARDIZEM  CD) 240 MG 24 hr capsule TAKE 1 CAPSULE EVERY DAY IN ADDITION TO DILTIAZEM  120MG  FOR TOTAL OF 360MG  DAILY 01/13/23  Yes Okey [name], MD  docusate sodium  (COLACE) 100 MG capsule Take 100 mg by mouth 2 (two) times daily as needed for mild constipation.   Yes [provider]  losartan  (COZAAR ) 100 MG tablet Take 1 tablet (100 mg total) by mouth daily. 01/13/23  Yes Okey [name], MD  ondansetron  (ZOFRAN ) 4 MG tablet TAKE 1 TABLET BY MOUTH EVERY 8 HOURS AS NEEDED FOR NAUSEA AND VOMITING 05/18/23  Yes Rollene Almarie LABOR, MD  sodium chloride  (OCEAN) 0.65 % SOLN nasal spray Place 1 spray into both nostrils as needed for congestion.   Yes [provider]                                                                                                                                    Past Surgical History Past Surgical History:  Procedure Laterality Date   ANKLE SURGERY     tumor-benign removed left ankle   ANTERIOR CERVICAL DECOMP/DISCECTOMY FUSION  01/12/2011   Procedure: ANTERIOR CERVICAL DECOMPRESSION/DISCECTOMY FUSION 2 LEVELS;  Surgeon: Victory LABOR Pool;  Location: [facility];  Service: Neurosurgery;  Laterality: Bilateral;  Cervical five-six, six-seven anterior cervical discectomy and fusion with allograft and plating   ANTERIOR CERVICAL DECOMP/DISCECTOMY FUSION  06/11/2011   Procedure: ANTERIOR CERVICAL DECOMPRESSION/DISCECTOMY FUSION 1 LEVEL/HARDWARE REMOVAL;  Surgeon: Victory LABOR Gunnels, MD;  Location: [facility]  NEURO ORS;  Service: Neurosurgery;  Laterality: N/A;  Cervical four-five anterior cervical decompression fusion with allograft and plating; Removal of Cervical five to seven plate   bilateral cataracts removed  caesarean section  1966/69/72   x 3   CARDIAC CATHETERIZATION  90's and 2005   CARPAL TUNNEL RELEASE Left 11/2013   cataract surgery  2010   COLECTOMY  2007   colon cancer   COLONOSCOPY     DILATION AND CURETTAGE OF UTERUS  02-19-11   & POLYP REMOVAL   GANGLION CYST EXCISION  > 65yrs ago   removed from left pointer finger   HEEL SPUR SURGERY Right 1992   x 2   HYSTEROSCOPY WITH D & C  02/19/2011   Procedure: DILATATION AND CURETTAGE /HYSTEROSCOPY;  Surgeon: Evalene SHAUNNA Organ, MD;  Location: [facility] ORS;  Service: Gynecology;  Laterality: N/A;  requests one hour   JOINT REPLACEMENT Left    knee   knee arthrosocpy     bil;couple of years before knee replacement   KNEE SURGERY     left TKA   LEFT HEART CATH AND CORONARY ANGIOGRAPHY N/A 04/21/2020   Procedure: LEFT HEART CATH AND CORONARY ANGIOGRAPHY;  Surgeon: Court Dorn PARAS, MD;  Location: [facility] INVAS[facility];  Service: Cardiovascular;  Laterality: N/A;   LUMBAR DISC SURGERY  2008   port a cath placed  2008   port a cath removed  2008   pyelonidal cystectomy     at age 18   SHOULDER ARTHROSCOPY WITH SUBACROMIAL DECOMPRESSION AND OPEN ROTATOR C Right 03/16/2013   Procedure: RIGHT SHOULDER ARTHROSCOPY WITH SUBACROMIAL DECOMPRESSION AND OPEN ROTATOR CUFF REPAIR, OPEN DISTAL CLAVICLE  RESECTION POSSIBLE BICEP TENODESIS ;  Surgeon: Elspeth JONELLE Her, MD;  Location: [facility] OR;  Service: Orthopedics;  Laterality: Right;   TOTAL KNEE ARTHROPLASTY Right 11/02/2022   Procedure: TOTAL KNEE ARTHROPLASTY;  Surgeon: Ernie Cough, MD;  Location: [facility] ORS;  Service: Orthopedics;  Laterality: Right;   TRIGGER FINGER RELEASE Left    thumb, tendonitis, tumor removed   TUBAL LIGATION  1972   Family History Family History  Problem Relation Age of  Onset   Heart attack Mother    Stroke Mother    Hypertension Mother    Heart disease Father    Hypertension Sister    Kidney cancer Sister        left   Bladder Cancer Brother    Liver cancer Brother        spread to colon   Coronary artery disease Other    Anesthesia problems Neg Hx    Hypotension Neg Hx    Malignant hyperthermia Neg Hx    Pseudochol deficiency Neg Hx    Stomach cancer Neg Hx    Rectal cancer Neg Hx     Social History Social History   Tobacco Use   Smoking status: Former    Current packs/day: 0.00    Average packs/day: 1.5 packs/day for 30.0 years (45.0 ttl pk-yrs)    Types: Cigarettes    Start date: 02/22/1961    Quit date: 02/23/1991    Years since quitting: 32.6   Smokeless tobacco: Never  Vaping Use   Vaping status: Never Used  Substance Use Topics   Alcohol use: No    Alcohol/week: 0.0 standard drinks of alcohol   Drug use: No   Allergies Benazepril , Lopressor  [metoprolol ], Adhesive [tape], Morphine , Nifedipine, and Piroxicam  Review of Systems A thorough review of systems was obtained and all systems are negative except as noted in the HPI and PMH.   Physical Exam Vital Signs  I have reviewed the triage vital signs BP 118/69   Pulse 75   Temp  97.9 F (36.6 C) (Oral)   Resp 15   LMP 12/30/2000   SpO2 100%  Physical Exam Vitals and nursing note reviewed.  Constitutional:      General: She is not in acute distress.    Appearance: Normal appearance.  HENT:     Head: Normocephalic and atraumatic.     Right Ear: External ear normal.     Left Ear: External ear normal.     Nose: Nose normal.     Mouth/Throat:     Mouth: Mucous membranes are moist.  Eyes:     General: No scleral icterus.       Right eye: No discharge.        Left eye: No discharge.  Cardiovascular:     Rate and Rhythm: Tachycardia present. Rhythm irregular.     Pulses: Normal pulses.     Heart sounds: Normal heart sounds.  Pulmonary:     Effort: Pulmonary effort  is normal. No respiratory distress.     Breath sounds: Normal breath sounds. No stridor.  Abdominal:     General: Abdomen is flat. There is no distension.     Palpations: Abdomen is soft.     Tenderness: There is no abdominal tenderness.  Musculoskeletal:     Cervical back: No rigidity.     Right lower leg: No edema.     Left lower leg: No edema.  Skin:    General: Skin is warm and dry.     Capillary Refill: Capillary refill takes less than 2 seconds.  Neurological:     Mental Status: She is alert.  Psychiatric:        Mood and Affect: Mood normal.        Behavior: Behavior normal. Behavior is cooperative.     ED Results and Treatments Labs (all labs ordered are listed, but only abnormal results are displayed) Labs Reviewed  COMPREHENSIVE METABOLIC PANEL WITH GFR - Abnormal; Notable for the following components:      Result Value   CO2 21 (*)    Glucose, Bld 134 (*)    Creatinine, Ser 1.08 (*)    Total Protein 6.4 (*)    GFR, Estimated 52 (*)    All other components within normal limits  TROPONIN I (HIGH SENSITIVITY) - Abnormal; Notable for the following components:   Troponin I (High Sensitivity) 18 (*)    All other components within normal limits  CBC WITH DIFFERENTIAL/PLATELET  TSH  T4, FREE  MAGNESIUM  TROPONIN I (HIGH SENSITIVITY)                                                                                                                          Radiology DG Chest Portable 1 View Result Date: 10/11/2023 CLINICAL DATA:  cp EXAM: PORTABLE CHEST - 1 VIEW COMPARISON:  April 19, 2020 FINDINGS: No focal airspace consolidation, pleural effusion, or pneumothorax. No cardiomegaly. Tortuous aorta with aortic atherosclerosis. No acute fracture or destructive lesions. Multilevel thoracic  osteophytosis. IMPRESSION: No acute cardiopulmonary abnormality. Electronically Signed   By: Rogelia Myers M.D.   On: 10/11/2023 09:56    Pertinent labs & imaging results that were  available during my care of the patient were reviewed by me and considered in my medical decision making (see MDM for details).  Medications Ordered in ED Medications  sodium chloride  0.9 % bolus 1,000 mL (0 mLs Intravenous Stopped 10/11/23 1031)  diltiazem  (CARDIZEM ) injection 20 mg (20 mg Intravenous Given 10/11/23 0932)                                                                                                                                     Procedures .Critical Care  Performed by: Elnor Jayson LABOR, DO Authorized by: Elnor Jayson LABOR, DO   Critical care provider statement:    Critical care time (minutes):  30   Critical care time was exclusive of:  Separately billable procedures and treating other patients   Critical care was necessary to treat or prevent imminent or life-threatening deterioration of the following conditions:  Cardiac failure   Critical care was time spent personally by me on the following activities:  Development of treatment plan with patient or surrogate, discussions with consultants, evaluation of patient's response to treatment, examination of patient, ordering and review of laboratory studies, ordering and review of radiographic studies, ordering and performing treatments and interventions, pulse oximetry, re-evaluation of patient's condition, review of old charts and obtaining history from patient or surrogate   (including critical care time)  Medical Decision Making / ED Course    Medical Decision Making:    [name] is a 83 y.o. female with past medical history as below, significant for atrial fibrillation not on anticoagulation, fibromyalgia, hypertension, hyperlipidemia, type II DM, obesity who presents to the ED with complaint of chest pain, atrial fibrillation. The complaint involves an extensive differential diagnosis and also carries with it a high risk of complications and morbidity.  Serious etiology was considered. Ddx includes but is not  limited to: Differential includes all life-threatening causes for chest pain. This includes but is not exclusive to acute coronary syndrome, aortic dissection, pulmonary embolism, cardiac tamponade, community-acquired pneumonia, pericarditis, musculoskeletal chest wall pain, etc.   Complete initial physical exam performed, notably the patient was in no acute distress, heart rate is elevated.    Reviewed and confirmed nursing documentation for past medical history, family history, social history.  Vital signs reviewed.    Atrial fibrillation with RVR> - A-fib with RVR, onset around 5 AM this morning although patient does have history of A-fib she is not anticoagulated - chadsvasc 5, patient reports that she was instructed to start anticoagulation but she was having side effects of medication so she is no longer on anticoagulation - Patient was given diltiazem  in the ER and she is not rate controlled.  She is feeling much better, no longer having palpitations, feeling  back to baseline - Labs reviewed, she has some slight Trope leak but no chest pain, likely demand ischemia in setting of A-fib with RVR.  Electrolytes are stable, thyroid  function testing is stable. - Symptoms today likely secondary to A-fib with RVR, encourage patient follow-up with cardiology, given that she is rate controlled it is reasonable to restart her home medications.  She has no chest pain or palpitations on repeat assessment.  Encouraged to continue home medications.  She is amenable to starting baby aspirin  but still is not interested in anticoagulation    12:50 PM:  I have discussed the diagnosis/risks/treatment options with the patient.  Evaluation and diagnostic testing in the emergency department does not suggest an emergent condition requiring admission or immediate intervention beyond what has been performed at this time.  They will follow up with cardiology, PCP. We also discussed returning to the ED immediately if new  or worsening sx occur. We discussed the sx which are most concerning (e.g., sudden worsening pain, fever, inability to tolerate by mouth, palpitations, chest pain, syncope, etc.) that necessitate immediate return.    The patient appears reasonably screen and/or stabilized for discharge and I doubt any other medical condition or other [facility] requiring further screening, evaluation, or treatment in the ED at this time prior to discharge.                        Additional history obtained: -Additional history obtained from ems -External records from outside source obtained and reviewed including: Chart review including previous notes, labs, imaging, consultation notes including  Home medications, primary care recommendation, cardiology documentation > follows with Dr. Okey   Lab Tests: -I ordered, reviewed, and interpreted labs.   The pertinent results include:   Labs Reviewed  COMPREHENSIVE METABOLIC PANEL WITH GFR - Abnormal; Notable for the following components:      Result Value   CO2 21 (*)    Glucose, Bld 134 (*)    Creatinine, Ser 1.08 (*)    Total Protein 6.4 (*)    GFR, Estimated 52 (*)    All other components within normal limits  TROPONIN I (HIGH SENSITIVITY) - Abnormal; Notable for the following components:   Troponin I (High Sensitivity) 18 (*)    All other components within normal limits  CBC WITH DIFFERENTIAL/PLATELET  TSH  T4, FREE  MAGNESIUM  TROPONIN I (HIGH SENSITIVITY)    Notable for as above, stable  EKG   EKG Interpretation Date/Time:    Ventricular Rate:    PR Interval:    QRS Duration:    QT Interval:    QTC Calculation:   R Axis:      Text Interpretation:           Imaging Studies ordered: I ordered imaging studies including chest x-ray I independently visualized the following imaging with scope of interpretation limited to determining acute life threatening conditions related to emergency care; findings noted above I agree  with the radiologist interpretation If any imaging was obtained with contrast I closely monitored patient for any possible adverse reaction a/w contrast administration in the emergency department   Medicines ordered and prescription drug management: Meds ordered this encounter  Medications   sodium chloride  0.9 % bolus 1,000 mL   diltiazem  (CARDIZEM ) injection 20 mg    -I have reviewed the patients home medicines and have made adjustments as needed   Consultations Obtained: Not applicable  Cardiac Monitoring: The patient was maintained on a  cardiac monitor.  I personally viewed and interpreted the cardiac monitored which showed an underlying rhythm of: A-fib with RVR Continuous pulse oximetry interpreted by myself, 100% on RA.    Social Determinants of Health:  Diagnosis or treatment significantly limited by social determinants of health: former smoker and obesity   Reevaluation: After the interventions noted above, I reevaluated the patient and found that they have improved  Co morbidities that complicate the patient evaluation  Past Medical History:  Diagnosis Date   Allergic rhinitis    Allergy    Anxiety    and panic attacks;pt states that she is claustrophobic   Asthma    Atrial fibrillation (HCC)    Used to take Coumadin -instructed to stop   Cataract    bilateral surgery to remove   Chronic back pain    hx buldging disc   Colon cancer (HCC) 2007   s/p surgery and chemo   Constipation    takes Colace prn   Diabetes mellitus    glimepiride    DJD (degenerative joint disease)    Dyspnea    Dysrhythmia    Fibromyalgia    but doesn't take any meds   Gastric ulcer    GERD (gastroesophageal reflux disease)    uses Tums prn   Gout    takes Colchicine  prn   Heart murmur    History of blood clots    right lung in early 90's   History of colon polyps    History of migraine headaches    last migraine 71yrs ago;Pt states she does get sinus headaches   History  of PSVT (paroxysmal supraventricular tachycardia) no current problems   History of shingles    HLD (hyperlipidemia)    diet control - no meds   Hypertension    takes Micardis  and Diltiazem  daily   Osteoarthritis    Peptic ulcer disease    Peripheral edema    takes Furosemide  every other day   Sleep apnea    doesn't use CPAP      Dispostion: Disposition decision including need for hospitalization was considered, and patient discharged from emergency department.    Final Clinical Impression(s) / ED Diagnoses Final diagnoses:  Atrial fibrillation with RVR (HCC)        Elnor Jayson LABOR, DO 10/11/23 1251

## 2023-10-11 NOTE — ED Triage Notes (Signed)
 Patient with hx of afib woke up at 0500 to go to the bathroom and did not feel right. Tried to go back to sleep but couldn't. Took her BP and it was high, so called EMS. In afib RVR 130-170. 10mg  Cardizem  with temporary relief so 10mg  more was given. Complains of chest pain, sob.

## 2023-10-12 ENCOUNTER — Other Ambulatory Visit: Payer: Self-pay | Admitting: Internal Medicine

## 2023-10-12 NOTE — Telephone Encounter (Signed)
 Copied from CRM #[id number]. Topic: Clinical - Medication Refill >> Oct 12, 2023 10:36 AM [place] J wrote: Medication: ondansetron  (ZOFRAN ) 4 MG tablet  Has the patient contacted their pharmacy? No (Agent: If no, request that the patient contact the pharmacy for the refill. If patient does not wish to contact the pharmacy document the reason why and proceed with request.) (Agent: If yes, when and what did the pharmacy advise?) Bottle says that there is not refills available. This is the patient's preferred pharmacy:  [id number] [place], [place] - [address] [place] [address] Phone: [phone] Fax: [phone]  Is this the correct pharmacy for this prescription? Yes If no, delete pharmacy and type the correct one.   Has the prescription been filled recently? No  Is the patient out of the medication? No  Has the patient been seen for an appointment in the last year OR does the patient have an upcoming appointment? Yes  Can we respond through MyChart? No  Agent: Please be advised that Rx refills may take up to 3 business days. We ask that you follow-up with your pharmacy.

## 2023-10-12 NOTE — Telephone Encounter (Signed)
 Do I need to send to Dr Dr Elmira

## 2023-10-12 NOTE — Telephone Encounter (Unsigned)
 Copied from CRM #[id number]. Topic: Clinical - Medication Refill >> Oct 12, 2023 10:30 AM [place] J wrote: Medication:  diltiazem  (CARDIZEM  CD) 120 MG 24 hr capsule diltiazem  (CARDIZEM  CD) 240 MG 24 hr capsule TRUE Metrix Blood Glucose Test Strips  Has the patient contacted their pharmacy? Yes (Agent: If no, request that the patient contact the pharmacy for the refill. If patient does not wish to contact the pharmacy document the reason why and proceed with request.) (Agent: If yes, when and what did the pharmacy advise?) Pharmacy instructed patient to call primary care.  This is the patient's preferred pharmacy:  [facility] - [place], [place] - [address] [place] [address] Phone: [phone] Fax: [phone]  Is this the correct pharmacy for this prescription? Yes If no, delete pharmacy and type the correct one.   Has the prescription been filled recently? No  Is the patient out of the medication? No  Has the patient been seen for an appointment in the last year OR does the patient have an upcoming appointment? Yes  Can we respond through MyChart? Yes  Agent: Please be advised that Rx refills may take up to 3 business days. We ask that you follow-up with your pharmacy.

## 2023-10-12 NOTE — Telephone Encounter (Signed)
 Unable to pend test strips.

## 2023-10-17 ENCOUNTER — Ambulatory Visit: Payer: Self-pay

## 2023-10-17 NOTE — Telephone Encounter (Signed)
 1st attempt [id number]   Message from [place] C sent at 10/17/2023  3:33 PM EDT  Patient has just tested positve for Covid wanted to know if something could be sent in to help with symptoms

## 2023-10-17 NOTE — Telephone Encounter (Signed)
 FYI Only or Action Required?: Action required by provider: antiviral request.  Patient was last seen in primary care on 10/05/2022 by Rollene Almarie LABOR, MD.  Called Nurse Triage reporting Covid symptoms.  Symptoms began several days ago.  Interventions attempted: Rest, hydration, or home remedies.  Symptoms are: gradually worsening.  Triage Disposition: Call PCP Within 24 Hours  Patient/caregiver understands and will follow disposition?: No, wishes to speak with PCP   Reason for Disposition  [1] HIGH RISK patient (e.g., weak immune system, age > 64 years, obesity with BMI 30 or higher, pregnant, chronic lung disease or other chronic medical condition) AND [2] COVID symptoms (e.g., cough, fever)  (Exceptions: Already seen by PCP and no new or worsening symptoms.)  Answer Assessment - Initial Assessment Questions Additional info: 1) Patient called in to ask for medication for Covid 19 for both her and her husband (see spouse chart for his details). Declines acute office visit at this time.  2) Patient was in emergency room for Afib on 10/11/23. Hospital follow up scheduled with pcp.    1. COVID-19 DIAGNOSIS: How do you know that you have COVID? (e.g., positive lab test or self-test, diagnosed by doctor or NP/PA, symptoms after exposure).     Home test was negative 2. COVID-19 EXPOSURE: Was there any known exposure to COVID before the symptoms began? CDC Definition of close contact: within 6 feet (2 meters) for a total of 15 minutes or more over a 24-hour period.      Yes, at ER last week. Husband has Covid 19  3. ONSET: When did the COVID-19 symptoms start?      10/13/23 4. WORST SYMPTOM: What is your worst symptom? (e.g., cough, fever, shortness of breath, muscle aches)     Body pain 5. COUGH: Do you have a cough? If Yes, ask: How bad is the cough?       Mild 6. FEVER: Do you have a fever? If Yes, ask: What is your temperature, how was it measured, and when did it  start?     denies 7. RESPIRATORY STATUS: Describe your breathing? (e.g., normal; shortness of breath, wheezing, unable to speak)      Asthma, breathing well at this time.  8. BETTER-SAME-WORSE: Are you getting better, staying the same or getting worse compared to yesterday?  If getting worse, ask, In what way?     Same 9. OTHER SYMPTOMS: Do you have any other symptoms?  (e.g., chills, fatigue, headache, loss of smell or taste, muscle pain, sore throat)     Headache, dizzy, short of breath, back pain.  10. HIGH RISK DISEASE: Do you have any chronic medical problems? (e.g., asthma, heart or lung disease, weak immune system, obesity, etc.)       Asthma-using inhalers with effect  Protocols used: Coronavirus (COVID-19) Diagnosed or Suspected-A-AH

## 2023-10-18 ENCOUNTER — Ambulatory Visit: Admitting: Emergency Medicine

## 2023-10-18 NOTE — Telephone Encounter (Signed)
 For negative covid-19 test we would not prescribe anti-covid medication. Recommend visit to assess

## 2023-10-18 NOTE — Telephone Encounter (Signed)
 Spoke with patient and addressed this to the patient as she is feeling alittle bit better and will wait on making an appointment for now and when she is feeling a lot better they will call the office to make there appt for there physicals.

## 2023-10-25 ENCOUNTER — Inpatient Hospital Stay: Admitting: Internal Medicine

## 2023-11-02 ENCOUNTER — Other Ambulatory Visit: Payer: Self-pay | Admitting: Internal Medicine

## 2023-11-17 ENCOUNTER — Other Ambulatory Visit: Payer: Self-pay | Admitting: Cardiology

## 2023-11-24 ENCOUNTER — Other Ambulatory Visit: Payer: Self-pay | Admitting: Internal Medicine

## 2023-12-25 NOTE — Progress Notes (Deleted)
 Cardiology Office Note   Date:  12/25/2023   ID:  [name], DOB 1940-11-08, MRN [id number]  PCP:  Rollene Almarie LABOR, MD  Cardiologist:   Vina Gull, MD    Pt presents for follow up of PAF and HTN     History of Present Illness: [name] is a 83 y.o. female with a history of PAF (pt refused anticoagulation), AS, HTN, DM, HL, PUD, asthma, gout, OSA, remote PE (1990s)   I saw her in clinic in 2017   She has been seen several times by [name], the last time as a televisit in Nov 2020  The pt says she is still having some afib  Last spell in October  Lasted 6 hours   Took extra 120 mg dilt    BP was normal but HR was up Prior to that, the previous spell  was in the spring  2021. Spells usually start at midnight or 2 AM      BP at home 130/ 70 to 80s    But BP can drop to 80s/  Cant take HCTZ as BP will drop    Pt says she is active at home   Cares for husband  Works in yard  No problems with CP    Breathing is OK    I saw the pt in Dec 2021  She was admitted in Feb 2022 with AFbi with RVR    Rx with diltiazem    Placed on flecanide which was discontinued due to nausea   Last see by [name]  in Aug 2022   COmplained of dizzienss .  BP low   Carotid USN showed mild CV dz    Since seen the pt denies dizziness  She says her breathing is OK  She has been busy with sweeping leaves    On Sunday her BP was high   Noted pain in both shoulders and between shoulder blades    Patient says leaves falling  Busy   Sunday night she hurt between shoulder blades    Took pain med    Next night hurt again  BP 189  Took 1/2losartan At other times BP has been low   90/   Even 72/ at one point  Hurt between shoulder blades when happened    I saw the pt in 2022   She was seen by JAYSON Nanas and CHRISTELLA Lawrence in July 2024   No outpatient medications have been marked as taking for the 01/03/24 encounter (Appointment) with [name], MD.     Allergies:   Benazepril , Lopressor   [metoprolol ], Adhesive [tape], Morphine , Nifedipine, and Piroxicam   Past Medical History:  Diagnosis Date   Allergic rhinitis    Allergy    Anxiety    and panic attacks;pt states that she is claustrophobic   Asthma    Atrial fibrillation (HCC)    Used to take Coumadin -instructed to stop   Cataract    bilateral surgery to remove   Chronic back pain    hx buldging disc   Colon cancer (HCC) 2007   s/p surgery and chemo   Constipation    takes Colace prn   Diabetes mellitus    glimepiride    DJD (degenerative joint disease)    Dyspnea    Dysrhythmia    Fibromyalgia    but doesn't take any meds   Gastric ulcer    GERD (gastroesophageal reflux disease)    uses Tums prn  Gout    takes Colchicine  prn   Heart murmur    History of blood clots    right lung in early 90's   History of colon polyps    History of migraine headaches    last migraine 48yrs ago;Pt states she does get sinus headaches   History of PSVT (paroxysmal supraventricular tachycardia) no current problems   History of shingles    HLD (hyperlipidemia)    diet control - no meds   Hypertension    takes Micardis  and Diltiazem  daily   Osteoarthritis    Peptic ulcer disease    Peripheral edema    takes Furosemide  every other day   Sleep apnea    doesn't use CPAP    Past Surgical History:  Procedure Laterality Date   ANKLE SURGERY     tumor-benign removed left ankle   ANTERIOR CERVICAL DECOMP/DISCECTOMY FUSION  01/12/2011   Procedure: ANTERIOR CERVICAL DECOMPRESSION/DISCECTOMY FUSION 2 LEVELS;  Surgeon: Victory LABOR Pool;  Location: [facility];  Service: Neurosurgery;  Laterality: Bilateral;  Cervical five-six, six-seven anterior cervical discectomy and fusion with allograft and plating   ANTERIOR CERVICAL DECOMP/DISCECTOMY FUSION  06/11/2011   Procedure: ANTERIOR CERVICAL DECOMPRESSION/DISCECTOMY FUSION 1 LEVEL/HARDWARE REMOVAL;  Surgeon: Victory LABOR Gunnels, MD;  Location: [facility];  Service: Neurosurgery;   Laterality: N/A;  Cervical four-five anterior cervical decompression fusion with allograft and plating; Removal of Cervical five to seven plate   bilateral cataracts removed     caesarean section  1966/69/72   x 3   CARDIAC CATHETERIZATION  90's and 2005   CARPAL TUNNEL RELEASE Left 11/2013   cataract surgery  2010   COLECTOMY  2007   colon cancer   COLONOSCOPY     DILATION AND CURETTAGE OF UTERUS  02-19-11   & POLYP REMOVAL   GANGLION CYST EXCISION  > 35yrs ago   removed from left pointer finger   HEEL SPUR SURGERY Right 1992   x 2   HYSTEROSCOPY WITH D & C  02/19/2011   Procedure: DILATATION AND CURETTAGE /HYSTEROSCOPY;  Surgeon: Evalene SHAUNNA Organ, MD;  Location: [facility];  Service: Gynecology;  Laterality: N/A;  requests one hour   JOINT REPLACEMENT Left    knee   knee arthrosocpy     bil;couple of years before knee replacement   KNEE SURGERY     left TKA   LEFT HEART CATH AND CORONARY ANGIOGRAPHY N/A 04/21/2020   Procedure: LEFT HEART CATH AND CORONARY ANGIOGRAPHY;  Surgeon: Court Dorn PARAS, MD;  Location: [facility];  Service: Cardiovascular;  Laterality: N/A;   LUMBAR DISC SURGERY  2008   port a cath placed  2008   port a cath removed  2008   pyelonidal cystectomy     at age 47   SHOULDER ARTHROSCOPY WITH SUBACROMIAL DECOMPRESSION AND OPEN ROTATOR C Right 03/16/2013   Procedure: RIGHT SHOULDER ARTHROSCOPY WITH SUBACROMIAL DECOMPRESSION AND OPEN ROTATOR CUFF REPAIR, OPEN DISTAL CLAVICLE  RESECTION POSSIBLE BICEP TENODESIS ;  Surgeon: Elspeth JONELLE Her, MD;  Location: [facility];  Service: Orthopedics;  Laterality: Right;   TOTAL KNEE ARTHROPLASTY Right 11/02/2022   Procedure: TOTAL KNEE ARTHROPLASTY;  Surgeon: Ernie Cough, MD;  Location: [facility];  Service: Orthopedics;  Laterality: Right;   TRIGGER FINGER RELEASE Left    thumb, tendonitis, tumor removed   TUBAL LIGATION  1972     Social History:  The patient  reports that she quit smoking about 32 years ago. Her smoking  use included cigarettes. She started smoking about 62 years ago. She has a 45 pack-year smoking history. She has never used smokeless tobacco. She reports that she does not drink alcohol and does not use drugs.   Family History:  The patient's family history includes Bladder Cancer in her brother; Coronary artery disease in an other family member; Heart attack in her mother; Heart disease in her father; Hypertension in her mother and sister; Kidney cancer in her sister; Liver cancer in her brother; Stroke in her mother.    ROS:  Please see the history of present illness. All other systems are reviewed and  Negative to the above problem except as noted.    PHYSICAL EXAM: VS:  LMP 12/30/2000   GEN: Well nourished, well developed, in no acute distress  HEENT: normal  Neck: no JVD, Cardiac: RRR; Gi II/VI systolic murmur LSB to base  No LE  edema  Chest  Tender in sub xiphoid region   Respiratory:  clear to auscultation bilaterally,  GI: soft, nontender, nondistended, + BS  No hepatomegaly  MS: no deformity Moving all extremities   Skin: warm and dry, no rash Neuro:  Strength and sensation are intact Psych: euthymic mood, full affect   EKG   NOt done    Lipid Panel    Component Value Date/Time   CHOL 220 (H) 07/02/2022 1507   CHOL 164 10/17/2020 1108   TRIG 140.0 07/02/2022 1507   HDL 60.30 07/02/2022 1507   HDL 72 10/17/2020 1108   CHOLHDL 4 07/02/2022 1507   VLDL 28.0 07/02/2022 1507   LDLCALC 132 (H) 07/02/2022 1507   LDLCALC 64 10/17/2020 1108   LDLDIRECT 128.0 03/24/2021 1401      Wt Readings from Last 3 Encounters:  07/12/23 184 lb (83.5 kg)  12/09/22 184 lb 3.2 oz (83.6 kg)  11/02/22 196 lb (88.9 kg)      ASSESSMENT AND PLAN:  1.  PAF   Pt without significant symtpoms   Follow   Pt refuses anticoagulation  2  HTN  BP is labile   High and very low   I have asked her to keep log of bps, especially when low   Switch losartan  to 50 bid    Recomm she come back  in  spring with BP readings and BP cuff    3 AV Very mild AS   Follow      4   HL   Levels were excellent   Stopped lipitor however   WIlll follow   F/U in the spring wit hcuff and readings      Current medicines are reviewed at length with the patient today.  The patient does not have concerns regarding medicines.  Signed, Vina Gull, MD  12/25/2023 5:12 PM    [facility] [address] [place], [place], [place]  [address] Phone: [phone]; Fax: [phone]

## 2023-12-26 ENCOUNTER — Encounter: Payer: Self-pay | Admitting: Internal Medicine

## 2023-12-26 ENCOUNTER — Ambulatory Visit: Admitting: Internal Medicine

## 2023-12-26 VITALS — BP 130/70 | HR 73 | Temp 98.4°F | Ht 63.0 in | Wt 193.2 lb

## 2023-12-26 DIAGNOSIS — I480 Paroxysmal atrial fibrillation: Secondary | ICD-10-CM

## 2023-12-26 DIAGNOSIS — G8929 Other chronic pain: Secondary | ICD-10-CM | POA: Diagnosis not present

## 2023-12-26 DIAGNOSIS — Z0000 Encounter for general adult medical examination without abnormal findings: Secondary | ICD-10-CM

## 2023-12-26 DIAGNOSIS — M109 Gout, unspecified: Secondary | ICD-10-CM | POA: Diagnosis not present

## 2023-12-26 DIAGNOSIS — E1169 Type 2 diabetes mellitus with other specified complication: Secondary | ICD-10-CM | POA: Diagnosis not present

## 2023-12-26 DIAGNOSIS — I10 Essential (primary) hypertension: Secondary | ICD-10-CM

## 2023-12-26 DIAGNOSIS — N1832 Chronic kidney disease, stage 3b: Secondary | ICD-10-CM

## 2023-12-26 DIAGNOSIS — E785 Hyperlipidemia, unspecified: Secondary | ICD-10-CM | POA: Diagnosis not present

## 2023-12-26 DIAGNOSIS — M5441 Lumbago with sciatica, right side: Secondary | ICD-10-CM | POA: Diagnosis not present

## 2023-12-26 LAB — COMPREHENSIVE METABOLIC PANEL WITH GFR
ALT: 10 U/L (ref 0–35)
AST: 16 U/L (ref 0–37)
Albumin: 4.4 g/dL (ref 3.5–5.2)
Alkaline Phosphatase: 114 U/L (ref 39–117)
BUN: 32 mg/dL — ABNORMAL HIGH (ref 6–23)
CO2: 25 meq/L (ref 19–32)
Calcium: 9.4 mg/dL (ref 8.4–10.5)
Chloride: 105 meq/L (ref 96–112)
Creatinine, Ser: 1.52 mg/dL — ABNORMAL HIGH (ref 0.40–1.20)
GFR: 31.92 mL/min — ABNORMAL LOW (ref >60.00)
Glucose, Bld: 106 mg/dL — ABNORMAL HIGH (ref 70–99)
Potassium: 4.1 meq/L (ref 3.5–5.1)
Sodium: 139 meq/L (ref 135–145)
Total Bilirubin: 0.4 mg/dL (ref 0.2–1.2)
Total Protein: 7 g/dL (ref 6.0–8.3)

## 2023-12-26 LAB — LIPID PANEL
Cholesterol: 239 mg/dL — ABNORMAL HIGH (ref 0–200)
HDL: 63.2 mg/dL (ref >39.00)
NonHDL: 175.86
Total CHOL/HDL Ratio: 4
Triglycerides: 412 mg/dL — ABNORMAL HIGH (ref 0.0–149.0)
VLDL: 82.4 mg/dL — ABNORMAL HIGH (ref 0.0–40.0)

## 2023-12-26 LAB — CBC
HCT: 40.6 % (ref 36.0–46.0)
Hemoglobin: 13.9 g/dL (ref 12.0–15.0)
MCHC: 34.1 g/dL (ref 30.0–36.0)
MCV: 89.8 fl (ref 78.0–100.0)
Platelets: 292 K/uL (ref 150.0–400.0)
RBC: 4.52 Mil/uL (ref 3.87–5.11)
RDW: 14 % (ref 11.5–15.5)
WBC: 9.4 K/uL (ref 4.0–10.5)

## 2023-12-26 LAB — MICROALBUMIN / CREATININE URINE RATIO
Creatinine,U: 272 mg/dL
Microalb Creat Ratio: 21.7 mg/g (ref 0.0–30.0)
Microalb, Ur: 5.9 mg/dL — ABNORMAL HIGH (ref 0.0–1.9)

## 2023-12-26 LAB — LDL CHOLESTEROL, DIRECT: Direct LDL: 128 mg/dL

## 2023-12-26 LAB — URIC ACID: Uric Acid, Serum: 5 mg/dL (ref 2.4–7.0)

## 2023-12-26 LAB — HEMOGLOBIN A1C: Hgb A1c MFr Bld: 6.4 % (ref 4.6–6.5)

## 2023-12-26 MED ORDER — TRAMADOL HCL 50 MG PO TABS: 50.0000 mg | ORAL_TABLET | Freq: Every day | ORAL | 0 refills | Status: AC | PRN

## 2023-12-26 MED ORDER — ONDANSETRON HCL 4 MG PO TABS: 4.0000 mg | ORAL_TABLET | Freq: Three times a day (TID) | ORAL | 0 refills | Status: AC | PRN

## 2023-12-26 NOTE — Progress Notes (Signed)
   Subjective:   Patient ID: [name], female    DOB: [birth year], 83 y.o.   MRN: [id number]  The patient is here for physical. Pertinent topics discussed: Discussed the use of AI scribe software for clinical note transcription with the patient, who gave verbal consent to proceed. History of Present Illness [name] is an 83 year old female with hip arthritis who presents with hip pain radiating to the knee. She is accompanied by her oldest daughter.  She has been experiencing hip pain for the past year, attributed to arthritis, with radiation to the knee. The pain is described as a pinching sensation, particularly when standing or moving after sitting, and feels like 'somebody's got a knife' in her hip. Physical activities such as working in the yard or shopping exacerbate the pain, making mobility challenging.  She has not been taking any medication recently as she is out of tramadol , which she uses only when the pain is severe or after significant activity. She emphasizes responsible use of the medication. She uses a mobility scooter for longer distances but struggles with daily activities due to pain.  She also has degenerative disc disease in her lower back, contributing to discomfort. She experiences tightness in her lower back, necessitating rest during activities like visiting the zoo or shopping. This condition initially led to her tramadol  use.  She reports a recent sinus infection that led to a cough, but no current chest pain or breathing problems. Occasional nausea is noted, and she is out of her nausea medication.  PMH, [facility], social history reviewed and updated  Review of Systems  Constitutional: Negative.   HENT: Negative.    Eyes: Negative.   Respiratory:  Positive for cough. Negative for chest tightness and shortness of breath.   Cardiovascular:  Negative for chest pain, palpitations and leg swelling.  Gastrointestinal:  Negative for abdominal distention, abdominal  pain, constipation, diarrhea, nausea and vomiting.  Musculoskeletal:  Positive for arthralgias and back pain.  Skin: Negative.   Neurological: Negative.   Psychiatric/Behavioral: Negative.      Objective:  Physical Exam Constitutional:      Appearance: She is well-developed.  HENT:     Head: Normocephalic and atraumatic.  Cardiovascular:     Rate and Rhythm: Normal rate and regular rhythm.  Pulmonary:     Effort: Pulmonary effort is normal. No respiratory distress.     Breath sounds: Normal breath sounds. No wheezing or rales.  Abdominal:     General: Bowel sounds are normal. There is no distension.     Palpations: Abdomen is soft.     Tenderness: There is no abdominal tenderness.  Musculoskeletal:     Cervical back: Normal range of motion.  Skin:    General: Skin is warm and dry.  Neurological:     Mental Status: She is alert and oriented to person, place, and time.     Coordination: Coordination normal.     Vitals:   12/26/23 1430  BP: 130/70  Pulse: 73  Temp: 98.4 F (36.9 C)  TempSrc: Oral  SpO2: 98%  Weight: 193 lb 3.2 oz (87.6 kg)  Height: 5' 3 (1.6 m)    Assessment & Plan:

## 2023-12-26 NOTE — Patient Instructions (Signed)
 We will check the labs today.   We have sent in the tramadol  to use as needed for the pain.

## 2023-12-28 ENCOUNTER — Ambulatory Visit: Payer: Self-pay | Admitting: Internal Medicine

## 2023-12-30 ENCOUNTER — Encounter: Payer: Self-pay | Admitting: Internal Medicine

## 2023-12-30 NOTE — Assessment & Plan Note (Signed)
 Checking lipid panel and adjust as needed.

## 2023-12-30 NOTE — Assessment & Plan Note (Signed)
 Flu shot declines. Pneumonia declines. Shingrix declines. Tetanus declines. Colonoscopy up to date aged out. Mammogram aged out, pap smear aged out and dexa complete. Counseled about sun safety and mole surveillance. Counseled about the dangers of distracted driving. Given 10 year screening recommendations.

## 2023-12-30 NOTE — Assessment & Plan Note (Signed)
 Checking uric acid and adjust as needed. No flare today.

## 2023-12-30 NOTE — Assessment & Plan Note (Signed)
 Checking lipid panel and HGA1c and UACR and CMP. Adjust as needed diet controlled. On ARB

## 2023-12-30 NOTE — Assessment & Plan Note (Signed)
 Taking diltiazem  and aspirin . Continue.

## 2023-12-30 NOTE — Assessment & Plan Note (Signed)
 Refill tramadol  which she uses rarely. Reviewed PDMP and appropriate.

## 2023-12-30 NOTE — Assessment & Plan Note (Signed)
 Checking CMP for stability.

## 2023-12-30 NOTE — Assessment & Plan Note (Signed)
 BP at goal checking CMP and adjust regimen as needed.

## 2024-01-03 ENCOUNTER — Ambulatory Visit: Admitting: Internal Medicine

## 2024-01-04 MED ORDER — PRAVASTATIN SODIUM 20 MG PO TABS: 20.0000 mg | ORAL_TABLET | Freq: Every day | ORAL | 3 refills | Status: AC

## 2024-01-05 ENCOUNTER — Other Ambulatory Visit: Payer: Self-pay | Admitting: Internal Medicine

## 2024-01-16 ENCOUNTER — Other Ambulatory Visit: Payer: Self-pay | Admitting: Physician Assistant

## 2024-02-03 ENCOUNTER — Other Ambulatory Visit: Payer: Self-pay | Admitting: Cardiology

## 2024-02-07 ENCOUNTER — Other Ambulatory Visit: Payer: Self-pay | Admitting: Physician Assistant

## 2024-02-08 MED ORDER — DILTIAZEM HCL ER COATED BEADS 240 MG PO CP24: ORAL_CAPSULE | ORAL | 1 refills | Status: DC

## 2024-02-09 ENCOUNTER — Telehealth: Payer: Self-pay | Admitting: Cardiology

## 2024-02-09 NOTE — Telephone Encounter (Signed)
°*  STAT* If patient is at the pharmacy, call can be transferred to refill team.   1. Which medications need to be refilled? (please list name of each medication and dose if known)  diltiazem  (CARDIZEM  CD) 120 MG 24 hr capsule   2. Would you like to learn more about the convenience, safety, & potential cost savings by using the [facility]? no   3. Are you open to using the [facility] (Type [facility]. no   4. Which pharmacy/location (including street and city if local pharmacy) is medication to be sent to?  [facility] - [place], [place] - [address]   5. Do they need a 30 day or 90 day supply? 90 day     Has 240 mg needs the 120 MG

## 2024-02-10 NOTE — Telephone Encounter (Signed)
 Refill of Diltiazem  120 was sent in 02/06/24.

## 2024-02-13 ENCOUNTER — Other Ambulatory Visit: Payer: Self-pay

## 2024-02-13 MED ORDER — DILTIAZEM HCL ER COATED BEADS 240 MG PO CP24: ORAL_CAPSULE | ORAL | 0 refills | Status: AC

## 2024-03-13 ENCOUNTER — Telehealth: Payer: Self-pay

## 2024-03-13 NOTE — Telephone Encounter (Signed)
 Copied from CRM [id number]. Topic: Clinical - Medication Refill >> Mar 13, 2024 11:58 AM Vena HERO wrote: Medication: Colchicine  (MITIGARE ) 0.6 MG CAPS  Has the patient contacted their pharmacy? No (Agent: If no, request that the patient contact the pharmacy for the refill. If patient does not wish to contact the pharmacy document the reason why and proceed with request.) (Agent: If yes, when and what did the pharmacy advise?)  This is the patient's preferred pharmacy:  [id number] [place], [place] - [address] [place] [address] Phone: [phone] Fax: [phone]   Is this the correct pharmacy for this prescription? Yes If no, delete pharmacy and type the correct one.   Has the prescription been filled recently? No  Is the patient out of the medication? Yes  Has the patient been seen for an appointment in the last year OR does the patient have an upcoming appointment? Yes  Can we respond through MyChart? No, phone call preferred  Agent: Please be advised that Rx refills may take up to 3 business days. We ask that you follow-up with your pharmacy.

## 2024-03-14 ENCOUNTER — Other Ambulatory Visit: Payer: Self-pay

## 2024-03-14 ENCOUNTER — Other Ambulatory Visit: Payer: Self-pay | Admitting: Internal Medicine

## 2024-03-14 DIAGNOSIS — M109 Gout, unspecified: Secondary | ICD-10-CM

## 2024-03-14 MED ORDER — COLCHICINE 0.6 MG PO CAPS: 0.6000 mg | ORAL_CAPSULE | Freq: Every day | ORAL | 0 refills | Status: DC | PRN

## 2024-03-14 NOTE — Telephone Encounter (Signed)
 LVM rx sent in to pharmacy.

## 2024-03-19 ENCOUNTER — Other Ambulatory Visit: Payer: Self-pay | Admitting: Internal Medicine

## 2024-03-19 ENCOUNTER — Other Ambulatory Visit: Payer: Self-pay | Admitting: Cardiology

## 2024-03-19 NOTE — Progress Notes (Signed)
 "   Cardiology Office Note   Date:  03/23/2024   ID:  Julie, Cooper 25-Jul-1940, MRN [id number]  PCP:  Rollene Almarie LABOR, MD  Cardiologist:   Vina Gull, MD    Pt presents for follow up of PAF and HTN     History of Present Illness: [name] is a 84 y.o. female with a history of PAF (pt has refused anticoagulation), AS, HTN, DM, HL, PUD, asthma, gout, OSA, remote PE (1990s)   I    Feb 2022   Pt admitted with afib with RVR    Rx with diltiazem    Placed on flecanide which was discontinued due to nausea   2022  Carotid USN showed mild CV dz     She was last seen in cardiology  by CHRISTELLA Lawrence in 2024  Aug 2025  Pt admitted for atrial fibrilaltion with RVR     Since discharge the pt denies any further spells of afib  She denies CP  Only gets short of breath when heart flutters  She has had some dizziness    Says she will feel fine when gets up  Then gets dizzy, arms hurt, gets tired  BP has gone down to 70    When lays down will go back up Once occurred while in shower    No syncope     She able to do chores (vacuum, etc) around house without SOB or CP   She is in severe pain now from L hip (back, hip, leg)   Plan is for surgery     Busy caring for husband who has dementia  Current Meds  Medication Sig   albuterol  (VENTOLIN  HFA) 108 (90 Base) MCG/ACT inhaler Inhale 2 puffs into the lungs every 6 (six) hours as needed for wheezing or shortness of breath.   aspirin  EC 81 MG tablet Take 81-325 mg by mouth daily as needed for moderate pain (pain score 4-6).   diltiazem  (CARDIZEM  CD) 120 MG 24 hr capsule TAKE 1 CAP DAILY W/DILTIAZEM  240MG  TO EQUAL 360MG /DAY. MAY TAKE 1 EXTRA CAP AS NEEDED FOR BREAK THRU AFIB   diltiazem  (CARDIZEM  CD) 240 MG 24 hr capsule TAKE 1 CAPSULE EVERY DAY IN ADDITION TO DILTIAZEM  120MG  FOR TOTAL OF 360MG  DAILY   docusate sodium  (COLACE) 100 MG capsule Take 100 mg by mouth 2 (two) times daily as needed for mild constipation.   losartan  (COZAAR ) 100  MG tablet TAKE 1 TABLET EVERY DAY   ondansetron  (ZOFRAN ) 4 MG tablet Take 1 tablet (4 mg total) by mouth every 8 (eight) hours as needed for nausea or vomiting.   pravastatin  (PRAVACHOL ) 20 MG tablet Take 1 tablet (20 mg total) by mouth daily.   sodium chloride  (OCEAN) 0.65 % SOLN nasal spray Place 1 spray into both nostrils as needed for congestion.   traMADol  (ULTRAM ) 50 MG tablet Take 1 tablet (50 mg total) by mouth daily as needed.     Allergies:   Benazepril , Lopressor  [metoprolol ], Adhesive [tape], Morphine , Nifedipine, and Piroxicam   Past Medical History:  Diagnosis Date   Allergic rhinitis    Allergy    Anxiety    and panic attacks;pt states that she is claustrophobic   Asthma    Atrial fibrillation (HCC)    Used to take Coumadin -instructed to stop   Cataract    bilateral surgery to remove   Chronic back pain    hx buldging disc   Colon cancer (HCC) 2007   s/p  surgery and chemo   Constipation    takes Colace prn   Diabetes mellitus    glimepiride    DJD (degenerative joint disease)    Dyspnea    Dysrhythmia    Fibromyalgia    but doesn't take any meds   Gastric ulcer    GERD (gastroesophageal reflux disease)    uses Tums prn   Gout    takes Colchicine  prn   Heart murmur    History of blood clots    right lung in early 90's   History of colon polyps    History of migraine headaches    last migraine 34yrs ago;Pt states she does get sinus headaches   History of PSVT (paroxysmal supraventricular tachycardia) no current problems   History of shingles    HLD (hyperlipidemia)    diet control - no meds   Hypertension    takes Micardis  and Diltiazem  daily   Osteoarthritis    Peptic ulcer disease    Peripheral edema    takes Furosemide  every other day   Sleep apnea    doesn't use CPAP    Past Surgical History:  Procedure Laterality Date   ANKLE SURGERY     tumor-benign removed left ankle   ANTERIOR CERVICAL DECOMP/DISCECTOMY FUSION  01/12/2011   Procedure:  ANTERIOR CERVICAL DECOMPRESSION/DISCECTOMY FUSION 2 LEVELS;  Surgeon: Victory LABOR Pool;  Location: [facility];  Service: Neurosurgery;  Laterality: Bilateral;  Cervical five-six, six-seven anterior cervical discectomy and fusion with allograft and plating   ANTERIOR CERVICAL DECOMP/DISCECTOMY FUSION  06/11/2011   Procedure: ANTERIOR CERVICAL DECOMPRESSION/DISCECTOMY FUSION 1 LEVEL/HARDWARE REMOVAL;  Surgeon: Victory LABOR Gunnels, MD;  Location: [facility];  Service: Neurosurgery;  Laterality: N/A;  Cervical four-five anterior cervical decompression fusion with allograft and plating; Removal of Cervical five to seven plate   bilateral cataracts removed     caesarean section  1966/69/72   x 3   CARDIAC CATHETERIZATION  90's and 2005   CARPAL TUNNEL RELEASE Left 11/2013   cataract surgery  2010   COLECTOMY  2007   colon cancer   COLONOSCOPY     DILATION AND CURETTAGE OF UTERUS  02-19-11   & POLYP REMOVAL   GANGLION CYST EXCISION  > 29yrs ago   removed from left pointer finger   HEEL SPUR SURGERY Right 1992   x 2   HYSTEROSCOPY WITH D & C  02/19/2011   Procedure: DILATATION AND CURETTAGE /HYSTEROSCOPY;  Surgeon: Evalene SHAUNNA Organ, MD;  Location: [facility];  Service: Gynecology;  Laterality: N/A;  requests one hour   JOINT REPLACEMENT Left    knee   knee arthrosocpy     bil;couple of years before knee replacement   KNEE SURGERY     left TKA   LEFT HEART CATH AND CORONARY ANGIOGRAPHY N/A 04/21/2020   Procedure: LEFT HEART CATH AND CORONARY ANGIOGRAPHY;  Surgeon: Court Dorn PARAS, MD;  Location: [facility];  Service: Cardiovascular;  Laterality: N/A;   LUMBAR DISC SURGERY  2008   port a cath placed  2008   port a cath removed  2008   pyelonidal cystectomy     at age 45   SHOULDER ARTHROSCOPY WITH SUBACROMIAL DECOMPRESSION AND OPEN ROTATOR C Right 03/16/2013   Procedure: RIGHT SHOULDER ARTHROSCOPY WITH SUBACROMIAL DECOMPRESSION AND OPEN ROTATOR CUFF REPAIR, OPEN DISTAL CLAVICLE  RESECTION  POSSIBLE BICEP TENODESIS ;  Surgeon: Elspeth JONELLE Her, MD;  Location: [facility];  Service: Orthopedics;  Laterality: Right;   TOTAL  KNEE ARTHROPLASTY Right 11/02/2022   Procedure: TOTAL KNEE ARTHROPLASTY;  Surgeon: Ernie Cough, MD;  Location: [facility];  Service: Orthopedics;  Laterality: Right;   TRIGGER FINGER RELEASE Left    thumb, tendonitis, tumor removed   TUBAL LIGATION  1972     Social History:  The patient  reports that she quit smoking about 33 years ago. Her smoking use included cigarettes. She started smoking about 63 years ago. She has a 45 pack-year smoking history. She has never used smokeless tobacco. She reports that she does not drink alcohol and does not use drugs.   Family History:  The patient's family history includes Bladder Cancer in her brother; Coronary artery disease in an other family member; Heart attack in her mother; Heart disease in her father; Hypertension in her mother and sister; Kidney cancer in her sister; Liver cancer in her brother; Stroke in her mother.    ROS:  Please see the history of present illness. All other systems are reviewed and  Negative to the above problem except as noted.    PHYSICAL EXAM: VS:  BP (!) 166/89 (BP Location: Left Arm, Patient Position: Sitting, Cuff Size: Large)   Pulse 68   Resp 17   Ht 5' 3 (1.6 m)   Wt 186 lb (84.4 kg)   LMP 12/30/2000   SpO2 96%   BMI 32.95 kg/m   ORthostatics  BP  Laying  158/76  P 60   Sitting 152/77  P 62  Standing 127/77   P 78  Standing 4 min 145/75  P 75    GEN: Obese 84 yo in no acute distress  HEENT: normal  Neck: no JVD, Cardiac: RRR; Gi II/VI systolic murmur LSB to base  No LE  edema  Respiratory:  clear to auscultation GI: soft, nontender, No hepatomegaly  Ext  No LE edema    LHC  2022    Normal coronary arteries   Echo     1. Possible fusion of noncoronary and left coronary cusps but no significant AS by doppler. 2. Left ventricular ejection fraction, by estimation, is 60 to 65%  . The left ventricle has normal function. The left ventricle has no regional wall motion abnormalities. There is mild left ventricular hypertrophy. Left ventricular diastolic function could not be evaluated. 3. Right ventricular systolic function is normal. The right ventricular size is normal. Tricuspid regurgitation signal is inadequate for assessing PA pressure. 4. The mitral valve is normal in structure. Trivial mitral valve regurgitation. No evidence of mitral stenosis. 5. The aortic valve is abnormal. Aortic valve regurgitation is trivial. Mild to moderate aortic valve sclerosis/ calcification is present, without any evidence of aortic stenosis. 6. The inferior vena cava is normal in size with greater than 50% respiratory variability, suggesting right atrial pressure of 3 mmHg.   EKG   NSR with sinus arrhythmia     Lipid Panel    Component Value Date/Time   CHOL 239 (H) 12/26/2023 1520   CHOL 164 10/17/2020 1108   TRIG (H) 12/26/2023 1520    412.0 Triglyceride is over 400; calculations on Lipids are invalid.   HDL 63.20 12/26/2023 1520   HDL 72 10/17/2020 1108   CHOLHDL 4 12/26/2023 1520   VLDL 82.4 (H) 12/26/2023 1520   LDLCALC 132 (H) 07/02/2022 1507   LDLCALC 64 10/17/2020 1108   LDLDIRECT 128.0 12/26/2023 1520      Wt Readings from Last 3 Encounters:  03/23/24 186 lb (84.4 kg)  12/26/23 193 lb  3.2 oz (87.6 kg)  07/12/23 184 lb (83.5 kg)      ASSESSMENT AND PLAN:  1.  PAF   Pt with infrequent episodes of atrial fibrillation  Last hosp in Aug 2025  Denies recnetly    Follow   Again reviewed anticoagulation and stroke risks   She understands risk of CVA  Refuses  2  HTN  BP is very labile   She is high today but with standing is transiently orthostatic     Occurring at home    I Have told her to get chair in shower    If dizzy, needs to sit Stay hydrated    If symptoms worsen, call   3  Aortic valve  Echo in 2022 Abnormal on echo in    Mild to mod sclerosis noted of AV    No stenosis   With upcoming surgery will repeat to confirm no signficiant change  4   HL  PT off off statin   Last LDLD in Nov 2025 was 128, HDL 63  Trig 140  5  Metabolics  A1C 6.4  Limit carbs  6 PReop assessment   Pt being evaluated for hip surgery by Dr Cherylene   From cardiac standpoint I think she is at low risk for major cardiac event and OK to proceed   Will get echo to update but this should not delay surgery   Will plan follow up in 6 months, sooner if dizziness worsens     Current medicines are reviewed at length with the patient today.  The patient does not have concerns regarding medicines.  Signed, Vina Gull, MD  "

## 2024-03-22 ENCOUNTER — Ambulatory Visit: Admitting: Internal Medicine

## 2024-03-23 ENCOUNTER — Telehealth: Payer: Self-pay | Admitting: Internal Medicine

## 2024-03-23 ENCOUNTER — Encounter: Payer: Self-pay | Admitting: Internal Medicine

## 2024-03-23 ENCOUNTER — Ambulatory Visit: Attending: Internal Medicine | Admitting: Internal Medicine

## 2024-03-23 VITALS — BP 166/89 | HR 68 | Resp 17 | Ht 63.0 in | Wt 186.0 lb

## 2024-03-23 DIAGNOSIS — I480 Paroxysmal atrial fibrillation: Secondary | ICD-10-CM | POA: Diagnosis not present

## 2024-03-23 DIAGNOSIS — R072 Precordial pain: Secondary | ICD-10-CM

## 2024-03-23 NOTE — Patient Instructions (Signed)
 Medication Instructions:  No medication changes were made during today's visit.  *If you need a refill on your cardiac medications before your next appointment, please call your pharmacy*  Lab Work: No labs were ordered during today's visit.  If you have labs (blood work) drawn today and your tests are completely normal, you will receive your results only by: MyChart Message (if you have MyChart) OR A paper copy in the mail If you have any lab test that is abnormal or we need to change your treatment, we will call you to review the results.  Testing/Procedures: No procedures were ordered during today's visit.   Follow-Up: At [facility], you and your health needs are our priority.  As part of our continuing mission to provide you with exceptional heart care, our providers are all part of one team.  This team includes your primary Cardiologist (physician) and Advanced Practice Providers or APPs (Physician Assistants and Nurse Practitioners) who all work together to provide you with the care you need, when you need it.  Your next appointment:   7 month(s)  Provider:   Dr. Okey   We recommend signing up for the patient portal called MyChart.  Sign up information is provided on this After Visit Summary.  MyChart is used to connect with patients for Virtual Visits (Telemedicine).  Patients are able to view lab/test results, encounter notes, upcoming appointments, etc.  Non-urgent messages can be sent to your provider as well.   To learn more about what you can do with MyChart, go to [email or web address].   Other Instructions

## 2024-03-23 NOTE — Telephone Encounter (Signed)
 Pt seen in clinic today After review of last echo I would recomm a repeat echo to reevaluate aortic valve   Last echo in 2022  Please schedule at her convenience

## 2024-03-26 NOTE — Addendum Note (Signed)
 Addended by: JOSHUA ANDREZ PARAS on: 03/26/2024 11:30 AM   Modules accepted: Orders

## 2024-03-27 ENCOUNTER — Ambulatory Visit (HOSPITAL_COMMUNITY): Admission: RE | Admit: 2024-03-27 | Discharge: 2024-03-27 | Attending: Internal Medicine | Admitting: Internal Medicine

## 2024-03-27 DIAGNOSIS — R072 Precordial pain: Secondary | ICD-10-CM

## 2024-03-27 LAB — ECHOCARDIOGRAM COMPLETE
AR max vel: 1.56 cm2
AV Area VTI: 1.59 cm2
AV Area mean vel: 1.44 cm2
AV Mean grad: 11 mmHg
AV Peak grad: 17.5 mmHg
Ao pk vel: 2.09 m/s
Area-P 1/2: 3.37 cm2
P 1/2 time: 408 ms
S' Lateral: 2.9 cm

## 2024-03-27 NOTE — Telephone Encounter (Signed)
 Spoke to patient she stated she is concerned since this past Sunday night her B/P has been elevated 202/94.Other readings listed below.At present 135/77.Pulse ranging 61 to 74. Medications are listed correctly.Advised I will send message to Dr.Ross for advice.

## 2024-03-29 ENCOUNTER — Observation Stay (HOSPITAL_COMMUNITY)
Admission: EM | Admit: 2024-03-29 | Discharge: 2024-03-29 | Disposition: A | Discharge status: Home / Self Care | Source: Home / Self Care | Attending: Emergency Medicine | Admitting: Emergency Medicine

## 2024-03-29 ENCOUNTER — Other Ambulatory Visit: Payer: Self-pay

## 2024-03-29 ENCOUNTER — Other Ambulatory Visit (HOSPITAL_COMMUNITY): Payer: Self-pay

## 2024-03-29 ENCOUNTER — Emergency Department (HOSPITAL_COMMUNITY)

## 2024-03-29 DIAGNOSIS — R079 Chest pain, unspecified: Secondary | ICD-10-CM | POA: Diagnosis present

## 2024-03-29 DIAGNOSIS — G8929 Other chronic pain: Secondary | ICD-10-CM | POA: Diagnosis present

## 2024-03-29 DIAGNOSIS — I4891 Unspecified atrial fibrillation: Secondary | ICD-10-CM | POA: Diagnosis not present

## 2024-03-29 DIAGNOSIS — N1832 Chronic kidney disease, stage 3b: Secondary | ICD-10-CM | POA: Diagnosis present

## 2024-03-29 DIAGNOSIS — G4733 Obstructive sleep apnea (adult) (pediatric): Secondary | ICD-10-CM | POA: Diagnosis present

## 2024-03-29 DIAGNOSIS — I10 Essential (primary) hypertension: Secondary | ICD-10-CM | POA: Diagnosis present

## 2024-03-29 DIAGNOSIS — I480 Paroxysmal atrial fibrillation: Secondary | ICD-10-CM | POA: Diagnosis present

## 2024-03-29 DIAGNOSIS — R002 Palpitations: Principal | ICD-10-CM

## 2024-03-29 DIAGNOSIS — M109 Gout, unspecified: Secondary | ICD-10-CM | POA: Diagnosis present

## 2024-03-29 DIAGNOSIS — E1169 Type 2 diabetes mellitus with other specified complication: Secondary | ICD-10-CM | POA: Diagnosis present

## 2024-03-29 DIAGNOSIS — M797 Fibromyalgia: Secondary | ICD-10-CM | POA: Diagnosis present

## 2024-03-29 LAB — LIPID PANEL
Cholesterol: 193 mg/dL (ref 0–200)
HDL: 67 mg/dL
LDL Cholesterol: 105 mg/dL — ABNORMAL HIGH (ref 0–99)
Total CHOL/HDL Ratio: 2.9 ratio
Triglycerides: 104 mg/dL
VLDL: 21 mg/dL (ref 0–40)

## 2024-03-29 LAB — COMPREHENSIVE METABOLIC PANEL WITH GFR
ALT: 11 U/L (ref 0–44)
AST: 22 U/L (ref 15–41)
Albumin: 4.4 g/dL (ref 3.5–5.0)
Alkaline Phosphatase: 104 U/L (ref 38–126)
Anion gap: 13 (ref 5–15)
BUN: 31 mg/dL — ABNORMAL HIGH (ref 8–23)
CO2: 23 mmol/L (ref 22–32)
Calcium: 9.9 mg/dL (ref 8.9–10.3)
Chloride: 104 mmol/L (ref 98–111)
Creatinine, Ser: 1.32 mg/dL — ABNORMAL HIGH (ref 0.44–1.00)
GFR, Estimated: 40 mL/min — ABNORMAL LOW
Glucose, Bld: 113 mg/dL — ABNORMAL HIGH (ref 70–99)
Potassium: 3.9 mmol/L (ref 3.5–5.1)
Sodium: 140 mmol/L (ref 135–145)
Total Bilirubin: 0.5 mg/dL (ref 0.0–1.2)
Total Protein: 6.9 g/dL (ref 6.5–8.1)

## 2024-03-29 LAB — CBC WITH DIFFERENTIAL/PLATELET
Abs Immature Granulocytes: 0.01 10*3/uL (ref 0.00–0.07)
Basophils Absolute: 0 10*3/uL (ref 0.0–0.1)
Basophils Relative: 0 %
Eosinophils Absolute: 0.3 10*3/uL (ref 0.0–0.5)
Eosinophils Relative: 4 %
HCT: 42.1 % (ref 36.0–46.0)
Hemoglobin: 14.5 g/dL (ref 12.0–15.0)
Immature Granulocytes: 0 %
Lymphocytes Relative: 38 %
Lymphs Abs: 3 10*3/uL (ref 0.7–4.0)
MCH: 30.9 pg (ref 26.0–34.0)
MCHC: 34.4 g/dL (ref 30.0–36.0)
MCV: 89.8 fL (ref 80.0–100.0)
Monocytes Absolute: 0.6 10*3/uL (ref 0.1–1.0)
Monocytes Relative: 7 %
Neutro Abs: 4 10*3/uL (ref 1.7–7.7)
Neutrophils Relative %: 51 %
Platelets: 191 10*3/uL (ref 150–400)
RBC: 4.69 MIL/uL (ref 3.87–5.11)
RDW: 13.2 % (ref 11.5–15.5)
WBC: 7.9 10*3/uL (ref 4.0–10.5)
nRBC: 0 % (ref 0.0–0.2)

## 2024-03-29 LAB — MAGNESIUM: Magnesium: 2.2 mg/dL (ref 1.7–2.4)

## 2024-03-29 LAB — PROTIME-INR
INR: 0.9 (ref 0.8–1.2)
Prothrombin Time: 13 s (ref 11.4–15.2)

## 2024-03-29 LAB — TROPONIN T, HIGH SENSITIVITY
Troponin T High Sensitivity: 27 ng/L — ABNORMAL HIGH (ref 0–19)
Troponin T High Sensitivity: 27 ng/L — ABNORMAL HIGH (ref 0–19)

## 2024-03-29 LAB — PRO BRAIN NATRIURETIC PEPTIDE: Pro Brain Natriuretic Peptide: 568 pg/mL — ABNORMAL HIGH

## 2024-03-29 LAB — TSH: TSH: 2.76 u[IU]/mL (ref 0.350–4.500)

## 2024-03-29 MED ORDER — LACTATED RINGERS IV BOLUS
1000.0000 mL | Freq: Once | INTRAVENOUS | Status: AC
  Administered 2024-03-29: 1000 mL via INTRAVENOUS

## 2024-03-29 MED ORDER — PRAVASTATIN SODIUM 10 MG PO TABS
20.0000 mg | ORAL_TABLET | Freq: Every day | ORAL | Status: DC
  Administered 2024-03-29: 20 mg via ORAL
  Filled 2024-03-29: qty 2

## 2024-03-29 MED ORDER — SODIUM CHLORIDE 0.9 % IV SOLN: INTRAVENOUS | Status: DC

## 2024-03-29 MED ORDER — SODIUM CHLORIDE 0.9% FLUSH
3.0000 mL | Freq: Two times a day (BID) | INTRAVENOUS | Status: DC
  Administered 2024-03-29: 3 mL via INTRAVENOUS

## 2024-03-29 MED ORDER — APIXABAN 5 MG PO TABS
5.0000 mg | ORAL_TABLET | Freq: Two times a day (BID) | ORAL | Status: DC
  Administered 2024-03-29: 5 mg via ORAL
  Filled 2024-03-29: qty 1

## 2024-03-29 MED ORDER — ONDANSETRON HCL 4 MG/2ML IJ SOLN: 4.0000 mg | Freq: Four times a day (QID) | INTRAMUSCULAR | Status: DC | PRN

## 2024-03-29 MED ORDER — TRAZODONE HCL 50 MG PO TABS: 25.0000 mg | ORAL_TABLET | Freq: Every evening | ORAL | Status: DC | PRN

## 2024-03-29 MED ORDER — DILTIAZEM HCL ER COATED BEADS 240 MG PO CP24
240.0000 mg | ORAL_CAPSULE | Freq: Once | ORAL | Status: AC
  Administered 2024-03-29: 240 mg via ORAL
  Filled 2024-03-29: qty 2
  Filled 2024-03-29: qty 1

## 2024-03-29 MED ORDER — DILTIAZEM HCL-DEXTROSE 125-5 MG/125ML-% IV SOLN (PREMIX)
5.0000 mg/h | INTRAVENOUS | Status: DC
  Administered 2024-03-29: 5 mg/h via INTRAVENOUS
  Filled 2024-03-29: qty 125

## 2024-03-29 MED ORDER — HYDRALAZINE HCL 20 MG/ML IJ SOLN: 10.0000 mg | INTRAMUSCULAR | Status: DC | PRN

## 2024-03-29 MED ORDER — DILTIAZEM HCL ER COATED BEADS 180 MG PO CP24
360.0000 mg | ORAL_CAPSULE | Freq: Once | ORAL | Status: DC
  Filled 2024-03-29 (×2): qty 2

## 2024-03-29 MED ORDER — ASPIRIN 81 MG PO TBEC
81.0000 mg | DELAYED_RELEASE_TABLET | Freq: Every day | ORAL | Status: DC
  Administered 2024-03-29: 81 mg via ORAL
  Filled 2024-03-29: qty 1

## 2024-03-29 MED ORDER — HYDROMORPHONE HCL 1 MG/ML IJ SOLN: 0.5000 mg | INTRAMUSCULAR | Status: DC | PRN

## 2024-03-29 MED ORDER — ONDANSETRON HCL 4 MG PO TABS: 4.0000 mg | ORAL_TABLET | Freq: Four times a day (QID) | ORAL | Status: DC | PRN

## 2024-03-29 MED ORDER — LOSARTAN POTASSIUM 50 MG PO TABS: 50.0000 mg | ORAL_TABLET | Freq: Every day | ORAL | Status: DC

## 2024-03-29 MED ORDER — HEPARIN SODIUM (PORCINE) 5000 UNIT/ML IJ SOLN: 5000.0000 [IU] | Freq: Three times a day (TID) | INTRAMUSCULAR | Status: DC

## 2024-03-29 MED ORDER — SENNOSIDES-DOCUSATE SODIUM 8.6-50 MG PO TABS: 1.0000 | ORAL_TABLET | Freq: Every evening | ORAL | Status: DC | PRN

## 2024-03-29 MED ORDER — FLEET ENEMA RE ENEM: 1.0000 | ENEMA | Freq: Once | RECTAL | Status: DC | PRN

## 2024-03-29 MED ORDER — DILTIAZEM LOAD VIA INFUSION
20.0000 mg | Freq: Once | INTRAVENOUS | Status: AC
  Administered 2024-03-29: 20 mg via INTRAVENOUS
  Filled 2024-03-29: qty 20

## 2024-03-29 MED ORDER — ACETAMINOPHEN 650 MG RE SUPP: 650.0000 mg | Freq: Four times a day (QID) | RECTAL | Status: DC | PRN

## 2024-03-29 MED ORDER — OXYCODONE HCL 5 MG PO TABS: 5.0000 mg | ORAL_TABLET | ORAL | Status: DC | PRN

## 2024-03-29 MED ORDER — RIVAROXABAN 15 MG PO TABS
15.0000 mg | ORAL_TABLET | Freq: Every day | ORAL | 0 refills | Status: AC
  Filled 2024-03-29: qty 30, 30d supply, fill #0

## 2024-03-29 MED ORDER — IPRATROPIUM BROMIDE 0.02 % IN SOLN: 0.5000 mg | Freq: Four times a day (QID) | RESPIRATORY_TRACT | Status: DC | PRN

## 2024-03-29 MED ORDER — APIXABAN 5 MG PO TABS: 5.0000 mg | ORAL_TABLET | Freq: Two times a day (BID) | ORAL | 3 refills | Status: DC

## 2024-03-29 MED ORDER — ACETAMINOPHEN 325 MG PO TABS: 650.0000 mg | ORAL_TABLET | Freq: Four times a day (QID) | ORAL | Status: DC | PRN

## 2024-03-29 NOTE — Assessment & Plan Note (Addendum)
-   Elevated BP, currently on Cardizem  drip - Home medication includes p.o. Cardizem , losartan  - Restarting losartan  at 50 mg p.o. daily - Monitoring BP closely on Cardizem  drip

## 2024-03-29 NOTE — Discharge Summary (Addendum)
 " Physician Discharge Summary   Patient: [name] MRN: [id number] DOB: 02-Aug-1940  Admit date:     03/29/2024  Discharge date: 03/29/24  Discharge Physician: Adriana DELENA Grams   PCP: Rollene Almarie DELENA, MD   Recommendations at discharge:   Follow-up with your primary cardiologist to soon as possible Follow-up with your PCP in 2-4 weeks Start Xarelto  as directed, continue p.o. Cardizem  as directed by cardiology  Discharge Diagnoses: Principal Problem:   Atrial fibrillation with rapid ventricular response (HCC) Active Problems:   Essential hypertension   Hyperlipidemia associated with type 2 diabetes mellitus (HCC)   Chest pain   PAF (paroxysmal atrial fibrillation) (HCC)   Stage 3b chronic kidney disease (HCC)   Type 2 diabetes mellitus with hyperlipidemia (HCC)   Gouty arthritis of left foot   SLEEP APNEA, OBSTRUCTIVE   Fibromyalgia   Chronic back pain  Resolved Problems:   * No resolved hospital problems. *  Hospital Course: KERISSA Cooper is a 84-year-old female with extensive history of paroxysmal A-fib, HTN, HLD, DM2, asthma, gout, OSA, remote PE in 1990s, CKD stage IIIb, chronic pain, for myalgia... Presented last night with palpitation, shortness of breath.. Her symptoms woke her up from sleep in the middle of the night with sudden palpitation, shortness of breath. On arrival rated pain 5 out of 10, with radiation to the back.  She took her diltiazem , aspirin  where she had some improvement.   ED Evaluation POA: HR RN 20s, was started on a Cardizem  drip Blood pressure 136/74, pulse 69, temperature 97.8 F (36.6 C), temperature source Oral, resp. rate 15, height 5' 3 (1.6 m), weight 84 kg, last menstrual period 12/30/2000, SpO2 96%.  LABs: Glucose 113, BUN 31, creatinine 1.32, GFR 40, troponin 27, 27, CBC within normal limits,   Patient has been started on Cardizem  drip.  Cardiology requested [facility] hospitalist to admit.     * Atrial fibrillation with rapid  ventricular response (HCC) POA:  heart rate> 120, -  started on Cardizem  drip --transition to p.o. as patient converted to normal sinus rhythm -Cardiology consulted-cleared to be discharged home  -Continue to titrate for heart rate 90-100  _Chad Vascor > 5.0  Pros and cons of anticoagulation including risk of stroke discussed. (Did not want Coumadin  -could not tolerate in the past) Patient agreed to start Xarelto  Discussed with pharmacy, Xarelto  was renally dosed 50 mg daily Patient is agreeable TOC pharmacy delivered the medication.  - Echo: 03/27/2024: EF 60-65%, normal LV function, grade 1 diastolic dysfunction, negative structural or valvular abnormalities, with exception of moderate aortic calcification, mild regurgitation, stenosis  - Patient converted to normal sinus rhythm, Cardizem  drip was discontinued, oral Cardizem  home dose was restarted - Patient is to continue Xarelto  as directed  Stage 3b chronic kidney disease (HCC) Monitor BUN/creatinine closely - Currently BUN 31, creatinine 1.32, GFR of 40   PAF (paroxysmal atrial fibrillation) (HCC) Now persistent A-fib Per cardiology resume same dose p.o. diltiazem  -Eliquis  initiated   Chest pain Chest pain resolved Chest pain the setting of A-fib with RVR -Mildly elevated troponin remained flat at 27, 27   - Echo reviewed EF 60-65%, grade 1 diastolic dysfunction with minimal structural abnormalities   Hyperlipidemia associated with type 2 diabetes mellitus (HCC) Continue pravastatin   Essential hypertension - BP stable off Cardizem  drip, continue home medication including Cardizem , losartan ,  Gouty arthritis of left foot Continue colchicine  as needed Not on allopurinol  Type 2 diabetes mellitus with hyperlipidemia (HCC) Last A1c 6.4 -  Currently patient is not on any medication - Diet controlled  Chronic back pain Chronic back pain with a history of fibromyalgia -Continue as needed analgesics-Home medication  include tramadol    Fibromyalgia Stable continue as needed analgesics  SLEEP APNEA, OBSTRUCTIVE Monitoring continue supplemental oxygen    Consultants: Cardiology Procedures performed: None Disposition: Home Diet recommendation:  Discharge Diet Orders (From admission, onward)     Start     Ordered   03/29/24 0000  Diet Carb Modified        03/29/24 1709           Cardiac and Carb modified diet DISCHARGE MEDICATION: Allergies as of 03/29/2024       Reactions   Benazepril  Cough   Lopressor  [metoprolol ] Other (See Comments)   nervousness   Adhesive [tape] Other (See Comments)   redness   Morphine  Other (See Comments)      Makes you feel bad all over   Nifedipine Hives, Itching   Piroxicam Hives, Itching        Medication List     STOP taking these medications    aspirin  EC 81 MG tablet       TAKE these medications    albuterol  108 (90 Base) MCG/ACT inhaler Commonly known as: VENTOLIN  HFA Inhale 2 puffs into the lungs every 6 (six) hours as needed for wheezing or shortness of breath.   diltiazem  120 MG 24 hr capsule Commonly known as: CARDIZEM  CD TAKE 1 CAP DAILY W/DILTIAZEM  240MG  TO EQUAL 360MG /DAY. MAY TAKE 1 EXTRA CAP AS NEEDED FOR BREAK THRU AFIB   diltiazem  240 MG 24 hr capsule Commonly known as: CARDIZEM  CD TAKE 1 CAPSULE EVERY DAY IN ADDITION TO DILTIAZEM  120MG  FOR TOTAL OF 360MG  DAILY   losartan  100 MG tablet Commonly known as: COZAAR  TAKE 1 TABLET EVERY DAY   ondansetron  4 MG tablet Commonly known as: ZOFRAN  Take 1 tablet (4 mg total) by mouth every 8 (eight) hours as needed for nausea or vomiting.   pravastatin  20 MG tablet Commonly known as: PRAVACHOL  Take 1 tablet (20 mg total) by mouth daily.   sodium chloride  0.65 % Soln nasal spray Commonly known as: OCEAN Place 1 spray into both nostrils as needed for congestion.   traMADol  50 MG tablet Commonly known as: ULTRAM  Take 1 tablet (50 mg total) by mouth daily as needed.    Xarelto  15 MG Tabs tablet Generic drug: Rivaroxaban  Take 1 tablet (15 mg total) by mouth daily. Start taking on: March 30, 2024         Follow-up Information     Schedule an appointment as soon as possible for a visit  with Connect with your PCP/Specialist as discussed.   Contact information: [email or web address] Call our physician referral line at [phone].        Schedule an appointment as soon as possible for a visit  with [facility].   Contact information: [address], [address] [place]  [address] [phone]               Discharge Exam: Fredricka Weights   03/29/24 0511  Weight: 84 kg        General:  AAO x 3,  cooperative, no distress;   HEENT:  Normocephalic, PERRL, otherwise with in Normal limits   Neuro:  CNII-XII intact. , normal motor and sensation, reflexes intact   Lungs:   Clear to auscultation BL, Respirations unlabored,  No wheezes / crackles  Cardio:    S1/S2, RRR, No  murmure, No Rubs or Gallops   Abdomen:  Soft, non-tender, bowel sounds active all four quadrants, no guarding or peritoneal signs.  Muscular  skeletal:  Limited exam -global generalized weaknesses - in bed, able to move all 4 extremities,   2+ pulses,  symmetric, No pitting edema  Skin:  Dry, warm to touch, negative for any Rashes,  Wounds: Please see nursing documentation          Condition at discharge: good  The results of significant diagnostics from this hospitalization (including imaging, microbiology, ancillary and laboratory) are listed below for reference.   Imaging Studies: DG Chest Portable 1 View Result Date: 03/29/2024 CLINICAL DATA:  Shortness of breath and chest pain. EXAM: PORTABLE CHEST 1 VIEW COMPARISON:  10/11/2023 FINDINGS: The lungs are clear without focal pneumonia, edema, pneumothorax or pleural effusion. Cardiopericardial silhouette is at upper limits of normal for size. No acute  bony abnormality. Telemetry leads overlie the chest. IMPRESSION: No active disease. Electronically Signed   By: Camellia Candle M.D.   On: 03/29/2024 05:46   ECHOCARDIOGRAM COMPLETE Result Date: 03/27/2024    ECHOCARDIOGRAM REPORT   Patient Name:   ROSALIA MCAVOY Date of Exam: 03/27/2024 Medical Rec #:  [id number]      Height:       63.0 in Accession #:    [id number]     Weight:       186.0 lb Date of Birth:  01-15-1941      BSA:          1.875 m Patient Age:    83 years       BP:           152/85 mmHg Patient Gender: F              HR:           70 bpm. Exam Location:  [address] Procedure: 2D Echo, 3D Echo, Cardiac Doppler, Color Doppler and Strain Analysis            (Both Spectral and Color Flow Doppler were utilized during            procedure). Indications:    R07.9 Chest Pain  History:        Patient has prior history of Echocardiogram examinations. CKD,                 Arrythmias:Atrial Fibrillation; Risk Factors:Diabetes,                 Hypertension and Sleep Apnea.  Sonographer:    Waldo Guadalajara RCS Referring Phys: [id number] PAULA V ROSS IMPRESSIONS  1. Left ventricular ejection fraction, by estimation, is 60 to 65%. The left ventricle has normal function. The left ventricle has no regional wall motion abnormalities. Left ventricular diastolic parameters are consistent with Grade I diastolic dysfunction (impaired relaxation). The average left ventricular global longitudinal strain is -17.1 %. The global longitudinal strain is normal.  2. Right ventricular systolic function is normal. The right ventricular size is normal. There is normal pulmonary artery systolic pressure.  3. Left atrial size was mildly dilated.  4. Right atrial size was mild to moderately dilated.  5. The mitral valve is abnormal. Trivial mitral valve regurgitation. No evidence of mitral stenosis.  6. The aortic valve was not well visualized. There is moderate calcification of the aortic valve. Aortic valve regurgitation is mild. Mild aortic  valve stenosis. Aortic regurgitation PHT measures 408 msec. Aortic valve area, by VTI measures 1.59 cm.  Aortic valve mean gradient measures 11.0 mmHg. Aortic valve Vmax measures 2.09 m/s.  7. Aortic dilatation noted. There is borderline dilatation of the ascending aorta, measuring 38 mm.  8. The inferior vena cava is normal in size with greater than 50% respiratory variability, suggesting right atrial pressure of 3 mmHg. FINDINGS  Left Ventricle: Left ventricular ejection fraction, by estimation, is 60 to 65%. The left ventricle has normal function. The left ventricle has no regional wall motion abnormalities. The average left ventricular global longitudinal strain is -17.1 %. Strain was performed and the global longitudinal strain is normal. The left ventricular internal cavity size was normal in size. There is no left ventricular hypertrophy. Left ventricular diastolic parameters are consistent with Grade I diastolic dysfunction (impaired relaxation). Right Ventricle: The right ventricular size is normal. No increase in right ventricular wall thickness. Right ventricular systolic function is normal. There is normal pulmonary artery systolic pressure. The tricuspid regurgitant velocity is 2.43 m/s, and  with an assumed right atrial pressure of 3 mmHg, the estimated right ventricular systolic pressure is 26.6 mmHg. Left Atrium: Left atrial size was mildly dilated. Right Atrium: Right atrial size was mild to moderately dilated. Pericardium: There is no evidence of pericardial effusion. Mitral Valve: The mitral valve is abnormal. There is mild thickening of the mitral valve leaflet(s). There is mild calcification of the mitral valve leaflet(s). Mild mitral annular calcification. Trivial mitral valve regurgitation. No evidence of mitral valve stenosis. Tricuspid Valve: The tricuspid valve is normal in structure. Tricuspid valve regurgitation is mild . No evidence of tricuspid stenosis. Aortic Valve: The aortic valve  was not well visualized. There is moderate calcification of the aortic valve. Aortic valve regurgitation is mild. Aortic regurgitation PHT measures 408 msec. Mild aortic stenosis is present. Aortic valve mean gradient measures 11.0 mmHg. Aortic valve peak gradient measures 17.5 mmHg. Aortic valve area, by VTI measures 1.59 cm. Pulmonic Valve: The pulmonic valve was normal in structure. Pulmonic valve regurgitation is trivial. No evidence of pulmonic stenosis. Aorta: Aortic dilatation noted. There is borderline dilatation of the ascending aorta, measuring 38 mm. Venous: The inferior vena cava is normal in size with greater than 50% respiratory variability, suggesting right atrial pressure of 3 mmHg. IAS/Shunts: The interatrial septum is aneurysmal. No atrial level shunt detected by color flow Doppler. Additional Comments: 3D was performed not requiring image post processing on an independent workstation and was normal.  LEFT VENTRICLE PLAX 2D LVIDd:         3.90 cm   Diastology LVIDs:         2.90 cm   LV e' medial:    3.92 cm/s LV PW:         0.80 cm   LV E/e' medial:  14.2 LV IVS:        0.90 cm   LV e' lateral:   5.33 cm/s LVOT diam:     1.80 cm   LV E/e' lateral: 10.4 LV SV:         81 LV SV Index:   43        2D Longitudinal Strain LVOT Area:     2.54 cm  2D Strain GLS (A4C):   -19.0 % LV IVRT:       137 msec  2D Strain GLS (A3C):   -15.9 %                          2D Strain GLS (A2C):   -  16.3 %                          2D Strain GLS Avg:     -17.1 %                           3D Volume EF:                          3D EF:        64 %                          LV EDV:       85 ml                          LV ESV:       31 ml                          LV SV:        54 ml RIGHT VENTRICLE RV Basal diam:  3.80 cm     PULMONARY VEINS RV S prime:     11.30 cm/s  A Reversal Velocity: 36.70 cm/s TAPSE (M-mode): 2.1 cm      Diastolic Velocity:  36.20 cm/s RVSP:           26.6 mmHg   S/D Velocity:        1.30                              Systolic Velocity:   48.30 cm/s LEFT ATRIUM             Index        RIGHT ATRIUM           Index LA diam:        3.20 cm 1.71 cm/m   RA Pressure: 3.00 mmHg LA Vol (A2C):   53.6 ml 28.59 ml/m  RA Area:     20.90 cm LA Vol (A4C):   54.6 ml 29.12 ml/m  RA Volume:   65.40 ml  34.88 ml/m LA Biplane Vol: 58.9 ml 31.41 ml/m  AORTIC VALVE AV Area (Vmax):    1.56 cm AV Area (Vmean):   1.44 cm AV Area (VTI):     1.59 cm AV Vmax:           209.00 cm/s AV Vmean:          161.000 cm/s AV VTI:            0.507 m AV Peak Grad:      17.5 mmHg AV Mean Grad:      11.0 mmHg LVOT Vmax:         128.00 cm/s LVOT Vmean:        91.000 cm/s LVOT VTI:          0.317 m LVOT/AV VTI ratio: 0.63 AI PHT:            408 msec  AORTA Ao Root diam: 3.30 cm Ao Asc diam:  3.80 cm MITRAL VALVE                TRICUSPID VALVE MV Area (PHT):  TR Peak grad:   23.6 mmHg MV Decel Time:              TR Vmax:        243.00 cm/s MV E velocity: 55.50 cm/s   Estimated RAP:  3.00 mmHg MV A velocity: 123.00 cm/s  RVSP:           26.6 mmHg MV E/A ratio:  0.45                             SHUNTS                             Systemic VTI:  0.32 m                             Systemic Diam: 1.80 cm Toribio Fuel MD Electronically signed by Toribio Fuel MD Signature Date/Time: 03/27/2024/7:19:13 PM    Final     Microbiology: Results for orders placed or performed during the hospital encounter of 10/21/22  Surgical pcr screen     Status: None   Collection Time: 10/21/22 11:00 AM   Specimen: Nasal Mucosa; Nasal Swab  Result Value Ref Range Status   MRSA, PCR NEGATIVE NEGATIVE Final   Staphylococcus aureus NEGATIVE NEGATIVE Final    Comment: (NOTE) The Xpert SA Assay (FDA approved for NASAL specimens in patients 87 years of age and older), is one component of a comprehensive surveillance program. It is not intended to diagnose infection nor to guide or monitor treatment. Performed at [facility], [address], [place], [place] [address]     Labs: CBC: Recent Labs  Lab 03/29/24 0518  WBC 7.9  NEUTROABS 4.0  HGB 14.5  HCT 42.1  MCV 89.8  PLT 191   Basic Metabolic Panel: Recent Labs  Lab 03/29/24 0518  NA 140  K 3.9  CL 104  CO2 23  GLUCOSE 113*  BUN 31*  CREATININE 1.32*  CALCIUM  9.9  MG 2.2   Liver Function Tests: Recent Labs  Lab 03/29/24 0518  AST 22  ALT 11  ALKPHOS 104  BILITOT 0.5  PROT 6.9  ALBUMIN 4.4   CBG: No results for input(s): GLUCAP in the last 168 hours.  Discharge time spent: greater than 30 minutes.  Signed: Adriana DELENA Grams, MD [facility] 03/29/2024 "

## 2024-03-29 NOTE — Assessment & Plan Note (Signed)
 Continue pravastatin 

## 2024-03-29 NOTE — ED Notes (Signed)
 Pt requesting to be discharged now that she had converted back into normal sinus rhythm. Shahmehdi MD notified.

## 2024-03-29 NOTE — Assessment & Plan Note (Signed)
 Monitoring continue supplemental oxygen

## 2024-03-29 NOTE — ED Notes (Signed)
 Hospitalist at bedside. Cardiology contacted via secure chat to notify of pt request to be discharged per hospitalist.

## 2024-03-29 NOTE — ED Provider Notes (Signed)
 " [place] [facility] Provider Note   CSN: [id number] Arrival date & time: 03/29/24  0510     History Chief Complaint  Patient presents with   Palpitations    HPI [name] is a 84 y.o. female presenting for chief complaint of palpitations.  She is an 84 year old female.  States that she has been having on and off palpitations over the past few weeks.  History of A-fib but does not typically go above 100.  Tonight she noticed some chest pain and had initial heart rate of 180 with EMS.  They treated with Cardizem  IV with improvement of chest pain and heart rate. States that she is grossly improved since their arrival.  Denies recent fevers chills nausea vomiting syncope shortness of breath.   Patient's recorded medical, surgical, social, medication list and allergies were reviewed in the Snapshot window as part of the initial history.   Review of Systems   Review of Systems  Constitutional:  Negative for chills and fever.  HENT:  Negative for ear pain and sore throat.   Eyes:  Negative for pain and visual disturbance.  Respiratory:  Negative for cough and shortness of breath.   Cardiovascular:  Positive for chest pain. Negative for palpitations.  Gastrointestinal:  Negative for abdominal pain and vomiting.  Genitourinary:  Negative for dysuria and hematuria.  Musculoskeletal:  Negative for arthralgias and back pain.  Skin:  Negative for color change and rash.  Neurological:  Negative for seizures and syncope.  All other systems reviewed and are negative.   Physical Exam Updated Vital Signs BP 111/76   Pulse 71   Temp 98 F (36.7 C) (Oral)   Resp 18   Ht 5' 3 (1.6 m)   Wt 84 kg   LMP 12/30/2000   SpO2 99%   BMI 32.80 kg/m  Physical Exam Constitutional:      General: She is not in acute distress.    Appearance: She is not ill-appearing or toxic-appearing.  HENT:     Head: Normocephalic and atraumatic.  Eyes:     Extraocular  Movements: Extraocular movements intact.     Pupils: Pupils are equal, round, and reactive to light.  Cardiovascular:     Rate and Rhythm: Normal rate.  Pulmonary:     Effort: No respiratory distress.  Abdominal:     General: Abdomen is flat.  Musculoskeletal:        General: No swelling, deformity or signs of injury.     Cervical back: Normal range of motion. No rigidity.  Skin:    General: Skin is warm and dry.  Neurological:     General: No focal deficit present.     Mental Status: She is alert and oriented to person, place, and time.  Psychiatric:        Mood and Affect: Mood normal.      ED Course/ Medical Decision Making/ A&P    Procedures Procedures   Medications Ordered in ED Medications  diltiazem  (CARDIZEM ) 1 mg/mL load via infusion 20 mg (20 mg Intravenous Bolus from Bag 03/29/24 0540)    And  diltiazem  (CARDIZEM ) 125 mg in dextrose  5% 125 mL (1 mg/mL) infusion (5 mg/hr Intravenous New Bag/Given 03/29/24 0540)  lactated ringers  bolus 1,000 mL (1,000 mLs Intravenous New Bag/Given 03/29/24 0538)    Medical Decision Making:   [name] is a 84 y.o. female who presented to the ED today with palpitations and near syncope as well  as chest pain detailed above.    Handoff received from EMS.  Patient placed on continuous vitals and telemetry monitoring while in ED which was reviewed periodically.  Complete initial physical exam performed, notably the patient  was hemodynamically stable no acute distress.  Initially grossly tachycardic with EMS though now improving.    Reviewed and confirmed nursing documentation for past medical history, family history, social history.    Initial Assessment:   84 year old female with substantial palpitations as above. In A-fib RVR with EMS.  Appropriately responding to diltiazem  but still remains in A-fib RVR.  Lab work was checked without obvious metabolic etiology, x-ray chest without infectious etiology detected.  Patient responding  to diltiazem  infusion.  Consulted cardiology is coming to evaluate patient this morning provide recommendations on next steps. Patient not a good candidate for cardioversion in ER due to noncompliance on anticoagulation and palpitation symptoms over the past few weeks intermittently. EKG and initial troponin consistent with baseline, serial troponin still pending. Care of patient handed off to oncoming team pending recommendations from cardiology team.  Clinical Impression:  1. Palpitations   2. Atrial fibrillation with RVR (HCC)      Admit   Final Clinical Impression(s) / ED Diagnoses Final diagnoses:  Palpitations  Atrial fibrillation with RVR [facility])    Rx / DC Orders ED Discharge Orders     None         Jerral Meth, MD 03/29/24 [phone]  "

## 2024-03-29 NOTE — TOC CM/SW Note (Signed)
 TOC consult received for possible d/c planning needs. D/c order in. No current d/c needs reported. Plan for patient to d/c home.    Merilee Batty, MSN, RN Case Management [phone]

## 2024-03-29 NOTE — Hospital Course (Signed)
 [name] is a 84-year-old female with extensive history of paroxysmal A-fib, HTN, HLD, DM2, asthma, gout, OSA, remote PE in 1990s, CKD stage IIIb, chronic pain, for myalgia... Presented last night with palpitation, shortness of breath.. Her symptoms woke her up from sleep in the middle of the night with sudden palpitation, shortness of breath. On arrival rated pain 5 out of 10, with radiation to the back.  She took her diltiazem , aspirin  where she had some improvement.   ED Evaluation POA: HR RN 20s, was started on a Cardizem  drip Blood pressure 136/74, pulse 69, temperature 97.8 F (36.6 C), temperature source Oral, resp. rate 15, height 5' 3 (1.6 m), weight 84 kg, last menstrual period 12/30/2000, SpO2 96%.  LABs: Glucose 113, BUN 31, creatinine 1.32, GFR 40, troponin 27, 27, CBC within normal limits,   Patient has been started on Cardizem  drip.  Cardiology requested [facility] hospitalist to admit.

## 2024-03-29 NOTE — Assessment & Plan Note (Signed)
 Continue colchicine  as needed Not on allopurinol

## 2024-03-29 NOTE — Assessment & Plan Note (Signed)
-   Stable continue as needed analgesics ?

## 2024-03-29 NOTE — Assessment & Plan Note (Addendum)
 POA:  heart rate> 120, started on Cardizem  drip -Cardiology consulted - Holding home medications of p.o. Cardizem  -Continue to titrate for heart rate 90-100 -Apparently patient has refused anticoagulation in the past, currently not on heparin  drip or oral anticoagulation -appreciate cardiology input _Chad Vascor > 5.0  Pros and cons of anticoagulation including risk of stroke discussed. (Did not want Coumadin  -could not tolerate in the past) Patient agreed to start on Eliquis    - Echo: 03/27/2024: EF 60-65%, normal LV function, grade 1 diastolic dysfunction, negative structural or valvular abnormalities, with exception of moderate aortic calcification, mild regurgitation, stenosis  - Will continue aspirin , statins,

## 2024-03-29 NOTE — ED Triage Notes (Signed)
 Patient BIB [facility] from home due palpitation and SOB. Patient states at 3:41 am she woke up w/ sudden onset palpitation and SOB. Patient states she had chest discomfort 5/10 that radiated to her back. Patient took 120mg  PO cardizem  and 324 aspirin  prior to EMS arrival. En route HR 140-180 to which EMS gave a total of 20mg  cardizem  IV which brought HR between 70-120. Hx of afib. VSS en route.

## 2024-03-29 NOTE — Assessment & Plan Note (Signed)
 Chronic back pain with a history of fibromyalgia -Continue as needed analgesics-Home medication include tramadol 

## 2024-03-29 NOTE — Consult Note (Addendum)
 "  Cardiology Consultation  Patient ID: SYLIVA MEE MRN: [id number]; DOB: [birth year]  Admit date: 03/29/2024 Date of Consult: 03/29/2024  PCP:  Rollene Almarie LABOR, MD   [facility] Providers Cardiologist:  Newman JINNY Lawrence, MD     Patient Profile: [name] is a 84 y.o. female with a hx of paroxysmal atrial fibrillation previously refused anticoagulation, hypertension, hyperlipidemia, type 2 diabetes, asthma, gout, OSA, remote history of PE in 1990s, CKD stage IIIb, chronic pain, fibromyalgia, who is being seen 03/29/2024 for the evaluation of A-Fib RVR at the request of Dr. Jerral.  History of Present Illness: [name] has past medical history as listed above.  She presented to the [facility] emergency department via EMS on 03/29/2024 due to shortness of breath and palpitations.  She reports that she woke up around 3:41 AM with sudden onset of palpitations and shortness of breath.  She also reported associated chest discomfort that she rated a 5 out of 10 and reported it radiated to her back.  She took oral diltiazem  120 mg and 324 mg of aspirin  prior to EMS arrival.  She was noted to be in A-fib RVR with HR 140s to 180s, given a total of 20 mg of IV diltiazem  which reduced her heart rate to 70-120.  Relevant workup while in the ER includes: Troponin mildly elevated and flat 27 ? 27, CBC normal, CMP showed stable CKD with creatinine at 1.32 (appears around baseline), K 3.9, Mag 2.2. CXR showed no active disease. EKG showed A-Fib with HR 112.   Since being in the ER, she has been started on IV diltiazem  running at 5 mg/hr, she is also been given 1 L of LR.  Recent outpatient echo from 03/2024: LVEF 60 to 65%, no RWMA, G1 DD, normal RV systolic function, normal PASP, mild LAE, moderate RAE, mild AR, mild AS, VTI 1.59 cm, mean gradient 11 mmHg, borderline dilation of ascending aorta at 38 mm, normal IVC.  She is a patient of Dr. Okey, last seen 03/23/2024 for follow-up.  At  this appointment she was noted to be doing fairly well from a cardiac standpoint.  She was able to do household chores without any symptoms.  She was suffering from significant pain in her left hip and was planning for surgery recently.  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  She has repeatedly refused anticoagulation in the setting of her atrial fibrillation.  Dr. Okey again reiterated the importance and stroke risk, patient understands the risks and continued to refuse.  Her home medications include: Losartan  100 mg daily, pravastatin  20 mg daily, aspirin  81 mg daily, diltiazem  360 mg daily (in split doses so she can take 120 mg PRN for symptoms as well).  After speaking with the patient, she agrees with history as stated above.  She tells me that she is very in tune with her atrial fibrillation.  Notes that she typically only has episodes that last for a few hours, this is when she takes her extra dose of diltiazem  120 mg.  She tells me that normally her blood pressure is not increased during these episodes however for the 1 that led her to the hospital this time is because her SBP was in the 180-200 range, which frightened her.  She has been started on IV diltiazem , currently running at 5 mg an hour.  She remains in A-fib, rates are now in the 90s to 100 range.  She tells  me she feels slightly better however she is having a slight headache at this time.  Her most recent BP seems to be down to 115/89.  Past Medical History:  Diagnosis Date   Allergic rhinitis    Allergy    Anxiety    and panic attacks;pt states that she is claustrophobic   Asthma    Atrial fibrillation (HCC)    Used to take Coumadin -instructed to stop   Cataract    bilateral surgery to remove   Chronic back pain    hx buldging disc   Colon cancer (HCC) 2007   s/p surgery and chemo   Constipation    takes Colace prn   Diabetes mellitus    glimepiride    DJD (degenerative joint disease)     Dyspnea    Dysrhythmia    Fibromyalgia    but doesn't take any meds   Gastric ulcer    GERD (gastroesophageal reflux disease)    uses Tums prn   Gout    takes Colchicine  prn   Heart murmur    History of blood clots    right lung in early 90's   History of colon polyps    History of migraine headaches    last migraine 33yrs ago;Pt states she does get sinus headaches   History of PSVT (paroxysmal supraventricular tachycardia) no current problems   History of shingles    HLD (hyperlipidemia)    diet control - no meds   Hypertension    takes Micardis  and Diltiazem  daily   Osteoarthritis    Peptic ulcer disease    Peripheral edema    takes Furosemide  every other day   Sleep apnea    doesn't use CPAP   Past Surgical History:  Procedure Laterality Date   ANKLE SURGERY     tumor-benign removed left ankle   ANTERIOR CERVICAL DECOMP/DISCECTOMY FUSION  01/12/2011   Procedure: ANTERIOR CERVICAL DECOMPRESSION/DISCECTOMY FUSION 2 LEVELS;  Surgeon: Victory LABOR Pool;  Location: [facility];  Service: Neurosurgery;  Laterality: Bilateral;  Cervical five-six, six-seven anterior cervical discectomy and fusion with allograft and plating   ANTERIOR CERVICAL DECOMP/DISCECTOMY FUSION  06/11/2011   Procedure: ANTERIOR CERVICAL DECOMPRESSION/DISCECTOMY FUSION 1 LEVEL/HARDWARE REMOVAL;  Surgeon: Victory LABOR Gunnels, MD;  Location: [facility];  Service: Neurosurgery;  Laterality: N/A;  Cervical four-five anterior cervical decompression fusion with allograft and plating; Removal of Cervical five to seven plate   bilateral cataracts removed     caesarean section  1966/69/72   x 3   CARDIAC CATHETERIZATION  90's and 2005   CARPAL TUNNEL RELEASE Left 11/2013   cataract surgery  2010   COLECTOMY  2007   colon cancer   COLONOSCOPY     DILATION AND CURETTAGE OF UTERUS  02-19-11   & POLYP REMOVAL   GANGLION CYST EXCISION  > 44yrs ago   removed from left pointer finger   HEEL SPUR SURGERY Right 1992   x 2    HYSTEROSCOPY WITH D & C  02/19/2011   Procedure: DILATATION AND CURETTAGE /HYSTEROSCOPY;  Surgeon: Evalene SHAUNNA Organ, MD;  Location: [facility];  Service: Gynecology;  Laterality: N/A;  requests one hour   JOINT REPLACEMENT Left    knee   knee arthrosocpy     bil;couple of years before knee replacement   KNEE SURGERY     left TKA   LEFT HEART CATH AND CORONARY ANGIOGRAPHY N/A 04/21/2020   Procedure: LEFT HEART CATH AND CORONARY ANGIOGRAPHY;  Surgeon: Court Carrier  J, MD;  Location: [facility];  Service: Cardiovascular;  Laterality: N/A;   LUMBAR DISC SURGERY  2008   port a cath placed  2008   port a cath removed  2008   pyelonidal cystectomy     at age 10   SHOULDER ARTHROSCOPY WITH SUBACROMIAL DECOMPRESSION AND OPEN ROTATOR C Right 03/16/2013   Procedure: RIGHT SHOULDER ARTHROSCOPY WITH SUBACROMIAL DECOMPRESSION AND OPEN ROTATOR CUFF REPAIR, OPEN DISTAL CLAVICLE  RESECTION POSSIBLE BICEP TENODESIS ;  Surgeon: Elspeth JONELLE Her, MD;  Location: [facility];  Service: Orthopedics;  Laterality: Right;   TOTAL KNEE ARTHROPLASTY Right 11/02/2022   Procedure: TOTAL KNEE ARTHROPLASTY;  Surgeon: Ernie Cough, MD;  Location: [facility];  Service: Orthopedics;  Laterality: Right;   TRIGGER FINGER RELEASE Left    thumb, tendonitis, tumor removed   TUBAL LIGATION  1972    Home Medications:  Prior to Admission medications  Medication Sig Start Date End Date Taking? Authorizing Provider  albuterol  (VENTOLIN  HFA) 108 (90 Base) MCG/ACT inhaler Inhale 2 puffs into the lungs every 6 (six) hours as needed for wheezing or shortness of breath. 07/05/22   Rollene Almarie LABOR, MD  aspirin  EC 81 MG tablet Take 81-325 mg by mouth daily as needed for moderate pain (pain score 4-6).    [provider]  Colchicine  (MITIGARE ) 0.6 MG CAPS Take 1 capsule (0.6 mg total) by mouth daily as needed (gout flare). Patient not taking: Reported on 03/23/2024 03/14/24   Rollene Almarie LABOR, MD  diltiazem  (CARDIZEM  CD) 120  MG 24 hr capsule TAKE 1 CAP DAILY W/DILTIAZEM  240MG  TO EQUAL 360MG /DAY. MAY TAKE 1 EXTRA CAP AS NEEDED FOR BREAK THRU AFIB 02/06/24   Patwardhan, Manish J, MD  diltiazem  (CARDIZEM  CD) 240 MG 24 hr capsule TAKE 1 CAPSULE EVERY DAY IN ADDITION TO DILTIAZEM  120MG  FOR TOTAL OF 360MG  DAILY 02/13/24   Okey [name], MD  docusate sodium  (COLACE) 100 MG capsule Take 100 mg by mouth 2 (two) times daily as needed for mild constipation.    [provider]  losartan  (COZAAR ) 100 MG tablet TAKE 1 TABLET EVERY DAY 01/06/24   Okey [name], MD  ondansetron  (ZOFRAN ) 4 MG tablet Take 1 tablet (4 mg total) by mouth every 8 (eight) hours as needed for nausea or vomiting. 12/26/23   Rollene Almarie LABOR, MD  pravastatin  (PRAVACHOL ) 20 MG tablet Take 1 tablet (20 mg total) by mouth daily. 01/04/24   Rollene Almarie LABOR, MD  sodium chloride  (OCEAN) 0.65 % SOLN nasal spray Place 1 spray into both nostrils as needed for congestion.    [provider]  traMADol  (ULTRAM ) 50 MG tablet Take 1 tablet (50 mg total) by mouth daily as needed. 12/26/23   Rollene Almarie LABOR, MD   Scheduled Meds:  Continuous Infusions:  diltiazem  (CARDIZEM ) infusion 5 mg/hr (03/29/24 0540)   PRN Meds:  Allergies:   Allergies[1]  Social History:   Social History   Socioeconomic History   Marital status: Married    Spouse name: Tanda   Number of children: 3   Years of education: Not on file   Highest education level: Not on file  Occupational History   Occupation: retired  Tobacco Use   Smoking status: Former    Current packs/day: 0.00    Average packs/day: 1.5 packs/day for 30.0 years (45.0 ttl pk-yrs)    Types: Cigarettes    Start date: 02/22/1961    Quit date: 02/23/1991    Years since quitting: 33.1  Smokeless tobacco: Never  Vaping Use   Vaping status: Never Used  Substance and Sexual Activity   Alcohol use: No    Alcohol/week: 0.0 standard drinks of alcohol   Drug use: No   Sexual activity: Yes     Partners: Male    Birth control/protection: Post-menopausal  Other Topics Concern   Not on file  Social History Narrative   Regular exercise- no.   Lives with husband/2025   Social Drivers of Health   Tobacco Use: Medium Risk (03/23/2024)   Patient History    Smoking Tobacco Use: Former    Smokeless Tobacco Use: Never    Passive Exposure: Not on Actuary Strain: Low Risk (07/12/2023)   Overall Financial Resource Strain (CARDIA)    Difficulty of Paying Living Expenses: Not hard at all  Food Insecurity: No Food Insecurity (07/12/2023)   Hunger Vital Sign    Worried About Running Out of Food in the Last Year: Never true    Ran Out of Food in the Last Year: Never true  Transportation Needs: No Transportation Needs (07/12/2023)   PRAPARE - Administrator, Civil Service (Medical): No    Lack of Transportation (Non-Medical): No  Physical Activity: Inactive (07/12/2023)   Exercise Vital Sign    Days of Exercise per Week: 0 days    Minutes of Exercise per Session: 0 min  Stress: No Stress Concern Present (07/12/2023)   [employer] of Occupational Health - Occupational Stress Questionnaire    Feeling of Stress : Only a little  Social Connections: Moderately Isolated (07/12/2023)   Social Connection and Isolation Panel    Frequency of Communication with Friends and Family: More than three times a week    Frequency of Social Gatherings with Friends and Family: Not on file    Attends Religious Services: Never    Active Member of Clubs or Organizations: No    Attends Banker Meetings: Never    Marital Status: Married  Catering Manager Violence: Not At Risk (07/12/2023)   Humiliation, Afraid, Rape, and Kick questionnaire    Fear of Current or Ex-Partner: No    Emotionally Abused: No    Physically Abused: No    Sexually Abused: No  Depression (PHQ2-9): Medium Risk (12/26/2023)   Depression (PHQ2-9)    PHQ-2 Score: 5  Alcohol Screen: Low Risk  (07/12/2023)   Alcohol Screen    Last Alcohol Screening Score (AUDIT): 0  Housing: Unknown (07/12/2023)   Housing Stability Vital Sign    Unable to Pay for Housing in the Last Year: No    Number of Times Moved in the Last Year: Not on file    Homeless in the Last Year: No  Utilities: Not At Risk (07/12/2023)   AHC Utilities    Threatened with loss of utilities: No  Health Literacy: Adequate Health Literacy (07/12/2023)   B1300 Health Literacy    Frequency of need for help with medical instructions: Never    Family History:   Family History  Problem Relation Age of Onset   Heart attack Mother    Stroke Mother    Hypertension Mother    Heart disease Father    Hypertension Sister    Kidney cancer Sister        left   Bladder Cancer Brother    Liver cancer Brother        spread to colon   Coronary artery disease Other    Anesthesia problems Neg Hx  Hypotension Neg Hx    Malignant hyperthermia Neg Hx    Pseudochol deficiency Neg Hx    Stomach cancer Neg Hx    Rectal cancer Neg Hx     ROS:  Please see the history of present illness.  All other ROS reviewed and negative.     Physical Exam/Data: Vitals:   03/29/24 0934 03/29/24 0935 03/29/24 1000 03/29/24 1110  BP: 118/67  115/89   Pulse: 64  (!) 113 (!) 106  Resp: 12  14 14   Temp:  97.9 F (36.6 C)    TempSrc:  Oral    SpO2: 97%  98% 95%  Weight:      Height:        Intake/Output Summary (Last 24 hours) at 03/29/2024 1122 Last data filed at 03/29/2024 0800 Gross per 24 hour  Intake 1000.73 ml  Output 1200 ml  Net -199.27 ml      03/29/2024    5:11 AM 03/23/2024    4:21 PM 12/26/2023    2:30 PM  Last 3 Weights  Weight (lbs) 185 lb 3 oz 186 lb 193 lb 3.2 oz  Weight (kg) 84 kg 84.369 kg 87.635 kg     Body mass index is 32.8 kg/m.   General:  Well nourished, well developed, in no acute distress HEENT: normal Neck: no JVD Vascular: Distal pulses 2+ bilaterally Cardiac: Irregularly irregular, mildly tachycardic,  + SEM Lungs:  clear to auscultation bilaterally Abd: soft, nontender Ext: no edema Musculoskeletal:  No deformities Skin: warm and dry  Neuro:  no focal abnormalities noted Psych:  Normal affect   EKG:  The EKG was personally reviewed and demonstrates: A-fib, HR 112  Telemetry:  Telemetry was personally reviewed and demonstrates: A-fib, HR 90s to 100s  Relevant CV Studies:  Echocardiogram, 03/27/2024 Left ventricular ejection fraction, by estimation, is 60 to 65% . The left ventricle has normal function. The left ventricle has no regional wall motion abnormalities. Left ventricular diastolic parameters are consistent with Grade I diastolic dysfunction ( impaired relaxation) . The average left ventricular global longitudinal strain is - 17. 1 % . The global longitudinal strain is normal.  Right ventricular systolic function is normal. The right ventricular size is normal. There is normal pulmonary artery systolic pressure.  Left atrial size was mildly dilated.  Right atrial size was mild to moderately dilated.  The mitral valve is abnormal. Trivial mitral valve regurgitation. No evidence of mitral stenosis.  The aortic valve was not well visualized. There is moderate calcification of the aortic valve. Aortic valve regurgitation is mild. Mild aortic valve stenosis. Aortic regurgitation PHT measures 408 msec. Aortic valve area, by VTI measures 1. 59 cm . Aortic valve mean gradient measures 11. 0 mmHg. Aortic valve Vmax measures 2. 09 m/ s.  Aortic dilatation noted. There is borderline dilatation of the ascending aorta, measuring 38 mm.  The inferior vena cava is normal in size with greater than 50% respiratory variability, suggesting right atrial pressure of 3 mmHg.  Long-term monitor, 12/28/2022 Dominant rhythm: Sinus. HR 53-111 bpm. Avg HR 75 bpm, in sinus rhythm 170 episodes of SVT/trial tachycardia, fastest at 245 bpm for 5 beats, longest for 1 min 3 secs at 112 bpm. <1% isolated SVE,  couplet/triplets. 1 episode of VT, at 133 bpm for 4 beats. <1% isolated VE, no couplet/triplets. No atrial fibrillation/atrial flutter/VT/high grade AV block, sinus pause >3sec noted. 2 patient triggered events, correlated with sinus rhythm.  Cardiac cath, 04/21/2020 [name] has essentially normal  coronary arteries angiographically normal LV function by 2D echo. I believe her chest pain and troponin leak were related to demand ischemia from her A. fib. Medical therapy will be pursued.   Laboratory Data: High Sensitivity Troponin:  No results for input(s): TROPONINIHS in the last 720 hours.  Recent Labs  Lab 03/29/24 0518 03/29/24 0733  TRNPT 27* 27*      Chemistry Recent Labs  Lab 03/29/24 0518  NA 140  K 3.9  CL 104  CO2 23  GLUCOSE 113*  BUN 31*  CREATININE 1.32*  CALCIUM  9.9  MG 2.2  GFRNONAA 40*  ANIONGAP 13    Recent Labs  Lab 03/29/24 0518  PROT 6.9  ALBUMIN 4.4  AST 22  ALT 11  ALKPHOS 104  BILITOT 0.5   Lipids No results for input(s): CHOL, TRIG, HDL, LABVLDL, LDLCALC, CHOLHDL in the last 168 hours.  Hematology Recent Labs  Lab 03/29/24 0518  WBC 7.9  RBC 4.69  HGB 14.5  HCT 42.1  MCV 89.8  MCH 30.9  MCHC 34.4  RDW 13.2  PLT 191   Thyroid  No results for input(s): TSH, FREET4 in the last 168 hours.  BNPNo results for input(s): BNP, PROBNP in the last 168 hours.  DDimer No results for input(s): DDIMER in the last 168 hours.  Radiology/Studies:  DG Chest Portable 1 View Result Date: 03/29/2024 CLINICAL DATA:  Shortness of breath and chest pain. EXAM: PORTABLE CHEST 1 VIEW COMPARISON:  10/11/2023 FINDINGS: The lungs are clear without focal pneumonia, edema, pneumothorax or pleural effusion. Cardiopericardial silhouette is at upper limits of normal for size. No acute bony abnormality. Telemetry leads overlie the chest. IMPRESSION: No active disease. Electronically Signed   By: Camellia Candle M.D.   On: 03/29/2024 05:46    ECHOCARDIOGRAM COMPLETE Result Date: 03/27/2024    ECHOCARDIOGRAM REPORT   Patient Name:   [name] Date of Exam: 03/27/2024 Medical Rec #:  [id number]      Height:       63.0 in Accession #:    [id number]     Weight:       186.0 lb Date of Birth:  [birth year]      BSA:          1.875 m Patient Age:    83 years       BP:           152/85 mmHg Patient Gender: F              HR:           70 bpm. Exam Location:  [address] Procedure: 2D Echo, 3D Echo, Cardiac Doppler, Color Doppler and Strain Analysis            (Both Spectral and Color Flow Doppler were utilized during            procedure). Indications:    R07.9 Chest Pain  History:        Patient has prior history of Echocardiogram examinations. CKD,                 Arrythmias:Atrial Fibrillation; Risk Factors:Diabetes,                 Hypertension and Sleep Apnea.  Sonographer:    Waldo Guadalajara RCS Referring Phys: [id number] PAULA V ROSS IMPRESSIONS  1. Left ventricular ejection fraction, by estimation, is 60 to 65%. The left ventricle has normal function. The left ventricle has no regional wall motion abnormalities. Left  ventricular diastolic parameters are consistent with Grade I diastolic dysfunction (impaired relaxation). The average left ventricular global longitudinal strain is -17.1 %. The global longitudinal strain is normal.  2. Right ventricular systolic function is normal. The right ventricular size is normal. There is normal pulmonary artery systolic pressure.  3. Left atrial size was mildly dilated.  4. Right atrial size was mild to moderately dilated.  5. The mitral valve is abnormal. Trivial mitral valve regurgitation. No evidence of mitral stenosis.  6. The aortic valve was not well visualized. There is moderate calcification of the aortic valve. Aortic valve regurgitation is mild. Mild aortic valve stenosis. Aortic regurgitation PHT measures 408 msec. Aortic valve area, by VTI measures 1.59 cm. Aortic valve mean gradient measures 11.0 mmHg.  Aortic valve Vmax measures 2.09 m/s.  7. Aortic dilatation noted. There is borderline dilatation of the ascending aorta, measuring 38 mm.  8. The inferior vena cava is normal in size with greater than 50% respiratory variability, suggesting right atrial pressure of 3 mmHg. FINDINGS  Left Ventricle: Left ventricular ejection fraction, by estimation, is 60 to 65%. The left ventricle has normal function. The left ventricle has no regional wall motion abnormalities. The average left ventricular global longitudinal strain is -17.1 %. Strain was performed and the global longitudinal strain is normal. The left ventricular internal cavity size was normal in size. There is no left ventricular hypertrophy. Left ventricular diastolic parameters are consistent with Grade I diastolic dysfunction (impaired relaxation). Right Ventricle: The right ventricular size is normal. No increase in right ventricular wall thickness. Right ventricular systolic function is normal. There is normal pulmonary artery systolic pressure. The tricuspid regurgitant velocity is 2.43 m/s, and  with an assumed right atrial pressure of 3 mmHg, the estimated right ventricular systolic pressure is 26.6 mmHg. Left Atrium: Left atrial size was mildly dilated. Right Atrium: Right atrial size was mild to moderately dilated. Pericardium: There is no evidence of pericardial effusion. Mitral Valve: The mitral valve is abnormal. There is mild thickening of the mitral valve leaflet(s). There is mild calcification of the mitral valve leaflet(s). Mild mitral annular calcification. Trivial mitral valve regurgitation. No evidence of mitral valve stenosis. Tricuspid Valve: The tricuspid valve is normal in structure. Tricuspid valve regurgitation is mild . No evidence of tricuspid stenosis. Aortic Valve: The aortic valve was not well visualized. There is moderate calcification of the aortic valve. Aortic valve regurgitation is mild. Aortic regurgitation PHT measures 408  msec. Mild aortic stenosis is present. Aortic valve mean gradient measures 11.0 mmHg. Aortic valve peak gradient measures 17.5 mmHg. Aortic valve area, by VTI measures 1.59 cm. Pulmonic Valve: The pulmonic valve was normal in structure. Pulmonic valve regurgitation is trivial. No evidence of pulmonic stenosis. Aorta: Aortic dilatation noted. There is borderline dilatation of the ascending aorta, measuring 38 mm. Venous: The inferior vena cava is normal in size with greater than 50% respiratory variability, suggesting right atrial pressure of 3 mmHg. IAS/Shunts: The interatrial septum is aneurysmal. No atrial level shunt detected by color flow Doppler. Additional Comments: 3D was performed not requiring image post processing on an independent workstation and was normal.  LEFT VENTRICLE PLAX 2D LVIDd:         3.90 cm   Diastology LVIDs:         2.90 cm   LV e' medial:    3.92 cm/s LV PW:         0.80 cm   LV E/e' medial:  14.2 LV IVS:  0.90 cm   LV e' lateral:   5.33 cm/s LVOT diam:     1.80 cm   LV E/e' lateral: 10.4 LV SV:         81 LV SV Index:   43        2D Longitudinal Strain LVOT Area:     2.54 cm  2D Strain GLS (A4C):   -19.0 % LV IVRT:       137 msec  2D Strain GLS (A3C):   -15.9 %                          2D Strain GLS (A2C):   -16.3 %                          2D Strain GLS Avg:     -17.1 %                           3D Volume EF:                          3D EF:        64 %                          LV EDV:       85 ml                          LV ESV:       31 ml                          LV SV:        54 ml RIGHT VENTRICLE RV Basal diam:  3.80 cm     PULMONARY VEINS RV S prime:     11.30 cm/s  A Reversal Velocity: 36.70 cm/s TAPSE (M-mode): 2.1 cm      Diastolic Velocity:  36.20 cm/s RVSP:           26.6 mmHg   S/D Velocity:        1.30                             Systolic Velocity:   48.30 cm/s LEFT ATRIUM             Index        RIGHT ATRIUM           Index LA diam:        3.20 cm 1.71 cm/m   RA  Pressure: 3.00 mmHg LA Vol (A2C):   53.6 ml 28.59 ml/m  RA Area:     20.90 cm LA Vol (A4C):   54.6 ml 29.12 ml/m  RA Volume:   65.40 ml  34.88 ml/m LA Biplane Vol: 58.9 ml 31.41 ml/m  AORTIC VALVE AV Area (Vmax):    1.56 cm AV Area (Vmean):   1.44 cm AV Area (VTI):     1.59 cm AV Vmax:           209.00 cm/s AV Vmean:          161.000 cm/s AV VTI:            0.507 m AV Peak Grad:  17.5 mmHg AV Mean Grad:      11.0 mmHg LVOT Vmax:         128.00 cm/s LVOT Vmean:        91.000 cm/s LVOT VTI:          0.317 m LVOT/AV VTI ratio: 0.63 AI PHT:            408 msec  AORTA Ao Root diam: 3.30 cm Ao Asc diam:  3.80 cm MITRAL VALVE                TRICUSPID VALVE MV Area (PHT):              TR Peak grad:   23.6 mmHg MV Decel Time:              TR Vmax:        243.00 cm/s MV E velocity: 55.50 cm/s   Estimated RAP:  3.00 mmHg MV A velocity: 123.00 cm/s  RVSP:           26.6 mmHg MV E/A ratio:  0.45                             SHUNTS                             Systemic VTI:  0.32 m                             Systemic Diam: 1.80 cm Toribio Fuel MD Electronically signed by Toribio Fuel MD Signature Date/Time: 03/27/2024/7:19:13 PM    Final    Assessment and Plan:  Paroxysmal A-fib with RVR Known history of PAF Repeatedly refused anticoagulation as an outpatient Home medications: Diltiazem  360 mg daily Woke around 3 AM with sudden onset palpitations, shortness of breath Took 120 mg of oral diltiazem , received IV 20 mg diltiazem  by EMS Started on IV diltiazem  drip while in the ER Currently in A-Fib with HR 90-100s Currently on IV diltiazem  at 5 mg/hr Patient has repeatedly refused anticoagulation in the past as well as today, she understands the stroke risk with A-fib Will discuss with MD but suspect she would benefit with admission to medicine service for observation, to ensure appropriate rate control as well as BP control prior to discharge  Hypertension She reports that when she had her episode  of A-fib this time around SBP was 180-200 Typically when she has bouts of atrial fibrillation she does not have elevated BP as well Home meds: Diltiazem  360 mg daily Started on IV diltiazem  Most recent BP 115/89 Reports mild headache currently  Recommend medicine admission as above  Chest discomfort Reported chest discomfort 5/10 that radiated to her back Troponin mildly elevated and flat 27 ? 27 LHC 03/2020: Normal coronary arteries, normal LV function Suspect troponin elevation in setting of A-Fib RVR Recent echo showed normal LVEF, no RWMA Nonischemic EKG  Mild aortic regurgitation Mild aortic stenosis Noted on echo February 2026 AVA by VTI 1.59 cm, mean gradient 11 mmHg Denies any symptoms when not in atrial fibrillation Continue to follow as an outpatient  Per primary Diabetes Chronic pain Fibromyalgia CKD stage IIIb Gout Hyperlipidemia Asthma  Risk Assessment/Risk Scores:     CHA2DS2-VASc Score = 5   This indicates a 7.2% annual risk of stroke. The patient's score is based upon: CHF History: 0  HTN History: 1 Diabetes History: 1 Stroke History: 0 Vascular Disease History: 0 Age Score: 2 Gender Score: 1     For questions or updates, please contact [facility] Please consult [email or web address] for contact info under   Signed, Waddell DELENA Donath, PA-C  03/29/2024 11:22 AM  Personally seen and examined. Agree with above.  Recommend admission to hospitalist team   Resting fairly comfortably.  No current chest pain.  Heart irregularly irregular tachycardic, alert and orient x 3, lungs are clear  X-ray chest personally reviewed, unremarkable  Currently on IV diltiazem  for rate control of atrial fibrillation. Highly elevated blood pressure has improved.  When she demonstrates stability, convert IV diltiazem  over to long-acting Cardizem .  She is adamant against anticoagulation.  Understands stroke risk.  She has been counseled as well on Watchman  device.  Mild aortic stenosis and regurgitation should be of little clinical concern at this point.  Oneil Parchment, MD     [1]  Allergies Allergen Reactions   Benazepril  Cough   Lopressor  [Metoprolol ] Other (See Comments)    nervousness   Adhesive [Tape] Other (See Comments)    redness   Morphine  Other (See Comments)       Makes you feel bad all over   Nifedipine Hives and Itching   Piroxicam Hives and Itching   "

## 2024-03-29 NOTE — Assessment & Plan Note (Signed)
 Chest pain the setting of A-fib with RVR -Mildly elevated troponin remained flat at 27, 27 -Continue as needed nitroglycerin , aspirin , statins, As needed analgesics  - Echo reviewed EF 60-65%, grade 1 diastolic dysfunction with minimal structural abnormalities -Cardiology following, appreciate input

## 2024-03-29 NOTE — ED Notes (Signed)
 CCMD contacted.

## 2024-03-29 NOTE — Progress Notes (Signed)
 PHARMACY - ANTICOAGULATION CONSULT NOTE  Pharmacy Consult for Apixaban  Indication: atrial fibrillation  Allergies[1]  Patient Measurements: Height: 5' 3 (160 cm) Weight: 84 kg (185 lb 3 oz) IBW/kg (Calculated) : 52.4 HEPARIN  DW (KG): 71  Vital Signs: Temp: 97.8 F (36.6 C) (02/05 1356) Temp Source: Oral (02/05 1356) BP: 136/96 (02/05 1545) Pulse Rate: 68 (02/05 1545)  Labs: Recent Labs    03/29/24 0518 03/29/24 0631  HGB 14.5  --   HCT 42.1  --   PLT 191  --   LABPROT  --  13.0  INR  --  0.9  CREATININE 1.32*  --     Estimated Creatinine Clearance: 34.3 mL/min (A) (by C-G formula based on SCr of 1.32 mg/dL (H)).   Medical History: Past Medical History:  Diagnosis Date   Allergic rhinitis    Allergy    Anxiety    and panic attacks;pt states that she is claustrophobic   Asthma    Atrial fibrillation (HCC)    Used to take Coumadin -instructed to stop   Cataract    bilateral surgery to remove   Chronic back pain    hx buldging disc   Colon cancer (HCC) 2007   s/p surgery and chemo   Constipation    takes Colace prn   Diabetes mellitus    glimepiride    DJD (degenerative joint disease)    Dyspnea    Dysrhythmia    Fibromyalgia    but doesn't take any meds   Gastric ulcer    GERD (gastroesophageal reflux disease)    uses Tums prn   Gout    takes Colchicine  prn   Heart murmur    History of blood clots    right lung in early 90's   History of colon polyps    History of migraine headaches    last migraine 71yrs ago;Pt states she does get sinus headaches   History of PSVT (paroxysmal supraventricular tachycardia) no current problems   History of shingles    HLD (hyperlipidemia)    diet control - no meds   Hypertension    takes Micardis  and Diltiazem  daily   Osteoarthritis    Peptic ulcer disease    Peripheral edema    takes Furosemide  every other day   Sleep apnea    doesn't use CPAP   Assessment: 63 yof with a history of AF not on AC pta,  HTN, HLD, T2DM, remote PE, CKD presenting with SOB and palpitations. Apixaban  per pharmacy consult placed for AF indication.  Patient reportedly initially not agreeable to anticoagulation but after discussion with Dr. Willette is agreeable to eliquis .  Apixaban  Dose Reduction Criteria (2 of 3 needed for reduction) Age > 80 - Yes Scr > 1.5 - No 1.32 Wt < 60 kg: No  Hgb 14.5; plt 191 PT/INR 13/0.9  Goal of Therapy:  Monitor platelets by anticoagulation protocol: Yes   Plan:  Begin apixaban  5mg  BID --Closely monitor for dose reduction criteria Monitor CBC, renal function, and for s/s of hemorrhage Run price checks Educate on use prior to discharge  Dorn Buttner, PharmD, BCPS 03/29/2024 4:09 PM ED Clinical Pharmacist -  [phone]       [1]  Allergies Allergen Reactions   Benazepril  Cough   Lopressor  [Metoprolol ] Other (See Comments)    nervousness   Adhesive [Tape] Other (See Comments)    redness   Morphine  Other (See Comments)       Makes you feel bad all over   Nifedipine Hives  and Itching   Piroxicam Hives and Itching

## 2024-03-29 NOTE — Assessment & Plan Note (Signed)
 Now persistent A-fib On Cardizem  drip to be converted to p.o. in next 24 hours Continue discussion of pros and cons of chronic anticoagulation

## 2024-03-29 NOTE — Evaluation (Signed)
 RT Evaluate and Treat Note  03/29/2024   Breathing is (select one): Same as normal    The following was found on auscultation (select multiple):  Bilateral Breath Sounds: Clear (03/29/24 1634)             Cough Assessment:      Most Recent Chest Xray:... (DG Chest Portable 1 View Result Date: 03/29/2024 CLINICAL DATA:  Shortness of breath and chest pain. EXAM: PORTABLE CHEST 1 VIEW COMPARISON:  10/11/2023 FINDINGS: The lungs are clear without focal pneumonia, edema, pneumothorax or pleural effusion. Cardiopericardial silhouette is at upper limits of normal for size. No acute bony abnormality. Telemetry leads overlie the chest. IMPRESSION: No active disease. Electronically Signed   By: Camellia Candle M.D.   On: 03/29/2024 05:46      The following medications and/or interventions were ordered/changed/discontinued as part of the Respiratory Treatment protocol:   Medication Changes: No Change   Airway Clearance Changes: No Change   Oxygen Therapy Changes:No Change

## 2024-03-29 NOTE — ED Provider Notes (Signed)
" °  Physical Exam  BP 134/81   Pulse 70   Temp 98 F (36.7 C) (Oral)   Resp 13   Ht 1.6 m (5' 3)   Wt 84 kg   LMP 12/30/2000   SpO2 96%   BMI 32.80 kg/m   Physical Exam  Procedures  Procedures  ED Course / MDM    Medical Decision Making Amount and/or Complexity of Data Reviewed Labs: ordered. Radiology: ordered.  Risk Prescription drug management. Decision regarding hospitalization.   84 yo female ho afib, hr in 180s on ems arrival.  Off meds, dilt started in field On dilt now hr 70s Anticoaguation dw cards and they will advise Cardiology consult notes IV Cardizem  until stable enough to transfer to oral Cardizem . Consulted with hospitalist who will see for admission       Levander Houston, MD 03/29/24 1512  "

## 2024-03-29 NOTE — Assessment & Plan Note (Addendum)
 Monitor BUN/creatinine closely - Currently BUN 31, creatinine 1.32, GFR of 40 -Avoiding nephrotoxins, hypotension,

## 2024-03-29 NOTE — H&P (Signed)
 " History and Physical   Patient: [name]               PCP: Rollene Almarie LABOR, MD                    DOB: 1940-09-05            DOA: 03/29/2024 FMW:[id number]             DOS: 03/29/2024, 4:07 PM  Rollene Almarie LABOR, MD  Patient coming from:   HOME  I have personally reviewed patient's medical records, in electronic medical records, including:  [place] [facility], and care everywhere.    Chief Complaint:   Chief Complaint  Patient presents with   Palpitations    History of present illness:    Julie DERCOLE is a 84-year-old female with extensive history of paroxysmal A-fib, HTN, HLD, DM2, asthma, gout, OSA, remote PE in 1990s, CKD stage IIIb, chronic pain, for myalgia... Presented last night with palpitation, shortness of breath.. Her symptoms woke her up from sleep in the middle of the night with sudden palpitation, shortness of breath. On arrival rated pain 5 out of 10, with radiation to the back.  She took her diltiazem , aspirin  where she had some improvement.   ED Evaluation POA: HR RN 20s, was started on a Cardizem  drip Blood pressure 136/74, pulse 69, temperature 97.8 F (36.6 C), temperature source Oral, resp. rate 15, height 5' 3 (1.6 m), weight 84 kg, last menstrual period 12/30/2000, SpO2 96%.  LABs: Glucose 113, BUN 31, creatinine 1.32, GFR 40, troponin 27, 27, CBC within normal limits,   Patient has been started on Cardizem  drip.  Cardiology requested [facility] hospitalist to admit.    Patient Denies having: Fever, Chills, Cough, SOB, Chest Pain, Abd pain, N/V/D, headache, dizziness, lightheadedness,  Dysuria, Joint pain, rash, open wounds   Review of Systems: As per HPI, otherwise 10 point review of systems were negative.   ----------------------------------------------------------------------------------------------------------------------  Allergies[1]  Home MEDs:  Prior to Admission medications  Medication Sig Start Date End Date Taking? Authorizing  Provider  albuterol  (VENTOLIN  HFA) 108 (90 Base) MCG/ACT inhaler Inhale 2 puffs into the lungs every 6 (six) hours as needed for wheezing or shortness of breath. 07/05/22  Yes Rollene Almarie LABOR, MD  aspirin  EC 81 MG tablet Take 81 mg by mouth daily.   Yes [provider]  diltiazem  (CARDIZEM  CD) 120 MG 24 hr capsule TAKE 1 CAP DAILY W/DILTIAZEM  240MG  TO EQUAL 360MG /DAY. MAY TAKE 1 EXTRA CAP AS NEEDED FOR BREAK THRU AFIB 02/06/24  Yes Patwardhan, Manish J, MD  diltiazem  (CARDIZEM  CD) 240 MG 24 hr capsule TAKE 1 CAPSULE EVERY DAY IN ADDITION TO DILTIAZEM  120MG  FOR TOTAL OF 360MG  DAILY 02/13/24  Yes Okey [name], MD  losartan  (COZAAR ) 100 MG tablet TAKE 1 TABLET EVERY DAY 01/06/24  Yes Okey [name], MD  ondansetron  (ZOFRAN ) 4 MG tablet Take 1 tablet (4 mg total) by mouth every 8 (eight) hours as needed for nausea or vomiting. 12/26/23  Yes Rollene Almarie LABOR, MD  pravastatin  (PRAVACHOL ) 20 MG tablet Take 1 tablet (20 mg total) by mouth daily. 01/04/24  Yes Rollene Almarie LABOR, MD  sodium chloride  (OCEAN) 0.65 % SOLN nasal spray Place 1 spray into both nostrils as needed for congestion.   Yes [provider]  traMADol  (ULTRAM ) 50 MG tablet Take 1 tablet (50 mg total) by mouth daily as needed. 12/26/23  Yes Rollene Almarie LABOR, MD  PRN MEDs: acetaminophen  **OR** acetaminophen , hydrALAZINE , HYDROmorphone  (DILAUDID ) injection, ipratropium, ondansetron  **OR** ondansetron  (ZOFRAN ) IV, oxyCODONE , senna-docusate, sodium phosphate , traZODone   Past Medical History:  Diagnosis Date   Allergic rhinitis    Allergy    Anxiety    and panic attacks;pt states that she is claustrophobic   Asthma    Atrial fibrillation (HCC)    Used to take Coumadin -instructed to stop   Cataract    bilateral surgery to remove   Chronic back pain    hx buldging disc   Colon cancer (HCC) 2007   s/p surgery and chemo   Constipation    takes Colace prn   Diabetes mellitus    glimepiride    DJD  (degenerative joint disease)    Dyspnea    Dysrhythmia    Fibromyalgia    but doesn't take any meds   Gastric ulcer    GERD (gastroesophageal reflux disease)    uses Tums prn   Gout    takes Colchicine  prn   Heart murmur    History of blood clots    right lung in early 90's   History of colon polyps    History of migraine headaches    last migraine 43yrs ago;Pt states she does get sinus headaches   History of PSVT (paroxysmal supraventricular tachycardia) no current problems   History of shingles    HLD (hyperlipidemia)    diet control - no meds   Hypertension    takes Micardis  and Diltiazem  daily   Osteoarthritis    Peptic ulcer disease    Peripheral edema    takes Furosemide  every other day   Sleep apnea    doesn't use CPAP    Past Surgical History:  Procedure Laterality Date   ANKLE SURGERY     tumor-benign removed left ankle   ANTERIOR CERVICAL DECOMP/DISCECTOMY FUSION  01/12/2011   Procedure: ANTERIOR CERVICAL DECOMPRESSION/DISCECTOMY FUSION 2 LEVELS;  Surgeon: Victory LABOR Pool;  Location: [facility];  Service: Neurosurgery;  Laterality: Bilateral;  Cervical five-six, six-seven anterior cervical discectomy and fusion with allograft and plating   ANTERIOR CERVICAL DECOMP/DISCECTOMY FUSION  06/11/2011   Procedure: ANTERIOR CERVICAL DECOMPRESSION/DISCECTOMY FUSION 1 LEVEL/HARDWARE REMOVAL;  Surgeon: Victory LABOR Gunnels, MD;  Location: [facility];  Service: Neurosurgery;  Laterality: N/A;  Cervical four-five anterior cervical decompression fusion with allograft and plating; Removal of Cervical five to seven plate   bilateral cataracts removed     caesarean section  1966/69/72   x 3   CARDIAC CATHETERIZATION  90's and 2005   CARPAL TUNNEL RELEASE Left 11/2013   cataract surgery  2010   COLECTOMY  2007   colon cancer   COLONOSCOPY     DILATION AND CURETTAGE OF UTERUS  02-19-11   & POLYP REMOVAL   GANGLION CYST EXCISION  > 28yrs ago   removed from left pointer finger   HEEL  SPUR SURGERY Right 1992   x 2   HYSTEROSCOPY WITH D & C  02/19/2011   Procedure: DILATATION AND CURETTAGE /HYSTEROSCOPY;  Surgeon: Evalene SHAUNNA Organ, MD;  Location: [facility];  Service: Gynecology;  Laterality: N/A;  requests one hour   JOINT REPLACEMENT Left    knee   knee arthrosocpy     bil;couple of years before knee replacement   KNEE SURGERY     left TKA   LEFT HEART CATH AND CORONARY ANGIOGRAPHY N/A 04/21/2020   Procedure: LEFT HEART CATH AND CORONARY ANGIOGRAPHY;  Surgeon: Court Dorn PARAS, MD;  Location: [facility]  INVASIVE CV LAB;  Service: Cardiovascular;  Laterality: N/A;   LUMBAR DISC SURGERY  2008   port a cath placed  2008   port a cath removed  2008   pyelonidal cystectomy     at age 45   SHOULDER ARTHROSCOPY WITH SUBACROMIAL DECOMPRESSION AND OPEN ROTATOR C Right 03/16/2013   Procedure: RIGHT SHOULDER ARTHROSCOPY WITH SUBACROMIAL DECOMPRESSION AND OPEN ROTATOR CUFF REPAIR, OPEN DISTAL CLAVICLE  RESECTION POSSIBLE BICEP TENODESIS ;  Surgeon: Elspeth JONELLE Her, MD;  Location: [facility] OR;  Service: Orthopedics;  Laterality: Right;   TOTAL KNEE ARTHROPLASTY Right 11/02/2022   Procedure: TOTAL KNEE ARTHROPLASTY;  Surgeon: Ernie Cough, MD;  Location: [facility];  Service: Orthopedics;  Laterality: Right;   TRIGGER FINGER RELEASE Left    thumb, tendonitis, tumor removed   TUBAL LIGATION  1972     reports that she quit smoking about 33 years ago. Her smoking use included cigarettes. She started smoking about 63 years ago. She has a 45 pack-year smoking history. She has never used smokeless tobacco. She reports that she does not drink alcohol and does not use drugs.   Family History  Problem Relation Age of Onset   Heart attack Mother    Stroke Mother    Hypertension Mother    Heart disease Father    Hypertension Sister    Kidney cancer Sister        left   Bladder Cancer Brother    Liver cancer Brother        spread to colon   Coronary artery disease Other    Anesthesia problems Neg Hx     Hypotension Neg Hx    Malignant hyperthermia Neg Hx    Pseudochol deficiency Neg Hx    Stomach cancer Neg Hx    Rectal cancer Neg Hx     Physical Exam:   Vitals:   03/29/24 1500 03/29/24 1515 03/29/24 1530 03/29/24 1545  BP: 131/75 123/66 138/76 (!) 136/96  Pulse: 69 78 65 68  Resp: 18 19 10 15   Temp:      TempSrc:      SpO2: 97% 96% 96% 97%  Weight:      Height:       Constitutional: NAD, calm, comfortable Eyes: PERRL, lids and conjunctivae normal ENMT: Mucous membranes are moist. Posterior pharynx clear of any exudate or lesions.Normal dentition.  Neck: normal, supple, no masses, no thyromegaly Respiratory: clear to auscultation bilaterally, no wheezing, no crackles. Normal respiratory effort. No accessory muscle use.  Cardiovascular: Irregularly irregular,  no murmurs / rubs / gallops. No extremity edema. 2+ pedal pulses. No carotid bruits.  Abdomen: no tenderness, no masses palpated. No hepatosplenomegaly. Bowel sounds positive.  Musculoskeletal: no clubbing / cyanosis. No joint deformity upper and lower extremities. Good ROM, no contractures. Normal muscle tone.  Neurologic: CN II-XII grossly intact. Sensation intact, DTR normal. Strength 5/5 in all 4.  Psychiatric: Normal judgment and insight. Alert and oriented x 3. Normal mood.  Skin: no rashes, lesions, ulcers. No induration     Labs on admission:    I have personally reviewed following labs and imaging studies  CBC: Recent Labs  Lab 03/29/24 0518  WBC 7.9  NEUTROABS 4.0  HGB 14.5  HCT 42.1  MCV 89.8  PLT 191   Basic Metabolic Panel: Recent Labs  Lab 03/29/24 0518  NA 140  K 3.9  CL 104  CO2 23  GLUCOSE 113*  BUN 31*  CREATININE 1.32*  CALCIUM  9.9  MG  2.2   GFR: Estimated Creatinine Clearance: 34.3 mL/min (A) (by C-G formula based on SCr of 1.32 mg/dL (H)). Liver Function Tests: Recent Labs  Lab 03/29/24 0518  AST 22  ALT 11  ALKPHOS 104  BILITOT 0.5  PROT 6.9  ALBUMIN 4.4   No  results for input(s): LIPASE, AMYLASE in the last 168 hours. No results for input(s): AMMONIA in the last 168 hours. Coagulation Profile: Recent Labs  Lab 03/29/24 0631  INR 0.9   Urine analysis:    Component Value Date/Time   COLORURINE YELLOW 05/22/2015 2053   APPEARANCEUR CLOUDY (A) 05/22/2015 2053   LABSPEC 1.023 05/22/2015 2053   PHURINE 5.5 05/22/2015 2053   GLUCOSEU 100 (A) 05/22/2015 2053   GLUCOSEU NEGATIVE 12/25/2008 1428   HGBUR SMALL (A) 05/22/2015 2053   BILIRUBINUR Neg 08/10/2018 1449   KETONESUR NEGATIVE 05/22/2015 2053   PROTEINUR Positive (A) 08/10/2018 1449   PROTEINUR NEGATIVE 05/22/2015 2053   UROBILINOGEN 0.2 08/10/2018 1449   UROBILINOGEN 0.2 06/01/2014 0739   NITRITE Neg 08/10/2018 1449   NITRITE NEGATIVE 05/22/2015 2053   LEUKOCYTESUR Negative 08/10/2018 1449    Last A1C:  Lab Results  Component Value Date   HGBA1C 6.4 12/26/2023     Radiologic Exams on Admission:   DG Chest Portable 1 View Result Date: 03/29/2024 CLINICAL DATA:  Shortness of breath and chest pain. EXAM: PORTABLE CHEST 1 VIEW COMPARISON:  10/11/2023 FINDINGS: The lungs are clear without focal pneumonia, edema, pneumothorax or pleural effusion. Cardiopericardial silhouette is at upper limits of normal for size. No acute bony abnormality. Telemetry leads overlie the chest. IMPRESSION: No active disease. Electronically Signed   By: Camellia Candle M.D.   On: 03/29/2024 05:46    EKG:   Independently reviewed.  Orders placed or performed during the hospital encounter of 03/29/24   ED EKG   ED EKG   EKG 12-Lead   ---------------------------------------------------------------------------------------------------------------------------------------    Assessment / Plan:   Principal Problem:   Atrial fibrillation with rapid ventricular response (HCC) Active Problems:   Essential hypertension   Hyperlipidemia associated with type 2 diabetes mellitus (HCC)   Chest pain    PAF (paroxysmal atrial fibrillation) (HCC)   Stage 3b chronic kidney disease (HCC)   Type 2 diabetes mellitus with hyperlipidemia (HCC)   Gouty arthritis of left foot   SLEEP APNEA, OBSTRUCTIVE   Fibromyalgia   Chronic back pain   Assessment and Plan: * Atrial fibrillation with rapid ventricular response (HCC) POA:  heart rate> 120, started on Cardizem  drip -Cardiology consulted - Holding home medications of p.o. Cardizem  -Continue to titrate for heart rate 90-100 -Apparently patient has refused anticoagulation in the past, currently not on heparin  drip or oral anticoagulation -appreciate cardiology input _Chad Vascor > 5.0  Pros and cons of anticoagulation including risk of stroke discussed. (Did not want Coumadin  -could not tolerate in the past) Patient agreed to start on Eliquis    - Echo: 03/27/2024: EF 60-65%, normal LV function, grade 1 diastolic dysfunction, negative structural or valvular abnormalities, with exception of moderate aortic calcification, mild regurgitation, stenosis  - Will continue aspirin , statins,  Stage 3b chronic kidney disease (HCC) Monitor BUN/creatinine closely - Currently BUN 31, creatinine 1.32, GFR of 40 -Avoiding nephrotoxins, hypotension,  PAF (paroxysmal atrial fibrillation) (HCC) Now persistent A-fib On Cardizem  drip to be converted to p.o. in next 24 hours Continue discussion of pros and cons of chronic anticoagulation   Chest pain Chest pain the setting of A-fib with RVR -Mildly elevated  troponin remained flat at 27, 27 -Continue as needed nitroglycerin , aspirin , statins, As needed analgesics  - Echo reviewed EF 60-65%, grade 1 diastolic dysfunction with minimal structural abnormalities -Cardiology following, appreciate input  Hyperlipidemia associated with type 2 diabetes mellitus (HCC) Continue pravastatin   Essential hypertension - Elevated BP, currently on Cardizem  drip - Home medication includes p.o. Cardizem , losartan  -  Restarting losartan  at 50 mg p.o. daily - Monitoring BP closely on Cardizem  drip  Gouty arthritis of left foot Continue colchicine  as needed Not on allopurinol  Type 2 diabetes mellitus with hyperlipidemia (HCC) Last A1c 6.4 -Currently patient is not on any medication -Monitoring CBG as needed -Recommended patient to be on low-dose glipizide  Chronic back pain Chronic back pain with a history of fibromyalgia -Continue as needed analgesics-Home medication include tramadol    Fibromyalgia Stable continue as needed analgesics  SLEEP APNEA, OBSTRUCTIVE Monitoring continue supplemental oxygen     Consults called:  Cardiology -------------------------------------------------------------------------------------------------------------------------------------------- DVT prophylaxis:  heparin  injection 5,000 Units Start: 03/29/24 1530 SCDs Start: 03/29/24 1518 Place TED hose Start: 03/29/24 1518   Code Status:   Code Status: Full Code   Admission status: Patient will be admitted as Observation, with a greater than 2 midnight length of stay. Level of care: Telemetry   Family Communication:  none at bedside  (The above findings and plan of care has been discussed with patient in detail, the patient expressed understanding and agreement of above plan)  --------------------------------------------------------------------------------------------------------------------------------------------------  Disposition Plan:  Anticipated 1-2 days Status is: Observation The patient remains OBS appropriate and will d/c before 2 midnights.     ----------------------------------------------------------------------------------------------------------------------------------------------------  Time spent:  32  Min.  Was spent seeing and evaluating the patient, reviewing all medical records, drawn plan of care.  SIGNED: Adriana DELENA Grams, MD, FHM. FAAFP. [place] [facility],  Pager  (Please use [email or web address] to page/ or secure chat through epic) If 7PM-7AM, please contact night-coverage [email or web address],  03/29/2024, 4:07 PM     [1]  Allergies Allergen Reactions   Benazepril  Cough   Lopressor  [Metoprolol ] Other (See Comments)    nervousness   Adhesive [Tape] Other (See Comments)    redness   Morphine  Other (See Comments)       Makes you feel bad all over   Nifedipine Hives and Itching   Piroxicam Hives and Itching   "

## 2024-03-29 NOTE — Assessment & Plan Note (Signed)
 Last A1c 6.4 -Currently patient is not on any medication -Monitoring CBG as needed -Recommended patient to be on low-dose glipizide

## 2024-07-12 ENCOUNTER — Ambulatory Visit
